# Patient Record
Sex: Male | Born: 1935
Health system: Southern US, Community
[De-identification: ages and names within clinical notes are randomized; demographics above are authoritative.]

## PROBLEM LIST (undated history)

## (undated) DIAGNOSIS — I4891 Unspecified atrial fibrillation: Secondary | ICD-10-CM

## (undated) DIAGNOSIS — D649 Anemia, unspecified: Secondary | ICD-10-CM

## (undated) DIAGNOSIS — N138 Other obstructive and reflux uropathy: Secondary | ICD-10-CM

## (undated) DIAGNOSIS — I2510 Atherosclerotic heart disease of native coronary artery without angina pectoris: Secondary | ICD-10-CM

## (undated) DIAGNOSIS — K4090 Unilateral inguinal hernia, without obstruction or gangrene, not specified as recurrent: Secondary | ICD-10-CM

## (undated) DIAGNOSIS — IMO0002 Reserved for concepts with insufficient information to code with codable children: Secondary | ICD-10-CM

## (undated) DIAGNOSIS — K589 Irritable bowel syndrome without diarrhea: Secondary | ICD-10-CM

## (undated) DIAGNOSIS — K5790 Diverticulosis of intestine, part unspecified, without perforation or abscess without bleeding: Secondary | ICD-10-CM

## (undated) DIAGNOSIS — Z9289 Personal history of other medical treatment: Secondary | ICD-10-CM

## (undated) DIAGNOSIS — I4949 Other premature depolarization: Secondary | ICD-10-CM

## (undated) DIAGNOSIS — E782 Mixed hyperlipidemia: Secondary | ICD-10-CM

## (undated) DIAGNOSIS — R519 Headache, unspecified: Secondary | ICD-10-CM

## (undated) DIAGNOSIS — Z5189 Encounter for other specified aftercare: Secondary | ICD-10-CM

## (undated) DIAGNOSIS — R110 Nausea: Secondary | ICD-10-CM

## (undated) DIAGNOSIS — G8929 Other chronic pain: Secondary | ICD-10-CM

## (undated) DIAGNOSIS — K219 Gastro-esophageal reflux disease without esophagitis: Secondary | ICD-10-CM

## (undated) DIAGNOSIS — R109 Unspecified abdominal pain: Secondary | ICD-10-CM

## (undated) DIAGNOSIS — F419 Anxiety disorder, unspecified: Secondary | ICD-10-CM

## (undated) DIAGNOSIS — R1013 Epigastric pain: Secondary | ICD-10-CM

## (undated) DIAGNOSIS — Z9889 Other specified postprocedural states: Secondary | ICD-10-CM

## (undated) DIAGNOSIS — R112 Nausea with vomiting, unspecified: Secondary | ICD-10-CM

## (undated) DIAGNOSIS — H269 Unspecified cataract: Secondary | ICD-10-CM

## (undated) DIAGNOSIS — N401 Enlarged prostate with lower urinary tract symptoms: Secondary | ICD-10-CM

## (undated) DIAGNOSIS — L439 Lichen planus, unspecified: Secondary | ICD-10-CM

## (undated) DIAGNOSIS — R51 Headache: Secondary | ICD-10-CM

## (undated) HISTORY — DX: Atherosclerotic heart disease of native coronary artery without angina pectoris: I25.10

## (undated) HISTORY — DX: Encounter for other specified aftercare: Z51.89

## (undated) HISTORY — DX: Other obstructive and reflux uropathy: N40.1

## (undated) HISTORY — DX: Headache: R51

## (undated) HISTORY — DX: Unspecified cataract: H26.9

## (undated) HISTORY — DX: Lichen planus, unspecified: L43.9

## (undated) HISTORY — DX: Personal history of other medical treatment: Z92.89

## (undated) HISTORY — DX: Other premature depolarization: I49.49

## (undated) HISTORY — DX: Other obstructive and reflux uropathy: N13.8

## (undated) HISTORY — DX: Unilateral inguinal hernia, without obstruction or gangrene, not specified as recurrent: K40.90

## (undated) HISTORY — DX: Age-related osteoporosis without current pathological fracture: M81.0

## (undated) HISTORY — DX: Anemia, unspecified: D64.9

## (undated) HISTORY — DX: Anxiety disorder, unspecified: F41.9

## (undated) HISTORY — DX: Gastro-esophageal reflux disease without esophagitis: K21.9

## (undated) HISTORY — DX: Mixed hyperlipidemia: E78.2

## (undated) HISTORY — DX: Reserved for concepts with insufficient information to code with codable children: IMO0002

## (undated) HISTORY — DX: Unspecified atrial fibrillation: I48.91

## (undated) HISTORY — DX: Diverticulosis of intestine, part unspecified, without perforation or abscess without bleeding: K57.90

## (undated) HISTORY — DX: Headache, unspecified: R51.9

## (undated) HISTORY — PX: CORONARY ARTERY BYPASS GRAFT: SHX141

## (undated) HISTORY — DX: Irritable bowel syndrome, unspecified: K58.9

---

## 1975-11-28 HISTORY — PX: KNEE SURGERY: SHX244

## 1998-11-16 ENCOUNTER — Encounter (INDEPENDENT_AMBULATORY_CARE_PROVIDER_SITE_OTHER): Payer: Self-pay | Admitting: *Deleted

## 2001-01-20 ENCOUNTER — Encounter: Payer: Self-pay | Admitting: Emergency Medicine

## 2001-01-20 ENCOUNTER — Inpatient Hospital Stay (HOSPITAL_COMMUNITY): Admission: EM | Admit: 2001-01-20 | Discharge: 2001-01-23 | Payer: Self-pay | Admitting: Emergency Medicine

## 2001-01-22 ENCOUNTER — Encounter: Payer: Self-pay | Admitting: Internal Medicine

## 2003-10-07 ENCOUNTER — Encounter: Payer: Self-pay | Admitting: Internal Medicine

## 2004-10-27 ENCOUNTER — Ambulatory Visit: Payer: Self-pay | Admitting: Internal Medicine

## 2004-10-31 ENCOUNTER — Encounter: Admission: RE | Admit: 2004-10-31 | Discharge: 2004-10-31 | Payer: Self-pay | Admitting: Internal Medicine

## 2004-11-27 DIAGNOSIS — L439 Lichen planus, unspecified: Secondary | ICD-10-CM

## 2004-11-27 HISTORY — DX: Lichen planus, unspecified: L43.9

## 2005-06-05 ENCOUNTER — Ambulatory Visit: Payer: Self-pay | Admitting: Internal Medicine

## 2005-06-23 ENCOUNTER — Ambulatory Visit: Payer: Self-pay | Admitting: Internal Medicine

## 2006-04-27 ENCOUNTER — Ambulatory Visit: Payer: Self-pay | Admitting: Internal Medicine

## 2006-05-21 ENCOUNTER — Ambulatory Visit: Payer: Self-pay | Admitting: Internal Medicine

## 2006-05-25 ENCOUNTER — Ambulatory Visit: Payer: Self-pay | Admitting: Internal Medicine

## 2006-06-18 ENCOUNTER — Encounter: Admission: RE | Admit: 2006-06-18 | Discharge: 2006-06-18 | Payer: Self-pay | Admitting: Internal Medicine

## 2006-06-18 ENCOUNTER — Ambulatory Visit: Payer: Self-pay | Admitting: Internal Medicine

## 2006-06-30 ENCOUNTER — Encounter: Admission: RE | Admit: 2006-06-30 | Discharge: 2006-06-30 | Payer: Self-pay | Admitting: Internal Medicine

## 2006-07-09 ENCOUNTER — Ambulatory Visit: Payer: Self-pay | Admitting: Internal Medicine

## 2006-07-18 ENCOUNTER — Ambulatory Visit: Payer: Self-pay | Admitting: Internal Medicine

## 2006-09-19 ENCOUNTER — Ambulatory Visit: Payer: Self-pay | Admitting: Internal Medicine

## 2006-09-19 LAB — CONVERTED CEMR LAB
Basophils Absolute: 0 10*3/uL (ref 0.0–0.1)
Eosinophil percent: 3.5 % (ref 0.0–5.0)
Hemoglobin: 15.2 g/dL (ref 13.0–17.0)
Lymphocytes Relative: 29.9 % (ref 12.0–46.0)
Potassium: 4.3 meq/L (ref 3.5–5.1)
RBC: 4.76 M/uL (ref 4.22–5.81)
RDW: 14.1 % (ref 11.5–14.6)
WBC: 5.8 10*3/uL (ref 4.5–10.5)

## 2006-10-11 ENCOUNTER — Ambulatory Visit: Payer: Self-pay | Admitting: Internal Medicine

## 2007-01-01 DIAGNOSIS — M810 Age-related osteoporosis without current pathological fracture: Secondary | ICD-10-CM

## 2007-03-28 HISTORY — PX: HIP FRACTURE SURGERY: SHX118

## 2007-09-20 ENCOUNTER — Ambulatory Visit: Payer: Self-pay | Admitting: Internal Medicine

## 2007-09-20 DIAGNOSIS — N401 Enlarged prostate with lower urinary tract symptoms: Secondary | ICD-10-CM

## 2007-09-20 DIAGNOSIS — N400 Enlarged prostate without lower urinary tract symptoms: Secondary | ICD-10-CM | POA: Insufficient documentation

## 2007-09-20 DIAGNOSIS — N138 Other obstructive and reflux uropathy: Secondary | ICD-10-CM

## 2007-09-20 DIAGNOSIS — S22009A Unspecified fracture of unspecified thoracic vertebra, initial encounter for closed fracture: Secondary | ICD-10-CM

## 2007-09-25 ENCOUNTER — Ambulatory Visit: Payer: Self-pay | Admitting: Internal Medicine

## 2007-09-25 ENCOUNTER — Encounter (INDEPENDENT_AMBULATORY_CARE_PROVIDER_SITE_OTHER): Payer: Self-pay | Admitting: *Deleted

## 2007-10-04 ENCOUNTER — Ambulatory Visit: Payer: Self-pay | Admitting: Family Medicine

## 2007-10-04 ENCOUNTER — Encounter: Payer: Self-pay | Admitting: Internal Medicine

## 2007-10-12 LAB — CONVERTED CEMR LAB
PSA: 1.63 ng/mL (ref 0.10–4.00)
Rhuematoid fact SerPl-aCnc: <20 intl units/mL — ABNORMAL LOW (ref 0.0–20.0)
Uric Acid, Serum: 5.4 mg/dL (ref 2.4–7.0)

## 2007-10-14 ENCOUNTER — Encounter (INDEPENDENT_AMBULATORY_CARE_PROVIDER_SITE_OTHER): Payer: Self-pay | Admitting: *Deleted

## 2007-11-07 ENCOUNTER — Telehealth (INDEPENDENT_AMBULATORY_CARE_PROVIDER_SITE_OTHER): Payer: Self-pay | Admitting: *Deleted

## 2007-11-25 ENCOUNTER — Ambulatory Visit: Payer: Self-pay | Admitting: Internal Medicine

## 2007-11-29 ENCOUNTER — Encounter (INDEPENDENT_AMBULATORY_CARE_PROVIDER_SITE_OTHER): Payer: Self-pay | Admitting: *Deleted

## 2008-03-30 ENCOUNTER — Inpatient Hospital Stay (HOSPITAL_COMMUNITY): Admission: EM | Admit: 2008-03-30 | Discharge: 2008-04-07 | Payer: Self-pay | Admitting: Emergency Medicine

## 2008-03-31 ENCOUNTER — Telehealth: Payer: Self-pay | Admitting: Internal Medicine

## 2008-05-11 ENCOUNTER — Ambulatory Visit: Payer: Self-pay | Admitting: Internal Medicine

## 2008-05-11 DIAGNOSIS — D649 Anemia, unspecified: Secondary | ICD-10-CM | POA: Insufficient documentation

## 2008-05-11 DIAGNOSIS — R002 Palpitations: Secondary | ICD-10-CM

## 2008-05-12 LAB — CONVERTED CEMR LAB
Basophils Absolute: 0.1 10*3/uL (ref 0.0–0.1)
Eosinophils Absolute: 0.9 10*3/uL — ABNORMAL HIGH (ref 0.0–0.7)
Eosinophils Relative: 10.4 % — ABNORMAL HIGH (ref 0.0–5.0)
HCT: 37.7 % — ABNORMAL LOW (ref 39.0–52.0)
Hemoglobin: 12.9 g/dL — ABNORMAL LOW (ref 13.0–17.0)
Lymphocytes Relative: 21.9 % (ref 12.0–46.0)
MCHC: 34.2 g/dL (ref 30.0–36.0)
MCV: 95.5 fL (ref 78.0–100.0)
Monocytes Absolute: 0.8 10*3/uL (ref 0.1–1.0)
Monocytes Relative: 9.6 % (ref 3.0–12.0)
RDW: 15.5 % — ABNORMAL HIGH (ref 11.5–14.6)

## 2008-05-25 ENCOUNTER — Encounter: Payer: Self-pay | Admitting: Internal Medicine

## 2008-06-27 ENCOUNTER — Encounter: Payer: Self-pay | Admitting: Internal Medicine

## 2008-06-29 ENCOUNTER — Encounter (INDEPENDENT_AMBULATORY_CARE_PROVIDER_SITE_OTHER): Payer: Self-pay | Admitting: *Deleted

## 2008-06-29 ENCOUNTER — Telehealth (INDEPENDENT_AMBULATORY_CARE_PROVIDER_SITE_OTHER): Payer: Self-pay | Admitting: *Deleted

## 2008-06-30 ENCOUNTER — Telehealth (INDEPENDENT_AMBULATORY_CARE_PROVIDER_SITE_OTHER): Payer: Self-pay | Admitting: *Deleted

## 2008-06-30 ENCOUNTER — Ambulatory Visit: Payer: Self-pay | Admitting: Internal Medicine

## 2008-07-06 ENCOUNTER — Encounter (INDEPENDENT_AMBULATORY_CARE_PROVIDER_SITE_OTHER): Payer: Self-pay | Admitting: *Deleted

## 2008-07-06 LAB — CONVERTED CEMR LAB
Eosinophils Relative: 10.9 % — ABNORMAL HIGH (ref 0.0–5.0)
HCT: 44.1 % (ref 39.0–52.0)
Hemoglobin: 14.7 g/dL (ref 13.0–17.0)
MCHC: 33.4 g/dL (ref 30.0–36.0)
MCV: 95.8 fL (ref 78.0–100.0)
Platelets: 201 10*3/uL (ref 150–400)
Vit D, 1,25-Dihydroxy: 36 (ref 30–89)

## 2008-07-07 ENCOUNTER — Encounter: Payer: Self-pay | Admitting: Internal Medicine

## 2008-07-20 ENCOUNTER — Encounter: Payer: Self-pay | Admitting: Internal Medicine

## 2008-07-21 ENCOUNTER — Ambulatory Visit: Payer: Self-pay | Admitting: Internal Medicine

## 2008-09-03 ENCOUNTER — Ambulatory Visit: Payer: Self-pay | Admitting: Internal Medicine

## 2008-09-06 ENCOUNTER — Emergency Department (HOSPITAL_COMMUNITY): Admission: EM | Admit: 2008-09-06 | Discharge: 2008-09-07 | Payer: Self-pay | Admitting: Emergency Medicine

## 2008-09-08 ENCOUNTER — Telehealth (INDEPENDENT_AMBULATORY_CARE_PROVIDER_SITE_OTHER): Payer: Self-pay | Admitting: *Deleted

## 2008-09-09 ENCOUNTER — Telehealth: Payer: Self-pay | Admitting: Internal Medicine

## 2008-09-09 ENCOUNTER — Inpatient Hospital Stay (HOSPITAL_COMMUNITY): Admission: EM | Admit: 2008-09-09 | Discharge: 2008-09-12 | Payer: Self-pay | Admitting: Emergency Medicine

## 2008-09-09 ENCOUNTER — Ambulatory Visit: Payer: Self-pay | Admitting: Internal Medicine

## 2008-09-22 ENCOUNTER — Ambulatory Visit: Payer: Self-pay | Admitting: Internal Medicine

## 2008-09-22 DIAGNOSIS — K219 Gastro-esophageal reflux disease without esophagitis: Secondary | ICD-10-CM

## 2009-06-09 ENCOUNTER — Ambulatory Visit: Payer: Self-pay | Admitting: Internal Medicine

## 2009-06-09 DIAGNOSIS — IMO0002 Reserved for concepts with insufficient information to code with codable children: Secondary | ICD-10-CM | POA: Insufficient documentation

## 2009-06-09 LAB — CONVERTED CEMR LAB
Glucose, Urine, Semiquant: NEGATIVE
Ketones, urine, test strip: NEGATIVE
Nitrite: NEGATIVE
Protein, U semiquant: NEGATIVE
Urobilinogen, UA: 0.2

## 2009-06-10 ENCOUNTER — Telehealth (INDEPENDENT_AMBULATORY_CARE_PROVIDER_SITE_OTHER): Payer: Self-pay | Admitting: *Deleted

## 2009-06-10 ENCOUNTER — Encounter (INDEPENDENT_AMBULATORY_CARE_PROVIDER_SITE_OTHER): Payer: Self-pay | Admitting: *Deleted

## 2009-06-14 ENCOUNTER — Encounter (INDEPENDENT_AMBULATORY_CARE_PROVIDER_SITE_OTHER): Payer: Self-pay | Admitting: *Deleted

## 2009-06-14 ENCOUNTER — Encounter: Admission: RE | Admit: 2009-06-14 | Discharge: 2009-06-14 | Payer: Self-pay | Admitting: Internal Medicine

## 2009-06-17 ENCOUNTER — Telehealth (INDEPENDENT_AMBULATORY_CARE_PROVIDER_SITE_OTHER): Payer: Self-pay | Admitting: *Deleted

## 2009-06-17 ENCOUNTER — Encounter (INDEPENDENT_AMBULATORY_CARE_PROVIDER_SITE_OTHER): Payer: Self-pay | Admitting: *Deleted

## 2009-06-23 ENCOUNTER — Encounter: Payer: Self-pay | Admitting: Internal Medicine

## 2009-06-28 ENCOUNTER — Ambulatory Visit: Payer: Self-pay | Admitting: Internal Medicine

## 2009-06-28 DIAGNOSIS — R0989 Other specified symptoms and signs involving the circulatory and respiratory systems: Secondary | ICD-10-CM

## 2009-06-28 DIAGNOSIS — R531 Weakness: Secondary | ICD-10-CM

## 2009-06-28 DIAGNOSIS — R0609 Other forms of dyspnea: Secondary | ICD-10-CM

## 2009-06-29 ENCOUNTER — Encounter (INDEPENDENT_AMBULATORY_CARE_PROVIDER_SITE_OTHER): Payer: Self-pay | Admitting: *Deleted

## 2009-06-30 LAB — CONVERTED CEMR LAB
Basophils Absolute: 0.1 10*3/uL (ref 0.0–0.1)
Basophils Relative: 2.9 % (ref 0.0–3.0)
Eosinophils Absolute: 0.6 10*3/uL (ref 0.0–0.7)
Eosinophils Relative: 18.1 % — ABNORMAL HIGH (ref 0.0–5.0)
Free T4: 0.9 ng/dL (ref 0.6–1.6)
HCT: 41.7 % (ref 39.0–52.0)
Hemoglobin: 14.3 g/dL (ref 13.0–17.0)
Lymphs Abs: 0.9 10*3/uL (ref 0.7–4.0)
MCV: 96.4 fL (ref 78.0–100.0)
Monocytes Absolute: 1.2 10*3/uL — ABNORMAL HIGH (ref 0.1–1.0)
Monocytes Relative: 35 % — ABNORMAL HIGH (ref 3.0–12.0)
Neutro Abs: 0.6 10*3/uL — ABNORMAL LOW (ref 1.4–7.7)
RBC: 4.33 M/uL (ref 4.22–5.81)
TSH: 1.51 microintl units/mL (ref 0.35–5.50)
WBC: 3.4 10*3/uL — ABNORMAL LOW (ref 4.5–10.5)

## 2009-07-01 ENCOUNTER — Telehealth (INDEPENDENT_AMBULATORY_CARE_PROVIDER_SITE_OTHER): Payer: Self-pay | Admitting: *Deleted

## 2009-07-05 ENCOUNTER — Ambulatory Visit: Payer: Self-pay | Admitting: Internal Medicine

## 2009-07-06 ENCOUNTER — Encounter (INDEPENDENT_AMBULATORY_CARE_PROVIDER_SITE_OTHER): Payer: Self-pay | Admitting: *Deleted

## 2009-07-16 ENCOUNTER — Ambulatory Visit: Payer: Self-pay | Admitting: Internal Medicine

## 2009-08-06 ENCOUNTER — Ambulatory Visit: Payer: Self-pay | Admitting: Internal Medicine

## 2009-08-06 LAB — CONVERTED CEMR LAB
Albumin: 4.1 g/dL (ref 3.5–5.2)
Bilirubin, Direct: 0 mg/dL (ref 0.0–0.3)

## 2009-08-10 ENCOUNTER — Encounter (INDEPENDENT_AMBULATORY_CARE_PROVIDER_SITE_OTHER): Payer: Self-pay | Admitting: *Deleted

## 2009-09-01 ENCOUNTER — Ambulatory Visit: Payer: Self-pay | Admitting: Internal Medicine

## 2009-09-01 DIAGNOSIS — K589 Irritable bowel syndrome without diarrhea: Secondary | ICD-10-CM

## 2009-09-02 ENCOUNTER — Encounter: Payer: Self-pay | Admitting: Internal Medicine

## 2009-09-08 ENCOUNTER — Ambulatory Visit: Payer: Self-pay | Admitting: Internal Medicine

## 2009-09-08 DIAGNOSIS — R198 Other specified symptoms and signs involving the digestive system and abdomen: Secondary | ICD-10-CM

## 2009-11-24 ENCOUNTER — Encounter: Payer: Self-pay | Admitting: Internal Medicine

## 2009-11-24 ENCOUNTER — Ambulatory Visit: Payer: Self-pay | Admitting: Internal Medicine

## 2009-11-27 DIAGNOSIS — K5790 Diverticulosis of intestine, part unspecified, without perforation or abscess without bleeding: Secondary | ICD-10-CM

## 2009-11-27 HISTORY — DX: Diverticulosis of intestine, part unspecified, without perforation or abscess without bleeding: K57.90

## 2009-12-13 ENCOUNTER — Ambulatory Visit: Payer: Self-pay | Admitting: Internal Medicine

## 2009-12-13 DIAGNOSIS — S72009A Fracture of unspecified part of neck of unspecified femur, initial encounter for closed fracture: Secondary | ICD-10-CM | POA: Insufficient documentation

## 2009-12-14 ENCOUNTER — Ambulatory Visit: Payer: Self-pay | Admitting: Internal Medicine

## 2009-12-15 ENCOUNTER — Encounter: Payer: Self-pay | Admitting: Internal Medicine

## 2009-12-16 LAB — CONVERTED CEMR LAB
Basophils Absolute: 0.1 10*3/uL (ref 0.0–0.1)
Basophils Relative: 1.5 % (ref 0.0–3.0)
Eosinophils Absolute: 0.4 10*3/uL (ref 0.0–0.7)
Hemoglobin: 14.1 g/dL (ref 13.0–17.0)
Lymphocytes Relative: 28.4 % (ref 12.0–46.0)
MCHC: 33 g/dL (ref 30.0–36.0)
Monocytes Relative: 9.7 % (ref 3.0–12.0)
Neutro Abs: 3.3 10*3/uL (ref 1.4–7.7)
Neutrophils Relative %: 54.6 % (ref 43.0–77.0)
RBC: 4.38 M/uL (ref 4.22–5.81)
RDW: 14.8 % — ABNORMAL HIGH (ref 11.5–14.6)
TSH: 2.32 microintl units/mL (ref 0.35–5.50)
Testosterone: 500.96 ng/dL (ref 350.00–890.00)
Vit D, 25-Hydroxy: 52 ng/mL (ref 30–89)

## 2009-12-17 ENCOUNTER — Encounter: Payer: Self-pay | Admitting: Internal Medicine

## 2009-12-22 LAB — CONVERTED CEMR LAB
Beta, Urine: DETECTED % — AB
Free Kappa/Lambda Ratio: 14.29 — ABNORMAL HIGH (ref 0.46–4.00)
Free Lambda Lt Chains,Ur: 0.07 mg/dL — ABNORMAL LOW (ref 0.08–1.01)
Gamma Globulin, Urine: DETECTED % — AB
Time: 24
Volume, Urine: 1400 mL

## 2010-02-08 ENCOUNTER — Ambulatory Visit: Payer: Self-pay | Admitting: Internal Medicine

## 2010-02-08 DIAGNOSIS — R079 Chest pain, unspecified: Secondary | ICD-10-CM

## 2010-02-08 DIAGNOSIS — I4949 Other premature depolarization: Secondary | ICD-10-CM

## 2010-02-09 ENCOUNTER — Encounter: Payer: Self-pay | Admitting: Internal Medicine

## 2010-02-14 ENCOUNTER — Telehealth (INDEPENDENT_AMBULATORY_CARE_PROVIDER_SITE_OTHER): Payer: Self-pay | Admitting: *Deleted

## 2010-02-15 ENCOUNTER — Encounter (HOSPITAL_COMMUNITY): Admission: RE | Admit: 2010-02-15 | Discharge: 2010-04-27 | Payer: Self-pay | Admitting: Internal Medicine

## 2010-02-15 ENCOUNTER — Ambulatory Visit: Payer: Self-pay | Admitting: Cardiovascular Disease

## 2010-02-15 ENCOUNTER — Encounter: Payer: Self-pay | Admitting: Cardiology

## 2010-02-15 ENCOUNTER — Encounter: Payer: Self-pay | Admitting: Cardiovascular Disease

## 2010-02-15 ENCOUNTER — Ambulatory Visit: Payer: Self-pay

## 2010-02-15 ENCOUNTER — Encounter (INDEPENDENT_AMBULATORY_CARE_PROVIDER_SITE_OTHER): Payer: Self-pay | Admitting: *Deleted

## 2010-02-15 LAB — CONVERTED CEMR LAB
Basophils Relative: 0.7 % (ref 0.0–3.0)
Calcium: 10.1 mg/dL (ref 8.4–10.5)
Creatinine, Ser: 0.7 mg/dL (ref 0.4–1.5)
Eosinophils Absolute: 0.2 10*3/uL (ref 0.0–0.7)
Eosinophils Relative: 3.4 % (ref 0.0–5.0)
GFR calc non Af Amer: 116.11 mL/min (ref >60)
Hemoglobin: 14.7 g/dL (ref 13.0–17.0)
INR: 1 (ref 0.8–1.0)
Lymphocytes Relative: 24.3 % (ref 12.0–46.0)
Monocytes Relative: 8.6 % (ref 3.0–12.0)
Neutrophils Relative %: 63 % (ref 43.0–77.0)
RBC: 4.57 M/uL (ref 4.22–5.81)
Sodium: 142 meq/L (ref 135–145)
WBC: 7.2 10*3/uL (ref 4.5–10.5)

## 2010-02-17 ENCOUNTER — Ambulatory Visit: Payer: Self-pay | Admitting: Internal Medicine

## 2010-02-17 ENCOUNTER — Inpatient Hospital Stay (HOSPITAL_BASED_OUTPATIENT_CLINIC_OR_DEPARTMENT_OTHER): Admission: RE | Admit: 2010-02-17 | Discharge: 2010-02-17 | Payer: Self-pay | Admitting: Cardiovascular Disease

## 2010-02-17 ENCOUNTER — Inpatient Hospital Stay (HOSPITAL_COMMUNITY): Admission: AD | Admit: 2010-02-17 | Discharge: 2010-03-08 | Payer: Self-pay | Admitting: Cardiovascular Disease

## 2010-02-17 ENCOUNTER — Encounter: Payer: Self-pay | Admitting: Cardiovascular Disease

## 2010-02-19 ENCOUNTER — Ambulatory Visit: Payer: Self-pay | Admitting: Cardiothoracic Surgery

## 2010-02-20 ENCOUNTER — Encounter: Payer: Self-pay | Admitting: Cardiovascular Disease

## 2010-02-25 DIAGNOSIS — I2510 Atherosclerotic heart disease of native coronary artery without angina pectoris: Secondary | ICD-10-CM | POA: Insufficient documentation

## 2010-03-02 ENCOUNTER — Encounter (INDEPENDENT_AMBULATORY_CARE_PROVIDER_SITE_OTHER): Payer: Self-pay | Admitting: *Deleted

## 2010-03-10 ENCOUNTER — Encounter: Payer: Self-pay | Admitting: Cardiovascular Disease

## 2010-03-15 ENCOUNTER — Ambulatory Visit: Payer: Self-pay | Admitting: Cardiovascular Disease

## 2010-03-15 DIAGNOSIS — I4891 Unspecified atrial fibrillation: Secondary | ICD-10-CM

## 2010-03-21 ENCOUNTER — Encounter: Payer: Self-pay | Admitting: Internal Medicine

## 2010-03-21 ENCOUNTER — Encounter: Admission: RE | Admit: 2010-03-21 | Discharge: 2010-03-21 | Payer: Self-pay | Admitting: Cardiothoracic Surgery

## 2010-03-21 ENCOUNTER — Ambulatory Visit: Payer: Self-pay | Admitting: Cardiothoracic Surgery

## 2010-03-22 ENCOUNTER — Telehealth (INDEPENDENT_AMBULATORY_CARE_PROVIDER_SITE_OTHER): Payer: Self-pay | Admitting: *Deleted

## 2010-03-23 ENCOUNTER — Encounter: Payer: Self-pay | Admitting: Internal Medicine

## 2010-03-23 ENCOUNTER — Telehealth: Payer: Self-pay | Admitting: Cardiovascular Disease

## 2010-03-28 ENCOUNTER — Ambulatory Visit: Payer: Self-pay | Admitting: Cardiothoracic Surgery

## 2010-03-28 ENCOUNTER — Encounter: Admission: RE | Admit: 2010-03-28 | Discharge: 2010-03-28 | Payer: Self-pay | Admitting: Cardiothoracic Surgery

## 2010-03-29 ENCOUNTER — Ambulatory Visit: Payer: Self-pay | Admitting: Cardiology

## 2010-03-29 LAB — CONVERTED CEMR LAB: POC INR: 1.8

## 2010-04-04 ENCOUNTER — Ambulatory Visit: Payer: Self-pay | Admitting: Internal Medicine

## 2010-04-05 ENCOUNTER — Ambulatory Visit: Payer: Self-pay | Admitting: Cardiovascular Disease

## 2010-04-05 LAB — CONVERTED CEMR LAB: POC INR: 1.6

## 2010-04-12 ENCOUNTER — Ambulatory Visit: Payer: Self-pay | Admitting: Internal Medicine

## 2010-04-12 LAB — CONVERTED CEMR LAB: POC INR: 1.6

## 2010-04-14 ENCOUNTER — Encounter: Payer: Self-pay | Admitting: Cardiovascular Disease

## 2010-04-14 ENCOUNTER — Encounter (HOSPITAL_COMMUNITY): Admission: RE | Admit: 2010-04-14 | Discharge: 2010-07-13 | Payer: Self-pay | Admitting: Cardiovascular Disease

## 2010-04-18 ENCOUNTER — Ambulatory Visit: Payer: Self-pay | Admitting: Cardiovascular Disease

## 2010-04-18 ENCOUNTER — Ambulatory Visit: Payer: Self-pay | Admitting: Cardiology

## 2010-04-18 DIAGNOSIS — E782 Mixed hyperlipidemia: Secondary | ICD-10-CM

## 2010-04-29 ENCOUNTER — Ambulatory Visit: Payer: Self-pay | Admitting: Cardiovascular Disease

## 2010-05-05 ENCOUNTER — Encounter: Payer: Self-pay | Admitting: Cardiovascular Disease

## 2010-05-06 ENCOUNTER — Ambulatory Visit: Payer: Self-pay | Admitting: Cardiovascular Disease

## 2010-05-13 ENCOUNTER — Encounter: Payer: Self-pay | Admitting: Cardiovascular Disease

## 2010-05-18 ENCOUNTER — Encounter: Payer: Self-pay | Admitting: Cardiovascular Disease

## 2010-05-18 ENCOUNTER — Ambulatory Visit: Payer: Self-pay | Admitting: Cardiology

## 2010-05-18 ENCOUNTER — Ambulatory Visit: Payer: Self-pay | Admitting: Cardiovascular Disease

## 2010-05-18 LAB — CONVERTED CEMR LAB: POC INR: 1.8

## 2010-05-20 ENCOUNTER — Ambulatory Visit: Payer: Self-pay | Admitting: Cardiovascular Disease

## 2010-05-31 ENCOUNTER — Encounter (INDEPENDENT_AMBULATORY_CARE_PROVIDER_SITE_OTHER): Payer: Self-pay | Admitting: *Deleted

## 2010-05-31 LAB — CONVERTED CEMR LAB
AST: 25 units/L (ref 0–37)
Alkaline Phosphatase: 50 units/L (ref 39–117)
Bilirubin, Direct: 0.2 mg/dL (ref 0.0–0.3)
Cholesterol: 131 mg/dL (ref 0–200)
LDL Cholesterol: 46 mg/dL (ref 0–99)
Total CHOL/HDL Ratio: 2

## 2010-06-15 ENCOUNTER — Ambulatory Visit: Payer: Self-pay | Admitting: Family Medicine

## 2010-07-14 ENCOUNTER — Encounter (HOSPITAL_COMMUNITY): Admission: RE | Admit: 2010-07-14 | Discharge: 2010-07-28 | Payer: Self-pay | Admitting: Cardiovascular Disease

## 2010-07-25 ENCOUNTER — Encounter: Payer: Self-pay | Admitting: Cardiovascular Disease

## 2010-10-18 ENCOUNTER — Ambulatory Visit: Payer: Self-pay | Admitting: Internal Medicine

## 2010-10-19 ENCOUNTER — Encounter: Payer: Self-pay | Admitting: Internal Medicine

## 2010-10-20 ENCOUNTER — Encounter: Payer: Self-pay | Admitting: Internal Medicine

## 2010-10-21 LAB — CONVERTED CEMR LAB
AST: 28 units/L (ref 0–37)
Albumin: 3.9 g/dL (ref 3.5–5.2)
Basophils Absolute: 0.1 10*3/uL (ref 0.0–0.1)
CO2: 30 meq/L (ref 19–32)
Calcium: 9.6 mg/dL (ref 8.4–10.5)
Chloride: 103 meq/L (ref 96–112)
Eosinophils Absolute: 0.3 10*3/uL (ref 0.0–0.7)
HCT: 39.6 % (ref 39.0–52.0)
Hemoglobin: 13.3 g/dL (ref 13.0–17.0)
Lymphs Abs: 1.9 10*3/uL (ref 0.7–4.0)
MCHC: 33.5 g/dL (ref 30.0–36.0)
Monocytes Absolute: 0.9 10*3/uL (ref 0.1–1.0)
Neutro Abs: 3.2 10*3/uL (ref 1.4–7.7)
Platelets: 165 10*3/uL (ref 150.0–400.0)
Potassium: 5.1 meq/L (ref 3.5–5.1)
RDW: 15.6 % — ABNORMAL HIGH (ref 11.5–14.6)
Sodium: 142 meq/L (ref 135–145)

## 2010-10-28 ENCOUNTER — Ambulatory Visit: Payer: Self-pay | Admitting: Gastroenterology

## 2010-10-28 ENCOUNTER — Ambulatory Visit: Payer: Self-pay | Admitting: Cardiovascular Disease

## 2010-10-28 ENCOUNTER — Telehealth: Payer: Self-pay | Admitting: Gastroenterology

## 2010-11-27 HISTORY — PX: INGUINAL HERNIA REPAIR: SUR1180

## 2010-12-21 ENCOUNTER — Ambulatory Visit
Admission: RE | Admit: 2010-12-21 | Discharge: 2010-12-21 | Payer: Self-pay | Source: Home / Self Care | Attending: Internal Medicine | Admitting: Internal Medicine

## 2010-12-21 LAB — CONVERTED CEMR LAB
Nitrite: NEGATIVE
Urobilinogen, UA: 0.2

## 2010-12-22 ENCOUNTER — Encounter: Payer: Self-pay | Admitting: Internal Medicine

## 2010-12-22 LAB — CONVERTED CEMR LAB
Bacteria, UA: NONE SEEN
Casts: NONE SEEN /lpf

## 2010-12-26 ENCOUNTER — Telehealth (INDEPENDENT_AMBULATORY_CARE_PROVIDER_SITE_OTHER): Payer: Self-pay | Admitting: *Deleted

## 2010-12-28 ENCOUNTER — Encounter: Payer: Self-pay | Admitting: Nurse Practitioner

## 2010-12-28 ENCOUNTER — Ambulatory Visit (INDEPENDENT_AMBULATORY_CARE_PROVIDER_SITE_OTHER): Payer: MEDICARE | Admitting: Nurse Practitioner

## 2010-12-28 ENCOUNTER — Encounter: Payer: Self-pay | Admitting: Gastroenterology

## 2010-12-28 DIAGNOSIS — R1084 Generalized abdominal pain: Secondary | ICD-10-CM

## 2010-12-28 DIAGNOSIS — Z1211 Encounter for screening for malignant neoplasm of colon: Secondary | ICD-10-CM

## 2010-12-29 NOTE — Medication Information (Signed)
Summary: rov/sp  Anticoagulant Therapy  Managed by: Bethena Midget, RN, BSN Referring MD: Dr. Charlton Haws PCP: Dr. Marga Melnick Supervising MD: Excell Seltzer MD, Casimiro Needle Indication 1: Atrial Fibrillation Indication 2: Atrial Fibrillation Lab Used: [facility] [place] Site: [address] INR POC 1.2 INR RANGE 2.0-3.0  Dietary changes: no    Health status changes: no    Bleeding/hemorrhagic complications: no    Recent/future hospitalizations: no    Any changes in medication regimen? no    Recent/future dental: no  Any missed doses?: no       Is patient compliant with meds? yes       Allergies: 1)  ! Amoxicillin  Anticoagulation Management History:      The patient is taking warfarin and comes in today for a routine follow up visit.  Positive risk factors for bleeding include an age of 75 years or older.  The bleeding index is 'intermediate risk'.  Positive CHADS2 values include Age > 28 years old.  His last INR was 1.0 ratio.  Anticoagulation responsible provider: Excell Seltzer MD, Casimiro Needle.  INR POC: 1.2.  Cuvette Lot#: [id number].  Exp: 06/2011.    Anticoagulation Management Assessment/Plan:      The patient's current anticoagulation dose is Coumadin 5 mg tabs: as directed.  The target INR is 2.0-3.0.  The next INR is due 05/06/2010.  Anticoagulation instructions were given to patient.  Results were reviewed/authorized by Bethena Midget, RN, BSN.  He was notified by Bethena Midget, RN, BSN.         Prior Anticoagulation Instructions: INR 1.9  Take 1 1/2 tablets today then resume same dose of 1 tablet every day except 1 1/2 tablets on Tuesday, Thursday and Saturday  Current Anticoagulation Instructions: INR 1.2 Today take 1 1/2 pills then change dose to 1 1/2 pills everyday except 1 pill on Wednesdays and Fridays. Recheck in one week.

## 2010-12-29 NOTE — Assessment & Plan Note (Signed)
Summary: per check out/sf   Primary Provider:  Dr. Marga Melnick  CC:  check up.  History of Present Illness: [name] is seen today post CABG.  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  Post op course was complicated by afib that was hard to control.  He was sent home on Dig, BB and Multaq  His appetite is still poor.  He denies palpitatoins, PND, orthopnea.  Post op CXR shows no effusion.  Sternum and LE's are well healed.  He is in NSR today with a QT of 386.  He denies SSCP, fever or syncope.  Since he is in sinus and relatively brady with good LV we stopped  his multaq and digoxen last vist.   He is maint NSR and we can stop his coumadin at this point.  He will get a lipid and liver at the [place] office next week.  Current Medications (verified): 1)  Vitamin D3 1000 Unit Tabs (Cholecalciferol) .... Take 1 Tablet By Mouth Once A Day 2)  Aspirin Ec 81 Mg Tbec (Aspirin) .... Take 1 Tablet By Mouth Every Morning 3)  Tums E-X 750 Mg Chew (Calcium Carbonate Antacid) .... As Needed 4)  Multivitamins   Tabs (Multiple Vitamin) .[name].. 1 Tab By Mouth Once Daily 5)  Fiber 625 Mg Tabs (Calcium Polycarbophil) .[name].. 1 Tab By Mouth Once Daily 6)  Ranitidine Hcl 150 Mg Tabs (Ranitidine Hcl) .[name].. 1 Two Times A Day Pre Meals As Needed 7)  Folic Acid 1 Mg Tabs (Folic Acid) .[name].. 1 By Mouth Daily 8)  Crestor 10 Mg Tabs (Rosuvastatin Calcium) .[name].. 1 By Mouth Daily 9)  Metoprolol Tartrate 25 Mg Tabs (Metoprolol Tartrate) .... Take One Tablet By Mouth Twice A Day  Allergies (verified): 1)  ! Amoxicillin  Past History:  Past Medical History: Last updated: 02/25/2010 Current Problems:  CHEST PAIN (ICD-786.50) PALPITATIONS (ICD-785.1) CAD (ICD-414.00) PREMATURE VENTRICULAR CONTRACTIONS (ICD-427.69) VERTEBRAL FRACTURE, THORACIC SPINE (ICD-805.2) HIP FRACTURE, RIGHT (ICD-820.8) CHANGE IN BOWELS (ICD-787.99) IBS (ICD-564.1) FATIGUE (ICD-780.79) DYSPNEA/SHORTNESS OF BREATH  (ICD-786.09) LUMBAR RADICULOPATHY, RIGHT (ICD-724.4) ESOPHAGEAL REFLUX (ICD-530.81) UNSPECIFIED ANEMIA (ICD-285.9) PAIN IN JOINT, UNSPECIFIED SITE (ICD-719.40) HYPERPLASIA, PROSTATE NOS W/URINARY OBST/LUTS (ICD-600.91) FX CLOSED DORSAL VERTEBRA (ICD-805.2) OSTEOPOROSIS (ICD-733.00)Vertebral fractures , nontraumatic  2008; R hip fracture 03/2008  Past Surgical History: Last updated: 02/25/2010 Hip/femur fracture post fall , Dr Erin Sons, S/P pinning    Right knee surgery many years ago.    Fall with right hip fracture repair in 2009.   Compression fracture of the lumbar spine 3-4 years ago.      Family History: Last updated: 02/15/2010 non-contributory  Social History: Last updated: 02/15/2010 Married Active Non-smoker Non-drinker  Review of Systems       Denies fever, malais, weight loss, blurry vision, decreased visual acuity, cough, sputum, SOB, hemoptysis, pleuritic pain, palpitaitons, heartburn, abdominal pain, melena, lower extremity edema, claudication, or rash.   Vital Signs:  Patient profile:   77 year old male Height:      78 inches Weight:      149 pounds BMI:     17.28 Pulse rate:   60 / minute Resp:     14 per minute BP sitting:   105 / 62  (left arm)  Vitals Entered By: Kem Parkinson (May 18, 2010 8:39 AM)  Physical Exam  General:  Affect appropriate Healthy:  appears stated age HEENT: normal Neck supple with no adenopathy JVP normal no bruits no thyromegaly Lungs clear with  no wheezing and good diaphragmatic motion Heart:  S1/S2 no murmur,rub, gallop or click PMI normal Abdomen: benighn, BS positve, no tenderness, no AAA no bruit.  No HSM or HJR Distal pulses intact with no bruits No edema Neuro non-focal Skin warm and dry    Impression & Recommendations:  Problem # 1:  MIXED HYPERLIPIDEMIA (ICD-272.2)  Labs next week.  Target LDL 70 His updated medication list for this problem includes:    Crestor 10 Mg Tabs (Rosuvastatin  calcium) .[name]... 1 by mouth daily  His updated medication list for this problem includes:    Crestor 10 Mg Tabs (Rosuvastatin calcium) .[name]... 1 by mouth daily  Problem # 2:  ATRIAL FIBRILLATION (ICD-427.31)  Maint NSR.  D/C coumadin.   The following medications were removed from the medication list:    Coumadin 5 Mg Tabs (Warfarin sodium) .[name]... As directed His updated medication list for this problem includes:    Aspirin Ec 81 Mg Tbec (Aspirin) .[name]... Take 1 tablet by mouth every morning    Metoprolol Tartrate 25 Mg Tabs (Metoprolol tartrate) .[name]... Take one tablet by mouth twice a day  The following medications were removed from the medication list:    Coumadin 5 Mg Tabs (Warfarin sodium) .[name]... As directed His updated medication list for this problem includes:    Aspirin Ec 81 Mg Tbec (Aspirin) .[name]... Take 1 tablet by mouth every morning    Metoprolol Tartrate 25 Mg Tabs (Metoprolol tartrate) .[name]... Take one tablet by mouth twice a day  Problem # 3:  CAD (ICD-414.00)  S/P CABG  Recovered well  Adult ASA and BB The following medications were removed from the medication list:    Coumadin 5 Mg Tabs (Warfarin sodium) .[name]... As directed His updated medication list for this problem includes:    Aspirin Ec 81 Mg Tbec (Aspirin) .[name]... Take 1 tablet by mouth every morning    Metoprolol Tartrate 25 Mg Tabs (Metoprolol tartrate) .[name]... Take one tablet by mouth twice a day  The following medications were removed from the medication list:    Coumadin 5 Mg Tabs (Warfarin sodium) .[name]... As directed His updated medication list for this problem includes:    Aspirin Ec 81 Mg Tbec (Aspirin) .[name]... Take 1 tablet by mouth every morning    Metoprolol Tartrate 25 Mg Tabs (Metoprolol tartrate) .[name]... Take one tablet by mouth twice a day  Patient Instructions: 1)  Your physician recommends that you schedule a follow-up appointment in: 6 MONTHS 2)  Your physician recommends that you return for a FASTING lipid  profile: NEXT WEEK 3)  Your physician has recommended you make the following change in your medication: STOP COUMADIN 4)  INCREASE ASPIRIN TO 325MG  ONCE DAILY Prescriptions: CRESTOR 10 MG TABS (ROSUVASTATIN CALCIUM) 1 by mouth daily  #30 x 12   Entered by:   Kem Parkinson   Authorized by:   Colon Branch, MD, [facility]   Signed by:   Kem Parkinson on 05/18/2010   Method used:   Electronically to        [facility]* (retail)       [address]       [place], [place]  [id number]       Ph: [phone]       Fax: [phone]   RxID:   [id number]

## 2010-12-29 NOTE — Letter (Signed)
Summary: [facility]   Imported By: Lanelle Bal 03/28/2010 12:08:30  _____________________________________________________________________  External Attachment:    Type:   Image     Comment:   External Document

## 2010-12-29 NOTE — Assessment & Plan Note (Signed)
Summary: s/p hosp/saf   History of Present Illness: [name] is seen today post hospital D/C for CABG.  Post op course complicated by afib now on coumadin with good rate control.  At [facility] until Saturday.  INR this week 2.1  No SSCP, dyspnea, palpitations or syncope.  Compliant with meds.  No fever wounds healing well.  Discussed with [name].  Still in afib.  Check INR and 2 weeks.  DCC in 4-5 weeks if he does not convert spontaneously  Current Problems (verified): 1)  Chest Pain  (ICD-786.50) 2)  Palpitations  (ICD-785.1) 3)  Cad  (ICD-414.00) 4)  Premature Ventricular Contractions  (ICD-427.69) 5)  Vertebral Fracture, Thoracic Spine  (ICD-805.2) 6)  Hip Fracture, Right  (ICD-820.8) 7)  Change in Bowels  (ICD-787.99) 8)  Ibs  (ICD-564.1) 9)  Fatigue  (ICD-780.79) 10)  Dyspnea/shortness of Breath  (ICD-786.09) 11)  Lumbar Radiculopathy, Right  (ICD-724.4) 12)  Esophageal Reflux  (ICD-530.81) 13)  Unspecified Anemia  (ICD-285.9) 14)  Pain in Joint, Unspecified Site  (ICD-719.40) 15)  Hyperplasia, Prostate Nos W/urinary Obst/luts  (ICD-600.91) 16)  Fx Closed Dorsal Vertebra  (ICD-805.2) 17)  Osteoporosis  (ICD-733.00)  Current Medications (verified): 1)  Vitamin D3 1000 Unit Tabs (Cholecalciferol) .... Take 1 Tablet By Mouth Once A Day 2)  Aspirin Ec 81 Mg Tbec (Aspirin) .... Take 1 Tablet By Mouth Every Morning 3)  Tums E-X 750 Mg Chew (Calcium Carbonate Antacid) .... As Needed 4)  Multivitamins   Tabs (Multiple Vitamin) .[name].. 1 Tab By Mouth Once Daily 5)  Fiber 625 Mg Tabs (Calcium Polycarbophil) .[name].. 1 Tab By Mouth Once Daily 6)  Ranitidine Hcl 150 Mg Tabs (Ranitidine Hcl) .[name].. 1 Two Times A Day Pre Meals 7)  Digoxin 0.25 Mg Tabs (Digoxin) .[name].. 1 By Mouth Daily 8)  Multaq 400 Mg Tabs (Dronedarone Hcl) .[name].. 1 By Mouth Two Times A Day 9)  Folic Acid 1 Mg Tabs (Folic Acid) .[name].. 1 By Mouth Daily 10)  Metoprolol Succinate 25 Mg Xr24h-Tab (Metoprolol Succinate) .[name].. 1 By Mouth Every 8  Hours 11)  Crestor 10 Mg Tabs (Rosuvastatin Calcium) .[name].. 1 By Mouth Daily 12)  Coumadin 5 Mg Tabs (Warfarin Sodium) .... As Directed 13)  Ultram 50 Mg Tabs (Tramadol Hcl) .... As Needed  Allergies (verified): 1)  ! Amoxicillin  Past History:  Past Medical History: Last updated: 02/25/2010 Current Problems:  CHEST PAIN (ICD-786.50) PALPITATIONS (ICD-785.1) CAD (ICD-414.00) PREMATURE VENTRICULAR CONTRACTIONS (ICD-427.69) VERTEBRAL FRACTURE, THORACIC SPINE (ICD-805.2) HIP FRACTURE, RIGHT (ICD-820.8) CHANGE IN BOWELS (ICD-787.99) IBS (ICD-564.1) FATIGUE (ICD-780.79) DYSPNEA/SHORTNESS OF BREATH (ICD-786.09) LUMBAR RADICULOPATHY, RIGHT (ICD-724.4) ESOPHAGEAL REFLUX (ICD-530.81) UNSPECIFIED ANEMIA (ICD-285.9) PAIN IN JOINT, UNSPECIFIED SITE (ICD-719.40) HYPERPLASIA, PROSTATE NOS W/URINARY OBST/LUTS (ICD-600.91) FX CLOSED DORSAL VERTEBRA (ICD-805.2) OSTEOPOROSIS (ICD-733.00)Vertebral fractures , nontraumatic  2008; R hip fracture 03/2008  Past Surgical History: Last updated: 02/25/2010 Hip/femur fracture post fall , Dr Erin Sons, S/P pinning    Right knee surgery many years ago.    Fall with right hip fracture repair in 2009.   Compression fracture of the lumbar spine 3-4 years ago.      Family History: Last updated: 02/15/2010 non-contributory  Social History: Last updated: 02/15/2010 Married Active Non-smoker Non-drinker  Review of Systems       Denies fever, malais, weight loss, blurry vision, decreased visual acuity, cough, sputum, SOB, hemoptysis, pleuritic pain, palpitaitons, heartburn, abdominal pain, melena, lower extremity edema, claudication, or rash.   Vital Signs:  Patient profile:   77 year old male Height:  76 inches Weight:      155 pounds BMI:     18.94 Pulse rate:   69 / minute Pulse rhythm:   irregularly irregular Resp:     18 per minute BP sitting:   110 / 70  (right arm)  Vitals Entered By: Marrion Coy, CNA (March 15, 2010 9:08  AM)  Physical Exam  General:  Affect appropriate Healthy:  appears stated age HEENT: normal Neck supple with no adenopathy JVP normal no bruits no thyromegaly Lungs clear with no wheezing and good diaphragmatic motion Heart:  S1/S2 no murmur,rub, gallop or click PMI normal Abdomen: benighn, BS positve, no tenderness, no AAA no bruit.  No HSM or HJR Distal pulses intact with no bruits No edema Neuro non-focal Skin warm and dry    Impression & Recommendations:  Problem # 1:  CHEST PAIN (ICD-786.50) S/P CABG with normal LV continue ASA and BB His updated medication list for this problem includes:    Aspirin Ec 81 Mg Tbec (Aspirin) .[name]... Take 1 tablet by mouth every morning    Metoprolol Succinate 25 Mg Xr24h-tab (Metoprolol succinate) .[name]... 1 by mouth every 8 hours    Coumadin 5 Mg Tabs (Warfarin sodium) .[name]... As directed  Problem # 2:  ATRIAL FIBRILLATION (ICD-427.31) Continue rate control and anticoagulaiton.  DCC in 4 weeks His updated medication list for this problem includes:    Aspirin Ec 81 Mg Tbec (Aspirin) .[name]... Take 1 tablet by mouth every morning    Digoxin 0.25 Mg Tabs (Digoxin) .[name]... 1 by mouth daily    Multaq 400 Mg Tabs (Dronedarone hcl) .[name]... 1 by mouth two times a day    Metoprolol Succinate 25 Mg Xr24h-tab (Metoprolol succinate) .[name]... 1 by mouth every 8 hours    Coumadin 5 Mg Tabs (Warfarin sodium) .[name]... As directed  Other Orders: EKG w/ Interpretation (93000)  Patient Instructions: 1)  Your physician recommends that you schedule a follow-up appointment in: CVRR in 2 weeks --NEW PT  427.31-- anticipate cardioversion if still in at fib in 4 weeks when he sees Dr Eden Emms in follow-up 2)  Appointment with Dr Eden Emms in 4 weeks.    EKG Report  Procedure date:  03/15/2010  Findings:      Afib 72 [id number] Nonspecific ST/T wave changes QT 380

## 2010-12-29 NOTE — Assessment & Plan Note (Signed)
Summary: sinus headache//kn   Vital Signs:  Patient profile:   77 year old male Height:      78 inches Weight:      152 pounds Temp:     98.0 degrees F oral BP sitting:   118 / 72  (left arm)  Vitals Entered By: Jeremy Johann CMA (June 15, 2010 11:57 AM) CC: sinus headache, drainage, runny nose, URI symptoms   History of Present Illness:       This is a 77 year old man who presents with URI symptoms.  The symptoms began 1 week ago.  Pt here with his wife c/o sinus headache and congestion.  The patient complains of nasal congestion, purulent nasal discharge, earache, and sick contacts, but denies clear nasal discharge, dry cough, and productive cough.  The patient denies fever, low-grade fever (<100.5 degrees), fever of 100.5-103 degrees, fever of 103.1-104 degrees, fever to >104 degrees, stiff neck, dyspnea, wheezing, rash, vomiting, diarrhea, use of an antipyretic, and response to antipyretic.  The patient also reports headache.  The patient denies itchy watery eyes, itchy throat, sneezing, seasonal symptoms, response to antihistamine, muscle aches, and severe fatigue.  The patient denies the following risk factors for Strep sinusitis: unilateral facial pain, unilateral nasal discharge, poor response to decongestant, double sickening, tooth pain, Strep exposure, tender adenopathy, and absence of cough.    Current Medications (verified): 1)  Vitamin D3 1000 Unit Tabs (Cholecalciferol) .... Take 1 Tablet By Mouth Once A Day 2)  Aspirin Ec 81 Mg Tbec (Aspirin) .... Take 1 Tablet By Mouth Every Morning 3)  Tums E-X 750 Mg Chew (Calcium Carbonate Antacid) .... As Needed 4)  Multivitamins   Tabs (Multiple Vitamin) .[name].. 1 Tab By Mouth Once Daily 5)  Fiber 625 Mg Tabs (Calcium Polycarbophil) .[name].. 1 Tab By Mouth Once Daily 6)  Ranitidine Hcl 150 Mg Tabs (Ranitidine Hcl) .[name].. 1 Two Times A Day Pre Meals As Needed 7)  Folic Acid 1 Mg Tabs (Folic Acid) .[name].. 1 By Mouth Daily 8)  Crestor 10 Mg Tabs  (Rosuvastatin Calcium) .[name].. 1 By Mouth Daily 9)  Metoprolol Tartrate 25 Mg Tabs (Metoprolol Tartrate) .... Take One Tablet By Mouth Twice A Day 10)  Warfarin Sodium 5 Mg Tabs (Warfarin Sodium) .... Use As Directed By Anticoagulation Clinic  Allergies (verified): 1)  ! Amoxicillin  Past History:  Past medical, surgical, family and social histories (including risk factors) reviewed for relevance to current acute and chronic problems.  Past Medical History: Reviewed history from 02/25/2010 and no changes required. Current Problems:  CHEST PAIN (ICD-786.50) PALPITATIONS (ICD-785.1) CAD (ICD-414.00) PREMATURE VENTRICULAR CONTRACTIONS (ICD-427.69) VERTEBRAL FRACTURE, THORACIC SPINE (ICD-805.2) HIP FRACTURE, RIGHT (ICD-820.8) CHANGE IN BOWELS (ICD-787.99) IBS (ICD-564.1) FATIGUE (ICD-780.79) DYSPNEA/SHORTNESS OF BREATH (ICD-786.09) LUMBAR RADICULOPATHY, RIGHT (ICD-724.4) ESOPHAGEAL REFLUX (ICD-530.81) UNSPECIFIED ANEMIA (ICD-285.9) PAIN IN JOINT, UNSPECIFIED SITE (ICD-719.40) HYPERPLASIA, PROSTATE NOS W/URINARY OBST/LUTS (ICD-600.91) FX CLOSED DORSAL VERTEBRA (ICD-805.2) OSTEOPOROSIS (ICD-733.00)Vertebral fractures , nontraumatic  2008; R hip fracture 03/2008  Past Surgical History: Reviewed history from 02/25/2010 and no changes required. Hip/femur fracture post fall , Dr Erin Sons, S/P pinning    Right knee surgery many years ago.    Fall with right hip fracture repair in 2009.   Compression fracture of the lumbar spine 3-4 years ago.      Family History: Reviewed history from 02/15/2010 and no changes required. non-contributory  Social History: Reviewed history from 02/15/2010 and no changes required. Married Active Non-smoker Non-drinker  Review of Systems  See HPI  Physical Exam  General:  Well-developed,well-nourished,in no acute distress; alert,appropriate and cooperative throughout examination Ears:  + dull--+ fluid b/l Nose:  no external erythema  and L frontal sinus tenderness.   Mouth:  pharyngeal erythema and postnasal drip.   Neck:  No deformities, masses, or tenderness noted. Lungs:  Normal respiratory effort, chest expands symmetrically. Lungs are clear to auscultation, no crackles or wheezes. Psych:  Oriented X3 and normally interactive.     Impression & Recommendations:  Problem # 1:  SINUSITIS - ACUTE-NOS (ICD-461.9)  His updated medication list for this problem includes:    Ceftin 500 Mg Tabs (Cefuroxime axetil) .[name]... 1 by mouth two times a day    Veramyst 27.5 Mcg/spray Susp (Fluticasone furoate) .[name]... 2 sprays each nostril once daily     clarinex 1 by mouth once daily as needed   Instructed on treatment. Call if symptoms persist or worsen.   Orders: Prescription Created Electronically [phone])  Complete Medication List: 1)  Vitamin D3 1000 Unit Tabs (Cholecalciferol) .... Take 1 tablet by mouth once a day 2)  Aspirin 325 Mg Tabs (Aspirin) .... Take 1 tab once daily 3)  Tums E-x 750 Mg Chew (Calcium carbonate antacid) .... As needed 4)  Multivitamins Tabs (Multiple vitamin) .[name].. 1 tab by mouth once daily 5)  Fiber 625 Mg Tabs (Calcium polycarbophil) .[name].. 1 tab by mouth once daily 6)  Ranitidine Hcl 150 Mg Tabs (Ranitidine hcl) .[name].. 1 two times a day pre meals as needed 7)  Folic Acid 1 Mg Tabs (Folic acid) .[name].. 1 by mouth daily 8)  Crestor 10 Mg Tabs (Rosuvastatin calcium) .[name].. 1 by mouth daily 9)  Metoprolol Tartrate 25 Mg Tabs (Metoprolol tartrate) .... Take one tablet by mouth twice a day 10)  Ceftin 500 Mg Tabs (Cefuroxime axetil) .[name].. 1 by mouth two times a day 11)  Veramyst 27.5 Mcg/spray Susp (Fluticasone furoate) .... 2 sprays each nostril once daily 12)  Clarinex 5 Mg Tabs (Desloratadine) .[name].. 1 by mouth once daily as needed  Patient Instructions: 1)  Acute sinusitis symptoms for less than 10 days are not helped by antibiotics. Use warm moist compresses, and over the counter decongestants( only as  directed). Call if no improvement in 5-7 days, sooner if increasing pain, fever, or new symptoms.  2)  Take clarinex 1 by mouth a day for sinus drainage as needed. 3)  Veramyst 2 sprays each nostril once daily. Prescriptions: VERAMYST 27.5 MCG/SPRAY SUSP (FLUTICASONE FUROATE) 2 sprays each nostril once daily  #1 x 0   Entered and Authorized by:   Loreen Freud DO   Signed by:   Loreen Freud DO on 06/15/2010   Method used:   Historical   RxID:   [id number] CEFTIN 500 MG TABS (CEFUROXIME AXETIL) 1 by mouth two times a day  #20 x 0   Entered and Authorized by:   Loreen Freud DO   Signed by:   Loreen Freud DO on 06/15/2010   Method used:   Electronically to        [facility]* (retail)       [address]       [place], [place]  [id number]       Ph: [phone]       Fax: [phone]   RxID:   [id number]

## 2010-12-29 NOTE — Miscellaneous (Signed)
Summary: [facility]  [facility]   Imported By: Lanelle Bal 04/19/2010 12:07:01  _____________________________________________________________________  External Attachment:    Type:   Image     Comment:   External Document

## 2010-12-29 NOTE — Assessment & Plan Note (Addendum)
Summary: Diarrhea/LRH   History of Present Illness Visit Type: Initial Consult Primary GI MD: Melvia Heaps MD [facility] Primary Provider: Dr. Marga Melnick Chief Complaint: watery stools which is better now History of Present Illness:   Patient was last seen by Dr. Victorino Dike in 1999. He was referred here for evaluation of diarrhea and LLQ pain. Patient has chronic, intermittent diarrhea but this recent episode was associated with LLQ pain and since that was new,  he went to see PCP 10/18/10. At the time of his visit with Dr. Alwyn Ren patient had already had 5-7 loose BMs that morning. Stool O&P, giardia, cryptosporidium, C-Diff toxin, c&s all negative. Awaiting fecal fat study. CBC, CMET unremarkable. Given Lomotil.   Had diarrhea like this last year, Align helped so patient started taking it again and over the last few days the diarrhea has improved. Stools are solidifying, yesterday had totol of 4 BMs.   Lost weight in March after CABG. He is regaining the weight .[name]    GI Review of Systems    Reports abdominal pain.     Location of  Abdominal pain: LLQ.    Denies acid reflux, belching, bloating, chest pain, dysphagia with liquids, dysphagia with solids, heartburn, loss of appetite, nausea, vomiting, vomiting blood, weight loss, and  weight gain.      Reports change in bowel habits and  diarrhea.     Denies anal fissure, black tarry stools, constipation, diverticulosis, fecal incontinence, heme positive stool, hemorrhoids, irritable bowel syndrome, jaundice, light color stool, liver problems, rectal bleeding, and  rectal pain. Preventive Screening-Counseling & Management      Drug Use:  no.      Current Medications (verified): 1)  Vitamin D3 1000 Unit Tabs (Cholecalciferol) .... Take 1 Tablet By Mouth Once A Day 2)  Aspirin 325 Mg Tabs (Aspirin) .... Take 1 Tab Once Daily 3)  Tums E-X 750 Mg Chew (Calcium Carbonate Antacid) .... As Needed 4)  Multivitamins   Tabs (Multiple Vitamin) .[name].. 1  Tab By Mouth Once Daily 5)  Fiber 625 Mg Tabs (Calcium Polycarbophil) .[name].. 1 Tab By Mouth Once Daily (On Hold) 6)  Ranitidine Hcl 150 Mg Tabs (Ranitidine Hcl) .[name].. 1 Two Times A Day Pre Meals As Needed 7)  Folic Acid 1 Mg Tabs (Folic Acid) .[name].. 1 By Mouth Daily 8)  Crestor 10 Mg Tabs (Rosuvastatin Calcium) .[name].. 1 By Mouth Daily 9)  Metoprolol Tartrate 25 Mg Tabs (Metoprolol Tartrate) .... Take One Tablet By Mouth Twice A Day 10)  Align  Caps (Probiotic Product) .[name].. 1 By Mouth Once Daily As Needed  Allergies (verified): 1)  ! Amoxicillin  Past History:  Past Medical History: Reviewed history from 10/18/2010 and no changes required. CHEST PAIN (ICD-786.50) CAD (ICD-414.00) PREMATURE VENTRICULAR CONTRACTIONS (ICD-427.69) VERTEBRAL FRACTURE, THORACIC SPINE (ICD-805.2) HIP FRACTURE, RIGHT (ICD-820.8) CHANGE IN BOWELS (ICD-787.99) IBS (ICD-564.1) FATIGUE (ICD-780.79) DYSPNEA/SHORTNESS OF BREATH (ICD-786.09) LUMBAR RADICULOPATHY, RIGHT (ICD-724.4) ESOPHAGEAL REFLUX (ICD-530.81) UNSPECIFIED ANEMIA (ICD-285.9) PAIN IN JOINT, UNSPECIFIED SITE (ICD-719.40) HYPERPLASIA, PROSTATE NOS W/URINARY OBST/LUTS (ICD-600.91) FX CLOSED DORSAL VERTEBRA (ICD-805.2) OSTEOPOROSIS (ICD-733.00)Vertebral fractures , nontraumatic  2008; R hip fracture 03/2008  Past Surgical History: Reviewed history from 10/18/2010 and no changes required. Hip/femur fracture post fall , Dr Erin Sons, S/P pinning   Right knee surgery  remotely  Fall with right hip fracture repair in 2009.  Compression fracture of the lumbar spine      Family History: Reviewed history from 02/15/2010 and no changes required. non-contributory No FH of Colon Cancer:  Family History of Breast Cancer:sister Family History of Heart Disease: father, mother and sister  Social History: Reviewed history from 02/15/2010 and no changes required. Married Active Non-smoker quit Non-drinker Illicit Drug Use - no Drug Use:  no  Review of  Systems       The patient complains of back pain, hearing problems, and urination - excessive.  The patient denies allergy/sinus, anemia, anxiety-new, arthritis/joint pain, blood in urine, breast changes/lumps, change in vision, confusion, cough, coughing up blood, depression-new, fainting, fatigue, fever, headaches-new, heart murmur, heart rhythm changes, itching, muscle pains/cramps, night sweats, nosebleeds, shortness of breath, skin rash, sleeping problems, sore throat, swelling of feet/legs, swollen lymph glands, thirst - excessive, urination changes/pain, urine leakage, vision changes, and voice change.    Vital Signs:  Patient profile:   77 year old male Height:      78 inches Weight:      159 pounds BMI:     18.44 Pulse rate:   68 / minute Pulse rhythm:   regular BP sitting:   110 / 62  (left arm)  Vitals Entered By: Milford Cage NCMA (October 28, 2010 2:54 PM)  Physical Exam  General:  Well developed, well nourished, no acute distress. Head:  Normocephalic and atraumatic. Eyes:  Conjunctiva pink, no icterus.  Neck:  no obvious masses  Lungs:  Clear throughout to auscultation. Heart:  Regular rate and rhythm; no murmurs, rubs,  or bruits. Abdomen:  Abdomen soft, nontender, nondistended. No obvious masses or hepatomegaly.Normal bowel sounds.  Rectal:  No external or internal lesion. Scant light brown stool, heme negative Msk:  Symmetrical with no gross deformities. Normal posture. Extremities:  No palmar erythema, no edema.  Neurologic:  Alert and  oriented x4;  grossly normal neurologically. Skin:  Intact without significant lesions or rashes. Cervical Nodes:  No significant cervical adenopathy. Psych:  Alert and cooperative. Normal mood and affect.   Impression & Recommendations:  Problem # 1:  DIARRHEA (ICD-787.91) Assessment Comment Only Chronic, intermittent diarrhea with normal bowel movements in between. His recent episode of diarrhea was associated with LLQ  discomfort, prompting visit to PCP.  Stool studies, labs negative.The pain resolved several days ago and diarrhea has improved with Lomotil and Align.  Patient may have underlying IBS. His abdominal examination is benign. Recommend completing the 28 days of Align. He may use either Lomotil or Immodium twice daily. If BMs don't return to baseline or he has recurrent LLQ pain, patient will call our office.   Problem # 2:  SCREENING COLORECTAL-CANCER (ICD-V76.51) Assessment: Comment Only Last colonoscopy 1999 -Normal except for diverticular changes and small hemorrhoids. We sent a recall letter out in 2005 (not sure why he was on a 6 year recall ??) but procedure wasn't done. Patient wished to wait until his wife is out of rehab (she fell on Thanksgiving). He will call us in Jan. to schedule the procedure.  Patient Instructions: 1)  1.) Since you are feeling better, continue packet of Align.  2)  2.) May use Lomotil or Immodium twice daily (not both). 3)  3.) If pain and / or diarrhea returns, call our office. Otherwise, 4)      call us in January to schedule a screening colonoscopy  5)    6)  Copy sent to : Dr Marga Melnick  Appended Document: Diarrhea/LRH Pam, patient was recalled for colonoscopy but wife was in rehab and he wanted to postpone. Can you call him and see if ready to schedule or  is it still bad time for him. Will obviously need a previsit since it has been several weeks since he was here. thanks  Appended Document: Diarrhea/LRH LM for pt's husband Beau to call us regarding scheduling his Colonoscopy.   Appended Document: Diarrhea/LRH Phone note open on pt.

## 2010-12-29 NOTE — Letter (Signed)
[facility]   Imported By: Marylou Mccoy 06/14/2010 13:08:19  _____________________________________________________________________  External Attachment:    Type:   Image     Comment:   External Document

## 2010-12-29 NOTE — Letter (Signed)
Summary: [facility]   Imported By: Lanelle Bal 03/29/2010 11:10:22  _____________________________________________________________________  External Attachment:    Type:   Image     Comment:   External Document

## 2010-12-29 NOTE — Cardiovascular Report (Signed)
Summary: Pre Cath Orders   Pre Cath Orders   Imported By: Roderic Ovens 02/25/2010 12:05:23  _____________________________________________________________________  External Attachment:    Type:   Image     Comment:   External Document

## 2010-12-29 NOTE — Medication Information (Signed)
Summary: Coumadin Clinic  Anticoagulant Therapy  Managed by: Inactive Referring MD: Dr. Charlton Haws PCP: Dr. Marga Melnick Supervising MD: Eden Emms MD, Theron Arista Indication 1: Atrial Fibrillation Indication 2: Atrial Fibrillation Lab Used: [facility] [place] Site: [address] INR RANGE 2.0-3.0          Comments: Saw Dr Eden Emms today, coumadin discontinued  Allergies: 1)  ! Amoxicillin  Anticoagulation Management History:      Positive risk factors for bleeding include an age of 75 years or older.  The bleeding index is 'intermediate risk'.  Positive CHADS2 values include Age > 75 years old.  His last INR was 1.0 ratio.  Anticoagulation responsible provider: Eden Emms MD, Theron Arista.  Exp: 07/2011.    Anticoagulation Management Assessment/Plan:      The patient's current anticoagulation dose is Warfarin sodium 5 mg tabs: Use as directed by Anticoagulation Clinic.  The target INR is 2.0-3.0.  The next INR is due 06/03/2010.  Anticoagulation instructions were given to patient.  Results were reviewed/authorized by Inactive.         Prior Anticoagulation Instructions: INR 1.8 Today take 2 pills then change dose to 1 1/2 pills everyday except 2 pills on Sundays. Recheck in 2 weeks.

## 2010-12-29 NOTE — Miscellaneous (Signed)
Summary: [facility]  [facility]   Imported By: Roderic Ovens 05/19/2010 10:21:14  _____________________________________________________________________  External Attachment:    Type:   Image     Comment:   External Document

## 2010-12-29 NOTE — Progress Notes (Signed)
Summary: Nuclear Pre-Procedure  Phone Note Outgoing Call   Call placed by: Milana Na, EMT-P,  February 14, 2010 3:22 PM Summary of Call: Left message with information on Myoview Information Sheet (see scanned document for details).      Nuclear Med Background Indications for Stress Test: Evaluation for Ischemia     Symptoms: Chest Pain, Chest Pain with Exertion, Chest Pressure, Chest Pressure with Exertion    Nuclear Pre-Procedure Cardiac Risk Factors: Family History - CAD  Nuclear Med Study Referring MD:  W.Hopper

## 2010-12-29 NOTE — Progress Notes (Signed)
Summary: Refill Request  Phone Note Refill Request Call back at [phone] Message from:  Pharmacy on March 23, 2010 8:57 AM  Refills Requested: Medication #1:  COUMADIN 5 MG TABS as directed   Dosage confirmed as above?Dosage Confirmed All other meds were refilled by Dr. Eden Emms, except this one.   Initial call taken by: Harold Barban,  March 23, 2010 8:57 AM  Follow-up for Phone Call        Called spoke with pt taking 5mg  tablets, 1 tablet daily since d/c from [facility] on Sat 04/18/10.  Has pending [facility] appt on 03/29/10.  Advised pt rx refill sent to pharmacy as requested. Follow-up by: Cloyde Reams RN,  March 23, 2010 10:02 AM    Prescriptions: COUMADIN 5 MG TABS (WARFARIN SODIUM) as directed  #30 x 1   Entered by:   Cloyde Reams RN   Authorized by:   Colon Branch, MD, [facility]   Signed by:   Cloyde Reams RN on 03/23/2010   Method used:   Electronically to        [facility]* (retail)       [address]       [place], [place]  [id number]       Ph: [phone]       Fax: [phone]   RxID:   [id number]

## 2010-12-29 NOTE — Assessment & Plan Note (Signed)
Summary: 6 mo f/u ./cy   Primary Provider:  Dr. Marga Melnick  CC:  check up.  History of Present Illness: [name] is seen today post CABG.  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  Post op course was complicated by afib that was hard to control.  He was sent home on Dig, BB and Multaq   He converted and has maintained NSR.   Needs a F/U colonoscopy  Has had watery stools with negative w/u.  Some weight loss and irregular BM's.    Current Problems (verified): 1)  Diarrhea  (ICD-787.91) 2)  Sinusitis - Acute-nos  (ICD-461.9) 3)  Mixed Hyperlipidemia  (ICD-272.2) 4)  Atrial Fibrillation  (ICD-427.31) 5)  Chest Pain  (ICD-786.50) 6)  Palpitations  (ICD-785.1) 7)  Cad  (ICD-414.00) 8)  Premature Ventricular Contractions  (ICD-427.69) 9)  Vertebral Fracture, Thoracic Spine  (ICD-805.2) 10)  Hip Fracture, Right  (ICD-820.8) 11)  Change in Bowels  (ICD-787.99) 12)  Ibs  (ICD-564.1) 13)  Fatigue  (ICD-780.79) 14)  Dyspnea/shortness of Breath  (ICD-786.09) 15)  Lumbar Radiculopathy, Right  (ICD-724.4) 16)  Esophageal Reflux  (ICD-530.81) 17)  Unspecified Anemia  (ICD-285.9) 18)  Pain in Joint, Unspecified Site  (ICD-719.40) 19)  Hyperplasia, Prostate Nos W/urinary Obst/luts  (ICD-600.91) 20)  Fx Closed Dorsal Vertebra  (ICD-805.2) 21)  Osteoporosis  (ICD-733.00)  Current Medications (verified): 1)  Vitamin D3 1000 Unit Tabs (Cholecalciferol) .... Take 1 Tablet By Mouth Once A Day 2)  Aspirin 325 Mg Tabs (Aspirin) .... Take 1 Tab Once Daily 3)  Tums E-X 750 Mg Chew (Calcium Carbonate Antacid) .... As Needed 4)  Multivitamins   Tabs (Multiple Vitamin) .[name].. 1 Tab By Mouth Once Daily 5)  Fiber 625 Mg Tabs (Calcium Polycarbophil) .[name].. 1 Tab By Mouth Once Daily 6)  Ranitidine Hcl 150 Mg Tabs (Ranitidine Hcl) .[name].. 1 Two Times A Day Pre Meals As Needed 7)  Folic Acid 1 Mg Tabs (Folic Acid) .[name].. 1 By Mouth Daily 8)  Crestor 10 Mg Tabs (Rosuvastatin Calcium) .[name].. 1 By  Mouth Daily 9)  Metoprolol Tartrate 25 Mg Tabs (Metoprolol Tartrate) .... Take One Tablet By Mouth Twice A Day 10)  Align  Caps (Probiotic Product) .[name].. 1 By Mouth Once Daily As Needed  Allergies (verified): 1)  ! Amoxicillin  Past History:  Past Medical History: Last updated: 10/18/2010 CHEST PAIN (ICD-786.50) CAD (ICD-414.00) PREMATURE VENTRICULAR CONTRACTIONS (ICD-427.69) VERTEBRAL FRACTURE, THORACIC SPINE (ICD-805.2) HIP FRACTURE, RIGHT (ICD-820.8) CHANGE IN BOWELS (ICD-787.99) IBS (ICD-564.1) FATIGUE (ICD-780.79) DYSPNEA/SHORTNESS OF BREATH (ICD-786.09) LUMBAR RADICULOPATHY, RIGHT (ICD-724.4) ESOPHAGEAL REFLUX (ICD-530.81) UNSPECIFIED ANEMIA (ICD-285.9) PAIN IN JOINT, UNSPECIFIED SITE (ICD-719.40) HYPERPLASIA, PROSTATE NOS W/URINARY OBST/LUTS (ICD-600.91) FX CLOSED DORSAL VERTEBRA (ICD-805.2) OSTEOPOROSIS (ICD-733.00)Vertebral fractures , nontraumatic  2008; R hip fracture 03/2008  Past Surgical History: Last updated: 10/18/2010 Hip/femur fracture post fall , Dr Erin Sons, S/P pinning   Right knee surgery  remotely  Fall with right hip fracture repair in 2009.  Compression fracture of the lumbar spine      Family History: Last updated: 02/15/2010 non-contributory  Social History: Last updated: 02/15/2010 Married Active Non-smoker Non-drinker  Review of Systems       Denies fever, malais, weight loss, blurry vision, decreased visual acuity, cough, sputum, SOB, hemoptysis, pleuritic pain, palpitaitons, heartburn, abdominal pain, melena, lower extremity edema, claudication, or rash.   Vital Signs:  Patient profile:   77 year old male Height:      78 inches Weight:  159 pounds BMI:     18.44 Pulse rate:   55 / minute Resp:     14 per minute BP sitting:   128 / 66  (left arm)  Vitals Entered By: Kem Parkinson (October 28, 2010 9:12 AM)  Physical Exam  General:  Affect appropriate Healthy:  appears stated age HEENT: normal Neck supple with  no adenopathy JVP normal no bruits no thyromegaly Lungs clear with no wheezing and good diaphragmatic motion Heart:  S1/S2 no murmur,rub, gallop or click PMI normal Abdomen: benighn, BS positve, no tenderness, no AAA no bruit.  No HSM or HJR Distal pulses intact with no bruits No edema Neuro non-focal Skin warm and dry    Impression & Recommendations:  Problem # 1:  DIARRHEA (ICD-787.91) Refer to GI for colonoscopy Orders: Gastroenterology Referral (GI)  Problem # 2:  MIXED HYPERLIPIDEMIA (ICD-272.2) At goal with no side effects His updated medication list for this problem includes:    Crestor 10 Mg Tabs (Rosuvastatin calcium) .[name]... 1 by mouth daily  CHOL: 131 (05/20/2010)   LDL: 46 (05/20/2010)   HDL: 61.70 (05/20/2010)   TG: 117.0 (05/20/2010)  Problem # 3:  CAD (ICD-414.00) Stable post CABG no angina His updated medication list for this problem includes:    Aspirin 325 Mg Tabs (Aspirin) .[name]... Take 1 tab once daily    Metoprolol Tartrate 25 Mg Tabs (Metoprolol tartrate) .[name]... Take one tablet by mouth twice a day  Patient Instructions: 1)  Your physician recommends that you schedule a follow-up appointment in: 6 MONTHS WITH DR Laurence Slate 2)  Your physician recommends that you continue on your current medications as directed. Please refer to the Current Medication list given to you today. 3)  You have been referred to GI DX DIARRHEA EVAULATE FOR POSSIBLE COLONOSCOPY

## 2010-12-29 NOTE — Miscellaneous (Signed)
Summary: [facility]  [facility]   Imported By: Roderic Ovens 05/26/2010 16:41:42  _____________________________________________________________________  External Attachment:    Type:   Image     Comment:   External Document

## 2010-12-29 NOTE — Assessment & Plan Note (Signed)
Summary: Cardiology Nuclear Study  Nuclear Med Background Indications for Stress Test: Evaluation for Ischemia    History Comments: NO DOCUMENTED CAD  Symptoms: Chest Pain, Chest Pain with Exertion, Chest Pressure, Chest Pressure with Exertion, Fatigue, Palpitations  Symptoms Comments: Last episode of CP:2 days ago.   Nuclear Pre-Procedure Cardiac Risk Factors: Family History - CAD, History of Smoking, RBBB Caffeine/Decaff Intake: None NPO After: 6:00 PM Lungs: Clear.  O2 Sat 98% on RA. IV 0.9% NS with Angio Cath: 20g     IV Site: (R) Forearm IV Started by: Irean Hong RN Chest Size (in) 40     Height (in): 76 Weight (lb): 155 BMI: 18.94  Nuclear Med Study 1 or 2 day study:  1 day     Stress Test Type:  Eugenie Birks Reading MD:  Charlton Haws, MD     Referring MD:  Marga Melnick, MD Resting Radionuclide:  Technetium 65m Tetrofosmin     Resting Radionuclide Dose:  11 mCi  Stress Radionuclide:  Technetium 35m Tetrofosmin     Stress Radionuclide Dose:  33 mCi   Stress Protocol   Lexiscan: 0.4 mg   Stress Test Technologist:  [employer]a [employer] CMA-N     Nuclear Technologist:  Domenic Polite CNMT  Rest Procedure  Myocardial perfusion imaging was performed at rest 45 minutes following the intravenous administration of Myoview Technetium 35m Tetrofosmin.  Stress Procedure  The patient received IV Lexiscan 0.4 mg over 15-seconds.  Myoview injected at 30-seconds.  There was a hypotensive response to lexiscan; no diagnostic changes, occasional PVC's, PAC's.  He did c/o chest pressure with lexiscan.  Quantitative spect images were obtained after a 45 minute delay.  QPS Raw Data Images:  Normal; no motion artifact; normal heart/lung ratio. Stress Images:  Inferior apical defect Rest Images:  Normal homogeneous uptake in all areas of the myocardium. Subtraction (SDS):  inferior ischemia Transient Ischemic Dilatation:  1  (Normal <1.22)  Lung/Heart Ratio:  .29  (Normal  <0.45)  Quantitative Gated Spect Images QGS EDV:  114 ml QGS ESV:  46 ml QGS EF:  60 % QGS cine images:  normal  Findings High risk nuclear study Evidence for inferior ischemia      Overall Impression  Exercise Capacity: Lexiscan BP Response: Normal blood pressure response. Clinical Symptoms: No chest pain ECG Impression: No significant ST segment change suggestive of ischemia. Overall Impression: Marked inferior, apical and septal ischemia Overall Impression Comments: Seen by Dr Eden Emms same day and cath arranged for Thursday morning

## 2010-12-29 NOTE — Letter (Signed)
Summary: [facility]  [facility]   Imported By: Marylou Mccoy 04/26/2010 14:21:43  _____________________________________________________________________  External Attachment:    Type:   Image     Comment:   External Document

## 2010-12-29 NOTE — Medication Information (Signed)
Summary: rov/tm  Anticoagulant Therapy  Managed by: Weston Brass, PharmD Referring MD: Dr. Charlton Haws PCP: Dr. Marga Melnick Supervising MD: Clifton James MD, Cristal Deer Indication 1: Atrial Fibrillation Indication 2: Atrial Fibrillation Lab Used: [facility]  Site: [address] INR POC 1.5 INR RANGE 2.0-3.0  Dietary changes: no    Health status changes: no    Bleeding/hemorrhagic complications: no    Recent/future hospitalizations: no    Any changes in medication regimen? no    Recent/future dental: no  Any missed doses?: no       Is patient compliant with meds? yes       Allergies: 1)  ! Amoxicillin  Anticoagulation Management History:      The patient is taking warfarin and comes in today for a routine follow up visit.  Positive risk factors for bleeding include an age of 75 years or older.  The bleeding index is 'intermediate risk'.  Positive CHADS2 values include Age > 41 years old.  His last INR was 1.0 ratio.  Anticoagulation responsible provider: Clifton James MD, Cristal Deer.  INR POC: 1.5.  Cuvette Lot#: [id number].  Exp: 06/2011.    Anticoagulation Management Assessment/Plan:      The patient's current anticoagulation dose is Coumadin 5 mg tabs: as directed.  The target INR is 2.0-3.0.  The next INR is due 05/18/2010.  Anticoagulation instructions were given to patient.  Results were reviewed/authorized by Weston Brass, PharmD.  He was notified by Weston Brass PharmD.         Prior Anticoagulation Instructions: INR 1.2 Today take 1 1/2 pills then change dose to 1 1/2 pills everyday except 1 pill on Wednesdays and Fridays. Recheck in one week.   Current Anticoagulation Instructions: INR 1.5  Take 2 tablets today and tomorrow then increase dose to 1 1/2 tablets every day.  Recheck in 1 week.

## 2010-12-29 NOTE — Assessment & Plan Note (Signed)
Summary: DISCUSS OSTEOPOROSIS ASAP/RH.....   Vital Signs:  Patient profile:   77 year old male Weight:      156.2 pounds Pulse rate:   54 / minute Resp:     17 per minute BP sitting:   110 / 62  (left arm) Cuff size:   regular  Vitals Entered By: Shonna Chock (December 13, 2009 11:50 AM) CC: Follow-up visit: Discuss Bone Density Comments REVIEWED MED LIST, PATIENT AGREED DOSE AND INSTRUCTION CORRECT    CC:  Follow-up visit: Discuss Bone Density.  History of Present Illness: BMD reviewed : - 4.6 T score R hip, but that hip was pinned. Other sites reveal Osteopenia.Vitamin D was 35 in 05/2009. TUMS 1 two times a day.S/P vertebral fractures. R hip fracture on Fosamax < 2 years ; " Dr Margreta Journey was against it".  Allergies: 1)  ! Amoxicillin  Past History:  Past Medical History: Osteoporosis; Vertebral fractures , nontraumatic  2008; R hip fracture 03/2008  Past Surgical History: Hip/femur fracture post fall , Dr Erin Sons, S/P pinning   Physical Exam  General:  Thin,in no acute distress; alert,appropriate and cooperative throughout examination Extremities:  No clubbing, cyanosis, edema, or deformity noted with normal full range of motion of all joints. OA of hands. Crepitus of knees    Impression & Recommendations:  Problem # 1:  OSTEOPOROSIS (ICD-733.00) S/P vertebral & R hip fractures; R hip data on 10/2009 BMD probably invalid due to surgery @ that site  Problem # 2:  HIP FRACTURE, RIGHT (ICD-820.8) patient believes bisphosphonate therapy may increase fracture risk  Problem # 3:  VERTEBRAL FRACTURE, THORACIC SPINE (ICD-805.2)  Complete Medication List: 1)  Vitamin D3 1000 Unit Tabs (Cholecalciferol) .... Take 1 tablet by mouth once a day 2)  Aspirin Ec 81 Mg Tbec (Aspirin) .... Take 1 tablet by mouth every morning 3)  Tums E-x 750 Mg Chew (Calcium carbonate antacid) .... Take 4)  Multivitamin  5)  Fiber  6)  Librax 2.5-5 Mg Caps (Clidinium-chlordiazepoxide) .[name].. 1  q 6 hrs as needed cramps  Patient Instructions: 1)  Fasting labs: Vitamin D level, Testosterone,urine for Bence Jones Protein,calcium, 2)  TSH ; 3)  CBC w/ Diff . See Diagnoses for Codes. I'll have BMD reviewed concerning  validity of R hip values

## 2010-12-29 NOTE — Assessment & Plan Note (Signed)
Summary: NP3/DM   History of Present Illness: [name] is seen today at the request of Dr Alwyn Ren.  He was at the office today for a myovue.  He has been having epigastric and SSCP for about a week.  He has pain with exertion expecially moving th lawn.  He has not had prolonged epidsode or resting pain.  He was noted to have PVC's in the past but no CAD.  He is not diabetic.  I reviewed his myovue from today and it was markedly abnormal.  He had inferior, apical and septal ischemia.  EF 60%.  I discussed the results with him and recommended heart cath.  His resting pulse is only 52 and he is on ASA.  We will call in nitro but not start him on Plavix since he may have multivessel disease.  His Allens test was normal in the office and we will initially attempt to do him from the right radial artery.  Current Problems (verified): 1)  Premature Ventricular Contractions  (ICD-427.69) 2)  Chest Pain  (ICD-786.50) 3)  Vertebral Fracture, Thoracic Spine  (ICD-805.2) 4)  Hip Fracture, Right  (ICD-820.8) 5)  Change in Bowels  (ICD-787.99) 6)  Ibs  (ICD-564.1) 7)  Fatigue  (ICD-780.79) 8)  Dyspnea/shortness of Breath  (ICD-786.09) 9)  Lumbar Radiculopathy, Right  (ICD-724.4) 10)  Esophageal Reflux  (ICD-530.81) 11)  Unspecified Anemia  (ICD-285.9) 12)  Palpitations  (ICD-785.1) 13)  Pain in Joint, Unspecified Site  (ICD-719.40) 14)  Hyperplasia, Prostate Nos W/urinary Obst/luts  (ICD-600.91) 15)  Fx Closed Dorsal Vertebra  (ICD-805.2) 16)  Osteoporosis  (ICD-733.00)  Current Medications (verified): 1)  Vitamin D3 1000 Unit Tabs (Cholecalciferol) .... Take 1 Tablet By Mouth Once A Day 2)  Aspirin Ec 81 Mg Tbec (Aspirin) .... Take 1 Tablet By Mouth Every Morning 3)  Tums E-X 750 Mg Chew (Calcium Carbonate Antacid) .... As Needed 4)  Multivitamins   Tabs (Multiple Vitamin) .[name].. 1 Tab By Mouth Once Daily 5)  Fiber 625 Mg Tabs (Calcium Polycarbophil) .[name].. 1 Tab By Mouth Once Daily 6)  Ranitidine Hcl 150 Mg  Tabs (Ranitidine Hcl) .[name].. 1 Two Times A Day Pre Meals  Allergies (verified): 1)  ! Amoxicillin  Past History:  Past Medical History: Last updated: 12/13/2009 Osteoporosis; Vertebral fractures , nontraumatic  2008; R hip fracture 03/2008  Past Surgical History: Last updated: 12/13/2009 Hip/femur fracture post fall , Dr Erin Sons, S/P pinning   Family History: Last updated: 02/15/2010 non-contributory  Social History: Last updated: 02/15/2010 Married Active Non-smoker Non-drinker  Family History: non-contributory  Social History: Married Active Non-smoker Non-drinker  Review of Systems       Denies fever, malais, weight loss, blurry vision, decreased visual acuity, cough, sputum, SOB, hemoptysis, pleuritic pain, palpitaitons, heartburn, abdominal pain, melena, lower extremity edema, claudication, or rash.   Vital Signs:  Patient profile:   77 year old male Weight:      161 pounds Pulse rate:   58 / minute Resp:     16 per minute BP sitting:   118 / 70  (left arm)  Vitals Entered By: Kem Parkinson (February 15, 2010 2:19 PM)  Physical Exam  General:  Affect appropriate Healthy:  appears stated age HEENT: normal Neck supple with no adenopathy JVP normal no bruits no thyromegaly Lungs clear with no wheezing and good diaphragmatic motion Heart:  S1/S2 no murmur,rub, gallop or click PMI normal Abdomen: benighn, BS positve, no tenderness, no AAA no bruit.  No HSM or HJR  Distal pulses intact with no bruits No edema Neuro non-focal Skin warm and dry Allens test normal in both hands   Impression & Recommendations:  Problem # 1:  CHEST PAIN (ICD-786.50) Wtih markedly abnormal myovue Nitro called in Cath Thursday.  To ER for any recurrence before then. His updated medication list for this problem includes:    Aspirin Ec 81 Mg Tbec (Aspirin) .[name]... Take 1 tablet by mouth every morning  Orders: TLB-BMP (Basic Metabolic Panel-BMET) (80048-METABOL) TLB-CBC  Platelet - w/Differential (85025-CBCD) TLB-PT (Protime) (85610-PTP) Cardiac Catheterization (Cardiac Cath) T-2 View CXR (71020TC)  Problem # 2:  PREMATURE VENTRICULAR CONTRACTIONS (ICD-427.69) Normal EF.  No malignant or symptomatic VT. May be related to ischemia.  Cath Thursday His updated medication list for this problem includes:    Aspirin Ec 81 Mg Tbec (Aspirin) .[name]... Take 1 tablet by mouth every morning  Patient Instructions: 1)  Your physician recommends that you schedule a follow-up appointment in: 2 WEEKS 2)  Your physician has requested that you have a cardiac catheterization.  Cardiac catheterization is used to diagnose and/or treat various heart conditions. Doctors may recommend this procedure for a number of different reasons. The most common reason is to evaluate chest pain. Chest pain can be a symptom of coronary artery disease (CAD), and cardiac catheterization can show whether plaque is narrowing or blocking your heart's arteries. This procedure is also used to evaluate the valves, as well as measure the blood flow and oxygen levels in different parts of your heart.  For further information please visit [email or web address]  Please follow instruction sheet, as given.

## 2010-12-29 NOTE — Assessment & Plan Note (Signed)
Summary: 1 month rov./sl   Visit Type:  1 mo f/u Primary Provider:  Dr. Marga Melnick  CC:  no energy..no other complaints today.  History of Present Illness: [name] is seen today post CABG.  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  Post op course was complicated by afib that was hard to control.  He was sent home on Dig, BB and Multaq  His appetite is still poor.  He denies palpitatoins, PND, orthopnea.  Post op CXR shows no effusion.  Sternum and LE's are well healed.  He is in NSR today with a QT of 386.  He denies SSCP, fever or syncope.  Since he is in sinus and relatively brady with good LV we will stop his multaq and digoxen.  I will see him back in 4 weeks to make a decision about his coumadin.  He will continue ASA for his CAD.  He will need F/U LFT's and cholesterol in 3 months on statin  Current Problems (verified): 1)  Atrial Fibrillation  (ICD-427.31) 2)  Chest Pain  (ICD-786.50) 3)  Palpitations  (ICD-785.1) 4)  Cad  (ICD-414.00) 5)  Premature Ventricular Contractions  (ICD-427.69) 6)  Vertebral Fracture, Thoracic Spine  (ICD-805.2) 7)  Hip Fracture, Right  (ICD-820.8) 8)  Change in Bowels  (ICD-787.99) 9)  Ibs  (ICD-564.1) 10)  Fatigue  (ICD-780.79) 11)  Dyspnea/shortness of Breath  (ICD-786.09) 12)  Lumbar Radiculopathy, Right  (ICD-724.4) 13)  Esophageal Reflux  (ICD-530.81) 14)  Unspecified Anemia  (ICD-285.9) 15)  Pain in Joint, Unspecified Site  (ICD-719.40) 16)  Hyperplasia, Prostate Nos W/urinary Obst/luts  (ICD-600.91) 17)  Fx Closed Dorsal Vertebra  (ICD-805.2) 18)  Osteoporosis  (ICD-733.00)  Current Medications (verified): 1)  Vitamin D3 1000 Unit Tabs (Cholecalciferol) .... Take 1 Tablet By Mouth Once A Day 2)  Aspirin Ec 81 Mg Tbec (Aspirin) .... Take 1 Tablet By Mouth Every Morning 3)  Tums E-X 750 Mg Chew (Calcium Carbonate Antacid) .... As Needed 4)  Multivitamins   Tabs (Multiple Vitamin) .[name].. 1 Tab By Mouth Once Daily 5)   Fiber 625 Mg Tabs (Calcium Polycarbophil) .[name].. 1 Tab By Mouth Once Daily 6)  Ranitidine Hcl 150 Mg Tabs (Ranitidine Hcl) .[name].. 1 Two Times A Day Pre Meals 7)  Folic Acid 1 Mg Tabs (Folic Acid) .[name].. 1 By Mouth Daily 8)  Crestor 10 Mg Tabs (Rosuvastatin Calcium) .[name].. 1 By Mouth Daily 9)  Coumadin 5 Mg Tabs (Warfarin Sodium) .... As Directed 10)  Ultram 50 Mg Tabs (Tramadol Hcl) .... As Needed 11)  Metoprolol Tartrate 25 Mg Tabs (Metoprolol Tartrate) .... Take One Tablet By Mouth Twice A Day  Allergies: 1)  ! Amoxicillin  Past History:  Past Medical History: Last updated: 02/25/2010 Current Problems:  CHEST PAIN (ICD-786.50) PALPITATIONS (ICD-785.1) CAD (ICD-414.00) PREMATURE VENTRICULAR CONTRACTIONS (ICD-427.69) VERTEBRAL FRACTURE, THORACIC SPINE (ICD-805.2) HIP FRACTURE, RIGHT (ICD-820.8) CHANGE IN BOWELS (ICD-787.99) IBS (ICD-564.1) FATIGUE (ICD-780.79) DYSPNEA/SHORTNESS OF BREATH (ICD-786.09) LUMBAR RADICULOPATHY, RIGHT (ICD-724.4) ESOPHAGEAL REFLUX (ICD-530.81) UNSPECIFIED ANEMIA (ICD-285.9) PAIN IN JOINT, UNSPECIFIED SITE (ICD-719.40) HYPERPLASIA, PROSTATE NOS W/URINARY OBST/LUTS (ICD-600.91) FX CLOSED DORSAL VERTEBRA (ICD-805.2) OSTEOPOROSIS (ICD-733.00)Vertebral fractures , nontraumatic  2008; R hip fracture 03/2008  Past Surgical History: Last updated: 02/25/2010 Hip/femur fracture post fall , Dr Erin Sons, S/P pinning    Right knee surgery many years ago.    Fall with right hip fracture repair in 2009.   Compression fracture of the lumbar spine 3-4 years ago.  Family History: Last updated: 02/15/2010 non-contributory  Social History: Last updated: 02/15/2010 Married Active Non-smoker Non-drinker  Review of Systems       Denies fever, malais, weight loss, blurry vision, decreased visual acuity, cough, sputum, SOB, hemoptysis, pleuritic pain, palpitaitons, heartburn, abdominal pain, melena, lower extremity edema, claudication, or rash.   Vital  Signs:  Patient profile:   77 year old male Height:      76 inches Weight:      146 pounds BMI:     17.84 Pulse rate:   49 / minute Pulse rhythm:   regular BP sitting:   100 / 56  (right arm) Cuff size:   regular  Vitals Entered By: Danielle Rankin, CMA (Apr 18, 2010 11:39 AM)  Physical Exam  General:  Affect appropriate Healthy:  appears stated age HEENT: normal Neck supple with no adenopathy JVP normal no bruits no thyromegaly Lungs clear with no wheezing and good diaphragmatic motion Heart:  S1/S2 no murmur,rub, gallop or click PMI normal Abdomen: benighn, BS positve, no tenderness, no AAA no bruit.  No HSM or HJR Distal pulses intact with no bruits No edema Neuro non-focal Skin warm and dry Sternum well healed Endoscopic harvest sight in both legs looks good   Impression & Recommendations:  Problem # 1:  ATRIAL FIBRILLATION (ICD-427.31) NSR.  Stop Multaq and Digoxen  continue coumadin for at least 4 weeks The following medications were removed from the medication list:    Digoxin 0.25 Mg Tabs (Digoxin) .[name]... 1 by mouth daily    Multaq 400 Mg Tabs (Dronedarone hcl) .[name]... 1 by mouth two times a day His updated medication list for this problem includes:    Aspirin Ec 81 Mg Tbec (Aspirin) .[name]... Take 1 tablet by mouth every morning    Coumadin 5 Mg Tabs (Warfarin sodium) .[name]... As directed    Metoprolol Tartrate 25 Mg Tabs (Metoprolol tartrate) .[name]... Take one tablet by mouth twice a day  Problem # 2:  CAD (ICD-414.00) Stabel post CABG Continue ASA and BB His updated medication list for this problem includes:    Aspirin Ec 81 Mg Tbec (Aspirin) .[name]... Take 1 tablet by mouth every morning    Coumadin 5 Mg Tabs (Warfarin sodium) .[name]... As directed    Metoprolol Tartrate 25 Mg Tabs (Metoprolol tartrate) .[name]... Take one tablet by mouth twice a day  Problem # 3:  MIXED HYPERLIPIDEMIA (ICD-272.2) Labs in 3 months  Continue statin post CABG His updated medication list for this  problem includes:    Crestor 10 Mg Tabs (Rosuvastatin calcium) .[name]... 1 by mouth daily  Patient Instructions: 1)  Your physician recommends that you schedule a follow-up appointment in: 4 weeks with Dr Eden Emms 2)  Your physician has recommended you make the following change in your medication: Stop Multax and Digoxin Prescriptions: METOPROLOL TARTRATE 25 MG TABS (METOPROLOL TARTRATE) Take one tablet by mouth twice a day  #30 x 11   Entered by:   Danielle Rankin, CMA   Authorized by:   Colon Branch, MD, [facility]   Signed by:   Danielle Rankin, CMA on 04/18/2010   Method used:   Electronically to        [facility]* (retail)       [address]       [place], [place]  [id number]       Ph: [phone]       Fax: [phone]   RxID:   [id number]    EKG Report  Procedure date:  04/18/2010  Findings:      NSR 55 PVC QT 386 RSR' Nonspecific ST/T wave changes

## 2010-12-29 NOTE — Medication Information (Signed)
Summary: rov coumadin - [facility]  Anticoagulant Therapy  Managed by: Weston Brass, PharmD Referring MD: Dr. Charlton Haws PCP: Dr. Marga Melnick Supervising MD: Graciela Husbands MD, Viviann Spare Indication 1: Atrial Fibrillation Indication 2: Atrial Fibrillation Lab Used: [facility] [place] Site: [address] INR POC 1.6 INR RANGE 2.0-3.0  Dietary changes: no    Health status changes: yes       Details: having some congestion; suggested Mucinex and lots of water   Bleeding/hemorrhagic complications: no    Recent/future hospitalizations: no    Any changes in medication regimen? no    Recent/future dental: no  Any missed doses?: no       Is patient compliant with meds? yes       Allergies: 1)  ! Amoxicillin  Anticoagulation Management History:      The patient is taking warfarin and comes in today for a routine follow up visit.  Positive risk factors for bleeding include an age of 75 years or older.  The bleeding index is 'intermediate risk'.  Positive CHADS2 values include Age > 48 years old.  His last INR was 1.0 ratio.  Anticoagulation responsible provider: Graciela Husbands MD, Viviann Spare.  INR POC: 1.6.  Cuvette Lot#: [id number].  Exp: 06/2011.    Anticoagulation Management Assessment/Plan:      The patient's current anticoagulation dose is Coumadin 5 mg tabs: as directed.  The target INR is 2.0-3.0.  The next INR is due 04/18/2010.  Anticoagulation instructions were given to patient.  Results were reviewed/authorized by Weston Brass, PharmD.  He was notified by Weston Brass PharmD.         Prior Anticoagulation Instructions: INR 1.6 Change dose to 1 pill everyday except on Tuesdays and Saturdays take 1.5 pills. Recheck in one week.  Current Anticoagulation Instructions: INR 1.6  Take 2 tablets today then increase dose to 1 tablet every day except 1 1/2 tablets on Tuesday, Thursday and Saturday

## 2010-12-29 NOTE — Assessment & Plan Note (Signed)
Summary: chest discomfort//lch   Vital Signs:  Patient profile:   77 year old male Weight:      161 pounds Temp:     98.0 degrees F oral Pulse rate:   62 / minute Resp:     17 per minute BP sitting:   110 / 68  (left arm)  Vitals Entered By: Jeremy Johann CMA (February 08, 2010 4:08 PM) CC: chest discomfort x2 days Comments REVIEWED MED LIST, PATIENT AGREED DOSE AND INSTRUCTION CORRECT    CC:  chest discomfort x2 days.  History of Present Illness:       This is a 77 year old male who presents with Chest pain.  The patient reports resting chest pain in church 03/13, exertional chest pain initially 02/05/2010 while mowing ,with  indigestion both episodes.He walked 03/13 w/o pain. No cp chopping wood 2 weeks ago.He  denies nausea, vomiting, diaphoresis, shortness of breath, palpitations, dizziness, light headedness, and syncope.  The pain is described as intermittent and pressure-like.  The pain is located in the epigastric area and the occasionally  by lying down.  The pain is relieved or improved with eructation and  sitting up. No food or med triggers.TUMS helped.His father had CAD; a sister had HF.No PMH of ERD.  Allergies: 1)  ! Amoxicillin  Review of Systems General:  Denies weight loss; Weight up 10 # since fall. ENT:  Denies difficulty swallowing and hoarseness. GI:  Denies bloody stools and dark tarry stools.  Physical Exam  General:  Thin,in no acute distress; alert,appropriate and cooperative throughout examination Eyes:  No corneal or conjunctival inflammation noted.Perrla. No icterus Mouth:  Oral mucosa and oropharynx without lesions or exudates. No pharyngeal erythema.   Lungs:  Normal respiratory effort, chest expands symmetrically. Lungs are clear to auscultation, no crackles or wheezes. Heart:  regular rhythm and bradycardia.   Split S1 Abdomen:  Bowel sounds positive,abdomen soft and non-tender without masses, organomegaly or hernias noted. Aorta palpable with  bruit; < 3 cm diameter Pulses:  R and L carotid,radial,dorsalis pedis and posterior tibial pulses are full and equal bilaterally Extremities:  No clubbing, cyanosis, edema. Neg Homan's Neurologic:  alert & oriented X3.   Skin:  Intact without suspicious lesions or rashes. No jaundice Psych:  memory intact for recent and remote, normally interactive, good eye contact, and not anxious appearing.     Impression & Recommendations:  Problem # 1:  CHEST PAIN (ICD-786.50)  onset with mowing; R/O HH with ERD  Orders: EKG w/ Interpretation (93000) Misc. Referral (Misc. Ref) Prescription Created Electronically [phone])  Problem # 2:  PREMATURE VENTRICULAR CONTRACTIONS (ICD-427.69)  His updated medication list for this problem includes:    Aspirin Ec 81 Mg Tbec (Aspirin) .[name]... Take 1 tablet by mouth every morning  Orders: EKG w/ Interpretation (93000) Misc. Referral (Misc. Ref)  Complete Medication List: 1)  Vitamin D3 1000 Unit Tabs (Cholecalciferol) .... Take 1 tablet by mouth once a day 2)  Aspirin Ec 81 Mg Tbec (Aspirin) .... Take 1 tablet by mouth every morning 3)  Tums E-x 750 Mg Chew (Calcium carbonate antacid) .... Take 4)  Multivitamin  5)  Fiber  6)  Librax 2.5-5 Mg Caps (Clidinium-chlordiazepoxide) .[name].. 1 q 6 hrs as needed cramps 7)  Ranitidine Hcl 150 Mg Tabs (Ranitidine hcl) .[name].. 1 two times a day pre meals  Patient Instructions: 1)  Avoid foods high in acid (tomatoes, citrus juices, spicy foods). Avoid eating within two hours of lying down or before  exercising. Do not over eat; try smaller more frequent meals. Elevate head of bed twelve inches when sleeping. No exercise until stress test done. To ER for persistant chest pain. Prescriptions: RANITIDINE HCL 150 MG TABS (RANITIDINE HCL) 1 two times a day pre meals  #60 x 1   Entered and Authorized by:   Marga Melnick MD   Signed by:   Marga Melnick MD on 02/08/2010   Method used:   Faxed to ...       [facility]*  (retail)       [address]       [place], [place]  [id number]       Ph: [phone]       Fax: [phone]   RxID:   [id number]

## 2010-12-29 NOTE — Assessment & Plan Note (Signed)
Summary: stomach problems/kn   Vital Signs:  Patient profile:   77 year old male Height:      78 inches (198.12 cm) Weight:      163 pounds (74.09 kg) BMI:     18.90 Temp:     97.5 degrees F (36.39 degrees C) oral BP sitting:   122 / 68  (left arm)  Vitals Entered By: Lucious Groves CMA (December 21, 2010 11:17 AM) CC: Pt notes that he is having stomach and urine issues./kb Is Patient Diabetic? No Pain Assessment Patient in pain? no      Comments Patient notes that he has been having burning with urination x3-4 days. He also is having abd cramping x1 month that seems to be related to taking Align.   History of Present Illness: last week noted dysuria. He did not notice any gross hematuria. He admits to some frequency and difficulty urinating which are new symptoms.  Also complains of lower abdominal cramps for a few weeks. Cramps are worse after eating and he gets some  relief after bowel movements. He took align x 30 days as prescribed for a episode of diarrhea which resolved already.  For the last few days, has noted a bulge   mass at the left suprapubic area. Area is reducible.  ROS No fevers No nausea King No blood in the stools, bowel movements are now back to baseline although he has about 3 bowel movements daily. he admits to a history of BPH, no history of prostate surgery, his urologist is Dr. Vonita Moss (per pt) No history of glaucoma.  Current Medications (verified): 1)  Vitamin D3 1000 Unit Tabs (Cholecalciferol) .... Take 1 Tablet By Mouth Once A Day 2)  Aspirin 325 Mg Tabs (Aspirin) .... Take 1 Tab Once Daily 3)  Tums E-X 750 Mg Chew (Calcium Carbonate Antacid) .... As Needed 4)  Multivitamins   Tabs (Multiple Vitamin) .[name].. 1 Tab By Mouth Once Daily 5)  Fiber 625 Mg Tabs (Calcium Polycarbophil) .[name].. 1 Tab By Mouth Once Daily (On Hold) 6)  Ranitidine Hcl 150 Mg Tabs (Ranitidine Hcl) .[name].. 1 Two Times A Day Pre Meals As Needed 7)  Folic Acid 1 Mg Tabs (Folic Acid)  .[name].. 1 By Mouth Daily 8)  Crestor 10 Mg Tabs (Rosuvastatin Calcium) .[name].. 1 By Mouth Daily 9)  Metoprolol Tartrate 25 Mg Tabs (Metoprolol Tartrate) .... Take One Tablet By Mouth Twice A Day  Allergies (verified): 1)  ! Amoxicillin  Past History:  Past Medical History: Reviewed history from 10/18/2010 and no changes required. CHEST PAIN (ICD-786.50) CAD (ICD-414.00) PREMATURE VENTRICULAR CONTRACTIONS (ICD-427.69) VERTEBRAL FRACTURE, THORACIC SPINE (ICD-805.2) HIP FRACTURE, RIGHT (ICD-820.8) CHANGE IN BOWELS (ICD-787.99) IBS (ICD-564.1) FATIGUE (ICD-780.79) DYSPNEA/SHORTNESS OF BREATH (ICD-786.09) LUMBAR RADICULOPATHY, RIGHT (ICD-724.4) ESOPHAGEAL REFLUX (ICD-530.81) UNSPECIFIED ANEMIA (ICD-285.9) PAIN IN JOINT, UNSPECIFIED SITE (ICD-719.40) HYPERPLASIA, PROSTATE NOS W/URINARY OBST/LUTS (ICD-600.91) FX CLOSED DORSAL VERTEBRA (ICD-805.2) OSTEOPOROSIS (ICD-733.00)Vertebral fractures , nontraumatic  2008; R hip fracture 03/2008  Past Surgical History: Reviewed history from 10/18/2010 and no changes required. Hip/femur fracture post fall , Dr Erin Sons, S/P pinning   Right knee surgery  remotely  Fall with right hip fracture repair in 2009.  Compression fracture of the lumbar spine      Social History: Reviewed history from 10/28/2010 and no changes required. Married Active Non-smoker quit Non-drinker Illicit Drug Use - no  Physical Exam  General:  alert and well-developed.   Eyes:  not pale Lungs:  Normal respiratory effort, chest expands symmetrically. Lungs are  clear to auscultation, no crackles or wheezes. Heart:  normal rate, regular rhythm, and no murmur.   Abdomen:  soft, no distention, no masses, no hepatomegaly, and no splenomegaly.  slightly tender without mass or rebound at the left lower quadrant. He does have a reducible hernia at the left suprapubic area, only noticeable with cough or the Valsalva maneuver. Rectal:  No external abnormalities noted. Normal  sphincter tone. No rectal masses or tenderness.brown stools, Hemoccult negative Genitalia:  the skin at the glans is hypochromic with some vascularity. The urethra opening seems to be  small and slightly red. The patient reports that that is the normal  aspect of his penis Prostate:  prostate is slightly asymmetric, since the larger on the left. Mild discomfort to DRE   Impression & Recommendations:  Problem # 1:  ? of PROSTATITIS, ACUTE (ICD-601.0) symptoms as described above  may represent a UTI or acute prostatitis. Digital rectal exam showed a slightly asymmetric prostate I am also somewhat concerned about the aspect of his penis (glans) although he reports the appearance of his penis has not changed  plan: urine culture Cipro followup with PCP in 4 weeks I would recommend a reexamination of his genital area and possibly a urology referral.  Problem # 2:  INGUINAL HERNIA (ICD-550.90) he has a abdominal wall hernia on exam. Patient aware If  hedevelops any symptoms of incarceration he is to call immediately Referral to surgery? Will ask patient to discuss with PCP  Problem # 3:  IBS (ICD-564.1) the patient has lower abdominal cramps, stools are brown and Hemoccult negative. recent stool studies and blood tets  in November  were unremarkable. IBS? Diverticulitis? ... he has no fever and normal BMs. Plan: Trial with Levsin, he  already  discontinued align Reassess in 4 weeks  Complete Medication List: 1)  Vitamin D3 1000 Unit Tabs (Cholecalciferol) .... Take 1 tablet by mouth once a day 2)  Aspirin 325 Mg Tabs (Aspirin) .... Take 1 tab once daily 3)  Tums E-x 750 Mg Chew (Calcium carbonate antacid) .... As needed 4)  Multivitamins Tabs (Multiple vitamin) .[name].. 1 tab by mouth once daily 5)  Fiber 625 Mg Tabs (Calcium polycarbophil) .[name].. 1 tab by mouth once daily (on hold) 6)  Ranitidine Hcl 150 Mg Tabs (Ranitidine hcl) .[name].. 1 two times a day pre meals as needed 7)  Folic Acid 1  Mg Tabs (Folic acid) .[name].. 1 by mouth daily 8)  Crestor 10 Mg Tabs (Rosuvastatin calcium) .[name].. 1 by mouth daily 9)  Metoprolol Tartrate 25 Mg Tabs (Metoprolol tartrate) .... Take one tablet by mouth twice a day 10)  Ciprofloxacin Hcl 500 Mg Tabs (Ciprofloxacin hcl) .... One by mouth twice a day for 2 weeks 11)  Levsin 0.125 Mg Tabs (Hyoscyamine sulfate) .... One by mouth every 6 hours as needed for cramps  Other Orders: UA Dipstick w/o Micro (manual) (66063) T-Urine Culture (Spectrum Order) [id number]) UA Dipstick W/ Micro (manual) (55732)  Patient Instructions: 1)  Ciprofloxin for 2 weeks 2)  Levsin as needed for cramps 3)  Call any time if  worse, high fever, increased symptoms 4)  Please see your primary doctor in 4 weeks Prescriptions: LEVSIN 0.125 MG TABS (HYOSCYAMINE SULFATE) one by mouth every 6 hours as needed for cramps  #30 x 0   Entered and Authorized by:   Nolon Rod. Ezzie Senat MD   Signed by:   Nolon Rod. Punam Broussard MD on 12/21/2010   Method used:   Print then  Give to Patient   RxID:   [id number] CIPROFLOXACIN HCL 500 MG TABS (CIPROFLOXACIN HCL) one by mouth twice a day for 2 weeks  #28 x 0   Entered and Authorized by:   Nolon Rod. Reggie Welge MD   Signed by:   Nolon Rod. Kejon Feild MD on 12/21/2010   Method used:   Print then Give to Patient   RxID:   [id number]    Orders Added: 1)  UA Dipstick w/o Micro (manual) [81002] 2)  T-Urine Culture (Spectrum Order) [[id number]] 3)  UA Dipstick W/ Micro (manual) [81000] 4)  Est. Patient Level IV [40347]    Laboratory Results   Urine Tests  Date/Time Received: Lucious Groves [facility]  December 21, 2010 11:23 AM  Date/Time Reported: Lucious Groves CMA  December 21, 2010 11:23 AM   Routine Urinalysis   Color: yellow Appearance: Hazy Glucose: negative   (Normal Range: Negative) Bilirubin: negative   (Normal Range: Negative) Ketone: negative   (Normal Range: Negative) Spec. Gravity: <1.005   (Normal Range: 1.003-1.035) Blood: moderate   (Normal Range:  Negative) pH: 7.0   (Normal Range: 5.0-8.0) Protein: negative   (Normal Range: Negative) Urobilinogen: 0.2   (Normal Range: 0-1) Nitrite: negative   (Normal Range: Negative) Leukocyte Esterace: trace   (Normal Range: Negative)    Comments: Floydene Flock  December 21, 2010 11:57 AM     Laboratory Results   Urine Tests    Routine Urinalysis   Color: yellow Appearance: Hazy Glucose: negative   (Normal Range: Negative) Bilirubin: negative   (Normal Range: Negative) Ketone: negative   (Normal Range: Negative) Spec. Gravity: <1.005   (Normal Range: 1.003-1.035) Blood: moderate   (Normal Range: Negative) pH: 7.0   (Normal Range: 5.0-8.0) Protein: negative   (Normal Range: Negative) Urobilinogen: 0.2   (Normal Range: 0-1) Nitrite: negative   (Normal Range: Negative) Leukocyte Esterace: trace   (Normal Range: Negative)    Comments: Floydene Flock  December 21, 2010 11:57 AM

## 2010-12-29 NOTE — Medication Information (Signed)
Summary: rov/sp  Anticoagulant Therapy  Managed by: Bethena Midget, RN, BSN Referring MD: Dr. Charlton Haws PCP: Dr. Marga Melnick Supervising MD: Eden Emms MD, Theron Arista Indication 1: Atrial Fibrillation Indication 2: Atrial Fibrillation Lab Used: [facility] [place] Site: [address] INR POC 1.8 INR RANGE 2.0-3.0  Dietary changes: no    Health status changes: no    Bleeding/hemorrhagic complications: no    Recent/future hospitalizations: no    Any changes in medication regimen? no    Recent/future dental: no  Any missed doses?: no       Is patient compliant with meds? yes      Comments: Seeing Dr Eden Emms today  Allergies: 1)  ! Amoxicillin  Anticoagulation Management History:      The patient is taking warfarin and comes in today for a routine follow up visit.  Positive risk factors for bleeding include an age of 75 years or older.  The bleeding index is 'intermediate risk'.  Positive CHADS2 values include Age > 57 years old.  His last INR was 1.0 ratio.  Anticoagulation responsible provider: Eden Emms MD, Theron Arista.  INR POC: 1.8.  Cuvette Lot#: [id number].  Exp: 07/2011.    Anticoagulation Management Assessment/Plan:      The patient's current anticoagulation dose is Coumadin 5 mg tabs: as directed.  The target INR is 2.0-3.0.  The next INR is due 06/03/2010.  Anticoagulation instructions were given to patient.  Results were reviewed/authorized by Bethena Midget, RN, BSN.  He was notified by Bethena Midget, RN, BSN.         Prior Anticoagulation Instructions: INR 1.5  Take 2 tablets today and tomorrow then increase dose to 1 1/2 tablets every day.  Recheck in 1 week.   Current Anticoagulation Instructions: INR 1.8 Today take 2 pills then change dose to 1 1/2 pills everyday except 2 pills on Sundays. Recheck in 2 weeks.

## 2010-12-29 NOTE — Medication Information (Signed)
Summary: rov coumadin - [facility]  Anticoagulant Therapy  Managed by: Weston Brass, PharmD Referring MD: Dr. Charlton Haws PCP: Dr. Marga Melnick Supervising MD: Jens Som MD, Arlys John Indication 1: Atrial Fibrillation Indication 2: Atrial Fibrillation Lab Used: [facility] [place] Site: [address] INR POC 1.9 INR RANGE 2.0-3.0  Dietary changes: no    Health status changes: no    Bleeding/hemorrhagic complications: no    Recent/future hospitalizations: no    Any changes in medication regimen? no    Recent/future dental: no  Any missed doses?: no       Is patient compliant with meds? yes       Allergies: 1)  ! Amoxicillin  Anticoagulation Management History:      The patient is taking warfarin and comes in today for a routine follow up visit.  Positive risk factors for bleeding include an age of 75 years or older.  The bleeding index is 'intermediate risk'.  Positive CHADS2 values include Age > 33 years old.  His last INR was 1.0 ratio.  Anticoagulation responsible provider: Jens Som MD, Arlys John.  INR POC: 1.9.  Cuvette Lot#: [id number].  Exp: 06/2011.    Anticoagulation Management Assessment/Plan:      The patient's current anticoagulation dose is Coumadin 5 mg tabs: as directed.  The target INR is 2.0-3.0.  The next INR is due 04/29/2010.  Anticoagulation instructions were given to patient.  Results were reviewed/authorized by Weston Brass, PharmD.  He was notified by Weston Brass PharmD.         Prior Anticoagulation Instructions: INR 1.6  Take 2 tablets today then increase dose to 1 tablet every day except 1 1/2 tablets on Tuesday, Thursday and Saturday   Current Anticoagulation Instructions: INR 1.9  Take 1 1/2 tablets today then resume same dose of 1 tablet every day except 1 1/2 tablets on Tuesday, Thursday and Saturday

## 2010-12-29 NOTE — Progress Notes (Signed)
Summary: triage  Phone Note From Other Clinic Call back at [phone]   Caller: Patient Call For: Dr. Jarold Motto (doctor of the day) Caller: Lynnell Catalan, pcc Call For: Dr. Jarold Motto (doctor of the day) Reason for Call: Schedule Patient Appt Summary of Call: Dr. Eden Emms referring pt for diarrhea x 3 weeks... Lynnell Catalan says next available (01/13) is not soon enough and pt should be triaged... no found GI hx Initial call taken by: Vallarie Mare,  October 28, 2010 9:36 AM  Follow-up for Phone Call        Patient scheduled with Willette Cluster, RNP for today at 3pm. Chart ordered.  Follow-up by: Selinda Michaels RN,  October 28, 2010 10:00 AM

## 2010-12-29 NOTE — Assessment & Plan Note (Signed)
Summary: rto/cbs   Vital Signs:  Patient profile:   77 year old male Weight:      151 pounds Pulse rate:   60 / minute Resp:     17 per minute BP sitting:   100 / 60  (left arm) Cuff size:   regular  Vitals Entered ByMarland Kitchen Shonna Chock (Apr 04, 2010 3:08 PM) CC: Follow-up from nursing home Comments REVIEWED MED LIST, PATIENT AGREED DOSE AND INSTRUCTION CORRECT    Primary Care Provider:  Dr. Marga Melnick  CC:  Follow-up from nursing home.  History of Present Illness: [name] is stable except for weakness following CBAG  complicated by AF. [name] now hired out. Nuclear Sress Test , Cath Note , D/C Summary  &  Dr Fabio Bering note reviewed.   Allergies: 1)  ! Amoxicillin  Review of Systems CV:  Denies chest pain or discomfort, difficulty breathing at night, difficulty breathing while lying down, leg cramps with exertion, lightheadness, near fainting, palpitations, shortness of breath with exertion, swelling of feet, and swelling of hands. Resp:  Denies cough, shortness of breath, sputum productive, and wheezing.  Physical Exam  General:  Thin,in no acute distress; alert,appropriate and cooperative throughout examination Chest Wall:  Op scar well healed Lungs:  Normal respiratory effort, chest expands symmetrically. Lungs are clear to auscultation, no crackles or wheezes. Heart:  Normal rate and regular rhythm. Split  S1 ; S2 normal without gallop, murmur, click, rub or other extra sounds. NO NVD or HJR Abdomen:  Bowel sounds positive,abdomen soft and non-tender without masses, organomegaly or hernias noted. Pulses:  R and L carotid,radial,dorsalis pedis and posterior tibial pulses are full and equal bilaterally Extremities:  trace left pedal edema and trace right pedal edema.     Impression & Recommendations:  Problem # 1:  ATRIAL FIBRILLATION (ICD-427.31) Clinically today rhythm is regular His updated medication list for this problem includes:    Aspirin Ec 81 Mg Tbec (Aspirin)  .[name]... Take 1 tablet by mouth every morning    Digoxin 0.25 Mg Tabs (Digoxin) .[name]... 1 by mouth daily    Multaq 400 Mg Tabs (Dronedarone hcl) .[name]... 1 by mouth two times a day    Coumadin 5 Mg Tabs (Warfarin sodium) .[name]... As directed    Metoprolol Tartrate 25 Mg Tabs (Metoprolol tartrate) .[name]... Take one tablet by mouth twice a day  Problem # 2:  CAD (ICD-414.00) S/P  3 vessel CBAG  His updated medication list for this problem includes:    Aspirin Ec 81 Mg Tbec (Aspirin) .[name]... Take 1 tablet by mouth every morning    Multaq 400 Mg Tabs (Dronedarone hcl) .[name]... 1 by mouth two times a day    Metoprolol Tartrate 25 Mg Tabs (Metoprolol tartrate) .[name]... Take one tablet by mouth twice a day  Complete Medication List: 1)  Vitamin D3 1000 Unit Tabs (Cholecalciferol) .... Take 1 tablet by mouth once a day 2)  Aspirin Ec 81 Mg Tbec (Aspirin) .... Take 1 tablet by mouth every morning 3)  Tums E-x 750 Mg Chew (Calcium carbonate antacid) .... As needed 4)  Multivitamins Tabs (Multiple vitamin) .[name].. 1 tab by mouth once daily 5)  Fiber 625 Mg Tabs (Calcium polycarbophil) .[name].. 1 tab by mouth once daily 6)  Ranitidine Hcl 150 Mg Tabs (Ranitidine hcl) .[name].. 1 two times a day pre meals 7)  Digoxin 0.25 Mg Tabs (Digoxin) .[name].. 1 by mouth daily 8)  Multaq 400 Mg Tabs (Dronedarone hcl) .[name].. 1 by mouth two times  a day 9)  Folic Acid 1 Mg Tabs (Folic acid) .[name].. 1 by mouth daily 10)  Crestor 10 Mg Tabs (Rosuvastatin calcium) .[name].. 1 by mouth daily 11)  Coumadin 5 Mg Tabs (Warfarin sodium) .... As directed 12)  Ultram 50 Mg Tabs (Tramadol hcl) .... As needed 13)  Metoprolol Tartrate 25 Mg Tabs (Metoprolol tartrate) .... Take one tablet by mouth twice a day  Patient Instructions: 1)  No yard work until Dr Eden Emms OKs this.

## 2010-12-29 NOTE — Miscellaneous (Signed)
Summary: [facility] Progress Note   [facility] Progress Note   Imported By: Roderic Ovens 08/02/2010 11:48:26  _____________________________________________________________________  External Attachment:    Type:   Image     Comment:   External Document

## 2010-12-29 NOTE — Letter (Signed)
Summary: Appointment - Missed  [facility], Main Office  [address]   [place], [place] [address]   Phone: [phone]  Fax: [phone]     March 02, 2010 MRN: [id number]   [name] [address] [place], [place]  [address]   Dear [name],  Our records indicate you missed your appointment on 03/01/2010 with Dr. Eden Emms. It is very important that we reach you to reschedule this appointment. We look forward to participating in your health care needs. Please contact us at the number listed above at your earliest convenience to reschedule this appointment.     Sincerely,   [facility] Scheduling Team

## 2010-12-29 NOTE — Consult Note (Signed)
Summary: Consultation Report  Consultation Report   Imported By: Roderic Ovens 03/30/2010 10:56:33  _____________________________________________________________________  External Attachment:    Type:   Image     Comment:   External Document

## 2010-12-29 NOTE — Miscellaneous (Signed)
Summary: [facility] Note   [facility] Note   Imported By: Roderic Ovens 05/04/2010 14:33:38  _____________________________________________________________________  External Attachment:    Type:   Image     Comment:   External Document

## 2010-12-29 NOTE — Medication Information (Signed)
Summary: new eval. started 5mg  as directed  Anticoagulant Therapy  Managed by: Leota Sauers, PharmD, BCPS, CPP Referring MD: Dr. Charlton Haws PCP: Dr. Marga Melnick Supervising MD: Daleen Squibb MD, Maisie Fus Indication 1: Atrial Fibrillation Indication 2: Atrial Fibrillation INR POC 1.8  Dietary changes: no    Health status changes: no    Bleeding/hemorrhagic complications: no    Recent/future hospitalizations: no    Any changes in medication regimen? no    Recent/future dental: no  Any missed doses?: no       Is patient compliant with meds? yes      Comments: Plan DCCV after 4-6 INR > 2   Current Medications (verified): 1)  Vitamin D3 1000 Unit Tabs (Cholecalciferol) .... Take 1 Tablet By Mouth Once A Day 2)  Aspirin Ec 81 Mg Tbec (Aspirin) .... Take 1 Tablet By Mouth Every Morning 3)  Tums E-X 750 Mg Chew (Calcium Carbonate Antacid) .... As Needed 4)  Multivitamins   Tabs (Multiple Vitamin) .[name].. 1 Tab By Mouth Once Daily 5)  Fiber 625 Mg Tabs (Calcium Polycarbophil) .[name].. 1 Tab By Mouth Once Daily 6)  Ranitidine Hcl 150 Mg Tabs (Ranitidine Hcl) .[name].. 1 Two Times A Day Pre Meals 7)  Digoxin 0.25 Mg Tabs (Digoxin) .[name].. 1 By Mouth Daily 8)  Multaq 400 Mg Tabs (Dronedarone Hcl) .[name].. 1 By Mouth Two Times A Day 9)  Folic Acid 1 Mg Tabs (Folic Acid) .[name].. 1 By Mouth Daily 10)  Crestor 10 Mg Tabs (Rosuvastatin Calcium) .[name].. 1 By Mouth Daily 11)  Coumadin 5 Mg Tabs (Warfarin Sodium) .... As Directed 12)  Ultram 50 Mg Tabs (Tramadol Hcl) .... As Needed 13)  Metoprolol Tartrate 25 Mg Tabs (Metoprolol Tartrate) .... Take One Tablet By Mouth Twice A Day  Allergies: 1)  ! Amoxicillin  Anticoagulation Management History:      The patient is taking warfarin and comes in today for a routine follow up visit.  Positive risk factors for bleeding include an age of 75 years or older.  The bleeding index is 'intermediate risk'.  Positive CHADS2 values include Age > 75 years old.  His last INR was 1.0  ratio.  Anticoagulation responsible provider: Daleen Squibb MD, Maisie Fus.  INR POC: 1.8.  Cuvette Lot#: [id number].    Anticoagulation Management Assessment/Plan:      The patient's current anticoagulation dose is Coumadin 5 mg tabs: as directed.  The next INR is due 04/05/2010.  Results were reviewed/authorized by Leota Sauers, PharmD, BCPS, CPP.         Current Anticoagulation Instructions: INR 1.8  Today Tue 5/3 take 1 and 1/2 tab = 7.5mg  then coumadin 1 tab = 5mg  each day

## 2010-12-29 NOTE — Letter (Signed)
Summary: Cardiac Catheterization Instructions- [facility]  [facility], Main Office  [address]   [place], [place] [address]   Phone: [phone]  Fax: [phone]     02/15/2010 MRN: [id number]  [name] [address] [place], [place]  [address]  Dear [name], [name] are scheduled for a Cardiac Catheterization on THURSDAY 02-17-10 with Dr. Eden Emms  Please arrive to the 1st floor of the Heart and V[facility] Center at [facility] at    6:30 am    on the day of your procedure. Please do not arrive before 6:30 a.m. Call the Heart and Vascular Center at [phone] if you are unable to make your appointmnet. The Code to get into the parking garage under the building is 9000. Take the elevators to the 1st floor. You must have someone to drive you home. Someone must be with you for the first 24 hours after you arrive home. Please wear clothes that are easy to get on and off and wear slip-on shoes. Do not eat or drink after midnight except water with your medications that morning. Bring all your medications and current insurance cards with you.  ___ DO NOT take these medications before your procedure: ________________________________________________________________  ___ Make sure you take your aspirin.  ___ You may take ALL of your medications with water that morning. ________________________________________________________________________________________________________________________________  ___ DO NOT take ANY medications before your procedure.  ___ Pre-med instructions:  ________________________________________________________________________________________________________________________________  The usual length of stay after your procedure is 2 to 3 hours. This can vary.  If you have any questions, please call the office at the number listed above.   Deliah Goody, RN

## 2010-12-29 NOTE — Medication Information (Signed)
Summary: rov coumadin - [facility]  Anticoagulant Therapy  Managed by: Bethena Midget, RN, BSN Referring MD: Dr. Charlton Haws PCP: Dr. Marga Melnick Supervising MD: Eden Emms MD, Theron Arista Indication 1: Atrial Fibrillation Indication 2: Atrial Fibrillation Lab Used: [facility] [place] Site: [address] INR POC 1.6 INR RANGE 2.0-3.0  Dietary changes: no    Health status changes: no    Bleeding/hemorrhagic complications: no    Recent/future hospitalizations: no    Any changes in medication regimen? no    Recent/future dental: no  Any missed doses?: no       Is patient compliant with meds? yes      Comments: Pending DCCV is not spontaneously converted  Allergies: 1)  ! Amoxicillin  Anticoagulation Management History:      The patient is taking warfarin and comes in today for a routine follow up visit.  Positive risk factors for bleeding include an age of 75 years or older.  The bleeding index is 'intermediate risk'.  Positive CHADS2 values include Age > 75 years old.  His last INR was 1.0 ratio.  Anticoagulation responsible provider: Eden Emms MD, Theron Arista.  INR POC: 1.6.  Cuvette Lot#: [id number].  Exp: 06/2011.    Anticoagulation Management Assessment/Plan:      The patient's current anticoagulation dose is Coumadin 5 mg tabs: as directed.  The next INR is due 04/12/2010.  Anticoagulation instructions were given to patient.  Results were reviewed/authorized by Bethena Midget, RN, BSN.  He was notified by Bethena Midget, RN, BSN.         Prior Anticoagulation Instructions: INR 1.8  Today Tue 5/3 take 1 and 1/2 tab = 7.5mg  then coumadin 1 tab = 5mg  each day  Current Anticoagulation Instructions: INR 1.6 Change dose to 1 pill everyday except on Tuesdays and Saturdays take 1.5 pills. Recheck in one week.

## 2010-12-29 NOTE — Assessment & Plan Note (Signed)
Summary: ACUTE FOR DIARRHEA//PH   Vital Signs:  Patient profile:   77 year old male Weight:      158 pounds BMI:     18.32 Temp:     98.2 degrees F oral Pulse rate:   60 / minute Resp:     17 per minute BP sitting:   120 / 68  (left arm) Cuff size:   regular  Vitals Entered By: Shonna Chock CMA (October 18, 2010 1:46 PM) CC: Diarrhea x 10 days, nauseated off/on, headaches off/on and joint pain (both shoulders)  Pain Assessment Patient in pain? yes      Comments Flu Vaccine updated at [facility] near the end of October    Primary Care Kathee Tumlin:  Dr. Marga Melnick  CC:  Diarrhea x 10 days, nauseated off/on, and headaches off/on and joint pain (both shoulders) .  History of Present Illness: Diarrhea      This is a 77 year old man who presents with Diarrhea X 10 days.  The patient reports >6 stools per day, watery/unformed stools, mucus in stool, malodorous stools, fecal urgency, nocturnal diarrhea, fasting diarrhea, slight  bloating, and gradual onset of symptoms. He  denies voluminous stools, blood in stool, greasy stools, alternating diarrhea/constipation, and gassiness.  Associated symptoms include abdominal pain in LLQ & across lower  abdomen, nausea, weight loss of 2#, and joint pains in shoulders.  The patient denies fever, abdominal cramps, vomiting, lightheadedness, increased thirst, mouth ulcers, and eye redness.  The symptoms  last year with similar symptoms  were better with probiotics, but mot improved now. Symptoms are worse in am, but he is taking anti diarrrheal pills , 1 daiy. Patient has a  history of irritable bowel syndrome.   No pet , travel, antibiotic or suspicious food triggers.  Current Medications (verified): 1)  Vitamin D3 1000 Unit Tabs (Cholecalciferol) .... Take 1 Tablet By Mouth Once A Day 2)  Aspirin 325 Mg Tabs (Aspirin) .... Take 1 Tab Once Daily 3)  Tums E-X 750 Mg Chew (Calcium Carbonate Antacid) .... As Needed 4)  Multivitamins   Tabs  (Multiple Vitamin) .[name].. 1 Tab By Mouth Once Daily 5)  Fiber 625 Mg Tabs (Calcium Polycarbophil) .[name].. 1 Tab By Mouth Once Daily 6)  Ranitidine Hcl 150 Mg Tabs (Ranitidine Hcl) .[name].. 1 Two Times A Day Pre Meals As Needed 7)  Folic Acid 1 Mg Tabs (Folic Acid) .[name].. 1 By Mouth Daily 8)  Crestor 10 Mg Tabs (Rosuvastatin Calcium) .[name].. 1 By Mouth Daily 9)  Metoprolol Tartrate 25 Mg Tabs (Metoprolol Tartrate) .... Take One Tablet By Mouth Twice A Day 10)  Veramyst 27.5 Mcg/spray Susp (Fluticasone Furoate) .... 2 Sprays Each Nostril Once Daily 11)  Clarinex 5 Mg Tabs (Desloratadine) .[name].. 1 By Mouth Once Daily As Needed 12)  Align  Caps (Probiotic Product) .[name].. 1 By Mouth Once Daily As Needed  Allergies: 1)  ! Amoxicillin  Past History:  Past Medical History: CHEST PAIN (ICD-786.50) CAD (ICD-414.00) PREMATURE VENTRICULAR CONTRACTIONS (ICD-427.69) VERTEBRAL FRACTURE, THORACIC SPINE (ICD-805.2) HIP FRACTURE, RIGHT (ICD-820.8) CHANGE IN BOWELS (ICD-787.99) IBS (ICD-564.1) FATIGUE (ICD-780.79) DYSPNEA/SHORTNESS OF BREATH (ICD-786.09) LUMBAR RADICULOPATHY, RIGHT (ICD-724.4) ESOPHAGEAL REFLUX (ICD-530.81) UNSPECIFIED ANEMIA (ICD-285.9) PAIN IN JOINT, UNSPECIFIED SITE (ICD-719.40) HYPERPLASIA, PROSTATE NOS W/URINARY OBST/LUTS (ICD-600.91) FX CLOSED DORSAL VERTEBRA (ICD-805.2) OSTEOPOROSIS (ICD-733.00)Vertebral fractures , nontraumatic  2008; R hip fracture 03/2008  Past Surgical History: Hip/femur fracture post fall , Dr Erin Sons, S/P pinning   Right knee surgery  remotely  Fall with right  hip fracture repair in 2009.  Compression fracture of the lumbar spine      Physical Exam  General:  Thin,in no acute distress; alert,appropriate and cooperative throughout examination Eyes:  No corneal or conjunctival inflammation noted. No icterus Mouth:  Oral mucosa and oropharynx without lesions or exudates.  Tongue moist Neck:  No deformities, masses, or tenderness noted. Lungs:  Normal respiratory  effort, chest expands symmetrically. Lungs are clear to auscultation, no crackles or wheezes. Heart:  Normal rate and regular rhythm. S1 and S2 normal without gallop, murmur, click, rub . S4 Abdomen:  Bowel sounds positive,abdomen soft and non-tender without masses, organomegaly or hernias noted. Skin:  Intact without suspicious lesions or rashes. No tenting or jaundice Cervical Nodes:  No lymphadenopathy noted Axillary Nodes:  No palpable lymphadenopathy Psych:  memory intact for recent and remote, normally interactive, and good eye contact.     Impression & Recommendations:  Problem # 1:  DIARRHEA (ICD-787.91)  Recurrent His updated medication list for this problem includes:    Align Caps (Probiotic product) .[name]... 1 by mouth once daily as needed    Lonox 2.5-0.025 Mg Tabs (Diphenoxylate-atropine) .[name]... 1 as needed as needed watery bm  Orders: Venipuncture ([address]) TLB-CBC Platelet - w/Differential (85025-CBCD) TLB-Hepatic/Liver Function Pnl (80076-HEPATIC) TLB-BMP (Basic Metabolic Panel-BMET) (80048-METABOL) T-Culture, Stool (87045/87046-70140) T-Culture, C-Diff Toxin A/B (60454-09811) T-Stool Fats [place] Stain [phone]) T-Stool Giardia / Crypto- EIA (13086) T-Stool for O&P (57846-96295)  Complete Medication List: 1)  Vitamin D3 1000 Unit Tabs (Cholecalciferol) .... Take 1 tablet by mouth once a day 2)  Aspirin 325 Mg Tabs (Aspirin) .... Take 1 tab once daily 3)  Tums E-x 750 Mg Chew (Calcium carbonate antacid) .... As needed 4)  Multivitamins Tabs (Multiple vitamin) .[name].. 1 tab by mouth once daily 5)  Fiber 625 Mg Tabs (Calcium polycarbophil) .[name].. 1 tab by mouth once daily 6)  Ranitidine Hcl 150 Mg Tabs (Ranitidine hcl) .[name].. 1 two times a day pre meals as needed 7)  Folic Acid 1 Mg Tabs (Folic acid) .[name].. 1 by mouth daily 8)  Crestor 10 Mg Tabs (Rosuvastatin calcium) .[name].. 1 by mouth daily 9)  Metoprolol Tartrate 25 Mg Tabs (Metoprolol tartrate) .... Take one tablet by mouth  twice a day 10)  Veramyst 27.5 Mcg/spray Susp (Fluticasone furoate) .... 2 sprays each nostril once daily 11)  Clarinex 5 Mg Tabs (Desloratadine) .[name].. 1 by mouth once daily as needed 12)  Align Caps (Probiotic product) .[name].. 1 by mouth once daily as needed 13)  Lonox 2.5-0.025 Mg Tabs (Diphenoxylate-atropine) .[name].. 1 as needed as needed watery bm  Patient Instructions: 1)  Collect only watery stool for tests. Prescriptions: LONOX 2.5-0.025 MG TABS (DIPHENOXYLATE-ATROPINE) 1 as needed as needed watery BM  #10 x 0   Entered and Authorized by:   Marga Melnick MD   Signed by:   Marga Melnick MD on 10/18/2010   Method used:   Printed then faxed to ...       [facility]* (retail)       [address]       [place], [place]  [id number]       Ph: [phone]       Fax: [phone]   RxID:   [id number]    Orders Added: 1)  Est. Patient Level IV [[address]] 2)  Venipuncture [30160] 3)  TLB-CBC Platelet - w/Differential [85025-CBCD] 4)  TLB-Hepatic/Liver Function Pnl [80076-HEPATIC] 5)  TLB-BMP (Basic Metabolic Panel-BMET) [80048-METABOL] 6)  T-Culture, Stool [87045/87046-70140] 7)  T-Culture, C-Diff Toxin A/B [id number] 8)  T-Stool Fats [place] Stain [35573-22025] 9)  T-Stool Giardia / Crypto- EIA [42706] 10)  T-Stool for O&P [87177-70555]  Appended Document: ACUTE FOR DIARRHEA//PH

## 2010-12-29 NOTE — Progress Notes (Signed)
Summary: Refill Requests pt need coumadin refilled also  Phone Note Refill Request Call back at [phone] Message from:  Pharmacy on March 22, 2010 8:37 AM  Refills Requested: Medication #1:  CRESTOR 10 MG TABS 1 by mouth daily   Dosage confirmed as above?Dosage Confirmed   Supply Requested: 1 month  Medication #2:  DIGOXIN 0.25 MG TABS 1 by mouth daily   Dosage confirmed as above?Dosage Confirmed   Supply Requested: 1 month  Medication #3:  COUMADIN 5 MG TABS as directed   Dosage confirmed as above?Dosage Confirmed   Supply Requested: 1 month  Medication #4:  FOLIC ACID 1 MG TABS 1 by mouth daily   Dosage confirmed as above?Dosage Confirmed   Supply Requested: 1 month [facility] Patient was discharged from [facility] and needs new rx's for these meds please...thanks!  Next Appointment Scheduled: 5.9.11 Initial call taken by: Harold Barban,  March 22, 2010 8:37 AM    Prescriptions: CRESTOR 10 MG TABS (ROSUVASTATIN CALCIUM) 1 by mouth daily  #30 x 12   Entered by:   Kem Parkinson   Authorized by:   Colon Branch, MD, [facility]   Signed by:   Kem Parkinson on 03/22/2010   Method used:   Electronically to        [facility]* (retail)       [address]       [place], [place]  [address]       Ph: [phone]       Fax: [phone]   RxID:   [id number] FOLIC ACID 1 MG TABS (FOLIC ACID) 1 by mouth daily  #30 x 12   Entered by:   Kem Parkinson   Authorized by:   Colon Branch, MD, [facility]   Signed by:   Kem Parkinson on 03/22/2010   Method used:   Electronically to        [facility]* (retail)       [address]       [place], [place]  [id number]       Ph: [phone]       Fax: [phone]   RxID:   [id number] DIGOXIN 0.25 MG TABS (DIGOXIN) 1 by mouth daily  #30 x 12   Entered by:   Kem Parkinson   Authorized by:   Colon Branch, MD, [facility]   Signed by:   Kem Parkinson on 03/22/2010  Method used:   Electronically to        [facility]* (retail)       [address]       [place], [place]  [address]       Ph: [phone]       Fax: [phone]   RxID:   [id number]

## 2010-12-29 NOTE — Letter (Signed)
Summary: Custom - Lipid  [facility], Main Office  [address]   [place], [place] [address]   Phone: [phone]  Fax: [phone]     May 31, 2010 MRN: [id number]   EDSON DERIDDER [address] [place], [place]  [address]   Dear Mr. BOHORQUEZ,  We have reviewed your cholesterol results.  They are as follows:     Total Cholesterol:    131 (Desirable: less than 200)       HDL  Cholesterol:     61.70  (Desirable: greater than 40 for men and 50 for women)       LDL Cholesterol:       46  (Desirable: less than 100 for low risk and less than 70 for moderate to high risk)       Triglycerides:       117.0  (Desirable: less than 150)  Our recommendations include:These numbers look good. Continue on the same medicine. Liver function is normal. Take care, Dr. Leanora Cover.    Call our office at the number listed above if you have any questions.  Lowering your LDL cholesterol is important, but it is only one of a large number of "risk factors" that may indicate that you are at risk for heart disease, stroke or other complications of hardening of the arteries.  Other risk factors include:   A.  Cigarette Smoking* B.  High Blood Pressure* C.  Obesity* D.   Low HDL Cholesterol (see yours above)* E.   Diabetes Mellitus (higher risk if your is uncontrolled) F.  Family history of premature heart disease G.  Previous history of stroke or cardiovascular disease    *These are risk factors YOU HAVE CONTROL OVER.  For more information, visit .  There is now evidence that lowering the TOTAL CHOLESTEROL AND LDL CHOLESTEROL can reduce the risk of heart disease.  The American Heart Association recommends the following guidelines for the treatment of elevated cholesterol:  1.  If there is now current heart disease and less than two risk factors, TOTAL CHOLESTEROL should be less than 200 and LDL CHOLESTEROL should be less than 100. 2.  If there is current heart disease or two or more  risk factors, TOTAL CHOLESTEROL should be less than 200 and LDL CHOLESTEROL should be less than 70.  A diet low in cholesterol, saturated fat, and calories is the cornerstone of treatment for elevated cholesterol.  Cessation of smoking and exercise are also important in the management of elevated cholesterol and preventing vascular disease.  Studies have shown that 30 to 60 minutes of physical activity most days can help lower blood pressure, lower cholesterol, and keep your weight at a healthy level.  Drug therapy is used when cholesterol levels do not respond to therapeutic lifestyle changes (smoking cessation, diet, and exercise) and remains unacceptably high.  If medication is started, it is important to have you levels checked periodically to evaluate the need for further treatment options.  Thank you,    [employer] Team

## 2011-01-03 ENCOUNTER — Other Ambulatory Visit (AMBULATORY_SURGERY_CENTER): Payer: MEDICARE | Admitting: Gastroenterology

## 2011-01-03 ENCOUNTER — Encounter: Payer: Self-pay | Admitting: Gastroenterology

## 2011-01-03 DIAGNOSIS — Z1211 Encounter for screening for malignant neoplasm of colon: Secondary | ICD-10-CM

## 2011-01-03 DIAGNOSIS — K5730 Diverticulosis of large intestine without perforation or abscess without bleeding: Secondary | ICD-10-CM

## 2011-01-04 NOTE — Letter (Signed)
Summary: [facility] Instructions  [facility]  [address] [place], [place] [address]   Phone: [phone]  Fax: [phone]       [name]    [birth year]    MRN: [id number]        Procedure Day /Date: 01-03-2011     Arrival Time: 2:00 PM      Procedure Time: 3:00 PM     Location of Procedure:                    X       [facility] ([facility]th [facility])   PREPARATION FOR COLONOSCOPY WITH MOVIPREP   Starting 5 days prior to your procedure 12-29-2010 do not eat nuts, seeds, popcorn, corn, beans, peas,  salads, or any raw vegetables.  Do not take any fiber supplements (e.g. Metamucil, Citrucel, and Benefiber).  THE DAY BEFORE YOUR PROCEDURE         DATE: 01-02-2011  DAY: Monday  1.  Drink clear liquids the entire day-NO SOLID FOOD  2.  Do not drink anything colored red or purple.  Avoid juices with pulp.  No orange juice.  3.  Drink at least 64 oz. (8 glasses) of fluid/clear liquids during the day to prevent dehydration and help the prep work efficiently.  CLEAR LIQUIDS INCLUDE: Water Jello Ice Popsicles Tea (sugar ok, no milk/cream) Powdered fruit flavored drinks Coffee (sugar ok, no milk/cream) Gatorade Juice: apple, white grape, white cranberry  Lemonade Clear bullion, consomm, broth Carbonated beverages (any kind) Strained chicken noodle soup Hard Candy                             4.  In the morning, mix first dose of MoviPrep solution:    Empty 1 Pouch A and 1 Pouch B into the disposable container    Add lukewarm drinking water to the top line of the container. Mix to dissolve    Refrigerate (mixed solution should be used within 24 hrs)  5.  Begin drinking the prep at 5:00 p.m. The MoviPrep container is divided by 4 marks.   Every 15 minutes drink the solution down to the next mark (approximately 8 oz) until the full liter is complete.   6.  Follow completed prep with 16 oz of clear liquid of your choice (Nothing red or purple).  Continue to  drink clear liquids until bedtime.  7.  Before going to bed, mix second dose of MoviPrep solution:    Empty 1 Pouch A and 1 Pouch B into the disposable container    Add lukewarm drinking water to the top line of the container. Mix to dissolve    Refrigerate  THE DAY OF YOUR PROCEDURE      DATE: 01-03-2011  DAY: Tuesday  Beginning at 10 AM  (5 hours before procedure):         1. Every 15 minutes, drink the solution down to the next mark (approx 8 oz) until the full liter is complete.  2. Follow completed prep with 16 oz. of clear liquid of your choice.    3. You may drink clear liquids until 1:00 PM  (2 HOURS BEFORE PROCEDURE).   MEDICATION INSTRUCTIONS  Unless otherwise instructed, you should take regular prescription medications with a small sip of water   as early as possible the morning of your procedure.        OTHER INSTRUCTIONS  You will need  a responsible adult at least 76 years of age to accompany you and drive you home.   This person must remain in the waiting room during your procedure.  Wear loose fitting clothing that is easily removed.  Leave jewelry and other valuables at home.  However, you may wish to bring a book to read or  an iPod/MP3 player to listen to music as you wait for your procedure to start.  Remove all body piercing jewelry and leave at home.  Total time from sign-in until discharge is approximately 2-3 hours.  You should go home directly after your procedure and rest.  You can resume normal activities the  day after your procedure.  The day of your procedure you should not:   Drive   Make legal decisions   Operate machinery   Drink alcohol   Return to work  You will receive specific instructions about eating, activities and medications before you leave.    The above instructions have been reviewed and explained to me by   _______________________    I fully understand and can verbalize these instructions  _____________________________ Date _________

## 2011-01-04 NOTE — Assessment & Plan Note (Addendum)
Summary: Lower Abd pain (DR KAPLAN PT)   Vital Signs:  Patient profile:   77 year old male Height:      78 inches Weight:      162.50 pounds BMI:     18.85 BSA:     2.07 Pulse rate:   64 / minute Pulse rhythm:   regular BP sitting:   120 / 64  (right arm)  Vitals Entered By: Lamona Curl CMA Duncan Dull) (December 28, 2010 1:57 PM)  Preventive Screening-Counseling & Management  Alcohol-Tobacco     Smoking Status: quit  Caffeine-Diet-Exercise     Does Patient Exercise: no  Current Medications (verified): 1)  Vitamin D3 1000 Unit Tabs (Cholecalciferol) .... Take 1 Tablet By Mouth Once A Day 2)  Aspirin 325 Mg Tabs (Aspirin) .... Take 1 Tab Once Daily 3)  Fiber 625 Mg Tabs (Calcium Polycarbophil) .[name].. 1 Tab By Mouth Once Daily (On Hold) 4)  Folic Acid 1 Mg Tabs (Folic Acid) .[name].. 1 By Mouth Daily 5)  Crestor 10 Mg Tabs (Rosuvastatin Calcium) .[name].. 1 By Mouth Daily 6)  Metoprolol Tartrate 25 Mg Tabs (Metoprolol Tartrate) .... Take One Tablet By Mouth Twice A Day 7)  Ciprofloxacin Hcl 500 Mg Tabs (Ciprofloxacin Hcl) .... One By Mouth Twice A Day For 2 Weeks 8)  Moviprep 100 Gm  Solr (Peg-Kcl-Nacl-Nasulf-Na Asc-C) .... As Per Prep Instructions.  Allergies (verified): 1)  ! Amoxicillin  Past History:  Past Medical History: CHEST PAIN (ICD-786.50) CAD (ICD-414.00) PREMATURE VENTRICULAR CONTRACTIONS (ICD-427.69) VERTEBRAL FRACTURE, THORACIC SPINE (ICD-805.2) HIP FRACTURE, RIGHT (ICD-820.8) IBS (ICD-564.1) FATIGUE (ICD-780.79) DYSPNEA/SHORTNESS OF BREATH (ICD-786.09) LUMBAR RADICULOPATHY, RIGHT (ICD-724.4) ESOPHAGEAL REFLUX (ICD-530.81) UNSPECIFIED ANEMIA (ICD-285.9) PAIN IN JOINT, UNSPECIFIED SITE (ICD-719.40) HYPERPLASIA, PROSTATE NOS W/URINARY OBST/LUTS (ICD-600.91) FX CLOSED DORSAL VERTEBRA (ICD-805.2) OSTEOPOROSIS (ICD-733.00)Vertebral fractures , nontraumatic  2008; R hip fracture 03/2008 Compression fracture of the lumbar spine   Past Surgical History: Hip/femur  fracture post fall , Dr Erin Sons, S/P pinning   Right knee surgery  remotely  Fall with right hip fracture repair in 2009.  Heart Bypass     History of Present Illness Primary GI MD: Melvia Heaps MD [facility] Primary Provider: Dr. Marga Melnick Requesting Provider: n/a History of Present Illness:   [name] was here in December with complaints of diarrhea and lower abdominal pain which have since resolved, BMs are normal. Now he complains of intermittent mid abdominal  cramping which started mid December but seems to be getting worse. He doesn't have the pain everyday. He has tried to correlate pain to certain things and has concluded that pain does improve, but not totally subside with defecation. The pain goes away with laying down / rest. Bowel anti-spasmodics don't help. On Cipro for urinary symptoms (culture negative). Currently, no urinary symptoms. He also complains of noisy stomach.  His weight is stable, actually up a few pounds.   [name] developed a small left suprapubic hernia a few weeks ago. Patient sometimes has pain in the area when engaging in physical activity. This pain is separate from his mid abdominal pain described above.       GI Review of Systems    Reports abdominal pain.     Location of  Abdominal pain: mid.    Denies acid reflux, belching, bloating, chest pain, dysphagia with liquids, dysphagia with solids, heartburn, loss of appetite, nausea, vomiting, vomiting blood, weight loss, and  weight gain.        Denies anal fissure, black tarry stools, change in  bowel habit, constipation, diarrhea, diverticulosis, fecal incontinence, heme positive stool, hemorrhoids, irritable bowel syndrome, jaundice, light color stool, liver problems, rectal bleeding, and  rectal pain.  Family History: No FH of Colon Cancer: Family History of Breast Cancer:sister Family History of Heart Disease: father, mother and sister  Social History: Married Active Illicit Drug Use -  no Patient is a former smoker. -stopped 1964 Alcohol Use - yes-seldom wine Patient does not get regular exercise.   Review of Systems       The patient complains of anxiety-new, arthritis/joint pain, fatigue, and urination changes/pain.  The patient denies allergy/sinus, anemia, back pain, blood in urine, breast changes/lumps, change in vision, confusion, cough, coughing up blood, depression-new, fainting, fever, headaches-new, hearing problems, heart murmur, heart rhythm changes, itching, menstrual pain, muscle pains/cramps, night sweats, nosebleeds, pregnancy symptoms, shortness of breath, skin rash, sleeping problems, sore throat, swelling of feet/legs, swollen lymph glands, thirst - excessive , urination - excessive , urine leakage, vision changes, and voice change.    Physical Exam  General:  Well developed, well nourished, no acute distress. Head:  Normocephalic and atraumatic. Eyes:  Conjunctiva pink, no icterus.  Neck:  no obvious masses  Lungs:  Clear throughout to auscultation. Heart:  Regular rate and rhythm; no murmurs, rubs,  or bruits. Abdomen:  Abdomen soft, nontender, nondistended. No obvious masses or hepatomegaly.Normal bowel sounds.  Msk:  Symmetrical with no gross deformities. Normal posture. Extremities:  No palmar erythema, no edema.  Neurologic:  Alert and  oriented x4;  grossly normal neurologically. Skin:  Intact without significant lesions or rashes. Cervical Nodes:  No significant cervical adenopathy. Psych:  Alert and cooperative. Normal mood and affect.   Impression & Recommendations:  Problem # 1:  ABDOMINAL PAIN -GENERALIZED (ICD-789.07) Assessment New Intermittent mid abdominal pain, sometimes better with defecation and consistently improved with rest. Etiology not clear but patient has no alarm features, BMs normal, weight is stable, physical examination is benign. Pain could be musculoskeletal in nature. Bowel anti-spasmodics do not help. Though a  colonoscopy is not the best way to evaluate abdominal pain, especially in absence of other symptoms, patient is due for his screening colonoscopy. If there are no findings to explain his abdominal pain then he may need imaging studies.  Orders: Colonoscopy (Colon)  Problem # 2:  SCREENING COLORECTAL-CANCER (ICD-V76.51) Assessment: Comment Only The patient will be scheduled for a colonoscopy with biopsies/polypectomy (if indicated).  The risks and benefits of the procedure, as well as alternatives were discussed with the patient and he agrees to proceed.  Orders: Colonoscopy (Colon)  Patient Instructions: 1)  We have schedueld the Colonoscopy with Dr. Melvia Heaps on 01-03-2011. 2)  Directions and brochure provided. 3)  [facility] Patient Information Guide given to patient. 4)  We sent the perscription for the Colonoscopy Prep you will be drinking to [facility]. 5)  Copy sent to : Dr. Marga Melnick 6)  The medication list was reviewed and reconciled.  All changed / newly prescribed medications were explained.  A complete medication list was provided to the patient / caregiver. Prescriptions: MOVIPREP 100 GM  SOLR (PEG-KCL-NACL-NASULF-NA ASC-C) As per prep instructions.  #1 x 0   Entered by:   Lowry Ram NCMA   Authorized by:   Willette Cluster NP   Signed by:   Lowry Ram NCMA on 12/28/2010   Method used:   Electronically to        [facility]* (retail)  [address]       [place], [place]  [id number]       Ph: [phone]       Fax: [phone]   RxID:   [id number]

## 2011-01-04 NOTE — Progress Notes (Signed)
  Phone Note Outgoing Call   Call placed by: Joselyn Glassman,  December 26, 2010 2:50 PM Call placed to: Patient Summary of Call: Called the pt to ask if he wanted to schedule the Colonoscopy with Dr. Arlyce Dice.  He said he saw Dr. Drue Novel last week on  12-21-2010 for Abd pain and urinary problem.  He said he has also developed a hernia which he did not have when he was here in Dec when he saw Willette Cluster ACNP.  He told me he has abd cramping below the navel on the lt and rt side both.  He wanted to know if he should come in again for an office visit before he schedules the colonoscopy. Dr Arlyce Dice doesn't have any office appt openings until 01-18-2011. I will ask Willette Cluster ACNP if she thinks he should come to the office for an appt before scheduling the colonoscopy and call the pt back .  Initial call taken by: Joselyn Glassman,  December 26, 2010 2:54 PM  Follow-up for Phone Call        SPoke to paula and she said the pt should come in since he is having recent problems.  I made him an appt to see Willette Cluster ACNP on 12-28-2010.  Pt informed to come at 2:00 PM.  He is to bring his ins information and his medication list.  Follow-up by: Joselyn Glassman,  December 27, 2010 10:03 AM

## 2011-01-10 ENCOUNTER — Ambulatory Visit (INDEPENDENT_AMBULATORY_CARE_PROVIDER_SITE_OTHER): Payer: MEDICARE | Admitting: Internal Medicine

## 2011-01-10 ENCOUNTER — Other Ambulatory Visit: Payer: Self-pay | Admitting: Internal Medicine

## 2011-01-10 ENCOUNTER — Encounter: Payer: Self-pay | Admitting: Internal Medicine

## 2011-01-10 DIAGNOSIS — N429 Disorder of prostate, unspecified: Secondary | ICD-10-CM

## 2011-01-10 DIAGNOSIS — N390 Urinary tract infection, site not specified: Secondary | ICD-10-CM

## 2011-01-10 DIAGNOSIS — R300 Dysuria: Secondary | ICD-10-CM

## 2011-01-10 LAB — CONVERTED CEMR LAB
Ketones, urine, test strip: NEGATIVE
Nitrite: NEGATIVE
Specific Gravity, Urine: <1.005
WBC Urine, dipstick: NEGATIVE

## 2011-01-11 LAB — PSA: PSA: 1.53 ng/mL (ref 0.10–4.00)

## 2011-01-12 NOTE — Procedures (Addendum)
Summary: Colonscopy   Colonoscopy  Procedure date:  01/03/2011  Findings:      Location:  [facility].   COLONOSCOPY PROCEDURE REPORT  PATIENT:  [name], [name]  MR#:  [id number] BIRTHDATE:   [birth year], 80 yrs. old   GENDER:   male  ENDOSCOPIST:   Barbette Hair. Arlyce Dice, MD Referred by: Willow Ora, M.D.  PROCEDURE DATE:  01/03/2011 PROCEDURE:  Diagnostic Colonoscopy ASA CLASS:   Class II INDICATIONS: 1) Routine Risk Screening   MEDICATIONS:    Fentanyl 25 mcg IV, Versed 3 mg IV  DESCRIPTION OF PROCEDURE:   After the risks benefits and alternatives of the procedure were thoroughly explained, informed consent was obtained.  Digital rectal exam was performed and revealed no abnormalities.   The LB 180AL [id number] endoscope was introduced through the anus and advanced to the cecum, which was identified by both the appendix and ileocecal valve, without limitations.  The quality of the prep was excellent, using MoviPrep.  The instrument was then slowly withdrawn as the colon was fully examined. <<PROCEDUREIMAGES>>              <<OLD IMAGES>>  FINDINGS:  Severe diverticulosis was found in the sigmoid colon (see image8).  This was otherwise a normal examination of the colon (see image1, image2, image4, image6, image7, image9, and image10).   Retroflexed views in the rectum revealed no abnormalities.    The time to cecum =  5.0  minutes. The scope was then withdrawn (time =  5.25  min) from the patient and the procedure completed.  COMPLICATIONS:   None  ENDOSCOPIC IMPRESSION:  1) Severe diverticulosis in the sigmoid colon  2) Otherwise normal examination RECOMMENDATIONS:  1) Return to the care of your primary provider. GI follow up as needed 2) hyomax as needed for abd pain  REPEAT EXAM:   No   _______________________________ Barbette Hair. Arlyce Dice, MD    CC:

## 2011-01-12 NOTE — Miscellaneous (Signed)
  Clinical Lists Changes  Medications: Added new medication of HYOMAX-DT 0.375 MG CR-TABS (HYOSCYAMINE SULFATE) take 1 tab two times a day as needed abd pain - Signed Rx of HYOMAX-DT 0.375 MG CR-TABS (HYOSCYAMINE SULFATE) take 1 tab two times a day as needed abd pain;  #20 x 1;  Signed;  Entered by: Louis Meckel MD;  Authorized by: Louis Meckel MD;  Method used: Electronically to [facility]*, [address], [place], [place]  [address], Ph: [phone], Fax: [phone]    Prescriptions: HYOMAX-DT 0.375 MG CR-TABS (HYOSCYAMINE SULFATE) take 1 tab two times a day as needed abd pain  #20 x 1   Entered and Authorized by:   Louis Meckel MD   Signed by:   Louis Meckel MD on 01/03/2011   Method used:   Electronically to        [facility]* (retail)       [address]       [place], [place]  [address]       Ph: [phone]       Fax: [phone]   RxID:   [id number]

## 2011-01-18 NOTE — Assessment & Plan Note (Signed)
Summary: pain when urinating/cbs   Vital Signs:  Patient profile:   77 year old male Weight:      161.4 pounds BMI:     18.72 Temp:     97.7 degrees F oral Pulse rate:   60 / minute Resp:     16 per minute BP sitting:   128 / 70  (left arm) Cuff size:   regular  Vitals Entered By: Shonna Chock CMA (January 10, 2011 3:00 PM) CC: Pain when urinating since this am, Dysuria   Primary Care Provider:  Dr. Marga Melnick  CC:  Pain when urinating since this am and Dysuria.  History of Present Illness:    Onset this am as dysuria on  2nd  voiding. He  reports burning with urination and urinary frequency, but denies urgency, hematuria, and penile discharge.  The patient denies the following associated symptoms: nausea, vomiting, fever, shaking chills, flank pain, abdominal pain, and back pain.[name] He has had nocturia 1-2 X/ night since 09/2010.  PMH of BPH. Dr Leta Jungling 01/25 notereviewed ; prostate was slightly tender.Symptoms improved then with Cipro.  Allergies: 1)  ! Amoxicillin  Physical Exam  General:   Appears younger than age,well-nourished,in no acute distress; alert,appropriate and cooperative throughout examination Abdomen:  Bowel sounds positive,abdomen soft  but slighytly tender in suprapubic area  without masses, organomegaly or hernias noted.Aorta palpable w/o AAA   Impression & Recommendations:  Problem # 1:  DYSURIA (ICD-788.1) probable Prostatitis His updated medication list for this problem includes:    Ciprofloxacin Hcl 500 Mg Tabs (Ciprofloxacin hcl) ..... One by mouth twice a day for 2 weeks    Ciprofloxacin Hcl 500 Mg Tabs (Ciprofloxacin hcl) .[name]... 1 two times a day  Orders: UA Dipstick w/o Micro (manual) (96295) Prescription Created Electronically [phone])  Problem # 2:  DISORDER, PROSTATE (ICD-602.9)  see DRE 01/25  Orders: Prescription Created Electronically [phone]) Venipuncture (02725) TLB-PSA (Prostate Specific Antigen) (84153-PSA)  Complete  Medication List: 1)  Vitamin D3 1000 Unit Tabs (Cholecalciferol) .... Take 1 tablet by mouth once a day 2)  Aspirin 325 Mg Tabs (Aspirin) .... Take 1 tab once daily 3)  Fiber 625 Mg Tabs (Calcium polycarbophil) .[name].. 1 tab by mouth once daily (on hold) 4)  Folic Acid 1 Mg Tabs (Folic acid) .[name].. 1 by mouth daily 5)  Crestor 10 Mg Tabs (Rosuvastatin calcium) .[name].. 1 by mouth daily 6)  Metoprolol Tartrate 25 Mg Tabs (Metoprolol tartrate) .... Take one tablet by mouth twice a day 7)  Ciprofloxacin Hcl 500 Mg Tabs (Ciprofloxacin hcl) .... One by mouth twice a day for 2 weeks 8)  Moviprep 100 Gm Solr (Peg-kcl-nacl-nasulf-na asc-c) .... As per prep instructions. 9)  Hyomax-dt 0.375 Mg Cr-tabs (Hyoscyamine sulfate) .... Take 1 tab two times a day as needed abd pain 10)  Phenazopyridine Hcl 100 Mg Tabs (Phenazopyridine hcl) .[name].. 1 three times a day as needed for bladder symptoms 11)  Ciprofloxacin Hcl 500 Mg Tabs (Ciprofloxacin hcl) .[name].. 1 two times a day  Patient Instructions: 1)  Drink as much fluid as you can tolerate for the next few days. Prescriptions: CIPROFLOXACIN HCL 500 MG TABS (CIPROFLOXACIN HCL) 1 two times a day  #14 x 0   Entered and Authorized by:   Marga Melnick MD   Signed by:   Marga Melnick MD on 01/10/2011   Method used:   Electronically to        [facility]* (retail)       [address]       [place], [place]  [address]       Ph: [phone]       Fax: [phone]   RxID:   [id number] PHENAZOPYRIDINE HCL 100 MG TABS (PHENAZOPYRIDINE HCL) 1 three times a day as needed for bladder symptoms  #15 x 0   Entered and Authorized by:   Marga Melnick MD   Signed by:   Marga Melnick MD on 01/10/2011   Method used:   Electronically to        [facility]* (retail)       [address]       [place], [place]  [address]       Ph: [phone]       Fax: [phone]   RxID:   [id number]    Orders Added: 1)  UA Dipstick w/o Micro  (manual) [81002] 2)  Est. Patient Level III [17616] 3)  Prescription Created Electronically [G8553] 4)  Venipuncture [07371] 5)  TLB-PSA (Prostate Specific Antigen) [06269-SWN]     Laboratory Results   Urine Tests    Routine Urinalysis   Color: lt. yellow Appearance: Clear Glucose: negative   (Normal Range: Negative) Bilirubin: negative   (Normal Range: Negative) Ketone: negative   (Normal Range: Negative) Spec. Gravity: <1.005   (Normal Range: 1.003-1.035) Blood: negative   (Normal Range: Negative) pH: 7.0   (Normal Range: 5.0-8.0) Protein: negative   (Normal Range: Negative) Urobilinogen: 0.2   (Normal Range: 0-1) Nitrite: negative   (Normal Range: Negative) Leukocyte Esterace: negative   (Normal Range: Negative)       Appended Document: pain when urinating/cbs

## 2011-01-18 NOTE — Procedures (Signed)
Summary: [employer]   Imported By: Sherian Rein 01/11/2011 14:28:47  _____________________________________________________________________  External Attachment:    Type:   Image     Comment:   External Document

## 2011-01-31 ENCOUNTER — Encounter: Payer: Self-pay | Admitting: Internal Medicine

## 2011-01-31 ENCOUNTER — Ambulatory Visit (INDEPENDENT_AMBULATORY_CARE_PROVIDER_SITE_OTHER): Payer: MEDICARE | Admitting: Internal Medicine

## 2011-01-31 ENCOUNTER — Ambulatory Visit: Payer: MEDICARE | Admitting: Internal Medicine

## 2011-01-31 DIAGNOSIS — N401 Enlarged prostate with lower urinary tract symptoms: Secondary | ICD-10-CM

## 2011-01-31 DIAGNOSIS — R109 Unspecified abdominal pain: Secondary | ICD-10-CM | POA: Insufficient documentation

## 2011-01-31 DIAGNOSIS — K4090 Unilateral inguinal hernia, without obstruction or gangrene, not specified as recurrent: Secondary | ICD-10-CM

## 2011-02-07 NOTE — Assessment & Plan Note (Signed)
Summary: TALK ABOUT HERNIA///SPH   Vital Signs:  Patient profile:   77 year old male Weight:      162.8 pounds BMI:     18.88 Temp:     98.0 degrees F oral Pulse rate:   56 / minute Resp:     14 per minute BP sitting:   122 / 80  (left arm) Cuff size:   large  Vitals Entered By: Shonna Chock CMA (January 31, 2011 10:14 AM) CC: 1.) Discuss Hernia: patient would like to know his options   2.) Urology referral for ongoing symptoms  3.) Discuss Crestor: side effects vs risk (cardiology rx'ed), Abdominal pain   Primary Care Provider:  Dr. Marga [name]  CC:  1.) Discuss Hernia: patient would like to know his options   2.) Urology referral for ongoing symptoms  3.) Discuss Crestor: side effects vs risk (cardiology rx'ed) and Abdominal pain.  History of Present Illness:    [name] still has abdominal pain;he actually  has 2 types. One is  located in the  left lower quadrant.  The pain is described as intermittent and burning in quality.  The pain is better with  supine position. Also he has  pain is right lower and left lower quadrants.  The pain is described as intermittent and cramping in quality.  The pain is worse with food.  The patient denies nausea, vomiting, diarrhea, constipation, and melena.  The patient denies the following symptoms: fever, weight loss, and dysuria. He still has urgency & incomplete voiding. He has seen Dr Vonita Moss. Colonoscopy revealed severe diverticulosis 02/07. Align resolved watery bowel movement.  Allergies: 1)  ! Amoxicillin  Physical Exam  General:  in no acute distress; alert,appropriate and cooperative throughout examination Eyes:  No corneal or conjunctival inflammation noted.No icterus Mouth:  Oral mucosa and oropharynx without lesions or exudates.  No pharyngeal erythema.   Abdomen:  Bowel sounds positive,abdomen soft and non-tender without masses, organomegaly or hernias noted. Genitalia:  Reducible L direct hernia Skin:  Intact without suspicious  lesions or rashes; no jaundice Cervical Nodes:  No lymphadenopathy noted Axillary Nodes:  No palpable lymphadenopathy Psych:  Oriented X3, memory intact for recent and remote, and normally interactive.     Impression & Recommendations:  Problem # 1:  ABDOMINAL PAIN (ICD-789.00)  IBS variant   Orders: Prescription Created Electronically [phone])  Problem # 2:  HYPERTROPHY PROSTATE W/UR OBST & OTH LUTS (ICD-600.01)  Orders: Urology Referral (Urology)  Problem # 3:  INGUINAL HERNIA, LEFT (ICD-550.90)  Complete Medication List: 1)  Vitamin D3 1000 Unit Tabs (Cholecalciferol) .... Take 1 tablet by mouth once a day 2)  Aspirin 325 Mg Tabs (Aspirin) .... Take 1 tab once daily 3)  Fiber 625 Mg Tabs (Calcium polycarbophil) .[name].. 1 tab by mouth once daily (on hold) 4)  Folic Acid 1 Mg Tabs (Folic acid) .[name].. 1 by mouth daily 5)  Crestor 10 Mg Tabs (Rosuvastatin calcium) .[name].. 1 by mouth daily 6)  Metoprolol Tartrate 25 Mg Tabs (Metoprolol tartrate) .... Take one tablet by mouth twice a day 7)  Multivitamins Tabs (Multiple vitamin) .[name].. 1 by mouth once daily 8)  Ranitidine Hcl 150 Mg Tabs (Ranitidine hcl) .[name].. 1 by mouth two times a day 9)  Oscimin 0.125 Mg Subl (Hyoscyamine sulfate) .[name].. 1 under tongue evrry 6 hrs as needed for pain  Patient Instructions: 1)  Call  if swelling will not disappear with rest. Call when you want to see General Surgeon  Prescriptions: OSCIMIN 0.125 MG SUBL (HYOSCYAMINE SULFATE) 1 under tongue evrry 6 hrs as needed for pain  #30 x 0   Entered and Authorized by:   Eric Melnick MD   Signed by:   Eric Melnick MD on 01/31/2011   Method used:   Electronically to        [facility]* (retail)       [address]       [place], [place]  [id number]       Ph: [phone]       Fax: [phone]   RxID:   [id number]    Orders Added: 1)  Est. Patient Level III [24401] 2)  Urology Referral [Urology] 3)  Prescription Created Electronically  [phone]

## 2011-02-11 ENCOUNTER — Encounter: Payer: Self-pay | Admitting: Internal Medicine

## 2011-02-15 LAB — GLUCOSE, CAPILLARY
Glucose-Capillary: 111 mg/dL — ABNORMAL HIGH (ref 70–99)
Glucose-Capillary: 119 mg/dL — ABNORMAL HIGH (ref 70–99)
Glucose-Capillary: 121 mg/dL — ABNORMAL HIGH (ref 70–99)
Glucose-Capillary: 171 mg/dL — ABNORMAL HIGH (ref 70–99)

## 2011-02-15 LAB — CBC
HCT: 25.8 % — ABNORMAL LOW (ref 39.0–52.0)
HCT: 26.5 % — ABNORMAL LOW (ref 39.0–52.0)
HCT: 27.1 % — ABNORMAL LOW (ref 39.0–52.0)
HCT: 27.3 % — ABNORMAL LOW (ref 39.0–52.0)
HCT: 28.5 % — ABNORMAL LOW (ref 39.0–52.0)
HCT: 29.4 % — ABNORMAL LOW (ref 39.0–52.0)
Hemoglobin: 8.8 g/dL — ABNORMAL LOW (ref 13.0–17.0)
Hemoglobin: 9.1 g/dL — ABNORMAL LOW (ref 13.0–17.0)
Hemoglobin: 9.3 g/dL — ABNORMAL LOW (ref 13.0–17.0)
Hemoglobin: 9.6 g/dL — ABNORMAL LOW (ref 13.0–17.0)
Hemoglobin: 9.8 g/dL — ABNORMAL LOW (ref 13.0–17.0)
MCHC: 33.2 g/dL (ref 30.0–36.0)
MCHC: 33.3 g/dL (ref 30.0–36.0)
MCHC: 33.5 g/dL (ref 30.0–36.0)
MCHC: 33.8 g/dL (ref 30.0–36.0)
MCHC: 34 g/dL (ref 30.0–36.0)
MCV: 97 fL (ref 78.0–100.0)
MCV: 97 fL (ref 78.0–100.0)
MCV: 97.4 fL (ref 78.0–100.0)
MCV: 97.8 fL (ref 78.0–100.0)
MCV: 98.3 fL (ref 78.0–100.0)
Platelets: 110 10*3/uL — ABNORMAL LOW (ref 150–400)
Platelets: 138 10*3/uL — ABNORMAL LOW (ref 150–400)
Platelets: 187 10*3/uL (ref 150–400)
Platelets: 199 10*3/uL (ref 150–400)
Platelets: 228 10*3/uL (ref 150–400)
Platelets: 89 10*3/uL — ABNORMAL LOW (ref 150–400)
RBC: 2.71 MIL/uL — ABNORMAL LOW (ref 4.22–5.81)
RBC: 2.76 MIL/uL — ABNORMAL LOW (ref 4.22–5.81)
RBC: 2.81 MIL/uL — ABNORMAL LOW (ref 4.22–5.81)
RBC: 2.94 MIL/uL — ABNORMAL LOW (ref 4.22–5.81)
RBC: 3.02 MIL/uL — ABNORMAL LOW (ref 4.22–5.81)
RDW: 15.2 % (ref 11.5–15.5)
RDW: 15.4 % (ref 11.5–15.5)
RDW: 15.4 % (ref 11.5–15.5)
RDW: 15.6 % — ABNORMAL HIGH (ref 11.5–15.5)
RDW: 15.6 % — ABNORMAL HIGH (ref 11.5–15.5)
RDW: 15.9 % — ABNORMAL HIGH (ref 11.5–15.5)
WBC: 10 10*3/uL (ref 4.0–10.5)
WBC: 11.6 10*3/uL — ABNORMAL HIGH (ref 4.0–10.5)
WBC: 12.8 10*3/uL — ABNORMAL HIGH (ref 4.0–10.5)
WBC: 15.5 10*3/uL — ABNORMAL HIGH (ref 4.0–10.5)
WBC: 16.4 10*3/uL — ABNORMAL HIGH (ref 4.0–10.5)

## 2011-02-15 LAB — URINALYSIS, ROUTINE W REFLEX MICROSCOPIC
Bilirubin Urine: NEGATIVE
Glucose, UA: NEGATIVE mg/dL
Hgb urine dipstick: NEGATIVE
Ketones, ur: NEGATIVE mg/dL
Nitrite: NEGATIVE
Protein, ur: NEGATIVE mg/dL
Specific Gravity, Urine: 1.025 (ref 1.005–1.030)
Urobilinogen, UA: 0.2 mg/dL (ref 0.0–1.0)
pH: 5 (ref 5.0–8.0)

## 2011-02-15 LAB — BASIC METABOLIC PANEL
BUN: 15 mg/dL (ref 6–23)
BUN: 18 mg/dL (ref 6–23)
BUN: 20 mg/dL (ref 6–23)
BUN: 20 mg/dL (ref 6–23)
BUN: 22 mg/dL (ref 6–23)
CO2: 24 mEq/L (ref 19–32)
CO2: 26 mEq/L (ref 19–32)
CO2: 27 mEq/L (ref 19–32)
CO2: 27 mEq/L (ref 19–32)
CO2: 28 mEq/L (ref 19–32)
Calcium: 7.8 mg/dL — ABNORMAL LOW (ref 8.4–10.5)
Calcium: 8.1 mg/dL — ABNORMAL LOW (ref 8.4–10.5)
Calcium: 8.2 mg/dL — ABNORMAL LOW (ref 8.4–10.5)
Calcium: 8.2 mg/dL — ABNORMAL LOW (ref 8.4–10.5)
Calcium: 8.3 mg/dL — ABNORMAL LOW (ref 8.4–10.5)
Chloride: 102 mEq/L (ref 96–112)
Chloride: 106 mEq/L (ref 96–112)
Chloride: 108 mEq/L (ref 96–112)
Chloride: 108 mEq/L (ref 96–112)
Chloride: 109 mEq/L (ref 96–112)
Creatinine, Ser: 0.62 mg/dL (ref 0.4–1.5)
Creatinine, Ser: 0.67 mg/dL (ref 0.4–1.5)
Creatinine, Ser: 0.68 mg/dL (ref 0.4–1.5)
Creatinine, Ser: 0.69 mg/dL (ref 0.4–1.5)
Creatinine, Ser: 0.79 mg/dL (ref 0.4–1.5)
GFR calc Af Amer: >60 mL/min (ref >60)
GFR calc Af Amer: >60 mL/min (ref >60)
GFR calc Af Amer: >60 mL/min (ref >60)
GFR calc Af Amer: >60 mL/min (ref >60)
GFR calc Af Amer: >60 mL/min (ref >60)
GFR calc non Af Amer: >60 mL/min (ref >60)
GFR calc non Af Amer: >60 mL/min (ref >60)
GFR calc non Af Amer: >60 mL/min (ref >60)
GFR calc non Af Amer: >60 mL/min (ref >60)
GFR calc non Af Amer: >60 mL/min (ref >60)
Glucose, Bld: 100 mg/dL — ABNORMAL HIGH (ref 70–99)
Glucose, Bld: 104 mg/dL — ABNORMAL HIGH (ref 70–99)
Glucose, Bld: 111 mg/dL — ABNORMAL HIGH (ref 70–99)
Glucose, Bld: 114 mg/dL — ABNORMAL HIGH (ref 70–99)
Glucose, Bld: 126 mg/dL — ABNORMAL HIGH (ref 70–99)
Potassium: 2.9 mEq/L — ABNORMAL LOW (ref 3.5–5.1)
Potassium: 3.1 mEq/L — ABNORMAL LOW (ref 3.5–5.1)
Potassium: 3.3 mEq/L — ABNORMAL LOW (ref 3.5–5.1)
Potassium: 3.6 mEq/L (ref 3.5–5.1)
Potassium: 4 mEq/L (ref 3.5–5.1)
Sodium: 132 mEq/L — ABNORMAL LOW (ref 135–145)
Sodium: 137 mEq/L (ref 135–145)
Sodium: 139 mEq/L (ref 135–145)
Sodium: 139 mEq/L (ref 135–145)
Sodium: 140 mEq/L (ref 135–145)

## 2011-02-15 LAB — PROTIME-INR
INR: 1.2 (ref 0.00–1.49)
INR: 1.47 (ref 0.00–1.49)
INR: 1.73 — ABNORMAL HIGH (ref 0.00–1.49)
INR: 2.26 — ABNORMAL HIGH (ref 0.00–1.49)
INR: 2.37 — ABNORMAL HIGH (ref 0.00–1.49)
INR: 2.43 — ABNORMAL HIGH (ref 0.00–1.49)
Prothrombin Time: 15.1 seconds (ref 11.6–15.2)
Prothrombin Time: 17.7 seconds — ABNORMAL HIGH (ref 11.6–15.2)
Prothrombin Time: 21.3 seconds — ABNORMAL HIGH (ref 11.6–15.2)
Prothrombin Time: 24.8 seconds — ABNORMAL HIGH (ref 11.6–15.2)
Prothrombin Time: 25.7 seconds — ABNORMAL HIGH (ref 11.6–15.2)
Prothrombin Time: 26.2 seconds — ABNORMAL HIGH (ref 11.6–15.2)

## 2011-02-15 LAB — COMPREHENSIVE METABOLIC PANEL
ALT: 14 U/L (ref 0–53)
AST: 18 U/L (ref 0–37)
Albumin: 3 g/dL — ABNORMAL LOW (ref 3.5–5.2)
Alkaline Phosphatase: 42 U/L (ref 39–117)
BUN: 21 mg/dL (ref 6–23)
CO2: 28 mEq/L (ref 19–32)
Calcium: 8.6 mg/dL (ref 8.4–10.5)
Chloride: 107 mEq/L (ref 96–112)
Creatinine, Ser: 0.77 mg/dL (ref 0.4–1.5)
GFR calc Af Amer: >60 mL/min (ref >60)
GFR calc non Af Amer: >60 mL/min (ref >60)
Glucose, Bld: 114 mg/dL — ABNORMAL HIGH (ref 70–99)
Potassium: 4.3 mEq/L (ref 3.5–5.1)
Sodium: 138 mEq/L (ref 135–145)
Total Bilirubin: 0.8 mg/dL (ref 0.3–1.2)
Total Protein: 5.8 g/dL — ABNORMAL LOW (ref 6.0–8.3)

## 2011-02-15 LAB — MAGNESIUM
Magnesium: 2.1 mg/dL (ref 1.5–2.5)
Magnesium: 2.3 mg/dL (ref 1.5–2.5)

## 2011-02-15 LAB — URINE CULTURE
Colony Count: NO GROWTH
Culture: NO GROWTH

## 2011-02-15 LAB — APTT: aPTT: 35 seconds (ref 24–37)

## 2011-02-15 LAB — TSH: TSH: 1.467 u[IU]/mL (ref 0.350–4.500)

## 2011-02-15 LAB — AMYLASE: Amylase: 43 U/L (ref 0–105)

## 2011-02-19 LAB — COMPREHENSIVE METABOLIC PANEL
ALT: 15 U/L (ref 0–53)
AST: 20 U/L (ref 0–37)
Alkaline Phosphatase: 50 U/L (ref 39–117)
CO2: 27 mEq/L (ref 19–32)
GFR calc Af Amer: >60 mL/min (ref >60)
GFR calc non Af Amer: >60 mL/min (ref >60)
Glucose, Bld: 119 mg/dL — ABNORMAL HIGH (ref 70–99)
Potassium: 4 mEq/L (ref 3.5–5.1)
Sodium: 137 mEq/L (ref 135–145)
Total Protein: 7.5 g/dL (ref 6.0–8.3)

## 2011-02-19 LAB — CBC
HCT: 28.7 % — ABNORMAL LOW (ref 39.0–52.0)
HCT: 29.4 % — ABNORMAL LOW (ref 39.0–52.0)
HCT: 29.6 % — ABNORMAL LOW (ref 39.0–52.0)
HCT: 29.8 % — ABNORMAL LOW (ref 39.0–52.0)
HCT: 42.4 % (ref 39.0–52.0)
Hemoglobin: 10 g/dL — ABNORMAL LOW (ref 13.0–17.0)
Hemoglobin: 10 g/dL — ABNORMAL LOW (ref 13.0–17.0)
Hemoglobin: 10.1 g/dL — ABNORMAL LOW (ref 13.0–17.0)
Hemoglobin: 13.4 g/dL (ref 13.0–17.0)
Hemoglobin: 14.4 g/dL (ref 13.0–17.0)
Hemoglobin: 9.7 g/dL — ABNORMAL LOW (ref 13.0–17.0)
MCHC: 33.8 g/dL (ref 30.0–36.0)
MCHC: 33.9 g/dL (ref 30.0–36.0)
MCHC: 33.9 g/dL (ref 30.0–36.0)
MCHC: 34 g/dL (ref 30.0–36.0)
MCHC: 34.1 g/dL (ref 30.0–36.0)
MCV: 95.3 fL (ref 78.0–100.0)
MCV: 96 fL (ref 78.0–100.0)
MCV: 96.3 fL (ref 78.0–100.0)
MCV: 96.7 fL (ref 78.0–100.0)
MCV: 97.1 fL (ref 78.0–100.0)
Platelets: 101 10*3/uL — ABNORMAL LOW (ref 150–400)
Platelets: 104 10*3/uL — ABNORMAL LOW (ref 150–400)
Platelets: 122 10*3/uL — ABNORMAL LOW (ref 150–400)
Platelets: 145 10*3/uL — ABNORMAL LOW (ref 150–400)
Platelets: 85 10*3/uL — ABNORMAL LOW (ref 150–400)
Platelets: 96 10*3/uL — ABNORMAL LOW (ref 150–400)
RBC: 2.97 MIL/uL — ABNORMAL LOW (ref 4.22–5.81)
RBC: 3.07 MIL/uL — ABNORMAL LOW (ref 4.22–5.81)
RBC: 3.08 MIL/uL — ABNORMAL LOW (ref 4.22–5.81)
RBC: 3.09 MIL/uL — ABNORMAL LOW (ref 4.22–5.81)
RBC: 4.41 MIL/uL (ref 4.22–5.81)
RDW: 14.8 % (ref 11.5–15.5)
RDW: 15 % (ref 11.5–15.5)
RDW: 15.3 % (ref 11.5–15.5)
RDW: 15.4 % (ref 11.5–15.5)
RDW: 15.4 % (ref 11.5–15.5)
RDW: 16 % — ABNORMAL HIGH (ref 11.5–15.5)
WBC: 11.5 10*3/uL — ABNORMAL HIGH (ref 4.0–10.5)
WBC: 13.6 10*3/uL — ABNORMAL HIGH (ref 4.0–10.5)
WBC: 14.7 10*3/uL — ABNORMAL HIGH (ref 4.0–10.5)
WBC: 16.4 10*3/uL — ABNORMAL HIGH (ref 4.0–10.5)
WBC: 8.1 10*3/uL (ref 4.0–10.5)

## 2011-02-19 LAB — POCT I-STAT 3, ART BLOOD GAS (G3+)
Acid-Base Excess: 1 mmol/L (ref 0.0–2.0)
Bicarbonate: 21.9 mEq/L (ref 20.0–24.0)
Bicarbonate: 22.9 mEq/L (ref 20.0–24.0)
Bicarbonate: 24.1 mEq/L — ABNORMAL HIGH (ref 20.0–24.0)
O2 Saturation: 99 %
Patient temperature: 37.4
TCO2: 23 mmol/L (ref 0–100)
pCO2 arterial: 35.7 mmHg (ref 35.0–45.0)
pCO2 arterial: 45.1 mmHg — ABNORMAL HIGH (ref 35.0–45.0)
pH, Arterial: 7.382 (ref 7.350–7.450)
pH, Arterial: 7.405 (ref 7.350–7.450)
pH, Arterial: 7.407 (ref 7.350–7.450)
pO2, Arterial: 58 mmHg — ABNORMAL LOW (ref 80.0–100.0)

## 2011-02-19 LAB — POCT I-STAT 4, (NA,K, GLUC, HGB,HCT)
Glucose, Bld: 108 mg/dL — ABNORMAL HIGH (ref 70–99)
Glucose, Bld: 85 mg/dL (ref 70–99)
Glucose, Bld: 97 mg/dL (ref 70–99)
HCT: 27 % — ABNORMAL LOW (ref 39.0–52.0)
HCT: 27 % — ABNORMAL LOW (ref 39.0–52.0)
HCT: 30 % — ABNORMAL LOW (ref 39.0–52.0)
HCT: 31 % — ABNORMAL LOW (ref 39.0–52.0)
HCT: 36 % — ABNORMAL LOW (ref 39.0–52.0)
Hemoglobin: 10.2 g/dL — ABNORMAL LOW (ref 13.0–17.0)
Hemoglobin: 9.2 g/dL — ABNORMAL LOW (ref 13.0–17.0)
Hemoglobin: 9.2 g/dL — ABNORMAL LOW (ref 13.0–17.0)
Hemoglobin: 9.2 g/dL — ABNORMAL LOW (ref 13.0–17.0)
Potassium: 3.8 mEq/L (ref 3.5–5.1)
Potassium: 4.5 mEq/L (ref 3.5–5.1)
Potassium: 4.6 mEq/L (ref 3.5–5.1)
Potassium: 5.1 mEq/L (ref 3.5–5.1)
Sodium: 132 mEq/L — ABNORMAL LOW (ref 135–145)
Sodium: 138 mEq/L (ref 135–145)

## 2011-02-19 LAB — URINALYSIS, ROUTINE W REFLEX MICROSCOPIC
Hgb urine dipstick: NEGATIVE
Nitrite: NEGATIVE
Protein, ur: NEGATIVE mg/dL
Specific Gravity, Urine: 1.019 (ref 1.005–1.030)
Urobilinogen, UA: 1 mg/dL (ref 0.0–1.0)

## 2011-02-19 LAB — CREATININE, SERUM
Creatinine, Ser: 0.77 mg/dL (ref 0.4–1.5)
Creatinine, Ser: 0.89 mg/dL (ref 0.4–1.5)
GFR calc Af Amer: >60 mL/min (ref >60)
GFR calc Af Amer: >60 mL/min (ref >60)
GFR calc non Af Amer: >60 mL/min (ref >60)
GFR calc non Af Amer: >60 mL/min (ref >60)

## 2011-02-19 LAB — BASIC METABOLIC PANEL
BUN: 13 mg/dL (ref 6–23)
BUN: 14 mg/dL (ref 6–23)
BUN: 19 mg/dL (ref 6–23)
CO2: 22 mEq/L (ref 19–32)
CO2: 28 mEq/L (ref 19–32)
Calcium: 8.1 mg/dL — ABNORMAL LOW (ref 8.4–10.5)
Calcium: 9.1 mg/dL (ref 8.4–10.5)
Calcium: 9.6 mg/dL (ref 8.4–10.5)
Chloride: 101 mEq/L (ref 96–112)
Chloride: 104 mEq/L (ref 96–112)
Creatinine, Ser: 0.78 mg/dL (ref 0.4–1.5)
Creatinine, Ser: 0.85 mg/dL (ref 0.4–1.5)
GFR calc Af Amer: >60 mL/min (ref >60)
GFR calc Af Amer: >60 mL/min (ref >60)
GFR calc non Af Amer: >60 mL/min (ref >60)
GFR calc non Af Amer: >60 mL/min (ref >60)
GFR calc non Af Amer: >60 mL/min (ref >60)
Glucose, Bld: 157 mg/dL — ABNORMAL HIGH (ref 70–99)
Glucose, Bld: 207 mg/dL — ABNORMAL HIGH (ref 70–99)
Glucose, Bld: 94 mg/dL (ref 70–99)
Potassium: 3.7 mEq/L (ref 3.5–5.1)
Potassium: 4.1 mEq/L (ref 3.5–5.1)
Sodium: 135 mEq/L (ref 135–145)
Sodium: 139 mEq/L (ref 135–145)
Sodium: 142 mEq/L (ref 135–145)

## 2011-02-19 LAB — CK TOTAL AND CKMB (NOT AT ARMC)
CK, MB: 7.6 ng/mL (ref 0.3–4.0)
Relative Index: 3.9 — ABNORMAL HIGH (ref 0.0–2.5)
Total CK: 195 U/L (ref 7–232)

## 2011-02-19 LAB — BLOOD GAS, ARTERIAL
Bicarbonate: 24.9 mEq/L — ABNORMAL HIGH (ref 20.0–24.0)
FIO2: 0.21 %
O2 Saturation: 99.2 %
pCO2 arterial: 37.1 mmHg (ref 35.0–45.0)
pO2, Arterial: 114 mmHg — ABNORMAL HIGH (ref 80.0–100.0)

## 2011-02-19 LAB — GLUCOSE, CAPILLARY
Glucose-Capillary: 100 mg/dL — ABNORMAL HIGH (ref 70–99)
Glucose-Capillary: 128 mg/dL — ABNORMAL HIGH (ref 70–99)
Glucose-Capillary: 134 mg/dL — ABNORMAL HIGH (ref 70–99)
Glucose-Capillary: 152 mg/dL — ABNORMAL HIGH (ref 70–99)
Glucose-Capillary: 178 mg/dL — ABNORMAL HIGH (ref 70–99)
Glucose-Capillary: 78 mg/dL (ref 70–99)
Glucose-Capillary: 96 mg/dL (ref 70–99)
Glucose-Capillary: 99 mg/dL (ref 70–99)

## 2011-02-19 LAB — APTT
aPTT: 28 seconds (ref 24–37)
aPTT: 38 seconds — ABNORMAL HIGH (ref 24–37)

## 2011-02-19 LAB — POCT I-STAT 3, VENOUS BLOOD GAS (G3P V)
Acid-base deficit: 1 mmol/L (ref 0.0–2.0)
pCO2, Ven: 44.1 mmHg — ABNORMAL LOW (ref 45.0–50.0)
pO2, Ven: 57 mmHg — ABNORMAL HIGH (ref 30.0–45.0)

## 2011-02-19 LAB — TYPE AND SCREEN
ABO/RH(D): A POS
Antibody Screen: NEGATIVE

## 2011-02-19 LAB — POCT I-STAT, CHEM 8
BUN: 13 mg/dL (ref 6–23)
Calcium, Ion: 1.26 mmol/L (ref 1.12–1.32)
Chloride: 108 mEq/L (ref 96–112)
Creatinine, Ser: 0.7 mg/dL (ref 0.4–1.5)
Glucose, Bld: 189 mg/dL — ABNORMAL HIGH (ref 70–99)
HCT: 30 % — ABNORMAL LOW (ref 39.0–52.0)
HCT: 30 % — ABNORMAL LOW (ref 39.0–52.0)
Hemoglobin: 10.2 g/dL — ABNORMAL LOW (ref 13.0–17.0)
Potassium: 3.9 mEq/L (ref 3.5–5.1)
Sodium: 136 mEq/L (ref 135–145)
TCO2: 24 mmol/L (ref 0–100)

## 2011-02-19 LAB — HEMOGLOBIN AND HEMATOCRIT, BLOOD: Hemoglobin: 8.9 g/dL — ABNORMAL LOW (ref 13.0–17.0)

## 2011-02-19 LAB — PROTIME-INR
INR: 1.04 (ref 0.00–1.49)
INR: 1.53 — ABNORMAL HIGH (ref 0.00–1.49)
Prothrombin Time: 13.5 seconds (ref 11.6–15.2)
Prothrombin Time: 18.3 seconds — ABNORMAL HIGH (ref 11.6–15.2)

## 2011-02-19 LAB — LIPID PANEL
Cholesterol: 169 mg/dL (ref 0–200)
LDL Cholesterol: 90 mg/dL (ref 0–99)

## 2011-02-19 LAB — PLATELET INHIBITION P2Y12
P2Y12 % Inhibition: 23 %
Platelet Function  P2Y12: 218 [PRU] (ref 194–418)
Platelet Function Baseline: 284 [PRU] (ref 194–418)

## 2011-02-19 LAB — MAGNESIUM
Magnesium: 2 mg/dL (ref 1.5–2.5)
Magnesium: 2 mg/dL (ref 1.5–2.5)
Magnesium: 2.2 mg/dL (ref 1.5–2.5)

## 2011-02-19 LAB — HEMOGLOBIN A1C: Hgb A1c MFr Bld: 5.6 % (ref 4.6–6.1)

## 2011-02-24 ENCOUNTER — Telehealth: Payer: Self-pay | Admitting: *Deleted

## 2011-02-24 DIAGNOSIS — K4090 Unilateral inguinal hernia, without obstruction or gangrene, not specified as recurrent: Secondary | ICD-10-CM

## 2011-02-24 NOTE — Telephone Encounter (Signed)
Left detailed msg on pt voicemail referral placed.

## 2011-03-22 ENCOUNTER — Other Ambulatory Visit (HOSPITAL_COMMUNITY): Payer: Medicare Other

## 2011-03-27 ENCOUNTER — Ambulatory Visit (HOSPITAL_COMMUNITY): Admission: RE | Admit: 2011-03-27 | Payer: Medicare Other | Source: Ambulatory Visit | Admitting: Surgery

## 2011-04-05 ENCOUNTER — Encounter (HOSPITAL_COMMUNITY)
Admission: RE | Admit: 2011-04-05 | Discharge: 2011-04-05 | Disposition: A | Discharge status: Home / Self Care | Payer: Medicare Other | Source: Ambulatory Visit | Attending: Surgery | Admitting: Surgery

## 2011-04-05 ENCOUNTER — Other Ambulatory Visit (HOSPITAL_COMMUNITY): Payer: Self-pay | Admitting: Surgery

## 2011-04-05 ENCOUNTER — Ambulatory Visit (HOSPITAL_COMMUNITY)
Admission: RE | Admit: 2011-04-05 | Discharge: 2011-04-05 | Disposition: A | Discharge status: Home / Self Care | Payer: Medicare Other | Source: Ambulatory Visit | Attending: Surgery | Admitting: Surgery

## 2011-04-05 DIAGNOSIS — K4090 Unilateral inguinal hernia, without obstruction or gangrene, not specified as recurrent: Secondary | ICD-10-CM | POA: Insufficient documentation

## 2011-04-05 DIAGNOSIS — Z01818 Encounter for other preprocedural examination: Secondary | ICD-10-CM | POA: Insufficient documentation

## 2011-04-05 DIAGNOSIS — J984 Other disorders of lung: Secondary | ICD-10-CM | POA: Insufficient documentation

## 2011-04-05 DIAGNOSIS — Z01812 Encounter for preprocedural laboratory examination: Secondary | ICD-10-CM | POA: Insufficient documentation

## 2011-04-05 LAB — COMPREHENSIVE METABOLIC PANEL
ALT: 14 U/L (ref 0–53)
AST: 20 U/L (ref 0–37)
Albumin: 3.8 g/dL (ref 3.5–5.2)
Alkaline Phosphatase: 52 U/L (ref 39–117)
CO2: 33 mEq/L — ABNORMAL HIGH (ref 19–32)
Chloride: 102 mEq/L (ref 96–112)
GFR calc Af Amer: >60 mL/min (ref >60)
GFR calc non Af Amer: >60 mL/min (ref >60)
Potassium: 4.7 mEq/L (ref 3.5–5.1)
Sodium: 140 mEq/L (ref 135–145)
Total Bilirubin: 0.4 mg/dL (ref 0.3–1.2)

## 2011-04-05 LAB — DIFFERENTIAL
Eosinophils Absolute: 0.6 10*3/uL (ref 0.0–0.7)
Eosinophils Relative: 7 % — ABNORMAL HIGH (ref 0–5)
Lymphocytes Relative: 23 % (ref 12–46)
Lymphs Abs: 1.9 10*3/uL (ref 0.7–4.0)
Monocytes Absolute: 0.7 10*3/uL (ref 0.1–1.0)

## 2011-04-05 LAB — CBC
HCT: 41.9 % (ref 39.0–52.0)
MCV: 94.6 fL (ref 78.0–100.0)
Platelets: 162 10*3/uL (ref 150–400)

## 2011-04-10 ENCOUNTER — Ambulatory Visit (HOSPITAL_COMMUNITY)
Admission: RE | Admit: 2011-04-10 | Discharge: 2011-04-10 | Disposition: A | Discharge status: Home / Self Care | Payer: Medicare Other | Source: Ambulatory Visit | Attending: Surgery | Admitting: Surgery

## 2011-04-10 DIAGNOSIS — Z01810 Encounter for preprocedural cardiovascular examination: Secondary | ICD-10-CM | POA: Insufficient documentation

## 2011-04-10 DIAGNOSIS — K4090 Unilateral inguinal hernia, without obstruction or gangrene, not specified as recurrent: Secondary | ICD-10-CM | POA: Insufficient documentation

## 2011-04-10 DIAGNOSIS — Z01818 Encounter for other preprocedural examination: Secondary | ICD-10-CM | POA: Insufficient documentation

## 2011-04-10 DIAGNOSIS — Z01812 Encounter for preprocedural laboratory examination: Secondary | ICD-10-CM | POA: Insufficient documentation

## 2011-04-10 DIAGNOSIS — I10 Essential (primary) hypertension: Secondary | ICD-10-CM | POA: Insufficient documentation

## 2011-04-10 DIAGNOSIS — Z951 Presence of aortocoronary bypass graft: Secondary | ICD-10-CM | POA: Insufficient documentation

## 2011-04-11 NOTE — Op Note (Signed)
NAMEAIMEE, TIMMONS                   ACCOUNT NO.:  [id number]  MEDICAL RECORD NO.:  [id number]           PATIENT TYPE:  O  LOCATION:  SDSC                         FACILITY:  [facility]  PHYSICIAN:  Jaylah Goodlow A. Chosen Garron, M.D.DATE OF BIRTH:  20-Apr-1930  DATE OF PROCEDURE:  04/10/2011 DATE OF DISCHARGE:  04/10/2011                              OPERATIVE REPORT   PREOPERATIVE DIAGNOSIS:  Left inguinal hernia, symptomatic.  POSTOPERATIVE DIAGNOSIS:  Left inguinal hernia, symptomatic.  PROCEDURE:  Repair of left inguinal hernia with Ultrapro hernia system mesh.  SURGEON:  Maisie Fus A. Krystel Fletchall, MD  ANESTHESIA:  LMA with 0.25% Sensorcaine local.  ESTIMATED BLOOD LOSS:  Minimal.  SPECIMENS:  None.  INDICATIONS FOR PROCEDURE:  The patient is a 77 year old male with a chronic painful left inguinal hernia that has been reducible.  We discussed options of open versus laparoscopic versus observation as treatment of this.  He is having no pain, he wished to have repair. Risk of bleeding, infection, organ injury, injury to the cord structures, testicle, bladder, bowel, major vascular structures of the iliac artery and vein, smaller vessels and bladder were discussed.  He understood the above potential complications and wished to proceed. Long-term expectations as well as potential chronic groin pain discussed.  DESCRIPTION OF THE PROCEDURE:  The patient is brought to the operating room.  Left side was marked in the holding area prior to doing this. After induction of general anesthesia, the left inguinal region was prepped and draped in a sterile fashion.  An oblique incision was made along the left inguinal crease just proximal to the external ring opening.  Dissection was carried down to the fatty tissues until the superficial epigastric vessels were identified and divided with 3-0 Vicryl ties.  We then identified the aponeurosis of the external oblique.  We infiltrated this with 0.25%  Sensorcaine local using 10 mL with epinephrine.  Incision was made in the direction of fibers through the external ring opening of the inguinal canal.  Cord structures were identified and circled with 1-1/2 inch Penrose drain.  There was an indirect hernia sac noted and this was quite adherent to the cord structures and took some time to dissect it away.  We opened the hernia sac.  There were no contents within it from the intra-abdominal cavity and I closed it with a 2-0 Vicryl stitch.  I then reduced the hernia sac back into the preperitoneal space.  I then used my finger to sweep around the space and it was quite open.  A large Ultrapro hernia system mesh was brought in the field.  The anterior leaflet was placed in the preperitoneal space and the onlay was pulled snug on to the floor of the inguinal canal.  I secured the connection of the inlay and the onlay to the internal oblique with a 2-0 Novafil stitch.  The onlay was then laid on the floor of the inguinal canal and was secured to the pubic tubercle.  The shelving edge of the inguinal ligament and to the internal oblique and conjoined tendon with 2-0 Novafil sutures interrupted.  Slit was cut,  the cord structures were then laid through this quite nicely and we closed the mesh around the cord structures with the same stitch.  There was no tension on the repair.  The ilioinguinal nerve was identified.  It was ligated as it exited the muscle to prevent postoperative pain.  There was ample room for the cord structures to exit the mesh with no constriction.  Hemostasis was excellent.  We then closed the aponeurosis of the external oblique with a 2-0 Vicryl. Scarpa was reapproximated with 3-0 Vicryl, and 4-0 Monocryl was used to close the skin in subcuticular fashion.  Dermabond was applied.  All final counts of sponge, needle, and instrument were found to be correct in this portion of the case.  The patient was awoke, extubated, taken  to recovery in satisfactory condition.     Dexter Sauser A. Arabelle Bollig, M.D.     TAC/MEDQ  D:  04/10/2011  T:  04/10/2011  Job:  [id number]  cc:   Titus Dubin. Alwyn Ren, MD,FACP,FCCP  Electronically Signed by Harriette Bouillon M.D. on 04/11/2011 01:46:34 PM

## 2011-04-11 NOTE — Op Note (Signed)
NAMESHERI, PROWS                   ACCOUNT NO.:  [id number]   MEDICAL RECORD NO.:  [id number]          PATIENT TYPE:  INP   LOCATION:  1825                         FACILITY:  [facility]   PHYSICIAN:  Lubertha Basque. Dalldorf, M.D.DATE OF BIRTH:  July 07, 1930   DATE OF PROCEDURE:  DATE OF DISCHARGE:                               OPERATIVE REPORT   PREOPERATIVE DIAGNOSIS:  Right femur, subtrochanteric fracture.   POSTOPERATIVE DIAGNOSIS:  Right femur, subtrochanteric fracture.   PROCEDURE:  Right femur reconstruction-nail.   ANESTHESIA:  General.   ATTENDING SURGEON:  Lubertha Basque. Jerl Santos, M.D.   ASSISTANT:  Lindwood Qua, P.A.   INDICATIONS FOR PROCEDURE:  The patient is a 77 year old man who injured  his leg working in the yard this afternoon.  He was unable to get up and  was taken to the emergency room by ambulance.  X-rays revealed a  displaced subtrochanteric fracture.  He is offered ORIF in hopes of  stabilizing his hip and allowing him to sit and stand and potentially  walk.  Informed operative consent was obtained after discussion of  possible complications of reaction to anesthesia, infection, DVT, PE,  nonunion, and death.   SUMMARY OF FINDINGS AND PROCEDURE:  Under general anesthesia, a right  proximal femur fracture was addressed.  We stabilized this under  fluoroscopic guidance with a [employer] 13 x 42 right femoral nail,  placed in reconstruction fashion with two proximal screws and one distal  locking screw.  I used fluoroscopy throughout the case to make  appropriate intraoperative decisions; I read all these views myself.  Lindwood Qua assisted throughout and was invaluable to the  completion of the case in that he helped position and retract while I  performed the procedure.  He also closed simultaneously to help minimize  OR time.  We did perform a brief open reduction of his fracture and  stabilized with a clamp to assure anatomic reduction during reaming and  placement of the rod.   DESCRIPTION OF PROCEDURE:  The patient was taken to the operating suite  where general anesthetic was applied without difficulty.  It is  positioned on the fracture table and prepped and draped in normal  sterile fashion.  With some traction and abduction, we were able to  reduce this fracture in what appeared to be a fairly anatomic fashion.  We then placed a guidewire to the greater trochanter through our  incision, just proximal to this fracture.  With the reduction device, I  was able to pass this down the femoral canal to the knee.  We over-  reamed with a starter reamer and then sequentially reamed up to 14.5  with plans to place a 13-mm rod.  We then started to place the rod, but  lost our reduction.  I elected to make a small incision in the area of  the fracture from lateral with dissection down to the bone.  I placed a  reduction clamp across the spiral fracture anatomically reducing the  fracture.  I then reamed once again and placed the rod appropriately  with the femur remaining in the anatomic position.  We then used the  outrigger device to place two proximal screws, both 115 mm in length.  These were seen to enter the femoral head centrally on two fluoroscopic  views, AP, and lateral.  I then placed the distal locking screws through  a separate stab wound and this was 45-mm in length.  The wounds were all  thoroughly irrigated followed by reapproximation of deep tissues with 0-  Vicryl and subcutaneous tissues with 2-0 undyed Vicryl.  Skin was closed  with staples at all three incisions.  Adaptic was applied followed by  dry gauze and tape.  Estimated blood loss and fluids, obtained from  anesthesia records.   DISPOSITION:  The patient was extubated in the operating room and taken  to the recovery room in stable condition.  He is to be admitted to the  Orthopedic Surgery service for appropriate postoperative care to include  perioperative  antibiotic, and Coumadin plus Lovenox for DVT prophylaxis.      Lubertha Basque Jerl Santos, M.D.  Electronically Signed     PGD/MEDQ  D:  03/30/2008  T:  03/31/2008  Job:  [id number]

## 2011-04-11 NOTE — Assessment & Plan Note (Signed)
OFFICE VISIT   [name], [name]  DOB:  26-Apr-1930                                        March 21, 2010  CHART #:  [id number]   HISTORY OF PRESENT ILLNESS:  The patient is status post coronary artery  bypass grafting x3 done by Dr. Tyrone Sage on February 23, 2010.  The patient  postoperatively did go into atrial fibrillation/flutter.  Unfortunately,  the patient remained in AFib/flutter requiring starting on Coumadin as  well as continuing him on a small Multaq, digoxin, and metoprolol.  The  patient went to skilled nursing facility at time of discharge and has  since returned home with his wife and currently has a home health  nurse/aide 24/7.  He presents today for his followup visit.  The patient  is without complaints.  He states he feels great.  He is up ambulating  several times a day without difficulty.  He denies any sternal  incisional pain, shortness of breath, fevers, nausea, vomiting, opening  or drainage from any of his incision sites.  He has been to see Dr.  Eden Emms who plans to follow up with the patient next 2 or 3 months.  Per  the patient, Dr. Eden Emms felt that he was progressing well.  The patient  is on Coumadin and has been obtaining his PT/INR blood work which is  being followed by Dr. Fabio Bering office and Coumadin adjusted.  Currently,  he does not remember his last INR.  The patient is tolerating diet well.  He is sleeping well at night.  He states that Physical Therapy is coming  out to his house tomorrow to assess and see if he is a candidate for  home PT.  The patient plans to proceed with cardiac rehab once set up.   PHYSICAL EXAMINATION:  Vital Signs:  Blood pressure 108/68, pulse 60,  respirations 16, O2 sats 98% on room air.  Respiratory:  Decreased  breath sounds left base.  Cardiac:  Irregularly regular.  No murmur  noted.  Positive slight click midsternum.  Abdomen:  Bowel sounds x4.  Soft, nontender.  Extremities:  Mild edema noted.   Warm and well  perfused.  Incisions all clean, dry, and intact and healing well.   STUDIES:  The patient had a PA and lateral chest x-ray done today which  shows improvement in his aeration.  Improved pneumothorax noted today.  There is a mild left pleural effusion greater than right noted.  The  nodular opacity in the left mid upper lung field is noted to be stable.   IMPRESSION AND PLAN:  The patient is progressing quite well status post  CABG.  He is to continue ambulating 3-4 times per day.  He is told still  no heavy lifting over 10 pounds.  At this point, until the patient  becomes stronger, we will hold off on releasing him to drive.  He is to  continue taking his current medications.  I did give him a prescription  for 7 days of Lasix 40 mg daily and potassium 20 mEq daily for 7 days.  I also given a Medrol Dosepak.  Plan to bring the patient back into the  office in 1 week with a repeat chest x-ray for follow up of his pleural  effusion.  The patient will also need to follow up  regarding this  nodular obesity in the left mid upper lung field by Dr. Tyrone Sage.  The  patient is instructed if he has any surgical issues in the interim, he  is to contact us.  The patient is in agreement.   Sheliah Plane, MD  Electronically Signed   KMD/MEDQ  D:  03/21/2010  T:  03/22/2010  Job:  [id number]   cc:   [name]. Eden Emms, MD, [facility]

## 2011-04-11 NOTE — Discharge Summary (Signed)
NAMECALAHAN, PAK NO.:  [id number]   MEDICAL RECORD NO.:  [id number]          PATIENT TYPE:  INP   LOCATION:  5019                         FACILITY:  [facility]   PHYSICIAN:  Lindwood Qua, P.A. DATE OF BIRTH:  December 11, 1929   DATE OF ADMISSION:  03/30/2008  DATE OF DISCHARGE:  04/06/2008                               DISCHARGE SUMMARY   ADMITTING DIAGNOSIS:  Subtrochanteric right hip fracture.   DISCHARGE DIAGNOSIS:  Subtrochanteric right hip fracture with blood loss  anemia.   BRIEF HISTORY:  Mr. Gaby is a 77 year old white male patient who  presented to the emergency room.  He was doing yard work the day of  admission when he fell on his right hip.  He was not able to get up, he  was transported by EMS to the ED.  At the time of presentation to the  ED, he had pain in his right hip area, inability to move, where some  shortening x-rays were taken and was noted to have a display  subtrochanteric fracture of the right hip.  We discussed treatment  options with him that being ORIF.   PERTINENT LABORATORY AND X-RAY FINDINGS:  Blood was transfused as  necessary.  Labs testing, his hemoglobin was 9.5, hematocrit 27.1, WBC  is 8.9, platelets 184.  Chem 7, sodium 139, potassium of 3.4, BUN 20,  creatinine 0.73, glucose 103, and INR 2.0.   HOSPITAL COURSE:  He was admitted from the emergency room.  After the  operation, he was placed on a lot of p.o. and IM analgesics for pain  including a hydromorphone PCA pump, IV Ancef 1 g q. 8 h. x3 doses,  pharmacy for low-dose Coumadin and Lovenox protocol for DVT prophylaxis  and he could be up with the help of physical therapy to be partial  weightbearing.  He also had appropriate antispasmodics, antiemetics, and  diet to be advanced as tolerated, ice to his hip, knee, TEDs, and  incentive spirometry except for the first day postop.   PHYSICAL EXAMINATION:  VITAL SIGNS:  Blood pressure 105/66, which  remained stable  throughout his hospital stay.  Temperature 99, which  also remained stable.  Hemoglobin 9.7 and INR 1.2.  LUNGS:  Clear.  ABDOMEN:  Soft.  EXTREMITIES:  Right hip wound was noted to be benign.  The second day,  his dressing was changed.  Wounds were benign.  No signs of infection.  No irritation.  NEUROVASCULAR:  Normal neurovascular status.  The patient initially did  not want a blood transfusion, but because of dizziness and low  hemoglobin-blood loss anemia, we have felt it was necessary and treated  him with 2 units of packed RBCs.  He had some difficulty with  constipation.  He was voiding well.  The other issue was going home  versus skilled nursing and rehab placement.  He had the desire to go  home, but his progress with physical therapy was extremely slow and it  was felt at the end of this time in hospital that he would be best  served by  a skilled nursing facility short-term placement as has his  wife had taken ill with an upper respiratory tract infection and was not  able to take care of him at home.   CONDITION ON DISCHARGE:  Improved.   FOLLOWUP:  He will remain on a low-sodium, heart healthy diet, and may  change his dressing daily.  May be partial weightbearing with the help  of physical therapy.  Vicodin for pain 1 or 2 q. 4-6h as needed and  Coumadin dose regulated by pharmacy for a total of 4 weeks from surgery.   DISCHARGE MEDICATIONS:  Colace 100 mg 1 p.o. b.i.d., ferrous sulfate 325  mg 1 a day, Protonix 40 mg 1 a day, laxative or enema p.r.n., Tylenol  325-650 mg q. 4h. for temperature elevation, Robaxin 500 mg 1 p.o. q.  8h. p.r.n. for spasm, and Phenergan 12.5 mg p.o. q. 4-6h. p.r.n. nausea  or vomiting.  He will remain with staples in for a total of 2 weeks from  his surgery.  He can follow up in our office in 3-4 weeks postop.  Any  sign of infection, which would be increasing redness, drainage,  increasing pain, to call and we would be happy to see me  immediately.      Lindwood Qua, P.A.     MC/MEDQ  D:  04/05/2008  T:  04/06/2008  Job:  [id number]

## 2011-04-11 NOTE — Assessment & Plan Note (Signed)
OFFICE VISIT   Eric, [name]  DOB:  1930-06-14                                        Mar 28, 2010  CHART #:  [id number]   HISTORY:  The patient is a 77 year old gentleman who underwent coronary  artery bypass grafting x3 on February 23, 2010 for unstable angina with  severe coronary artery disease.  He was seen last week in routine  followup and noted to have a moderate left-sided pleural effusion.  He  was treated with a course of both oral Lasix and a steroid taper and has  presented back to the office on today's date for reevaluation.  Currently, he denies shortness of breath.  He states that overall he is  feeling stronger and continues to improve.  He has had no fevers,  chills, diarrhea, nausea, vomiting, or other constitutional symptoms.  His ambulation is increasing and tolerating well.  Overall, he feels as  though his progress has been good and he has received some symptomatic  relief from the treatment.   DIAGNOSTIC TESTS:  A chest x-ray was obtained on today's date.  It  reveals a tiny left-sided pleural effusion that is improved from the  previous exam.  There are no other significant findings.   PHYSICAL EXAMINATION:  Vital Signs:  Blood pressure is 105/64, pulse 58,  respirations 16, and oxygen saturation is 98% on room air.  General:  This is an elderly white male, in no acute distress.  Incisions are  inspected, healing well without evidence of infection.  Pulmonary:  Clear breath sounds.  Cardiac:  Regular rate and rhythm.  No gallops, no  rubs.  Extremities:  No peripheral edema.   ASSESSMENT:  The patient has made continued improvement and his recovery  is going quite well.  He does not need to continue the diuretics.  He  will continue to follow up with Dr. Eden Emms  and his primary physicians.  We will see him at this point on a p.r.n.  basis for any surgical related-issues or as requested.   [name], P.A.-C.   [name]  D:   03/28/2010  T:  03/29/2010  Job:  [id number]   cc:   [name]. Eden Emms, MD, [facility]

## 2011-04-11 NOTE — H&P (Signed)
[name], [name]                   ACCOUNT NO.:  [id number]   MEDICAL RECORD NO.:  [id number]          PATIENT TYPE:  INP   LOCATION:  0112                         FACILITY[facility]   PHYSICIAN:  [name], MDDATE OF BIRTH:  [birth year]   DATE OF ADMISSION:  09/09/2008  DATE OF DISCHARGE:                              HISTORY & PHYSICAL   PRIMARY CARE PHYSICIAN:  Eric Najjar, MD, [place].   CHIEF COMPLAINT:  Nausea, vomiting, loose bowel movements and cough.  The patient has been having cough productive of greenish sputum for  about a week now; so he went to see his regular [name] and was treated  with amoxicillin for potential bronchitis.  Shortly thereafter he  developed nausea, vomiting and occasional loose bowel movements.  He  presented on September 06, 2008 to [facility] emergency department, where  he was given fluids.  Since he was having some occasional chest pain and  shortness of breath, we got a CAT scan which did not show any pneumonia  or PE and then he was discharged to home.   He comes back again because he still has persistent nausea, vomiting and  loose bowel movements.  He reports having 5 loose BMs today.  No fevers,  no chills and no abdominal pain.  He is worried as there is some blood  in his vomit, which is brown blood and maybe some motor oil-like  flakes.  There was no bright red blood and no blood in stool.  The  patient does not take any medications currently.  He has stopped taking  his amoxicillin.   REVIEW OF SYSTEMS:  Otherwise, the review of systems was unremarkable.  No chest pain.  The patient is slightly short of breath and still  coughing.  Review of systems otherwise unremarkable.   PAST MEDICAL HISTORY:  Significant for history of osteoporosis and the  recent diagnosis of bronchitis.   SOCIAL HISTORY:  The patient lives with his spouse.  He does not abuse  drugs.  He does not drink, does not smoke.  He used to smoke as a  teenager, but quit a long time ago.   FAMILY HISTORY:  Noncontributory.   ALLERGIES:  NO KNOWN DRUG ALLERGIES.   MEDICATIONS:  Currently not taking any.   PHYSICAL EXAMINATION:  VITALS:  Temperature 97.4, blood pressure 123/70,  pulse 91, respirations 24, saturations 99% on room air.  The patient  appears to be in no acute distress.  An elderly male lying down in the  bed.  HEENT:  Nontraumatic.  Having dryish mucous membranes.  There is a  vomitus on the blanket that has brown specks in it; no red specks.  LUNGS:  Clear to auscultation bilaterally.  No wheezes and no crackles  noted.  Somewhat decreased air movement though.  HEART:  Regular rate and rhythm.  No murmurs, rubs or gallops.  ABDOMEN:  Soft, nontender and nondistended.  No epigastric tenderness  noted.  EXTREMITIES:  Lower extremities without clubbing, cyanosis or edema.  NEUROLOGIC:  Cranial nerves II-XII intact.  Strength 5/5  in all 4  extremities.   LABS:  White blood cell count 11.1, hemoglobin 14.9.  Sodium 135,  potassium 4.1, creatinine 0.88.  LFTs are within normal limits.  Lipase  19.  CT scan done on September 06, 2008 showing no PE and no pneumonia,  but some areas of scarring.   ASSESSMENT AND PLAN:  This is a 77 year old gentleman with persistent  nausea, vomiting and diarrhea with some dehydration, after receiving a  course of amoxicillin.  1. Nausea, Vomiting, Diarrhea.  Differential with possible viral      gastroenteritis versus reaction to antibiotic; which is somewhat      less likely as this is persisting.  Will admit for observation.      Give IV fluids.  Check orthostatics.  This patient is concerning      that there could be a possibility of blood in his vomitus, although      I do not see any frank blood.  Will Hemoccult stools.  Obtain KUB      to rule out obstruction or ileus.  Since the patient is having      diarrhea and had a recent exposure to antibiotics, will check stool      C.  difficile, stool white blood cell count and stool culture.  2. Bronchitis.  Since the cough persists, we will make sure there is      no pneumonia now.  Will check chest x-ray.  Give guaifenesin p.r.n.      and albuterol as needed.   PROPHYLAXIS:  SCDs and Protonix.      Eric Cowboy, MD  Electronically Signed     AVD/MEDQ  D:  09/09/2008  T:  09/10/2008  Job:  [id number]   cc:   Eric Najjar, MD

## 2011-04-14 NOTE — Assessment & Plan Note (Signed)
 HEALTHCARE                          [facility] OFFICE NOTE   NAME:[name], [name]                          MRN:          [id number]  DATE:06/18/2006                            DOB:          June 18, 1930    [name] has had symptoms of low back syndrome for which he was initially  seen April 27, 2006.  He had lifted buckets of water, working in his yard.  He  denied any paresthesias or incontinence.  The pain was in the right flank  without radiation, better with Tylenol and worse in the morning.  It did get  better through the day with activity.  He exhibited the classic crawl up  from the exam table.  He was tender in the right flank.  He was noted to  have a minor rash with erythematous papules 18 cm above the area of pain.  This rash blanched with pressure and was nontender; there were no vesicles.  Deep tendon reflexes were one-half plus.   A short course of prednisone and isometric exercises were recommended along  with Flexeril and extra-strength Tylenol.  He was asked to monitor for  progression to frank herpes zoster.   He was seen on May 21, 2006, for rhinosinusitis symptoms and May 25, 2006,  with chills and diarrhea with the possibility of C. difficile.  He was  placed on Flagyl at that time.   He returned on June 18, 2006, for follow-up after x-rays were performed at  [facility].  He could not lay in the left lateral decubitus position  to complete the films due to muscular pain.  He was having pain in the left  flank but below the ribs and thorax, like a knife pick.  It did not occur  while laying in the right lateral decubitus position.  The incomplete films  did reveal partial compression fracture of T-8 and T-12 with osteopenia.  Again, he had no paresthesias.  He was able to complete heel and toe  walking.  He could not lay down due to the pain.   CBC and differential, serum calcium, and alkaline phosphatase were  all  normal.  The urine for Bence-Jones protein was negative.  He was scheduled  for MRI of the thoracic spine to assess the compression fractures.  Pending  are the MRI which will dictate referral to neurosurgeon or other such  specialist.                                   Titus Dubin. Alwyn Ren, MD, [facility]   [facility]  DD:  07/03/2006  DT:  07/03/2006  Job #:  [id number]

## 2011-04-14 NOTE — Discharge Summary (Signed)
NAMESAYF, KERNER                   ACCOUNT NO.:  [id number]   MEDICAL RECORD NO.:  [id number]          PATIENT TYPE:  INP   LOCATION:  1342                         FACILITY:  [facility]   PHYSICIAN:  Raenette Rover. Felicity Coyer, MDDATE OF BIRTH:  08/12/1930   DATE OF ADMISSION:  09/09/2008  DATE OF DISCHARGE:  09/12/2008                               DISCHARGE SUMMARY   DISCHARGE DIAGNOSES:  1. Gastroenteritis with nausea, vomiting and diarrhea.  2. Dehydration secondary to problem number one.  3. Anemia of chronic disease.   HISTORY OF PRESENT ILLNESS:  Mr. Stelle is a 77 year old white male with  past medical history of osteoporosis and recent diagnosis of bronchitis.  The patient presented to [facility] emergency room with reports of 1-  week history of nausea, vomiting and loose stool; also with recent  productive cough treated by primary care physician with amoxicillin for  suspected bronchitis.  Shortly after beginning amoxicillin,  the patient reports developing the nausea, vomiting and occasional loose  stool.  Upon evaluation in the ER, white cell count was found to be  11.1.  Electrolytes were within normal limits.  CT scan of the chest  done September 06, 2008 revealed no pulmonary embolism and no pneumonia.  Due to the patient's age, the patient was admitted to the hospital for  persistent nausea, vomiting and diarrhea.   PAST MEDICAL HISTORY:  1. Osteoporosis.  2. Recent bronchitis.   COURSE OF HOSPITALIZATION.:  Gastroenteritis with nausea, vomiting and  diarrhea:  A KUB checked at time of admission was negative for any acute  findings.  Chest x-ray checked on admission with no acute findings;  however did reveal multifocal pulmonary opacities also seen on CT angio  of the chest September 06, 2008 consistent with scarring.  However,  radiologist did recommend 102-month follow-up to be arranged by primary  care physician.  In regards to the patient's nausea, vomiting and  diarrhea,  symptoms thought secondary to viral gastroenteritis as liver  function tests and pancreatic enzymes within normal limits as well as no  acute findings on KUB.  The patient was hydrated gently with IV normal  saline.  Diet was advanced as tolerated.  At time of discharge, the  patient's symptoms had resolved.  The patient was felt medically stable  for discharge home as the patient was tolerating regular meals without  any recurrent nausea or vomiting.   MEDICATIONS:  At time of discharge:  Zofran 4 mg p.o. q.8 hours p.r.n. nausea.   DISPOSITION:  The patient felt medically stable for discharge home as  his admit symptoms have now resolved and likely secondary to viral  gastroenteritis.  The patient is instructed to call his primary care  physician, Dr. Marga Melnick, to schedule a follow-up appointment as  needed.      Cordelia Pen, NP      Raenette Rover. Felicity Coyer, MD  Electronically Signed    [facility]/MED[facility]  D:  09/25/2008  T:  09/25/2008  Job:  [id number]   cc:   Titus Dubin. Alwyn Ren, MD,FACP,FCCP  [phone]  [place]  [place] [address]

## 2011-04-26 ENCOUNTER — Telehealth: Payer: Self-pay | Admitting: Cardiovascular Disease

## 2011-04-26 MED ORDER — METOPROLOL TARTRATE 25 MG PO TABS: 25.0000 mg | ORAL_TABLET | Freq: Two times a day (BID) | ORAL | Status: DC

## 2011-04-26 MED ORDER — FOLIC ACID 1 MG PO TABS: 1.0000 mg | ORAL_TABLET | Freq: Every day | ORAL | Status: DC

## 2011-04-26 NOTE — Telephone Encounter (Signed)
Addended by: Kem Parkinson on: 04/26/2011 03:59 PM   Modules accepted: Orders

## 2011-04-26 NOTE — Telephone Encounter (Signed)
Folic Acid 1 mg #30 and Lopressor 25 mg # 60 [facility]

## 2011-05-03 ENCOUNTER — Encounter: Payer: Self-pay | Admitting: Cardiovascular Disease

## 2011-05-03 ENCOUNTER — Encounter: Payer: Self-pay | Admitting: *Deleted

## 2011-05-04 ENCOUNTER — Encounter: Payer: Self-pay | Admitting: Cardiovascular Disease

## 2011-05-04 ENCOUNTER — Ambulatory Visit (INDEPENDENT_AMBULATORY_CARE_PROVIDER_SITE_OTHER): Payer: Medicare Other | Admitting: Cardiovascular Disease

## 2011-05-04 DIAGNOSIS — I4891 Unspecified atrial fibrillation: Secondary | ICD-10-CM

## 2011-05-04 DIAGNOSIS — I2510 Atherosclerotic heart disease of native coronary artery without angina pectoris: Secondary | ICD-10-CM

## 2011-05-04 DIAGNOSIS — E782 Mixed hyperlipidemia: Secondary | ICD-10-CM

## 2011-05-04 NOTE — Assessment & Plan Note (Signed)
Maint NSR no palpitations  Continue ASA  

## 2011-05-04 NOTE — Progress Notes (Signed)
[name] is seen today post CABG. 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 Post op course was complicated by afib that was hard to control. He was sent home on Dig, BB and Multaq He converted and has maintained NSR. F/U colonoscopy was fine.  Had uncomplicated left hernia surgery with Dr Davina Poke this year.  No arrhythmia or SSCP.  Compliant with meds.  Will be moving with wife to assisted living soon.  ROS: Denies fever, malais, weight loss, blurry vision, decreased visual acuity, cough, sputum, SOB, hemoptysis, pleuritic pain, palpitaitons, heartburn, abdominal pain, melena, lower extremity edema, claudication, or rash.  All other systems reviewed and negative  General: Affect appropriate Healthy:  appears stated age HEENT: normal Neck supple with no adenopathy JVP normal no bruits no thyromegaly Lungs clear with no wheezing and good diaphragmatic motion Heart:  S1/S2 no murmur,rub, gallop or click PMI normal Abdomen: benighn, BS positve, no tenderness, no AAA no bruit.  No HSM or HJR Distal pulses intact with no bruits No edema Neuro non-focal Skin warm and dry No muscular weakness   Current Outpatient Prescriptions  Medication Sig Dispense Refill  . aspirin 325 MG tablet Take 325 mg by mouth daily.        . Calcium Carbonate Antacid (TUMS PO) as needed.        . Cholecalciferol (VITAMIN D3) 1000 UNITS CAPS Take 1,000 Units by mouth.        . folic acid (FOLVITE) 1 MG tablet Take 1 tablet (1 mg total) by mouth daily.  30 tablet  12  . metoprolol tartrate (LOPRESSOR) 25 MG tablet Take 1 tablet (25 mg total) by mouth 2 (two) times daily.  60 tablet  11  . Multiple Vitamin (MULTIVITAMIN) tablet Take 1 tablet by mouth daily.        . polycarbophil (FIBERCON) 625 MG tablet Take 625 mg by mouth daily.        . ranitidine (ZANTAC) 150 MG tablet Take 150 mg by mouth as needed.       . rosuvastatin (CRESTOR) 10 MG tablet Take 10 mg by mouth daily.        [name] DISCONTD:  hyoscyamine (LEVSIN SL) 0.125 MG SL tablet Place 0.125 mg under the tongue every 6 (six) hours as needed.        [name] DISCONTD: metoprolol succinate (TOPROL-XL) 25 MG 24 hr tablet Take 25 mg by mouth 2 (two) times daily.          Allergies  Amoxicillin  Electrocardiogram:  Assessment and Plan

## 2011-05-04 NOTE — Assessment & Plan Note (Signed)
Cholesterol is at goal.  Continue current dose of statin and diet Rx.  No myalgias or side effects.  F/U  LFT's in 6 months. Lab Results  Component Value Date   LDLCALC 46 05/20/2010             

## 2011-05-04 NOTE — Assessment & Plan Note (Signed)
Stable with no angina and good activity level.  Continue medical Rx  

## 2011-05-16 ENCOUNTER — Ambulatory Visit (INDEPENDENT_AMBULATORY_CARE_PROVIDER_SITE_OTHER): Payer: Medicare Other | Admitting: Internal Medicine

## 2011-05-16 ENCOUNTER — Encounter: Payer: Self-pay | Admitting: Internal Medicine

## 2011-05-16 DIAGNOSIS — I2510 Atherosclerotic heart disease of native coronary artery without angina pectoris: Secondary | ICD-10-CM

## 2011-05-16 DIAGNOSIS — Z0000 Encounter for general adult medical examination without abnormal findings: Secondary | ICD-10-CM

## 2011-05-16 DIAGNOSIS — E785 Hyperlipidemia, unspecified: Secondary | ICD-10-CM

## 2011-05-16 DIAGNOSIS — K219 Gastro-esophageal reflux disease without esophagitis: Secondary | ICD-10-CM

## 2011-05-16 DIAGNOSIS — Z8679 Personal history of other diseases of the circulatory system: Secondary | ICD-10-CM

## 2011-05-16 DIAGNOSIS — Z111 Encounter for screening for respiratory tuberculosis: Secondary | ICD-10-CM

## 2011-05-16 DIAGNOSIS — Z23 Encounter for immunization: Secondary | ICD-10-CM

## 2011-05-16 DIAGNOSIS — D649 Anemia, unspecified: Secondary | ICD-10-CM

## 2011-05-16 LAB — POCT URINALYSIS DIPSTICK
Ketones, UA: NEGATIVE
Protein, UA: NEGATIVE
Spec Grav, UA: 1.01
Urobilinogen, UA: 0.2
pH, UA: 7

## 2011-05-16 MED ORDER — TETANUS-DIPHTH-ACELL PERTUSSIS 5-2.5-18.5 LF-MCG/0.5 IM SUSP
0.5000 mL | Freq: Once | INTRAMUSCULAR | Status: AC
  Administered 2011-05-16: 0.5 mL via INTRAMUSCULAR

## 2011-05-16 NOTE — Patient Instructions (Addendum)
Preventive Health Care: Exercise at least 30-45 minutes a day,  3-4 days a week.  Eat a low-fat diet with lots of fruits and vegetables, up to 7-9 servings per day. Consume less than 40 grams of sugar per day from foods & drinks with High Fructose Corn Sugar as #2,3 or # 4 on label. Health Care Power of Attorney & Living Will. Complete if not in place ; these place you in charge of your health care decisions. The triggers for dyspepsia or "heart burn"  include stress; the "aspirin family" ; alcohol; peppermint; and caffeine (coffee, tea, cola, and chocolate). The aspirin family would include aspirin and the nonsteroidal agents such as ibuprofen &  Naproxen. Tylenol would not cause reflux. If having dyspepsia ; food & dink should be avoided for @ least 2 hours before going to bed. If food"'sticks" repeatedly  a Gastroenterology evaluation is indicated.

## 2011-05-16 NOTE — Progress Notes (Signed)
Subjective:    Patient ID: [name], male    DOB: 01-22-1930, 77 y.o.   MRN: [id number]  HPI Medicare Wellness Visit:  The following psychosocial & medical history were reviewed as required by Medicare.   Social history: caffeine: none , alcohol:   2 oz / day ,  tobacco use : quit 1964(1 ppd X 10 years)  & exercise : decreased due to care giving needs for Anza.   Home & personal  safety / fall risk: no issues, activities of daily living: no limitations , seatbelt use : yes , and smoke alarm employment : yes .  Power of Attorney/Living Will status : pending today  Vision ( as recorded per Nurse) & Hearing  evaluation :  Hearing normal to whisper @ 6 ft. Orientation :Oriented X 3 , memory & recall :good, spelling or math testing: math good,and mood & affect : normal . Depression / anxiety: denied Travel history : ? , immunization status :up to date , transfusion history: with hip replacement 2009, and preventive health surveillance ( colonoscopies, BMD , etc as per protocol/ SOC): up to date, Dental care:  5/12 . Chart reviewed &  Updated. Active issues reviewed & addressed. S/P hernia repair 5 weeks ago.        Review of Systems  Some dysphagia with bread  which responds to increased fluids. Leg cramps rarely; this resolves with standing up. Intermittent nasal congestion; no meds employed.     Objective:   Physical Exam Gen.: Thin but healthy and well-nourished in appearance. Alert, appropriate and cooperative throughout exam. Head: Normocephalic without obvious abnormalities Eyes: No corneal or conjunctival inflammation noted. Pupils equal round reactive to light and accommodation.  Ears: External  ear exam reveals no significant lesions or deformities. Canals clear .TMs normal. Hearing is grossly normal bilaterally. Nose: External nasal exam reveals no deformity or inflammation. Nasal mucosa are pink and moist. No lesions or exudates noted. Septum   Minimal dislocation  to L  Mouth:  Oral mucosa and oropharynx reveal no lesions or exudates. Teeth in good repair. Neck: No deformities, masses, or tenderness noted. Range of motion &. Thyroid normal. Lungs: Normal respiratory effort; chest expands symmetrically. Lungs are clear to auscultation without rales, wheezes, or increased work of breathing. Heart: Normal rate and rhythm. Normal S1 and S2. No gallop,  or rub. Loud click was S4 with slurring @ apex w/o murmur. Abdomen: Bowel sounds normal; abdomen soft and nontender. No masses, organomegaly or hernias notSed. DRE/Genitalia: deferred( he has seen Dr Vonita Moss, Urologist)                                                                                     Musculoskeletal/extremities: No deformity or scoliosis noted of  the thoracic or lumbar spine. No clubbing, cyanosis, edema, or deformity noted. Range of motion  Normal even @ hips .Tone & strength  Nail health  good. Vascular: Carotid, radial artery, dorsalis pedis and dorsalis posterior tibial pulses are full and equal. No bruits present. Neurologic: Alert and oriented x3. Deep tendon reflexes symmetrical and normal.         Skin: Intact without suspicious lesions or  rashes. Lymph: No cervical or  Axillary  lymphadenopathy present. Psych: Mood and affect are normal. Normally interactive                                                                                        Assessment & Plan:  #1 Medicare Wellness Exam; criteria met ; data entered #2 Problem List reviewed ; Assessment/ Recommendations made Plan: see Orders

## 2011-05-17 ENCOUNTER — Encounter: Payer: Self-pay | Admitting: Internal Medicine

## 2011-05-18 LAB — TB SKIN TEST: TB Skin Test: NEGATIVE mm

## 2011-05-18 MED ORDER — PNEUMOCOCCAL VAC POLYVALENT 25 MCG/0.5ML IJ INJ
0.5000 mL | INJECTION | Freq: Once | INTRAMUSCULAR | Status: AC
  Administered 2011-05-18: 0.5 mL via INTRAMUSCULAR

## 2011-05-18 NOTE — Progress Notes (Signed)
Addended by: Edgardo Roys on: 05/18/2011 12:05 PM   Modules accepted: Orders

## 2011-06-12 ENCOUNTER — Encounter: Payer: Self-pay | Admitting: Internal Medicine

## 2011-06-12 ENCOUNTER — Ambulatory Visit (INDEPENDENT_AMBULATORY_CARE_PROVIDER_SITE_OTHER): Payer: Medicare Other | Admitting: Internal Medicine

## 2011-06-12 VITALS — BP 104/62 | HR 54 | Wt 156.2 lb

## 2011-06-12 DIAGNOSIS — I2510 Atherosclerotic heart disease of native coronary artery without angina pectoris: Secondary | ICD-10-CM

## 2011-06-12 DIAGNOSIS — I4891 Unspecified atrial fibrillation: Secondary | ICD-10-CM

## 2011-06-12 DIAGNOSIS — I499 Cardiac arrhythmia, unspecified: Secondary | ICD-10-CM

## 2011-06-12 LAB — CALCIUM: Calcium: 9.6 mg/dL (ref 8.4–10.5)

## 2011-06-12 LAB — T4, FREE: Free T4: 0.79 ng/dL (ref 0.60–1.60)

## 2011-06-12 LAB — MAGNESIUM: Magnesium: 2.7 mg/dL — ABNORMAL HIGH (ref 1.5–2.5)

## 2011-06-12 LAB — POTASSIUM: Potassium: 5 mEq/L (ref 3.5–5.1)

## 2011-06-12 LAB — TSH: TSH: 1.45 u[IU]/mL (ref 0.35–5.50)

## 2011-06-12 LAB — CK TOTAL AND CKMB (NOT AT ARMC): Total CK: 100 U/L (ref 7–232)

## 2011-06-12 NOTE — Patient Instructions (Signed)
Avoid stimulants :decongestants, diet pills, nicotine, caffeine( coffee, tea,cola, chocolate) to excess to prevent  palpitations .  See the nurse immediately if the palpitations recur and persist.

## 2011-06-12 NOTE — Progress Notes (Signed)
  Subjective:    Patient ID: [name], male    DOB: [birth year], 77 y.o.   MRN: [id number]  HPI Palpitations:  Onset: 06/08/2011 Trigger: increased stress with moving into [facility] Duration: constant for > 3 days; resolved 7/15 upon awakening Treatment: none PMH :3 vessel  CABG 3/12 ; perioperative AF FH: Mother ? AF Labs reviewed: no TSH; chemistries normal  Constitutional: no Fever, chills, sweats, weight change,   sleep issues. Some fatigue with above Cardiovascular:no Chest pain, syncope, diaphoresis, claudication GI:no Change in bowels, anorexia Derm: no Skin, hair, or nail changes Neurologic:no Tremor, numbness and tingling Psych: some Anxiety; no depression or  panic attacks Endocrine:no Hoarseness, temperature intolerance Possible triggers:no New medications,excess  stimulants(decongestants, diet pills, nicotine, caffeine)     Review of Systems     Objective:   Physical Exam Gen.: Thin but healthy and well-nourished in appearance. Alert, appropriate and cooperative throughout exam. Eyes: No corneal or conjunctival inflammation noted. Pupils equal round reactive to light and accommodation.  Extraocular motion intact.  Neck: No deformities, masses, or tenderness noted.  Thyroid small; firm w/o nodules. Lungs: Normal respiratory effort; chest expands symmetrically. Lungs are clear to auscultation without rales, wheezes, or increased work of breathing. Heart: Normal rate and rhythm. Split S1 ; normal S2. No gallop, click, or rub. No murmur. Abdomen: Bowel sounds normal; abdomen soft and nontender. No masses, organomegaly or hernias noted.                                                                                   Musculoskeletal/extremities: No clubbing, cyanosis, edema noted. Tone & strength  Normal.Joints: DIP OA changes. Nail health  good. Vascular: Carotid, radial artery, dorsalis pedis and  posterior tibial pulses are full and equal. No bruits  present. Neurologic: Alert and oriented x3. Deep tendon reflexes symmetrical  But 0+ @ knees.         Skin: Intact without suspicious lesions or rashes. Lymph: No cervical, axillary  lymphadenopathy present. Psych: Mood and affect are normal. Normally interactive                                                                                         Assessment & Plan:  #1 palpitations sustained over 3 days. EKG reveals sinus bradycardia ( rate 51) with incomplete right bundle branch block.  #2 coronary disease, status post three-vessel bypass  #3 atrial fibrillation perioperatively, past medical history  Plan: check chemistries, thyroid function, and cardiac enzymes. Avoidance of stimulants.

## 2011-06-13 LAB — TROPONIN I: Troponin I: <0.01 ng/mL (ref <0.06)

## 2011-06-30 ENCOUNTER — Other Ambulatory Visit: Payer: Self-pay | Admitting: *Deleted

## 2011-06-30 MED ORDER — ROSUVASTATIN CALCIUM 10 MG PO TABS: 10.0000 mg | ORAL_TABLET | Freq: Every day | ORAL | Status: DC

## 2011-08-28 LAB — CLOSTRIDIUM DIFFICILE EIA

## 2011-08-28 LAB — CBC
HCT: 36.2 — ABNORMAL LOW
Hemoglobin: 13.7
Hemoglobin: 14.9
MCHC: 33.4
MCHC: 33.7
MCV: 94
Platelets: 216
Platelets: 239
RBC: 3.82 — ABNORMAL LOW
RBC: 4.69
RDW: 14.8
WBC: 11.1 — ABNORMAL HIGH
WBC: 9

## 2011-08-28 LAB — URINALYSIS, ROUTINE W REFLEX MICROSCOPIC
Bilirubin Urine: NEGATIVE
Ketones, ur: >80 — AB
Nitrite: NEGATIVE
Protein, ur: NEGATIVE
Urobilinogen, UA: 0.2

## 2011-08-28 LAB — COMPREHENSIVE METABOLIC PANEL
ALT: 22
ALT: 34
AST: 26
AST: 31
Albumin: 3.7
Alkaline Phosphatase: 58
BUN: 12
CO2: 21
CO2: 21
Calcium: 9.2
Chloride: 101
Chloride: 109
Creatinine, Ser: 0.75
GFR calc Af Amer: >60
GFR calc Af Amer: >60
GFR calc non Af Amer: >60
GFR calc non Af Amer: >60
Glucose, Bld: 102 — ABNORMAL HIGH
Potassium: 4.1
Sodium: 134 — ABNORMAL LOW
Sodium: 135
Total Bilirubin: 1.4 — ABNORMAL HIGH
Total Bilirubin: 1.7 — ABNORMAL HIGH
Total Protein: 6.9

## 2011-08-28 LAB — STOOL CULTURE

## 2011-08-28 LAB — URINE CULTURE: Colony Count: 7000

## 2011-08-28 LAB — DIFFERENTIAL
Basophils Absolute: 0.1
Basophils Relative: 1
Basophils Relative: 1
Eosinophils Absolute: 0.1
Eosinophils Absolute: 0.2
Eosinophils Relative: 1
Lymphs Abs: 1.3
Monocytes Absolute: 0.9
Monocytes Relative: 10
Neutrophils Relative %: 75
Neutrophils Relative %: 81 — ABNORMAL HIGH

## 2011-08-28 LAB — FECAL LACTOFERRIN, QUANT

## 2011-08-28 LAB — CK TOTAL AND CKMB (NOT AT ARMC)
CK, MB: 1.4
Total CK: 68

## 2011-08-28 LAB — PROTIME-INR: INR: 1.3

## 2011-08-28 LAB — APTT: aPTT: 29

## 2011-08-28 LAB — LIPASE, BLOOD: Lipase: 19

## 2011-12-04 ENCOUNTER — Emergency Department (INDEPENDENT_AMBULATORY_CARE_PROVIDER_SITE_OTHER): Payer: Medicare Other

## 2011-12-04 ENCOUNTER — Ambulatory Visit (INDEPENDENT_AMBULATORY_CARE_PROVIDER_SITE_OTHER): Payer: Medicare Other | Admitting: Family Medicine

## 2011-12-04 ENCOUNTER — Encounter: Payer: Self-pay | Admitting: Family Medicine

## 2011-12-04 ENCOUNTER — Other Ambulatory Visit: Payer: Self-pay

## 2011-12-04 ENCOUNTER — Encounter (HOSPITAL_BASED_OUTPATIENT_CLINIC_OR_DEPARTMENT_OTHER): Payer: Self-pay | Admitting: Family Medicine

## 2011-12-04 ENCOUNTER — Emergency Department (HOSPITAL_BASED_OUTPATIENT_CLINIC_OR_DEPARTMENT_OTHER)
Admission: EM | Admit: 2011-12-04 | Discharge: 2011-12-04 | Disposition: A | Discharge status: Home / Self Care | Payer: Medicare Other | Source: Home / Self Care | Attending: Emergency Medicine | Admitting: Emergency Medicine

## 2011-12-04 DIAGNOSIS — J111 Influenza due to unidentified influenza virus with other respiratory manifestations: Secondary | ICD-10-CM | POA: Insufficient documentation

## 2011-12-04 DIAGNOSIS — M810 Age-related osteoporosis without current pathological fracture: Secondary | ICD-10-CM | POA: Insufficient documentation

## 2011-12-04 DIAGNOSIS — I2510 Atherosclerotic heart disease of native coronary artery without angina pectoris: Secondary | ICD-10-CM | POA: Insufficient documentation

## 2011-12-04 DIAGNOSIS — K589 Irritable bowel syndrome without diarrhea: Secondary | ICD-10-CM | POA: Insufficient documentation

## 2011-12-04 DIAGNOSIS — R110 Nausea: Secondary | ICD-10-CM

## 2011-12-04 DIAGNOSIS — I4891 Unspecified atrial fibrillation: Secondary | ICD-10-CM | POA: Insufficient documentation

## 2011-12-04 DIAGNOSIS — E785 Hyperlipidemia, unspecified: Secondary | ICD-10-CM | POA: Insufficient documentation

## 2011-12-04 DIAGNOSIS — Z79899 Other long term (current) drug therapy: Secondary | ICD-10-CM | POA: Insufficient documentation

## 2011-12-04 DIAGNOSIS — J438 Other emphysema: Secondary | ICD-10-CM

## 2011-12-04 DIAGNOSIS — R112 Nausea with vomiting, unspecified: Secondary | ICD-10-CM | POA: Insufficient documentation

## 2011-12-04 DIAGNOSIS — R05 Cough: Secondary | ICD-10-CM

## 2011-12-04 DIAGNOSIS — R0602 Shortness of breath: Secondary | ICD-10-CM | POA: Insufficient documentation

## 2011-12-04 DIAGNOSIS — R1110 Vomiting, unspecified: Secondary | ICD-10-CM

## 2011-12-04 DIAGNOSIS — R5381 Other malaise: Secondary | ICD-10-CM | POA: Insufficient documentation

## 2011-12-04 DIAGNOSIS — R509 Fever, unspecified: Secondary | ICD-10-CM

## 2011-12-04 DIAGNOSIS — R5383 Other fatigue: Secondary | ICD-10-CM | POA: Insufficient documentation

## 2011-12-04 LAB — DIFFERENTIAL
Basophils Relative: 0 % (ref 0–1)
Eosinophils Absolute: 0 10*3/uL (ref 0.0–0.7)
Monocytes Absolute: 0.7 10*3/uL (ref 0.1–1.0)
Monocytes Relative: 6 % (ref 3–12)
Neutro Abs: 8.5 10*3/uL — ABNORMAL HIGH (ref 1.7–7.7)

## 2011-12-04 LAB — BASIC METABOLIC PANEL
CO2: 27 mEq/L (ref 19–32)
Calcium: 9.6 mg/dL (ref 8.4–10.5)
Chloride: 99 mEq/L (ref 96–112)
Glucose, Bld: 99 mg/dL (ref 70–99)
Sodium: 139 mEq/L (ref 135–145)

## 2011-12-04 LAB — CBC
HCT: 39.4 % (ref 39.0–52.0)
Hemoglobin: 13.5 g/dL (ref 13.0–17.0)
MCH: 31.3 pg (ref 26.0–34.0)
MCHC: 34.3 g/dL (ref 30.0–36.0)

## 2011-12-04 MED ORDER — OSELTAMIVIR PHOSPHATE 75 MG PO CAPS: 75.0000 mg | ORAL_CAPSULE | Freq: Two times a day (BID) | ORAL | Status: DC

## 2011-12-04 MED ORDER — ONDANSETRON HCL 4 MG/2ML IJ SOLN
INTRAMUSCULAR | Status: AC
  Filled 2011-12-04: qty 2

## 2011-12-04 MED ORDER — AZITHROMYCIN 250 MG PO TABS: 250.0000 mg | ORAL_TABLET | Freq: Every day | ORAL | Status: DC

## 2011-12-04 MED ORDER — ONDANSETRON 8 MG PO TBDP: 8.0000 mg | ORAL_TABLET | Freq: Three times a day (TID) | ORAL | Status: DC | PRN

## 2011-12-04 MED ORDER — ALBUTEROL SULFATE HFA 108 (90 BASE) MCG/ACT IN AERS
2.0000 | INHALATION_SPRAY | RESPIRATORY_TRACT | Status: DC | PRN
  Administered 2011-12-04: 2 via RESPIRATORY_TRACT
  Filled 2011-12-04: qty 6.7

## 2011-12-04 MED ORDER — ONDANSETRON HCL 4 MG/2ML IJ SOLN
4.0000 mg | Freq: Once | INTRAMUSCULAR | Status: AC
  Administered 2011-12-04: 13:00:00 via INTRAVENOUS

## 2011-12-04 MED ORDER — SODIUM CHLORIDE 0.9 % IV SOLN
INTRAVENOUS | Status: DC
  Administered 2011-12-04 (×2): via INTRAVENOUS

## 2011-12-04 NOTE — ED Provider Notes (Signed)
Care assumed from Dr. Brooke Dare.  Labs returned okay.  Will discharge to home.    Geoffery Lyons, MD 12/04/11 [phone]

## 2011-12-04 NOTE — ED Notes (Signed)
Pt. Called [facility] to come and get him

## 2011-12-04 NOTE — ED Notes (Signed)
Per EMS, pt coming from [facility] PCP for "flu-like symptoms". Pt c/o n/v and congestion since yesterday. EMS gave 4mg  Zofran IV, 20guage in Left forearm.

## 2011-12-04 NOTE — Assessment & Plan Note (Signed)
New.  Likely due to either PNA vs flu like illness.  Needs IVF.  EMS called.

## 2011-12-04 NOTE — Assessment & Plan Note (Signed)
Pt's sxs are severe- in some respiratory distress.  + exposure.  EMS called to transport pt to hospital for further evaluation, CXR, labs, and fluids.

## 2011-12-04 NOTE — ED Provider Notes (Signed)
History     CSN: [id number]  Arrival date & time 12/04/11  1225   First MD Initiated Contact with Patient 12/04/11 1403      Chief Complaint  Patient presents with  . Influenza    (Consider location/radiation/quality/duration/timing/severity/associated sxs/prior treatment) Patient is a 78 y.o. male presenting with URI. The history is provided by the patient. No language interpreter was used.  URI The primary symptoms include fatigue, cough, nausea and vomiting. Primary symptoms do not include fever, headaches, sore throat, wheezing, abdominal pain, myalgias or arthralgias. The current episode started 6 to 7 days ago. This is a new problem. The problem has been gradually worsening.  The fatigue began 3 to 5 days ago. The fatigue has been unchanged since its onset.  The cough began 6 to 7 days ago. The cough is new. The cough is productive. The sputum is yellow.  Nausea began yesterday. The nausea is associated with eating. The nausea is exacerbated by food.  The onset of the illness is associated with exposure to sick contacts (wife admitted for influenza). Symptoms associated with the illness include congestion and rhinorrhea. The illness is not associated with chills. Risk factors for severe complications from URI include being elderly.    Past Medical History  Diagnosis Date  . History of heart bypass surgery   . Mixed hyperlipidemia   . UNSPECIFIED ANEMIA   . CAD   . Atrial fibrillation   . PREMATURE VENTRICULAR CONTRACTIONS   . Esophageal reflux   . IBS   . HYPERPLASIA, PROSTATE NOS W/URINARY OBST/LUTS   . LUMBAR RADICULOPATHY, RIGHT   . OSTEOPOROSIS   . Palpitations   . INGUINAL HERNIA   . Lichen planus 2006    Francesca Oman MD  . Diverticulosis     Diverticulitis 2004    Past Surgical History  Procedure Date  . Hip fracture surgery     Dr.Dahldorff  . Knee surgery     Right  . Hip fracture surgery 2009  . Inguinal hernia repair 2012    Dr Luisa Hart    Family  History  Problem Relation Age of Onset  . Breast cancer Sister   . Stroke Mother   . Heart disease Father   . Heart disease Sister 24    History  Substance Use Topics  . Smoking status: Former Smoker    Quit date: 11/27/1962  . Smokeless tobacco: Not on file  . Alcohol Use: Yes     2 oz wine/ night      Review of Systems  Constitutional: Positive for appetite change and fatigue. Negative for fever, chills and activity change.  HENT: Positive for congestion and rhinorrhea. Negative for sore throat, neck pain and neck stiffness.   Respiratory: Positive for cough and shortness of breath. Negative for wheezing.   Cardiovascular: Negative for chest pain and palpitations.  Gastrointestinal: Positive for nausea and vomiting. Negative for abdominal pain, diarrhea, constipation and blood in stool.  Genitourinary: Negative for dysuria, urgency, frequency and flank pain.  Musculoskeletal: Negative for myalgias, back pain and arthralgias.  Neurological: Negative for dizziness, weakness, light-headedness, numbness and headaches.  All other systems reviewed and are negative.    Allergies  Amoxicillin  Home Medications   Current Outpatient Rx  Name Route Sig Dispense Refill  . ASPIRIN 325 MG PO TABS Oral Take 325 mg by mouth daily.      . AZITHROMYCIN 250 MG PO TABS Oral Take 1 tablet (250 mg total) by mouth daily. Take first  2 tablets together, then 1 every day until finished. 6 tablet 0  . TUMS PO  as needed.      [name] VITAMIN D3 1000 UNITS PO CAPS Oral Take 1,000 Units by mouth.      . FOLIC ACID 1 MG PO TABS Oral Take 1 tablet (1 mg total) by mouth daily. 30 tablet 12  . METOPROLOL TARTRATE 25 MG PO TABS Oral Take 1 tablet (25 mg total) by mouth 2 (two) times daily. 60 tablet 11  . ONE-DAILY MULTI VITAMINS PO TABS Oral Take 1 tablet by mouth daily.      [name] ONDANSETRON 8 MG PO TBDP Oral Take 1 tablet (8 mg total) by mouth every 8 (eight) hours as needed for nausea. 20 tablet 0  .  OSELTAMIVIR PHOSPHATE 75 MG PO CAPS Oral Take 1 capsule (75 mg total) by mouth every 12 (twelve) hours. 10 capsule 0  . CALCIUM POLYCARBOPHIL 625 MG PO TABS Oral Take 625 mg by mouth daily.      [name] RANITIDINE HCL 150 MG PO TABS Oral Take 150 mg by mouth as needed.     [name] ROSUVASTATIN CALCIUM 10 MG PO TABS Oral Take 1 tablet (10 mg total) by mouth daily. 30 tablet 6    BP 134/63  Pulse 66  Temp(Src) 97.3 F (36.3 C) (Oral)  Resp 20  SpO2 100%  Physical Exam  Nursing note and vitals reviewed. Constitutional: He is oriented to person, place, and time. He appears well-developed and well-nourished. No distress.  HENT:  Head: Normocephalic and atraumatic.  Mouth/Throat: Oropharynx is clear and moist.  Eyes: Conjunctivae and EOM are normal. Pupils are equal, round, and reactive to light.  Neck: Normal range of motion. Neck supple.  Cardiovascular: Normal rate, regular rhythm, normal heart sounds and intact distal pulses.  Exam reveals no gallop and no friction rub.   No murmur heard. Pulmonary/Chest: Effort normal and breath sounds normal. No respiratory distress. He has no wheezes.  Abdominal: Soft. Bowel sounds are normal. There is no tenderness.  Musculoskeletal: Normal range of motion. He exhibits no tenderness.  Neurological: He is alert and oriented to person, place, and time. No cranial nerve deficit.  Skin: Skin is warm and dry. No rash noted.    ED Course  Procedures (including critical care time)   Date: 12/04/2011  Rate: 66  Rhythm: normal sinus rhythm and premature atrial contractions (PAC)  QRS Axis: normal  Intervals: normal  ST/T Wave abnormalities: normal  Conduction Disutrbances:right bundle branch block and is incomplete  Narrative Interpretation:   Old EKG Reviewed: unchanged  Labs Reviewed  CBC - Abnormal; Notable for the following:    Platelets 145 (*)    All other components within normal limits  DIFFERENTIAL - Abnormal; Notable for the following:     Neutrophils Relative 82 (*)    Neutro Abs 8.5 (*)    Lymphocytes Relative 11 (*)    All other components within normal limits  PRO B NATRIURETIC PEPTIDE  BASIC METABOLIC PANEL  I-STAT TROPONIN I  TROPONIN I  BASIC METABOLIC PANEL   Dg Chest 2 View  12/04/2011  *RADIOLOGY REPORT*  Clinical Data: Nausea.  Fever.  Prior CABG.  CHEST - 2 VIEW  Comparison: 04/05/2011  Findings: Scarring in both upper lobes has a nodular components in the left upper lobe, but is not significantly changed from 2010 and is likely benign.  Underlying emphysema noted.  Prior CABG noted along with a stable mid-thoracic compression fracture.  No edema is evident.  IMPRESSION:  1.  Chronic bilateral scattered scarring, favoring the upper lobes, similar to prior exams.  No acute findings. 2.  Stable mid-thoracic compression fracture. 3.  Emphysema.  Original Report Authenticated By: Dellia Cloud, M.D.     1. Influenza-like illness       MDM  Patient with influenza-like illness with exposure recently. Chest x-ray skeletal unremarkable as was his lab work. There are couple laboratory tests that are pending therefore I will sign the patient out to my colleague Dr Judd Lien who provided appropriate disposition for the patient. The patient states he is feeling much better. He received a total of 8 mg of Zofran has been able to tolerate by mouth. We will await the remainder of his testing however anticipate discharge home. A single troponin was performed giving persistent shortness of breath since the onset of this illness. He has no chest pain. I have low concern for ACS nasal single troponin is adequate. A BNP was negative. He'll be treated for influenza with Tamiflu, Zithromax, Zofran for nausea. Provide clear instructions for followup        Dayton Bailiff, MD 12/04/11 1559

## 2011-12-04 NOTE — Progress Notes (Signed)
  Subjective:    Patient ID: [name], male    DOB: [birth year], 78 y.o.   MRN: [id number]  HPI Flu like illness- sxs started 1 week ago.  Wife had flu- was admitted to hospital.  Initially started as runny nose, then developed cough.  Started vomiting yesterday.  Was unable to eat dinner last night.  Reports vomit 'looks like phlegm'.  Difficulty tolerating fluids.  Nurse at pt's facility took temp yesterday and it was 100.  99 this AM.  Took tylenol.  + cough.   Review of Systems For ROS see HPI     Objective:   Physical Exam  Vitals reviewed. Constitutional: He appears distressed.       Pale, obviously uncomfortable  Neck: Neck supple.  Cardiovascular: Normal rate.        Difficult to hear due to rhonchi  Pulmonary/Chest: He has wheezes. He has rales.       Diffuse rhonchi Some respiratory distress despite normal RR  Lymphadenopathy:    He has no cervical adenopathy.  Neurological:       Difficulty answering questions  Skin: There is pallor.          Assessment & Plan:

## 2011-12-04 NOTE — ED Notes (Signed)
Lab called and stated bmp was hemolyzed, new order needed and new blood draw.

## 2011-12-05 ENCOUNTER — Other Ambulatory Visit: Payer: Self-pay

## 2011-12-05 ENCOUNTER — Inpatient Hospital Stay (HOSPITAL_BASED_OUTPATIENT_CLINIC_OR_DEPARTMENT_OTHER)
Admission: EM | Admit: 2011-12-05 | Discharge: 2011-12-07 | DRG: 203 | Disposition: A | Discharge status: Home / Self Care | Payer: Medicare Other | Attending: Internal Medicine | Admitting: Internal Medicine

## 2011-12-05 ENCOUNTER — Encounter (HOSPITAL_BASED_OUTPATIENT_CLINIC_OR_DEPARTMENT_OTHER): Payer: Self-pay | Admitting: Emergency Medicine

## 2011-12-05 ENCOUNTER — Emergency Department (INDEPENDENT_AMBULATORY_CARE_PROVIDER_SITE_OTHER): Payer: Medicare Other

## 2011-12-05 DIAGNOSIS — R5381 Other malaise: Secondary | ICD-10-CM

## 2011-12-05 DIAGNOSIS — N138 Other obstructive and reflux uropathy: Secondary | ICD-10-CM

## 2011-12-05 DIAGNOSIS — E860 Dehydration: Secondary | ICD-10-CM | POA: Diagnosis present

## 2011-12-05 DIAGNOSIS — Z87891 Personal history of nicotine dependence: Secondary | ICD-10-CM

## 2011-12-05 DIAGNOSIS — K4090 Unilateral inguinal hernia, without obstruction or gangrene, not specified as recurrent: Secondary | ICD-10-CM

## 2011-12-05 DIAGNOSIS — M810 Age-related osteoporosis without current pathological fracture: Secondary | ICD-10-CM

## 2011-12-05 DIAGNOSIS — R002 Palpitations: Secondary | ICD-10-CM

## 2011-12-05 DIAGNOSIS — E785 Hyperlipidemia, unspecified: Secondary | ICD-10-CM | POA: Diagnosis present

## 2011-12-05 DIAGNOSIS — R0989 Other specified symptoms and signs involving the circulatory and respiratory systems: Secondary | ICD-10-CM

## 2011-12-05 DIAGNOSIS — J329 Chronic sinusitis, unspecified: Secondary | ICD-10-CM | POA: Diagnosis present

## 2011-12-05 DIAGNOSIS — I2510 Atherosclerotic heart disease of native coronary artery without angina pectoris: Secondary | ICD-10-CM | POA: Diagnosis present

## 2011-12-05 DIAGNOSIS — R0609 Other forms of dyspnea: Secondary | ICD-10-CM

## 2011-12-05 DIAGNOSIS — E782 Mixed hyperlipidemia: Secondary | ICD-10-CM

## 2011-12-05 DIAGNOSIS — N401 Enlarged prostate with lower urinary tract symptoms: Secondary | ICD-10-CM

## 2011-12-05 DIAGNOSIS — K219 Gastro-esophageal reflux disease without esophagitis: Secondary | ICD-10-CM | POA: Diagnosis present

## 2011-12-05 DIAGNOSIS — Z79899 Other long term (current) drug therapy: Secondary | ICD-10-CM

## 2011-12-05 DIAGNOSIS — J111 Influenza due to unidentified influenza virus with other respiratory manifestations: Secondary | ICD-10-CM | POA: Diagnosis present

## 2011-12-05 DIAGNOSIS — Z880 Allergy status to penicillin: Secondary | ICD-10-CM

## 2011-12-05 DIAGNOSIS — N400 Enlarged prostate without lower urinary tract symptoms: Secondary | ICD-10-CM | POA: Diagnosis present

## 2011-12-05 DIAGNOSIS — R112 Nausea with vomiting, unspecified: Secondary | ICD-10-CM | POA: Diagnosis present

## 2011-12-05 DIAGNOSIS — Z951 Presence of aortocoronary bypass graft: Secondary | ICD-10-CM

## 2011-12-05 DIAGNOSIS — I4949 Other premature depolarization: Secondary | ICD-10-CM

## 2011-12-05 DIAGNOSIS — K2970 Gastritis, unspecified, without bleeding: Secondary | ICD-10-CM

## 2011-12-05 DIAGNOSIS — S72009A Fracture of unspecified part of neck of unspecified femur, initial encounter for closed fracture: Secondary | ICD-10-CM

## 2011-12-05 DIAGNOSIS — M2550 Pain in unspecified joint: Secondary | ICD-10-CM

## 2011-12-05 DIAGNOSIS — J209 Acute bronchitis, unspecified: Principal | ICD-10-CM | POA: Diagnosis present

## 2011-12-05 DIAGNOSIS — Z7982 Long term (current) use of aspirin: Secondary | ICD-10-CM

## 2011-12-05 DIAGNOSIS — I4891 Unspecified atrial fibrillation: Secondary | ICD-10-CM | POA: Diagnosis present

## 2011-12-05 DIAGNOSIS — S22009A Unspecified fracture of unspecified thoracic vertebra, initial encounter for closed fracture: Secondary | ICD-10-CM

## 2011-12-05 DIAGNOSIS — E876 Hypokalemia: Secondary | ICD-10-CM

## 2011-12-05 DIAGNOSIS — IMO0002 Reserved for concepts with insufficient information to code with codable children: Secondary | ICD-10-CM

## 2011-12-05 DIAGNOSIS — N429 Disorder of prostate, unspecified: Secondary | ICD-10-CM

## 2011-12-05 DIAGNOSIS — D649 Anemia, unspecified: Secondary | ICD-10-CM

## 2011-12-05 DIAGNOSIS — K589 Irritable bowel syndrome without diarrhea: Secondary | ICD-10-CM

## 2011-12-05 LAB — COMPREHENSIVE METABOLIC PANEL
AST: 22 U/L (ref 0–37)
BUN: 20 mg/dL (ref 6–23)
CO2: 22 mEq/L (ref 19–32)
Calcium: 9.5 mg/dL (ref 8.4–10.5)
Creatinine, Ser: 0.8 mg/dL (ref 0.50–1.35)
GFR calc Af Amer: >90 mL/min (ref >90)
GFR calc non Af Amer: 83 mL/min — ABNORMAL LOW (ref >90)
Glucose, Bld: 132 mg/dL — ABNORMAL HIGH (ref 70–99)
Total Bilirubin: 0.7 mg/dL (ref 0.3–1.2)

## 2011-12-05 LAB — CBC
HCT: 41.6 % (ref 39.0–52.0)
MCH: 31.1 pg (ref 26.0–34.0)
MCV: 91.2 fL (ref 78.0–100.0)
Platelets: 172 10*3/uL (ref 150–400)
RBC: 4.56 MIL/uL (ref 4.22–5.81)

## 2011-12-05 LAB — LIPASE, BLOOD: Lipase: 21 U/L (ref 11–59)

## 2011-12-05 LAB — CARDIAC PANEL(CRET KIN+CKTOT+MB+TROPI)
CK, MB: 3.6 ng/mL (ref 0.3–4.0)
Total CK: 171 U/L (ref 7–232)
Troponin I: <0.3 ng/mL (ref <0.30)

## 2011-12-05 LAB — TROPONIN I: Troponin I: <0.3 ng/mL (ref <0.30)

## 2011-12-05 LAB — INFLUENZA PANEL BY PCR (TYPE A & B): Influenza A By PCR: NEGATIVE

## 2011-12-05 MED ORDER — ASPIRIN EC 325 MG PO TBEC
325.0000 mg | DELAYED_RELEASE_TABLET | Freq: Every day | ORAL | Status: DC
  Administered 2011-12-05 – 2011-12-07 (×3): 325 mg via ORAL
  Filled 2011-12-05 (×3): qty 1

## 2011-12-05 MED ORDER — OSELTAMIVIR PHOSPHATE 75 MG PO CAPS: 75.0000 mg | ORAL_CAPSULE | Freq: Two times a day (BID) | ORAL | Status: DC

## 2011-12-05 MED ORDER — FOLIC ACID 1 MG PO TABS
1.0000 mg | ORAL_TABLET | Freq: Every day | ORAL | Status: DC
Start: 2011-12-05 — End: 2011-12-07
  Administered 2011-12-05 – 2011-12-07 (×3): 1 mg via ORAL
  Filled 2011-12-05 (×3): qty 1

## 2011-12-05 MED ORDER — VITAMIN D3 25 MCG (1000 UT) PO CAPS: 1000.0000 [IU] | ORAL_CAPSULE | ORAL | Status: DC

## 2011-12-05 MED ORDER — METOPROLOL TARTRATE 25 MG PO TABS
25.0000 mg | ORAL_TABLET | Freq: Two times a day (BID) | ORAL | Status: DC
  Administered 2011-12-05 – 2011-12-07 (×5): 25 mg via ORAL
  Filled 2011-12-05 (×6): qty 1

## 2011-12-05 MED ORDER — POTASSIUM CHLORIDE 10 MEQ/100ML IV SOLN
10.0000 meq | Freq: Once | INTRAVENOUS | Status: AC
  Administered 2011-12-05: 10 meq via INTRAVENOUS
  Filled 2011-12-05: qty 100

## 2011-12-05 MED ORDER — PANTOPRAZOLE SODIUM 40 MG IV SOLR
40.0000 mg | Freq: Once | INTRAVENOUS | Status: AC
  Administered 2011-12-05: 40 mg via INTRAVENOUS
  Filled 2011-12-05: qty 40

## 2011-12-05 MED ORDER — PANTOPRAZOLE SODIUM 40 MG IV SOLR
40.0000 mg | INTRAVENOUS | Status: DC
  Administered 2011-12-05 – 2011-12-06 (×2): 40 mg via INTRAVENOUS
  Filled 2011-12-05 (×3): qty 40

## 2011-12-05 MED ORDER — SODIUM CHLORIDE 0.9 % IV SOLN
Freq: Once | INTRAVENOUS | Status: AC
  Administered 2011-12-05: 02:00:00 via INTRAVENOUS

## 2011-12-05 MED ORDER — ROSUVASTATIN CALCIUM 10 MG PO TABS
10.0000 mg | ORAL_TABLET | Freq: Every day | ORAL | Status: DC
  Administered 2011-12-05 – 2011-12-07 (×3): 10 mg via ORAL
  Filled 2011-12-05 (×3): qty 1

## 2011-12-05 MED ORDER — OSELTAMIVIR PHOSPHATE 75 MG PO CAPS
75.0000 mg | ORAL_CAPSULE | Freq: Two times a day (BID) | ORAL | Status: DC
  Administered 2011-12-05 – 2011-12-07 (×5): 75 mg via ORAL
  Filled 2011-12-05 (×6): qty 1

## 2011-12-05 MED ORDER — POTASSIUM CHLORIDE IN NACL 20-0.9 MEQ/L-% IV SOLN
INTRAVENOUS | Status: DC
  Administered 2011-12-05 – 2011-12-06 (×3): via INTRAVENOUS
  Filled 2011-12-05 (×3): qty 1000

## 2011-12-05 MED ORDER — BENZONATATE 100 MG PO CAPS
200.0000 mg | ORAL_CAPSULE | Freq: Two times a day (BID) | ORAL | Status: DC
  Administered 2011-12-05 – 2011-12-07 (×5): 200 mg via ORAL
  Filled 2011-12-05 (×6): qty 2

## 2011-12-05 MED ORDER — GUAIFENESIN ER 600 MG PO TB12
600.0000 mg | ORAL_TABLET | Freq: Two times a day (BID) | ORAL | Status: DC
  Administered 2011-12-05 – 2011-12-06 (×3): 600 mg via ORAL
  Filled 2011-12-05 (×4): qty 1

## 2011-12-05 MED ORDER — VITAMIN D3 25 MCG (1000 UNIT) PO TABS
1000.0000 [IU] | ORAL_TABLET | Freq: Every day | ORAL | Status: DC
  Administered 2011-12-05 – 2011-12-07 (×3): 1000 [IU] via ORAL
  Filled 2011-12-05 (×3): qty 1

## 2011-12-05 MED ORDER — SODIUM CHLORIDE 0.9 % IV BOLUS (SEPSIS)
1000.0000 mL | Freq: Once | INTRAVENOUS | Status: AC
  Administered 2011-12-05: 1000 mL via INTRAVENOUS

## 2011-12-05 MED ORDER — ONDANSETRON HCL 4 MG/2ML IJ SOLN
4.0000 mg | Freq: Once | INTRAMUSCULAR | Status: AC
  Administered 2011-12-05: 4 mg via INTRAVENOUS
  Filled 2011-12-05: qty 2

## 2011-12-05 MED ORDER — SODIUM CHLORIDE 0.9 % IV SOLN: INTRAVENOUS | Status: DC

## 2011-12-05 NOTE — ED Provider Notes (Signed)
History     CSN: [id number]  Arrival date & time 12/05/11  0006   First MD Initiated Contact with Patient 12/05/11 0015      Chief Complaint  Patient presents with  . Emesis     Patient is a 78 y.o. male presenting with vomiting. The history is provided by the patient.  Emesis    patient reports approximately 24 hours of nausea and vomiting without diarrhea.  He reports sore throat cough and congestion.  His had myalgias and chills without documented fever.  He seen the emergency department earlier today and was evaluated with labs and a chest x-ray which were without significant abnormality.  He was given fluids and antibiotics and it is reported that he was able to tolerate oral fluids.  He was discharged home.  He reports been unable to keep anything down since returning to the care facility.  He has a history of coronary artery disease status post CABG.  He also has a history of esophageal reflux and IBS.  Nothing worsens the symptoms.  Nothing improves his symptoms.  He continues to deny diarrhea melena and hematochezia.  He reports nonbloody and nonbilious vomiting.  He denies abdominal pain.  He denies dysuria and urinary frequency.  He denies chest pain palpitations.  He denies shortness of breath or other anginal equivalents.  Past Medical History  Diagnosis Date  . History of heart bypass surgery   . Mixed hyperlipidemia   . UNSPECIFIED ANEMIA   . CAD   . Atrial fibrillation   . PREMATURE VENTRICULAR CONTRACTIONS   . Esophageal reflux   . IBS   . HYPERPLASIA, PROSTATE NOS W/URINARY OBST/LUTS   . LUMBAR RADICULOPATHY, RIGHT   . OSTEOPOROSIS   . Palpitations   . INGUINAL HERNIA   . Lichen planus 2006    Francesca Oman MD  . Diverticulosis     Diverticulitis 2004    Past Surgical History  Procedure Date  . Hip fracture surgery     Dr.Dahldorff  . Knee surgery     Right  . Hip fracture surgery 2009  . Inguinal hernia repair 2012    Dr Luisa Hart    Family History    Problem Relation Age of Onset  . Breast cancer Sister   . Stroke Mother   . Heart disease Father   . Heart disease Sister 41    History  Substance Use Topics  . Smoking status: Former Smoker    Quit date: 11/27/1962  . Smokeless tobacco: Not on file  . Alcohol Use: Yes     2 oz wine/ night      Review of Systems  Gastrointestinal: Positive for vomiting.  All other systems reviewed and are negative.    Allergies  Amoxicillin  Home Medications   Current Outpatient Rx  Name Route Sig Dispense Refill  . ASPIRIN 325 MG PO TABS Oral Take 325 mg by mouth daily.      . AZITHROMYCIN 250 MG PO TABS Oral Take 1 tablet (250 mg total) by mouth daily. Take first 2 tablets together, then 1 every day until finished. 6 tablet 0  . TUMS PO  as needed.      [name] VITAMIN D3 1000 UNITS PO CAPS Oral Take 1,000 Units by mouth.      . FOLIC ACID 1 MG PO TABS Oral Take 1 tablet (1 mg total) by mouth daily. 30 tablet 12  . METOPROLOL TARTRATE 25 MG PO TABS Oral Take 1 tablet (25  mg total) by mouth 2 (two) times daily. 60 tablet 11  . ONE-DAILY MULTI VITAMINS PO TABS Oral Take 1 tablet by mouth daily.      [name] ONDANSETRON 8 MG PO TBDP Oral Take 1 tablet (8 mg total) by mouth every 8 (eight) hours as needed for nausea. 20 tablet 0  . OSELTAMIVIR PHOSPHATE 75 MG PO CAPS Oral Take 1 capsule (75 mg total) by mouth every 12 (twelve) hours. 10 capsule 0  . CALCIUM POLYCARBOPHIL 625 MG PO TABS Oral Take 625 mg by mouth daily.      [name] RANITIDINE HCL 150 MG PO TABS Oral Take 150 mg by mouth as needed.     [name] ROSUVASTATIN CALCIUM 10 MG PO TABS Oral Take 1 tablet (10 mg total) by mouth daily. 30 tablet 6    BP 135/68  Pulse 72  Temp(Src) 97.9 F (36.6 C) (Oral)  Resp 20  SpO2 98%  Physical Exam  Nursing note and vitals reviewed. Constitutional: He is oriented to person, place, and time. He appears well-developed and well-nourished.  HENT:  Head: Normocephalic and atraumatic.  Eyes: EOM are normal.   Neck: Normal range of motion.  Cardiovascular: Normal rate, regular rhythm, normal heart sounds and intact distal pulses.   Pulmonary/Chest: Effort normal and breath sounds normal. No respiratory distress.  Abdominal: Soft. He exhibits no distension. There is no tenderness.  Genitourinary: Rectum normal. Guaiac negative stool.  Musculoskeletal: Normal range of motion.  Neurological: He is alert and oriented to person, place, and time.  Skin: Skin is warm and dry.  Psychiatric: He has a normal mood and affect. Judgment normal.    ED Course  Procedures (including critical care time)   Date: 12/05/2011  Rate: 76  Rhythm: normal sinus rhythm  QRS Axis: normal  Intervals: incomplete RBBB  ST/T Wave abnormalities: normal  Conduction Disutrbances:none  Narrative Interpretation:   Old EKG Reviewed: No significant changes noted     Labs Reviewed  CBC - Abnormal; Notable for the following:    WBC 10.7 (*)    All other components within normal limits  COMPREHENSIVE METABOLIC PANEL - Abnormal; Notable for the following:    Potassium 3.3 (*)    Glucose, Bld 132 (*)    GFR calc non Af Amer 83 (*)    All other components within normal limits  LIPASE, BLOOD  TROPONIN I  POCT OCCULT BLOOD STOOL, DEVICE  OCCULT BLOOD X 1 CARD TO LAB, STOOL   Dg Chest 2 View  12/04/2011  *RADIOLOGY REPORT*  Clinical Data: Nausea.  Fever.  Prior CABG.  CHEST - 2 VIEW  Comparison: 04/05/2011  Findings: Scarring in both upper lobes has a nodular components in the left upper lobe, but is not significantly changed from 2010 and is likely benign.  Underlying emphysema noted.  Prior CABG noted along with a stable mid-thoracic compression fracture.  No edema is evident.  IMPRESSION:  1.  Chronic bilateral scattered scarring, favoring the upper lobes, similar to prior exams.  No acute findings. 2.  Stable mid-thoracic compression fracture. 3.  Emphysema.  Original Report Authenticated By: Dellia Cloud, M.D.      1. Nausea and vomiting   2. Gastritis   3. Hypokalemia       MDM  Patient is well-appearing.  He was started on Tamiflu earlier this evening.  At this time I'll repeat labs and give the patient fluids and antiemetics.  I probably gave the patient in the emergency department for  several hours at which point then we can try an oral trial.  At this point his abdomen is completely benign.  I will obtain plain films.  At this point I do not think he needs a CT scan of his abdomen suspicion for bowel obstruction is low  4:02 AM  the patient repo vomiting in the emergency department.  Additional antibiotics given.  Given that this is the second visit and he continues to vomit home the patient for additional observation the hospital.  Mr. lumbar the patient is a CT scan of his abdomen as repeat abdominal exams of and benign.  IV proton ex given.  He may represent a gastritis.  He has no free air or evidence of small bowel obstruction on x-ray.  His Hemoccult exam is negative.  4:33 AM Spoke with Dr Kaylyn Layer, [facility] who accepts the patient in transfer  Lyanne Co, MD 12/05/11 [phone]

## 2011-12-05 NOTE — ED Notes (Signed)
Here for same earlier today.  Continues to vomit

## 2011-12-05 NOTE — H&P (Signed)
PCP:   Marga Melnick, MD, MD   Chief Complaint:  Cough, vomiting  HPI: 78 year old male who came to [place] chief complaint of nausea vomiting without diarrhea going on for 2 days. Patient says that she has been sick over the past few days with symptoms mainly with cough nasal congestion, sore throat, headache. He also had been vomiting after the bouts of cough. He also admits coughing up clear and yellow-colored phlegm. He denies any chest pain but does have intermittent shortness of breath. Patient is not sure about noticing blood in the vomitus. He also admits to having abdominal pain which is mainly in the epigastric region, started after vomiting. Patient was admitted to [facility] later was discharged on Tamiflu and Zithromax patient could not stay home and she continued to have vomiting so he went back to medicine to [facility] from there he was transferred to [facility] for admission and further workup  Allergies:   Allergies  Allergen Reactions  . Amoxicillin     REACTION: n/v/d      Past Medical History  Diagnosis Date  . History of heart bypass surgery   . Mixed hyperlipidemia   . UNSPECIFIED ANEMIA   . CAD   . Atrial fibrillation   . PREMATURE VENTRICULAR CONTRACTIONS   . Esophageal reflux   . IBS   . HYPERPLASIA, PROSTATE NOS W/URINARY OBST/LUTS   . LUMBAR RADICULOPATHY, RIGHT   . OSTEOPOROSIS   . Palpitations   . INGUINAL HERNIA   . Lichen planus 2006    Francesca Oman MD  . Diverticulosis     Diverticulitis 2004    Past Surgical History  Procedure Date  . Hip fracture surgery     Dr.Dahldorff  . Knee surgery     Right  . Hip fracture surgery 2009  . Inguinal hernia repair 2012    Dr Luisa Hart    Prior to Admission medications   Medication Sig Start Date End Date Taking? Authorizing Provider  aspirin 325 MG tablet Take 325 mg by mouth daily.      Historical Provider, MD  azithromycin (ZITHROMAX) 250 MG tablet Take 1 tablet (250 mg total) by  mouth daily. Take first 2 tablets together, then 1 every day until finished. 12/04/11 12/09/11  Dayton Bailiff, MD  Calcium Carbonate Antacid (TUMS PO) as needed.      Historical Provider, MD  Cholecalciferol (VITAMIN D3) 1000 UNITS CAPS Take 1,000 Units by mouth.      Historical Provider, MD  folic acid (FOLVITE) 1 MG tablet Take 1 tablet (1 mg total) by mouth daily. 04/26/11   Wendall Stade, MD  metoprolol tartrate (LOPRESSOR) 25 MG tablet Take 1 tablet (25 mg total) by mouth 2 (two) times daily. 04/26/11 04/25/12  Wendall Stade, MD  Multiple Vitamin (MULTIVITAMIN) tablet Take 1 tablet by mouth daily.      Historical Provider, MD  ondansetron (ZOFRAN ODT) 8 MG disintegrating tablet Take 1 tablet (8 mg total) by mouth every 8 (eight) hours as needed for nausea. 12/04/11 12/11/11  Dayton Bailiff, MD  oseltamivir (TAMIFLU) 75 MG capsule Take 1 capsule (75 mg total) by mouth every 12 (twelve) hours. 12/04/11 12/14/11  Dayton Bailiff, MD  polycarbophil (FIBERCON) 625 MG tablet Take 625 mg by mouth daily.      Historical Provider, MD  ranitidine (ZANTAC) 150 MG tablet Take 150 mg by mouth as needed.     Historical Provider, MD  rosuvastatin (CRESTOR) 10 MG tablet Take  1 tablet (10 mg total) by mouth daily. 06/30/11   Wendall Stade, MD    Social History:  reports that he quit smoking about 49 years ago. He does not have any smokeless tobacco history on file. He reports that he drinks alcohol. He reports that he does not use illicit drugs.  Family History  Problem Relation Age of Onset  . Breast cancer Sister   . Stroke Mother   . Heart disease Father   . Heart disease Sister 56    Review of Systems:  Constitutional:Admits to low grade fever,  No chills, diaphoresis HEENT: Denies photophobia, eye pain, redness, hearing loss, ear pain, Positive congestion, sore throat, rhinorrhea. Respiratory:Positive  SOB,  No DOE, cough, chest tightness,  and wheezing.   Cardiovascular: Denies chest pain, palpitations and leg  swelling.  Gastrointestinal: See HPI  Genitourinary: Denies dysuria, urgency, frequency, hematuria, flank pain and difficulty urinating.  Neurological: Denies dizziness, seizures, syncope, weakness, light-headedness, numbness and headaches.     Physical Exam: Blood pressure 137/67, pulse 89, temperature 98.2 F (36.8 C), temperature source Oral, resp. rate 16, SpO2 100.00%. Constitutional: Vital signs reviewed.  Patient is a well-developed and well-nourished male in no acute distress and cooperative with exam. Alert and oriented x3.  Head: Normocephalic and atraumatic Eyes: PERRL, EOMI, conjunctivae normal, No scleral icterus.  Neck: Supple, Trachea midline normal ROM, No JVD, mass, thyromegaly, or carotid bruit present.  Cardiovascular: RRR, S1 normal, S2 normal, no MRG, pulses symmetric and intact bilaterally Pulmonary/Chest: CTAB, no wheezes, rales, or rhonchi Abdominal: Soft. Non-tender, non-distended, bowel sounds are normal, no masses, organomegaly, or guarding present. .  Neurological: A&O x3, Strenght is normal and symmetric bilaterally, cranial nerve II-XII are grossly intact, no focal motor deficit. Psychiatric: Normal mood and affect. speech and behavior is normal. Judgment and thought content normal. Cognition and memory are normal.    Labs on Admission:  Results for orders placed during the hospital encounter of 12/05/11 (from the past 48 hour(s))  CBC     Status: Abnormal   Collection Time   12/05/11 12:38 AM      Component Value Range Comment   WBC 10.7 (*) 4.0 - 10.5 (K/uL)    RBC 4.56  4.22 - 5.81 (MIL/uL)    Hemoglobin 14.2  13.0 - 17.0 (g/dL)    HCT 45.4  09.8 - 11.9 (%)    MCV 91.2  78.0 - 100.0 (fL)    MCH 31.1  26.0 - 34.0 (pg)    MCHC 34.1  30.0 - 36.0 (g/dL)    RDW 14.7  82.9 - 56.2 (%)    Platelets 172  150 - 400 (K/uL)   COMPREHENSIVE METABOLIC PANEL     Status: Abnormal   Collection Time   12/05/11 12:38 AM      Component Value Range Comment   Sodium 139   135 - 145 (mEq/L)    Potassium 3.3 (*) 3.5 - 5.1 (mEq/L)    Chloride 98  96 - 112 (mEq/L)    CO2 22  19 - 32 (mEq/L)    Glucose, Bld 132 (*) 70 - 99 (mg/dL)    BUN 20  6 - 23 (mg/dL)    Creatinine, Ser 1.30  0.50 - 1.35 (mg/dL)    Calcium 9.5  8.4 - 10.5 (mg/dL)    Total Protein 7.4  6.0 - 8.3 (g/dL)    Albumin 3.9  3.5 - 5.2 (g/dL)    AST 22  0 - 37 (U/L)  ALT 12  0 - 53 (U/L)    Alkaline Phosphatase 49  39 - 117 (U/L)    Total Bilirubin 0.7  0.3 - 1.2 (mg/dL)    GFR calc non Af Amer 83 (*) >90 (mL/min)    GFR calc Af Amer >90  >90 (mL/min)   LIPASE, BLOOD     Status: Normal   Collection Time   12/05/11 12:38 AM      Component Value Range Comment   Lipase 21  11 - 59 (U/L)   TROPONIN I     Status: Normal   Collection Time   12/05/11 12:38 AM      Component Value Range Comment   Troponin I <0.30  <0.30 (ng/mL)   OCCULT BLOOD X 1 CARD TO LAB, STOOL     Status: Normal   Collection Time   12/05/11  4:19 AM      Component Value Range Comment   Fecal Occult Bld NEGATIVE       Radiological Exams on Admission: Dg Chest 2 View  12/04/2011  *RADIOLOGY REPORT*  Clinical Data: Nausea.  Fever.  Prior CABG.  CHEST - 2 VIEW  Comparison: 04/05/2011  Findings: Scarring in both upper lobes has a nodular components in the left upper lobe, but is not significantly changed from 2010 and is likely benign.  Underlying emphysema noted.  Prior CABG noted along with a stable mid-thoracic compression fracture.  No edema is evident.  IMPRESSION:  1.  Chronic bilateral scattered scarring, favoring the upper lobes, similar to prior exams.  No acute findings. 2.  Stable mid-thoracic compression fracture. 3.  Emphysema.  Original Report Authenticated By: Dellia Cloud, M.D.   Dg Abd 2 Views  12/05/2011  *RADIOLOGY REPORT*  Clinical Data: Nausea, vomiting and congestion.  Flu like symptoms.  ABDOMEN - 2 VIEW  Comparison: 02/26/2010.  Findings: Partial visualization of the chest shows CABG. Emphysema.  No  free air underneath the hemidiaphragms.  Bowel gas pattern appears within normal limits.  No dilated large or small bowel.  Partial visualization of the right proximal femur ORIF. Lumbar spondylosis and scoliosis.  Multilevel lumbar compression fractures are chronic. Per CMS PQRS reporting requirements (PQRS Measure 24): Given the patient's age of greater than 50 and the fracture site (hip, distal radius, or spine), the patient should be tested for osteoporosis using DXA, and the appropriate treatment considered based on the DXA results.  IMPRESSION: Nonobstructive bowel gas pattern.  No acute abnormality.  Original Report Authenticated By: Andreas Newport, M.D.    Assessment/Plan  Acute Bronchitis: ? Viral Has been afebrile, WBC mildly elevated.  Will obtain Influenza PCR Continue Tamiflu Will also start patient on Tessalon Perles, guaifenesin. Do not think that patient has bacterial infection at this time so will refrain from starting antibiotics.  Atrial Fibrillation Patient has been on aspirin and metoprolol for  stroke prevention and rate control respectively. I'm going to change the aspirin to enteric-coated aspirin 325 mg by mouth daily. And will continue him on metoprolol.  CAD Patient will continued on metoprolol.,  aspirin, statin. Patient's first set of cardiac enzymes is negative We'll obtain 3 sets of cardiac enzymes to rule out ischemia. We'll also obtain EKG.  GERD We'll start the patient on IV Protonix 40 mg daily  Hypokalemia Replace potassium Check BMP in the morning  DVT prophylaxis SCDs  Time Spent on Admission: 50 min  LAMA,GAGAN S [facility] Pager: [phone] 12/05/2011, 9:07 AM

## 2011-12-06 DIAGNOSIS — R112 Nausea with vomiting, unspecified: Secondary | ICD-10-CM | POA: Diagnosis present

## 2011-12-06 LAB — COMPREHENSIVE METABOLIC PANEL
ALT: 10 U/L (ref 0–53)
Alkaline Phosphatase: 43 U/L (ref 39–117)
BUN: 15 mg/dL (ref 6–23)
CO2: 21 mEq/L (ref 19–32)
Chloride: 105 mEq/L (ref 96–112)
GFR calc Af Amer: >90 mL/min (ref >90)
GFR calc non Af Amer: 89 mL/min — ABNORMAL LOW (ref >90)
Glucose, Bld: 79 mg/dL (ref 70–99)
Potassium: 4.6 mEq/L (ref 3.5–5.1)
Sodium: 139 mEq/L (ref 135–145)
Total Bilirubin: 0.5 mg/dL (ref 0.3–1.2)
Total Protein: 6.4 g/dL (ref 6.0–8.3)

## 2011-12-06 LAB — CBC
HCT: 39.8 % (ref 39.0–52.0)
MCHC: 33.2 g/dL (ref 30.0–36.0)
Platelets: 183 10*3/uL (ref 150–400)
RDW: 15.3 % (ref 11.5–15.5)
WBC: 11.8 10*3/uL — ABNORMAL HIGH (ref 4.0–10.5)

## 2011-12-06 LAB — CARDIAC PANEL(CRET KIN+CKTOT+MB+TROPI): CK, MB: 3.5 ng/mL (ref 0.3–4.0)

## 2011-12-06 MED ORDER — CALCIUM CARBONATE ANTACID 500 MG PO CHEW
1.0000 | CHEWABLE_TABLET | Freq: Two times a day (BID) | ORAL | Status: DC
  Administered 2011-12-06 – 2011-12-07 (×2): 200 mg via ORAL
  Filled 2011-12-06 (×4): qty 1

## 2011-12-06 MED ORDER — METHYLPREDNISOLONE SODIUM SUCC 40 MG IJ SOLR
40.0000 mg | INTRAMUSCULAR | Status: DC
  Administered 2011-12-06: 40 mg via INTRAVENOUS
  Filled 2011-12-06 (×2): qty 1

## 2011-12-06 MED ORDER — FLUTICASONE PROPIONATE 50 MCG/ACT NA SUSP
1.0000 | Freq: Every day | NASAL | Status: DC
  Administered 2011-12-06: 1 via NASAL
  Filled 2011-12-06: qty 16

## 2011-12-06 MED ORDER — ONDANSETRON HCL 4 MG/2ML IJ SOLN
4.0000 mg | Freq: Four times a day (QID) | INTRAMUSCULAR | Status: DC | PRN
  Administered 2011-12-06 – 2011-12-07 (×2): 4 mg via INTRAVENOUS
  Filled 2011-12-06 (×3): qty 2

## 2011-12-06 MED ORDER — MENTHOL 3 MG MT LOZG
1.0000 | LOZENGE | OROMUCOSAL | Status: DC | PRN
  Filled 2011-12-06: qty 9

## 2011-12-06 MED ORDER — ONDANSETRON 8 MG PO TBDP
8.0000 mg | ORAL_TABLET | Freq: Three times a day (TID) | ORAL | Status: DC | PRN
  Filled 2011-12-06: qty 1

## 2011-12-06 NOTE — Progress Notes (Signed)
Subjective: Patient continues to have upper respiratory secretions the cough, swallows the sputum and then he has nausea vomiting. Denies any abdominal pain  Physical Exam: Blood pressure 155/73, pulse 47, temperature 98.7 F (37.1 C), temperature source Oral, resp. rate 20, weight 71.1 kg (156 lb 12 oz), SpO2 97.00%. Alert and oriented x3  Chest with anterior rhonchi no crackles no wheezes  Heart regular rate and rhythm without murmurs rubs or gallops  Abdomen is soft nontender nondistended bowel sounds are present   Investigations:  Recent Results (from the past 240 hour(s))  MRSA PCR SCREENING     Status: Normal   Collection Time   12/05/11  8:21 AM      Component Value Range Status Comment   MRSA by PCR NEGATIVE  NEGATIVE  Final      Basic Metabolic Panel:  Basename 12/06/11 0910 12/05/11 0038  NA 139 139  K 4.6 3.3*  CL 105 98  CO2 21 22  GLUCOSE 79 132*  BUN 15 20  CREATININE 0.67 0.80  CALCIUM 8.8 9.5  MG -- --  PHOS -- --   Liver Function Tests:  [facility] 12/06/11 0910 12/05/11 0038  AST 20 22  ALT 10 12  ALKPHOS 43 49  BILITOT 0.5 0.7  PROT 6.4 7.4  ALBUMIN 3.1* 3.9     CBC:  Basename 12/06/11 0910 12/05/11 0038 12/04/11 1429  WBC 11.8* 10.7* --  NEUTROABS -- -- 8.5*  HGB 13.2 14.2 --  HCT 39.8 41.6 --  MCV 94.3 91.2 --  PLT 183 172 --    Dg Abd 2 Views  12/05/2011  *RADIOLOGY REPORT*  Clinical Data: Nausea, vomiting and congestion.  Flu like symptoms.  ABDOMEN - 2 VIEW  Comparison: 02/26/2010.  Findings: Partial visualization of the chest shows CABG. Emphysema.  No free air underneath the hemidiaphragms.  Bowel gas pattern appears within normal limits.  No dilated large or small bowel.  Partial visualization of the right proximal femur ORIF. Lumbar spondylosis and scoliosis.  Multilevel lumbar compression fractures are chronic. Per CMS PQRS reporting requirements (PQRS Measure 24): Given the patient's age of greater than 50 and the fracture site  (hip, distal radius, or spine), the patient should be tested for osteoporosis using DXA, and the appropriate treatment considered based on the DXA results.  IMPRESSION: Nonobstructive bowel gas pattern.  No acute abnormality.  Original Report Authenticated By: Andreas Newport, M.D.      Medications:  Scheduled:    . aspirin EC  325 mg Oral Daily  . benzonatate  200 mg Oral BID  . calcium carbonate  1 tablet Oral BID WC  . cholecalciferol  1,000 Units Oral Daily  . fluticasone  1 spray Each Nare Daily  . folic acid  1 mg Oral Daily  . methylPREDNISolone (SOLU-MEDROL) injection  40 mg Intravenous Q24H  . metoprolol tartrate  25 mg Oral BID  . oseltamivir  75 mg Oral BID  . pantoprazole (PROTONIX) IV  40 mg Intravenous Q24H  . rosuvastatin  10 mg Oral Daily  . DISCONTD: sodium chloride   Intravenous STAT  . DISCONTD: guaiFENesin  600 mg Oral BID    Impression:  Active Problems:  Nausea and vomiting  Flu syndrome  CAD  Atrial fibrillation  Esophageal reflux     Plan: I have increased to symptomatic treatment with steroids cortisone nasal spray and Cepacol lozenges. I have added an antiemetic Will see the results tomorrow      LOS: 1 day  Lonia Blood, MD Pager: [phone] 12/06/2011, 5:45 PM

## 2011-12-06 NOTE — Progress Notes (Signed)
Utilization Review Completed.  [name]  12/06/2011 

## 2011-12-07 ENCOUNTER — Ambulatory Visit: Payer: Medicare Other | Admitting: Cardiovascular Disease

## 2011-12-07 MED ORDER — FLUTICASONE PROPIONATE 50 MCG/ACT NA SUSP: 1.0000 | Freq: Every day | NASAL | Status: DC

## 2011-12-07 MED ORDER — PREDNISONE (PAK) 10 MG PO TABS: 10.0000 mg | ORAL_TABLET | ORAL | Status: AC

## 2011-12-07 MED ORDER — OMEPRAZOLE 20 MG PO CPDR: 20.0000 mg | DELAYED_RELEASE_CAPSULE | Freq: Every day | ORAL | Status: DC

## 2011-12-07 MED ORDER — ONDANSETRON 8 MG PO TBDP: 8.0000 mg | ORAL_TABLET | Freq: Three times a day (TID) | ORAL | Status: AC | PRN

## 2011-12-07 MED ORDER — MENTHOL 3 MG MT LOZG: 1.0000 | LOZENGE | OROMUCOSAL | Status: DC | PRN

## 2011-12-07 MED ORDER — BENZONATATE 200 MG PO CAPS: 200.0000 mg | ORAL_CAPSULE | Freq: Two times a day (BID) | ORAL | Status: AC

## 2011-12-07 NOTE — Progress Notes (Signed)
Subjective: Patient continues to have upper respiratory secretions the cough, swallows the sputum and then he has nausea vomiting. Denies any abdominal pain  Physical Exam: Blood pressure 151/74, pulse 60, temperature 97.3 F (36.3 C), temperature source Oral, resp. rate 20, weight 71 kg (156 lb 8.4 oz), SpO2 95.00%.  Patient Vitals for the past 24 hrs:  BP Temp Temp src Pulse Resp SpO2 Weight  12/07/11 0614 151/74 mmHg 97.3 F (36.3 C) Oral 60  20  95 % 71 kg (156 lb 8.4 oz)  12/07/11 0325 143/71 mmHg 97.3 F (36.3 C) Oral 51  18  95 % -  12/06/11 2100 137/78 mmHg 98.2 F (36.8 C) Oral 54  20  96 % -  12/06/11 1848 164/84 mmHg 97.7 F (36.5 C) Oral 60  20  95 % -  12/06/11 1429 155/73 mmHg 98.7 F (37.1 C) Oral 47  20  97 % -  12/06/11 1100 126/62 mmHg 97.6 F (36.4 C) - 58  20  96 % -    Investigations:  Recent Results (from the past 240 hour(s))  MRSA PCR SCREENING     Status: Normal   Collection Time   12/05/11  8:21 AM      Component Value Range Status Comment   MRSA by PCR NEGATIVE  NEGATIVE  Final      Basic Metabolic Panel:  Basename 12/06/11 0910 12/05/11 0038  NA 139 139  K 4.6 3.3*  CL 105 98  CO2 21 22  GLUCOSE 79 132*  BUN 15 20  CREATININE 0.67 0.80  CALCIUM 8.8 9.5  MG -- --  PHOS -- --   Liver Function Tests:  [facility] 12/06/11 0910 12/05/11 0038  AST 20 22  ALT 10 12  ALKPHOS 43 49  BILITOT 0.5 0.7  PROT 6.4 7.4  ALBUMIN 3.1* 3.9     CBC:  Basename 12/06/11 0910 12/05/11 0038 12/04/11 1429  WBC 11.8* 10.7* --  NEUTROABS -- -- 8.5*  HGB 13.2 14.2 --  HCT 39.8 41.6 --  MCV 94.3 91.2 --  PLT 183 172 --    No results found.    Medications:  Scheduled:    . aspirin EC  325 mg Oral Daily  . benzonatate  200 mg Oral BID  . calcium carbonate  1 tablet Oral BID WC  . cholecalciferol  1,000 Units Oral Daily  . fluticasone  1 spray Each Nare Daily  . folic acid  1 mg Oral Daily  . methylPREDNISolone (SOLU-MEDROL) injection  40 mg  Intravenous Q24H  . metoprolol tartrate  25 mg Oral BID  . oseltamivir  75 mg Oral BID  . pantoprazole (PROTONIX) IV  40 mg Intravenous Q24H  . rosuvastatin  10 mg Oral Daily  . DISCONTD: guaiFENesin  600 mg Oral BID    Impression:  Active Problems:  Nausea and vomiting  Flu syndrome  CAD  Atrial fibrillation  Esophageal reflux     Plan:  Dc home    LOS: 2 days   Dolton Shaker, MD Pager: [phone] 12/07/2011, 7:57 AM

## 2011-12-07 NOTE — Progress Notes (Signed)
Pt discharged to home via wheelchair with driver from independent living facility. Discharge instructions given and pt. Verbalized understanding. Discussed medication, follow up appointments and self care instructions. No further questions at this time.

## 2011-12-08 NOTE — Discharge Summary (Signed)
Patient ID: [name] MRN: [id number] DOB/AGE: 1930/03/05 78 y.o. Primary Care Physician:William Alwyn Ren, MD, MD Admit date: 12/05/2011 Discharge date: 12/08/2011    Discharge Diagnoses:   Active Problems:  Nausea and vomiting  Flu syndrome  CAD  Atrial fibrillation  Esophageal reflux   Medication List  As of 12/08/2011  4:12 PM   START taking these medications         benzonatate 200 MG capsule   Commonly known as: TESSALON   Take 1 capsule (200 mg total) by mouth 2 (two) times daily.      fluticasone 50 MCG/ACT nasal spray   Commonly known as: FLONASE   Place 1 spray into the nose daily.      menthol-cetylpyridinium 3 MG lozenge   Commonly known as: CEPACOL   Take 1 lozenge (3 mg total) by mouth as needed.      omeprazole 20 MG capsule   Commonly known as: PRILOSEC   Take 1 capsule (20 mg total) by mouth daily.      predniSONE 10 MG tablet   Commonly known as: STERAPRED UNI-PAK   Take 1 tablet (10 mg total) by mouth See admin instructions. 3/day for 1 day then 2/ day for 1 day then 1/day for 1 day then stop         CONTINUE taking these medications         aspirin 325 MG tablet      cholecalciferol 1000 UNITS tablet   Commonly known as: VITAMIN D      FIBER PO      folic acid 1 MG tablet   Commonly known as: FOLVITE   Take 1 tablet (1 mg total) by mouth daily.      metoprolol tartrate 25 MG tablet   Commonly known as: LOPRESSOR   Take 1 tablet (25 mg total) by mouth 2 (two) times daily.      mulitivitamin with minerals Tabs      ondansetron 8 MG disintegrating tablet   Commonly known as: ZOFRAN-ODT   Take 1 tablet (8 mg total) by mouth every 8 (eight) hours as needed for nausea.      rosuvastatin 10 MG tablet   Commonly known as: CRESTOR   Take 1 tablet (10 mg total) by mouth daily.      TUMS PO          Where to get your medications    These are the prescriptions that you need to pick up.   You may get these medications from any pharmacy.           benzonatate 200 MG capsule   fluticasone 50 MCG/ACT nasal spray   menthol-cetylpyridinium 3 MG lozenge   omeprazole 20 MG capsule   ondansetron 8 MG disintegrating tablet   predniSONE 10 MG tablet            Discharged Condition:good    Consults:none  Significant Diagnostic Studies: Dg Chest 2 View  12/04/2011  *RADIOLOGY REPORT*  Clinical Data: Nausea.  Fever.  Prior CABG.  CHEST - 2 VIEW  Comparison: 04/05/2011  Findings: Scarring in both upper lobes has a nodular components in the left upper lobe, but is not significantly changed from 2010 and is likely benign.  Underlying emphysema noted.  Prior CABG noted along with a stable mid-thoracic compression fracture.  No edema is evident.  IMPRESSION:  1.  Chronic bilateral scattered scarring, favoring the upper lobes, similar to prior exams.  No acute findings. 2.  Stable mid-thoracic compression fracture. 3.  Emphysema.  Original Report Authenticated By: Dellia Cloud, M.D.   Dg Abd 2 Views  12/05/2011  *RADIOLOGY REPORT*  Clinical Data: Nausea, vomiting and congestion.  Flu like symptoms.  ABDOMEN - 2 VIEW  Comparison: 02/26/2010.  Findings: Partial visualization of the chest shows CABG. Emphysema.  No free air underneath the hemidiaphragms.  Bowel gas pattern appears within normal limits.  No dilated large or small bowel.  Partial visualization of the right proximal femur ORIF. Lumbar spondylosis and scoliosis.  Multilevel lumbar compression fractures are chronic. Per CMS PQRS reporting requirements (PQRS Measure 24): Given the patient's age of greater than 50 and the fracture site (hip, distal radius, or spine), the patient should be tested for osteoporosis using DXA, and the appropriate treatment considered based on the DXA results.  IMPRESSION: Nonobstructive bowel gas pattern.  No acute abnormality.  Original Report Authenticated By: Andreas Newport, M.D.    Lab Results: No results found for this or any previous visit  (from the past 48 hour(s)). Recent Results (from the past 240 hour(s))  MRSA PCR SCREENING     Status: Normal   Collection Time   12/05/11  8:21 AM      Component Value Range Status Comment   MRSA by PCR NEGATIVE  NEGATIVE  Final      Hospital Course:  78 year old man presented to the emergency room with the sinusitis, pharyngitis and bronchitis with resultant abundant mucoid secretions that he was swallowing making himself nauseated and vomiting them back. This process lead to dehydration and weakness and required admission to the hospital. Patient received intravenous fluids and symptomatic treatment with Flonase, low dose prednisone, Protonix. He started improving and was discharged home to followup with his family care physician.  Discharge Exam: Blood pressure 144/76, pulse 62, temperature 98.6 F (37 C), temperature source Oral, resp. rate 18, weight 71 kg (156 lb 8.4 oz), SpO2 97.00%. Alert and oriented x3  Cvs: RRR RS: CTAB   Disposition: home Time > 30 min  Discharge Orders    Future Appointments: Provider: Department: Dept Phone: Center:   12/18/2011 11:00 AM Pecola Lawless, MD [facility] [phone] [facility]     Future Orders Please Complete By Expires   Diet general      Increase activity slowly         Follow-up Information    Follow up with Marga Melnick, MD in 1 week.         Signed: Theodora Lalanne 12/08/2011, 4:12 PM

## 2011-12-18 ENCOUNTER — Ambulatory Visit (INDEPENDENT_AMBULATORY_CARE_PROVIDER_SITE_OTHER): Payer: Medicare Other | Admitting: Internal Medicine

## 2011-12-18 ENCOUNTER — Encounter: Payer: Self-pay | Admitting: Internal Medicine

## 2011-12-18 DIAGNOSIS — M8580 Other specified disorders of bone density and structure, unspecified site: Secondary | ICD-10-CM

## 2011-12-18 DIAGNOSIS — E876 Hypokalemia: Secondary | ICD-10-CM

## 2011-12-18 DIAGNOSIS — R5383 Other fatigue: Secondary | ICD-10-CM

## 2011-12-18 DIAGNOSIS — M899 Disorder of bone, unspecified: Secondary | ICD-10-CM

## 2011-12-18 LAB — TSH: TSH: 1.96 u[IU]/mL (ref 0.35–5.50)

## 2011-12-18 LAB — CBC WITH DIFFERENTIAL/PLATELET
Basophils Absolute: 0 10*3/uL (ref 0.0–0.1)
HCT: 41.5 % (ref 39.0–52.0)
Lymphs Abs: 2.5 10*3/uL (ref 0.7–4.0)
MCV: 95.7 fl (ref 78.0–100.0)
Monocytes Absolute: 0.9 10*3/uL (ref 0.1–1.0)
Neutrophils Relative %: 64.6 % (ref 43.0–77.0)
Platelets: 178 10*3/uL (ref 150.0–400.0)
RDW: 15.5 % — ABNORMAL HIGH (ref 11.5–14.6)

## 2011-12-18 LAB — BASIC METABOLIC PANEL
BUN: 28 mg/dL — ABNORMAL HIGH (ref 6–23)
Chloride: 104 mEq/L (ref 96–112)
Creatinine, Ser: 0.9 mg/dL (ref 0.4–1.5)
GFR: 82.24 mL/min (ref >60.00)
Glucose, Bld: 89 mg/dL (ref 70–99)
Potassium: 5.4 mEq/L — ABNORMAL HIGH (ref 3.5–5.1)

## 2011-12-18 NOTE — Progress Notes (Signed)
  Subjective:    Patient ID: [name], male    DOB: [birth year], 78 y.o.   MRN: [id number]  HPI He was hospitalized 1/7-12/06/2011 with acute bronchitis. The presentation was cough with nausea and vomiting. Hospital course was complicated by hypokalemia; potassium was as low as 3.3. He had mild hyperglycemia with IV fluids.  He was discharged on prednisone, but he stopped this after 2 days because he felt it caused nausea.  Osteopenia was noted on abdominal films. He is not had a bone density for several years.  Review of Systems Since discharge he denies nausea, vomiting, fever, chills,  Sweats, or purulent secretions. He does have residual fatigue.     Objective:   Physical Exam Gen.: Thin but healthy and well-nourished in appearance. Alert, appropriate and cooperative throughout exam. Eyes: No corneal or conjunctival inflammation noted.  Neck: No deformities, masses, or tenderness noted.  Thyroid normal. Lungs: Normal respiratory effort; chest expands symmetrically. Lungs are clear to auscultation without rales, wheezes, or increased work of breathing. Heart: Slow rate and lightly irregular  rhythm. Split S1 ;normal S2. No gallop, click, or rub. No murmur. Abdomen: Bowel sounds normal; abdomen soft and nontender. No masses, organomegaly or hernias noted.                                                                                  Musculoskeletal/extremities:  No clubbing, cyanosis,or  edema noted.  Nail health  good. Vascular: Carotid, radial artery, dorsalis pedis and  posterior tibial pulses are  equal. No bruits present. Neurologic: Alert and oriented x3. Deep tendon reflexes symmetrical but 1/2 + @ knees.          Skin: Intact without suspicious lesions or rashes. No jaundice or tenting Lymph: No cervical, axillary, or inguinal lymphadenopathy present. Psych: Mood and affect are normal. Normally interactive                                                                                          Assessment & Plan:   #1 bronchitis, acute, resolved  #2 nausea and vomiting resolved  #3 residual fatigue  #4 hypokalemia while hospitalized  Plan: See orders and recommendations.

## 2011-12-18 NOTE — Patient Instructions (Signed)
The Bone density  referral will be scheduled and you'll be notified of the time.

## 2011-12-25 ENCOUNTER — Ambulatory Visit (INDEPENDENT_AMBULATORY_CARE_PROVIDER_SITE_OTHER): Payer: Medicare Other | Admitting: Cardiovascular Disease

## 2011-12-25 ENCOUNTER — Encounter: Payer: Self-pay | Admitting: Cardiovascular Disease

## 2011-12-25 DIAGNOSIS — I2510 Atherosclerotic heart disease of native coronary artery without angina pectoris: Secondary | ICD-10-CM

## 2011-12-25 DIAGNOSIS — E782 Mixed hyperlipidemia: Secondary | ICD-10-CM

## 2011-12-25 DIAGNOSIS — I4891 Unspecified atrial fibrillation: Secondary | ICD-10-CM

## 2011-12-25 DIAGNOSIS — J111 Influenza due to unidentified influenza virus with other respiratory manifestations: Secondary | ICD-10-CM

## 2011-12-25 NOTE — Patient Instructions (Signed)
Your physician wants you to follow-up in:  6 MONTHS WITH DR NISHAN  You will receive a reminder letter in the mail two months in advance. If you don't receive a letter, please call our office to schedule the follow-up appointment. Your physician recommends that you continue on your current medications as directed. Please refer to the Current Medication list given to you today. 

## 2011-12-25 NOTE — Progress Notes (Signed)
Patient ID: [name], male   DOB: 1930/11/14, 78 y.o.   MRN: [id number] [name] is seen today post CABG. 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 Post op course was complicated by afib that was hard to control. He was sent home on Dig, BB and Multaq He converted and has maintained NSR. F/U colonoscopy was fine. Had uncomplicated left hernia surgery with Dr Davina Poke this year. No arrhythmia or SSCP. Compliant with meds. Will be moving with wife to assisted living soon. Recent 3 day hospitalization for dehydration and flu  Improved  ROS: Denies fever, malais, weight loss, blurry vision, decreased visual acuity, cough, sputum, SOB, hemoptysis, pleuritic pain, palpitaitons, heartburn, abdominal pain, melena, lower extremity edema, claudication, or rash.  All other systems reviewed and negative  General: Affect appropriate Healthy:  appears stated age HEENT: normal Neck supple with no adenopathy JVP normal no bruits no thyromegaly Lungs clear with no wheezing and good diaphragmatic motion Heart:  S1/S2 no murmur, no rub, gallop or click PMI normal Abdomen: benighn, BS positve, no tenderness, no AAA no bruit.  No HSM or HJR Distal pulses intact with no bruits No edema Neuro non-focal Skin warm and dry No muscular weakness   Current Outpatient Prescriptions  Medication Sig Dispense Refill  . aspirin 325 MG tablet Take 325 mg by mouth daily.        . Calcium Carbonate Antacid (TUMS PO) Take 1 tablet by mouth 2 (two) times daily as needed. For calcium       . cholecalciferol (VITAMIN D) 1000 UNITS tablet Take 1,000 Units by mouth every morning.        [name] FIBER PO Take 2 capsules by mouth every morning.        . fluticasone (FLONASE) 50 MCG/ACT nasal spray Place 1 spray into the nose daily.  16 g  0  . folic acid (FOLVITE) 1 MG tablet Take 1 tablet (1 mg total) by mouth daily.  30 tablet  12  . metoprolol tartrate (LOPRESSOR) 25 MG tablet Take 1 tablet (25 mg total) by  mouth 2 (two) times daily.  60 tablet  11  . Multiple Vitamin (MULITIVITAMIN WITH MINERALS) TABS Take 1 tablet by mouth daily.        . rosuvastatin (CRESTOR) 10 MG tablet Take 1 tablet (10 mg total) by mouth daily.  30 tablet  6    Allergies  Amoxicillin  Electrocardiogram:  Assessment and Plan

## 2011-12-25 NOTE — Assessment & Plan Note (Signed)
Maint NSR  Continue beta blocker

## 2011-12-25 NOTE — Assessment & Plan Note (Signed)
Resolved Encouraged him to seek medical care earlier so Tamiflu can be initiated

## 2011-12-25 NOTE — Assessment & Plan Note (Signed)
Stable with no angina and good activity level.  Continue medical Rx  

## 2011-12-25 NOTE — Assessment & Plan Note (Signed)
Cholesterol is at goal.  Continue current dose of statin and diet Rx.  No myalgias or side effects.  F/U  LFT's in 6 months. Lab Results  Component Value Date   LDLCALC 46 05/20/2010             

## 2012-02-07 ENCOUNTER — Other Ambulatory Visit: Payer: Self-pay

## 2012-02-07 MED ORDER — ROSUVASTATIN CALCIUM 10 MG PO TABS: 10.0000 mg | ORAL_TABLET | Freq: Every day | ORAL | Status: DC

## 2012-04-27 ENCOUNTER — Other Ambulatory Visit: Payer: Self-pay | Admitting: *Deleted

## 2012-04-27 MED ORDER — METOPROLOL TARTRATE 25 MG PO TABS: 25.0000 mg | ORAL_TABLET | Freq: Two times a day (BID) | ORAL | Status: DC

## 2012-05-27 ENCOUNTER — Other Ambulatory Visit: Payer: Self-pay | Admitting: *Deleted

## 2012-05-27 MED ORDER — FOLIC ACID 1 MG PO TABS: 1.0000 mg | ORAL_TABLET | Freq: Every day | ORAL | Status: DC

## 2012-06-20 ENCOUNTER — Encounter: Payer: Self-pay | Admitting: Internal Medicine

## 2012-06-20 ENCOUNTER — Ambulatory Visit (INDEPENDENT_AMBULATORY_CARE_PROVIDER_SITE_OTHER): Payer: Medicare Other | Admitting: Internal Medicine

## 2012-06-20 VITALS — BP 104/60 | HR 44 | Wt 163.0 lb

## 2012-06-20 DIAGNOSIS — L309 Dermatitis, unspecified: Secondary | ICD-10-CM

## 2012-06-20 DIAGNOSIS — M542 Cervicalgia: Secondary | ICD-10-CM

## 2012-06-20 DIAGNOSIS — L259 Unspecified contact dermatitis, unspecified cause: Secondary | ICD-10-CM

## 2012-06-20 NOTE — Patient Instructions (Addendum)
Use an anti-inflammatory cream such as Aspercreme or Zostrix cream twice a day to the neck as needed. In lieu of this warm moist compresses or  hot water bottle can be used. Do not apply ice . Consider glucosamine sulfate 1500 mg daily for joint symptoms. Take this daily  for 3 months and then leave it off for 2 months. This will rehydrate the cartilages.

## 2012-06-20 NOTE — Progress Notes (Signed)
  Subjective:    Patient ID: [name], male    DOB: 04-Feb-1930, 78 y.o.   MRN: [id number]  HPI For the last 2-3 months he has noted a "crick" in his neck with variable severity of pain. It will last 30-60 minutes; it is nonradiating. It was not related to trauma or repetitive motion. It has not limited his range of motion. He has not used any medications for this. It can awaken him at night    Review of Systems Numbness/tingling: no Weakness: no Fever:  no Headache:  no Bowel/bladder dysfunction:  no  He has a roughened area of skin over the left maxillary area which has not been responsive to Aveeno       Objective:   Physical Exam He is thin but well-nourished in appearance. He is in no distress  There is no lymphadenopathy about the neck or axilla  Range of motion of the neck is normal without pain.  Deep tendon reflexes 0- 1/2 + .Tone and strength are normal in the upper extremities with no evidence of cervical nerve root impingement.  Mixed arthritic changes are present in  hands  He has benign keratoses of the posterior thorax. There is a slightly erythematous, rough eczematoid patch over the left maxilla           Assessment & Plan:   #1 cervical osteoarthritis with intermittent pain; there is no evidence of cervical radiculopathy  #2 dermatitis left maxilla; dermatology assessment appropriate

## 2012-06-25 ENCOUNTER — Ambulatory Visit (INDEPENDENT_AMBULATORY_CARE_PROVIDER_SITE_OTHER): Payer: Medicare Other | Admitting: Cardiovascular Disease

## 2012-06-25 ENCOUNTER — Encounter: Payer: Self-pay | Admitting: Cardiovascular Disease

## 2012-06-25 VITALS — BP 114/58 | HR 55 | Ht 75.0 in | Wt 162.0 lb

## 2012-06-25 DIAGNOSIS — E782 Mixed hyperlipidemia: Secondary | ICD-10-CM

## 2012-06-25 DIAGNOSIS — I4891 Unspecified atrial fibrillation: Secondary | ICD-10-CM

## 2012-06-25 DIAGNOSIS — I2510 Atherosclerotic heart disease of native coronary artery without angina pectoris: Secondary | ICD-10-CM

## 2012-06-25 NOTE — Progress Notes (Signed)
Patient ID: [name], male   DOB: 01-02-1930, 78 y.o.   MRN: [id number] Burlon is seen today post CABG. 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 Post op course was complicated by afib that was hard to control. He was sent home on Dig, BB and Multaq He converted and has maintained NSR. F/U colonoscopy was fine. Had uncomplicated left hernia surgery with Dr Davina Poke this year. No arrhythmia or SSCP. Compliant with meds. Enjoying living at assisted living out by [employer] and [employer] health still poor   ROS: Denies fever, malais, weight loss, blurry vision, decreased visual acuity, cough, sputum, SOB, hemoptysis, pleuritic pain, palpitaitons, heartburn, abdominal pain, melena, lower extremity edema, claudication, or rash.  All other systems reviewed and negative  General: Affect appropriate Healthy:  appears stated age HEENT: normal Neck supple with no adenopathy JVP normal no bruits no thyromegaly Lungs clear with no wheezing and good diaphragmatic motion Heart:  S1/S2 no murmur, no rub, gallop or click PMI normal Abdomen: benighn, BS positve, no tenderness, no AAA no bruit.  No HSM or HJR Distal pulses intact with no bruits No edema Neuro non-focal Skin warm and dry No muscular weakness   Current Outpatient Prescriptions  Medication Sig Dispense Refill  . aspirin 325 MG tablet Take 325 mg by mouth daily.        . Calcium Carbonate Antacid (TUMS PO) Take 1 tablet by mouth 2 (two) times daily as needed. For calcium       . cholecalciferol (VITAMIN D) 1000 UNITS tablet Take 1,000 Units by mouth every morning.        [name] FIBER PO Take 2 capsules by mouth every morning.        . folic acid (FOLVITE) 1 MG tablet Take 1 tablet (1 mg total) by mouth daily.  30 tablet  12  . metoprolol tartrate (LOPRESSOR) 25 MG tablet Take 1 tablet (25 mg total) by mouth 2 (two) times daily.  180 tablet  3  . Multiple Vitamin (MULITIVITAMIN WITH MINERALS) TABS Take 1  tablet by mouth daily.        . rosuvastatin (CRESTOR) 10 MG tablet Take 1 tablet (10 mg total) by mouth daily.  30 tablet  6  . DISCONTD: metoprolol tartrate (LOPRESSOR) 25 MG tablet Take 1 tablet (25 mg total) by mouth 2 (two) times daily.  60 tablet  11    Allergies  Amoxicillin  Electrocardiogram:  Assessment and Plan

## 2012-06-25 NOTE — Assessment & Plan Note (Signed)
Cholesterol is at goal.  Continue current dose of statin and diet Rx.  No myalgias or side effects.  F/U  LFT's in 6 months. Lab Results  Component Value Date   LDLCALC 46 05/20/2010             

## 2012-06-25 NOTE — Assessment & Plan Note (Signed)
Stable with no angina and good activity level.  Continue medical Rx  

## 2012-06-25 NOTE — Assessment & Plan Note (Signed)
Maint NSR no palpitations.  Multaq D/C

## 2012-06-25 NOTE — Patient Instructions (Signed)
Your physician wants you to follow-up in: YEAR WITH DR NISHAN  You will receive a reminder letter in the mail two months in advance. If you don't receive a letter, please call our office to schedule the follow-up appointment.  Your physician recommends that you continue on your current medications as directed. Please refer to the Current Medication list given to you today. 

## 2012-07-30 ENCOUNTER — Telehealth: Payer: Self-pay | Admitting: Internal Medicine

## 2012-07-30 NOTE — Telephone Encounter (Signed)
I spoke with patient and he stated Dr.Hopper rx'ed this for him 2-3 years ago. Patient stated he thought he had indigestion at the time but was having heart-related concerns.  Patient stated he is with the same symptoms now (Heart Flutters), I informed patient to be seen in the emergency room . Patient stated he will contact his Cardiologist first and if they are unable to help him he will go to the ER.   Message to be sent to Dr.Hopper as a Lorain Childes

## 2012-07-30 NOTE — Telephone Encounter (Signed)
Refill: Rantidine 150mg  tablet. Take 1 tablet twice a day before meals. Qty 60. Last fill 11-22-10

## 2012-07-31 ENCOUNTER — Encounter: Payer: Self-pay | Admitting: Internal Medicine

## 2012-07-31 ENCOUNTER — Other Ambulatory Visit: Payer: Self-pay | Admitting: Internal Medicine

## 2012-07-31 ENCOUNTER — Ambulatory Visit (INDEPENDENT_AMBULATORY_CARE_PROVIDER_SITE_OTHER): Payer: Medicare Other | Admitting: Internal Medicine

## 2012-07-31 ENCOUNTER — Telehealth: Payer: Self-pay | Admitting: Cardiovascular Disease

## 2012-07-31 VITALS — BP 118/70 | HR 55 | Temp 98.0°F | Wt 159.2 lb

## 2012-07-31 DIAGNOSIS — R079 Chest pain, unspecified: Secondary | ICD-10-CM

## 2012-07-31 DIAGNOSIS — K219 Gastro-esophageal reflux disease without esophagitis: Secondary | ICD-10-CM

## 2012-07-31 DIAGNOSIS — I2510 Atherosclerotic heart disease of native coronary artery without angina pectoris: Secondary | ICD-10-CM

## 2012-07-31 DIAGNOSIS — I499 Cardiac arrhythmia, unspecified: Secondary | ICD-10-CM

## 2012-07-31 MED ORDER — RANITIDINE HCL 150 MG PO TABS: 150.0000 mg | ORAL_TABLET | Freq: Two times a day (BID) | ORAL | Status: DC

## 2012-07-31 MED ORDER — NITROGLYCERIN 0.4 MG SL SUBL: 0.4000 mg | SUBLINGUAL_TABLET | SUBLINGUAL | Status: DC | PRN

## 2012-07-31 NOTE — Telephone Encounter (Signed)
Spoke with staff at [facility] who state [name] has an irregular heart beat--no chest pain,slight dizziness,and difficulty moving around--advised Eric Duhe has been in a-fib for awhile and since he has no CP advised they call PCP to treat pt--staff agrees

## 2012-07-31 NOTE — Progress Notes (Signed)
  Subjective:    Patient ID: [name], male    DOB: 1930/06/06, 78 y.o.   MRN: [id number]  HPI He has had intermittent pounding in his chest associated with intermittent pressure in the left sternal border since 07/27/12. Both are short lived and nonexertional; he feels he has more symptoms when supine. The last episode of the pounding was 2-3 days ago. The symptoms began after he saw his cardiologist 06/25/12. That note was reviewed  He's been under a great deal of stress lately. His wife has dementia, diabetes and coronary disease. Her care needs have been demanding.Additionally they are selling their house as they've moved into retirement facility.  The symptoms are similar to those he had prior to his diagnosis of coronary disease several years ago.  Additionally has had exertional chest pain when he walks approximately 100 yards to see his wife who is residing different building since she fell 2 weeks ago.    Review of Systems He had called to get ranitidine refill;he wanted it if he were having "gas". He is not having dysphagia, hoarseness, abdominal pain,significant weight loss,abdominal pain, melena or  rectal bleeding.     Objective:   Physical Exam Gen.: Thin but adequately nourished in appearance. Alert, appropriate and cooperative throughout exam. Eyes: No corneal or conjunctival inflammation noted. No icterus Mouth: Oral mucosa and oropharynx reveal no lesions or exudates. Teeth in good repair. Neck: No deformities, masses, or tenderness noted. Thyroid normal. Lungs: Normal respiratory effort; chest expands symmetrically. Lungs are clear to auscultation without rales, wheezes, or increased work of breathing. Heart: Normal rate and rhythm. Loud split S1 . No gallop, click, or rub. No murmur. Abdomen: Bowel sounds normal; abdomen soft and nontender. No masses, organomegaly or hernias noted.                                                                                  Musculoskeletal/extremities: No deformity or scoliosis noted of  the thoracic or lumbar spine. No clubbing, cyanosis, edema, or deformity noted. Joints normal. Nail health good.Homan's negative Vascular: Carotid, radial artery, dorsalis pedis and  posterior tibial pulses are full and equal. No bruits present. Neurologic: Alert and oriented x3. Skin: Intact without suspicious lesions or rashes. Lymph: No cervical, axillary, or inguinal lymphadenopathy present. Psych: Mood and affect are normal. Normally interactive                                                                                         Assessment & Plan:  #1 "pounding" when supine; ? Hiatal hernia # 2 exertional chest pain. EKG reveals sinus bradycardia @ 47 with a rare supraventricular ectopic beat. No ischemic changes present  Plan: Nitroglycerin as needed for pain will be prescribed. Ranitidine and anti-reflux regimen as trial for #1

## 2012-07-31 NOTE — Patient Instructions (Addendum)
The triggers for dyspepsia or "heart burn"  include stress; the "aspirin family" ; alcohol; peppermint; and caffeine (coffee, tea, cola, and chocolate). The aspirin family would include aspirin and the nonsteroidal agents such as ibuprofen &  Naproxen. Tylenol would not cause reflux. If having dyspepsia ; food & drink should be avoided for @ least 2 hours before going to bed.  

## 2012-07-31 NOTE — Telephone Encounter (Signed)
Pt has irregular heartbeat, denies chest pain, SOB on excertion, dizziness in the early am none now, nursing home requesting appt today, pls call

## 2012-07-31 NOTE — Telephone Encounter (Signed)
This will be discussed at his office visit today 9/4. I do not want to renew this and then possibly have to substitute another agent for this.

## 2012-08-15 ENCOUNTER — Encounter: Payer: Self-pay | Admitting: Internal Medicine

## 2012-08-15 ENCOUNTER — Ambulatory Visit (INDEPENDENT_AMBULATORY_CARE_PROVIDER_SITE_OTHER): Payer: Medicare Other | Admitting: Internal Medicine

## 2012-08-15 VITALS — BP 108/62 | HR 43 | Temp 97.8°F | Wt 160.0 lb

## 2012-08-15 DIAGNOSIS — J209 Acute bronchitis, unspecified: Secondary | ICD-10-CM

## 2012-08-15 MED ORDER — AZITHROMYCIN 250 MG PO TABS: ORAL_TABLET | ORAL | Status: DC

## 2012-08-15 MED ORDER — HYDROCODONE-HOMATROPINE 5-1.5 MG/5ML PO SYRP: 5.0000 mL | ORAL_SOLUTION | Freq: Four times a day (QID) | ORAL | Status: DC | PRN

## 2012-08-15 NOTE — Progress Notes (Signed)
  Subjective:    Patient ID: [name], male    DOB: 1930/02/04, 78 y.o.   MRN: [id number]  HPI  Symptoms began 08/14/12 as rhinitis with clear drainage. Subsequently his developed a cough with scant beige material without associated shortness of breath or wheezing.  Left over Nasonex  & OTC med for rhinitis were of no benefit    Review of Systems The illness was not preceded by extrinsic symptoms of itchy, watery eyes or sneezing. He's had no associated frontal headache, facial pain, sore throat or nasal purulence. He also denies fever, chills, or sweats     Objective:   Physical Exam General appearance:good health ;well nourished; no acute distress or increased work of breathing is present.  No  lymphadenopathy about the head, neck, or axilla noted.   Eyes: No conjunctival inflammation or lid edema is present.   Ears:  External ear exam shows no significant lesions or deformities.  Otoscopic examination reveals clear canals, tympanic membranes are intact bilaterally without bulging, retraction, inflammation or discharge.  Nose:  External nasal examination shows no deformity or inflammation. Nasal mucosa are pink and moist without lesions or exudates. No septal dislocation or deviation.No obstruction to airflow.   Oral exam: Dental hygiene is good; lips and gums are healthy appearing.There is no oropharyngeal erythema or exudate noted. Slightly hoarse    Heart:  Slow (P 54 on recheck) rate and regular rhythm. S1 and S2 normal without gallop, murmur, click, Rare premature  beat  Lungs:Chest clear to auscultation; no wheezes, rhonchi,rales ,or rubs present.No increased work of breathing.    Extremities:  No cyanosis, edema, or clubbing  noted    Skin: Warm & dry          Assessment & Plan:  #1 acute bronchitis w/o bronchospasm Plan: See orders and recommendations

## 2012-08-15 NOTE — Patient Instructions (Addendum)
Plain Mucinex for thick secretions ;force NON dairy fluids . Use a Neti pot daily as needed for sinus congestion; going from open side to congested side . Nasal cleansing in the shower as discussed. Make sure that all residual soap is removed to prevent irritation. Nasonex 1 spray in each nostril twice a day as needed. Use the "crossover" technique as discussed. Plain Allegra 160 daily as needed for itchy eyes & sneezing.    

## 2012-09-10 ENCOUNTER — Telehealth: Payer: Self-pay | Admitting: Cardiovascular Disease

## 2012-09-10 MED ORDER — ROSUVASTATIN CALCIUM 10 MG PO TABS: 10.0000 mg | ORAL_TABLET | Freq: Every day | ORAL | Status: DC

## 2012-09-10 NOTE — Telephone Encounter (Signed)
Per pt [facility] requested refill with no response, needs crestor asap

## 2012-09-12 ENCOUNTER — Telehealth: Payer: Self-pay | Admitting: Cardiovascular Disease

## 2012-09-12 MED ORDER — ROSUVASTATIN CALCIUM 10 MG PO TABS: 10.0000 mg | ORAL_TABLET | Freq: Every day | ORAL | Status: DC

## 2012-09-12 NOTE — Telephone Encounter (Signed)
rx requested to be called into [facility] for crestor but it was called to [facility] instead, they were given the info and will fill rx but needs pharmacy changed in the computer

## 2012-10-03 ENCOUNTER — Ambulatory Visit (INDEPENDENT_AMBULATORY_CARE_PROVIDER_SITE_OTHER): Payer: Medicare Other | Admitting: Internal Medicine

## 2012-10-03 ENCOUNTER — Encounter: Payer: Self-pay | Admitting: Internal Medicine

## 2012-10-03 VITALS — BP 104/66 | HR 47 | Temp 97.6°F | Wt 162.8 lb

## 2012-10-03 DIAGNOSIS — H8110 Benign paroxysmal vertigo, unspecified ear: Secondary | ICD-10-CM

## 2012-10-03 MED ORDER — MECLIZINE HCL 25 MG PO TABS: ORAL_TABLET | ORAL | Status: DC

## 2012-10-03 NOTE — Progress Notes (Signed)
  Subjective:    Patient ID: [name], male    DOB: Aug 25, 1930, 78 y.o.   MRN: [id number]  HPI Dizziness Onset:last Wednesday 10/30.Pt experienced dysquilibrium on 10/30 when getting out of bed. He also experienced dysequlibrium in September. At that time, he had upper respiratory infection symtoms (cough, runny nose, and water eyes). His cough and water eyes resolved but still have rhinorhea- clear drainage in the morning.  Context:sitting up from sleeping position Position change:yes Benign positional vertigo symptoms: yes Duration: Worse in morning but gets better as the day progress Treatment/response:none     Review of Systems Cardiac prodrome: no Neurologic prodrome:no  headache, numbness , weakness, change in coordination (gait/falling)- tingling in right neck X 1 only Syncope:no Seizure activity:no Visual change (blurred/double/loss):none Hearing loss/tinnitus:none Nausea/sweating: nausea on 1st day Chest pain:no Dyspnea:no         Objective:   Physical Exam  Gen. appearance: Well-nourished, in no distress Eyes: Extraocular motion intact, field of vision normal, vision grossly intact. He was placed supine while his head was rotated first to the right and then to the left with the second maneuver. He kept his eyes focused straightforward during these maneuvers. When he rose from exam table with the head rotated to the left; he exhibited nonsustained nystagmus as his head was turned back to midline. ENT: Canals clear, tympanic membranes normal, tuning fork exam normal, hearing grossly decreased Neck: Normal range of motion, no masses, normal thyroid Cardiovascular: Rate and rhythm normal; no murmurs, gallops or extra heart sounds Muscle skeletal: Range of motion, tone, &  strength normal Neuro:no cranial nerve deficit, deep tendon  reflexes normal with isometric hand grip, gait normal, Romberg test borderline positive,  Lymph: No cervical or axillary LA Skin: Warm and dry  without suspicious lesions or rashes Psych: no anxiety or mood change. Normally interactive and cooperative.        Assessment & Plan:  1). Benign Paroxysmal Positional Vertigo- Nystagmus with dix-hallpike maneuver.  Plan: See orders and recommendations

## 2012-10-03 NOTE — Patient Instructions (Addendum)
Nasonex 1 spray in each nostril twice a day as needed. Use the "crossover" technique as discussed

## 2012-10-18 ENCOUNTER — Ambulatory Visit (HOSPITAL_BASED_OUTPATIENT_CLINIC_OR_DEPARTMENT_OTHER)
Admission: RE | Admit: 2012-10-18 | Discharge: 2012-10-18 | Disposition: A | Discharge status: Home / Self Care | Payer: Medicare Other | Source: Ambulatory Visit | Attending: Internal Medicine | Admitting: Internal Medicine

## 2012-10-18 ENCOUNTER — Encounter: Payer: Self-pay | Admitting: Internal Medicine

## 2012-10-18 ENCOUNTER — Ambulatory Visit (INDEPENDENT_AMBULATORY_CARE_PROVIDER_SITE_OTHER): Payer: Medicare Other | Admitting: Internal Medicine

## 2012-10-18 VITALS — BP 128/66 | HR 56 | Temp 97.8°F | Resp 12 | Wt 166.8 lb

## 2012-10-18 DIAGNOSIS — M5412 Radiculopathy, cervical region: Secondary | ICD-10-CM

## 2012-10-18 DIAGNOSIS — M47812 Spondylosis without myelopathy or radiculopathy, cervical region: Secondary | ICD-10-CM | POA: Insufficient documentation

## 2012-10-18 NOTE — Progress Notes (Signed)
  Subjective:    Patient ID: [name], male    DOB: Mar 10, 1930, 78 y.o.   MRN: [id number]  HPI  He has had chronic neck pain for years; he's had an exacerbation of pain at the right posterior base of the neck for the last 2 months. This is described as intermittent and dull, lasting 1-2 minutes. It seems to be worse when he is leaning over or pushing his wife's wheelchair.  It has been associated with some numbness which radiates from the base of the right neck to the right shoulder area.  He denies weakness in his arms or loss of control of bladder or bowels.    Review of Systems He's had no fever, chills, sweats, or unexplained weight loss  He's been noted to have some bradycardia with pulse rates as low as 43. This is asymptomatic. He is on a beta blocker. He specifically denies any dizziness or near syncope     Objective:   Physical Exam He is thin but appears well-nourished; he is in no acute distress. Appears younger than his stated age  There is full range of motion of the neck.  Has no lymphadenopathy or masses in the neck or axilla.  Knee deep tendon reflexes are decreased and must be elicited by the isometric handgrip maneuver  There is no evidence of cervical nerve deficit to testing  Fingers reveal significant degenerative joint changes distally        Assessment & Plan:  #1 cervical radiculopathy C.-5, most likely related to degenerative disc disease  Plan: See orders and recommendations

## 2012-10-18 NOTE — Patient Instructions (Addendum)
Order for x-rays entered into  the computer; these will be performed at [facility]. No appointment is necessary.   Please see if there is a  taller wheelchair available to prevent excessive flexion of your neck.The osteophytes or spurs may impinge on the cervical nerve roots as cause your symptoms. Use a cervical memory foam pillow to prevent hyperextension or hyperflexion of the cervical spine.

## 2013-04-28 ENCOUNTER — Other Ambulatory Visit: Payer: Self-pay | Admitting: *Deleted

## 2013-04-28 MED ORDER — METOPROLOL TARTRATE 25 MG PO TABS: 25.0000 mg | ORAL_TABLET | Freq: Two times a day (BID) | ORAL | Status: DC

## 2013-04-28 NOTE — Telephone Encounter (Signed)
Fax Received. Refill Completed. [name] (R.M.A)   

## 2013-05-14 ENCOUNTER — Other Ambulatory Visit: Payer: Self-pay | Admitting: *Deleted

## 2013-05-14 ENCOUNTER — Telehealth: Payer: Self-pay | Admitting: *Deleted

## 2013-05-14 MED ORDER — ROSUVASTATIN CALCIUM 10 MG PO TABS: 10.0000 mg | ORAL_TABLET | Freq: Every day | ORAL | Status: DC

## 2013-05-14 NOTE — Telephone Encounter (Signed)
LMTCB Returning patient's call from refill line message about prescriptions. Metoprolol and Crestor have recently been refilled and sent to [facility].     Ashni Lonzo, cma

## 2013-06-19 ENCOUNTER — Other Ambulatory Visit: Payer: Self-pay | Admitting: *Deleted

## 2013-06-19 MED ORDER — FOLIC ACID 1 MG PO TABS: 1.0000 mg | ORAL_TABLET | Freq: Every day | ORAL | Status: DC

## 2013-07-23 ENCOUNTER — Other Ambulatory Visit: Payer: Self-pay | Admitting: *Deleted

## 2013-07-23 MED ORDER — METOPROLOL TARTRATE 25 MG PO TABS: 25.0000 mg | ORAL_TABLET | Freq: Two times a day (BID) | ORAL | Status: DC

## 2013-08-08 ENCOUNTER — Encounter: Payer: Self-pay | Admitting: Internal Medicine

## 2013-08-08 ENCOUNTER — Ambulatory Visit (INDEPENDENT_AMBULATORY_CARE_PROVIDER_SITE_OTHER): Payer: Medicare Other | Admitting: Internal Medicine

## 2013-08-08 VITALS — BP 118/70 | HR 44 | Temp 98.0°F | Wt 165.4 lb

## 2013-08-08 DIAGNOSIS — N429 Disorder of prostate, unspecified: Secondary | ICD-10-CM

## 2013-08-08 DIAGNOSIS — R3911 Hesitancy of micturition: Secondary | ICD-10-CM

## 2013-08-08 DIAGNOSIS — M2550 Pain in unspecified joint: Secondary | ICD-10-CM

## 2013-08-08 DIAGNOSIS — R350 Frequency of micturition: Secondary | ICD-10-CM

## 2013-08-08 DIAGNOSIS — R3129 Other microscopic hematuria: Secondary | ICD-10-CM

## 2013-08-08 LAB — POCT URINALYSIS DIPSTICK
Bilirubin, UA: NEGATIVE
Ketones, UA: NEGATIVE
Leukocytes, UA: NEGATIVE
Protein, UA: NEGATIVE
Spec Grav, UA: 1.01
pH, UA: 6

## 2013-08-08 LAB — PSA: PSA: 3.15 ng/mL (ref 0.10–4.00)

## 2013-08-08 LAB — SEDIMENTATION RATE: Sed Rate: 13 mm/hr (ref 0–22)

## 2013-08-08 LAB — URIC ACID: Uric Acid, Serum: 6 mg/dL (ref 4.0–7.8)

## 2013-08-08 MED ORDER — TRAMADOL HCL 50 MG PO TABS: 50.0000 mg | ORAL_TABLET | Freq: Three times a day (TID) | ORAL | Status: DC | PRN

## 2013-08-08 NOTE — Progress Notes (Signed)
Subjective:    Patient ID: [name], male    DOB: [birth year], 79 y.o.   MRN: [id number]  HPI   He's had intermittent numbness/tightness discomfort in the posterior calves up to the popliteal space, left greater than right for approximately 2 months. He denies frank claudication  He's also had pain in the first metatarsal phalangeal joints of the feet and laterally. This is described as throbbing and intermittent, up to a level VI.  Topical capsacin did not help. He has no history of gout and is not on HCTZ.  He also has some discomfort across the base of the foot below all 5 toes intermittently; this is worse with walking.  He's also had some frequency for the last one-2 months as well as some hesitancy. He describes some malodorous urine for 2 weeks. He has nocturia one-2 times per night.  He has no history of any genitourinary disease or prostate disease.    Review of Systems  He denies fever, chills, sweats  He also denies dysuria, pyuria, or hematuria     Objective:   Physical Exam Gen.: Thin but healthy and well-nourished in appearance. Alert, appropriate and cooperative throughout exam.Appears younger than stated age    Eyes: No corneal or conjunctival inflammation noted. No icterus Nose: External nasal exam reveals no deformity or inflammation. Nasal mucosa are pink and moist. No lesions or exudates noted.   Mouth: Oral mucosa and oropharynx reveal no lesions or exudates. Teeth in good repair. Neck: No deformities, masses, or tenderness noted. Range of motion good Lungs: Normal respiratory effort; chest expands symmetrically. Lungs are clear to auscultation without rales, wheezes, or increased work of breathing. Heart: Normal rate and rhythm. Normal S1 and S2. No gallop, click, or rub. S 4 w/o murmur. Abdomen: Bowel sounds normal; abdomen soft and nontender. No masses, organomegaly or hernias noted. Genitalia: Genitalia normal . Prostate is dramatically asymmetrically  enlarged without  nodularity or induration. The right lobe is 2.5-3+ enlarged the left is 1.5.                                  Musculoskeletal/extremities: No deformity or scoliosis noted of  the thoracic or lumbar spine.   No clubbing, cyanosis, edema, or significant extremity  deformity noted. Range of motion normal .Tone & strength  Normal. Joints  reveal mild  PIP changes. Chronic nail changes especially of the right foot. Prominent metatarsophalangeal joints without tenderness or erythema. No tenderness to percussion over the soles of feet. Homans sign negative bilaterally. Able to lie down & sit up w/o help. Negative SLR bilaterally Vascular: Carotid, radial artery, dorsalis pedis and  posterior tibial pulses are full and equal. No bruits present. Neurologic: Alert and oriented x3. Deep tendon reflexes symmetrical and normal.         Skin: Intact without suspicious lesions or rashes. Lymph: No cervical, axillary, or inguinal lymphadenopathy present. Psych: Mood and affect are normal. Normally interactive  Assessment & Plan:  #1 arthralgia, multiple joints. Rule out gout  #2 frequency and hesitancy in the setting of significant asymmetric prostatic hypertrophy  #3 malodorous urine  Plan: see orders and recommendations

## 2013-08-08 NOTE — Addendum Note (Signed)
Addended by: Silvio Pate D on: 08/08/2013 02:54 PM   Modules accepted: Orders

## 2013-08-08 NOTE — Patient Instructions (Addendum)
Use an anti-inflammatory cream such as Aspercreme or Zostrix cream twice a day to the affected area as needed. In lieu of this warm moist compresses or  hot water bottle can be used. Do not apply ice . 

## 2013-08-10 LAB — URINE CULTURE: Colony Count: NO GROWTH

## 2013-08-11 ENCOUNTER — Encounter: Payer: Self-pay | Admitting: Cardiovascular Disease

## 2013-08-11 ENCOUNTER — Ambulatory Visit (INDEPENDENT_AMBULATORY_CARE_PROVIDER_SITE_OTHER): Payer: Medicare Other | Admitting: Cardiovascular Disease

## 2013-08-11 VITALS — BP 100/62 | HR 53 | Ht 76.0 in | Wt 166.8 lb

## 2013-08-11 DIAGNOSIS — E782 Mixed hyperlipidemia: Secondary | ICD-10-CM

## 2013-08-11 DIAGNOSIS — I2510 Atherosclerotic heart disease of native coronary artery without angina pectoris: Secondary | ICD-10-CM

## 2013-08-11 DIAGNOSIS — I4891 Unspecified atrial fibrillation: Secondary | ICD-10-CM

## 2013-08-11 NOTE — Assessment & Plan Note (Signed)
Stable with no angina and good activity level.  Continue medical Rx  

## 2013-08-11 NOTE — Assessment & Plan Note (Signed)
Maint NSR with no palpitaiions  ASA for CAD and no anticoagulaiton

## 2013-08-11 NOTE — Progress Notes (Signed)
Patient ID: [name], male   DOB: [birth year], 79 y.o.   MRN: [id number] [name] is seen today post CABG. 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 Post op course was complicated by afib that was hard to control. He was sent home on Dig, BB and Multaq He converted and has maintained NSR. F/U colonoscopy was fine. Had uncomplicated left hernia surgery with Dr Davina Poke this year. No arrhythmia or SSCP. Compliant with meds. Enjoying living at assisted living out by [employer] and [employer] health still poor  ROS: Denies fever, malais, weight loss, blurry vision, decreased visual acuity, cough, sputum, SOB, hemoptysis, pleuritic pain, palpitaitons, heartburn, abdominal pain, melena, lower extremity edema, claudication, or rash.  All other systems reviewed and negative  General: Affect appropriate Healthy:  appears stated age HEENT: normal Neck supple with no adenopathy JVP normal no bruits no thyromegaly Lungs clear with no wheezing and good diaphragmatic motion Heart:  S1/S2 no murmur, no rub, gallop or click PMI normal Abdomen: benighn, BS positve, no tenderness, no AAA no bruit.  No HSM or HJR Distal pulses intact with no bruits No edema Neuro non-focal Skin warm and dry No muscular weakness   Current Outpatient Prescriptions  Medication Sig Dispense Refill  . aspirin 325 MG tablet Take 325 mg by mouth daily.        . Calcium Carbonate Antacid (TUMS PO) Take 1 tablet by mouth 2 (two) times daily as needed. For calcium       . cholecalciferol (VITAMIN D) 1000 UNITS tablet Take 1,000 Units by mouth every morning.        [name] FIBER PO Take 2 capsules by mouth every morning.        . folic acid (FOLVITE) 1 MG tablet Take 1 tablet (1 mg total) by mouth daily.  30 tablet  6  . meclizine (ANTIVERT) 25 MG tablet 1/2- 1 every 8 hrs prn  30 tablet  0  . metoprolol tartrate (LOPRESSOR) 25 MG tablet Take 1 tablet (25 mg total) by mouth 2 (two) times daily.  180 tablet   0  . Multiple Vitamin (MULITIVITAMIN WITH MINERALS) TABS Take 1 tablet by mouth daily.        . rosuvastatin (CRESTOR) 10 MG tablet Take 1 tablet (10 mg total) by mouth daily.  30 tablet  6  . traMADol (ULTRAM) 50 MG tablet Take 1 tablet (50 mg total) by mouth every 8 (eight) hours as needed for pain.  30 tablet  1  . nitroGLYCERIN (NITROSTAT) 0.4 MG SL tablet Place 1 tablet (0.4 mg total) under the tongue every 5 (five) minutes as needed for chest pain.  30 tablet  3  . ranitidine (ZANTAC) 150 MG tablet Take 1 tablet (150 mg total) by mouth 2 (two) times daily.  60 tablet  2   No current facility-administered medications for this visit.    Allergies  Amoxicillin  Electrocardiogram:  07/31/12  SR rate 47 ICRBBB  Assessment and Plan

## 2013-08-11 NOTE — Assessment & Plan Note (Signed)
Cholesterol is at goal.  Continue current dose of statin and diet Rx.  No myalgias or side effects.  F/U  LFT's in 6 months. Lab Results  Component Value Date   LDLCALC 46 05/20/2010             

## 2013-09-11 ENCOUNTER — Encounter: Payer: Self-pay | Admitting: Internal Medicine

## 2013-09-11 ENCOUNTER — Ambulatory Visit (INDEPENDENT_AMBULATORY_CARE_PROVIDER_SITE_OTHER): Payer: Medicare Other | Admitting: Internal Medicine

## 2013-09-11 VITALS — BP 100/57 | HR 50 | Temp 98.0°F | Wt 163.4 lb

## 2013-09-11 DIAGNOSIS — R197 Diarrhea, unspecified: Secondary | ICD-10-CM

## 2013-09-11 DIAGNOSIS — R109 Unspecified abdominal pain: Secondary | ICD-10-CM

## 2013-09-11 DIAGNOSIS — K589 Irritable bowel syndrome without diarrhea: Secondary | ICD-10-CM

## 2013-09-11 NOTE — Progress Notes (Signed)
  Subjective:    Patient ID: [name], male    DOB: 29-Jul-1930, 79 y.o.   MRN: [id number]  HPI  Symptoms began this morning at 4 AM as lower abdominal cramping pain bilaterally which lasted approximately 10 seconds and was up to a level VI. This was followed by frank diarrhea with resolution of the cramping pain.  There have been no exposure to similarly ill individuals, suspicious foods, contaminated water, travel, or sick pets. No antibiotics in recent past  He has had no recurrence of diarrhea pain since but he has had a sensation of seasickness.  He did treat himself with sublingual medicine than prescribed by the emergency room. We do not have the name on record  He did have vomiting last week without associated bowel changes  He has a history of GERD as well as irritable bowel. He has never had appendicitis or colitis  He takes a 325 mg aspirin a day and drinks up to 4 cups of coffee a week. He does not drink alcohol or eat peppermint    Review of Systems He is concerned about pending surgeries; cataract 11/10 & prostate biopsy 10/24. Also his wife has dementia & is in skilled care  As stated he is not having nausea or vomiting, at this time. He also has experienced no constipation recently. He has no melena or rectal bleeding  There is no associated dyspepsia, dysphagia, anorexia, or hematemesis  Weight is stable. He has no fever, chills, or sweats  There's been no dysuria, pyuria, or hematuria.  This is not associated with flank or back pain.     Objective:   Physical Exam  General appearance is one of good health and nourishment w/o distress. Appears younger than stated age  Eyes: No conjunctival inflammation or scleral icterus is present.  Oral exam: Dental hygiene is good; lips and gums are healthy appearing.There is no oropharyngeal erythema or exudate noted.   Heart:  Normal rate and regular rhythm. S1 and S2 normal without gallop, murmur, click, rub or other  extra sounds     Lungs:Chest clear to auscultation; no wheezes, rhonchi,rales ,or rubs present.No increased work of breathing.   Abdomen: bowel sounds decreased, soft and non-tender without masses, organomegaly or hernias noted.  No guarding or rebound even to percussion . No tenderness over the flanks to percussion  Musculoskeletal: Able to lie flat and sit up without help. Negative straight leg raising bilaterally. Gait normal  Skin:Warm & dry.  Intact without suspicious lesions or rashes ; no jaundice ; slight tenting  Lymphatic: No lymphadenopathy is noted about the head, neck, axilla areas.                Assessment & Plan:  #1 self limited abd pain & diarrhea; probable IBS aggravated by stress of pending surgeries & wife's health issues See orders

## 2013-09-11 NOTE — Patient Instructions (Signed)
Stay on clear liquids for 48-72 hours or until bowels are normal.This would include  jello, sherbert (NOT ice cream), Lipton's chicken noodle soup(NOT cream based soups),Gatorade Lite, flat Ginger ale (without High Fructose Corn Syrup),dry toast or crackers, baked potato.No milk , dairy or grease until bowels are formed. Align , a Pro Biotic , daily if stools are loose. Immodium AD for frankly watery stool. Report increasing pain, fever or rectal bleeding 

## 2013-09-12 LAB — CBC WITH DIFFERENTIAL/PLATELET
Basophils Absolute: 0 10*3/uL (ref 0.0–0.1)
Eosinophils Absolute: 0.4 10*3/uL (ref 0.0–0.7)
Hemoglobin: 14.1 g/dL (ref 13.0–17.0)
Lymphocytes Relative: 21.1 % (ref 12.0–46.0)
MCHC: 33.5 g/dL (ref 30.0–36.0)
Monocytes Relative: 8.4 % (ref 3.0–12.0)
Neutro Abs: 5 10*3/uL (ref 1.4–7.7)
Neutrophils Relative %: 64.4 % (ref 43.0–77.0)
RDW: 15.5 % — ABNORMAL HIGH (ref 11.5–14.6)

## 2013-09-19 HISTORY — PX: PROSTATE BIOPSY: SHX241

## 2013-09-30 ENCOUNTER — Other Ambulatory Visit: Payer: Self-pay | Admitting: Internal Medicine

## 2013-09-30 NOTE — Telephone Encounter (Signed)
Ranitidine refill sent to [facility]

## 2013-10-02 ENCOUNTER — Other Ambulatory Visit: Payer: Self-pay

## 2013-10-06 HISTORY — PX: CATARACT EXTRACTION: SUR2

## 2013-10-13 ENCOUNTER — Encounter: Payer: Self-pay | Admitting: Internal Medicine

## 2013-10-13 ENCOUNTER — Ambulatory Visit (INDEPENDENT_AMBULATORY_CARE_PROVIDER_SITE_OTHER): Payer: Medicare Other | Admitting: Internal Medicine

## 2013-10-13 VITALS — BP 118/57 | HR 47 | Temp 97.3°F | Wt 163.8 lb

## 2013-10-13 DIAGNOSIS — K589 Irritable bowel syndrome without diarrhea: Secondary | ICD-10-CM

## 2013-10-13 DIAGNOSIS — K5730 Diverticulosis of large intestine without perforation or abscess without bleeding: Secondary | ICD-10-CM

## 2013-10-13 NOTE — Progress Notes (Signed)
  Subjective:    Patient ID: ESGAR BARNICK, male    DOB: 1930-03-20, 79 y.o.   MRN: [id number]  HPI  He is here to followup recent GI symptoms. He was last seen 09/11/13 with abdominal discomfort and bowel changes. The probiotic was prescribed at that time which has been effective. In fact over the last week he's developed some constipation. He noticed some blood on the tissue  11/15 and 11/16 in this context.  Stool was normal in appearance.  He had nausea and vomiting 11/10 following his cataract surgery. He attributed this to the medications perioperatively. This has not recurred.  On 11/13 he did see "old blood" in his urine. He believes this is related to prostatic biopsies 10/24.   Past medical history includes diverticulosis a colonoscopy 2011 by Dr. Arlyce Dice. Remotely he had diverticulitis in 2004.   Review of Systems He denies epistaxis, hemoptysis, or melena, or rectal bleeding.He has no unexplained weight loss, dysphagia, or abdominal pain. He has no abnormal bruising or bleeding. He has no difficulty stopping bleeding with injury.  He denies fever, chills, or sweats. There was no associated dysuria or pyuria with hematuria.     Objective:   Physical Exam General appearance :thin but adequately nourished; w/o distress.  Eyes: No conjunctival inflammation or scleral icterus is present.  Oral exam: Dental hygiene is good; lips and gums are healthy appearing.There is no oropharyngeal erythema or exudate noted.   Heart:  Slow rate and regular rhythm. S1 and S2 normal without gallop, murmur, click, rub or other extra sounds     Lungs:Chest clear to auscultation; no wheezes, rhonchi,rales ,or rubs present.No increased work of breathing.   Abdomen: bowel sounds normal, soft and non-tender without masses, organomegaly or hernias noted.  No guarding or rebound . No tenderness over the flanks to percussion.Aorta palpable ; no AAA  Musculoskeletal: Able to lie flat and sit up without  help.  Skin:Warm & dry.  Intact without suspicious lesions or rashes ; no jaundice or tenting  Lymphatic: No lymphadenopathy is noted about the head, neck, axilla.                Assessment & Plan:  #1 IBS resolved #2 rectal bleed from constipation See recommendations

## 2013-10-13 NOTE — Progress Notes (Signed)
Pre visit review using our clinic review tool, if applicable. No additional management support is needed unless otherwise documented below in the visit note. 

## 2013-10-13 NOTE — Patient Instructions (Signed)
Natural interventions to treat or prevent constipation would include drinking to thirst, up to 32 ounces of fluids daily; eating 7-9 servings of fresh fruits or vegetables a day; and increasing roughage in the diet such as whole grains. The OTC  fiber products (Example: Metamucil, etc) can be employed if these natural maneuvers do not correct the issue. Finally MiraLax every third day as needed is an option. Please take the probiotic , Align, every day ONLY if the bowels are LOOSE. This will replace the normal bacteria which  are necessary for formation of normal stool and processing of food.

## 2013-10-17 ENCOUNTER — Other Ambulatory Visit: Payer: Self-pay | Admitting: Cardiovascular Disease

## 2013-12-16 ENCOUNTER — Other Ambulatory Visit: Payer: Self-pay | Admitting: Cardiovascular Disease

## 2013-12-26 ENCOUNTER — Inpatient Hospital Stay (HOSPITAL_COMMUNITY)
Admission: EM | Admit: 2013-12-26 | Discharge: 2013-12-31 | DRG: 392 | Disposition: A | Discharge status: Home Health | Payer: Medicare Other | Attending: Internal Medicine | Admitting: Internal Medicine

## 2013-12-26 ENCOUNTER — Encounter (HOSPITAL_COMMUNITY): Payer: Self-pay | Admitting: Emergency Medicine

## 2013-12-26 DIAGNOSIS — D696 Thrombocytopenia, unspecified: Secondary | ICD-10-CM | POA: Diagnosis present

## 2013-12-26 DIAGNOSIS — R0989 Other specified symptoms and signs involving the circulatory and respiratory systems: Secondary | ICD-10-CM

## 2013-12-26 DIAGNOSIS — I4819 Other persistent atrial fibrillation: Secondary | ICD-10-CM | POA: Diagnosis present

## 2013-12-26 DIAGNOSIS — R079 Chest pain, unspecified: Secondary | ICD-10-CM

## 2013-12-26 DIAGNOSIS — Z9849 Cataract extraction status, unspecified eye: Secondary | ICD-10-CM

## 2013-12-26 DIAGNOSIS — M2550 Pain in unspecified joint: Secondary | ICD-10-CM

## 2013-12-26 DIAGNOSIS — K4090 Unilateral inguinal hernia, without obstruction or gangrene, not specified as recurrent: Secondary | ICD-10-CM

## 2013-12-26 DIAGNOSIS — E860 Dehydration: Secondary | ICD-10-CM | POA: Diagnosis present

## 2013-12-26 DIAGNOSIS — Z79899 Other long term (current) drug therapy: Secondary | ICD-10-CM

## 2013-12-26 DIAGNOSIS — N401 Enlarged prostate with lower urinary tract symptoms: Secondary | ICD-10-CM

## 2013-12-26 DIAGNOSIS — R112 Nausea with vomiting, unspecified: Secondary | ICD-10-CM | POA: Diagnosis present

## 2013-12-26 DIAGNOSIS — K5289 Other specified noninfective gastroenteritis and colitis: Secondary | ICD-10-CM

## 2013-12-26 DIAGNOSIS — Z87891 Personal history of nicotine dependence: Secondary | ICD-10-CM

## 2013-12-26 DIAGNOSIS — I4891 Unspecified atrial fibrillation: Secondary | ICD-10-CM | POA: Diagnosis present

## 2013-12-26 DIAGNOSIS — IMO0002 Reserved for concepts with insufficient information to code with codable children: Secondary | ICD-10-CM

## 2013-12-26 DIAGNOSIS — I498 Other specified cardiac arrhythmias: Secondary | ICD-10-CM | POA: Diagnosis present

## 2013-12-26 DIAGNOSIS — A088 Other specified intestinal infections: Principal | ICD-10-CM | POA: Diagnosis present

## 2013-12-26 DIAGNOSIS — Z7982 Long term (current) use of aspirin: Secondary | ICD-10-CM

## 2013-12-26 DIAGNOSIS — M810 Age-related osteoporosis without current pathological fracture: Secondary | ICD-10-CM

## 2013-12-26 DIAGNOSIS — K529 Noninfective gastroenteritis and colitis, unspecified: Secondary | ICD-10-CM | POA: Diagnosis present

## 2013-12-26 DIAGNOSIS — I4949 Other premature depolarization: Secondary | ICD-10-CM

## 2013-12-26 DIAGNOSIS — S72009A Fracture of unspecified part of neck of unspecified femur, initial encounter for closed fracture: Secondary | ICD-10-CM

## 2013-12-26 DIAGNOSIS — R5381 Other malaise: Secondary | ICD-10-CM

## 2013-12-26 DIAGNOSIS — K5730 Diverticulosis of large intestine without perforation or abscess without bleeding: Secondary | ICD-10-CM

## 2013-12-26 DIAGNOSIS — K219 Gastro-esophageal reflux disease without esophagitis: Secondary | ICD-10-CM | POA: Diagnosis present

## 2013-12-26 DIAGNOSIS — Z823 Family history of stroke: Secondary | ICD-10-CM

## 2013-12-26 DIAGNOSIS — R7309 Other abnormal glucose: Secondary | ICD-10-CM | POA: Diagnosis present

## 2013-12-26 DIAGNOSIS — R0609 Other forms of dyspnea: Secondary | ICD-10-CM

## 2013-12-26 DIAGNOSIS — R002 Palpitations: Secondary | ICD-10-CM

## 2013-12-26 DIAGNOSIS — Z8249 Family history of ischemic heart disease and other diseases of the circulatory system: Secondary | ICD-10-CM

## 2013-12-26 DIAGNOSIS — Z951 Presence of aortocoronary bypass graft: Secondary | ICD-10-CM

## 2013-12-26 DIAGNOSIS — K589 Irritable bowel syndrome without diarrhea: Secondary | ICD-10-CM

## 2013-12-26 DIAGNOSIS — Z803 Family history of malignant neoplasm of breast: Secondary | ICD-10-CM

## 2013-12-26 DIAGNOSIS — I480 Paroxysmal atrial fibrillation: Secondary | ICD-10-CM | POA: Diagnosis present

## 2013-12-26 DIAGNOSIS — D649 Anemia, unspecified: Secondary | ICD-10-CM

## 2013-12-26 DIAGNOSIS — S22009A Unspecified fracture of unspecified thoracic vertebra, initial encounter for closed fracture: Secondary | ICD-10-CM

## 2013-12-26 DIAGNOSIS — E782 Mixed hyperlipidemia: Secondary | ICD-10-CM | POA: Diagnosis present

## 2013-12-26 DIAGNOSIS — R5383 Other fatigue: Secondary | ICD-10-CM

## 2013-12-26 DIAGNOSIS — N138 Other obstructive and reflux uropathy: Secondary | ICD-10-CM

## 2013-12-26 DIAGNOSIS — I2510 Atherosclerotic heart disease of native coronary artery without angina pectoris: Secondary | ICD-10-CM | POA: Diagnosis present

## 2013-12-26 HISTORY — DX: Nausea with vomiting, unspecified: R11.2

## 2013-12-26 HISTORY — DX: Other specified postprocedural states: Z98.890

## 2013-12-26 LAB — COMPREHENSIVE METABOLIC PANEL
ALT: 14 U/L (ref 0–53)
AST: 20 U/L (ref 0–37)
Albumin: 3.6 g/dL (ref 3.5–5.2)
Alkaline Phosphatase: 51 U/L (ref 39–117)
BUN: 21 mg/dL (ref 6–23)
CALCIUM: 8.8 mg/dL (ref 8.4–10.5)
CO2: 25 mEq/L (ref 19–32)
Chloride: 100 mEq/L (ref 96–112)
Creatinine, Ser: 0.91 mg/dL (ref 0.50–1.35)
GFR calc non Af Amer: 77 mL/min — ABNORMAL LOW (ref >90)
GFR, EST AFRICAN AMERICAN: 90 mL/min — AB (ref >90)
Glucose, Bld: 156 mg/dL — ABNORMAL HIGH (ref 70–99)
Potassium: 4.3 mEq/L (ref 3.7–5.3)
SODIUM: 139 meq/L (ref 137–147)
TOTAL PROTEIN: 6.8 g/dL (ref 6.0–8.3)
Total Bilirubin: 0.6 mg/dL (ref 0.3–1.2)

## 2013-12-26 LAB — CBC WITH DIFFERENTIAL/PLATELET
BASOS ABS: 0 10*3/uL (ref 0.0–0.1)
Basophils Relative: 0 % (ref 0–1)
EOS ABS: 0.1 10*3/uL (ref 0.0–0.7)
EOS PCT: 0 % (ref 0–5)
HCT: 40.5 % (ref 39.0–52.0)
Hemoglobin: 13.9 g/dL (ref 13.0–17.0)
Lymphocytes Relative: 7 % — ABNORMAL LOW (ref 12–46)
Lymphs Abs: 0.9 10*3/uL (ref 0.7–4.0)
MCH: 31.9 pg (ref 26.0–34.0)
MCHC: 34.3 g/dL (ref 30.0–36.0)
MCV: 92.9 fL (ref 78.0–100.0)
Monocytes Absolute: 0.9 10*3/uL (ref 0.1–1.0)
Monocytes Relative: 8 % (ref 3–12)
Neutro Abs: 9.8 10*3/uL — ABNORMAL HIGH (ref 1.7–7.7)
Neutrophils Relative %: 84 % — ABNORMAL HIGH (ref 43–77)
PLATELETS: 132 10*3/uL — AB (ref 150–400)
RBC: 4.36 MIL/uL (ref 4.22–5.81)
RDW: 15.5 % (ref 11.5–15.5)
WBC: 11.7 10*3/uL — ABNORMAL HIGH (ref 4.0–10.5)

## 2013-12-26 LAB — POCT I-STAT TROPONIN I: TROPONIN I, POC: 0.01 ng/mL (ref 0.00–0.08)

## 2013-12-26 LAB — MRSA PCR SCREENING: MRSA by PCR: NEGATIVE

## 2013-12-26 LAB — CG4 I-STAT (LACTIC ACID): LACTIC ACID, VENOUS: 1.43 mmol/L (ref 0.5–2.2)

## 2013-12-26 LAB — CLOSTRIDIUM DIFFICILE BY PCR: Toxigenic C. Difficile by PCR: NEGATIVE

## 2013-12-26 LAB — MAGNESIUM: Magnesium: 2 mg/dL (ref 1.5–2.5)

## 2013-12-26 LAB — OCCULT BLOOD, POC DEVICE: Fecal Occult Bld: NEGATIVE

## 2013-12-26 LAB — LIPASE, BLOOD: Lipase: 36 U/L (ref 11–59)

## 2013-12-26 MED ORDER — MORPHINE SULFATE 2 MG/ML IJ SOLN: 2.0000 mg | INTRAMUSCULAR | Status: DC | PRN

## 2013-12-26 MED ORDER — ONDANSETRON HCL 4 MG/2ML IJ SOLN
4.0000 mg | Freq: Once | INTRAMUSCULAR | Status: AC
  Administered 2013-12-26: 4 mg via INTRAVENOUS
  Filled 2013-12-26: qty 2

## 2013-12-26 MED ORDER — ENOXAPARIN SODIUM 40 MG/0.4ML ~~LOC~~ SOLN
40.0000 mg | SUBCUTANEOUS | Status: DC
Start: 2013-12-26 — End: 2013-12-28
  Administered 2013-12-26: 40 mg via SUBCUTANEOUS
  Filled 2013-12-26 (×3): qty 0.4

## 2013-12-26 MED ORDER — ASPIRIN 325 MG PO TABS
325.0000 mg | ORAL_TABLET | Freq: Every day | ORAL | Status: DC
  Administered 2013-12-26 – 2013-12-28 (×3): 325 mg via ORAL
  Filled 2013-12-26 (×3): qty 1

## 2013-12-26 MED ORDER — SODIUM CHLORIDE 0.9 % IV SOLN
INTRAVENOUS | Status: DC
  Administered 2013-12-26 – 2013-12-27 (×2): via INTRAVENOUS

## 2013-12-26 MED ORDER — ACETAMINOPHEN 650 MG RE SUPP: 650.0000 mg | Freq: Four times a day (QID) | RECTAL | Status: DC | PRN

## 2013-12-26 MED ORDER — ONDANSETRON HCL 4 MG PO TABS
4.0000 mg | ORAL_TABLET | Freq: Four times a day (QID) | ORAL | Status: DC | PRN
  Administered 2013-12-26 – 2013-12-29 (×2): 4 mg via ORAL
  Filled 2013-12-26 (×2): qty 1

## 2013-12-26 MED ORDER — ACETAMINOPHEN 325 MG PO TABS: 650.0000 mg | ORAL_TABLET | Freq: Four times a day (QID) | ORAL | Status: DC | PRN

## 2013-12-26 MED ORDER — METOPROLOL TARTRATE 25 MG PO TABS
25.0000 mg | ORAL_TABLET | Freq: Two times a day (BID) | ORAL | Status: DC
  Administered 2013-12-26 – 2013-12-27 (×4): 25 mg via ORAL
  Filled 2013-12-26 (×6): qty 1

## 2013-12-26 MED ORDER — SODIUM CHLORIDE 0.9 % IJ SOLN
3.0000 mL | Freq: Two times a day (BID) | INTRAMUSCULAR | Status: DC
  Administered 2013-12-28 – 2013-12-31 (×6): 3 mL via INTRAVENOUS

## 2013-12-26 MED ORDER — SODIUM CHLORIDE 0.9 % IV BOLUS (SEPSIS)
500.0000 mL | Freq: Once | INTRAVENOUS | Status: AC
  Administered 2013-12-26: 500 mL via INTRAVENOUS

## 2013-12-26 MED ORDER — NITROGLYCERIN 0.4 MG SL SUBL: 0.4000 mg | SUBLINGUAL_TABLET | SUBLINGUAL | Status: DC | PRN

## 2013-12-26 MED ORDER — PANTOPRAZOLE SODIUM 40 MG PO TBEC
40.0000 mg | DELAYED_RELEASE_TABLET | Freq: Every day | ORAL | Status: DC
  Administered 2013-12-26 – 2013-12-27 (×2): 40 mg via ORAL
  Filled 2013-12-26 (×2): qty 1

## 2013-12-26 MED ORDER — ONDANSETRON HCL 4 MG/2ML IJ SOLN
4.0000 mg | Freq: Four times a day (QID) | INTRAMUSCULAR | Status: DC | PRN
  Administered 2013-12-27 – 2013-12-28 (×2): 4 mg via INTRAVENOUS
  Filled 2013-12-26 (×2): qty 2

## 2013-12-26 MED ORDER — PANTOPRAZOLE SODIUM 40 MG IV SOLR
40.0000 mg | Freq: Once | INTRAVENOUS | Status: AC
  Administered 2013-12-26: 40 mg via INTRAVENOUS
  Filled 2013-12-26: qty 40

## 2013-12-26 MED ORDER — HYDROCODONE-ACETAMINOPHEN 5-325 MG PO TABS: 1.0000 | ORAL_TABLET | ORAL | Status: DC | PRN

## 2013-12-26 MED ORDER — ATORVASTATIN CALCIUM 20 MG PO TABS
20.0000 mg | ORAL_TABLET | Freq: Every day | ORAL | Status: DC
  Administered 2013-12-26 – 2013-12-31 (×5): 20 mg via ORAL
  Filled 2013-12-26 (×6): qty 1

## 2013-12-26 NOTE — Progress Notes (Signed)
Received pt assignment from charge at 0800. Report called from ED at [facility], finished report at [facility]. Awaiting pt arrival to floor.

## 2013-12-26 NOTE — ED Notes (Addendum)
EDP Wickline at the bedside updating pt. EDP aware that pt is unable to void at this time for urine sample.

## 2013-12-26 NOTE — ED Notes (Signed)
Pt given water for po challenge per Dr. Raechel Chute request.

## 2013-12-26 NOTE — Progress Notes (Signed)
Patient states that he has been having diarrhea, along with his nausea and vomiting. Provider [name] was contact to see if a c-diff sample should be obtain. Provider give verbal order for this and patient is already on enteric precautions. Will continue to monitor.   Valda Lamb RN

## 2013-12-26 NOTE — Progress Notes (Signed)
Pt arrived to unit from ED at 0914.

## 2013-12-26 NOTE — H&P (Addendum)
[facility] History and Physical  JAVAR ESHBACH ZSW:[id number] DOB: [birth year] DOA: 12/26/2013  Referring physician:  PCP: Unice Cobble, MD  Specialists:   Chief Complaint: Nausea and vomiting  HPI: [name] is a 80 y.o. male  With a history of coronary artery disease status post bypass, nature fibrillation, hyperlipidemia that presents emergency department with complaints of nausea and vomiting that began around 4:00 yesterday afternoon. Patient does state he is living in assisted living community however does not claim to have been around sick contacts recently. Patient stated that his symptoms of nausea and vomiting started around 4:00, he was able to keep breakfast and lunch with no problems. His vomiting episodes occur approximately every 30 minutes to one hour. Patient states he did not notice any blood in his vomit. He did state that he has had some abdominal pain. He denies any cough or fever at this time. Nothing seems to make the vomiting better or worse. Patient was admitted for similar episode in 2014.  Patient recently saw Dr. Linna Darner for similar complaints and was thought to have IBS which was aggravated by stress and wife's health issues.  Review of Systems:  Constitutional: Denies fever, chills, diaphoresis.  Patient does complain of fatigue and poor appetite at this time. HEENT: Denies photophobia, eye pain, redness, hearing loss, ear pain, congestion, sore throat, rhinorrhea, sneezing, mouth sores, trouble swallowing, neck pain, neck stiffness and tinnitus.   Respiratory: Denies SOB, DOE, cough, chest tightness,  and wheezing.   Cardiovascular: Denies chest pain, palpitations and leg swelling.  Gastrointestinal: Complains of abdominal pain, nausea, vomiting, one episode of diarrhea.  Genitourinary: Denies dysuria, urgency, frequency, hematuria, flank pain and difficulty urinating.  Musculoskeletal: Denies myalgias, back pain, joint swelling, arthralgias and gait  problem.  Skin: Denies pallor, rash and wound.  Neurological: Denies dizziness, seizures, syncope, weakness, light-headedness, numbness and headaches.  Hematological: Denies adenopathy. Easy bruising, personal or family bleeding history  Psychiatric/Behavioral: Denies suicidal ideation, mood changes, confusion, nervousness, sleep disturbance and agitation  Past Medical History  Diagnosis Date  . History of heart bypass surgery   . Mixed hyperlipidemia   . UNSPECIFIED ANEMIA   . CAD   . Atrial fibrillation   . PREMATURE VENTRICULAR CONTRACTIONS   . Esophageal reflux   . IBS   . HYPERPLASIA, PROSTATE NOS W/URINARY OBST/LUTS   . LUMBAR RADICULOPATHY, RIGHT   . OSTEOPOROSIS   . Palpitations   . INGUINAL HERNIA   . Lichen planus 3220    [name]  . Diverticulosis 2011    Diverticulitis 2004  . Bronchitis, acute 1/7-12/06/2011    cough, nausea & vomiting   Past Surgical History  Procedure Laterality Date  . Hip fracture surgery      Dr.Dahldorff  . Knee surgery      Right  . Hip fracture surgery  2009  . Inguinal hernia repair  2012    Dr Brantley Stage  . Coronary artery bypass graft      2 VD;anomalous RCA;post op compliacted by AF  . Colonoscopy  2011    Diverticulosis; Dr Deatra Ina  . Prostate biopsy  09/19/2013    [facility]  . Cataract extraction  10/06/2013   Social History:  reports that he quit smoking about 51 years ago. He has never used smokeless tobacco. He reports that he does not drink alcohol or use illicit drugs. Lives in assisted living community.  Allergies  Allergen Reactions  . Amoxicillin Other (See Comments)    REACTION:  n/v/d    Family History  Problem Relation Age of Onset  . Breast cancer Sister   . Stroke Mother   . Heart disease Father   . Heart disease Sister 50    Prior to Admission medications   Medication Sig Start Date End Date Taking? Authorizing Provider  aspirin 325 MG tablet Take 325 mg by mouth daily.     Yes Historical  Provider, MD  Calcium Carbonate Antacid (TUMS PO) Take 1 tablet by mouth 2 (two) times daily as needed. For calcium    Yes Historical Provider, MD  cholecalciferol (VITAMIN D) 1000 UNITS tablet Take 1,000 Units by mouth every morning.     Yes Historical Provider, MD  FIBER PO Take 2 capsules by mouth every morning.     Yes Historical Provider, MD  folic acid (FOLVITE) 1 MG tablet Take 1 tablet (1 mg total) by mouth daily. 06/19/13  Yes Josue Hector, MD  metoprolol tartrate (LOPRESSOR) 25 MG tablet Take 25 mg by mouth 2 (two) times daily.   Yes Historical Provider, MD  Multiple Vitamin (MULITIVITAMIN WITH MINERALS) TABS Take 1 tablet by mouth daily.     Yes Historical Provider, MD  nitroGLYCERIN (NITROSTAT) 0.4 MG SL tablet Place 1 tablet (0.4 mg total) under the tongue every 5 (five) minutes as needed for chest pain. 07/31/12 12/26/13 Yes Hendricks Limes, MD  rosuvastatin (CRESTOR) 10 MG tablet Take 10 mg by mouth daily.   Yes Historical Provider, MD   Physical Exam: Filed Vitals:   12/26/13 1010  BP: 107/65  Pulse: 93  Temp:   Resp:      General: Well developed, well nourished, NAD, appears stated age  HEENT: NCAT, PERRLA, EOMI, Anicteic Sclera, mucous membranes dry. No pharyngeal erythema or exudates  Neck: Supple, no JVD, no masses  Cardiovascular: S1 S2 auscultated, no rubs, murmurs or gallops.   Respiratory: Clear to auscultation bilaterally with equal chest rise  Abdomen: Soft, nontender, nondistended, + bowel sounds  Extremities: warm dry without cyanosis clubbing or edema  Neuro: AAOx3, cranial nerves grossly intact. Strength 5/5 in patient's upper and lower extremities bilaterally  Skin: Without rashes exudates or nodules  Psych: Normal affect and demeanor with intact judgement and insight  Labs on Admission:  Basic Metabolic Panel:  Recent Labs Lab 12/26/13 0500  NA 139  K 4.3  CL 100  CO2 25  GLUCOSE 156*  BUN 21  CREATININE 0.91  CALCIUM 8.8   Liver  Function Tests:  Recent Labs Lab 12/26/13 0500  AST 20  ALT 14  ALKPHOS 51  BILITOT 0.6  PROT 6.8  ALBUMIN 3.6    Recent Labs Lab 12/26/13 0500  LIPASE 36   No results found for this basename: AMMONIA,  in the last 168 hours CBC:  Recent Labs Lab 12/26/13 0500  WBC 11.7*  NEUTROABS 9.8*  HGB 13.9  HCT 40.5  MCV 92.9  PLT 132*   Cardiac Enzymes: No results found for this basename: CKTOTAL, CKMB, CKMBINDEX, TROPONINI,  in the last 168 hours  BNP (last 3 results) No results found for this basename: PROBNP,  in the last 8760 hours CBG: No results found for this basename: GLUCAP,  in the last 168 hours  Radiological Exams on Admission: No results found.  EKG: Independently reviewed. Sinus rhythm, rate 75, PAC  Assessment/Plan Principal Problem:   Gastroenteritis Active Problems:   Mixed hyperlipidemia   CAD   Atrial fibrillation   Esophageal reflux   Dehydration  Nausea with vomiting   Nausea and vomiting likely gastroenteritis Patient will be admitted to medical floor. He'll be placed on IV fluids as well as a clear liquid diet and advance diet as tolerated. At this time patient is afebrile no leukocytosis, this is likely viral etiology. Not start any antibiotics at this time. Will give patient antibiotics as well as pain control as needed. Patient did have one episode of diarrhea yesterday however this has subsided, will obtain C diff PCR.  Dehydration Likely secondary to patient's nausea and vomiting. Again will place him on IV fluids.  History of coronary artery disease status post bypass Patient is currently chest pain-free. Will continue to monitor. Will continue full dose aspirin as well as metoprolol at this time.  History of atrial fibrillation Currently rate and rhythm control. Patient is only on aspirin 325 mg daily. Will continue this. We'll also continue Toprol for rate control.  Hyperlipidemia Will continue statin.  GERD Will place  patient on Protonix.  DVT prophylaxis:  Lovenox  Code Status: Full  Condition: Guarded  Family Communication: None at bedside. Admission, patients condition and plan of care including tests being ordered have been discussed with the patient, who indicates understanding and agrees with the plan and Code Status.  Disposition Plan: Observation   Time spent: 45 minutes  Lucresia Simic D.O. [facility] Pager [phone]  If 7PM-7AM, please contact night-coverage [email or web address] Password [email or web address] 12/26/2013, 10:14 AM

## 2013-12-26 NOTE — Progress Notes (Signed)
Pt admitted to the unit at 0914. Pt mental status is alert & oriented x 4. Pt oriented to room, staff, and call bell. Skin is intact. Full assessment charted in [facility]. Call bell within reach. Visitor guidelines reviewed w/ pt and/or family.

## 2013-12-26 NOTE — ED Notes (Signed)
Per EMS, pt coming from independent living  has been having generalized weakness x2 days, N/V x 12 hours. SOB x 2 hours. Pt received 4mg  of Zofran in route with no relief. Pt also received 500 ml normal saline. 18 gauge left wrist,

## 2013-12-26 NOTE — ED Provider Notes (Signed)
CSN: [id number]     Arrival date & time 12/26/13  0444 History   First MD Initiated Contact with Patient 12/26/13 [phone]     Chief Complaint  Patient presents with  . Nausea  . Emesis    Patient is a 80 y.o. male presenting with vomiting. The history is provided by the patient.  Emesis Severity:  Moderate Duration:  12 hours Timing:  Intermittent Progression:  Worsening Chronicity:  Recurrent Relieved by:  Nothing Worsened by:  Nothing tried Associated symptoms: abdominal pain and diarrhea   Associated symptoms: no cough and no fever   pt reports he developed nausea/vomiting and diarrhea about 12 hours ago No blood reported in stool/vomit though it may have been dark No CP No SOB reported at this time No dysuria He reports this is similar to prior episodes  Past Medical History  Diagnosis Date  . History of heart bypass surgery   . Mixed hyperlipidemia   . UNSPECIFIED ANEMIA   . CAD   . Atrial fibrillation   . PREMATURE VENTRICULAR CONTRACTIONS   . Esophageal reflux   . IBS   . HYPERPLASIA, PROSTATE NOS W/URINARY OBST/LUTS   . LUMBAR RADICULOPATHY, RIGHT   . OSTEOPOROSIS   . Palpitations   . INGUINAL HERNIA   . Lichen planus [id number]    [name]  . Diverticulosis 2011    Diverticulitis 2004  . Bronchitis, acute 1/7-12/06/2011    cough, nausea & vomiting   Past Surgical History  Procedure Laterality Date  . Hip fracture surgery      Dr.Dahldorff  . Knee surgery      Right  . Hip fracture surgery  2009  . Inguinal hernia repair  2012    Dr Brantley Stage  . Coronary artery bypass graft      2 VD;anomalous RCA;post op compliacted by AF  . Colonoscopy  2011    Diverticulosis; Dr Deatra Ina  . Prostate biopsy  09/19/2013    [facility]  . Cataract extraction  10/06/2013   Family History  Problem Relation Age of Onset  . Breast cancer Sister   . Stroke Mother   . Heart disease Father   . Heart disease Sister 39   History  Substance Use Topics  . Smoking  status: Former Smoker    Quit date: 11/27/1962  . Smokeless tobacco: Never Used  . Alcohol Use: No     Comment: 2 oz wine/ night    Review of Systems  Constitutional: Positive for fatigue. Negative for fever.  Respiratory: Negative for cough.   Cardiovascular: Negative for chest pain.  Gastrointestinal: Positive for vomiting, abdominal pain and diarrhea.  Genitourinary: Negative for dysuria.  Neurological: Negative for syncope.  All other systems reviewed and are negative.    Allergies  Amoxicillin  Home Medications   Current Outpatient Rx  Name  Route  Sig  Dispense  Refill  . aspirin 325 MG tablet   Oral   Take 325 mg by mouth daily.           . Bromfenac Sodium (PROLENSA) 0.07 % SOLN   Ophthalmic   Apply 1 drop to eye daily.         . Calcium Carbonate Antacid (TUMS PO)   Oral   Take 1 tablet by mouth 2 (two) times daily as needed. For calcium          . cholecalciferol (VITAMIN D) 1000 UNITS tablet   Oral   Take 1,000 Units by mouth every  morning.           [name] CRESTOR 10 MG tablet      TAKE 1 TABLET ONCE DAILY.   30 tablet   3   . Difluprednate (DUREZOL) 0.05 % EMUL   Ophthalmic   Apply 1 drop to eye 2 (two) times daily.         [name] FIBER PO   Oral   Take 2 capsules by mouth every morning.           . folic acid (FOLVITE) 1 MG tablet   Oral   Take 1 tablet (1 mg total) by mouth daily.   30 tablet   6   . meclizine (ANTIVERT) 25 MG tablet      1/2- 1 every 8 hrs prn   30 tablet   0   . metoprolol tartrate (LOPRESSOR) 25 MG tablet      TAKE 1 TABLET TWICE DAILY.   180 tablet   1   . Multiple Vitamin (MULITIVITAMIN WITH MINERALS) TABS   Oral   Take 1 tablet by mouth daily.           [name] EXPIRED: nitroGLYCERIN (NITROSTAT) 0.4 MG SL tablet   Sublingual   Place 1 tablet (0.4 mg total) under the tongue every 5 (five) minutes as needed for chest pain.   30 tablet   3   . ranitidine (ZANTAC) 150 MG tablet      TAKE 1 TABLET  TWICE DAILY.   60 tablet   5   . traMADol (ULTRAM) 50 MG tablet   Oral   Take 1 tablet (50 mg total) by mouth every 8 (eight) hours as needed for pain.   30 tablet   1    BP 123/66  Pulse 76  Temp(Src) 98 F (36.7 C) (Oral)  Resp 20  Ht 6\' 4"  (1.93 m)  Wt 168 lb (76.204 kg)  BMI 20.46 kg/m2  SpO2 99% Physical Exam CONSTITUTIONAL: Well developed/well nourished HEAD: Normocephalic/atraumatic EYES: EOMI/PERRL, no icterus ENMT: Mucous membranes moist NECK: supple no meningeal signs SPINE:entire spine nontender CV: S1/S2 noted, no murmurs/rubs/gallops noted LUNGS: Lungs are clear to auscultation bilaterally, no apparent distress ABDOMEN: soft, nontender, no rebound or guarding GU:no cva tenderness Rectal - stool color brown, no melena or blood, chaperone present NEURO: Pt is awake/alert, moves all extremitiesx4 EXTREMITIES: pulses normal, full ROM SKIN: warm, color normal PSYCH: no abnormalities of mood noted  ED Course  Procedures (including critical care time) 5:28 AM Pt well appearing, no distress, labs pending at this time He admits he has had this before.  He has no signs of acute abdominal process.  I doubt ACS as cause of vomiting No signs of GI bleed 7:00 AM Pt is taking PO His labs are reassuring 7:42 AM Pt now feeling worse, he was tachycardic upon standing and felt weak as he may have syncope Will admit for rehydration/monitoring D/w dr [name] will admit to tele OBS Labs Review Labs Reviewed  CBC WITH DIFFERENTIAL  COMPREHENSIVE METABOLIC PANEL  LIPASE, BLOOD  URINALYSIS, ROUTINE W REFLEX MICROSCOPIC  CG4 I-STAT (LACTIC ACID)  POCT I-STAT TROPONIN I  OCCULT BLOOD, POC DEVICE   Imaging Review No results found.  EKG Interpretation   None       MDM  No diagnosis found. Nursing notes including past medical history and social history reviewed and considered in documentation Labs/vital reviewed and considered      Date: 12/26/2013  Rate:  75  Rhythm:  normal sinus rhythm  QRS Axis: normal  Intervals: normal  ST/T Wave abnormalities: nonspecific ST changes  Conduction Disutrbances:nonspecific intraventricular conduction delay  Narrative Interpretation:   Old EKG Reviewed: unchanged    Sharyon Cable, MD 12/26/13 [phone]

## 2013-12-26 NOTE — ED Notes (Signed)
Dr. Wickline at bedside.  

## 2013-12-26 NOTE — ED Notes (Signed)
Attempted to collect urine, pt unable to void.

## 2013-12-27 LAB — CBC
HEMATOCRIT: 40.2 % (ref 39.0–52.0)
HEMOGLOBIN: 13.5 g/dL (ref 13.0–17.0)
MCH: 32.1 pg (ref 26.0–34.0)
MCHC: 33.6 g/dL (ref 30.0–36.0)
MCV: 95.5 fL (ref 78.0–100.0)
Platelets: 122 10*3/uL — ABNORMAL LOW (ref 150–400)
RBC: 4.21 MIL/uL — AB (ref 4.22–5.81)
RDW: 16.1 % — ABNORMAL HIGH (ref 11.5–15.5)
WBC: 12.5 10*3/uL — AB (ref 4.0–10.5)

## 2013-12-27 LAB — BASIC METABOLIC PANEL
BUN: 22 mg/dL (ref 6–23)
CO2: 24 meq/L (ref 19–32)
Calcium: 8.8 mg/dL (ref 8.4–10.5)
Chloride: 105 mEq/L (ref 96–112)
Creatinine, Ser: 0.98 mg/dL (ref 0.50–1.35)
GFR calc Af Amer: 87 mL/min — ABNORMAL LOW (ref >90)
GFR calc non Af Amer: 75 mL/min — ABNORMAL LOW (ref >90)
Glucose, Bld: 120 mg/dL — ABNORMAL HIGH (ref 70–99)
POTASSIUM: 4.8 meq/L (ref 3.7–5.3)
SODIUM: 143 meq/L (ref 137–147)

## 2013-12-27 LAB — TSH: TSH: 0.576 u[IU]/mL (ref 0.350–4.500)

## 2013-12-27 MED ORDER — PROMETHAZINE HCL 25 MG/ML IJ SOLN
6.2500 mg | Freq: Once | INTRAMUSCULAR | Status: AC
  Administered 2013-12-27: 6.25 mg via INTRAVENOUS
  Filled 2013-12-27: qty 1

## 2013-12-27 MED ORDER — PANTOPRAZOLE SODIUM 40 MG PO TBEC: 40.0000 mg | DELAYED_RELEASE_TABLET | Freq: Every day | ORAL | Status: DC

## 2013-12-27 MED ORDER — ONDANSETRON HCL 4 MG PO TABS: 4.0000 mg | ORAL_TABLET | Freq: Four times a day (QID) | ORAL | Status: DC | PRN

## 2013-12-27 MED ORDER — NITROGLYCERIN 0.4 MG SL SUBL: 0.4000 mg | SUBLINGUAL_TABLET | SUBLINGUAL | Status: DC | PRN

## 2013-12-27 MED ORDER — PROMETHAZINE HCL 25 MG/ML IJ SOLN
12.5000 mg | Freq: Four times a day (QID) | INTRAMUSCULAR | Status: DC | PRN
  Administered 2013-12-27 – 2013-12-29 (×3): 12.5 mg via INTRAVENOUS
  Filled 2013-12-27 (×3): qty 1

## 2013-12-27 MED ORDER — PROMETHAZINE HCL 25 MG/ML IJ SOLN: 6.2500 mg | Freq: Four times a day (QID) | INTRAMUSCULAR | Status: DC | PRN

## 2013-12-27 MED ORDER — SODIUM CHLORIDE 0.9 % IV SOLN
INTRAVENOUS | Status: DC
  Administered 2013-12-27: 16:00:00 via INTRAVENOUS

## 2013-12-27 MED ORDER — PANTOPRAZOLE SODIUM 40 MG IV SOLR
40.0000 mg | INTRAVENOUS | Status: DC
  Administered 2013-12-27 – 2013-12-29 (×3): 40 mg via INTRAVENOUS
  Filled 2013-12-27 (×4): qty 40

## 2013-12-27 MED ORDER — ALBUTEROL SULFATE (2.5 MG/3ML) 0.083% IN NEBU
2.5000 mg | INHALATION_SOLUTION | Freq: Three times a day (TID) | RESPIRATORY_TRACT | Status: DC
  Administered 2013-12-27 – 2013-12-28 (×3): 2.5 mg via RESPIRATORY_TRACT
  Filled 2013-12-27 (×3): qty 3

## 2013-12-27 NOTE — Progress Notes (Signed)
Utilization review completed.  P.J. Teigan Sahli,RN,BSN Case Manager [phone]  

## 2013-12-27 NOTE — Progress Notes (Signed)
Pt experience nausea and small episode of vomiting. PO prn nausea med previously admin. Physician paged for additional med. Physician ordered one time dose of phenergan. Will continue to monitor pt. Pt in no s/s of distress.

## 2013-12-27 NOTE — Discharge Summary (Addendum)
PATIENT DETAILS Name: [name] Age: 80 y.o. Sex: male Date of Birth: 11-15-1930 MRN: [id number]. Admit Date: 12/26/2013 Admitting Physician: Cristal Ford, DO [id number] Linna Darner, MD  Recommendations for Outpatient Follow-up:  1. Gen. health maintenance. 2. Suggest repeat CBC and chemistries at next visit  PRIMARY DISCHARGE DIAGNOSIS:  Principal Problem:   Gastroenteritis- viral Active Problems:   Mixed hyperlipidemia   CAD   Atrial fibrillation   Esophageal reflux   Dehydration   Nausea with vomiting      PAST MEDICAL HISTORY: Past Medical History  Diagnosis Date  . History of heart bypass surgery   . Mixed hyperlipidemia   . UNSPECIFIED ANEMIA   . CAD   . Atrial fibrillation   . PREMATURE VENTRICULAR CONTRACTIONS   . Esophageal reflux   . IBS   . HYPERPLASIA, PROSTATE NOS W/URINARY OBST/LUTS   . LUMBAR RADICULOPATHY, RIGHT   . OSTEOPOROSIS   . Palpitations   . INGUINAL HERNIA   . Lichen planus [id number]    [name]  . Diverticulosis 2011    Diverticulitis 2004  . Bronchitis, acute 1/7-12/06/2011    cough, nausea & vomiting  . Complication of anesthesia     extreme nausea  . PONV (postoperative nausea and vomiting)     DISCHARGE MEDICATIONS:   Medication List         aspirin 325 MG tablet  Take 325 mg by mouth daily.     cholecalciferol 1000 UNITS tablet  Commonly known as:  VITAMIN D  Take 1,000 Units by mouth every morning.     FIBER PO  Take 2 capsules by mouth every morning.     folic acid 1 MG tablet  Commonly known as:  FOLVITE  Take 1 tablet (1 mg total) by mouth daily.     metoprolol tartrate 25 MG tablet  Commonly known as:  LOPRESSOR  Take 25 mg by mouth 2 (two) times daily.     multivitamin with minerals Tabs tablet  Take 1 tablet by mouth daily.     nitroGLYCERIN 0.4 MG SL tablet  Commonly known as:  NITROSTAT  Place 1 tablet (0.4 mg total) under the tongue every 5 (five) minutes as needed for chest pain.     ondansetron 4 MG tablet  Commonly known as:  ZOFRAN  Take 1 tablet (4 mg total) by mouth every 6 (six) hours as needed for nausea or vomiting.     pantoprazole 40 MG tablet  Commonly known as:  PROTONIX  Take 1 tablet (40 mg total) by mouth daily.     rosuvastatin 10 MG tablet  Commonly known as:  CRESTOR  Take 10 mg by mouth daily.     TUMS PO  Take 1 tablet by mouth 2 (two) times daily as needed. For calcium        ALLERGIES:   Allergies  Allergen Reactions  . Amoxicillin Other (See Comments)    REACTION: n/v/d    BRIEF HPI:  See H&P, Labs, Consult and Test reports for all details in brief, patient is 80 year old man who lives in independent living facility, history of coronary artery disease status post CABG, hypertension who presented to the ED with complaints of nausea and vomiting. He was admitted for further evaluation and treatment  CONSULTATIONS:   None  PERTINENT RADIOLOGIC STUDIES: No results found.   PERTINENT LAB RESULTS: CBC:  Recent Labs  12/26/13 0500 12/27/13 0500  WBC 11.7* 12.5*  HGB 13.9 13.5  HCT 40.5 40.2  PLT 132* 122*   CMET CMP     Component Value Date/Time   NA 143 12/27/2013 0500   K 4.8 12/27/2013 0500   CL 105 12/27/2013 0500   CO2 24 12/27/2013 0500   GLUCOSE 120* 12/27/2013 0500   BUN 22 12/27/2013 0500   CREATININE 0.98 12/27/2013 0500   CALCIUM 8.8 12/27/2013 0500   PROT 6.8 12/26/2013 0500   ALBUMIN 3.6 12/26/2013 0500   AST 20 12/26/2013 0500   ALT 14 12/26/2013 0500   ALKPHOS 51 12/26/2013 0500   BILITOT 0.6 12/26/2013 0500   GFRNONAA 75* 12/27/2013 0500   GFRAA 87* 12/27/2013 0500    GFR Estimated Creatinine Clearance: 60.9 ml/min (by C-G formula based on Cr of 0.98).  Recent Labs  12/26/13 0500  LIPASE 36   No results found for this basename: CKTOTAL, CKMB, CKMBINDEX, TROPONINI,  in the last 72 hours No components found with this basename: POCBNP,  No results found for this basename: DDIMER,  in the last 72  hours No results found for this basename: HGBA1C,  in the last 72 hours No results found for this basename: CHOL, HDL, LDLCALC, TRIG, CHOLHDL, LDLDIRECT,  in the last 72 hours No results found for this basename: TSH, T4TOTAL, FREET3, T3FREE, THYROIDAB,  in the last 72 hours No results found for this basename: VITAMINB12, FOLATE, FERRITIN, TIBC, IRON, RETICCTPCT,  in the last 72 hours Coags: No results found for this basename: PT, INR,  in the last 72 hours Microbiology: Recent Results (from the past 240 hour(s))  MRSA PCR SCREENING     Status: None   Collection Time    12/26/13  9:57 AM      Result Value Range Status   MRSA by PCR NEGATIVE  NEGATIVE Final   Comment:            The GeneXpert MRSA Assay (FDA     approved for NASAL specimens     only), is one component of a     comprehensive MRSA colonization     surveillance program. It is not     intended to diagnose MRSA     infection nor to guide or     monitor treatment for     MRSA infections.  CLOSTRIDIUM DIFFICILE BY PCR     Status: None   Collection Time    12/26/13  3:25 PM      Result Value Range Status   C difficile by pcr NEGATIVE  NEGATIVE Final     BRIEF HOSPITAL COURSE:   Principal Problem:   Suspected viral Gastroenteritis - Patient was admitted with nausea and vomiting, he did not have abdominal pain. He denied any diarrhea. He did not have any fever or leukocytosis. At this time suspected etiology of nausea and vomiting is a viral syndrome. He was admitted, given supportive care with IV fluids and antiemetics. He was initially placed on a clear liquid diet, and this was slowly advanced to a full liquid diet. He does not have much of an appetite, however he did finish a significant portion of his [place] sandwich this morning. He has had no further nausea vomiting throughout the day today. He was also ambulated in the hallway with close supervision by a nursing technician. He currently is deemed stable to be  discharged back to his independent living facility. He will be provided with as needed antiemetics. He has been advised to followup with his primary care practitioner within one week. - Please note C.  difficile PCR was negative.  Active Problems:   Mixed hyperlipidemia - Stable, continue with statins on discharge.    CAD - Stable, continue with aspirin, metoprolol and statins.    Esophageal reflux - Prescribed PPI on discharge.    Dehydration - Resolved with IV fluids.  TODAY-DAY OF DISCHARGE:  Subjective:   Jerrie Gullo today has no headache,no chest abdominal pain,no new weakness tingling or numbness, feels much better. He no longer has any vomiting. He has tolerated a full liquid diet for breakfast, subsequently later in the morning he has tolerated a [place] sandwich without major issues. He is also ambulated in the hallway without major issues.  Objective:   Blood pressure 114/52, pulse 73, temperature 99.1 F (37.3 C), temperature source Oral, resp. rate 20, height 6\' 4"  (1.93 m), weight 72.802 kg (160 lb 8 oz), SpO2 97.00%.  Intake/Output Summary (Last 24 hours) at 12/27/13 1145 Last data filed at 12/27/13 0615  Gross per 24 hour  Intake   1430 ml  Output    450 ml  Net    980 ml   Filed Weights   12/26/13 0454 12/26/13 0922  Weight: 76.204 kg (168 lb) 72.802 kg (160 lb 8 oz)    Exam Awake Alert, Oriented *3, No new F.N deficits, Normal affect Carmi.AT,PERRAL Supple Neck,No JVD, No cervical lymphadenopathy appriciated.  Symmetrical Chest wall movement, Good air movement bilaterally, CTAB RRR,No Gallops,Rubs or new Murmurs, No Parasternal Heave +ve B.Sounds, Abd Soft, Non tender, No organomegaly appriciated, No rebound -guarding or rigidity. No Cyanosis, Clubbing or edema, No new Rash or bruise  DISCHARGE CONDITION: Stable  DISPOSITION: Independent living facility  DISCHARGE INSTRUCTIONS:    Activity:  As tolerated with Full fall precautions use walker/cane &  assistance as needed  Diet recommendation: Heart Healthy diet  Discharge Orders   Future Orders Complete By Expires   Call MD for:  persistant nausea and vomiting  As directed    Diet - low sodium heart healthy  As directed    Increase activity slowly  As directed       Follow-up Information   Follow up with Unice Cobble, MD. Schedule an appointment as soon as possible for a visit in 1 week.   Specialty:  Internal Medicine   Contact information:   [phone] [address] [facility] [address] [place] [address] [phone]       Total Time spent on discharge equals 45 minutes.  SignedOren Binet 12/27/2013 11:45 AM

## 2013-12-27 NOTE — Progress Notes (Signed)
Pt complaining of nausea and "lunch not settling well". MD notified. PRN zofran given.

## 2013-12-27 NOTE — Progress Notes (Signed)
Patient became nauseous after eating lunch, will hold discharge, continue to provide supportive care.

## 2013-12-28 ENCOUNTER — Encounter (HOSPITAL_COMMUNITY): Payer: Self-pay | Admitting: Physician Assistant

## 2013-12-28 ENCOUNTER — Inpatient Hospital Stay (HOSPITAL_COMMUNITY): Payer: Medicare Other

## 2013-12-28 DIAGNOSIS — I2510 Atherosclerotic heart disease of native coronary artery without angina pectoris: Secondary | ICD-10-CM

## 2013-12-28 DIAGNOSIS — I480 Paroxysmal atrial fibrillation: Secondary | ICD-10-CM | POA: Diagnosis present

## 2013-12-28 DIAGNOSIS — I4819 Other persistent atrial fibrillation: Secondary | ICD-10-CM | POA: Diagnosis present

## 2013-12-28 LAB — BASIC METABOLIC PANEL
BUN: 24 mg/dL — ABNORMAL HIGH (ref 6–23)
BUN: 23 mg/dL (ref 6–23)
CHLORIDE: 105 meq/L (ref 96–112)
CO2: 24 mEq/L (ref 19–32)
CO2: 26 meq/L (ref 19–32)
Calcium: 8.3 mg/dL — ABNORMAL LOW (ref 8.4–10.5)
Calcium: 8.8 mg/dL (ref 8.4–10.5)
Chloride: 106 mEq/L (ref 96–112)
Creatinine, Ser: 0.81 mg/dL (ref 0.50–1.35)
Creatinine, Ser: 0.86 mg/dL (ref 0.50–1.35)
GFR calc Af Amer: >90 mL/min (ref >90)
GFR calc non Af Amer: 79 mL/min — ABNORMAL LOW (ref >90)
GFR, EST NON AFRICAN AMERICAN: 81 mL/min — AB (ref >90)
GLUCOSE: 157 mg/dL — AB (ref 70–99)
Glucose, Bld: 142 mg/dL — ABNORMAL HIGH (ref 70–99)
POTASSIUM: 3.8 meq/L (ref 3.7–5.3)
POTASSIUM: 4.1 meq/L (ref 3.7–5.3)
SODIUM: 144 meq/L (ref 137–147)
Sodium: 144 mEq/L (ref 137–147)

## 2013-12-28 LAB — CBC
HCT: 37.7 % — ABNORMAL LOW (ref 39.0–52.0)
HCT: 39.9 % (ref 39.0–52.0)
HEMOGLOBIN: 13.4 g/dL (ref 13.0–17.0)
Hemoglobin: 12.6 g/dL — ABNORMAL LOW (ref 13.0–17.0)
MCH: 31.7 pg (ref 26.0–34.0)
MCH: 32.3 pg (ref 26.0–34.0)
MCHC: 33.4 g/dL (ref 30.0–36.0)
MCHC: 33.6 g/dL (ref 30.0–36.0)
MCV: 95 fL (ref 78.0–100.0)
MCV: 96.1 fL (ref 78.0–100.0)
Platelets: 120 10*3/uL — ABNORMAL LOW (ref 150–400)
Platelets: 127 10*3/uL — ABNORMAL LOW (ref 150–400)
RBC: 3.97 MIL/uL — ABNORMAL LOW (ref 4.22–5.81)
RBC: 4.15 MIL/uL — AB (ref 4.22–5.81)
RDW: 16.2 % — AB (ref 11.5–15.5)
RDW: 16.5 % — ABNORMAL HIGH (ref 11.5–15.5)
WBC: 11.8 10*3/uL — AB (ref 4.0–10.5)
WBC: 12.7 10*3/uL — ABNORMAL HIGH (ref 4.0–10.5)

## 2013-12-28 LAB — HEMOGLOBIN A1C
Hgb A1c MFr Bld: 6 % — ABNORMAL HIGH (ref <5.7)
Mean Plasma Glucose: 126 mg/dL — ABNORMAL HIGH (ref <117)

## 2013-12-28 LAB — TROPONIN I
TROPONIN I: 0.32 ng/mL — AB (ref <0.30)
TROPONIN I: 0.31 ng/mL — AB (ref <0.30)
Troponin I: 0.33 ng/mL (ref <0.30)

## 2013-12-28 LAB — PROTIME-INR
INR: 1.15 (ref 0.00–1.49)
Prothrombin Time: 14.5 seconds (ref 11.6–15.2)

## 2013-12-28 LAB — HEPARIN LEVEL (UNFRACTIONATED): HEPARIN UNFRACTIONATED: 0.26 [IU]/mL — AB (ref 0.30–0.70)

## 2013-12-28 LAB — PRO B NATRIURETIC PEPTIDE: Pro B Natriuretic peptide (BNP): 15279 pg/mL — ABNORMAL HIGH (ref 0–450)

## 2013-12-28 MED ORDER — HEPARIN BOLUS VIA INFUSION
2000.0000 [IU] | Freq: Once | INTRAVENOUS | Status: AC
Start: 2013-12-28 — End: 2013-12-28
  Administered 2013-12-28: 2000 [IU] via INTRAVENOUS
  Filled 2013-12-28: qty 2000

## 2013-12-28 MED ORDER — HEPARIN (PORCINE) IN NACL 100-0.45 UNIT/ML-% IJ SOLN
1250.0000 [IU]/h | INTRAMUSCULAR | Status: DC
  Administered 2013-12-28: 1100 [IU]/h via INTRAVENOUS
  Administered 2013-12-29: 1250 [IU]/h via INTRAVENOUS
  Filled 2013-12-28 (×2): qty 250

## 2013-12-28 MED ORDER — METOPROLOL TARTRATE 50 MG PO TABS
50.0000 mg | ORAL_TABLET | Freq: Two times a day (BID) | ORAL | Status: DC
  Administered 2013-12-28 – 2013-12-30 (×5): 50 mg via ORAL
  Filled 2013-12-28 (×6): qty 1

## 2013-12-28 MED ORDER — ALBUTEROL SULFATE (2.5 MG/3ML) 0.083% IN NEBU: 2.5000 mg | INHALATION_SOLUTION | RESPIRATORY_TRACT | Status: DC | PRN

## 2013-12-28 MED ORDER — ASPIRIN 81 MG PO CHEW
81.0000 mg | CHEWABLE_TABLET | Freq: Every day | ORAL | Status: DC
  Administered 2013-12-29 – 2013-12-31 (×3): 81 mg via ORAL
  Filled 2013-12-28 (×3): qty 1

## 2013-12-28 NOTE — Progress Notes (Signed)
ANTICOAGULATION CONSULT NOTE - Follow-up Consult  Pharmacy Consult for heparin  Indication: atrial fibrillation  Allergies  Allergen Reactions  . Amoxicillin Other (See Comments)    REACTION: n/v/d    Patient Measurements: Height: 6\' 4"  (193 cm) Weight: 160 lb 8 oz (72.802 kg) IBW/kg (Calculated) : 86.8 Heparin Dosing Weight: 72kg  Vital Signs: Temp: 98.1 F (36.7 C) (02/01 1322) Temp src: Oral (02/01 1322) BP: 92/60 mmHg (02/01 1322) Pulse Rate: 103 (02/01 1322)  Labs:  Recent Labs  12/27/13 0500 12/28/13 0625 12/28/13 1039 12/28/13 1540 12/28/13 1922  HGB 13.5 12.6* 13.4  --   --   HCT 40.2 37.7* 39.9  --   --   PLT 122* 120* 127*  --   --   LABPROT  --   --   --  14.5  --   INR  --   --   --  1.15  --   HEPARINUNFRC  --   --   --   --  0.26*  CREATININE 0.98 0.81 0.86  --   --   TROPONINI  --   --  0.33* 0.32*  --     Estimated Creatinine Clearance: 69.4 ml/min (by C-G formula based on Cr of 0.86).   Assessment: 80 yo male on heparin for afib. Heparin level 0.26 (subtherapeutic) on 1100 units/hr. CBC stable. No bleeding noted.   Goal of Therapy:  Heparin level 0.3-0.7 units/ml Monitor platelets by anticoagulation protocol: Yes   Plan:  1) Increase heparin drip to 1250 units/hr 2) Daily level and CBC  Sherlon Handing, PharmD, BCPS Clinical pharmacist, pager [phone] 12/28/2013 8:03 PM

## 2013-12-28 NOTE — Evaluation (Signed)
Physical Therapy Evaluation Patient Details Name: [name] MRN: [id number] DOB: 05-28-1930 Today's Date: 12/28/2013 Time: 1145-1200 PT Time Calculation (min): 15 min  PT Assessment / Plan / Recommendation History of Present Illness  Pt admitted with nausea/vomitting, a fib  Clinical Impression  Pt presents at mod I level with transfers, mobility and gait.  Discussed with pt importance of walking for continued cardiovascular health, pt verbalizes understanding.  Pt with no further PT needs identified at this time.    PT Assessment  Patent does not need any further PT services    Follow Up Recommendations  No PT follow up    Does the patient have the potential to tolerate intense rehabilitation      Barriers to Discharge        Equipment Recommendations  None recommended by PT    Recommendations for Other Services     Frequency      Precautions / Restrictions Restrictions Weight Bearing Restrictions: No   Pertinent Vitals/Pain No c/o pain.  HR 90-107bpm during gait training      Mobility  Bed Mobility Overal bed mobility: Modified Independent General bed mobility comments: increased time Transfers Overall transfer level: Modified independent General transfer comment: increased time Ambulation/Gait Ambulation/Gait assistance: Modified independent (Device/Increase time) Ambulation Distance (Feet): 200 Feet Assistive device: None Gait velocity interpretation: <1.8 ft/sec, indicative of risk for recurrent falls General Gait Details: slow cadence, no LOB with head turns, obstacles or direction changes    Exercises     PT Diagnosis:    PT Problem List:   PT Treatment Interventions:       PT Goals(Current goals can be found in the care plan section) Acute Rehab PT Goals PT Goal Formulation: No goals set, d/c therapy  Visit Information  Last PT Received On: 12/28/13 Assistance Needed: +1 History of Present Illness: Pt admitted with nausea/vomitting, a fib        Prior [place] expects to be discharged to:: Assisted living Prior Function Level of Independence: Independent Communication Communication: No difficulties    Cognition  Cognition Arousal/Alertness: Awake/alert Behavior During Therapy: WFL for tasks assessed/performed Overall Cognitive Status: Within Functional Limits for tasks assessed    Extremity/Trunk Assessment Lower Extremity Assessment Lower Extremity Assessment: Generalized weakness Cervical / Trunk Assessment Cervical / Trunk Assessment: Normal   Balance    End of Session PT - End of Session Activity Tolerance: Patient tolerated treatment well Patient left: in bed;with call bell/phone within reach Nurse Communication: Mobility status  GP     [name] 12/28/2013, 12:06 PM

## 2013-12-28 NOTE — Progress Notes (Signed)
ANTICOAGULATION CONSULT NOTE - Initial Consult  Pharmacy Consult for heparin  Indication: atrial fibrillation  Allergies  Allergen Reactions  . Amoxicillin Other (See Comments)    REACTION: n/v/d    Patient Measurements: Height: 6\' 4"  (193 cm) Weight: 160 lb 8 oz (72.802 kg) IBW/kg (Calculated) : 86.8 Heparin Dosing Weight: 72kg  Vital Signs: Temp: 98.1 F (36.7 C) (02/01 0446) Temp src: Oral (02/01 0446) BP: 102/67 mmHg (02/01 0928) Pulse Rate: 112 (02/01 0928)  Labs:  Recent Labs  12/26/13 0500 12/27/13 0500 12/28/13 0625  HGB 13.9 13.5 12.6*  HCT 40.5 40.2 37.7*  PLT 132* 122* 120*  CREATININE 0.91 0.98 0.81    Estimated Creatinine Clearance: 73.6 ml/min (by C-G formula based on Cr of 0.81).   Medical History: Past Medical History  Diagnosis Date  . Mixed hyperlipidemia   . UNSPECIFIED ANEMIA   . CAD     a. 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  [name] Atrial fibrillation   . PREMATURE VENTRICULAR CONTRACTIONS     a. After CABG - was on Coumadin/Multaq for a period of time. Coumadin discontinued 04/2010 after maintaining NSR.  [name] Esophageal reflux   . IBS   . HYPERPLASIA, PROSTATE NOS W/URINARY OBST/LUTS   . LUMBAR RADICULOPATHY, RIGHT   . OSTEOPOROSIS   . INGUINAL HERNIA   . Lichen planus [id number]    [name]  . Diverticulosis 2011    Diverticulitis 2004  . PONV (postoperative nausea and vomiting)     Medications:  Prescriptions prior to admission  Medication Sig Dispense Refill  . aspirin 325 MG tablet Take 325 mg by mouth daily.        . Calcium Carbonate Antacid (TUMS PO) Take 1 tablet by mouth 2 (two) times daily as needed. For calcium       . cholecalciferol (VITAMIN D) 1000 UNITS tablet Take 1,000 Units by mouth every morning.        [name] FIBER PO Take 2 capsules by mouth every morning.        . folic acid (FOLVITE) 1 MG tablet Take 1 tablet (1 mg total) by mouth daily.   30 tablet  6  . metoprolol tartrate (LOPRESSOR) 25 MG tablet Take 25 mg by mouth 2 (two) times daily.      . Multiple Vitamin (MULITIVITAMIN WITH MINERALS) TABS Take 1 tablet by mouth daily.        . rosuvastatin (CRESTOR) 10 MG tablet Take 10 mg by mouth daily.      . [DISCONTINUED] nitroGLYCERIN (NITROSTAT) 0.4 MG SL tablet Place 1 tablet (0.4 mg total) under the tongue every 5 (five) minutes as needed for chest pain.  30 tablet  3   Scheduled:  . albuterol  2.5 mg Nebulization TID  . aspirin  325 mg Oral Daily  . atorvastatin  20 mg Oral q1800  . enoxaparin (LOVENOX) injection  40 mg Subcutaneous Q24H  . metoprolol tartrate  50 mg Oral BID  . pantoprazole (PROTONIX) IV  40 mg Intravenous Q24H  . sodium chloride  3 mL Intravenous Q12H   Infusions:    Assessment: 80 yo who was admitted for suspected gastroenteritis. He was found to be in afib this AM. IV heparin has been ordered for anticoagulation. Baseline CBC ok. He has been on lovenox and dose has not been given today. We'll dc it.   Goal of Therapy:  Heparin level  0.3-0.7 units/ml Monitor platelets by anticoagulation protocol: Yes   Plan:   Heparin bolus 2000 units x1 Heparin drip at 1100 units/hr Check 8hr heparin level Daily level and CBC

## 2013-12-28 NOTE — Progress Notes (Signed)
Utilization review completed.  

## 2013-12-28 NOTE — Progress Notes (Addendum)
PATIENT DETAILS Name: [name] Age: 80 y.o. Sex: male Date of Birth: 1929/12/07 Admit Date: 12/26/2013 Admitting Physician Cristal Ford, DO PZW:CHENIDP Linna Darner, MD  Subjective: Nausea has resolved, however developed A. fib with RVR last night  Assessment/Plan: Principal Problem:   Gastroenteritis - patient admitted with nausea and vomiting, suspected viral gastroenteritis. He was provided with supportive care. This has resolved. Would advance to a regular diet.  Atrial fibrillation with rapid ventricular response - Patient has a known history of having paroxysmal A. fib post CABG in 2011. He apparently was on Multaq, digoxin and metoprolol. During that time he was also on Coumadin for a few months. He was not on any anticoagulation prior to this admission,claims coumadin was discontinued in 2011. - On admission, patient was in sinus rhythm, however around 2:00 this morning he was noted to be in A. fib with RVR. - We have increased his Lopressor to 50 mg twice a day, however his blood pressure is soft and suspect would not tolerate further increase in metoprolol. Reviewed outpatient cardiology notes, apparently he had difficult to control atrial fibrillation in 2011 and required multivitamin digoxin. - Spoke with cardiology-Dr. Domenic Polite, will start on IV heparin, and await cardiology recommendations. Cautiously continue with Lopressor for now  - Check echo,check troponin  Mixed hyperlipidemia  - Stable, continue with statins   CAD  - Stable, continue with aspirin, metoprolol and statins.   Esophageal reflux  - Prescribed PPI on discharge.   Dehydration  - Resolved with IV fluids.   Disposition: Remain inpatient  DVT Prophylaxis:  not needed-on heparin   Code Status: Full code   Family Communication None at bedside   Procedures:   none  CONSULTS:  cardiology  Time spent 40 minutes-which includes 50% of the time with face-to-face with patient/ family  and coordinating care related to the above assessment and plan.    MEDICATIONS: Scheduled Meds: . albuterol  2.5 mg Nebulization TID  . aspirin  325 mg Oral Daily  . atorvastatin  20 mg Oral q1800  . heparin  2,000 Units Intravenous Once  . metoprolol tartrate  50 mg Oral BID  . pantoprazole (PROTONIX) IV  40 mg Intravenous Q24H  . sodium chloride  3 mL Intravenous Q12H   Continuous Infusions: . heparin     PRN Meds:.acetaminophen, acetaminophen, HYDROcodone-acetaminophen, morphine injection, nitroGLYCERIN, ondansetron (ZOFRAN) IV, ondansetron, promethazine  Antibiotics: Anti-infectives   None       PHYSICAL EXAM: Vital signs in last 24 hours: Filed Vitals:   12/27/13 2205 12/28/13 0446 12/28/13 0756 12/28/13 0928  BP: 117/66 117/78  102/67  Pulse: 85 110  112  Temp: 99.3 F (37.4 C) 98.1 F (36.7 C)    TempSrc: Oral Oral    Resp: 18 18    Height:      Weight:      SpO2: 92% 94% 94%     Weight change:  Filed Weights   12/26/13 0454 12/26/13 0922  Weight: 76.204 kg (168 lb) 72.802 kg (160 lb 8 oz)   Body mass index is 19.54 kg/(m^2).   Gen Exam: Awake and alert with clear speech.   Neck: Supple, No JVD.   Chest: B/L Clear.   CVS: S1 S2 irregularly irregular. Tachycardic.  Abdomen: soft, BS +, non tender, non distended.  Extremities: no edema, lower extremities warm to touch. Neurologic: Non Focal.   Skin: No Rash.   Wounds: N/A.    Intake/Output from  previous day:  Intake/Output Summary (Last 24 hours) at 12/28/13 1104 Last data filed at 12/28/13 1000  Gross per 24 hour  Intake    690 ml  Output    475 ml  Net    215 ml     LAB RESULTS: CBC  Recent Labs Lab 12/26/13 0500 12/27/13 0500 12/28/13 0625  WBC 11.7* 12.5* 11.8*  HGB 13.9 13.5 12.6*  HCT 40.5 40.2 37.7*  PLT 132* 122* 120*  MCV 92.9 95.5 95.0  MCH 31.9 32.1 31.7  MCHC 34.3 33.6 33.4  RDW 15.5 16.1* 16.2*  LYMPHSABS 0.9  --   --   MONOABS 0.9  --   --   EOSABS 0.1  --   --    BASOSABS 0.0  --   --     Chemistries   Recent Labs Lab 12/26/13 0500 12/26/13 2018 12/27/13 0500 12/28/13 0625  NA 139  --  143 144  K 4.3  --  4.8 3.8  CL 100  --  105 106  CO2 25  --  24 24  GLUCOSE 156*  --  120* 142*  BUN 21  --  22 24*  CREATININE 0.91  --  0.98 0.81  CALCIUM 8.8  --  8.8 8.3*  MG  --  2.0  --   --     CBG: No results found for this basename: GLUCAP,  in the last 168 hours  GFR Estimated Creatinine Clearance: 73.6 ml/min (by C-G formula based on Cr of 0.81).  Coagulation profile No results found for this basename: INR, PROTIME,  in the last 168 hours  Cardiac Enzymes No results found for this basename: CK, CKMB, TROPONINI, MYOGLOBIN,  in the last 168 hours  No components found with this basename: POCBNP,  No results found for this basename: DDIMER,  in the last 72 hours No results found for this basename: HGBA1C,  in the last 72 hours No results found for this basename: CHOL, HDL, LDLCALC, TRIG, CHOLHDL, LDLDIRECT,  in the last 72 hours  Recent Labs  12/27/13 0500  TSH 0.576   No results found for this basename: VITAMINB12, FOLATE, FERRITIN, TIBC, IRON, RETICCTPCT,  in the last 72 hours  Recent Labs  12/26/13 0500  LIPASE 36    Urine Studies No results found for this basename: UACOL, UAPR, USPG, UPH, UTP, UGL, UKET, UBIL, UHGB, UNIT, UROB, ULEU, UEPI, UWBC, URBC, UBAC, CAST, CRYS, UCOM, BILUA,  in the last 72 hours  MICROBIOLOGY: Recent Results (from the past 240 hour(s))  MRSA PCR SCREENING     Status: None   Collection Time    12/26/13  9:57 AM      Result Value Range Status   MRSA by PCR NEGATIVE  NEGATIVE Final   Comment:            The GeneXpert MRSA Assay (FDA     approved for NASAL specimens     only), is one component of a     comprehensive MRSA colonization     surveillance program. It is not     intended to diagnose MRSA     infection nor to guide or     monitor treatment for     MRSA infections.  CLOSTRIDIUM  DIFFICILE BY PCR     Status: None   Collection Time    12/26/13  3:25 PM      Result Value Range Status   C difficile by pcr NEGATIVE  NEGATIVE Final  RADIOLOGY STUDIES/RESULTS: No results found.  Oren Binet, MD  [facility] Pager:[phone]  If 7PM-7AM, please contact night-coverage [email or web address] Password [email or web address] 12/28/2013, 11:04 AM   LOS: 2 days

## 2013-12-28 NOTE — Consult Note (Signed)
Cardiology Consultation Note  Patient ID: [name], MRN: [id number], DOB/AGE: 1930-07-10 80 y.o. Admit date: 12/26/2013   Date of Consult: 12/28/2013 Primary Physician: Unice Cobble, MD Primary Cardiologist: Johnsie Cancel  Chief Complaint: nausea, vomiting Reason for Consult: recurrence of AF  HPI: [name] is an 80 y/o M with history of CAD s/p CABGx3 in [id number] complicated by post-op atrial fibrillation, hyperlipidemia, GERD who presented to [facility] on 12/26/13 with abupt onset of 12 hours of nausea and vomiting (non-bloody "phlegm") along with some abdominal pain. He did not have any fever or diarrhea. He did have a mild leukocytosis. This was felt to represent a viral gastroenteritis. He lives in an ALF and a resident next door had a URI but no recent contacts with GI illnesses. He was admitted and given supportive care with IV fluids and antiemetics. He was due to be discharged yesterday but had persistent nausea so was kept for observation. His GI symptoms have resolved. Around 2am, he was noted to go into atrial fibrillation. He does endorse an awareness of palpitations and denies any recent feeling of these outside the hospital. Denies chest pain or SOB - he just finished a walk around the unit when I came to interview him and denied anginal symptoms. While ambulating, HR was 110-120. His metoprolol was increased from 25 to 50mg  just over an hour ago. He does have occasional orthonpea. No LEE, weight change. He has history of AF after CABG treated with Multaq but had converted to NSR and Coumadin was discontinued in 04/2010. He denies any history of stroke/ministroke, falls, or prior/current bleeding issues.  Labwork reveals Hgb 12.6, thrombocytopenia 120 but has been getting fluids. Initial troponin neg x 1, lipase/LFTs/lactic acid wnl. TSH wnl. C diff negative and FOBT neg.  Past Medical History  Diagnosis Date  . Mixed hyperlipidemia   . UNSPECIFIED ANEMIA   . CAD     a. 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  [name] Atrial fibrillation   . PREMATURE VENTRICULAR CONTRACTIONS     a. After CABG - was on Coumadin/Multaq for a period of time. Coumadin discontinued 04/2010 after maintaining NSR.  [name] Esophageal reflux   . IBS   . HYPERPLASIA, PROSTATE NOS W/URINARY OBST/LUTS   . LUMBAR RADICULOPATHY, RIGHT   . OSTEOPOROSIS   . INGUINAL HERNIA   . Lichen planus [id number]    [name]  . Diverticulosis 2011    Diverticulitis 2004  . PONV (postoperative nausea and vomiting)       Most Recent Cardiac Studies: 2D Echo 01/2010 Study Conclusions - Left ventricle: The cavity size was normal. Systolic function was normal. The estimated ejection fraction was in the range of 60% to 65%. Wall motion was normal; there were no regional wall motion abnormalities. Features are consistent with a pseudonormal left ventricular filling pattern, with concomitant abnormal relaxation and increased filling pressure (grade 2 diastolic dysfunction). - Aortic valve: Trivial regurgitation. - Left atrium: The atrium was mildly dilated. - Right atrium: The atrium was moderately to severely dilated. - Pulmonary arteries: PA peak pressure: 31mm Hg (S).  Cardiac Cath 01/2010 prior to CABG INDICATIONS:  Mr. Dinardi is a delightful 80 year old gentleman who underwent diagnostic catheterization by Dr. Johnsie Cancel.  It was quite a difficult catheterization and they were unable to cannulate the right coronary artery.  He has a fairly heavily calcified left anterior descending artery and  Dr. Johnsie Cancel thought that the patient would be improved by percutaneous intervention of this vessel.  Prior to the procedure, we discussed the options with the patient.  We clearly need to see the right coronary artery.  Recommendation was for re-cath with possibility of percutaneous intervention depending upon the findings in the RCA.  The patient was given  clopidogrel on his admission to the hospital.  He was brought to the lab today for further evaluation after informed consent.  PROCEDURE: 1. Selective coronary arteriography. 2. Left ventriculography.  DESCRIPTION OF PROCEDURE:  The patient was brought to the Catheterization Laboratory after informed consent, prepped and draped in usual fashion.  We used a left femoral artery and a 7-French sheath was placed in anticipation of some difficulty cannulating the RCA.  Multiple catheters were used including left coronary artery bypass graft, an AL-1 guide catheter and a left coronary artery bypass guide catheter.  An AL- 2 was also utilized.  We were ultimately able to get good visualization using an XB, but it was obvious that the ostium of the right coronary artery came off in the direction opposite that of the catheter direction, specifically not far from the left coronary ostium.  I then reviewed the films carefully with my colleagues.  I talked with the patient.  We elected to recommend that consideration be given for the possibility of revascularization surgery given the findings.  It was felt that the right coronary artery had high-grade disease, and then it was not going to be easy to do a percutaneous intervention combined with high-grade calcification in the LAD.  It was felt that a surgical approach would be optimal for relief of his symptoms.  He was agreeable to discussing this with the surgeons, the femoral sheath was sewn into place appropriately and he was taken to the holding area in satisfactory clinical condition.  ACT was appropriate for percutaneous intervention after the administration of bivalirudin.  There were no major complications.  HEMODYNAMIC DATA: 1. Central aortic pressure 140/62, mean 92. 2. LV 125/90. 3. No gradient on pullback across the aortic valve.  ANGIOGRAPHIC DATA: 1. Ventriculography in the RAO projection reveals what appears to be      well-preserved LV function.  There is some calcification in the     coronaries and perhaps some calcification in the aortic leaflet,     but the aortic leaflets appear to open well. 2. The right coronary artery is a vessel that comes off way     anteriorly.  It likely emerges from the left coronary ostium, in a     direction of opposite to that reached by the guiding catheter.  The     RCA does demonstrate some proximal irregularity, but without high-     grade narrowing.  The vessel then opens up and then there is a high-     grade bifurcational stenosis at the takeoff of the PDA and     posterolateral vessel.  These are modest vessels in size, and yet     not important. CONCLUSION: 1. Preserved left ventricular function. 2. Marked anterior takeoff of the right coronary artery from what     appears to be the left coronary cusp, and high-grade bifurcational     disease at the PDA, PLA continuation takeoff. 3. Known high-grade LAD disease and extensively calcified segment of     vessel. DISPOSITION:  At the present time, we have called a surgical consult to review the films and discuss the options  before we undertake any type of additional procedure.  The patient had been pretreated with clopidogrel, and will need to await a reasonable period of time for surgery.   Surgical History:  Past Surgical History  Procedure Laterality Date  . Hip fracture surgery      Dr.Dahldorff  . Knee surgery      Right  . Hip fracture surgery  2009  . Inguinal hernia repair  2012    Dr Brantley Stage  . Coronary artery bypass graft      2 VD;anomalous RCA;post op compliacted by AF  . Colonoscopy  2011    Diverticulosis; Dr Deatra Ina  . Prostate biopsy  09/19/2013    [facility]  . Cataract extraction  10/06/2013  . Fracture surgery      hip repair in 2009, knee repair in [place]: Prior to Admission medications   Medication Sig Start Date End Date Taking? Authorizing Provider  aspirin  325 MG tablet Take 325 mg by mouth daily.     Yes Historical Provider, MD  Calcium Carbonate Antacid (TUMS PO) Take 1 tablet by mouth 2 (two) times daily as needed. For calcium    Yes Historical Provider, MD  cholecalciferol (VITAMIN D) 1000 UNITS tablet Take 1,000 Units by mouth every morning.     Yes Historical Provider, MD  FIBER PO Take 2 capsules by mouth every morning.     Yes Historical Provider, MD  folic acid (FOLVITE) 1 MG tablet Take 1 tablet (1 mg total) by mouth daily. 06/19/13  Yes Josue Hector, MD  metoprolol tartrate (LOPRESSOR) 25 MG tablet Take 25 mg by mouth 2 (two) times daily.   Yes Historical Provider, MD  Multiple Vitamin (MULITIVITAMIN WITH MINERALS) TABS Take 1 tablet by mouth daily.     Yes Historical Provider, MD  rosuvastatin (CRESTOR) 10 MG tablet Take 10 mg by mouth daily.   Yes Historical Provider, MD  nitroGLYCERIN (NITROSTAT) 0.4 MG SL tablet Place 1 tablet (0.4 mg total) under the tongue every 5 (five) minutes as needed for chest pain. 12/27/13 05/24/15  Shanker Kristeen Mans, MD  ondansetron (ZOFRAN) 4 MG tablet Take 1 tablet (4 mg total) by mouth every 6 (six) hours as needed for nausea or vomiting. 12/27/13   Jonetta Osgood, MD  pantoprazole (PROTONIX) 40 MG tablet Take 1 tablet (40 mg total) by mouth daily. 12/27/13   Shanker Kristeen Mans, MD    Inpatient Medications:  . albuterol  2.5 mg Nebulization TID  . aspirin  325 mg Oral Daily  . atorvastatin  20 mg Oral q1800  . heparin  2,000 Units Intravenous Once  . metoprolol tartrate  50 mg Oral BID  . pantoprazole (PROTONIX) IV  40 mg Intravenous Q24H  . sodium chloride  3 mL Intravenous Q12H   . heparin      Allergies:  Allergies  Allergen Reactions  . Amoxicillin Other (See Comments)    REACTION: n/v/d    History   Social History  . Marital Status: Married    Spouse Name: N/A    Number of Children: N/A  . Years of Education: N/A   Occupational History  . Not on file.   Social History Main  Topics  . Smoking status: Former Smoker    Quit date: 11/27/1962  . Smokeless tobacco: Never Used  . Alcohol Use: No     Comment: 2 oz wine/ night  . Drug Use: No  . Sexual Activity: Not  Currently   Other Topics Concern  . Not on file   Social History Narrative  . No narrative on file     Family History  Problem Relation Age of Onset  . Breast cancer Sister   . Stroke Mother   . Heart disease Father   . Heart disease Sister 42     Review of Systems: General: negative for chills, fever Cardiovascular: see above Dermatological: negative for rash Respiratory: negative for cough or wheezing Urologic: negative for hematuria Abdominal: negative for bright red blood per rectum, melena, or hematemesis. 1 loose stool yesterday Neurologic: negative for visual changes, syncope, or dizziness All other systems reviewed and are otherwise negative except as noted above.  Labs: Troponin neg x 1  Lab Results  Component Value Date   WBC 12.7* 12/28/2013   HGB 13.4 12/28/2013   HCT 39.9 12/28/2013   MCV 96.1 12/28/2013   PLT 127* 12/28/2013    Recent Labs Lab 12/26/13 0500  12/28/13 0625  NA 139  < > 144  K 4.3  < > 3.8  CL 100  < > 106  CO2 25  < > 24  BUN 21  < > 24*  CREATININE 0.91  < > 0.81  CALCIUM 8.8  < > 8.3*  PROT 6.8  --   --   BILITOT 0.6  --   --   ALKPHOS 51  --   --   ALT 14  --   --   AST 20  --   --   GLUCOSE 156*  < > 142*  < > = values in this interval not displayed. Lab Results  Component Value Date   CHOL 131 05/20/2010   HDL 61.70 05/20/2010   LDLCALC 46 05/20/2010   TRIG 117.0 05/20/2010   Radiology/Studies:  No results found.  EKG: Admit ECG: NSR 75bpm with PACs, RSR V1, possible mild inferior ST depression Today: atrial fib 119 inferolateral ST depression/TW change RSR is old.  Physical Exam: Blood pressure 102/67, pulse 112, temperature 98.1 F (36.7 C), temperature source Oral, resp. rate 18, height 6\' 4"  (1.93 m), weight 160 lb 8 oz (72.802  kg), SpO2 94.00%. General: Well developed tall thin elderly WM in no acute distress. Head: Normocephalic, atraumatic, sclera non-icteric, no xanthomas, nares are without discharge.  Neck: Negative for carotid bruits. JVD not elevated. Lungs: Slightly diminished BS R lung base, otherwise clear bilaterally to auscultation without wheezes, rales, or rhonchi. Breathing is unlabored. Heart: Irr irr, slightly elevated rate with S1 S2. No murmurs, rubs, or gallops appreciated. Abdomen: Soft, non-tender, non-distended with normoactive bowel sounds. No hepatomegaly. No rebound/guarding. No obvious abdominal masses. Msk:  Strength and tone appear normal for age. Extremities: No clubbing or cyanosis. No edema.  Distal pedal pulses are 2+ and equal bilaterally. Neuro: Alert and oriented X 3. No facial asymmetry. No focal deficit. Moves all extremities spontaneously. Psych:  Responds to questions appropriately with a normal affect.   Assessment and Plan:   1. Viral gastroenteritis 2. First recurrence of atrial fibrillation since CABG  3. Sinus bradycardia at baseline 4. CAD s/p CABG 01/2010 5. GERD 6. Mild thrombocytopenia without reported bleeding 7. Hyperglycemia  Agree with increase in metoprolol. Would continue telemetry because he has baseline sinus bradycardia thus will need to be careful about titrating rate control. CHADSVASC2 = at least 3 for age & vascular disease (possibly 4 if hyperglycemia represents new DM) so he would benefit from systemic anticoagulation given risk of stroke. Would  start with IV heparin and consult care management for cost feasibility of NOACs vs Coumadin as I do not see a contraindication to the newer agents. Check A1C and update 2D echocardiogram to complete CHADSVASC assessment. If EF is down, may need further ischemic evaluation given EKG changes before committing to oral anticoagulation although note he has had no recent anginal symptoms. Check baseline CXR to exclude  any cardiopulm process contributing. Decrease ASA to 81mg  daily and continue statin. Will make NPO after midnight for consideration of cardioversion tomorrow with monitoring for subsequent bradycardia.  Signed, Melina Copa PA-C 12/28/2013, 11:19 AM   Attending note:  Patient seen and examined. Reviewed records and discussed case with Ms. Purcell Mouton. Patient currently admitted to the hospital with viral gastroenteritis, discharge was delayed yesterday due to recurrent nausea and emesis. He was noted to go into atrial fibrillation on telemetry overnight. Cardiac history includes CAD status post CABG in 2011 with postoperative atrial fibrillation at that time managed with Multaq and Coumadin. He was ultimately taken off both medications and has done well since that time. Last saw Dr. Johnsie Cancel in September 2014.  Beta blocker dose has been increased and heart rate in atrial fibrillation is now in the 90s at rest. He reports a vague sense of palpitations, no chest pain. ECG confirms atrial fibrillation with some ST segment depression noted in RVR. At baseline he is bradycardic in sinus rhythm.  CHADSVASC score is 3-4. Plan at this time is to initiate intravenous heparin, continue beta blocker. If he does not convert spontaneously with rate control, can be considered for DCCV electively tomorrow. After that, would suggest oral anti-coagulation, perhaps Eliquis 5 mg twice daily. Also repeat echocardiogram to reassess cardiac structure and function. At this point, not clear that further ischemic workup is needed. He will need to have followup with Dr. Johnsie Cancel after discharge.  Satira Sark, M.D., F.A.C.C.

## 2013-12-28 NOTE — Progress Notes (Signed)
CRITICAL VALUE ALERT  Critical value received:  Troponin 0.33  Date of notification:  12/28/2013  Time of notification:  7408  Critical value read back:yes  Nurse who received alert:  Merrily Pew  MD notified (1st page):  Dr. Sloan Leiter  Time of first page:  1341  MD notified (2nd page):  Time of second page:  Responding MD:  Dr. Sloan Leiter   Time MD responded:  [phone]

## 2013-12-29 ENCOUNTER — Inpatient Hospital Stay (HOSPITAL_COMMUNITY): Payer: Medicare Other

## 2013-12-29 DIAGNOSIS — I059 Rheumatic mitral valve disease, unspecified: Secondary | ICD-10-CM

## 2013-12-29 LAB — URINE MICROSCOPIC-ADD ON

## 2013-12-29 LAB — CBC
HEMATOCRIT: 38.6 % — AB (ref 39.0–52.0)
Hemoglobin: 13 g/dL (ref 13.0–17.0)
MCH: 32 pg (ref 26.0–34.0)
MCHC: 33.7 g/dL (ref 30.0–36.0)
MCV: 95.1 fL (ref 78.0–100.0)
PLATELETS: 148 10*3/uL — AB (ref 150–400)
RBC: 4.06 MIL/uL — ABNORMAL LOW (ref 4.22–5.81)
RDW: 16.5 % — AB (ref 11.5–15.5)
WBC: 12.1 10*3/uL — ABNORMAL HIGH (ref 4.0–10.5)

## 2013-12-29 LAB — BASIC METABOLIC PANEL
BUN: 26 mg/dL — AB (ref 6–23)
CALCIUM: 8.7 mg/dL (ref 8.4–10.5)
CO2: 25 mEq/L (ref 19–32)
Chloride: 107 mEq/L (ref 96–112)
Creatinine, Ser: 0.76 mg/dL (ref 0.50–1.35)
GFR calc non Af Amer: 83 mL/min — ABNORMAL LOW (ref >90)
Glucose, Bld: 127 mg/dL — ABNORMAL HIGH (ref 70–99)
Potassium: 3.9 mEq/L (ref 3.7–5.3)
Sodium: 146 mEq/L (ref 137–147)

## 2013-12-29 LAB — URINALYSIS, ROUTINE W REFLEX MICROSCOPIC
Glucose, UA: NEGATIVE mg/dL
Ketones, ur: 40 mg/dL — AB
LEUKOCYTES UA: NEGATIVE
NITRITE: NEGATIVE
PH: 5.5 (ref 5.0–8.0)
PROTEIN: 100 mg/dL — AB
Specific Gravity, Urine: 1.03 (ref 1.005–1.030)
Urobilinogen, UA: 1 mg/dL (ref 0.0–1.0)

## 2013-12-29 LAB — HEPARIN LEVEL (UNFRACTIONATED): Heparin Unfractionated: 0.34 IU/mL (ref 0.30–0.70)

## 2013-12-29 MED ORDER — POTASSIUM CHLORIDE CRYS ER 20 MEQ PO TBCR
40.0000 meq | EXTENDED_RELEASE_TABLET | Freq: Once | ORAL | Status: AC
  Administered 2013-12-29: 40 meq via ORAL
  Filled 2013-12-29 (×2): qty 2

## 2013-12-29 MED ORDER — AZITHROMYCIN 500 MG PO TABS
500.0000 mg | ORAL_TABLET | Freq: Every day | ORAL | Status: AC
  Administered 2013-12-29 – 2013-12-31 (×3): 500 mg via ORAL
  Filled 2013-12-29 (×3): qty 1

## 2013-12-29 MED ORDER — FUROSEMIDE 10 MG/ML IJ SOLN
40.0000 mg | Freq: Once | INTRAMUSCULAR | Status: AC
  Administered 2013-12-29: 40 mg via INTRAVENOUS
  Filled 2013-12-29: qty 4

## 2013-12-29 MED ORDER — APIXABAN 5 MG PO TABS
5.0000 mg | ORAL_TABLET | Freq: Two times a day (BID) | ORAL | Status: DC
  Administered 2013-12-29 – 2013-12-31 (×5): 5 mg via ORAL
  Filled 2013-12-29 (×6): qty 1

## 2013-12-29 MED ORDER — PROMETHAZINE HCL 25 MG/ML IJ SOLN
12.5000 mg | Freq: Four times a day (QID) | INTRAMUSCULAR | Status: DC | PRN
  Administered 2013-12-29: 12.5 mg via INTRAVENOUS
  Filled 2013-12-29: qty 1

## 2013-12-29 NOTE — Progress Notes (Signed)
Patient ID: [name], male   DOB: March 18, 1930, 80 y.o.   MRN: [id number]    Subjective:  Denies SSCP, palpitations or Dyspnea Stomach feels a bit better  Low grade temp   Objective:  Filed Vitals:   12/28/13 0928 12/28/13 1322 12/28/13 2233 12/29/13 0517  BP: 102/67 92/60 140/86 120/79  Pulse: 112 103 108 98  Temp:  98.1 F (36.7 C) 98.9 F (37.2 C) 99.3 F (37.4 C)  TempSrc:  Oral Oral Oral  Resp:  18 18 18   Height:      Weight:      SpO2:  92% 94% 95%    Intake/Output from previous day:  Intake/Output Summary (Last 24 hours) at 12/29/13 2671 Last data filed at 12/29/13 0556  Gross per 24 hour  Intake    240 ml  Output    475 ml  Net   -235 ml    Physical Exam: Affect appropriate Healthy:  appears stated age HEENT: normal Neck supple with no adenopathy JVP normal no bruits no thyromegaly Lungs clear with no wheezing and good diaphragmatic motion Heart:  S1/S2 no murmur, no rub, gallop or click PMI normal Abdomen: benighn, BS positve, no tenderness, no AAA no bruit.  No HSM or HJR Distal pulses intact with no bruits No edema Neuro non-focal Skin warm and dry No muscular weakness   Lab Results: Basic Metabolic Panel:  Recent Labs  12/26/13 2018  12/28/13 0625 12/28/13 1039  NA  --   < > 144 144  K  --   < > 3.8 4.1  CL  --   < > 106 105  CO2  --   < > 24 26  GLUCOSE  --   < > 142* 157*  BUN  --   < > 24* 23  CREATININE  --   < > 0.81 0.86  CALCIUM  --   < > 8.3* 8.8  MG 2.0  --   --   --   < > = values in this interval not displayed. CBC:  Recent Labs  12/28/13 0625 12/28/13 1039  WBC 11.8* 12.7*  HGB 12.6* 13.4  HCT 37.7* 39.9  MCV 95.0 96.1  PLT 120* 127*   Cardiac Enzymes:  Recent Labs  12/28/13 1039 12/28/13 1540 12/28/13 2255  TROPONINI 0.33* 0.32* 0.31*   Hemoglobin A1C:  Recent Labs  12/28/13 1540  HGBA1C 6.0*   Thyroid Function Tests:  Recent Labs  12/27/13 0500  TSH 0.576    Imaging: Dg Chest 2  View  12/28/2013   CLINICAL DATA:  AFib, cough, congestion  EXAM: CHEST  2 VIEW  COMPARISON:  DG CHEST 2 VIEW dated 12/04/2011  FINDINGS: There is bilateral chronic interstitial thickening. There is no focal parenchymal opacity, pleural effusion, or pneumothorax. The heart and mediastinal contours are unremarkable. Prior CABG.  There is degenerative disc disease throughout the thoracic spine.  IMPRESSION: Bilateral chronic interstitial thickening. Mild superimposed interstitial edema or atypical infection cannot be excluded.   Electronically Signed   By: Kathreen Devoid   On: 12/28/2013 18:36    Cardiac Studies:  ECG:  Afib nonspecific ST/T wave changes    Telemetry: afib rate 90's   Echo:  Pending   Medications:   . aspirin  81 mg Oral Daily  . atorvastatin  20 mg Oral q1800  . metoprolol tartrate  50 mg Oral BID  . pantoprazole (PROTONIX) IV  40 mg Intravenous Q24H  . sodium chloride  3  mL Intravenous Q12H     . heparin 1,250 Units/hr (12/29/13 0352)    Assessment/Plan:  Afib:  Essentially asymptomatic  Has had history of hard to control PAF  Previously on Multaq.  Previously on coumadin but Hard to control  No bleeding issues.  Continue current dose of lopressor for rate control  Start eliquis 5 bid.  Outpatient f/u With me 4 weeks and consider starting Multaq and [facility] if still in afib.  Advance diet as tolerated and ? D/c in 24-48 hours   Jenkins Rouge 12/29/2013, 8:28 AM

## 2013-12-29 NOTE — Care Management Note (Signed)
    Page 1 of 2   12/31/2013     5:02:01 PM   CARE MANAGEMENT NOTE 12/31/2013  Patient:  [name], [name]   Account Number:  [id number]  Date Initiated:  12/29/2013  Documentation initiated by:  Tomi Bamberger  Subjective/Objective Assessment:   dx afib, fever,  admit- from indep living.     Action/Plan:   pt eval- no pt f/u needed.  2/4- pt/ot re-eval   Anticipated DC Date:  12/31/2013   Anticipated DC Plan:  [place]  CM consult      [facility] Choice  HOME HEALTH   Choice offered to / List presented to:  C-1 Patient        [place] arranged  [place] [facility]      [place].   Status of service:  Completed, signed off Medicare Important Message given?   (If response is "NO", the following Medicare IM given date fields will be blank) Date Medicare IM given:   Date Additional Medicare IM given:    Discharge Disposition:  [place]  Per UR Regulation:  Reviewed for med. necessity/level of care/duration of stay  If discussed at [place] of Stay Meetings, dates discussed:    Comments:  12/31/13 14:28 Tomi Bamberger RN, BSN [phone] pt to re-eval patient, patient nausious today and not eating, ivf started , patient's heart rate goes up in 100's during ambulation.  Plan for dc today, MD states patient wants hhpt, NCM spoke with patient , he would like to work with [facility], referral made to [facility], Butch Penny notified.  Soc will begin 24-48 hrs post discharge.  12/30/13 16:12 Tomi Bamberger RN, BSN [phone] NCM received prior auth for Eliquis and it is good thru 12/30/14, and co pay will be $45 for patient.  NCM informed patient. MD will need to write 30 day script to go with 30 day free trial of Eliquis.  12/29/13 16:01 Tomi Bamberger RN, BSN [phone] patient from indep living facility, per physical therapy patient has no pt f/u needs.  NCM gave patient Eliquis 30 day free tial card, also checking patient's co pmt on eliquis.   NCM will let patient know once co pmt information received.

## 2013-12-29 NOTE — Discharge Instructions (Signed)
Information on my medicine - ELIQUIS® (apixaban) ° °This medication education was reviewed with me or my healthcare representative as part of my discharge preparation.  The pharmacist that spoke with me during my hospital stay was:  Nivek Powley P, RPH ° °Why was Eliquis® prescribed for you? °Eliquis® was prescribed for you to reduce the risk of a blood clot forming that can cause a stroke if you have a medical condition called atrial fibrillation (a type of irregular heartbeat). ° °What do You need to know about Eliquis® ? °Take your Eliquis® TWICE DAILY - one tablet in the morning and one tablet in the evening with or without food. If you have difficulty swallowing the tablet whole please discuss with your pharmacist how to take the medication safely. ° °Take Eliquis® exactly as prescribed by your doctor and DO NOT stop taking Eliquis® without talking to the doctor who prescribed the medication.  Stopping may increase your risk of developing a stroke.  Refill your prescription before you run out. ° °After discharge, you should have regular check-up appointments with your healthcare provider that is prescribing your Eliquis®.  In the future your dose may need to be changed if your kidney function or weight changes by a significant amount or as you get older. ° °What do you do if you miss a dose? °If you miss a dose, take it as soon as you remember on the same day and resume taking twice daily.  Do not take more than one dose of ELIQUIS at the same time to make up a missed dose. ° °Important Safety Information °A possible side effect of Eliquis® is bleeding. You should call your healthcare provider right away if you experience any of the following: °  Bleeding from an injury or your nose that does not stop. °  Unusual colored urine (red or dark brown) or unusual colored stools (red or black). °  Unusual bruising for unknown reasons. °  A serious fall or if you hit your head (even if there is no bleeding). ° °Some  medicines may interact with Eliquis® and might increase your risk of bleeding or clotting while on Eliquis®. To help avoid this, consult your healthcare provider or pharmacist prior to using any new prescription or non-prescription medications, including herbals, vitamins, non-steroidal anti-inflammatory drugs (NSAIDs) and supplements. ° °This website has more information on Eliquis® (apixaban): www.Eliquis.com. °

## 2013-12-29 NOTE — Progress Notes (Signed)
PATIENT DETAILS Name: [name] Age: 80 y.o. Sex: male Date of Birth: 10-Aug-1930 Admit Date: 12/26/2013 Admitting Physician Cristal Ford, DO ZOX:[id number] Linna Darner, MD  Subjective: Coughing, nausea better.  Assessment/Plan: Principal Problem:   Gastroenteritis - patient admitted with nausea and vomiting, suspected viral gastroenteritis. He was provided with supportive care. This has resolved. Will advance to a regular diet.  Atrial fibrillation with rapid ventricular response - Patient has a known history of having paroxysmal A. fib post CABG in 2011. He apparently was on Multaq, digoxin and metoprolol. During that time he was also on Coumadin for a few months. He was not on any anticoagulation prior to this admission,claims coumadin was discontinued in 2011. - On admission, patient was in sinus rhythm, however around 2:00 am in 2/1 morning he was noted to be in A. fib with RVR. - Rate is significantly better, cardiology has followed him up today, heparin infusion has been discontinued, patient is now on adequate.  Cough-?Atypical PNA - Bringing up a lot of green colored phlegm. - Given increased BNP, will treat with one dose of Lasix and 3 day course of Zithromax. - Incentive spirometry  Mixed hyperlipidemia  - Stable, continue with statins   CAD  - Stable, continue with aspirin, metoprolol and statins.   Esophageal reflux  - Prescribed PPI on discharge.   Dehydration  - Resolved with IV fluids.   Disposition: Remain inpatient  DVT Prophylaxis: On Eliquis  Code Status: Full code   Family Communication None at bedside   Procedures:   none  CONSULTS:  cardiology   MEDICATIONS: Scheduled Meds: . apixaban  5 mg Oral BID  . aspirin  81 mg Oral Daily  . atorvastatin  20 mg Oral q1800  . furosemide  40 mg Intravenous Once  . metoprolol tartrate  50 mg Oral BID  . pantoprazole (PROTONIX) IV  40 mg Intravenous Q24H  . potassium chloride  40 mEq Oral  Once  . sodium chloride  3 mL Intravenous Q12H   Continuous Infusions:   PRN Meds:.acetaminophen, acetaminophen, albuterol, HYDROcodone-acetaminophen, morphine injection, nitroGLYCERIN, ondansetron (ZOFRAN) IV, ondansetron, promethazine  Antibiotics: Anti-infectives   None       PHYSICAL EXAM: Vital signs in last 24 hours: Filed Vitals:   12/28/13 1322 12/28/13 2233 12/29/13 0517 12/29/13 1143  BP: 92/60 140/86 120/79 111/77  Pulse: 103 108 98 98  Temp: 98.1 F (36.7 C) 98.9 F (37.2 C) 99.3 F (37.4 C)   TempSrc: Oral Oral Oral   Resp: 18 18 18    Height:      Weight:      SpO2: 92% 94% 95%     Weight change:  Filed Weights   12/26/13 0454 12/26/13 0922  Weight: 76.204 kg (168 lb) 72.802 kg (160 lb 8 oz)   Body mass index is 19.54 kg/(m^2).   Gen Exam: Awake and alert with clear speech.   Neck: Supple, No JVD.   Chest: B/L Clear.  no rales or rhonchi.   CVS: S1 S2 irregularly irregular. Tachycardic.  Abdomen: soft, BS +, non tender, non distended.  Extremities: no edema, lower extremities warm to touch. Neurologic: Non Focal.   Skin: No Rash.   Wounds: N/A.    Intake/Output from previous day:  Intake/Output Summary (Last 24 hours) at 12/29/13 1209 Last data filed at 12/29/13 0900  Gross per 24 hour  Intake     90 ml  Output    475 ml  Net   -385 ml     LAB RESULTS: CBC  Recent Labs Lab 12/26/13 0500 12/27/13 0500 12/28/13 0625 12/28/13 1039 12/29/13 0820  WBC 11.7* 12.5* 11.8* 12.7* 12.1*  HGB 13.9 13.5 12.6* 13.4 13.0  HCT 40.5 40.2 37.7* 39.9 38.6*  PLT 132* 122* 120* 127* 148*  MCV 92.9 95.5 95.0 96.1 95.1  MCH 31.9 32.1 31.7 32.3 32.0  MCHC 34.3 33.6 33.4 33.6 33.7  RDW 15.5 16.1* 16.2* 16.5* 16.5*  LYMPHSABS 0.9  --   --   --   --   MONOABS 0.9  --   --   --   --   EOSABS 0.1  --   --   --   --   BASOSABS 0.0  --   --   --   --     Chemistries   Recent Labs Lab 12/26/13 0500 12/26/13 2018 12/27/13 0500 12/28/13 0625  12/28/13 1039 12/29/13 0820  NA 139  --  143 144 144 146  K 4.3  --  4.8 3.8 4.1 3.9  CL 100  --  105 106 105 107  CO2 25  --  24 24 26 25   GLUCOSE 156*  --  120* 142* 157* 127*  BUN 21  --  22 24* 23 26*  CREATININE 0.91  --  0.98 0.81 0.86 0.76  CALCIUM 8.8  --  8.8 8.3* 8.8 8.7  MG  --  2.0  --   --   --   --     CBG: No results found for this basename: GLUCAP,  in the last 168 hours  GFR Estimated Creatinine Clearance: 74.6 ml/min (by C-G formula based on Cr of 0.76).  Coagulation profile  Recent Labs Lab 12/28/13 1540  INR 1.15    Cardiac Enzymes  Recent Labs Lab 12/28/13 1039 12/28/13 1540 12/28/13 2255  TROPONINI 0.33* 0.32* 0.31*    No components found with this basename: POCBNP,  No results found for this basename: DDIMER,  in the last 72 hours  Recent Labs  12/28/13 1540  HGBA1C 6.0*   No results found for this basename: CHOL, HDL, LDLCALC, TRIG, CHOLHDL, LDLDIRECT,  in the last 72 hours  Recent Labs  12/27/13 0500  TSH 0.576   No results found for this basename: VITAMINB12, FOLATE, FERRITIN, TIBC, IRON, RETICCTPCT,  in the last 72 hours No results found for this basename: LIPASE, AMYLASE,  in the last 72 hours  Urine Studies No results found for this basename: UACOL, UAPR, USPG, UPH, UTP, UGL, UKET, UBIL, UHGB, UNIT, UROB, ULEU, UEPI, UWBC, URBC, UBAC, CAST, CRYS, UCOM, BILUA,  in the last 72 hours  MICROBIOLOGY: Recent Results (from the past 240 hour(s))  MRSA PCR SCREENING     Status: None   Collection Time    12/26/13  9:57 AM      Result Value Range Status   MRSA by PCR NEGATIVE  NEGATIVE Final   Comment:            The GeneXpert MRSA Assay (FDA     approved for NASAL specimens     only), is one component of a     comprehensive MRSA colonization     surveillance program. It is not     intended to diagnose MRSA     infection nor to guide or     monitor treatment for     MRSA infections.  CLOSTRIDIUM DIFFICILE BY PCR     Status:  None  Collection Time    12/26/13  3:25 PM      Result Value Range Status   C difficile by pcr NEGATIVE  NEGATIVE Final    RADIOLOGY STUDIES/RESULTS: No results found.  Oren Binet, MD  [facility] Pager:[phone]  If 7PM-7AM, please contact night-coverage [email or web address] Password [email or web address] 12/29/2013, 12:09 PM   LOS: 3 days

## 2013-12-29 NOTE — Progress Notes (Signed)
Echocardiogram 2D Echocardiogram has been performed.  [name] 12/29/2013, 3:20 PM

## 2013-12-29 NOTE — Progress Notes (Signed)
ANTICOAGULATION CONSULT NOTE - Initial Consult  Pharmacy Consult for Apixaban Indication: atrial fibrillation  Allergies  Allergen Reactions  . Amoxicillin Other (See Comments)    REACTION: n/v/d    Patient Measurements: Height: 6\' 4"  (193 cm) Weight: 160 lb 8 oz (72.802 kg) IBW/kg (Calculated) : 86.8 Heparin Dosing Weight:   Vital Signs: Temp: 99.3 F (37.4 C) (02/02 0517) Temp src: Oral (02/02 0517) BP: 120/79 mmHg (02/02 0517) Pulse Rate: 98 (02/02 0517)  Labs:  Recent Labs  12/28/13 0625 12/28/13 1039 12/28/13 1540 12/28/13 1922 12/28/13 2255 12/29/13 0820  HGB 12.6* 13.4  --   --   --  13.0  HCT 37.7* 39.9  --   --   --  38.6*  PLT 120* 127*  --   --   --  148*  LABPROT  --   --  14.5  --   --   --   INR  --   --  1.15  --   --   --   HEPARINUNFRC  --   --   --  0.26*  --  0.34  CREATININE 0.81 0.86  --   --   --  0.76  TROPONINI  --  0.33* 0.32*  --  0.31*  --     Estimated Creatinine Clearance: 74.6 ml/min (by C-G formula based on Cr of 0.76).   Medical History: Past Medical History  Diagnosis Date  . Mixed hyperlipidemia   . UNSPECIFIED ANEMIA   . CAD     a. 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  [name] Atrial fibrillation   . PREMATURE VENTRICULAR CONTRACTIONS     a. After CABG - was on Coumadin/Multaq for a period of time. Coumadin discontinued 04/2010 after maintaining NSR.  [name] Esophageal reflux   . IBS   . HYPERPLASIA, PROSTATE NOS W/URINARY OBST/LUTS   . LUMBAR RADICULOPATHY, RIGHT   . OSTEOPOROSIS   . INGUINAL HERNIA   . Lichen planus [id number]    [name]  . Diverticulosis 2011    Diverticulitis 2004  . PONV (postoperative nausea and vomiting)     Medications:  Scheduled:  . aspirin  81 mg Oral Daily  . atorvastatin  20 mg Oral q1800  . metoprolol tartrate  50 mg Oral BID  . pantoprazole (PROTONIX) IV  40 mg Intravenous Q24H  . sodium chloride  3 mL  Intravenous Q12H    Assessment: 80yo male with AFib, currently on Heparin and to start Apixaban this AM.  Heparin level is therapeutic with Hg wnl and pltc = 148 (trending up).  No bleeding problems noted.  Pt has taken Coumadin in the past, but was not on it at the time of admission.    Goal of Therapy:  Prevention of stroke Monitor platelets by anticoagulation protocol: Yes   Plan:  1-  D/C Heparin and Heparin related labs 2-  Apixaban 5mg  bid 3-  Monitor for s/s of bleeding  Gracy Bruins, PharmD [facility]

## 2013-12-30 LAB — CBC
HCT: 40.8 % (ref 39.0–52.0)
Hemoglobin: 13.6 g/dL (ref 13.0–17.0)
MCH: 31.8 pg (ref 26.0–34.0)
MCHC: 33.3 g/dL (ref 30.0–36.0)
MCV: 95.3 fL (ref 78.0–100.0)
PLATELETS: 170 10*3/uL (ref 150–400)
RBC: 4.28 MIL/uL (ref 4.22–5.81)
RDW: 16.4 % — AB (ref 11.5–15.5)
WBC: 11.9 10*3/uL — ABNORMAL HIGH (ref 4.0–10.5)

## 2013-12-30 LAB — BASIC METABOLIC PANEL
BUN: 31 mg/dL — ABNORMAL HIGH (ref 6–23)
CHLORIDE: 105 meq/L (ref 96–112)
CO2: 26 mEq/L (ref 19–32)
Calcium: 8.9 mg/dL (ref 8.4–10.5)
Creatinine, Ser: 0.94 mg/dL (ref 0.50–1.35)
GFR calc non Af Amer: 76 mL/min — ABNORMAL LOW (ref >90)
GFR, EST AFRICAN AMERICAN: 88 mL/min — AB (ref >90)
Glucose, Bld: 125 mg/dL — ABNORMAL HIGH (ref 70–99)
POTASSIUM: 3.7 meq/L (ref 3.7–5.3)
Sodium: 147 mEq/L (ref 137–147)

## 2013-12-30 MED ORDER — PANTOPRAZOLE SODIUM 40 MG PO TBEC
40.0000 mg | DELAYED_RELEASE_TABLET | Freq: Every day | ORAL | Status: DC
  Administered 2013-12-30: 40 mg via ORAL
  Filled 2013-12-30: qty 1

## 2013-12-30 MED ORDER — METOPROLOL TARTRATE 50 MG PO TABS
50.0000 mg | ORAL_TABLET | Freq: Every day | ORAL | Status: DC
  Administered 2013-12-30: 50 mg via ORAL
  Filled 2013-12-30 (×2): qty 1

## 2013-12-30 MED ORDER — METOPROLOL TARTRATE 50 MG PO TABS
75.0000 mg | ORAL_TABLET | Freq: Every morning | ORAL | Status: DC
  Administered 2013-12-30: 25 mg via ORAL
  Administered 2013-12-31: 75 mg via ORAL
  Filled 2013-12-30 (×3): qty 1

## 2013-12-30 NOTE — Progress Notes (Signed)
Patient ID: [name], male   DOB: 04-16-1930, 80 y.o.   MRN: [id number]    Subjective:  Denies SSCP, palpitations or Dyspnea Eating fruit stomach improved  HR over 150 with ambulation   Objective:  Filed Vitals:   12/29/13 1427 12/29/13 1734 12/29/13 2111 12/30/13 0430  BP: 112/70 124/73 113/81 127/85  Pulse: 94 100 119 111  Temp: 98.7 F (37.1 C) 99.5 F (37.5 C) 98.6 F (37 C) 98.5 F (36.9 C)  TempSrc: Oral Oral Oral Oral  Resp: 18 18 20 18   Height:      Weight:      SpO2: 93% 93% 95% 93%    Intake/Output from previous day:  Intake/Output Summary (Last 24 hours) at 12/30/13 1023 Last data filed at 12/30/13 0900  Gross per 24 hour  Intake      0 ml  Output   1200 ml  Net  -1200 ml    Physical Exam: Affect appropriate Healthy:  appears stated age HEENT: normal Neck supple with no adenopathy JVP normal no bruits no thyromegaly Lungs clear with no wheezing and good diaphragmatic motion Heart:  S1/S2 no murmur, no rub, gallop or click PMI normal Abdomen: benighn, BS positve, no tenderness, no AAA no bruit.  No HSM or HJR Distal pulses intact with no bruits No edema Neuro non-focal Skin warm and dry No muscular weakness   Lab Results: Basic Metabolic Panel:  Recent Labs  12/29/13 0820 12/30/13 0545  NA 146 147  K 3.9 3.7  CL 107 105  CO2 25 26  GLUCOSE 127* 125*  BUN 26* 31*  CREATININE 0.76 0.94  CALCIUM 8.7 8.9   CBC:  Recent Labs  12/29/13 0820 12/30/13 0545  WBC 12.1* 11.9*  HGB 13.0 13.6  HCT 38.6* 40.8  MCV 95.1 95.3  PLT 148* 170   Cardiac Enzymes:  Recent Labs  12/28/13 1039 12/28/13 1540 12/28/13 2255  TROPONINI 0.33* 0.32* 0.31*   Hemoglobin A1C:  Recent Labs  12/28/13 1540  HGBA1C 6.0*   Thyroid Function Tests: No results found for this basename: TSH, T4TOTAL, FREET3, T3FREE, THYROIDAB,  in the last 72 hours  Imaging: Dg Chest 2 View  12/28/2013   CLINICAL DATA:  AFib, cough, congestion  EXAM: CHEST  2 VIEW   COMPARISON:  DG CHEST 2 VIEW dated 12/04/2011  FINDINGS: There is bilateral chronic interstitial thickening. There is no focal parenchymal opacity, pleural effusion, or pneumothorax. The heart and mediastinal contours are unremarkable. Prior CABG.  There is degenerative disc disease throughout the thoracic spine.  IMPRESSION: Bilateral chronic interstitial thickening. Mild superimposed interstitial edema or atypical infection cannot be excluded.   Electronically Signed   By: Kathreen Devoid   On: 12/28/2013 18:36   Dg Abd 2 Views  12/29/2013   CLINICAL DATA:  Nausea.  EXAM: ABDOMEN - 2 VIEW  COMPARISON:  Two views of the abdomen 12/05/2011.  FINDINGS: No free intraperitoneal air is identified. The bowel gas pattern is nonobstructive. No abnormal abdominal calcification is seen. Convex left lumbar scoliosis is noted. Fixation hardware right hip again seen.  IMPRESSION: No acute finding.   Electronically Signed   By: Inge Rise M.D.   On: 12/29/2013 23:24    Cardiac Studies:  ECG:  Afib nonspecific ST/T wave changes    Telemetry: afib rate 90's   Echo:  Pending   Medications:   . apixaban  5 mg Oral BID  . aspirin  81 mg Oral Daily  . atorvastatin  20 mg Oral q1800  . azithromycin  500 mg Oral Daily  . metoprolol tartrate  50 mg Oral BID  . pantoprazole (PROTONIX) IV  40 mg Intravenous Q24H  . sodium chloride  3 mL Intravenous Q12H        Assessment/Plan:  Afib:  Essentially asymptomatic  Has had history of hard to control PAF  Previously on Multaq.  Previously on coumadin but Hard to control  No bleeding issues.  Increase lopressor 75 am and 50 pm    Continuet eliquis 5 bid.  Outpatient f/u With me 4 weeks and consider starting Multaq and [facility] if still in afib.  Advance diet as tolerated and ? D/c in am   Jenkins Rouge 12/30/2013, 10:23 AM

## 2013-12-30 NOTE — Progress Notes (Signed)
PATIENT DETAILS Name: [name] Age: 80 y.o. Sex: male Date of Birth: 1930/03/23 Admit Date: 12/26/2013 Admitting Physician Cristal Ford, DO ZOX:[id number] Linna Darner, MD  Subjective: Still with nausea this am 2/3.  Heart rate in the 160s when he is up out of bed.  In the 115 - 120s at rest.  Assessment/Plan: Principal Problem:   Gastroenteritis - patient admitted with nausea and vomiting, suspected viral gastroenteritis. He was provided with supportive care.  - No longer vomiting.  Still with nausea.  Heart Healthy diet.  Atrial fibrillation with rapid ventricular response - Patient has a known history of having paroxysmal A. fib post CABG in 2011. He apparently was on Multaq, digoxin and metoprolol. During that time he was also on Coumadin for a few months. He was not on any anticoagulation prior to this admission,claims coumadin was discontinued in 2011. - On admission, patient was in sinus rhythm, however around 2:00 am in 2/1 morning he was noted to be in A. fib with RVR. - He was placed on a heparin infusion which has been discontinued and he is now on Eliquis. - on 2/3 patient's HR is rising 120s at rest.  Defer management to cardiology.  Cough-?Atypical PNA - improving. - increased BNP,  treated with one dose of Lasix and 3 day course of Zithromax. - Incentive spirometry  Mixed hyperlipidemia  - Stable, continue with statins   CAD  - Stable, continue with aspirin, metoprolol and statins.   Esophageal reflux  - Prescribed PPI on discharge.   Dehydration  - Resolved with IV fluids.   Disposition: Remain inpatient  DVT Prophylaxis: On Eliquis  Code Status: Full code   Family Communication None at bedside   Procedures:   none  CONSULTS:  cardiology   MEDICATIONS: Scheduled Meds: . apixaban  5 mg Oral BID  . aspirin  81 mg Oral Daily  . atorvastatin  20 mg Oral q1800  . azithromycin  500 mg Oral Daily  . metoprolol tartrate  50 mg Oral Q2200   . metoprolol tartrate  75 mg Oral q morning - 10a  . pantoprazole (PROTONIX) IV  40 mg Intravenous Q24H  . sodium chloride  3 mL Intravenous Q12H   Continuous Infusions:   PRN Meds:.acetaminophen, acetaminophen, albuterol, HYDROcodone-acetaminophen, nitroGLYCERIN, promethazine  Antibiotics: Anti-infectives   Start     Dose/Rate Route Frequency Ordered Stop   12/29/13 1330  azithromycin (ZITHROMAX) tablet 500 mg     500 mg Oral Daily 12/29/13 1217 01/01/14 0959       PHYSICAL EXAM: Vital signs in last 24 hours: Filed Vitals:   12/29/13 1734 12/29/13 2111 12/30/13 0430 12/30/13 1027  BP: 124/73 113/81 127/85 127/78  Pulse: 100 119 111 116  Temp: 99.5 F (37.5 C) 98.6 F (37 C) 98.5 F (36.9 C)   TempSrc: Oral Oral Oral   Resp: 18 20 18    Height:      Weight:      SpO2: 93% 95% 93%     Weight change:  Filed Weights   12/26/13 0454 12/26/13 0922  Weight: 76.204 kg (168 lb) 72.802 kg (160 lb 8 oz)   Body mass index is 19.54 kg/(m^2).   Gen Exam: Awake and alert with clear speech.  pleasant  Neck: Supple, + Jugular venous pulsation. Chest: B/L Clear.  no rales or rhonchi.   CVS: S1 S2 irregularly irregular. Tachycardic.  Abdomen: thin, soft, BS +, non tender, non distended.  Extremities:  no edema, lower extremities warm to touch. Neurologic: Non Focal.   Skin: No Rash.      Intake/Output from previous day:  Intake/Output Summary (Last 24 hours) at 12/30/13 1033 Last data filed at 12/30/13 0900  Gross per 24 hour  Intake      0 ml  Output   1200 ml  Net  -1200 ml     LAB RESULTS: CBC  Recent Labs Lab 12/26/13 0500 12/27/13 0500 12/28/13 0625 12/28/13 1039 12/29/13 0820 12/30/13 0545  WBC 11.7* 12.5* 11.8* 12.7* 12.1* 11.9*  HGB 13.9 13.5 12.6* 13.4 13.0 13.6  HCT 40.5 40.2 37.7* 39.9 38.6* 40.8  PLT 132* 122* 120* 127* 148* 170  MCV 92.9 95.5 95.0 96.1 95.1 95.3  MCH 31.9 32.1 31.7 32.3 32.0 31.8  MCHC 34.3 33.6 33.4 33.6 33.7 33.3  RDW 15.5  16.1* 16.2* 16.5* 16.5* 16.4*  LYMPHSABS 0.9  --   --   --   --   --   MONOABS 0.9  --   --   --   --   --   EOSABS 0.1  --   --   --   --   --   BASOSABS 0.0  --   --   --   --   --     Chemistries   Recent Labs Lab 12/26/13 2018 12/27/13 0500 12/28/13 0625 12/28/13 1039 12/29/13 0820 12/30/13 0545  NA  --  143 144 144 146 147  K  --  4.8 3.8 4.1 3.9 3.7  CL  --  105 106 105 107 105  CO2  --  24 24 26 25 26   GLUCOSE  --  120* 142* 157* 127* 125*  BUN  --  22 24* 23 26* 31*  CREATININE  --  0.98 0.81 0.86 0.76 0.94  CALCIUM  --  8.8 8.3* 8.8 8.7 8.9  MG 2.0  --   --   --   --   --      Coagulation profile  Recent Labs Lab 12/28/13 1540  INR 1.15    Cardiac Enzymes  Recent Labs Lab 12/28/13 1039 12/28/13 1540 12/28/13 2255  TROPONINI 0.33* 0.32* 0.31*     Recent Labs  12/28/13 1540  HGBA1C 6.0*    MICROBIOLOGY: Recent Results (from the past 240 hour(s))  MRSA PCR SCREENING     Status: None   Collection Time    12/26/13  9:57 AM      Result Value Range Status   MRSA by PCR NEGATIVE  NEGATIVE Final   Comment:            The GeneXpert MRSA Assay (FDA     approved for NASAL specimens     only), is one component of a     comprehensive MRSA colonization     surveillance program. It is not     intended to diagnose MRSA     infection nor to guide or     monitor treatment for     MRSA infections.  CLOSTRIDIUM DIFFICILE BY PCR     Status: None   Collection Time    12/26/13  3:25 PM      Result Value Range Status   C difficile by pcr NEGATIVE  NEGATIVE Final    RADIOLOGY STUDIES/RESULTS: No results found.  Karen Kitchens [facility] Pager:[phone]  If 7PM-7AM, please contact night-coverage [email or web address] Password [email or web address] 12/30/2013, 10:33 AM   LOS: 4 days  Attending Patient seen and examined, agree with the assessment and plan. Heart still not optimal for discharge, Metoprolol dose has been adjusted by cardiology. Continue  with current measures.  Nena Alexander MD

## 2013-12-31 DIAGNOSIS — R5383 Other fatigue: Secondary | ICD-10-CM

## 2013-12-31 DIAGNOSIS — R5381 Other malaise: Secondary | ICD-10-CM

## 2013-12-31 MED ORDER — POLYETHYLENE GLYCOL 3350 17 G PO PACK: 17.0000 g | PACK | Freq: Every day | ORAL | Status: DC

## 2013-12-31 MED ORDER — DILTIAZEM HCL ER COATED BEADS 180 MG PO CP24: 180.0000 mg | ORAL_CAPSULE | Freq: Every day | ORAL | Status: DC

## 2013-12-31 MED ORDER — DILTIAZEM HCL ER COATED BEADS 180 MG PO CP24
180.0000 mg | ORAL_CAPSULE | Freq: Every day | ORAL | Status: DC
  Administered 2013-12-31: 180 mg via ORAL
  Filled 2013-12-31: qty 1

## 2013-12-31 MED ORDER — METOPROLOL TARTRATE 50 MG PO TABS: ORAL_TABLET | ORAL | Status: DC

## 2013-12-31 MED ORDER — SODIUM CHLORIDE 0.9 % IV SOLN: INTRAVENOUS | Status: DC

## 2013-12-31 MED ORDER — FLEET ENEMA 7-19 GM/118ML RE ENEM
1.0000 | ENEMA | Freq: Once | RECTAL | Status: AC
  Administered 2013-12-31: 1 via RECTAL
  Filled 2013-12-31: qty 1

## 2013-12-31 MED ORDER — POLYETHYLENE GLYCOL 3350 17 G PO PACK
17.0000 g | PACK | Freq: Every day | ORAL | Status: DC
  Administered 2013-12-31: 17 g via ORAL
  Filled 2013-12-31: qty 1

## 2013-12-31 NOTE — Discharge Summary (Signed)
Physician Discharge Summary  [name] KXF:[id number] DOB: [birth year] DOA: 12/26/2013  PCP: Unice Cobble, MD  Admit date: 12/26/2013 Discharge date: 12/31/2013  Time spent: 40 minutes  Recommendations for Outpatient Follow-up:  1. Followup with primary care physician within one to 2 weeks. 2. Followup with cardiology within 4 weeks.  Discharge Diagnoses:  Principal Problem:   Gastroenteritis Active Problems:   Mixed hyperlipidemia   CAD   Atrial fibrillation   Esophageal reflux   Dehydration   Nausea with vomiting   Paroxysmal a-fib   Discharge Condition: Stable  Diet recommendation: Heart Healthy diet  Filed Weights   12/26/13 0454 12/26/13 0922  Weight: 76.204 kg (168 lb) 72.802 kg (160 lb 8 oz)    History of present illness:  [name] is a 80 y.o. male  With a history of coronary artery disease status post bypass, nature fibrillation, hyperlipidemia that presents emergency department with complaints of nausea and vomiting that began around 4:00 yesterday afternoon. Patient does state he is living in assisted living community however does not claim to have been around sick contacts recently. Patient stated that his symptoms of nausea and vomiting started around 4:00, he was able to keep breakfast and lunch with no problems. His vomiting episodes occur approximately every 30 minutes to one hour. Patient states he did not notice any blood in his vomit. He did state that he has had some abdominal pain. He denies any cough or fever at this time. Nothing seems to make the vomiting better or worse. Patient was admitted for similar episode in 2014. Patient recently saw Dr. Linna Darner for similar complaints and was thought to have IBS which was aggravated by stress and wife's health issues.   Hospital Course:   1. Acute gastroenteritis: Patient admitted to the hospital with nausea vomiting, suspected viral gastroenteritis, patient improved in terms of nausea and vomiting but he  continues to have nausea every now and then. His diet advanced carefully and needed very well, he is for discharge today.  2. Atrial fibrillation with RVR: Patient has history of paroxysmal atrial fibrillation post CABG in 2011, apparently he was on Multaq, digoxin and metoprolol. He was also on Coumadin for few months. On admission he was in sinus rhythm and he developed itchy fibrillation with RVR during this admission, started on heparin infusion this was switched to Eliquis. Patient seen by cardiology recommended 75 mg of metoprolol in the morning and 15 the afternoon. Patient heart rate is in the high 90s with the metoprolol, so Cardizem 180 mg as patient getting discharge and his heart rate goes up to 150 with ambulation. Cardizem can be increased if he still has rapid heart rate.  3. Cough: Patient has increased BNP without lower extremity edema and pulmonary edema. This is treated with one dose of Lasix and 3 day course of azithromycin.  4. Mixed hyperlipidemia: Continue statin, stable.  5. Esophageal reflux: Patient is complaining about reflux symptoms, Protonix prescribed on discharge.  Procedures:   none   Consultations:   [place] [facility]  Discharge Exam: Filed Vitals:   12/31/13 1536  BP: 123/68  Pulse: 97  Temp: 98 F (36.7 C)  Resp: 18   General: Alert and awake, oriented x3, not in any acute distress. HEENT: anicteric sclera, pupils reactive to light and accommodation, EOMI CVS: S1-S2 clear, no murmur rubs or gallops Chest: clear to auscultation bilaterally, no wheezing, rales or rhonchi Abdomen: soft nontender, nondistended, normal bowel sounds, no organomegaly Extremities: no cyanosis, clubbing  or edema noted bilaterally Neuro: Cranial nerves II-XII intact, no focal neurological deficits  Discharge Instructions  Discharge Orders   Future Orders Complete By Expires   Call MD for:  persistant nausea and vomiting  As directed    Diet - low sodium heart healthy   As directed    Increase activity slowly  As directed        Medication List         aspirin 325 MG tablet  Take 325 mg by mouth daily.     cholecalciferol 1000 UNITS tablet  Commonly known as:  VITAMIN D  Take 1,000 Units by mouth every morning.     diltiazem 180 MG 24 hr capsule  Commonly known as:  CARDIZEM CD  Take 1 capsule (180 mg total) by mouth daily.     FIBER PO  Take 2 capsules by mouth every morning.     folic acid 1 MG tablet  Commonly known as:  FOLVITE  Take 1 tablet (1 mg total) by mouth daily.     metoprolol 50 MG tablet  Commonly known as:  LOPRESSOR  Take 1.5 (75 mg) tablet in the morning and 1 (50 mg) tablet in the evening     multivitamin with minerals Tabs tablet  Take 1 tablet by mouth daily.     nitroGLYCERIN 0.4 MG SL tablet  Commonly known as:  NITROSTAT  Place 1 tablet (0.4 mg total) under the tongue every 5 (five) minutes as needed for chest pain.     ondansetron 4 MG tablet  Commonly known as:  ZOFRAN  Take 1 tablet (4 mg total) by mouth every 6 (six) hours as needed for nausea or vomiting.     pantoprazole 40 MG tablet  Commonly known as:  PROTONIX  Take 1 tablet (40 mg total) by mouth daily.     polyethylene glycol packet  Commonly known as:  MIRALAX / GLYCOLAX  Take 17 g by mouth daily.     rosuvastatin 10 MG tablet  Commonly known as:  CRESTOR  Take 10 mg by mouth daily.     TUMS PO  Take 1 tablet by mouth 2 (two) times daily as needed. For calcium       Allergies  Allergen Reactions  . Amoxicillin Other (See Comments)    REACTION: n/v/d       Follow-up Information   Follow up with Unice Cobble, MD. Schedule an appointment as soon as possible for a visit in 1 week.   Specialty:  Internal Medicine   Contact information:   [phone] [facility] [address] [place] [address] [phone]       Follow up with Jenkins Rouge, MD In 4 weeks.   Specialty:  Cardiology   Contact information:   [address] [place] [address] [phone]        The results of significant diagnostics from this hospitalization (including imaging, microbiology, ancillary and laboratory) are listed below for reference.    Significant Diagnostic Studies: Dg Chest 2 View  12/28/2013   CLINICAL DATA:  AFib, cough, congestion  EXAM: CHEST  2 VIEW  COMPARISON:  DG CHEST 2 VIEW dated 12/04/2011  FINDINGS: There is bilateral chronic interstitial thickening. There is no focal parenchymal opacity, pleural effusion, or pneumothorax. The heart and mediastinal contours are unremarkable. Prior CABG.  There is degenerative disc disease throughout the thoracic spine.  IMPRESSION: Bilateral chronic interstitial thickening. Mild superimposed interstitial edema or atypical  infection cannot be excluded.   Electronically Signed   By: Kathreen Devoid   On: 12/28/2013 18:36   Dg Abd 2 Views  12/29/2013   CLINICAL DATA:  Nausea.  EXAM: ABDOMEN - 2 VIEW  COMPARISON:  Two views of the abdomen 12/05/2011.  FINDINGS: No free intraperitoneal air is identified. The bowel gas pattern is nonobstructive. No abnormal abdominal calcification is seen. Convex left lumbar scoliosis is noted. Fixation hardware right hip again seen.  IMPRESSION: No acute finding.   Electronically Signed   By: Inge Rise M.D.   On: 12/29/2013 23:24    Microbiology: Recent Results (from the past 240 hour(s))  MRSA PCR SCREENING     Status: None   Collection Time    12/26/13  9:57 AM      Result Value Range Status   MRSA by PCR NEGATIVE  NEGATIVE Final   Comment:            The GeneXpert MRSA Assay (FDA     approved for NASAL specimens     only), is one component of a     comprehensive MRSA colonization     surveillance program. It is not     intended to diagnose MRSA     infection nor to guide or     monitor treatment for     MRSA infections.  CLOSTRIDIUM DIFFICILE BY PCR     Status: None   Collection Time    12/26/13  3:25 PM       Result Value Range Status   C difficile by pcr NEGATIVE  NEGATIVE Final     Labs: Basic Metabolic Panel:  Recent Labs Lab 12/26/13 2018 12/27/13 0500 12/28/13 0625 12/28/13 1039 12/29/13 0820 12/30/13 0545  NA  --  143 144 144 146 147  K  --  4.8 3.8 4.1 3.9 3.7  CL  --  105 106 105 107 105  CO2  --  24 24 26 25 26   GLUCOSE  --  120* 142* 157* 127* 125*  BUN  --  22 24* 23 26* 31*  CREATININE  --  0.98 0.81 0.86 0.76 0.94  CALCIUM  --  8.8 8.3* 8.8 8.7 8.9  MG 2.0  --   --   --   --   --    Liver Function Tests:  Recent Labs Lab 12/26/13 0500  AST 20  ALT 14  ALKPHOS 51  BILITOT 0.6  PROT 6.8  ALBUMIN 3.6    Recent Labs Lab 12/26/13 0500  LIPASE 36   No results found for this basename: AMMONIA,  in the last 168 hours CBC:  Recent Labs Lab 12/26/13 0500 12/27/13 0500 12/28/13 0625 12/28/13 1039 12/29/13 0820 12/30/13 0545  WBC 11.7* 12.5* 11.8* 12.7* 12.1* 11.9*  NEUTROABS 9.8*  --   --   --   --   --   HGB 13.9 13.5 12.6* 13.4 13.0 13.6  HCT 40.5 40.2 37.7* 39.9 38.6* 40.8  MCV 92.9 95.5 95.0 96.1 95.1 95.3  PLT 132* 122* 120* 127* 148* 170   Cardiac Enzymes:  Recent Labs Lab 12/28/13 1039 12/28/13 1540 12/28/13 2255  TROPONINI 0.33* 0.32* 0.31*   BNP: BNP (last 3 results)  Recent Labs  12/28/13 1039  PROBNP 15279.0*   CBG: No results found for this basename: GLUCAP,  in the last 168 hours     Signed:  Thea Holshouser A  [facility] 12/31/2013, 4:03 PM

## 2013-12-31 NOTE — Progress Notes (Signed)
PT Cancellation and Discharge Note  Patient Details Name: ANTWOINE ZORN MRN: [id number] DOB: 06-11-1930   Cancelled Treatment:    Reason Eval/Treat Not Completed: PT screened, no needs identified, will sign off.  Spoke with RN and MD and no further needs for PT eval at this time.  Will sign off.     Yianna Tersigni, Thornton Papas 12/31/2013, 2:28 PM

## 2013-12-31 NOTE — Progress Notes (Signed)
Patient ID: [name], male   DOB: 1930/04/26, 80 y.o.   MRN: [id number]    Subjective:  Denies SSCP, palpitations or Dyspnea Still very nauseated   Objective:  Filed Vitals:   12/30/13 1347 12/30/13 2126 12/30/13 2211 12/31/13 0523  BP: 115/77 137/85 123/79 149/85  Pulse: 81 102 94 104  Temp: 97.8 F (36.6 C) 98.4 F (36.9 C) 98.4 F (36.9 C) 97.7 F (36.5 C)  TempSrc: Oral Oral Oral Oral  Resp: 18 20 18 18   Height:      Weight:      SpO2: 90% 93% 92% 95%    Intake/Output from previous day:  Intake/Output Summary (Last 24 hours) at 12/31/13 1740 Last data filed at 12/30/13 1832  Gross per 24 hour  Intake      0 ml  Output    200 ml  Net   -200 ml    Physical Exam: Affect appropriate Healthy:  appears stated age HEENT: normal Neck supple with no adenopathy JVP normal no bruits no thyromegaly Lungs clear with no wheezing and good diaphragmatic motion Heart:  S1/S2 no murmur, no rub, gallop or click PMI normal Abdomen: benighn, BS positve, no tenderness, no AAA no bruit.  No HSM or HJR Distal pulses intact with no bruits No edema Neuro non-focal Skin warm and dry No muscular weakness   Lab Results: Basic Metabolic Panel:  Recent Labs  12/29/13 0820 12/30/13 0545  NA 146 147  K 3.9 3.7  CL 107 105  CO2 25 26  GLUCOSE 127* 125*  BUN 26* 31*  CREATININE 0.76 0.94  CALCIUM 8.7 8.9   CBC:  Recent Labs  12/29/13 0820 12/30/13 0545  WBC 12.1* 11.9*  HGB 13.0 13.6  HCT 38.6* 40.8  MCV 95.1 95.3  PLT 148* 170   Cardiac Enzymes:  Recent Labs  12/28/13 1039 12/28/13 1540 12/28/13 2255  TROPONINI 0.33* 0.32* 0.31*   Hemoglobin A1C:  Recent Labs  12/28/13 1540  HGBA1C 6.0*    Imaging: Dg Abd 2 Views  12/29/2013   CLINICAL DATA:  Nausea.  EXAM: ABDOMEN - 2 VIEW  COMPARISON:  Two views of the abdomen 12/05/2011.  FINDINGS: No free intraperitoneal air is identified. The bowel gas pattern is nonobstructive. No abnormal abdominal  calcification is seen. Convex left lumbar scoliosis is noted. Fixation hardware right hip again seen.  IMPRESSION: No acute finding.   Electronically Signed   By: Inge Rise M.D.   On: 12/29/2013 23:24    Cardiac Studies:  ECG:  Afib nonspecific ST/T wave changes    Telemetry: afib rate 90's   Echo:  EF 55-60%   Medications:   . apixaban  5 mg Oral BID  . aspirin  81 mg Oral Daily  . atorvastatin  20 mg Oral q1800  . azithromycin  500 mg Oral Daily  . metoprolol tartrate  50 mg Oral Q2200  . metoprolol tartrate  75 mg Oral q morning - 10a  . pantoprazole  40 mg Oral QHS  . sodium chloride  3 mL Intravenous Q12H        Assessment/Plan:  Afib:  Essentially asymptomatic  Has had history of hard to control PAF  Previously on Multaq.  Previously on coumadin but INR;s were hard to keep Rx   No bleeding issues.  Increase lopressor 75 am and 50 pm    Continuet eliquis 5 bid.  Outpatient f/u With me 4 weeks and consider starting Multaq and [facility] if still in  afib.  Advance diet as tolerated  KUB benign Exam benign ? Change azithromycin as this may be contributing to stomach upset  Jenkins Rouge 12/31/2013, 8:06 AM

## 2013-12-31 NOTE — Progress Notes (Signed)
Patient discharge teaching given, including activity, diet, follow-up appoints, and medications. Patient verbalized understanding of all discharge instructions. IV access was d/c'd. Vitals are stable. Skin is intact except as charted in most recent assessments. Pt to be escorted out by NT, to be driven home (indep living at [facility]) by [employer] driver.

## 2014-01-08 ENCOUNTER — Ambulatory Visit (INDEPENDENT_AMBULATORY_CARE_PROVIDER_SITE_OTHER): Payer: Medicare Other | Admitting: Internal Medicine

## 2014-01-08 ENCOUNTER — Other Ambulatory Visit (INDEPENDENT_AMBULATORY_CARE_PROVIDER_SITE_OTHER): Payer: Medicare Other

## 2014-01-08 ENCOUNTER — Encounter: Payer: Self-pay | Admitting: Internal Medicine

## 2014-01-08 VITALS — BP 100/70 | HR 80 | Temp 97.1°F | Resp 14 | Ht 76.0 in | Wt 152.0 lb

## 2014-01-08 DIAGNOSIS — I4891 Unspecified atrial fibrillation: Secondary | ICD-10-CM

## 2014-01-08 DIAGNOSIS — I480 Paroxysmal atrial fibrillation: Secondary | ICD-10-CM

## 2014-01-08 DIAGNOSIS — R112 Nausea with vomiting, unspecified: Secondary | ICD-10-CM

## 2014-01-08 DIAGNOSIS — R7309 Other abnormal glucose: Secondary | ICD-10-CM

## 2014-01-08 DIAGNOSIS — R739 Hyperglycemia, unspecified: Secondary | ICD-10-CM | POA: Insufficient documentation

## 2014-01-08 LAB — BASIC METABOLIC PANEL
BUN: 20 mg/dL (ref 6–23)
CO2: 34 mEq/L — ABNORMAL HIGH (ref 19–32)
Calcium: 9.4 mg/dL (ref 8.4–10.5)
Chloride: 102 mEq/L (ref 96–112)
Creatinine, Ser: 0.9 mg/dL (ref 0.4–1.5)
GFR: 81.81 mL/min (ref >60.00)
Glucose, Bld: 120 mg/dL — ABNORMAL HIGH (ref 70–99)
Potassium: 3.8 mEq/L (ref 3.5–5.1)
Sodium: 145 mEq/L (ref 135–145)

## 2014-01-08 LAB — T3, FREE: T3 FREE: 2.3 pg/mL (ref 2.3–4.2)

## 2014-01-08 LAB — T4, FREE: Free T4: 1.21 ng/dL (ref 0.60–1.60)

## 2014-01-08 LAB — TSH: TSH: 1.37 u[IU]/mL (ref 0.35–5.50)

## 2014-01-08 NOTE — Assessment & Plan Note (Signed)
Take EC ASA AFTER  Breakfast. Protonix bid pre meals;D/C Ranitidine May need GI referral BMET

## 2014-01-08 NOTE — Assessment & Plan Note (Signed)
Full TFTs ?

## 2014-01-08 NOTE — Progress Notes (Signed)
Pre visit review using our clinic review tool, if applicable. No additional management support is needed unless otherwise documented below in the visit note/SLS  

## 2014-01-08 NOTE — Patient Instructions (Signed)
Reflux of gastric acid may be asymptomatic as this may occur mainly during sleep.The triggers for reflux  include stress; the "aspirin family" ; alcohol; peppermint; and caffeine (coffee, tea, cola, and chocolate). The aspirin family would include aspirin and the nonsteroidal agents such as ibuprofen &  Naproxen. Tylenol would not cause reflux. If having symptoms ; food & drink should be avoided for @ least 2 hours before going to bed.   Stop the ranitidine. Take the Protonix before breakfast and before the evening meal. If nausea and vomiting persist; GI referral recommended.  Technique noted fullness aspirin after breakfast with food on your stomach.

## 2014-01-08 NOTE — Progress Notes (Signed)
   Subjective:    Patient ID: [name], male    DOB: 01/01/1930, 80 y.o.   MRN: [id number]  [facility] records 1/30-12/31/13 were reviewed and summarized in the problem list. He was diagnosed with probable gastroenteritis  His hospitalization was complicated by paroxysmal atrial fibrillation. His BNP was  15, 279. This was treated with pulse doses of furosemide. Chest x-ray did show increased interstitial markings suggesting edema versus atypical infection. He received several days of azithromycin. TSH was 0.576  He was also found to have hyperglycemia with glucoses up to 125. His A1c was nondiabetic at 6.0%  He was discharged on Zofran & PPI. He also was prescribed diltiazem and and beta blocker was increased. A novel oral  anticoagulant was initially entertained; but he was discharged on 325 mg of aspirin daily instead.  He continues to have some nausea and vomiting with colas or even water. He was able to drink all milk today.    Review of Systems      Objective:   Physical Exam  General appearance:thin but in good health and nourishment w/o distress.  Eyes: No conjunctival inflammation or scleral icterus is present.  Oral exam: Dental hygiene is good; lips and gums are healthy appearing.There is no oropharyngeal erythema or exudate noted.   Heart:  Irregular rhythm & rate. S1 and S2 normal without gallop, murmur, click, rub or other extra sounds     Lungs:Chest clear to auscultation; no wheezes, rhonchi,rales ,or rubs present.No increased work of breathing.   Abdomen: bowel sounds normal, soft and non-tender without masses, organomegaly or hernias noted.  No guarding or rebound . No tenderness over the flanks to percussion. Aorta palpable  Musculoskeletal: Able to lie flat and sit up without help. Negative straight leg raising bilaterally.  Skin:Warm & dry.  Intact without suspicious lesions or rashes ; no jaundice or tenting  Lymphatic: No lymphadenopathy is noted about  the head, neck, axilla             Assessment & Plan:  See Current Assessment & Plan in Problem List under specific Diagnosis

## 2014-01-14 ENCOUNTER — Other Ambulatory Visit: Payer: Self-pay | Admitting: Internal Medicine

## 2014-01-14 ENCOUNTER — Telehealth: Payer: Self-pay | Admitting: *Deleted

## 2014-01-14 MED ORDER — METOPROLOL TARTRATE 50 MG PO TABS: ORAL_TABLET | ORAL | Status: DC

## 2014-01-14 NOTE — Telephone Encounter (Signed)
Phoned patient and left voicemail message with PCP's recommendation.

## 2014-01-14 NOTE — Telephone Encounter (Signed)
Almyra Free, PT with [facility], phoned triage line & left voicemail Monday at 1623, stating that on two separate visits, Friday & Monday, patient's bp was low (99/78 and 100/58) and was c/o of dizziness.  Is inquiring if MD would like to change any medications, recommendations, etc?  Please advise.  Almyra Free, [place], CB# [phone]

## 2014-01-14 NOTE — Telephone Encounter (Signed)
Because of hypotension; decrease metoprolol from 1.5 twice a day to one pill twice a day.  Monitor blood pressure and report significant hypotension or associated symptoms.

## 2014-01-15 ENCOUNTER — Telehealth: Payer: Self-pay | Admitting: *Deleted

## 2014-01-15 ENCOUNTER — Ambulatory Visit (INDEPENDENT_AMBULATORY_CARE_PROVIDER_SITE_OTHER): Payer: Medicare Other | Admitting: Internal Medicine

## 2014-01-15 ENCOUNTER — Encounter: Payer: Self-pay | Admitting: Internal Medicine

## 2014-01-15 VITALS — BP 100/70 | HR 72 | Temp 97.9°F | Resp 14 | Wt 164.8 lb

## 2014-01-15 DIAGNOSIS — I480 Paroxysmal atrial fibrillation: Secondary | ICD-10-CM

## 2014-01-15 DIAGNOSIS — R42 Dizziness and giddiness: Secondary | ICD-10-CM

## 2014-01-15 DIAGNOSIS — I4891 Unspecified atrial fibrillation: Secondary | ICD-10-CM

## 2014-01-15 MED ORDER — METOPROLOL TARTRATE 50 MG PO TABS: ORAL_TABLET | ORAL | Status: DC

## 2014-01-15 NOTE — Progress Notes (Signed)
Subjective:    Patient ID: [name], male    DOB: 07-02-1930, 80 y.o.   MRN: [id number]  HPI   He presents with dizziness and fatigue.  He was found to have significant hypotension prompting decreasing his beta blocker dose from 75 mg twice in am & 50 mg in evening  to 50 mg twice a day yesterday. The beta blocker had been increased during his hospitalization 1/30-2/4 for presumed gastroenteritis associated with a 10# weight loss. His hospital course was complicated by paroxysmal atrial fibrillation prompting the increase in the beta blocker and addition of the calcium channel blocker. Prior to auscultation he had been on metoprolol 50 mg one half twice a day.  At that time his TSH was low normal at 0.576; but repeat full thyroid function tests were normal. Chemistries were normal 2/12. CBC was normal 2/3.  He's had the dizziness since discharge from the hospital. Blood pressures range 100-112 over the 80s. In the past his systolic blood pressures been in the 120s. Position change does not not affect his dizziness.  He does describe tinnitus which has been a chronic problem for years.  Significantly he has been under significant stress as his wife was hospitalized with probable urosepsis    Review of Systems   He denies any melena or rectal bleeding. His appetite has improved in the last 3-6 days. He denies any dyspnea or palpitations. He denies fever, chills, sweats, or progressive weight loss. He has no sinus pain or pressure or associated discolored nasal secretions. Additionally he denies blurred vision, double vision, or loss of vision.  There has been no specific trigger or exacerbating factor for the dizziness. He denies frank vertigo. He also has no numbness, tingling, weakness in his extremities.     Objective:   Physical Exam Gen.: Thin but adequately nourished in appearance. Alert, appropriate and cooperative throughout exam. Appears younger than stated age  Head:  Normocephalic without obvious abnormalities Eyes: No corneal or conjunctival inflammation noted. Pupils equal round reactive to light and accommodation. Extraocular motion intact. No icterus ,protosis,or lid lag Ears: External  ear exam reveals no significant lesions or deformities. Canals clear .TMs normal. Hearing is grossly decreased bilaterally. Nose: External nasal exam reveals no deformity or inflammation. Nasal mucosa are pink and moist. No lesions or exudates noted.   Mouth: Oral mucosa and oropharynx reveal no lesions or exudates. Teeth in good repair. Neck: No deformities, masses, or tenderness noted. Range of motion & Thyroid normal. Lungs: Normal respiratory effort; chest expands symmetrically. Lungs are clear to auscultation without rales, wheezes, or increased work of breathing. Heart: Normal rate and rhythm. Normal S1 and S2. No gallop, click, or rub. S4 w/o murmur. Abdomen: Bowel sounds normal; abdomen soft and nontender. No masses, organomegaly or hernias noted.           Musculoskeletal/extremities: No clubbing, cyanosis or significant extremity  deformity noted. 1/2 + edema.Range of motion normal .Tone & strength normal. Hand joints reveal mild DJD changes  Fingernail  health good. Able to lie down & sit up w/o help. Negative SLR bilaterally Vascular: Carotid, radial artery, dorsalis pedis and  posterior tibial pulses are full and equal. No bruits present. Neurologic: Alert and oriented x3. Deep tendon reflexes symmetrical and 0-1/2+.  Gait normal   . Rhomberg & finger to nose       Skin: Intact without suspicious lesions or rashes. Lymph: No cervical, axillary lymphadenopathy present. Psych: Slightly flat affect. Normally interactive  Supine BP:116/65 Sitting BP:116/62 Standing BP:112/60 No change in pulse                                                                                               Assessment & Plan:  #1 dizziness #2  fatigue,multifactorial #3 PAF, in NSR now  Plan: He will continue the calcium channel blocker; but he will decrease the beta blocker to 50 mg one half twice a day. Isometrics recommended prior to standing. If symptoms persist or progress; support hose would be recommended.

## 2014-01-15 NOTE — Telephone Encounter (Signed)
[facility] Triage Call Report Triage Record Num: [id number] Operator: Agustina Caroli Patient Name: [name] Call Date & Time: 01/14/2014 5:33:00PM Patient Phone: [phone] PCP: Patient Gender: Male PCP Fax : Patient DOB: Apr 18, 1930 Practice Name: [facility] Reason for Call: [name] states he had onset of weakness when he was in hospital. Was admitted to due nausea, vomiting ,diarrhea. Had epsiode of Atrial Fibrillation. Intermittent dizziness. States his Metoprolol 25mg  BID was changed to Metoprolol 50 mg - one and one half tab in AM and one tab in PM. States he was also started on Diltiazem 180 mg - one 24 hrs tab daily. States he has already taken Diltiazem on 2/18. Is concerned about continuing Diltiazem. Per weakness or paralysis protocol has see provider within 24 hrs due to symptoms began after starting prescription medication in hospital. Appointment scheduled in [facility] for 10:00 on 01/15/14 with Dr. Linna Darner. Per [facility] message was left on Sophie's voicemail that Dr. Linna Darner recommended that due to hypotension Dr. Linna Darner recommended [name] decrease the Metoprolol 50 mg to one tab twice a day. To monitor blood pressure and report significant low blood pressure or symptoms associated with low blood pressure. [name] states he has not received message- has not checked voicemail. Message given and explained to [name] and he repeats understanding. Care advice given. Protocol(s) Used: Weakness or Paralysis Recommended Outcome per Protocol: See Provider within 24 hours Reason for Outcome: Symptoms began after starting or changing dose of prescription, nonprescription, alternative medication, or illicit drug Care Advice: ~ SYMPTOM / CONDITION MANAGEMENT Call EMS 911 if having chest pain, sudden severe shortness of breath, loss of consciousness, or signs of shock (such as unable to stand due to faintness, dizziness, or lightheadedness; new onset of confusion; slow to respond or difficult to awaken;  skin is pale, gray, cool, or moist to touch; severe weakness). ~ 02/

## 2014-01-15 NOTE — Progress Notes (Signed)
Pre visit review using our clinic review tool, if applicable. No additional management support is needed unless otherwise documented below in the visit note. 

## 2014-01-15 NOTE — Patient Instructions (Addendum)
Perform isometric exercise of calves  ( while seated go up on toes to count of 5 & then onto heels for 5 count). Repeat  4- 5 times prior to standing if you've been seated or supine for any significant period of time as BP drops with such positions.   12/13/33 [employer]  For 2/12-4/12/15  reviewed & completed. Discrepancies noted between EMR Med List & meds listed on form . Handwritten notes made on form  & request made for clarification as follows:ASA to be EC & taken after b'fast. PPI only 1 X / day 30 min pre b'fast. B blocker 50 1/2 bid. D/C 81 mg ASA & oral iron.

## 2014-01-23 DIAGNOSIS — I2510 Atherosclerotic heart disease of native coronary artery without angina pectoris: Secondary | ICD-10-CM

## 2014-01-23 DIAGNOSIS — I4891 Unspecified atrial fibrillation: Secondary | ICD-10-CM

## 2014-01-23 DIAGNOSIS — IMO0001 Reserved for inherently not codable concepts without codable children: Secondary | ICD-10-CM

## 2014-01-23 DIAGNOSIS — R269 Unspecified abnormalities of gait and mobility: Secondary | ICD-10-CM

## 2014-01-28 ENCOUNTER — Other Ambulatory Visit: Payer: Self-pay | Admitting: *Deleted

## 2014-01-28 DIAGNOSIS — I480 Paroxysmal atrial fibrillation: Secondary | ICD-10-CM

## 2014-01-28 MED ORDER — PANTOPRAZOLE SODIUM 40 MG PO TBEC: 40.0000 mg | DELAYED_RELEASE_TABLET | Freq: Two times a day (BID) | ORAL | Status: DC

## 2014-01-28 MED ORDER — DILTIAZEM HCL ER COATED BEADS 180 MG PO CP24: 180.0000 mg | ORAL_CAPSULE | Freq: Every day | ORAL | Status: DC

## 2014-01-28 MED ORDER — METOPROLOL TARTRATE 50 MG PO TABS: ORAL_TABLET | ORAL | Status: DC

## 2014-01-28 NOTE — Addendum Note (Signed)
Addended by: Harl Bowie on: 01/28/2014 09:55 AM   Modules accepted: Orders

## 2014-01-28 NOTE — Telephone Encounter (Addendum)
Rx's faxed to the pharmacy.//AB/CMA 

## 2014-01-29 ENCOUNTER — Other Ambulatory Visit: Payer: Self-pay | Admitting: Cardiovascular Disease

## 2014-01-29 ENCOUNTER — Ambulatory Visit (INDEPENDENT_AMBULATORY_CARE_PROVIDER_SITE_OTHER): Payer: Medicare Other | Admitting: Cardiovascular Disease

## 2014-01-29 ENCOUNTER — Encounter: Payer: Self-pay | Admitting: Cardiovascular Disease

## 2014-01-29 VITALS — BP 110/72 | HR 60 | Ht 76.0 in | Wt 155.0 lb

## 2014-01-29 DIAGNOSIS — I480 Paroxysmal atrial fibrillation: Secondary | ICD-10-CM

## 2014-01-29 DIAGNOSIS — I4891 Unspecified atrial fibrillation: Secondary | ICD-10-CM

## 2014-01-29 MED ORDER — APIXABAN 5 MG PO TABS: 5.0000 mg | ORAL_TABLET | Freq: Two times a day (BID) | ORAL | Status: DC

## 2014-01-29 MED ORDER — ASPIRIN EC 81 MG PO TBEC: 81.0000 mg | DELAYED_RELEASE_TABLET | Freq: Every day | ORAL | Status: DC

## 2014-01-29 MED ORDER — DRONEDARONE HCL 400 MG PO TABS: 200.0000 mg | ORAL_TABLET | Freq: Two times a day (BID) | ORAL | Status: DC

## 2014-01-29 MED ORDER — DRONEDARONE HCL 400 MG PO TABS: 400.0000 mg | ORAL_TABLET | Freq: Two times a day (BID) | ORAL | Status: DC

## 2014-01-29 NOTE — Assessment & Plan Note (Signed)
Cholesterol is at goal.  Continue current dose of statin and diet Rx.  No myalgias or side effects.  F/U  LFT's in 6 months. Lab Results  Component Value Date   LDLCALC 46 05/20/2010

## 2014-01-29 NOTE — Progress Notes (Signed)
Patient ID: [name], male   DOB: 06-02-1930, 80 y.o.   MRN: [id number] Mr. Thomley is an 80 y/o M with history of CAD s/p CABGx3 in 4193 complicated by post-op atrial fibrillation, hyperlipidemia, GERD who presented to [facility] on 12/26/13 with abupt onset of 12 hours of nausea and vomiting (non-bloody "phlegm") along with some abdominal pain. He did not have any fever or diarrhea. He did have a mild leukocytosis. This was felt to represent a viral gastroenteritis. He lives in an ALF and a resident next door had a URI but no recent contacts with GI illnesses. He was admitted and given supportive care with IV fluids and antiemetics. He was due to be discharged yesterday but had persistent nausea so was kept for observation. His GI symptoms have resolved. Around 2am, he was noted to go into atrial fibrillation. He does endorse an awareness of palpitations and denies any recent feeling of these outside the hospital. Denies chest pain or SOB - he just finished a walk around the unit when I came to interview him and denied anginal symptoms. While ambulating, HR was 110-120. His metoprolol was increased from 25 to 50mg  just over an hour ago. He does have occasional orthonpea. No LEE, weight change. He has history of AF after CABG treated with Multaq but had converted to NSR and Coumadin was discontinued in 04/2010. He denies any history of stroke/ministroke, falls, or prior/current bleeding issues.  Labwork reveals Hgb 12.6, thrombocytopenia 120 but has been getting fluids. Initial troponin neg x 1, lipase/LFTs/lactic acid wnl. TSH wnl. C diff negative and FOBT neg.  D/c from hospital 1/15  Unfortunately he was not sent home on Eliquis ?? Still feels washed out and does not feel like abdomen is normal yet     ROS: Denies fever, malais, weight loss, blurry vision, decreased visual acuity, cough, sputum, SOB, hemoptysis, pleuritic pain, palpitaitons, heartburn, abdominal pain, melena, lower extremity  edema, claudication, or rash.  All other systems reviewed and negative  General: Affect appropriate Thin white male  HEENT: normal Neck supple with no adenopathy JVP normal no bruits no thyromegaly Lungs clear with no wheezing and good diaphragmatic motion Heart:  S1/S2 no murmur, no rub, gallop or click PMI normal Abdomen: benighn, BS positve, no tenderness, no AAA no bruit.  No HSM or HJR Distal pulses intact with no bruits No edema Neuro non-focal Skin warm and dry No muscular weakness   Current Outpatient Prescriptions  Medication Sig Dispense Refill  . aspirin 325 MG tablet Take 325 mg by mouth daily.        . Calcium Carbonate Antacid (TUMS PO) Take 1 tablet by mouth 2 (two) times daily as needed. For calcium       . cholecalciferol (VITAMIN D) 1000 UNITS tablet Take 1,000 Units by mouth every morning.        [name] FIBER PO Take 2 capsules by mouth every morning.        . folic acid (FOLVITE) 1 MG tablet Take 1 tablet (1 mg total) by mouth daily.  30 tablet  6  . metoprolol succinate (TOPROL-XL) 25 MG 24 hr tablet Take 25 mg by mouth 2 (two) times daily.      . Multiple Vitamin (MULITIVITAMIN WITH MINERALS) TABS Take 1 tablet by mouth daily.        . nitroGLYCERIN (NITROSTAT) 0.4 MG SL tablet Place 1 tablet (0.4 mg total) under the tongue every 5 (five) minutes as needed for chest pain.  30 tablet  0  . rosuvastatin (CRESTOR) 10 MG tablet Take 10 mg by mouth daily.       No current facility-administered medications for this visit.    Allergies  Amoxicillin  Electrocardiogram:  afib nonspecific ST T wave changes   Assessment and Plan

## 2014-01-29 NOTE — Assessment & Plan Note (Signed)
Stable with no angina and good activity level.  Continue medical Rx  

## 2014-01-29 NOTE — Assessment & Plan Note (Signed)
Still in afib  Good response to multaq in past  Start Eliquis 5 bid after 2 weeks start multaq 400 bid  F/u with me 6 weeks if no conversion will set up [facility]

## 2014-01-29 NOTE — Telephone Encounter (Signed)
Pt has questions about    Pt does not remember Dr Johnsie Cancel prescribing this medication DILTIAZEM 180 mg to him and METOPOROL?  Pt has questions about his medications please give him a call back.

## 2014-01-29 NOTE — Patient Instructions (Addendum)
Your physician has recommended you make the following change in your medication: 1. Start Eliquis 5 MG 1 tablet BID 2. Start Multaq 400 MG 1 tablet Twice a day 3. Stop Aspirin 325 and Decrease to 81 MG 1 Tablet daily Both have been sent to your pharmacy for you.  Your physician recommends that you schedule a follow-up appointment in: 6 weeks with Dr Johnsie Cancel

## 2014-03-09 ENCOUNTER — Encounter: Payer: Self-pay | Admitting: Internal Medicine

## 2014-03-09 ENCOUNTER — Ambulatory Visit (INDEPENDENT_AMBULATORY_CARE_PROVIDER_SITE_OTHER): Payer: Medicare Other | Admitting: Internal Medicine

## 2014-03-09 VITALS — BP 100/56 | HR 49 | Temp 96.2°F | Wt 156.0 lb

## 2014-03-09 DIAGNOSIS — M1990 Unspecified osteoarthritis, unspecified site: Secondary | ICD-10-CM

## 2014-03-09 DIAGNOSIS — J310 Chronic rhinitis: Secondary | ICD-10-CM

## 2014-03-09 DIAGNOSIS — R001 Bradycardia, unspecified: Secondary | ICD-10-CM

## 2014-03-09 DIAGNOSIS — I498 Other specified cardiac arrhythmias: Secondary | ICD-10-CM

## 2014-03-09 NOTE — Progress Notes (Signed)
   Subjective:    Patient ID: [name], male    DOB: 1930-01-24, 80 y.o.   MRN: [id number]  HPI Congestion and runny nose began one week ago. Nasal drainage is clear. No sneezing or watery eyes. No cough but is clearing throat often especially in am. No fever but has had some chills. Overall upper respiratory symptoms have improved. Was exposed to another resident where he lives who was sick.Has not taken in OTC meds.   Review of Systems Constitutional: Positive for activity change and fatigue. Negative for diaphoresis.  HENT: Positive for rhinorrhea. Negative for ear discharge, ear pain, postnasal drip, sinus pressure, sneezing and sore throat.  Respiratory: Negative for shortness of breath.  Cardiovascular: Negative for chest pain, palpitations and leg swelling.  Diagnosed with a fib in January. Has appointment with cardiologist next week.  Feels he has not fully recovered from gastritis the end of January. Has had decreased appetite, has lost 12 pounds since January.  GU; nocturia X 4 since 01/15     Objective:   Physical Exam Constitutional: No distress. Thin.  HENT: Septal dislocation to L Head: Normocephalic and atraumatic.  Eyes: Pupils are equal, round, and reactive to light. Right eye exhibits no discharge. Left eye exhibits no discharge. No scleral icterus.  Neck: Neck supple.  Cardiovascular: Slow rate, regular rhythm, normal heart sounds and intact distal pulses.  Pulmonary/Chest: Effort normal and breath sounds normal. No respiratory distress. He has no wheezes. He has no rales. He exhibits no tenderness.  Abdominal: Soft. Bowel sounds are normal. He exhibits no distension and no mass. There is no tenderness.  Musculoskeletal: He exhibits no edema. PIP & DIP thumb changes of DJD w/o evidence of tenosynovitis Lymphadenopathy:  He has no cervical adenopathy (no axillary adenopathy).  Neurological: He is alert.  Skin: Skin is warm and dry. He is not diaphoretic.           Assessment & Plan:  #1 rhinitis #2 drug induced bradycardia, Cardiology appt 4/20 #3 DJD thumb #4 nocturia , as per Urology See AVS

## 2014-03-09 NOTE — Patient Instructions (Addendum)
Use an anti-inflammatory cream such as Aspercreme or Zostrix cream twice a day to the affected area as needed. In lieu of this warm moist compresses or  hot water bottle can be used. Do not apply ice .Plain Mucinex (NOT D) for thick secretions ;force NON dairy fluids .   Nasal cleansing in the shower as discussed with lather of mild shampoo.After 10 seconds wash off lather while  exhaling through nostrils. Make sure that all residual soap is removed to prevent irritation.  Flonase OR Nasacort AQ 1 spray in each nostril twice a day as needed. Use the "crossover" technique into opposite nostril spraying toward opposite ear @ 45 degree angle, not straight up into nostril.  Use a Neti pot daily only  as needed for significant sinus congestion; going from open side to congested side . Plain Allegra (NOT D )  160 daily , Loratidine 10 mg , OR Zyrtec 10 mg @ bedtime  as needed for itchy eyes & sneezing. See your Urologist concerning night time urination.

## 2014-03-09 NOTE — Progress Notes (Signed)
Pre visit review using our clinic review tool, if applicable. No additional management support is needed unless otherwise documented below in the visit note. 

## 2014-03-09 NOTE — Progress Notes (Signed)
   Subjective:    Patient ID: [name], male    DOB: 04-23-1930, 80 y.o.   MRN: [id number]  HPI Congestion and runny nose began one week ago. Nasal drainage is clear. No sneezing or watery eyes. No cough but is clearing throat often especially in am. No fever but has had some chills. Was exposed to another resident where he lives who was sick.Has not taken in OTC meds.  Has pain in left thumb metacarpal which has hurt off and on for weeks and more pronounced last night.    Review of Systems  Constitutional: Positive for activity change and fatigue. Negative for diaphoresis.  HENT: Positive for rhinorrhea. Negative for ear discharge, ear pain, postnasal drip, sinus pressure, sneezing and sore throat.   Respiratory: Negative for shortness of breath.   Cardiovascular: Negative for chest pain, palpitations and leg swelling.  Diagnosed with a fib in January. Has appointment with cardiologist next week.  Feels he has not fully recovered from gastritis the end of January. Has had decreased appetite, has lost 12 pounds since January.    Objective:   Physical Exam  Constitutional: No distress.  Thin.  HENT:  Head: Normocephalic and atraumatic.  Eyes: Pupils are equal, round, and reactive to light. Right eye exhibits no discharge. Left eye exhibits no discharge. No scleral icterus.  Neck: Neck supple.  Cardiovascular: Normal rate, regular rhythm, normal heart sounds and intact distal pulses.   Pulmonary/Chest: Effort normal and breath sounds normal. No respiratory distress. He has no wheezes. He has no rales. He exhibits no tenderness.  Abdominal: Soft. Bowel sounds are normal. He exhibits no distension and no mass. There is no tenderness.  Musculoskeletal: He exhibits no edema.  Lymphadenopathy:    He has cervical adenopathy (no axillary adenopathy).  Neurological: He is alert.  Skin: Skin is warm and dry. He is not diaphoretic.          Assessment & Plan:

## 2014-03-16 ENCOUNTER — Ambulatory Visit (INDEPENDENT_AMBULATORY_CARE_PROVIDER_SITE_OTHER): Payer: Medicare Other | Admitting: Cardiovascular Disease

## 2014-03-16 ENCOUNTER — Encounter: Payer: Self-pay | Admitting: Cardiovascular Disease

## 2014-03-16 VITALS — BP 118/60 | HR 45 | Ht 75.0 in | Wt 157.0 lb

## 2014-03-16 DIAGNOSIS — I4891 Unspecified atrial fibrillation: Secondary | ICD-10-CM

## 2014-03-16 DIAGNOSIS — K219 Gastro-esophageal reflux disease without esophagitis: Secondary | ICD-10-CM

## 2014-03-16 DIAGNOSIS — I2510 Atherosclerotic heart disease of native coronary artery without angina pectoris: Secondary | ICD-10-CM

## 2014-03-16 DIAGNOSIS — I480 Paroxysmal atrial fibrillation: Secondary | ICD-10-CM

## 2014-03-16 DIAGNOSIS — E782 Mixed hyperlipidemia: Secondary | ICD-10-CM

## 2014-03-16 NOTE — Assessment & Plan Note (Signed)
Converted but has SSS with bradycardia  Stop Toprol  Continue Eliquis F/U next available to see if HR has improved

## 2014-03-16 NOTE — Patient Instructions (Addendum)
Your physician has recommended you make the following change in your medication:  1) Stop Metoprolol  Your physician recommends that you schedule a follow-up appointment at next available with Dr. Johnsie Cancel.

## 2014-03-16 NOTE — Assessment & Plan Note (Signed)
Improved continue protonix  F/U GI

## 2014-03-16 NOTE — Progress Notes (Signed)
Patient ID: [name], male   DOB: May 16, 1930, 80 y.o.   MRN: [id number] [name] is an 80 y/o M with history of CAD s/p CABGx3 in 6301 complicated by post-op atrial fibrillation, hyperlipidemia, GERD   He has history of AF after CABG treated with Multaq but had converted to NSR and Coumadin was discontinued in 04/2010.  Hospitalized in January 2015 with nausea and GI illness Afib at time d/c with Eliquis , cardizem beta blocker  and Multaq  Today ECG shows SB rate 45   Some fatigue On Protonix and GI symptoms improved  Weight still down    ROS: Denies fever, malais, weight loss, blurry vision, decreased visual acuity, cough, sputum, SOB, hemoptysis, pleuritic pain, palpitaitons, heartburn, abdominal pain, melena, lower extremity edema, claudication, or rash.  All other systems reviewed and negative  General: Affect appropriate Healthy:  appears stated age 80: normal Neck supple with no adenopathy JVP normal no bruits no thyromegaly Lungs clear with no wheezing and good diaphragmatic motion Heart:  S1/S2 no murmur, no rub, gallop or click PMI normal Abdomen: benighn, BS positve, no tenderness, no AAA no bruit.  No HSM or HJR Distal pulses intact with no bruits No edema Neuro non-focal Skin warm and dry No muscular weakness   Current Outpatient Prescriptions  Medication Sig Dispense Refill  . apixaban (ELIQUIS) 5 MG TABS tablet Take 1 tablet (5 mg total) by mouth 2 (two) times daily.  60 tablet  11  . aspirin EC 81 MG tablet Take 1 tablet (81 mg total) by mouth daily.      . Calcium Carbonate Antacid (TUMS PO) Take 1 tablet by mouth 2 (two) times daily as needed. For calcium       . cholecalciferol (VITAMIN D) 1000 UNITS tablet Take 1,000 Units by mouth every morning.        . diltiazem (CARDIZEM CD) 180 MG 24 hr capsule       . dronedarone (MULTAQ) 400 MG tablet Take 1 tablet (400 mg total) by mouth 2 (two) times daily with a meal.  60 tablet  11  . FIBER PO Take 2 capsules by mouth  every morning.        . folic acid (FOLVITE) 1 MG tablet TAKE 1 TABLET ONCE DAILY.  30 tablet  3  . metoprolol (LOPRESSOR) 50 MG tablet       . metoprolol succinate (TOPROL-XL) 25 MG 24 hr tablet Take 25 mg by mouth 2 (two) times daily.      . Multiple Vitamin (MULITIVITAMIN WITH MINERALS) TABS Take 1 tablet by mouth daily.        . nitroGLYCERIN (NITROSTAT) 0.4 MG SL tablet Place 1 tablet (0.4 mg total) under the tongue every 5 (five) minutes as needed for chest pain.  30 tablet  0  . pantoprazole (PROTONIX) 40 MG tablet       . rosuvastatin (CRESTOR) 10 MG tablet Take 10 mg by mouth daily.       No current facility-administered medications for this visit.    Allergies  Amoxicillin  Electrocardiogram:  01/29/14  Afib rate 94    Today SB rate 45 [id number]   Assessment and Plan

## 2014-03-16 NOTE — Assessment & Plan Note (Signed)
Cholesterol is at goal.  Continue current dose of statin and diet Rx.  No myalgias or side effects.  F/U  LFT's in 6 months. Lab Results  Component Value Date   LDLCALC 46 05/20/2010  F/U labs with primary

## 2014-03-16 NOTE — Assessment & Plan Note (Signed)
Stable with no angina and good activity level.  Continue medical Rx  81 mg ASA with coumadin

## 2014-04-07 ENCOUNTER — Encounter: Payer: Self-pay | Admitting: Cardiovascular Disease

## 2014-04-07 ENCOUNTER — Ambulatory Visit (INDEPENDENT_AMBULATORY_CARE_PROVIDER_SITE_OTHER): Payer: Medicare Other | Admitting: Cardiovascular Disease

## 2014-04-07 VITALS — BP 108/64 | HR 70 | Ht 75.0 in | Wt 156.0 lb

## 2014-04-07 DIAGNOSIS — I480 Paroxysmal atrial fibrillation: Secondary | ICD-10-CM

## 2014-04-07 DIAGNOSIS — I2510 Atherosclerotic heart disease of native coronary artery without angina pectoris: Secondary | ICD-10-CM

## 2014-04-07 DIAGNOSIS — K589 Irritable bowel syndrome without diarrhea: Secondary | ICD-10-CM

## 2014-04-07 DIAGNOSIS — I4891 Unspecified atrial fibrillation: Secondary | ICD-10-CM

## 2014-04-07 NOTE — Assessment & Plan Note (Signed)
Stable with no angina and good activity level.  Continue medical Rx  

## 2014-04-07 NOTE — Patient Instructions (Signed)
Your physician wants you to follow-up in:  6 MONTHS WITH DR NISHAN  You will receive a reminder letter in the mail two months in advance. If you don't receive a letter, please call our office to schedule the follow-up appointment. Your physician recommends that you continue on your current medications as directed. Please refer to the Current Medication list given to you today. 

## 2014-04-07 NOTE — Progress Notes (Signed)
Patient ID: [name], male   DOB: 06/29/1930, 80 y.o.   MRN: [id number] [name] is an 80 y/o M with history of CAD s/p CABGx3 in 9528 complicated by post-op atrial fibrillation, hyperlipidemia, GERD He has history of AF after CABG treated with Multaq but had converted to NSR and Coumadin was discontinued in 04/2010. Hospitalized in January 2015 with nausea and GI illness Afib at time d/c with Eliquis , cardizem beta blocker and Multaq 4/15  ECG shows SB rate 45 Some fatigue  Toprol stopped  On Protonix and GI symptoms persist on probiotic now    ROS: Denies fever, malais, weight loss, blurry vision, decreased visual acuity, cough, sputum, SOB, hemoptysis, pleuritic pain, palpitaitons, heartburn, abdominal pain, melena, lower extremity edema, claudication, or rash.  All other systems reviewed and negative  General: Affect appropriate Healthy:  appears stated age 80: normal Neck supple with no adenopathy JVP normal no bruits no thyromegaly Lungs clear with no wheezing and good diaphragmatic motion Heart:  S1/S2 no murmur, no rub, gallop or click PMI normal Abdomen: benighn, BS positve, no tenderness, no AAA no bruit.  No HSM or HJR Distal pulses intact with no bruits No edema Neuro non-focal Skin warm and dry No muscular weakness   Current Outpatient Prescriptions  Medication Sig Dispense Refill  . apixaban (ELIQUIS) 5 MG TABS tablet Take 1 tablet (5 mg total) by mouth 2 (two) times daily.  60 tablet  11  . aspirin EC 81 MG tablet Take 1 tablet (81 mg total) by mouth daily.      . Calcium Carbonate Antacid (TUMS PO) Take 1 tablet by mouth 2 (two) times daily as needed. For calcium       . cholecalciferol (VITAMIN D) 1000 UNITS tablet Take 1,000 Units by mouth every morning.        . dronedarone (MULTAQ) 400 MG tablet Take 1 tablet (400 mg total) by mouth 2 (two) times daily with a meal.  60 tablet  11  . folic acid (FOLVITE) 1 MG tablet TAKE 1 TABLET ONCE DAILY.  30 tablet  3  .  Multiple Vitamin (MULITIVITAMIN WITH MINERALS) TABS Take 1 tablet by mouth daily.        . nitroGLYCERIN (NITROSTAT) 0.4 MG SL tablet Place 1 tablet (0.4 mg total) under the tongue every 5 (five) minutes as needed for chest pain.  30 tablet  0  . pantoprazole (PROTONIX) 40 MG tablet       . Probiotic Product (ALIGN) 4 MG CAPS Take by mouth.      . rosuvastatin (CRESTOR) 10 MG tablet Take 10 mg by mouth daily.       No current facility-administered medications for this visit.    Allergies  Amoxicillin  Electrocardiogram:  4/20 SR rate 45  [id number]   Assessment and Plan

## 2014-04-07 NOTE — Assessment & Plan Note (Signed)
Maint NSR  HR much improved off Toprol continue Eliquis and multaq

## 2014-04-07 NOTE — Assessment & Plan Note (Signed)
Seems like his biggest issue is loose stools and IBS  Continue probiotics

## 2014-04-10 ENCOUNTER — Encounter: Payer: Self-pay | Admitting: Gastroenterology

## 2014-04-14 ENCOUNTER — Other Ambulatory Visit: Payer: Self-pay

## 2014-04-14 MED ORDER — PANTOPRAZOLE SODIUM 40 MG PO TBEC: 40.0000 mg | DELAYED_RELEASE_TABLET | Freq: Two times a day (BID) | ORAL | Status: DC

## 2014-05-05 ENCOUNTER — Other Ambulatory Visit: Payer: Self-pay | Admitting: Cardiovascular Disease

## 2014-06-09 ENCOUNTER — Other Ambulatory Visit: Payer: Self-pay | Admitting: *Deleted

## 2014-06-09 MED ORDER — FOLIC ACID 1 MG PO TABS: ORAL_TABLET | ORAL | Status: DC

## 2014-06-15 ENCOUNTER — Ambulatory Visit (INDEPENDENT_AMBULATORY_CARE_PROVIDER_SITE_OTHER): Payer: Medicare Other | Admitting: Gastroenterology

## 2014-06-15 ENCOUNTER — Encounter: Payer: Self-pay | Admitting: Gastroenterology

## 2014-06-15 VITALS — BP 110/60 | HR 64 | Ht 73.0 in | Wt 158.1 lb

## 2014-06-15 DIAGNOSIS — M1990 Unspecified osteoarthritis, unspecified site: Secondary | ICD-10-CM | POA: Insufficient documentation

## 2014-06-15 DIAGNOSIS — R001 Bradycardia, unspecified: Secondary | ICD-10-CM | POA: Insufficient documentation

## 2014-06-15 DIAGNOSIS — K5289 Other specified noninfective gastroenteritis and colitis: Secondary | ICD-10-CM

## 2014-06-15 DIAGNOSIS — K529 Noninfective gastroenteritis and colitis, unspecified: Secondary | ICD-10-CM

## 2014-06-15 MED ORDER — ONDANSETRON HCL 4 MG PO TABS: 4.0000 mg | ORAL_TABLET | Freq: Three times a day (TID) | ORAL | Status: DC | PRN

## 2014-06-15 NOTE — Progress Notes (Signed)
_                                                                                                                History of Present Illness:  Pleasant 80 year old white male with history of coronary artery disease, atrial fibrillation, on Eliquis, here for evaluation of nausea, vomiting diarrhea.  In January, 2015 he was hospitalized with dehydration and protracted nausea and vomiting.  Exact etiology wasn't determined.  This was followed by several weeks of diarrhea but finally subsided with the help of a probiotic.  He had a similar episode in January, 2014.  In between he has felt well.  He has requested a medicine for nausea if this should recur.  At the present time he has no GI complaints.  Colonoscopy in 2012 was normal.    Past Medical History  Diagnosis Date  . Mixed hyperlipidemia   . UNSPECIFIED ANEMIA   . CAD     a. 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  [name] Atrial fibrillation   . PREMATURE VENTRICULAR CONTRACTIONS     a. After CABG - was on Coumadin/Multaq for a period of time. Coumadin discontinued 04/2010 after maintaining NSR.  [name] Esophageal reflux   . IBS   . HYPERPLASIA, PROSTATE NOS W/URINARY OBST/LUTS   . LUMBAR RADICULOPATHY, RIGHT   . OSTEOPOROSIS   . INGUINAL HERNIA   . Lichen planus [id number]    [name]  . Diverticulosis 2011    Diverticulitis 2004  . PONV (postoperative nausea and vomiting)    Past Surgical History  Procedure Laterality Date  . Knee surgery Right 1977    repair  . Hip fracture surgery Right 03/2007    Dr. Salvadore Farber  . Inguinal hernia repair Left 2012    Dr Brantley Stage  . Coronary artery bypass graft      2 VD;anomalous RCA;post op compliacted by AF  . Colonoscopy  2011    Diverticulosis; Dr Deatra Ina  . Prostate biopsy  09/19/2013    [facility]  . Cataract extraction Right 10/06/2013   family history includes Breast cancer in his sister;  Heart disease in his father; Heart disease (age of onset: 43) in his sister; Stroke in his mother. Current Outpatient Prescriptions  Medication Sig Dispense Refill  . apixaban (ELIQUIS) 5 MG TABS tablet Take 1 tablet (5 mg total) by mouth 2 (two) times daily.  60 tablet  11  . aspirin EC 81 MG tablet Take 1 tablet (81 mg total) by mouth daily.      . Calcium Carbonate Antacid (TUMS PO) Take 1 tablet by mouth 2 (two) times daily as needed. For calcium       . cholecalciferol (VITAMIN D) 1000 UNITS tablet Take 1,000 Units by mouth every morning.        [name] CRESTOR 10 MG tablet TAKE 1 TABLET ONCE DAILY.  30 tablet  3  . dronedarone (MULTAQ) 400 MG  tablet Take 1 tablet (400 mg total) by mouth 2 (two) times daily with a meal.  60 tablet  11  . folic acid (FOLVITE) 1 MG tablet TAKE 1 TABLET ONCE DAILY.  30 tablet  3  . Multiple Vitamin (MULITIVITAMIN WITH MINERALS) TABS Take 1 tablet by mouth daily.        . nitroGLYCERIN (NITROSTAT) 0.4 MG SL tablet Place 1 tablet (0.4 mg total) under the tongue every 5 (five) minutes as needed for chest pain.  30 tablet  0  . pantoprazole (PROTONIX) 40 MG tablet Take 1 tablet (40 mg total) by mouth 2 (two) times daily.  60 tablet  5  . Probiotic Product (PROBIOTIC PO) Take 1 tablet by mouth every other day. [place] 3 billion       No current facility-administered medications for this visit.   Allergies as of 06/15/2014 - Review Complete 06/15/2014  Allergen Reaction Noted  . Amoxicillin Other (See Comments) 03/29/2010    reports that he quit smoking about 51 years ago. His smoking use included Cigarettes. He smoked 0.00 packs per day. He has never used smokeless tobacco. He reports that he drinks alcohol. He reports that he does not use illicit drugs.     Review of Systems: Pertinent positive and negative review of systems were noted in the above HPI section. All other review of systems were otherwise negative.  Vital signs were reviewed in today's  medical record Physical Exam: General: Well developed , well nourished, no acute distress Skin: anicteric Head: Normocephalic and atraumatic Eyes:  sclerae anicteric, EOMI Ears: Normal auditory acuity Mouth: No deformity or lesions Neck: Supple, no masses or thyromegaly Lungs: Clear throughout to auscultation Heart: Regular rate and rhythm; no murmurs, rubs or bruits Abdomen: Soft, non tender and non distended. No masses, hepatosplenomegaly or hernias noted. Normal Bowel sounds Rectal:deferred Musculoskeletal: Symmetrical with no gross deformities  Skin: No lesions on visible extremities Pulses:  Normal pulses noted Extremities: No clubbing, cyanosis, edema or deformities noted Neurological: Alert oriented x 4, grossly nonfocal Cervical Nodes:  No significant cervical adenopathy Inguinal Nodes: No significant inguinal adenopathy Psychological:  Alert and cooperative. Normal mood and affect  See Assessment and Plan under Problem List

## 2014-06-15 NOTE — Assessment & Plan Note (Signed)
Patient symptoms certainly suggest gastroenteritis.  There is no specific pattern and he is entirely recovered.  I do not think he requires any GI workup at this time.  I will prescribe Zofran when necessary

## 2014-06-15 NOTE — Patient Instructions (Signed)
Follow up as needed

## 2014-08-06 ENCOUNTER — Ambulatory Visit (INDEPENDENT_AMBULATORY_CARE_PROVIDER_SITE_OTHER): Payer: Medicare Other | Admitting: Internal Medicine

## 2014-08-06 ENCOUNTER — Encounter: Payer: Self-pay | Admitting: Internal Medicine

## 2014-08-06 ENCOUNTER — Other Ambulatory Visit (INDEPENDENT_AMBULATORY_CARE_PROVIDER_SITE_OTHER): Payer: Medicare Other

## 2014-08-06 VITALS — BP 110/60 | HR 56 | Temp 97.9°F | Wt 156.0 lb

## 2014-08-06 DIAGNOSIS — R002 Palpitations: Secondary | ICD-10-CM

## 2014-08-06 DIAGNOSIS — F4322 Adjustment disorder with anxiety: Secondary | ICD-10-CM

## 2014-08-06 LAB — BASIC METABOLIC PANEL
BUN: 14 mg/dL (ref 6–23)
CALCIUM: 10.1 mg/dL (ref 8.4–10.5)
CO2: 29 mEq/L (ref 19–32)
Chloride: 101 mEq/L (ref 96–112)
Creatinine, Ser: 1 mg/dL (ref 0.4–1.5)
GFR: 76.95 mL/min (ref >60.00)
Glucose, Bld: 83 mg/dL (ref 70–99)
POTASSIUM: 4.3 meq/L (ref 3.5–5.1)
SODIUM: 138 meq/L (ref 135–145)

## 2014-08-06 LAB — MAGNESIUM: MAGNESIUM: 2.1 mg/dL (ref 1.5–2.5)

## 2014-08-06 LAB — TSH: TSH: 2 u[IU]/mL (ref 0.35–4.50)

## 2014-08-06 MED ORDER — SERTRALINE HCL 25 MG PO TABS: 25.0000 mg | ORAL_TABLET | Freq: Every day | ORAL | Status: DC

## 2014-08-06 NOTE — Progress Notes (Signed)
   Subjective:    Patient ID: [name], male    DOB: 08/20/1930, 80 y.o.   MRN: [id number]  HPI  He awoke from a dream of being chased with a pounding heart which lasted 10 minutes. No other associated neuro/cardiac symptoms with the event.  Over the next 2-3 hours he had some transient lightheadedness and dizziness.  This induced some anxiety about his health and potential need for a higher level care management. His wife is in the memory assisted unit.    Review of Systems  Pertinent negative or absent signs and symptoms are as follows: Constitutional: No significant change in weight; significant fatigue; sleep disorder; change in appetite. Eye: no blurred, double vision ,loss of vision ENT/GI: no constipation; diarrhea;hoarseness;dysphagia Derm: no change in nails,hair,skin Neuro: no numbness or tingling; tremor Endo: no temperature intolerance to heat ,cold       Objective:   Physical Exam   Positive pertinent findings include: He has pattern alopecia. There is anisocoria with left pupil greater than the right. He has minimal  Lateral scleritis on the left. S1-S2 are increased in intensity. He has mild OA DIP changes of the hands.   Gen.:  well-nourished; in no acute distress Eyes: Extraocular motion intact; no lid lag or proptosis ,nystagmus Neck: full ROM; no masses ; thyroid normal  Heart: Normal rhythm and rate without significant murmur, gallop, or extra heart sounds Lungs: Chest clear to auscultation without rales,rales, wheezes Neuro:Deep tendon reflexes are equal and within normal limits; no tremor  Skin: Warm and dry without significant lesions or rashes; no onycholysis Lymphatic: no cervical or axillary LA Psych: Normally communicative and interactive; no abnormal mood or affect clinically.           Assessment & Plan:  #1 palpitations #2 anxiety See orders

## 2014-08-06 NOTE — Progress Notes (Signed)
Pre visit review using our clinic review tool, if applicable. No additional management support is needed unless otherwise documented below in the visit note. 

## 2014-08-06 NOTE — Patient Instructions (Signed)
Your next office appointment will be determined based upon review of your pending labs . Those instructions will be transmitted to you through My Chart . Followup as needed for your acute issue. Please report any significant change in your symptoms.   To prevent palpitations or premature beats, avoid stimulants such as decongestants, diet pills, nicotine, or caffeine (coffee, tea, cola, or chocolate) to excess.

## 2014-08-08 ENCOUNTER — Telehealth: Payer: Self-pay

## 2014-08-08 NOTE — Telephone Encounter (Signed)
LVM for pt to call back and schedule AWV.

## 2014-09-10 ENCOUNTER — Other Ambulatory Visit: Payer: Self-pay | Admitting: Cardiovascular Disease

## 2014-10-07 ENCOUNTER — Other Ambulatory Visit: Payer: Self-pay | Admitting: Cardiovascular Disease

## 2014-10-25 NOTE — Progress Notes (Signed)
Patient ID: [name], male   DOB: 06/25/1930, 80 y.o.   MRN: [id number] [name] is an 80 y/o M with history of CAD s/p CABGx3 in 1771 complicated by post-op atrial fibrillation, hyperlipidemia, GERD He has history of AF after CABG treated with Multaq but had converted to NSR and Coumadin was discontinued in 04/2010. Hospitalized in January 2015 with nausea and GI illness Afib at time d/c with Eliquis , cardizem beta blocker and Multaq 4/15 ECG shows SB rate 45 Some fatigue Toprol stopped 4/15  HR improved  On Protonix and GI symptoms persist on probiotic now   ROS: Denies fever, malais, weight loss, blurry vision, decreased visual acuity, cough, sputum, SOB, hemoptysis, pleuritic pain, palpitaitons, heartburn, abdominal pain, melena, lower extremity edema, claudication, or rash.  All other systems reviewed and negative  General: Affect appropriate Healthy:  appears stated age 17: normal Neck supple with no adenopathy JVP normal no bruits no thyromegaly Lungs clear with no wheezing and good diaphragmatic motion Heart:  S1/S2 no murmur, no rub, gallop or click PMI normal Abdomen: benighn, BS positve, no tenderness, no AAA no bruit.  No HSM or HJR Distal pulses intact with no bruits No edema Neuro non-focal Skin warm and dry No muscular weakness   Current Outpatient Prescriptions  Medication Sig Dispense Refill  . apixaban (ELIQUIS) 5 MG TABS tablet Take 1 tablet (5 mg total) by mouth 2 (two) times daily. 60 tablet 11  . aspirin EC 81 MG tablet Take 1 tablet (81 mg total) by mouth daily.    . Calcium Carbonate Antacid (TUMS PO) Take 1 tablet by mouth 2 (two) times daily as needed. For calcium     . cholecalciferol (VITAMIN D) 1000 UNITS tablet Take 1,000 Units by mouth every morning.      [name] CRESTOR 10 MG tablet TAKE 1 TABLET ONCE DAILY. 30 tablet 5  . dronedarone (MULTAQ) 400 MG tablet Take 1 tablet (400 mg total) by mouth 2 (two) times daily with a meal. 60 tablet 11  . folic  acid (FOLVITE) 1 MG tablet TAKE 1 TABLET ONCE DAILY. 30 tablet 0  . Multiple Vitamin (MULITIVITAMIN WITH MINERALS) TABS Take 1 tablet by mouth daily.      . nitroGLYCERIN (NITROSTAT) 0.4 MG SL tablet Place 1 tablet (0.4 mg total) under the tongue every 5 (five) minutes as needed for chest pain. 30 tablet 0  . ondansetron (ZOFRAN) 4 MG tablet Take 1 tablet (4 mg total) by mouth every 8 (eight) hours as needed for nausea or vomiting. 30 tablet 1  . pantoprazole (PROTONIX) 40 MG tablet Take 1 tablet (40 mg total) by mouth 2 (two) times daily. (Patient taking differently: Take 40 mg by mouth as needed. ) 60 tablet 5  . Probiotic Product (PROBIOTIC PO) Take 1 tablet by mouth every other day. [place] 3 billion     No current facility-administered medications for this visit.    Allergies  Amoxicillin  Electrocardiogram:  4/15  SB rate 45  ICRBBB  Toprol stopped  Assessment and Plan

## 2014-10-26 ENCOUNTER — Encounter: Payer: Self-pay | Admitting: Cardiovascular Disease

## 2014-10-26 ENCOUNTER — Ambulatory Visit (INDEPENDENT_AMBULATORY_CARE_PROVIDER_SITE_OTHER): Payer: Medicare Other | Admitting: Cardiovascular Disease

## 2014-10-26 DIAGNOSIS — I2510 Atherosclerotic heart disease of native coronary artery without angina pectoris: Secondary | ICD-10-CM

## 2014-10-26 MED ORDER — ROSUVASTATIN CALCIUM 10 MG PO TABS: 10.0000 mg | ORAL_TABLET | Freq: Every day | ORAL | Status: DC

## 2014-10-26 MED ORDER — FOLIC ACID 1 MG PO TABS: 1.0000 mg | ORAL_TABLET | Freq: Every day | ORAL | Status: DC

## 2014-10-26 NOTE — Patient Instructions (Signed)
Your physician wants you to follow-up in:   [place] will receive a reminder letter in the mail two months in advance. If you don't receive a letter, please call our office to schedule the follow-up appointment. Your physician recommends that you continue on your current medications as directed. Please refer to the Current Medication list given to you today. Your physician recommends that you return for lab work in:  [place]

## 2014-10-26 NOTE — Assessment & Plan Note (Signed)
Cholesterol is at goal.  Continue current dose of statin and diet Rx.  No myalgias or side effects.  F/U  LFT's in 6 months. Lab Results  Component Value Date   [facility] 46 05/20/2010   Labs with Dr Larose Kells  In [facility] lab

## 2014-10-26 NOTE — Assessment & Plan Note (Signed)
No palpitations continue multaq and eliquis

## 2014-10-26 NOTE — Assessment & Plan Note (Signed)
Stable with no angina and good activity level.  Continue medical Rx  

## 2014-10-27 ENCOUNTER — Other Ambulatory Visit (INDEPENDENT_AMBULATORY_CARE_PROVIDER_SITE_OTHER): Payer: Medicare Other

## 2014-10-27 DIAGNOSIS — E785 Hyperlipidemia, unspecified: Secondary | ICD-10-CM

## 2014-10-27 LAB — HEPATIC FUNCTION PANEL
ALK PHOS: 48 U/L (ref 39–117)
ALT: 19 U/L (ref 0–53)
AST: 22 U/L (ref 0–37)
Albumin: 4.4 g/dL (ref 3.5–5.2)
BILIRUBIN DIRECT: 0 mg/dL (ref 0.0–0.3)
TOTAL PROTEIN: 7.2 g/dL (ref 6.0–8.3)
Total Bilirubin: 0.6 mg/dL (ref 0.2–1.2)

## 2014-10-27 LAB — LIPID PANEL
Cholesterol: 116 mg/dL (ref 0–200)
HDL: 50.7 mg/dL
LDL Cholesterol: 46 mg/dL (ref 0–99)
NonHDL: 65.3
Total CHOL/HDL Ratio: 2
Triglycerides: 95 mg/dL (ref 0.0–149.0)
VLDL: 19 mg/dL (ref 0.0–40.0)

## 2014-12-17 ENCOUNTER — Telehealth: Payer: Self-pay | Admitting: *Deleted

## 2014-12-17 NOTE — Telephone Encounter (Signed)
Can change to generic lipitor 40 mg daily

## 2014-12-17 NOTE — Telephone Encounter (Addendum)
RECEIVED  FAX  FROM  [place]  PT  WOULD  LIKE  DIFFER MED DUE TO  CRESTOR  RAISING TO  $45.00  A  MONTH  AND  WAS RUNNING  $10.00 WILL FORWARD MESSAGE TO  DR Johnsie Cancel FOR  RECOMMENDATIONS./CY

## 2014-12-17 NOTE — Telephone Encounter (Signed)
ERROR

## 2014-12-22 MED ORDER — ATORVASTATIN CALCIUM 40 MG PO TABS: 40.0000 mg | ORAL_TABLET | Freq: Every day | ORAL | Status: DC

## 2014-12-22 NOTE — Telephone Encounter (Signed)
PT  AWARE  AND  WILL  NEED TO RECHECK   LIPID LIVER  IN  3 MONTHS .Adonis Housekeeper

## 2015-01-24 ENCOUNTER — Other Ambulatory Visit: Payer: Self-pay | Admitting: Cardiovascular Disease

## 2015-01-25 ENCOUNTER — Ambulatory Visit (INDEPENDENT_AMBULATORY_CARE_PROVIDER_SITE_OTHER): Payer: Medicare Other | Admitting: Internal Medicine

## 2015-01-25 ENCOUNTER — Encounter: Payer: Self-pay | Admitting: Internal Medicine

## 2015-01-25 VITALS — BP 108/64 | HR 59 | Temp 97.9°F | Ht 74.0 in | Wt 162.0 lb

## 2015-01-25 DIAGNOSIS — M25512 Pain in left shoulder: Secondary | ICD-10-CM

## 2015-01-25 MED ORDER — PREDNISONE 10 MG PO TABS: ORAL_TABLET | ORAL | Status: DC

## 2015-01-25 MED ORDER — ROSUVASTATIN CALCIUM 10 MG PO TABS: 10.0000 mg | ORAL_TABLET | Freq: Every day | ORAL | Status: DC

## 2015-01-25 NOTE — Progress Notes (Signed)
Pre visit review using our clinic review tool, if applicable. No additional management support is needed unless otherwise documented below in the visit note. 

## 2015-01-25 NOTE — Progress Notes (Signed)
Subjective:    Patient ID: [name], male    DOB: 08-23-1930, 81 y.o.   MRN: [id number]  DOS:  01/25/2015 Type of visit - description : acute Interval history: Complaining of pain at the left arm. Symptoms started 4 weeks ago with pain at the left thumb, then it  moved to the left elbow and finally the shoulder. Currently pain is concentrated around the shoulder and is worse when he moves his arms above the shoulder level. Due to cost, was changed from Crestor to Lipitor about the same time the symptoms started.  Denies any fall, injury. No swelling in the upper extremity. No substernal chest pain. No left arm exertional symptoms. No actual neck pain. Gait at baseline    Review of Systems See  history of present illness  Past Medical History  Diagnosis Date  . Mixed hyperlipidemia   . UNSPECIFIED ANEMIA   . CAD     a. 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  [name] Atrial fibrillation   . PREMATURE VENTRICULAR CONTRACTIONS     a. After CABG - was on Coumadin/Multaq for a period of time. Coumadin discontinued 04/2010 after maintaining NSR.  [name] Esophageal reflux   . IBS   . HYPERPLASIA, PROSTATE NOS W/URINARY OBST/LUTS   . LUMBAR RADICULOPATHY, RIGHT   . OSTEOPOROSIS   . INGUINAL HERNIA   . Lichen planus [id number]    [name]  . Diverticulosis 2011    Diverticulitis 2004  . PONV (postoperative nausea and vomiting)     Past Surgical History  Procedure Laterality Date  . Knee surgery Right 1977    repair  . Hip fracture surgery Right 03/2007    Dr. Salvadore Farber  . Inguinal hernia repair Left 2012    Dr Brantley Stage  . Coronary artery bypass graft      2 VD;anomalous RCA;post op compliacted by AF  . Colonoscopy  2011    Diverticulosis; Dr Deatra Ina  . Prostate biopsy  09/19/2013    [facility]  . Cataract extraction Right 10/06/2013    History   Social History  . Marital Status: Married   Spouse Name: N/A  . Number of Children: 0  . Years of Education: N/A   Occupational History  . retired    Social History Main Topics  . Smoking status: Former Smoker    Types: Cigarettes    Quit date: 11/27/1962  . Smokeless tobacco: Never Used  . Alcohol Use: Yes     Comment: 2 oz wine/ night  . Drug Use: No  . Sexual Activity: Not Currently   Other Topics Concern  . Not on file   Social History Narrative        Medication List       This list is accurate as of: 01/25/15 11:59 PM.  Always use your most recent med list.               aspirin EC 81 MG tablet  Take 1 tablet (81 mg total) by mouth daily.     cholecalciferol 1000 UNITS tablet  Commonly known as:  VITAMIN D  Take 1,000 Units by mouth every morning.     ELIQUIS 5 MG Tabs tablet  Generic drug:  apixaban  TAKE ONE (1) TABLET BY MOUTH TWO (2) TIMES DAILY     folic acid 1 MG tablet  Commonly known as:  FOLVITE  Take  1 tablet (1 mg total) by mouth daily.     MULTAQ 400 MG tablet  Generic drug:  dronedarone  TAKE ONE (1) TABLET BY MOUTH TWO (2) TIMES DAILY WITH A MEAL     multivitamin with minerals Tabs tablet  Take 1 tablet by mouth daily.     nitroGLYCERIN 0.4 MG SL tablet  Commonly known as:  NITROSTAT  Place 1 tablet (0.4 mg total) under the tongue every 5 (five) minutes as needed for chest pain.     pantoprazole 40 MG tablet  Commonly known as:  PROTONIX  Take 1 tablet (40 mg total) by mouth 2 (two) times daily.     predniSONE 10 MG tablet  Commonly known as:  DELTASONE  4 tablets x 2 days, 3 tabs x 2 days, 2 tabs x 2 days, 1 tab x 2 days     PROBIOTIC PO  Take 1 tablet by mouth every other day. [place] 3 billion     rosuvastatin 10 MG tablet  Commonly known as:  CRESTOR  Take 1 tablet (10 mg total) by mouth daily.     TUMS PO  Take 1 tablet by mouth 2 (two) times daily as needed. For calcium           Objective:   Physical Exam BP 108/64 mmHg  Pulse 59  Temp(Src)  97.9 F (36.6 C) (Oral)  Ht 6\' 2"  (1.88 m)  Wt 162 lb (73.483 kg)  BMI 20.79 kg/m2  SpO2 98%  General:   Well developed, well nourished . NAD.  Neck : No TTP, range of motion is normal. Muscle skeletal: Right shoulder normal Left shoulder: Slightly tender to palpation anteriorly, no deformities. Range of motion is normal, some pain elicited when the arm is elevated above the shoulder level. Skin: Not pale. Not jaundice Neurologic:  alert & oriented X3.  Speech normal, gait appropriate for age and unassisted DTRs symmetric Psych--  Cognition and judgment appear intact.  Cooperative with normal attention span and concentration.  Behavior appropriate. No anxious or depressed appearing.       Assessment & Plan:    Left shoulder pain Although the pain is started at the hand and elbow at this time most of the pain is around the left shoulder, I suspect he has primarily a shoulder pain. Rotator cuff injury? Neurological exam normal, no exertional symptoms. Symptoms coincide with the switch from Crestor to Lipitor and he would like to go back on Crestor and he thinks the new medication may be causing the pain (I'm not sure). Plan: Anti-inflammatory treatment with prednisone, watch CBGs Switch back to Crestor Return to the office one month Avoid Motrin, try Tylenol instead

## 2015-01-25 NOTE — Patient Instructions (Signed)
For pain: Take prednisone as prescribed for few days Take Tylenol as needed Check your blood sugar daily, if the blood sugar goes more than 200 while taking prednisone : call the office  Stop Lipitor, go back on Crestor  Next visit in one month for a checkup

## 2015-02-23 ENCOUNTER — Encounter: Payer: Self-pay | Admitting: Medical

## 2015-02-23 ENCOUNTER — Telehealth: Payer: Self-pay | Admitting: Medical

## 2015-02-23 ENCOUNTER — Ambulatory Visit: Payer: Medicare Other | Admitting: Internal Medicine

## 2015-02-23 ENCOUNTER — Ambulatory Visit (INDEPENDENT_AMBULATORY_CARE_PROVIDER_SITE_OTHER): Payer: Medicare Other | Admitting: Medical

## 2015-02-23 VITALS — BP 124/67 | HR 62 | Temp 98.0°F | Ht 74.0 in | Wt 158.8 lb

## 2015-02-23 DIAGNOSIS — R519 Headache, unspecified: Secondary | ICD-10-CM | POA: Insufficient documentation

## 2015-02-23 DIAGNOSIS — J0110 Acute frontal sinusitis, unspecified: Secondary | ICD-10-CM

## 2015-02-23 DIAGNOSIS — G44209 Tension-type headache, unspecified, not intractable: Secondary | ICD-10-CM | POA: Diagnosis not present

## 2015-02-23 DIAGNOSIS — R51 Headache: Secondary | ICD-10-CM

## 2015-02-23 LAB — SEDIMENTATION RATE: SED RATE: 23 mm/h — AB (ref 0–22)

## 2015-02-23 MED ORDER — DOXYCYCLINE HYCLATE 100 MG PO TABS: 100.0000 mg | ORAL_TABLET | Freq: Two times a day (BID) | ORAL | Status: DC

## 2015-02-23 MED ORDER — BENZONATATE 100 MG PO CAPS: 100.0000 mg | ORAL_CAPSULE | Freq: Three times a day (TID) | ORAL | Status: DC | PRN

## 2015-02-23 MED ORDER — PREDNISONE 10 MG PO TABS: 10.0000 mg | ORAL_TABLET | Freq: Every day | ORAL | Status: DC

## 2015-02-23 NOTE — Progress Notes (Signed)
Subjective:    Patient ID: [name], male    DOB: 1930-06-04, 81 y.o.   MRN: [id number]  HPI   Pt in today that he some frontal area pain. More on the rt side. He states in the am the pressure sensation. After blowing his nose would feel better. Yesterday he blew a lot of mucous out and he reports had a very good day. Pt tried some tylenol sinus yesterday. Pt states that this started a couple of weeks ago.   On summary every day pressure in am. Relieved by early afternoon. And blowing nose getting mucous out often clears symptoms. Some sneezing recently not bad. Hx of some allergies when younger cutting grass in the fall in spring. He admits to some nasal congestion and occsional cough.   Also some mild rt trapezius pain. Noticed this am. Some exercise curling head in bed for months. Maybe rt temporal pain in addition to rt frontal region pain.   Review of Systems  Constitutional: Negative for fever, chills and fatigue.  HENT: Positive for congestion, postnasal drip, sinus pressure and sneezing. Negative for ear discharge.   Respiratory: Positive for cough. Negative for chest tightness, shortness of breath and wheezing.   Cardiovascular: Negative for chest pain and palpitations.  Gastrointestinal: Negative for abdominal pain.  Musculoskeletal: Negative for back pain.       Rt trapezius pain mild.  Neurological: Positive for dizziness and headaches. Negative for seizures, syncope, speech difficulty, weakness, light-headedness and numbness.       Maybe mild off balance compared to baseline he speculates. Then did very good heel to toe gait later on exam.  Maybe just edge of left temporal area. More complains sinus like pain/pressure.  Hematological: Negative for adenopathy. Does not bruise/bleed easily.  Psychiatric/Behavioral: Negative for confusion and decreased concentration.   Past Medical History  Diagnosis Date  . Mixed hyperlipidemia   . UNSPECIFIED ANEMIA   . CAD     a.  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  [name] Atrial fibrillation   . PREMATURE VENTRICULAR CONTRACTIONS     a. After CABG - was on Coumadin/Multaq for a period of time. Coumadin discontinued 04/2010 after maintaining NSR.  [name] Esophageal reflux   . IBS   . HYPERPLASIA, PROSTATE NOS W/URINARY OBST/LUTS   . LUMBAR RADICULOPATHY, RIGHT   . OSTEOPOROSIS   . INGUINAL HERNIA   . Lichen planus [id number]    [name]  . Diverticulosis 2011    Diverticulitis 2004  . PONV (postoperative nausea and vomiting)     History   Social History  . Marital Status: Married    Spouse Name: N/A  . Number of Children: 0  . Years of Education: N/A   Occupational History  . retired    Social History Main Topics  . Smoking status: Former Smoker    Types: Cigarettes    Quit date: 11/27/1962  . Smokeless tobacco: Never Used  . Alcohol Use: Yes     Comment: 2 oz wine/ night  . Drug Use: No  . Sexual Activity: Not Currently   Other Topics Concern  . Not on file   Social History Narrative    Past Surgical History  Procedure Laterality Date  . Knee surgery Right 1977    repair  . Hip fracture surgery Right 03/2007    Dr. Salvadore Farber  . Inguinal hernia repair Left 2012  Dr Brantley Stage  . Coronary artery bypass graft      2 VD;anomalous RCA;post op compliacted by AF  . Colonoscopy  2011    Diverticulosis; Dr Deatra Ina  . Prostate biopsy  09/19/2013    [facility]  . Cataract extraction Right 10/06/2013    Family History  Problem Relation Age of Onset  . Breast cancer Sister   . Stroke Mother   . Heart disease Father   . Heart disease Sister 86    Allergies  Allergen Reactions  . Amoxicillin Other (See Comments)    REACTION: n/v/d    Current Outpatient Prescriptions on File Prior to Visit  Medication Sig Dispense Refill  . aspirin EC 81 MG tablet Take 1 tablet (81 mg total) by mouth daily.    . Calcium  Carbonate Antacid (TUMS PO) Take 1 tablet by mouth 2 (two) times daily as needed. For calcium     . cholecalciferol (VITAMIN D) 1000 UNITS tablet Take 1,000 Units by mouth every morning.      [name] ELIQUIS 5 MG TABS tablet TAKE ONE (1) TABLET BY MOUTH TWO (2) TIMES DAILY 60 tablet 10  . folic acid (FOLVITE) 1 MG tablet Take 1 tablet (1 mg total) by mouth daily. 30 tablet 11  . MULTAQ 400 MG tablet TAKE ONE (1) TABLET BY MOUTH TWO (2) TIMES DAILY WITH A MEAL 60 tablet 10  . Multiple Vitamin (MULITIVITAMIN WITH MINERALS) TABS Take 1 tablet by mouth daily.      . nitroGLYCERIN (NITROSTAT) 0.4 MG SL tablet Place 1 tablet (0.4 mg total) under the tongue every 5 (five) minutes as needed for chest pain. 30 tablet 0  . pantoprazole (PROTONIX) 40 MG tablet Take 1 tablet (40 mg total) by mouth 2 (two) times daily. (Patient taking differently: Take 40 mg by mouth as needed. ) 60 tablet 5  . Probiotic Product (PROBIOTIC PO) Take 1 tablet by mouth every other day. [place] 3 billion    . rosuvastatin (CRESTOR) 10 MG tablet Take 1 tablet (10 mg total) by mouth daily. 30 tablet 11   No current facility-administered medications on file prior to visit.    BP 124/67 mmHg  Pulse 62  Temp(Src) 98 F (36.7 C) (Oral)  Ht 6\' 2"  (1.88 m)  Wt 158 lb 12.8 oz (72.031 kg)  BMI 20.38 kg/m2  SpO2 100%      Objective:   Physical Exam  General Mental Status- Alert. General Appearance- Not in acute distress.   Skin General: Color- Normal Color. Moisture- Normal Moisture.   HEENT Head- Normal. Ear Auditory Canal - Left- Normal. Right - Normal.Tympanic Membrane- Left- Normal. Right- Normal. Eye Sclera/Conjunctiva- Left- Normal. Right- Normal. Nose & Sinuses Nasal Mucosa- Left-  Boggy and Congested. Right-  Boggy and  Congested.no maxillary but  rt frontal sinus pressure.(edfe of temporal area mild tender. (no vein seen in this area. No rash or vesicles) Mouth & Throat Lips: Upper Lip- Normal: no dryness,  cracking, pallor, cyanosis, or vesicular eruption. Lower Lip-Normal: no dryness, cracking, pallor, cyanosis or vesicular eruption. Buccal Mucosa- Bilateral- No Aphthous ulcers. Oropharynx- No Discharge or Erythema. +pnd Tonsils: Characteristics- Bilateral- No Erythema or Congestion. Size/Enlargement- Bilateral- No enlargement. Discharge- bilateral-None.  Neck Neck- Supple. No Masses. Rt trapezius upper portion where connects to cranium is tender. No carotid bruits.   Chest and Lung Exam Auscultation: Breath Sounds:-Clear even and unlabored.  Cardiovascular Auscultation:Rythm- Regular, rate and rhythm. Murmurs & Other Heart Sounds:Ausculatation of the heart reveal- No Murmurs.  Lymphatic Head & Neck General Head & Neck Lymphatics: Bilateral: Description- No Localized lymphadenopathy.      Neurologic Cranial Nerve exam:- CN III-XII intact(No nystagmus), symmetric smile. Drift Test:- No drift. Romberg Exam:- Negative.  Heal to Toe Gait exam:-Normal. Finger to Nose:- Normal/Intact Strength:- 5/5 equal and symmetric strength both upper and lower extremities.      Assessment & Plan:

## 2015-02-23 NOTE — Assessment & Plan Note (Signed)
Possible low level tension ha with sinus infection. Normal neuro exam. Edge of temporal area mild tender. Will get sed rate.   Use low dose tylenol or ibuprofen pending sed rate results.  If at any point ha worsens or changes as discussed then ED eval.

## 2015-02-23 NOTE — Assessment & Plan Note (Addendum)
Your appear to have a sinus infection. I am prescribing doxycycline antibiotic for the infection. To help with the nasal congestion I prescribed nasonex  nasal steroid. For your associated cough, I prescribed cough medicine benzonatate.  Rest, hydrate, tylenol for fever.  Follow up in 7 days or as needed.

## 2015-02-23 NOTE — Patient Instructions (Signed)
Sinusitis, acute frontal Your appear to have a sinus infection. I am prescribing doxycycline antibiotic for the infection. To help with the nasal congestion I prescribed nasonex  nasal steroid. For your associated cough, I prescribed cough medicine benzonatate.  Rest, hydrate, tylenol for fever.  Follow up in 7 days or as needed.   Headache Possible low level tension ha with sinus infection. Normal neuro exam. Edge of temporal area mild tender. Will get sed rate.   Use low dose tylenol or ibuprofen pending sed rate results.  If at any point ha worsens or changes as discussed then ED eval.

## 2015-02-23 NOTE — Progress Notes (Signed)
Pre visit review using our clinic review tool, if applicable. No additional management support is needed unless otherwise documented below in the visit note. 

## 2015-02-23 NOTE — Telephone Encounter (Signed)
Pt sed rate mild elevated. He had some rt frontal sinus type pain. Maybe faint temporal area pain. I did do sed rate and it was one point elevated. Possible recent allergy signs and symptoms as well. I am going to call him in prednisone low dose 10 mg #5 1 tab po q day. I advised him on finding. If he gets any temporal region pain. Will need to repeat sed rate. He will hold ibuprofen over next 5 days. Folllow up with me in 7 days.

## 2015-02-24 ENCOUNTER — Other Ambulatory Visit: Payer: Self-pay

## 2015-02-24 MED ORDER — ROSUVASTATIN CALCIUM 10 MG PO TABS: 10.0000 mg | ORAL_TABLET | Freq: Every day | ORAL | Status: DC

## 2015-03-03 ENCOUNTER — Encounter: Payer: Self-pay | Admitting: Medical

## 2015-03-03 ENCOUNTER — Ambulatory Visit (INDEPENDENT_AMBULATORY_CARE_PROVIDER_SITE_OTHER): Payer: Medicare Other | Admitting: Medical

## 2015-03-03 ENCOUNTER — Other Ambulatory Visit: Payer: Self-pay

## 2015-03-03 VITALS — BP 119/77 | HR 62 | Temp 97.6°F | Ht 74.0 in | Wt 162.6 lb

## 2015-03-03 DIAGNOSIS — J0110 Acute frontal sinusitis, unspecified: Secondary | ICD-10-CM | POA: Diagnosis not present

## 2015-03-03 DIAGNOSIS — G44209 Tension-type headache, unspecified, not intractable: Secondary | ICD-10-CM

## 2015-03-03 LAB — SEDIMENTATION RATE: Sed Rate: 15 mm/hr (ref 0–22)

## 2015-03-03 NOTE — Assessment & Plan Note (Addendum)
Pt has used doxycyline. Considered getting CT of sinus but first see if can get expidited neuro appointment.  So as not to dupilcate imaging.

## 2015-03-03 NOTE — Patient Instructions (Addendum)
Headache Ha intermittent past 3 wks. Some mild sed rate elevation. And some brief resolution of ha possibly when on low dose steroid for brief period. Ha now reoccuring. Sometimes wakes him from sleep. Some bitemporal at times.  Repeat sed rate today.   Sinusitis, acute frontal Pt has used doxycyline. Considered getting CT of sinus but first see if can get expidited neuro appointment.  So as not to dupilcate imaging.    Follow up on Monday this upcoming week if not able to get in to neurologist. In that event would try to go ahead and get ct of the sinus.   If at any point severe ha with neurologic signs or symptms then ED evaluation.

## 2015-03-03 NOTE — Progress Notes (Signed)
Pre visit review using our clinic review tool, if applicable. No additional management support is needed unless otherwise documented below in the visit note. 

## 2015-03-03 NOTE — Assessment & Plan Note (Addendum)
Ha intermittent past 3 wks. Some mild sed rate elevation. And some brief resolution of ha possibly when on low dose steroid for brief period. Ha now reoccuring. Sometimes wakes him from sleep. Some bitemporal at times.  Repeat sed rate today.  Try to get him appointment with neurologist by early next week at latest. Maybe this week.

## 2015-03-03 NOTE — Progress Notes (Signed)
Subjective:    Patient ID: [name], male    DOB: March 01, 1930, 81 y.o.   MRN: [id number]  HPI   Pt in for follow up. Pt had some  days of very minimal ha and 2 days no symptoms. Pt is still able to blow out some very faint mucous but that is better/much less than before. Yesterday and today the frontal pressure has returned. Some mild rt side temporal pain on the last exam. Now some pain temporal area on both sides at times. Sometimes will wake with ha. Low level ha 2/10. Now total time for ha 3 weeks.  HA will subside by end of the day but start im am  He notes sometimes with ha will feel like pulsation from neck area.(Note on exam no carotid bruits)  Pt sed rate on last exam was mild elevated. I rx'd brief low dose prednisone.  Also for potential sinus infection based on last visit. I did give rx Doxycycline.  During interview he states low level ha went away. This pain is waxing and waning. His pain did not return later during the interview.     Review of Systems  Constitutional: Negative for fever, chills, diaphoresis, activity change and fatigue.       Faint frontal sinus pressure.  HENT: Positive for sinus pressure. Negative for congestion, ear pain, mouth sores, nosebleeds, postnasal drip, sneezing and sore throat.   Respiratory: Negative for cough, chest tightness and shortness of breath.   Cardiovascular: Negative for chest pain, palpitations and leg swelling.  Gastrointestinal: Negative for nausea, vomiting and abdominal pain.  Musculoskeletal: Negative for back pain, neck pain and neck stiffness.  Neurological: Positive for headaches. Negative for dizziness, tremors, seizures, syncope, facial asymmetry, speech difficulty, weakness, light-headedness and numbness.       Low level ha now 2/10.  Hematological: Negative for adenopathy. Does not bruise/bleed easily.  Psychiatric/Behavioral: Negative for behavioral problems, confusion and agitation. The patient is not  nervous/anxious.      Past Medical History  Diagnosis Date  . Mixed hyperlipidemia   . UNSPECIFIED ANEMIA   . CAD     a. 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  [name] Atrial fibrillation   . PREMATURE VENTRICULAR CONTRACTIONS     a. After CABG - was on Coumadin/Multaq for a period of time. Coumadin discontinued 04/2010 after maintaining NSR.  [name] Esophageal reflux   . IBS   . HYPERPLASIA, PROSTATE NOS W/URINARY OBST/LUTS   . LUMBAR RADICULOPATHY, RIGHT   . OSTEOPOROSIS   . INGUINAL HERNIA   . Lichen planus [id number]    [name]  . Diverticulosis 2011    Diverticulitis 2004  . PONV (postoperative nausea and vomiting)     History   Social History  . Marital Status: Married    Spouse Name: N/A  . Number of Children: 0  . Years of Education: N/A   Occupational History  . retired    Social History Main Topics  . Smoking status: Former Smoker    Types: Cigarettes    Quit date: 11/27/1962  . Smokeless tobacco: Never Used  . Alcohol Use: Yes     Comment: 2 oz wine/ night  . Drug Use: No  . Sexual Activity: Not Currently   Other Topics Concern  . Not on file   Social History Narrative    Past Surgical History  Procedure Laterality Date  .  Knee surgery Right 1977    repair  . Hip fracture surgery Right 03/2007    Dr. Salvadore Farber  . Inguinal hernia repair Left 2012    Dr Brantley Stage  . Coronary artery bypass graft      2 VD;anomalous RCA;post op compliacted by AF  . Colonoscopy  2011    Diverticulosis; Dr Deatra Ina  . Prostate biopsy  09/19/2013    [facility]  . Cataract extraction Right 10/06/2013    Family History  Problem Relation Age of Onset  . Breast cancer Sister   . Stroke Mother   . Heart disease Father   . Heart disease Sister 73    Allergies  Allergen Reactions  . Amoxicillin Other (See Comments)    REACTION: n/v/d    Current Outpatient Prescriptions on File Prior to  Visit  Medication Sig Dispense Refill  . aspirin EC 81 MG tablet Take 1 tablet (81 mg total) by mouth daily.    . Calcium Carbonate Antacid (TUMS PO) Take 1 tablet by mouth 2 (two) times daily as needed. For calcium     . cholecalciferol (VITAMIN D) 1000 UNITS tablet Take 1,000 Units by mouth every morning.      [name] ELIQUIS 5 MG TABS tablet TAKE ONE (1) TABLET BY MOUTH TWO (2) TIMES DAILY 60 tablet 10  . folic acid (FOLVITE) 1 MG tablet Take 1 tablet (1 mg total) by mouth daily. 30 tablet 11  . MULTAQ 400 MG tablet TAKE ONE (1) TABLET BY MOUTH TWO (2) TIMES DAILY WITH A MEAL 60 tablet 10  . Multiple Vitamin (MULITIVITAMIN WITH MINERALS) TABS Take 1 tablet by mouth daily.      . nitroGLYCERIN (NITROSTAT) 0.4 MG SL tablet Place 1 tablet (0.4 mg total) under the tongue every 5 (five) minutes as needed for chest pain. 30 tablet 0  . pantoprazole (PROTONIX) 40 MG tablet Take 1 tablet (40 mg total) by mouth 2 (two) times daily. (Patient taking differently: Take 40 mg by mouth as needed. ) 60 tablet 5  . Probiotic Product (PROBIOTIC PO) Take 1 tablet by mouth every other day. [place] 3 billion    . rosuvastatin (CRESTOR) 10 MG tablet Take 1 tablet (10 mg total) by mouth daily. 30 tablet 5   No current facility-administered medications on file prior to visit.    BP 119/77 mmHg  Pulse 62  Temp(Src) 97.6 F (36.4 C) (Oral)  Ht 6\' 2"  (1.88 m)  Wt 162 lb 9.6 oz (73.755 kg)  BMI 20.87 kg/m2  SpO2 98%      Objective:   Physical Exam       General  Mental Status - Alert. General Appearance - Well groomed. Not in acute distress.  Skin General: Color- Normal Color. Moisture- Normal Moisture.  HEENT Head- Normal. Ear Auditory Canal - Left- Normal. Right - Normal.Tympanic Membrane- Left- Normal. Right- Normal. Eye Sclera/Conjunctiva- Left- Normal. Right- Normal. Nose & Sinuses Nasal Mucosa- Left-  Boggy and Congested. Right-  Boggy and  Congested. No  maxillary but faint frontal  sinus pressure. Mouth & Throat Lips: Upper Lip- Normal: no dryness, cracking, pallor, cyanosis, or vesicular eruption. Lower Lip-Normal: no dryness, cracking, pallor, cyanosis or vesicular eruption. Buccal Mucosa- Bilateral- No Aphthous ulcers. Oropharynx- No Discharge or Erythema. Tonsils: Characteristics- Bilateral- No Erythema or Congestion. Size/Enlargement- Bilateral- No enlargement. Discharge- bilateral-None.     Neck Carotid Arteries- Normal color. Moisture- Normal Moisture. No carotid bruits. No JVD.  Chest and Lung Exam Auscultation: Breath Sounds:-Normal.  Cardiovascular Auscultation:Rythm- Regular. Murmurs & Other Heart Sounds:Auscultation of the heart reveals- No Murmurs.  Abdomen Inspection:-Inspeection Normal. Palpation/Percussion:Note:No mass. Palpation and Percussion of the abdomen reveal- Non Tender, Non Distended + BS, no rebound or guarding.    Neurologic Cranial Nerve exam:- CN III-XII intact(No nystagmus), symmetric smile. Drift Test:- No drift. Finger to Nose:- Normal/Intact Strength:- 5/5 equal and symmetric strength both upper and lower extremities.      Assessment & Plan:

## 2015-03-05 ENCOUNTER — Encounter: Payer: Self-pay | Admitting: Neurology

## 2015-03-05 ENCOUNTER — Ambulatory Visit (INDEPENDENT_AMBULATORY_CARE_PROVIDER_SITE_OTHER): Payer: Medicare Other | Admitting: Neurology

## 2015-03-05 VITALS — BP 110/60 | HR 71 | Ht 74.0 in | Wt 162.1 lb

## 2015-03-05 DIAGNOSIS — R51 Headache: Secondary | ICD-10-CM

## 2015-03-05 DIAGNOSIS — R519 Headache, unspecified: Secondary | ICD-10-CM

## 2015-03-05 DIAGNOSIS — G4452 New daily persistent headache (NDPH): Secondary | ICD-10-CM | POA: Diagnosis not present

## 2015-03-05 DIAGNOSIS — H02402 Unspecified ptosis of left eyelid: Secondary | ICD-10-CM | POA: Diagnosis not present

## 2015-03-05 NOTE — Progress Notes (Signed)
[facility] Clinic Note - Initial Visit   Date: 03/05/2015   [name] MRN: [id number] DOB: 09-08-1930   Dear Dr. Larose Kells:  Thank you for your kind referral of [name] for consultation of headaches. Although his history is well known to you, please allow [place] to reiterate it for the purpose of our medical record. The patient was accompanied to the clinic by self.   History of Present Illness: [name] is a 81 y.o. right-handed Caucasian male with hyperlipidemia, CAD s/p CABG, GERD, and atrial fibrillation on anticoagulation presenting for evaluation of headaches.    Starting in early March 2016, he developed low-grade throbbing bifrontal and bitemporal headaches.  He wakes up in the morning with pain and ranks it as 5-6.  Headaches are always worse in the morning (5am - 7am) and after blowing his nose and getting the mucus out, he feel much better and headaches are improved. He feels that his eyes are tired, but denies any blurry vision or double vision.   He has taken tylenol for the pain, but it doesn't seem to help. Headache is resolved by lunch time, but he still has a runny nose.  He was given an antibiotic course and notices that's the only time when his headaches were better.  Since having these headaches, he is no longer exercising.  Currently headache is 1-2/10.  Denies any numbness tingling. No personality changes or bowel/bladder incontinence.   He was seen by his PCP who ordered ESR which was 23 and recheck was 15.  He has no had any previous imaging.  Out-side paper records, electronic medical record, and images have been reviewed where available and summarized as:  Component     Latest Ref Rng 02/23/2015 03/03/2015  Sed Rate     0 - 22 mm/hr 23 (H) 15   Lab Results  Component Value Date   TSH 2.00 08/06/2014    Past Medical History  Diagnosis Date  . Mixed hyperlipidemia   . UNSPECIFIED ANEMIA   . CAD     a. 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  [name] Atrial fibrillation   . PREMATURE VENTRICULAR CONTRACTIONS     a. After CABG - was on Coumadin/Multaq for a period of time. Coumadin discontinued 04/2010 after maintaining NSR.  [name] Esophageal reflux   . IBS   . HYPERPLASIA, PROSTATE NOS W/URINARY OBST/LUTS   . LUMBAR RADICULOPATHY, RIGHT   . OSTEOPOROSIS   . INGUINAL HERNIA   . Lichen planus [id number]    [name]  . Diverticulosis 2011    Diverticulitis 2004  . PONV (postoperative nausea and vomiting)     Past Surgical History  Procedure Laterality Date  . Knee surgery Right 1977    repair  . Hip fracture surgery Right 03/2007    Dr. Salvadore Farber  . Inguinal hernia repair Left 2012    Dr Brantley Stage  . Coronary artery bypass graft      2 VD;anomalous RCA;post op compliacted by AF  . Colonoscopy  2011    Diverticulosis; Dr Deatra Ina  . Prostate biopsy  09/19/2013    [facility]  . Cataract extraction Right 10/06/2013     Medications:  Current Outpatient Prescriptions on File Prior to Visit  Medication Sig Dispense Refill  . aspirin EC 81 MG tablet Take 1 tablet (81 mg total) by mouth daily.    [name]  Calcium Carbonate Antacid (TUMS PO) Take 1 tablet by mouth 2 (two) times daily as needed. For calcium     . cholecalciferol (VITAMIN D) 1000 UNITS tablet Take 1,000 Units by mouth every morning.      [name] ELIQUIS 5 MG TABS tablet TAKE ONE (1) TABLET BY MOUTH TWO (2) TIMES DAILY 60 tablet 10  . folic acid (FOLVITE) 1 MG tablet Take 1 tablet (1 mg total) by mouth daily. 30 tablet 11  . MULTAQ 400 MG tablet TAKE ONE (1) TABLET BY MOUTH TWO (2) TIMES DAILY WITH A MEAL 60 tablet 10  . Multiple Vitamin (MULITIVITAMIN WITH MINERALS) TABS Take 1 tablet by mouth daily.      . nitroGLYCERIN (NITROSTAT) 0.4 MG SL tablet Place 1 tablet (0.4 mg total) under the tongue every 5 (five) minutes as needed for chest pain. 30 tablet 0  . pantoprazole (PROTONIX)  40 MG tablet Take 1 tablet (40 mg total) by mouth 2 (two) times daily. (Patient taking differently: Take 40 mg by mouth as needed. ) 60 tablet 5  . Probiotic Product (PROBIOTIC PO) Take 1 tablet by mouth every other day. [place] 3 billion    . rosuvastatin (CRESTOR) 10 MG tablet Take 1 tablet (10 mg total) by mouth daily. 30 tablet 5   No current facility-administered medications on file prior to visit.    Allergies:  Allergies  Allergen Reactions  . Amoxicillin Other (See Comments)    REACTION: n/v/d    Family History: Family History  Problem Relation Age of Onset  . Breast cancer Sister     Deceased  . Stroke Mother   . Pancreatic cancer Father     Deceased, 63  . Heart disease Sister 64    Social History: History   Social History  . Marital Status: Married    Spouse Name: N/A  . Number of Children: 0  . Years of Education: N/A   Occupational History  . retired    Social History Main Topics  . Smoking status: Former Smoker    Types: Cigarettes    Quit date: 11/27/1962  . Smokeless tobacco: Never Used  . Alcohol Use: 0.0 oz/week    0 Standard drinks or equivalent per week     Comment: 2 oz wine/ night  . Drug Use: No  . Sexual Activity: Not Currently   Other Topics Concern  . Not on file   Social History Narrative   Lives at [facility] in independent living.  His wife lives in the skilled nursing area.  Has no children.  Retired from [employer].  Still drives.    Review of Systems:  CONSTITUTIONAL: No fevers, chills, night sweats, or weight loss.   EYES: No visual changes or eye pain ENT: No hearing changes.  No history of nose bleeds.   RESPIRATORY: No cough, wheezing and shortness of breath.   CARDIOVASCULAR: Negative for chest pain, and palpitations.   GI: Negative for abdominal discomfort, blood in stools or black stools.  No recent change in bowel habits.   GU:  No history of incontinence.   MUSCLOSKELETAL: No history of joint pain or  swelling.  No myalgias.   SKIN: Negative for lesions, rash, and itching.   HEMATOLOGY/ONCOLOGY: Negative for prolonged bleeding, bruising easily, and swollen nodes.  No history of cancer.   ENDOCRINE: Negative for cold or heat intolerance, polydipsia or goiter.   PSYCH:  No depression or anxiety symptoms.   NEURO: As Above.   Vital Signs:  BP 110/60 mmHg  Pulse 71  Ht 6' 2"  (1.88 m)  Wt 162 lb 1 oz (73.511 kg)  BMI 20.80 kg/m2  SpO2 94%   General Medical Exam:   General:  Well appearing, comfortable.   Eyes/ENT: see cranial nerve examination.  Temporal arteries are normal in texture. No bruits.  Neck: No masses appreciated.  Full range of motion without tenderness.  No carotid bruits. Respiratory:  Clear to auscultation, good air entry bilaterally.   Cardiac:  Regular rate and rhythm, no murmur.   Extremities:  No deformities, edema, or skin discoloration.  Skin:  No rashes or lesions.  Neurological Exam: MENTAL STATUS including orientation to time, place, person, recent and remote memory, attention span and concentration, language, and fund of knowledge is normal.  Speech is not dysarthric.  CRANIAL NERVES: II:  No visual field defects.  Limited fundoscopic exam.    III-IV-VI: Pupils equal round and reactive to light.  Normal conjugate, extra-ocular eye movements in all directions of gaze.  No nystagmus. Subtle left ptosis.  No Horner's. V:  Normal facial sensation.   VII:  Normal facial symmetry and movements.  No pathologic facial reflexes.  VIII:  Normal hearing and vestibular function.   IX-X:  Normal palatal movement.   XI:  Normal shoulder shrug and head rotation.   XII:  Normal tongue strength and range of motion, no deviation or fasciculation.  MOTOR:  No atrophy, fasciculations or abnormal movements.  No pronator drift.  Tone is normal.    Right Upper Extremity:    Left Upper Extremity:    Deltoid  5/5   Deltoid  5/5   Biceps  5/5   Biceps  5/5   Triceps  5/5    Triceps  5/5   Wrist extensors  5/5   Wrist extensors  5/5   Wrist flexors  5/5   Wrist flexors  5/5   Finger extensors  5/5   Finger extensors  5/5   Finger flexors  5/5   Finger flexors  5/5   Dorsal interossei  5/5   Dorsal interossei  5/5   Abductor pollicis  5/5   Abductor pollicis  5/5   Tone (Ashworth scale)  0  Tone (Ashworth scale)  0   Right Lower Extremity:    Left Lower Extremity:    Hip flexors  5/5   Hip flexors  5/5   Hip extensors  5/5   Hip extensors  5/5   Knee flexors  5/5   Knee flexors  5/5   Knee extensors  5/5   Knee extensors  5/5   Dorsiflexors  5/5   Dorsiflexors  5/5   Plantarflexors  5/5   Plantarflexors  5/5   Toe extensors  5/5   Toe extensors  5/5   Toe flexors  5/5   Toe flexors  5/5   Tone (Ashworth scale)  0  Tone (Ashworth scale)  0   MSRs:  Right                                                                 Left brachioradialis 2+  brachioradialis 2+  biceps 2+  biceps 2+  triceps 2+  triceps 2+  patellar 2+  patellar 2+  ankle jerk  0  ankle jerk 0  Hoffman no  Hoffman no  plantar response down  plantar response down   SENSORY:  Absent vibration at the ankles bilaterally, otherwise normal and symmetric perception of light touch, pinprick, vibration, and proprioception.  Romberg's sign absent.   COORDINATION/GAIT: Normal finger-to- nose-finger and heel-to-shin.  Intact rapid alternating movements bilaterally.  Able to rise from a chair without using arms.  Gait narrow based and stable. Tandem and stressed gait intact.    IMPRESSION: Mr. Rastetter is a 81 year-old gentleman presenting for evaluation of new onset of bifrontal and bitemporal headaches.  Exam notable for subtle left ptosis (old) and otherwise is age-appropriate.  Because headaches are atypical to develop in his age and especially with morning headaches, imaging of the head and vessels will be obtained.  With normal ESR and lack of temporal tenderness as well as vision complaints, low  suspicion for giant cell arteritis.    PLAN/RECOMMENDATIONS:  1. MRI brain and MRA neck and carotids 2. Muscle relaxant declined by patient due to potential side effect of drowsiness 3. Return to clinic in 1 month.   The duration of this appointment visit was 45 minutes of face-to-face time with the patient.  Greater than 50% of this time was spent in counseling, explanation of diagnosis, planning of further management, and coordination of care.   Thank you for allowing me to participate in patient's care.  If I can answer any additional questions, I would be pleased to do so.    Sincerely,    Donika K. Posey Pronto, DO

## 2015-03-05 NOTE — Patient Instructions (Addendum)
1.  MRI brain, MRA carotids, MRA head 2.  Return to clinic after imaging

## 2015-03-07 ENCOUNTER — Encounter (HOSPITAL_BASED_OUTPATIENT_CLINIC_OR_DEPARTMENT_OTHER): Payer: Self-pay | Admitting: *Deleted

## 2015-03-07 ENCOUNTER — Emergency Department (HOSPITAL_BASED_OUTPATIENT_CLINIC_OR_DEPARTMENT_OTHER)
Admission: EM | Admit: 2015-03-07 | Discharge: 2015-03-08 | Disposition: A | Discharge status: Home / Self Care | Payer: Medicare Other | Attending: Emergency Medicine | Admitting: Emergency Medicine

## 2015-03-07 DIAGNOSIS — R112 Nausea with vomiting, unspecified: Secondary | ICD-10-CM | POA: Insufficient documentation

## 2015-03-07 DIAGNOSIS — E785 Hyperlipidemia, unspecified: Secondary | ICD-10-CM | POA: Diagnosis not present

## 2015-03-07 DIAGNOSIS — D649 Anemia, unspecified: Secondary | ICD-10-CM | POA: Insufficient documentation

## 2015-03-07 DIAGNOSIS — J8410 Pulmonary fibrosis, unspecified: Secondary | ICD-10-CM | POA: Diagnosis not present

## 2015-03-07 DIAGNOSIS — Z87448 Personal history of other diseases of urinary system: Secondary | ICD-10-CM | POA: Diagnosis not present

## 2015-03-07 DIAGNOSIS — R1110 Vomiting, unspecified: Secondary | ICD-10-CM

## 2015-03-07 DIAGNOSIS — Z8719 Personal history of other diseases of the digestive system: Secondary | ICD-10-CM | POA: Insufficient documentation

## 2015-03-07 DIAGNOSIS — Z951 Presence of aortocoronary bypass graft: Secondary | ICD-10-CM | POA: Insufficient documentation

## 2015-03-07 DIAGNOSIS — R51 Headache: Secondary | ICD-10-CM | POA: Insufficient documentation

## 2015-03-07 DIAGNOSIS — I2510 Atherosclerotic heart disease of native coronary artery without angina pectoris: Secondary | ICD-10-CM | POA: Diagnosis not present

## 2015-03-07 DIAGNOSIS — M810 Age-related osteoporosis without current pathological fracture: Secondary | ICD-10-CM | POA: Diagnosis not present

## 2015-03-07 DIAGNOSIS — Z79899 Other long term (current) drug therapy: Secondary | ICD-10-CM | POA: Insufficient documentation

## 2015-03-07 DIAGNOSIS — Z880 Allergy status to penicillin: Secondary | ICD-10-CM | POA: Diagnosis not present

## 2015-03-07 DIAGNOSIS — Z87891 Personal history of nicotine dependence: Secondary | ICD-10-CM | POA: Diagnosis not present

## 2015-03-07 DIAGNOSIS — R519 Headache, unspecified: Secondary | ICD-10-CM

## 2015-03-07 DIAGNOSIS — Z7982 Long term (current) use of aspirin: Secondary | ICD-10-CM | POA: Insufficient documentation

## 2015-03-07 MED ORDER — SODIUM CHLORIDE 0.9 % IV BOLUS (SEPSIS)
1000.0000 mL | Freq: Once | INTRAVENOUS | Status: AC
  Administered 2015-03-07: 1000 mL via INTRAVENOUS

## 2015-03-07 NOTE — ED Provider Notes (Signed)
CSN: [id number]     Arrival date & time 03/07/15  2149 History  This chart was scribed for Tabatha Razzano, MD by Edison Simon, ED Scribe. This patient was seen in room MH12/MH12 and the patient's care was started at 11:55 PM.    Chief Complaint  Patient presents with  . Emesis   Patient is a 81 y.o. male presenting with vomiting. The history is provided by the patient. No language interpreter was used.  Emesis Severity:  Mild Duration:  6 hours Timing:  Intermittent Number of daily episodes:  4 Quality:  Stomach contents Able to tolerate:  Liquids and solids Progression:  Resolved Chronicity:  New Recent urination:  Normal Context: not post-tussive and not self-induced   Relieved by:  Nothing Worsened by:  Nothing tried Ineffective treatments:  None tried Associated symptoms: headaches   Associated symptoms: no abdominal pain, no chills, no cough, no diarrhea and no fever   Headaches:    Severity:  Mild   Onset quality:  Gradual   Duration:  1 month   Timing:  Constant   Progression:  Waxing and waning   Chronicity:  New Risk factors: no travel to endemic areas     HPI Comments: FERDINANDO LODGE is a 81 y.o. male who presents to the Emergency Department complaining of nausea and vomiting with onset at 1500; he reports 4 counts of vomiting, last of which was at 2100. He also reports headache upon waking at 0500 this morning. He states he has had headaches for the past month upon waking but not every morning. He states it sometimes resolves over the course of the day but has not resolved today. He was evaluated for this problem and treated for sinus infection without improvement, then saw a neurologist and is being set up for MRI. He states the headache makes his eyes feel out of focus.   Past Medical History  Diagnosis Date  . Mixed hyperlipidemia   . UNSPECIFIED ANEMIA   . CAD     a. 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  [name] Atrial fibrillation   . PREMATURE VENTRICULAR CONTRACTIONS     a. After CABG - was on Coumadin/Multaq for a period of time. Coumadin discontinued 04/2010 after maintaining NSR.  [name] Esophageal reflux   . IBS   . HYPERPLASIA, PROSTATE NOS W/URINARY OBST/LUTS   . LUMBAR RADICULOPATHY, RIGHT   . OSTEOPOROSIS   . INGUINAL HERNIA   . Lichen planus [id number]    [name]  . Diverticulosis 2011    Diverticulitis 2004  . PONV (postoperative nausea and vomiting)    Past Surgical History  Procedure Laterality Date  . Knee surgery Right 1977    repair  . Hip fracture surgery Right 03/2007    Dr. Salvadore Farber  . Inguinal hernia repair Left 2012    Dr Brantley Stage  . Coronary artery bypass graft      2 VD;anomalous RCA;post op compliacted by AF  . Colonoscopy  2011    Diverticulosis; Dr Deatra Ina  . Prostate biopsy  09/19/2013    [facility]  . Cataract extraction Right 10/06/2013   Family History  Problem Relation Age of Onset  . Breast cancer Sister     Deceased  . Stroke Mother   . Pancreatic cancer Father     Deceased, 32  . Heart disease Sister 47   History  Substance Use Topics  .  Smoking status: Former Smoker    Types: Cigarettes    Quit date: 11/27/1962  . Smokeless tobacco: Never Used  . Alcohol Use: 0.0 oz/week    0 Standard drinks or equivalent per week     Comment: 2 oz wine/ night    Review of Systems  Constitutional: Negative for fever and chills.  Respiratory: Negative for shortness of breath.   Cardiovascular: Negative for chest pain.  Gastrointestinal: Positive for nausea and vomiting. Negative for abdominal pain, diarrhea and constipation.  Musculoskeletal: Negative for neck pain and neck stiffness.  Neurological: Positive for headaches. Negative for dizziness, syncope, facial asymmetry, speech difficulty, weakness and numbness.  All other systems reviewed and are negative.     Allergies  Amoxicillin  Home Medications   Prior to  Admission medications   Medication Sig Start Date End Date Taking? Authorizing Provider  aspirin EC 81 MG tablet Take 1 tablet (81 mg total) by mouth daily. 01/29/14   Josue Hector, MD  Calcium Carbonate Antacid (TUMS PO) Take 1 tablet by mouth 2 (two) times daily as needed. For calcium     Historical Provider, MD  cholecalciferol (VITAMIN D) 1000 UNITS tablet Take 1,000 Units by mouth every morning.      Historical Provider, MD  ELIQUIS 5 MG TABS tablet TAKE ONE (1) TABLET BY MOUTH TWO (2) TIMES DAILY 01/25/15   Josue Hector, MD  folic acid (FOLVITE) 1 MG tablet Take 1 tablet (1 mg total) by mouth daily. 10/26/14   Josue Hector, MD  MULTAQ 400 MG tablet TAKE ONE (1) TABLET BY MOUTH TWO (2) TIMES DAILY WITH A MEAL 01/25/15   Josue Hector, MD  Multiple Vitamin (MULITIVITAMIN WITH MINERALS) TABS Take 1 tablet by mouth daily.      Historical Provider, MD  nitroGLYCERIN (NITROSTAT) 0.4 MG SL tablet Place 1 tablet (0.4 mg total) under the tongue every 5 (five) minutes as needed for chest pain. 12/27/13 05/24/15  Shanker Kristeen Mans, MD  pantoprazole (PROTONIX) 40 MG tablet Take 1 tablet (40 mg total) by mouth 2 (two) times daily. Patient taking differently: Take 40 mg by mouth as needed.  04/14/14   Hendricks Limes, MD  Probiotic Product (PROBIOTIC PO) Take 1 tablet by mouth every other day. [place] 3 billion    Historical Provider, MD  rosuvastatin (CRESTOR) 10 MG tablet Take 1 tablet (10 mg total) by mouth daily. 02/24/15   Meriam Sprague Saguier, PA-C   BP 139/78 mmHg  Pulse 75  Temp(Src) 97.9 F (36.6 C) (Oral)  Resp 20  Ht 6\' 2"  (1.88 m)  Wt 162 lb (73.483 kg)  BMI 20.79 kg/m2  SpO2 97% Physical Exam  Constitutional: He is oriented to person, place, and time. He appears well-developed and well-nourished.  HENT:  Head: Normocephalic and atraumatic.  Mouth/Throat: Oropharynx is clear and moist. No oropharyngeal exudate.  Eyes: Conjunctivae and EOM are normal. Pupils are equal, round, and  reactive to light.  Intact cognition  Neck: Normal range of motion. Neck supple. No tracheal deviation present.  Cardiovascular: Normal rate, regular rhythm and intact distal pulses.   Pulmonary/Chest: Effort normal and breath sounds normal. No respiratory distress. He has no wheezes. He has no rales.  Abdominal: Soft. Bowel sounds are normal. He exhibits no mass. There is no tenderness. There is no rebound and no guarding.  Musculoskeletal: Normal range of motion. He exhibits no edema or tenderness.  Neurological: He is alert and oriented to person, place, and  time. He has normal reflexes. He displays normal reflexes. No cranial nerve deficit. He exhibits normal muscle tone. Coordination normal.  Skin: Skin is warm and dry.  Psychiatric: He has a normal mood and affect.  Nursing note and vitals reviewed.   ED Course  Procedures (including critical care time)  DIAGNOSTIC STUDIES: Oxygen Saturation is 97% on room air, normal by my interpretation.    COORDINATION OF CARE: 11:59 PM Discussed treatment plan with patient at beside, the patient agrees with the plan and has no further questions at this time.   Labs Review Labs Reviewed - No data to display  Imaging Review No results found.   EKG Interpretation None      MDM   Final diagnoses:  None  After initial evaluation dexamethasone completely abated the headache then patient decided the nausea was a second issue.  Given that it lasted > 8 hours with negative EKG and troponin ACS was excluded.  No abdominal pain.  Patient and EDP had a lengthy discussion as patient now reports he has many of these episodes of nausea in the past and no one can ever find anything.  I suspect this is due to the fact that the patient is not taking his protonix.  It will not work if it is only being taken PRN.  Will restart this medication and prescribe prn zofran until active.  Patient is amenable to this plan and agrees to follow up  ED ECG  REPORT   Date: 03/08/2015  Rate: 74  Rhythm: normal sinus rhythm  QRS Axis: normal  Intervals: normal  ST/T Wave abnormalities: normal  Conduction Disutrbances:none  Narrative Interpretation: pvc  Old EKG Reviewed: none available   I personally performed the services described in this documentation, which was scribed in my presence. The recorded information has been reviewed and is accurate.    Veatrice Kells, MD 03/08/15 1234

## 2015-03-07 NOTE — ED Notes (Signed)
Reports headache this morning- states has been having headache for a month but they usually go away- but today the headache hasn't gotten any better- vomited x 4

## 2015-03-08 ENCOUNTER — Encounter (HOSPITAL_BASED_OUTPATIENT_CLINIC_OR_DEPARTMENT_OTHER): Payer: Self-pay | Admitting: Emergency Medicine

## 2015-03-08 ENCOUNTER — Emergency Department (HOSPITAL_BASED_OUTPATIENT_CLINIC_OR_DEPARTMENT_OTHER): Payer: Medicare Other

## 2015-03-08 DIAGNOSIS — J8410 Pulmonary fibrosis, unspecified: Secondary | ICD-10-CM | POA: Diagnosis not present

## 2015-03-08 DIAGNOSIS — R51 Headache: Secondary | ICD-10-CM | POA: Diagnosis not present

## 2015-03-08 LAB — CBC WITH DIFFERENTIAL/PLATELET
Basophils Absolute: 0 10*3/uL (ref 0.0–0.1)
Basophils Relative: 0 % (ref 0–1)
Eosinophils Absolute: 0 10*3/uL (ref 0.0–0.7)
Eosinophils Relative: 0 % (ref 0–5)
HEMATOCRIT: 41.2 % (ref 39.0–52.0)
Hemoglobin: 13.7 g/dL (ref 13.0–17.0)
LYMPHS PCT: 11 % — AB (ref 12–46)
Lymphs Abs: 1 10*3/uL (ref 0.7–4.0)
MCH: 31.3 pg (ref 26.0–34.0)
MCHC: 33.3 g/dL (ref 30.0–36.0)
MCV: 94.1 fL (ref 78.0–100.0)
Monocytes Absolute: 0.2 10*3/uL (ref 0.1–1.0)
Monocytes Relative: 2 % — ABNORMAL LOW (ref 3–12)
Neutro Abs: 7.8 10*3/uL — ABNORMAL HIGH (ref 1.7–7.7)
Neutrophils Relative %: 87 % — ABNORMAL HIGH (ref 43–77)
Platelets: 143 10*3/uL — ABNORMAL LOW (ref 150–400)
RBC: 4.38 MIL/uL (ref 4.22–5.81)
RDW: 16.1 % — AB (ref 11.5–15.5)
WBC: 9 10*3/uL (ref 4.0–10.5)

## 2015-03-08 LAB — TROPONIN I: Troponin I: <0.03 ng/mL (ref <0.031)

## 2015-03-08 LAB — BASIC METABOLIC PANEL
Anion gap: 9 (ref 5–15)
BUN: 17 mg/dL (ref 6–23)
CO2: 25 mmol/L (ref 19–32)
Calcium: 8.7 mg/dL (ref 8.4–10.5)
Chloride: 103 mmol/L (ref 96–112)
Creatinine, Ser: 0.76 mg/dL (ref 0.50–1.35)
GFR calc Af Amer: >90 mL/min (ref >90)
GFR calc non Af Amer: 83 mL/min — ABNORMAL LOW (ref >90)
GLUCOSE: 142 mg/dL — AB (ref 70–99)
POTASSIUM: 4.2 mmol/L (ref 3.5–5.1)
SODIUM: 137 mmol/L (ref 135–145)

## 2015-03-08 MED ORDER — DEXAMETHASONE SODIUM PHOSPHATE 4 MG/ML IJ SOLN
4.0000 mg | Freq: Once | INTRAMUSCULAR | Status: AC
  Administered 2015-03-08: 4 mg via INTRAVENOUS
  Filled 2015-03-08: qty 1

## 2015-03-08 MED ORDER — ONDANSETRON HCL 4 MG/2ML IJ SOLN
4.0000 mg | Freq: Once | INTRAMUSCULAR | Status: AC
  Administered 2015-03-08: 4 mg via INTRAVENOUS

## 2015-03-08 MED ORDER — ONDANSETRON 8 MG PO TBDP
ORAL_TABLET | ORAL | Status: DC
Start: 2015-03-08 — End: 2015-05-17

## 2015-03-08 MED ORDER — FAMOTIDINE IN NACL 20-0.9 MG/50ML-% IV SOLN
20.0000 mg | Freq: Once | INTRAVENOUS | Status: AC
  Administered 2015-03-08: 20 mg via INTRAVENOUS
  Filled 2015-03-08: qty 50

## 2015-03-08 MED ORDER — PANTOPRAZOLE SODIUM 20 MG PO TBEC: 20.0000 mg | DELAYED_RELEASE_TABLET | Freq: Every day | ORAL | Status: DC

## 2015-03-08 MED ORDER — ONDANSETRON HCL 4 MG/2ML IJ SOLN
INTRAMUSCULAR | Status: AC
  Filled 2015-03-08: qty 2

## 2015-03-08 NOTE — ED Notes (Signed)
Transported to xray 

## 2015-03-08 NOTE — ED Notes (Signed)
MD with pt  

## 2015-03-09 ENCOUNTER — Ambulatory Visit (INDEPENDENT_AMBULATORY_CARE_PROVIDER_SITE_OTHER): Payer: Medicare Other | Admitting: Medical

## 2015-03-09 ENCOUNTER — Encounter: Payer: Self-pay | Admitting: Medical

## 2015-03-09 VITALS — BP 113/65 | HR 62 | Temp 98.1°F | Ht 74.0 in | Wt 163.2 lb

## 2015-03-09 DIAGNOSIS — G4489 Other headache syndrome: Secondary | ICD-10-CM | POA: Diagnosis not present

## 2015-03-09 DIAGNOSIS — R112 Nausea with vomiting, unspecified: Secondary | ICD-10-CM

## 2015-03-09 DIAGNOSIS — J301 Allergic rhinitis due to pollen: Secondary | ICD-10-CM

## 2015-03-09 DIAGNOSIS — J309 Allergic rhinitis, unspecified: Secondary | ICD-10-CM | POA: Insufficient documentation

## 2015-03-09 MED ORDER — MOMETASONE FUROATE 50 MCG/ACT NA SUSP: NASAL | Status: DC

## 2015-03-09 NOTE — Assessment & Plan Note (Signed)
Some component recently and contributing to frontal sinus region ha. Rx nasonex.

## 2015-03-09 NOTE — Progress Notes (Signed)
Pre visit review using our clinic review tool, if applicable. No additional management support is needed unless otherwise documented below in the visit note. 

## 2015-03-09 NOTE — Assessment & Plan Note (Signed)
Pt ha is now resolved for almost 48 hours. I gave antibiotic early on and low dose prednisone when his sed rate was mild elevated. His ha did respond to that treatment. Then came back. When he went to ED his ha repsponded completley to dexamethasone. His Ct scan showed some chronic sinusitis.  Since he appears to have responded to steroid both times. Will provided nasonex nasal spray. If this is not covered well by insurance then flonase otc.  If recurrent frontal sinus pressure with purulent nasal drainage then will rx levofloxin. Presently not clinically indicated.   Encouraged pt to get mri 22nd April. The follow up 26th April with neurologist.  Follow up with [place] mid May.  Presently frontal ha may be from sinus related to recent infection and some allergy component.

## 2015-03-09 NOTE — Assessment & Plan Note (Addendum)
This is now resolved after treatment in ED. If this were occur again then would repeat labs and add lipase.

## 2015-03-09 NOTE — Patient Instructions (Addendum)
Nausea with vomiting This is now resolved after treatment in ED. If this were occur again then would repeat labs and add lipase.   Headache Pt ha is now resolved for almost 48 hours. I gave antibiotic early on and low dose prednisone when his sed rate was mild elevated. His ha did respond to that treatment. Then came back. When he went to ED his ha repsponded completley to dexamethasone. His Ct scan showed some chronic sinusitis.  Since he appears to have responded to steroid both times. Will provided nasonex nasal spray. If this is not covered well by insurance then flonase otc.  If recurrent frontal sinus pressure with purulent nasal drainage then will rx levofloxin. Presently not clinically indicated.   Encouraged pt to get mri 22nd April. The follow up 26th April with neurologist.  Follow up with [place] mid May.  Presently frontal ha may be from sinus related to recent infection and some allergy component.   Allergic rhinitis Some component recently and contributing to frontal sinus region ha. Rx nasonex.

## 2015-03-09 NOTE — Progress Notes (Signed)
Subjective:    Patient ID: [name], male    DOB: 12/27/1929, 81 y.o.   MRN: [id number]  HPI   Pt in states he feels much better than he was feeling. Sunday morning he woke up with ha.Ha did ease some when he was at church. Then later 2 pm after lunch got nausuea and vomited. He took zofran. He was doing ok until 6 pm when he vomited again. He had low level ha all day long.   Later at night 9 pm on Sunday  he vomited again and went to the ED. Pt had CT of head, ekg, cxr. Pt was given some dexamethasone and his headache resolved. HA has not come back Sunday.   Also states no runny nose. Not blowing out any mucous as he was doing on first visit I saw him before I gave him antibiotic.   CT sinus show some chronic sinusitis. (Pt does admit to occasional sneezing. Some rare pnd)  Pt has no longer vomited since ED evaluation.  Pt has seen neurologist. Pt has mri scheduled for the 22nd. Then appointment follow up on 26th.    Review of Systems  Constitutional: Negative for fever, chills, diaphoresis, activity change and fatigue.  HENT: Positive for sneezing. Negative for congestion, ear discharge, ear pain, postnasal drip, rhinorrhea, sore throat, tinnitus and trouble swallowing.   Eyes: Positive for itching.  Respiratory: Negative for cough, chest tightness and shortness of breath.   Cardiovascular: Negative for chest pain, palpitations and leg swelling.  Gastrointestinal: Negative for nausea, vomiting, abdominal pain, constipation, blood in stool and abdominal distention.  Musculoskeletal: Negative for neck pain and neck stiffness.  Neurological: Negative for dizziness, tremors, seizures, syncope, facial asymmetry, speech difficulty, weakness, light-headedness, numbness and headaches.  Psychiatric/Behavioral: Negative for behavioral problems, confusion and agitation. The patient is not nervous/anxious.    Past Medical History  Diagnosis Date  . Mixed hyperlipidemia   . UNSPECIFIED  ANEMIA   . CAD     a. 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  [name] Atrial fibrillation   . PREMATURE VENTRICULAR CONTRACTIONS     a. After CABG - was on Coumadin/Multaq for a period of time. Coumadin discontinued 04/2010 after maintaining NSR.  [name] Esophageal reflux   . IBS   . HYPERPLASIA, PROSTATE NOS W/URINARY OBST/LUTS   . LUMBAR RADICULOPATHY, RIGHT   . OSTEOPOROSIS   . INGUINAL HERNIA   . Lichen planus [id number]    [name]  . Diverticulosis 2011    Diverticulitis 2004  . PONV (postoperative nausea and vomiting)     History   Social History  . Marital Status: Married    Spouse Name: N/A  . Number of Children: 0  . Years of Education: N/A   Occupational History  . retired    Social History Main Topics  . Smoking status: Former Smoker    Types: Cigarettes    Quit date: 11/27/1962  . Smokeless tobacco: Never Used  . Alcohol Use: 0.0 oz/week    0 Standard drinks or equivalent per week     Comment: 2 oz wine/ night  . Drug Use: No  . Sexual Activity: Not Currently   Other Topics Concern  . Not on file   Social History Narrative   Lives at [facility] in independent living.  His wife lives in the skilled nursing area.  Has no children.  Retired from  [employer].  Still drives.    Past Surgical History  Procedure Laterality Date  . Knee surgery Right 1977    repair  . Hip fracture surgery Right 03/2007    Dr. Salvadore Farber  . Inguinal hernia repair Left 2012    Dr Brantley Stage  . Coronary artery bypass graft      2 VD;anomalous RCA;post op compliacted by AF  . Colonoscopy  2011    Diverticulosis; Dr Deatra Ina  . Prostate biopsy  09/19/2013    [facility]  . Cataract extraction Right 10/06/2013    Family History  Problem Relation Age of Onset  . Breast cancer Sister     Deceased  . Stroke Mother   . Pancreatic cancer Father     Deceased, 18  . Heart disease Sister 58    Allergies    Allergen Reactions  . Amoxicillin Other (See Comments)    REACTION: n/v/d    Current Outpatient Prescriptions on File Prior to Visit  Medication Sig Dispense Refill  . Calcium Carbonate Antacid (TUMS PO) Take 1 tablet by mouth 2 (two) times daily as needed. For calcium     . cholecalciferol (VITAMIN D) 1000 UNITS tablet Take 1,000 Units by mouth every morning.      [name] ELIQUIS 5 MG TABS tablet TAKE ONE (1) TABLET BY MOUTH TWO (2) TIMES DAILY 60 tablet 10  . folic acid (FOLVITE) 1 MG tablet Take 1 tablet (1 mg total) by mouth daily. 30 tablet 11  . MULTAQ 400 MG tablet TAKE ONE (1) TABLET BY MOUTH TWO (2) TIMES DAILY WITH A MEAL 60 tablet 10  . Multiple Vitamin (MULITIVITAMIN WITH MINERALS) TABS Take 1 tablet by mouth daily.      . nitroGLYCERIN (NITROSTAT) 0.4 MG SL tablet Place 1 tablet (0.4 mg total) under the tongue every 5 (five) minutes as needed for chest pain. 30 tablet 0  . ondansetron (ZOFRAN ODT) 8 MG disintegrating tablet 8mg  ODT q8 hours prn nausea 6 tablet 0  . pantoprazole (PROTONIX) 20 MG tablet Take 1 tablet (20 mg total) by mouth daily. 30 tablet 0  . Probiotic Product (PROBIOTIC PO) Take 1 tablet by mouth every other day. [place] 3 billion    . rosuvastatin (CRESTOR) 10 MG tablet Take 1 tablet (10 mg total) by mouth daily. 30 tablet 5   No current facility-administered medications on file prior to visit.    BP 113/65 mmHg  Pulse 62  Temp(Src) 98.1 F (36.7 C) (Oral)  Ht 6\' 2"  (1.88 m)  Wt 163 lb 3.2 oz (74.027 kg)  BMI 20.94 kg/m2  SpO2 96%      Objective:   Physical Exam  General  Mental Status - Alert. General Appearance - Well groomed. Not in acute distress.  Skin Rashes- No Rashes.  HEENT Head- Normal. Ear Auditory Canal - Left- Normal. Right - Normal.Tympanic Membrane- Left- Normal. Right- Normal. Eye Sclera/Conjunctiva- Left- Normal. Right- Normal. Nose & Sinuses Nasal Mucosa- Left-  Boggy and Congested. Right-  Boggy and  Congested. No   maxillary and no frontal sinus pressure. Mouth & Throat Lips: Upper Lip- Normal: no dryness, cracking, pallor, cyanosis, or vesicular eruption. Lower Lip-Normal: no dryness, cracking, pallor, cyanosis or vesicular eruption. Buccal Mucosa- Bilateral- No Aphthous ulcers. Oropharynx- No Discharge or Erythema. Mild +PND Tonsils: Characteristics- Bilateral- No Erythema or Congestion. Size/Enlargement- Bilateral- No enlargement. Discharge- bilateral-None.  Neck Neck- Supple. No Masses.   Chest and Lung Exam Auscultation: Breath Sounds:-Clear even and unlabored.  Cardiovascular  Auscultation:Rythm- Regular, rate and rhythm. Murmurs & Other Heart Sounds:Ausculatation of the heart reveal- No Murmurs.  Lymphatic Head & Neck General Head & Neck Lymphatics: Bilateral: Description- No Localized lymphadenopathy.   Neurologic Cranial Nerve exam:- CN III-XII intact(No nystagmus), symmetric smile. Drift Test:- No drift. Finger to Nose:- Normal/Intact Strength:- 5/5 equal and symmetric strength both upper and lower extremities.     Assessment & Plan:

## 2015-03-19 ENCOUNTER — Ambulatory Visit
Admission: RE | Admit: 2015-03-19 | Discharge: 2015-03-19 | Disposition: A | Discharge status: Home / Self Care | Payer: Medicare Other | Source: Ambulatory Visit | Attending: Neurology | Admitting: Neurology

## 2015-03-19 DIAGNOSIS — R51 Headache: Principal | ICD-10-CM

## 2015-03-19 DIAGNOSIS — H02402 Unspecified ptosis of left eyelid: Secondary | ICD-10-CM | POA: Diagnosis not present

## 2015-03-19 DIAGNOSIS — I6521 Occlusion and stenosis of right carotid artery: Secondary | ICD-10-CM | POA: Diagnosis not present

## 2015-03-19 DIAGNOSIS — R519 Headache, unspecified: Secondary | ICD-10-CM

## 2015-03-19 DIAGNOSIS — I679 Cerebrovascular disease, unspecified: Secondary | ICD-10-CM | POA: Diagnosis not present

## 2015-03-19 DIAGNOSIS — G311 Senile degeneration of brain, not elsewhere classified: Secondary | ICD-10-CM | POA: Diagnosis not present

## 2015-03-19 MED ORDER — GADOBENATE DIMEGLUMINE 529 MG/ML IV SOLN
15.0000 mL | Freq: Once | INTRAVENOUS | Status: AC | PRN
Start: 2015-03-19 — End: 2015-03-19
  Administered 2015-03-19: 15 mL via INTRAVENOUS

## 2015-03-23 ENCOUNTER — Ambulatory Visit (INDEPENDENT_AMBULATORY_CARE_PROVIDER_SITE_OTHER): Payer: Medicare Other | Admitting: Neurology

## 2015-03-23 ENCOUNTER — Encounter: Payer: Self-pay | Admitting: Neurology

## 2015-03-23 VITALS — BP 104/60 | HR 64 | Wt 156.0 lb

## 2015-03-23 DIAGNOSIS — R269 Unspecified abnormalities of gait and mobility: Secondary | ICD-10-CM | POA: Diagnosis not present

## 2015-03-23 DIAGNOSIS — G933 Postviral fatigue syndrome: Secondary | ICD-10-CM

## 2015-03-23 DIAGNOSIS — R5383 Other fatigue: Secondary | ICD-10-CM | POA: Diagnosis not present

## 2015-03-23 DIAGNOSIS — G9331 Postviral fatigue syndrome: Secondary | ICD-10-CM

## 2015-03-23 DIAGNOSIS — R519 Headache, unspecified: Secondary | ICD-10-CM

## 2015-03-23 DIAGNOSIS — R51 Headache: Secondary | ICD-10-CM

## 2015-03-23 LAB — VITAMIN B12: VITAMIN B 12: 642 pg/mL (ref 211–911)

## 2015-03-23 MED ORDER — GABAPENTIN 100 MG PO CAPS: ORAL_CAPSULE | ORAL | Status: DC

## 2015-03-23 NOTE — Patient Instructions (Signed)
1.  Start gabapentin 100mg  at bedtime for one week, then increase to 1 tablet morning and bedtime. 2.  Check blood work 3.  Return to clinic in 2 months

## 2015-03-23 NOTE — Progress Notes (Signed)
Follow-up Visit   Date: 03/23/2015    [name] MRN: [id number] DOB: 11-05-1930   Interim History: [name] is a 81 y.o. right-handed Caucasian male with , CAD s/p CABG, GERD, and atrial fibrillation on anticoagulation returning to the clinic for follow-up of headaches.  The patient was accompanied to the clinic by self.  History of present illness: Starting in early March 2016, he developed low-grade throbbing bifrontal and bitemporal headaches. He wakes up in the morning with pain and ranks it as 5-6. Headaches are always worse in the morning (5am - 7am) and after blowing his nose and getting the mucus out, he feel much better and headaches are improved. He feels that his eyes are tired, but denies any blurry vision or double vision. He has taken tylenol for the pain, but it doesn't seem to help. Headache is resolved by lunch time, but he still has a runny nose. He was given an antibiotic course and notices that's the only time when his headaches were better. Since having these headaches, he is no longer exercising. Currently headache is 1-2/10. Denies any numbness tingling. No personality changes or bowel/bladder incontinence.   He was seen by his PCP who ordered ESR which was 23 and recheck was 15. He has no had any previous imaging.  UPDATE 03/23/2015:  He reports having nausea, vomiting, and diarrhea two weeks ago, but has persistent changes in taste, lack of appetite.  Headaches are better than when he was last here.  He continues to have intermittent morning headaches.  He endorses light sensitivity and retroorbital pain.   Medications:  Current Outpatient Prescriptions on File Prior to Visit  Medication Sig Dispense Refill  . Calcium Carbonate Antacid (TUMS PO) Take 1 tablet by mouth 2 (two) times daily as needed. For calcium     . cholecalciferol (VITAMIN D) 1000 UNITS tablet Take 1,000 Units by mouth every morning.      [name] ELIQUIS 5 MG TABS tablet TAKE ONE (1)  TABLET BY MOUTH TWO (2) TIMES DAILY 60 tablet 10  . folic acid (FOLVITE) 1 MG tablet Take 1 tablet (1 mg total) by mouth daily. 30 tablet 11  . mometasone (NASONEX) 50 MCG/ACT nasal spray 2 sprays each nares q day 17 g 1  . MULTAQ 400 MG tablet TAKE ONE (1) TABLET BY MOUTH TWO (2) TIMES DAILY WITH A MEAL 60 tablet 10  . Multiple Vitamin (MULITIVITAMIN WITH MINERALS) TABS Take 1 tablet by mouth daily.      . nitroGLYCERIN (NITROSTAT) 0.4 MG SL tablet Place 1 tablet (0.4 mg total) under the tongue every 5 (five) minutes as needed for chest pain. 30 tablet 0  . ondansetron (ZOFRAN ODT) 8 MG disintegrating tablet 54m ODT q8 hours prn nausea 6 tablet 0  . pantoprazole (PROTONIX) 20 MG tablet Take 1 tablet (20 mg total) by mouth daily. 30 tablet 0  . Probiotic Product (PROBIOTIC PO) Take 1 tablet by mouth every other day. [place]3 billion    . rosuvastatin (CRESTOR) 10 MG tablet Take 1 tablet (10 mg total) by mouth daily. 30 tablet 5   No current facility-administered medications on file prior to visit.    Allergies:  Allergies  Allergen Reactions  . Amoxicillin Other (See Comments)    REACTION: n/v/d    Review of Systems:  CONSTITUTIONAL: No fevers, chills, night sweats, or weight loss.  EYES: No visual changes or eye pain ENT: No hearing changes.  No history of  nose bleeds.   RESPIRATORY: No cough, wheezing and shortness of breath.   CARDIOVASCULAR: Negative for chest pain, and palpitations.   GI: Negative for abdominal discomfort, blood in stools or black stools.  No recent change in bowel habits.   GU:  No history of incontinence.   MUSCLOSKELETAL: No history of joint pain or swelling.  No myalgias.   SKIN: Negative for lesions, rash, and itching.   ENDOCRINE: Negative for cold or heat intolerance, polydipsia or goiter.   PSYCH:  No depression or anxiety symptoms.   NEURO: As Above.   Vital Signs:  BP 104/60 mmHg  Pulse 64  Wt 156 lb (70.761 kg)  SpO2 97%     Neurological Exam: MENTAL STATUS including orientation to time, place, person, recent and remote memory, attention span and concentration, language, and fund of knowledge is normal.  Speech is not dysarthric.  CRANIAL NERVES:  Pupils equal round and reactive to light.  Normal conjugate, extra-ocular eye movements in all directions of gaze.  No ptosis. Face is symmetric. Palate elevates symmetrically.  Tongue is midline.  MOTOR:  Motor strength is 5/5 in all extremities.  No pronator drift.  Tone is normal.    MSRs:  Reflexes are 2+/4 throughout, except absent Achilles.  COORDINATION/GAIT:  Gait narrow based and stable.   Data: MRI/A brain 03/19/2015: IMPRESSION: 1. Moderate atrophy and white matter disease likely reflects the sequela of chronic microvascular ischemia. 2. No acute cranial abnormality. 3. Focal plaque at the origin of the right internal carotid artery without a significant stenosis relative to the more distal vessels. 4. Moderate to high-grade stenosis of the distal right A1 segment. The anterior communicating artery is patent in both A2 segments fill from the left. 5. Moderate narrowing of proximal and 2 branches bilaterally, more prominently on the right. 6. Distal vessel attenuation compatible with small vessel disease.  This is worse on the right in the posterior circulation.  Lab Results  Component Value Date   TSH 2.00 08/06/2014    IMPRESSION/PLAN: Chronic daily headaches  - MRI/A brain shows age-related changes, moderate atrophy, as well as moderate stenosis of right A1 artery, but there is collateral flow from the left  - Start gabapentin 151m at bedtime and increase to 1 tab BID over one week   Gait instability  - Check vitamin B12  Post-viral syndrome  - Encouraged him to stay well hydrated  - If no improvement, f/u with PCP  Return to clinic in 2 months  The duration of this appointment visit was 25 minutes of face-to-face time with the  patient.  Greater than 50% of this time was spent in counseling, explanation of diagnosis, planning of further management, and coordination of care.   Thank you for allowing me to participate in patient's care.  If I can answer any additional questions, I would be pleased to do so.    Sincerely,    Namari Breton K. PPosey Pronto DO

## 2015-05-14 ENCOUNTER — Telehealth: Payer: Self-pay | Admitting: *Deleted

## 2015-05-14 ENCOUNTER — Encounter: Payer: Self-pay | Admitting: Gastroenterology

## 2015-05-14 ENCOUNTER — Encounter: Payer: Self-pay | Admitting: *Deleted

## 2015-05-14 NOTE — Telephone Encounter (Signed)
Pre-Visit Call completed with patient and chart updated.   Pre-Visit Info documented in Specialty Comments under [employer].    

## 2015-05-17 ENCOUNTER — Encounter: Payer: Self-pay | Admitting: Internal Medicine

## 2015-05-17 ENCOUNTER — Ambulatory Visit (INDEPENDENT_AMBULATORY_CARE_PROVIDER_SITE_OTHER): Payer: Medicare Other | Admitting: Internal Medicine

## 2015-05-17 VITALS — BP 112/64 | HR 65 | Temp 97.7°F | Ht 74.0 in | Wt 156.2 lb

## 2015-05-17 DIAGNOSIS — I2510 Atherosclerotic heart disease of native coronary artery without angina pectoris: Secondary | ICD-10-CM | POA: Diagnosis not present

## 2015-05-17 DIAGNOSIS — R7309 Other abnormal glucose: Secondary | ICD-10-CM | POA: Diagnosis not present

## 2015-05-17 DIAGNOSIS — R739 Hyperglycemia, unspecified: Secondary | ICD-10-CM

## 2015-05-17 DIAGNOSIS — R7303 Prediabetes: Secondary | ICD-10-CM

## 2015-05-17 DIAGNOSIS — Z23 Encounter for immunization: Secondary | ICD-10-CM

## 2015-05-17 DIAGNOSIS — G4489 Other headache syndrome: Secondary | ICD-10-CM | POA: Diagnosis not present

## 2015-05-17 NOTE — Patient Instructions (Signed)
Go to the lab.

## 2015-05-17 NOTE — Progress Notes (Signed)
Pre visit review using our clinic review tool, if applicable. No additional management support is needed unless otherwise documented below in the visit note. 

## 2015-05-17 NOTE — Progress Notes (Signed)
Subjective:    Patient ID: [name], male    DOB: 19-Oct-1930, 81 y.o.   MRN: [id number]  DOS:  05/17/2015 Type of visit - description : To get established, previously see Dr. Linna Darner Interval history: Headaches, recently seen with persisting headaches, status post workup, symptoms eventually stopped. He thinks HAs decreased  because he has been using Flonase consistently, neurology tried gabapentin but he took it only temporarily and self discontinue the medication. CAD, last visit with cardiology 09-2014, felt to be stable. Mild anxiety, mostly related to the health of his wife Colon Branch if his weight is appropriate, BMI is 20, weight has been stable, patient reassured. Atrial fibrillation, good compliance of medication, asymptomatic.    Review of Systems Denied chest pain or difficulty breathing No nausea, vomiting, diarrhea No GERD symptoms, dysphagia or odynophagia.  Past Medical History  Diagnosis Date  . Mixed hyperlipidemia   . UNSPECIFIED ANEMIA   . CAD     a. 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  [name] Atrial fibrillation   . PREMATURE VENTRICULAR CONTRACTIONS     a. After CABG - was on Coumadin/Multaq for a period of time. Coumadin discontinued 04/2010 after maintaining NSR.  [name] Esophageal reflux   . IBS   . HYPERPLASIA, PROSTATE NOS W/URINARY OBST/LUTS   . LUMBAR RADICULOPATHY, RIGHT   . OSTEOPOROSIS   . INGUINAL HERNIA   . Lichen planus [id number]    [name]  . Diverticulosis 2011    Diverticulitis 2004  . PONV (postoperative nausea and vomiting)   . Headache     saw neuro 4-16, MRI done    Past Surgical History  Procedure Laterality Date  . Knee surgery Right 1977    repair  . Hip fracture surgery Right 03/2007    Dr. Salvadore Farber  . Inguinal hernia repair Left 2012    Dr Brantley Stage  . Coronary artery bypass graft      2 VD;anomalous RCA;post op compliacted by AF  . Colonoscopy  2011      Diverticulosis; Dr Deatra Ina  . Prostate biopsy  09/19/2013    [facility]  . Cataract extraction Right 10/06/2013    History   Social History  . Marital Status: Married    Spouse Name: N/A  . Number of Children: 0  . Years of Education: N/A   Occupational History  . retired    Social History Main Topics  . Smoking status: Former Smoker    Types: Cigarettes    Quit date: 11/27/1962  . Smokeless tobacco: Never Used  . Alcohol Use: 0.0 oz/week    0 Standard drinks or equivalent per week     Comment:  no wine recently  . Drug Use: No  . Sexual Activity: Not Currently   Other Topics Concern  . Not on file   Social History Narrative   Lives at [facility] in independent living.     His wife lives in the skilled nursing area.     Has no children.  Family: nephews-nices in [place]   Retired from [employer].     Still drives.         Medication List       This list is accurate as of: 05/17/15 11:59 PM.  Always use your most recent med list.               cholecalciferol 1000 UNITS tablet  Commonly known as:  VITAMIN D  Take 1,000 Units by mouth every morning.     ELIQUIS 5 MG Tabs tablet  Generic drug:  apixaban  TAKE ONE (1) TABLET BY MOUTH TWO (2) TIMES DAILY     folic acid 1 MG tablet  Commonly known as:  FOLVITE  Take 1 tablet (1 mg total) by mouth daily.     mometasone 50 MCG/ACT nasal spray  Commonly known as:  NASONEX  2 sprays each nares q day     MULTAQ 400 MG tablet  Generic drug:  dronedarone  TAKE ONE (1) TABLET BY MOUTH TWO (2) TIMES DAILY WITH A MEAL     multivitamin with minerals Tabs tablet  Take 1 tablet by mouth daily.     nitroGLYCERIN 0.4 MG SL tablet  Commonly known as:  NITROSTAT  Place 1 tablet (0.4 mg total) under the tongue every 5 (five) minutes as needed for chest pain.     pantoprazole 20 MG tablet  Commonly known as:  PROTONIX  Take 1 tablet (20 mg total) by mouth daily.     PROBIOTIC PO  Take 1 tablet by  mouth every other day. [place] 3 billion     rosuvastatin 10 MG tablet  Commonly known as:  CRESTOR  Take 1 tablet (10 mg total) by mouth daily.     TUMS PO  Take 1 tablet by mouth 2 (two) times daily as needed. For calcium           Objective:   Physical Exam BP 112/64 mmHg  Pulse 65  Temp(Src) 97.7 F (36.5 C) (Oral)  Ht 6\' 2"  (1.88 m)  Wt 156 lb 4 oz (70.875 kg)  BMI 20.05 kg/m2  SpO2 98%  General:   Well developed, well nourished . NAD.  HEENT:  Normocephalic . Face symmetric, atraumatic Neck: Normal carotid arteries Lungs:  CTA B Normal respiratory effort, no intercostal retractions, no accessory muscle use. Heart: RRR,  no murmur.  no pretibial edema bilaterally  Abdomen:  Not distended, soft, non-tender. Small, palpable aorta. Not tender, no bruit.  Skin: Not pale. Not jaundice Neurologic:  alert & oriented X3.  Speech normal, gait appropriate for age and unassisted Psych--  Cognition and judgment appear intact.  Cooperative with normal attention span and concentration.  Behavior appropriate. No anxious or depressed appearing.      Assessment & Plan:    Cardiovascular: History of CAD, atrial fibrillation. The patient is anticoagulated, heart rate 65, he seems to be doing very well  Cholesterol, on Crestor, last FLP very good.  Hyperglycemia, Last A1c very good, recheck one today  Headaches, Few months ago developed headaches, MRI/MRA showing no acute changes, saw neurology, prescribed gabapentin, eventually headaches went away 2 weeks ago, patient thinks because consistently used flonase and  decrease on the pollen in the environment. He self discontinue gabapentin, felt it wasn't helping.  Mild anxiety, His wife has dementia, he is afraid he "will go first", he however has all the documentation and paperwork in order, patient is reassured.  Palpable aorta, no bruit, previous ultrasound show no AAA  Primary care: Recommend Prevnar  today

## 2015-05-18 LAB — HEMOGLOBIN A1C: Hgb A1c MFr Bld: 5.9 % (ref 4.6–6.5)

## 2015-05-25 ENCOUNTER — Ambulatory Visit: Payer: Medicare Other | Admitting: Neurology

## 2015-07-07 ENCOUNTER — Telehealth: Payer: Self-pay | Admitting: Internal Medicine

## 2015-07-07 NOTE — Telephone Encounter (Signed)
Called to follow up with patient.  Left a message for call back.  Routed to provider for Tift.

## 2015-07-07 NOTE — Telephone Encounter (Signed)
Pt called back, best # [phone]

## 2015-07-07 NOTE — Telephone Encounter (Signed)
Patient Name: [name]  DOB: Jul 25, 1930    Initial Comment caller states he has stiffness in neck in the mornings and shortness of breath 2 -3 times a day   Nurse Assessment  Nurse: Julien Girt, RN, Almyra Free Date/Time Eilene Ghazi Time): 07/07/2015 11:17:12 AM  Confirm and document reason for call. If symptomatic, describe symptoms. ---Caller states he has had neck pain in the mornings and has to "sigh" several times a day. States the "sighing and shortness of breath" has been going on for months and the neck stiffness has been going on for "years." In the last 2 weeks he only has neck pain in the morning, that actually wakes him up. The neck pain resolves within 2 hrs but he still has mild stiffness. The shortness of breath seems worse every day. Adds a week ago he spit out "some color", beige-yellow after visiting his wife.  Has the patient traveled out of the country within the last 30 days? ---Not Applicable  Does the patient require triage? ---Yes  Related visit to physician within the last 2 weeks? ---No  Does the PT have any chronic conditions? (i.e. diabetes, asthma, etc.) ---Yes  List chronic conditions. ---Hx neck pain and stiffness, htn, A fib     Guidelines    Guideline Title Affirmed Question Affirmed Notes  Breathing Difficulty [1] MILD difficulty breathing (e.g., minimal/no SOB at rest, SOB with walking, pulse <100) AND [2] NEW-onset or WORSE than normal now states sob started 2 weeks ago   Final Disposition User   See Physician within 4 Hours (or PCP triage) Julien Girt, RN, Almyra Free    Comments  Called denies sob at this time and does not want a late appt. He is asking to come in tomorrow so that his appt does not interfere with visiting his wife. Advised I would send this note to Dr. Larose Kells and he can expect a call back. Verbalized understanding.   Referrals  REFERRED TO PCP OFFICE   Disagree/Comply: Disagree  Disagree/Comply Reason: Disagree with instructions

## 2015-07-07 NOTE — Telephone Encounter (Signed)
Patient verbalized that he was returning the call from earlier today. Per the patient he's been having some coughing and for the past 2-3 days it was a little yellow, however today it was clear. At this time the patient states "feeling ok and has not had any shortness of breath since 10:30 AM". An appointment  has been scheduled for tomorrow, 07/08/15 at 4:00 PM. Patient advised to seek an urgent care or emergency department if his symptoms worsen before his appointment. He understood and did not have any further questions or concerns at the end of call.

## 2015-07-07 NOTE — Telephone Encounter (Signed)
noted 

## 2015-07-08 ENCOUNTER — Ambulatory Visit (INDEPENDENT_AMBULATORY_CARE_PROVIDER_SITE_OTHER): Payer: Medicare Other | Admitting: Family Medicine

## 2015-07-08 ENCOUNTER — Ambulatory Visit (HOSPITAL_BASED_OUTPATIENT_CLINIC_OR_DEPARTMENT_OTHER)
Admission: RE | Admit: 2015-07-08 | Discharge: 2015-07-08 | Disposition: A | Discharge status: Home / Self Care | Payer: Medicare Other | Source: Ambulatory Visit | Attending: Family Medicine | Admitting: Family Medicine

## 2015-07-08 ENCOUNTER — Encounter: Payer: Self-pay | Admitting: Family Medicine

## 2015-07-08 VITALS — BP 116/70 | HR 69 | Temp 98.0°F | Wt 158.0 lb

## 2015-07-08 DIAGNOSIS — R05 Cough: Secondary | ICD-10-CM

## 2015-07-08 DIAGNOSIS — M542 Cervicalgia: Secondary | ICD-10-CM | POA: Diagnosis not present

## 2015-07-08 DIAGNOSIS — M47812 Spondylosis without myelopathy or radiculopathy, cervical region: Secondary | ICD-10-CM | POA: Diagnosis not present

## 2015-07-08 DIAGNOSIS — M8580 Other specified disorders of bone density and structure, unspecified site: Secondary | ICD-10-CM | POA: Diagnosis not present

## 2015-07-08 DIAGNOSIS — J069 Acute upper respiratory infection, unspecified: Secondary | ICD-10-CM | POA: Diagnosis not present

## 2015-07-08 DIAGNOSIS — R0602 Shortness of breath: Secondary | ICD-10-CM | POA: Diagnosis not present

## 2015-07-08 DIAGNOSIS — J449 Chronic obstructive pulmonary disease, unspecified: Secondary | ICD-10-CM | POA: Insufficient documentation

## 2015-07-08 DIAGNOSIS — R059 Cough, unspecified: Secondary | ICD-10-CM

## 2015-07-08 MED ORDER — LORATADINE 10 MG PO TABS: 10.0000 mg | ORAL_TABLET | Freq: Every day | ORAL | Status: DC

## 2015-07-08 NOTE — Assessment & Plan Note (Signed)
Hx of some stenosis on xray -- see xray done few years ago of c spine Repeat xray today

## 2015-07-08 NOTE — Progress Notes (Signed)
Patient ID: [name], male   DOB: June 02, 1930, 81 y.o.   MRN: [id number]   Subjective:    Patient ID: [name], male    DOB: [birth year], 81 y.o.   MRN: [id number]  Chief Complaint  Patient presents with  . URI    x's a few days c/o spitting and blowing discolored phlegm, sinus drainage and sneezing  . Neck Pain    c/o pain to the back of the neck when waking up in the morning x's a few days    HPI Patient is in today for neck and hip pain -- hx of stenosis in past.  Pain is now in his arms.  See xrays from few years ago of neck. He is also c/o congesttion, sob and clear mucous, no fevers x few days.  No otc meds.  Pt is very anxious.   Past Medical History  Diagnosis Date  . Mixed hyperlipidemia   . UNSPECIFIED ANEMIA   . CAD     a. 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  [name] Atrial fibrillation   . PREMATURE VENTRICULAR CONTRACTIONS     a. After CABG - was on Coumadin/Multaq for a period of time. Coumadin discontinued 04/2010 after maintaining NSR.  [name] Esophageal reflux   . IBS   . HYPERPLASIA, PROSTATE NOS W/URINARY OBST/LUTS   . LUMBAR RADICULOPATHY, RIGHT   . OSTEOPOROSIS   . INGUINAL HERNIA   . Lichen planus [id number]    [name]  . Diverticulosis 2011    Diverticulitis 2004  . PONV (postoperative nausea and vomiting)   . Headache     saw neuro 4-16, MRI done    Past Surgical History  Procedure Laterality Date  . Knee surgery Right 1977    repair  . Hip fracture surgery Right 03/2007    Dr. Salvadore Farber  . Inguinal hernia repair Left 2012    Dr Brantley Stage  . Coronary artery bypass graft      2 VD;anomalous RCA;post op compliacted by AF  . Colonoscopy  2011    Diverticulosis; Dr Deatra Ina  . Prostate biopsy  09/19/2013    [facility]  . Cataract extraction Right 10/06/2013    Family History  Problem Relation Age of Onset  . Breast cancer Sister     Deceased  . Stroke Mother   .  Pancreatic cancer Father     Deceased, 35  . Heart disease Sister 21    Social History   Social History  . Marital Status: Married    Spouse Name: N/A  . Number of Children: 0  . Years of Education: N/A   Occupational History  . retired    Social History Main Topics  . Smoking status: Former Smoker    Types: Cigarettes    Quit date: 11/27/1962  . Smokeless tobacco: Never Used  . Alcohol Use: 0.0 oz/week    0 Standard drinks or equivalent per week     Comment:  no wine recently  . Drug Use: No  . Sexual Activity: Not Currently   Other Topics Concern  . Not on file   Social History Narrative   Lives at [place] in independent living.     His wife lives in the skilled nursing area.     Has no children.  Family: nephews-nices in [place]   Retired from [employer].     Still drives.  Outpatient Prescriptions Prior to Visit  Medication Sig Dispense Refill  . Calcium Carbonate Antacid (TUMS PO) Take 1 tablet by mouth 2 (two) times daily as needed. For calcium     . cholecalciferol (VITAMIN D) 1000 UNITS tablet Take 1,000 Units by mouth every morning.      [name] ELIQUIS 5 MG TABS tablet TAKE ONE (1) TABLET BY MOUTH TWO (2) TIMES DAILY 60 tablet 10  . folic acid (FOLVITE) 1 MG tablet Take 1 tablet (1 mg total) by mouth daily. 30 tablet 11  . mometasone (NASONEX) 50 MCG/ACT nasal spray 2 sprays each nares q day 17 g 1  . MULTAQ 400 MG tablet TAKE ONE (1) TABLET BY MOUTH TWO (2) TIMES DAILY WITH A MEAL 60 tablet 10  . Multiple Vitamin (MULITIVITAMIN WITH MINERALS) TABS Take 1 tablet by mouth daily.      . pantoprazole (PROTONIX) 20 MG tablet Take 1 tablet (20 mg total) by mouth daily. 30 tablet 0  . Probiotic Product (PROBIOTIC PO) Take 1 tablet by mouth every other day. [place] 3 billion    . rosuvastatin (CRESTOR) 10 MG tablet Take 1 tablet (10 mg total) by mouth daily. 30 tablet 5  . nitroGLYCERIN (NITROSTAT) 0.4 MG SL tablet Place 1 tablet (0.4 mg total) under  the tongue every 5 (five) minutes as needed for chest pain. (Patient not taking: Reported on 05/17/2015) 30 tablet 0   No facility-administered medications prior to visit.    Allergies  Allergen Reactions  . Amoxicillin Other (See Comments)    REACTION: n/v/d    Review of Systems  Constitutional: Negative for fever and malaise/fatigue.  HENT: Negative for congestion.   Eyes: Negative for discharge.  Respiratory: Positive for cough, sputum production and shortness of breath.   Cardiovascular: Negative for chest pain, palpitations and leg swelling.  Gastrointestinal: Negative for nausea and abdominal pain.  Genitourinary: Negative for dysuria.  Musculoskeletal: Positive for neck pain. Negative for falls.  Skin: Negative for rash.  Neurological: Negative for loss of consciousness and headaches.  Endo/Heme/Allergies: Negative for environmental allergies.  Psychiatric/Behavioral: Negative for depression. The patient is nervous/anxious.        Objective:    Physical Exam  Constitutional: He is oriented to person, place, and time. Vital signs are normal. He appears well-developed and well-nourished. He is sleeping.  HENT:  Head: Normocephalic and atraumatic.  Mouth/Throat: Oropharynx is clear and moist.  Eyes: EOM are normal. Pupils are equal, round, and reactive to light.  Neck: Normal range of motion. Neck supple. No thyromegaly present.  Cardiovascular: Normal rate and regular rhythm.   No murmur heard. Pulmonary/Chest: Effort normal and breath sounds normal. No respiratory distress. He has no wheezes. He has no rales. He exhibits no tenderness.  Musculoskeletal: He exhibits no edema or tenderness.  Neurological: He is alert and oriented to person, place, and time.  Skin: Skin is warm and dry.  Psychiatric: His mood appears anxious. He does not exhibit a depressed mood.    BP 116/70 mmHg  Pulse 69  Temp(Src) 98 F (36.7 C) (Oral)  Wt 158 lb (71.668 kg)  SpO2 98% Wt  Readings from Last 3 Encounters:  07/08/15 158 lb (71.668 kg)  05/17/15 156 lb 4 oz (70.875 kg)  03/23/15 156 lb (70.761 kg)     Lab Results  Component Value Date   WBC 9.0 03/08/2015   HGB 13.7 03/08/2015   HCT 41.2 03/08/2015   PLT 143* 03/08/2015   GLUCOSE 142* 03/08/2015  CHOL 116 10/27/2014   TRIG 95.0 10/27/2014   HDL 50.70 10/27/2014   LDLCALC 46 10/27/2014   ALT 19 10/27/2014   AST 22 10/27/2014   NA 137 03/08/2015   K 4.2 03/08/2015   CL 103 03/08/2015   CREATININE 0.76 03/08/2015   BUN 17 03/08/2015   CO2 25 03/08/2015   TSH 2.00 08/06/2014   PSA 3.15 08/08/2013   INR 1.15 12/28/2013   HGBA1C 5.9 05/17/2015    Lab Results  Component Value Date   TSH 2.00 08/06/2014   Lab Results  Component Value Date   WBC 9.0 03/08/2015   HGB 13.7 03/08/2015   HCT 41.2 03/08/2015   MCV 94.1 03/08/2015   PLT 143* 03/08/2015   Lab Results  Component Value Date   NA 137 03/08/2015   K 4.2 03/08/2015   CO2 25 03/08/2015   GLUCOSE 142* 03/08/2015   BUN 17 03/08/2015   CREATININE 0.76 03/08/2015   BILITOT 0.6 10/27/2014   ALKPHOS 48 10/27/2014   AST 22 10/27/2014   ALT 19 10/27/2014   PROT 7.2 10/27/2014   ALBUMIN 4.4 10/27/2014   CALCIUM 8.7 03/08/2015   ANIONGAP 9 03/08/2015   GFR 76.95 08/06/2014   Lab Results  Component Value Date   CHOL 116 10/27/2014   Lab Results  Component Value Date   HDL 50.70 10/27/2014   Lab Results  Component Value Date   LDLCALC 46 10/27/2014   Lab Results  Component Value Date   TRIG 95.0 10/27/2014   Lab Results  Component Value Date   CHOLHDL 2 10/27/2014   Lab Results  Component Value Date   HGBA1C 5.9 05/17/2015       Assessment & Plan:   Problem List Items Addressed This Visit    None    Visit Diagnoses    Cough    -  Primary    Relevant Orders    DG Chest 2 View    Neck pain        Relevant Orders    DG Cervical Spine Complete       I am having Mr. Cajamarca start on loratadine. I am also  having him maintain his multivitamin with minerals, cholecalciferol, Calcium Carbonate Antacid (TUMS PO), nitroGLYCERIN, Probiotic Product (PROBIOTIC PO), folic acid, ELIQUIS, MULTAQ, rosuvastatin, pantoprazole, and mometasone.  Meds ordered this encounter  Medications  . loratadine (CLARITIN) 10 MG tablet    Sig: Take 1 tablet (10 mg total) by mouth daily.    Dispense:  30 tablet    Refill:  [place], DO

## 2015-07-08 NOTE — Patient Instructions (Signed)
Upper Respiratory Infection, Adult An upper respiratory infection (URI) is also sometimes known as the common cold. The upper respiratory tract includes the nose, sinuses, throat, trachea, and bronchi. Bronchi are the airways leading to the lungs. Most people improve within 1 week, but symptoms can last up to 2 weeks. A residual cough may last even longer.  CAUSES Many different viruses can infect the tissues lining the upper respiratory tract. The tissues become irritated and inflamed and often become very moist. Mucus production is also common. A cold is contagious. You can easily spread the virus to others by oral contact. This includes kissing, sharing a glass, coughing, or sneezing. Touching your mouth or nose and then touching a surface, which is then touched by another person, can also spread the virus. SYMPTOMS  Symptoms typically develop 1 to 3 days after you come in contact with a cold virus. Symptoms vary from person to person. They may include:  Runny nose.  Sneezing.  Nasal congestion.  Sinus irritation.  Sore throat.  Loss of voice (laryngitis).  Cough.  Fatigue.  Muscle aches.  Loss of appetite.  Headache.  Low-grade fever. DIAGNOSIS  You might diagnose your own cold based on familiar symptoms, since most people get a cold 2 to 3 times a year. Your caregiver can confirm this based on your exam. Most importantly, your caregiver can check that your symptoms are not due to another disease such as strep throat, sinusitis, pneumonia, asthma, or epiglottitis. Blood tests, throat tests, and X-rays are not necessary to diagnose a common cold, but they may sometimes be helpful in excluding other more serious diseases. Your caregiver will decide if any further tests are required. RISKS AND COMPLICATIONS  You may be at risk for a more severe case of the common cold if you smoke cigarettes, have chronic heart disease (such as heart failure) or lung disease (such as asthma), or if  you have a weakened immune system. The very young and very old are also at risk for more serious infections. Bacterial sinusitis, middle ear infections, and bacterial pneumonia can complicate the common cold. The common cold can worsen asthma and chronic obstructive pulmonary disease (COPD). Sometimes, these complications can require emergency medical care and may be life-threatening. PREVENTION  The best way to protect against getting a cold is to practice good hygiene. Avoid oral or hand contact with people with cold symptoms. Wash your hands often if contact occurs. There is no clear evidence that vitamin C, vitamin E, echinacea, or exercise reduces the chance of developing a cold. However, it is always recommended to get plenty of rest and practice good nutrition. TREATMENT  Treatment is directed at relieving symptoms. There is no cure. Antibiotics are not effective, because the infection is caused by a virus, not by bacteria. Treatment may include:  Increased fluid intake. Sports drinks offer valuable electrolytes, sugars, and fluids.  Breathing heated mist or steam (vaporizer or shower).  Eating chicken soup or other clear broths, and maintaining good nutrition.  Getting plenty of rest.  Using gargles or lozenges for comfort.  Controlling fevers with ibuprofen or acetaminophen as directed by your caregiver.  Increasing usage of your inhaler if you have asthma. Zinc gel and zinc lozenges, taken in the first 24 hours of the common cold, can shorten the duration and lessen the severity of symptoms. Pain medicines may help with fever, muscle aches, and throat pain. A variety of non-prescription medicines are available to treat congestion and runny nose. Your caregiver   can make recommendations and may suggest nasal or lung inhalers for other symptoms.  HOME CARE INSTRUCTIONS   Only take over-the-counter or prescription medicines for pain, discomfort, or fever as directed by your  caregiver.  Use a warm mist humidifier or inhale steam from a shower to increase air moisture. This may keep secretions moist and make it easier to breathe.  Drink enough water and fluids to keep your urine clear or pale yellow.  Rest as needed.  Return to work when your temperature has returned to normal or as your caregiver advises. You may need to stay home longer to avoid infecting others. You can also use a face mask and careful hand washing to prevent spread of the virus. SEEK MEDICAL CARE IF:   After the first few days, you feel you are getting worse rather than better.  You need your caregiver's advice about medicines to control symptoms.  You develop chills, worsening shortness of breath, or brown or red sputum. These may be signs of pneumonia.  You develop yellow or brown nasal discharge or pain in the face, especially when you bend forward. These may be signs of sinusitis.  You develop a fever, swollen neck glands, pain with swallowing, or white areas in the back of your throat. These may be signs of strep throat. SEEK IMMEDIATE MEDICAL CARE IF:   You have a fever.  You develop severe or persistent headache, ear pain, sinus pain, or chest pain.  You develop wheezing, a prolonged cough, cough up blood, or have a change in your usual mucus (if you have chronic lung disease).  You develop sore muscles or a stiff neck. Document Released: 05/09/2001 Document Revised: 02/05/2012 Document Reviewed: 02/18/2014 Exit[employer] Patient Information 2015 [employer]. This information is not intended to replace advice given to you by your health care provider. Make sure you discuss any questions you have with your health care provider.  

## 2015-07-08 NOTE — Progress Notes (Signed)
Pre visit review using our clinic review tool, if applicable. No additional management support is needed unless otherwise documented below in the visit note. 

## 2015-07-30 DIAGNOSIS — H2512 Age-related nuclear cataract, left eye: Secondary | ICD-10-CM | POA: Diagnosis not present

## 2015-07-30 DIAGNOSIS — Z961 Presence of intraocular lens: Secondary | ICD-10-CM | POA: Diagnosis not present

## 2015-08-10 ENCOUNTER — Encounter: Payer: Self-pay | Admitting: Cardiovascular Disease

## 2015-08-27 ENCOUNTER — Ambulatory Visit (INDEPENDENT_AMBULATORY_CARE_PROVIDER_SITE_OTHER): Payer: Medicare Other | Admitting: Internal Medicine

## 2015-08-27 ENCOUNTER — Encounter: Payer: Self-pay | Admitting: Internal Medicine

## 2015-08-27 VITALS — BP 108/76 | HR 66 | Temp 98.1°F | Ht 74.0 in | Wt 158.2 lb

## 2015-08-27 DIAGNOSIS — M810 Age-related osteoporosis without current pathological fracture: Secondary | ICD-10-CM | POA: Diagnosis not present

## 2015-08-27 DIAGNOSIS — J411 Mucopurulent chronic bronchitis: Secondary | ICD-10-CM | POA: Diagnosis not present

## 2015-08-27 DIAGNOSIS — F418 Other specified anxiety disorders: Secondary | ICD-10-CM

## 2015-08-27 DIAGNOSIS — F419 Anxiety disorder, unspecified: Secondary | ICD-10-CM

## 2015-08-27 DIAGNOSIS — F329 Major depressive disorder, single episode, unspecified: Secondary | ICD-10-CM

## 2015-08-27 DIAGNOSIS — Z09 Encounter for follow-up examination after completed treatment for conditions other than malignant neoplasm: Secondary | ICD-10-CM

## 2015-08-27 DIAGNOSIS — J449 Chronic obstructive pulmonary disease, unspecified: Secondary | ICD-10-CM

## 2015-08-27 DIAGNOSIS — N5089 Other specified disorders of the male genital organs: Secondary | ICD-10-CM

## 2015-08-27 MED ORDER — ESCITALOPRAM OXALATE 5 MG PO TABS: 5.0000 mg | ORAL_TABLET | Freq: Every day | ORAL | Status: DC

## 2015-08-27 NOTE — Assessment & Plan Note (Signed)
COPD: Chest x-ray few days ago showed changes of COPD, he smoked x 30 years but quit in 1964. Has daily cough. Likely has COPD. Will get PFTs Osteoporosis: Carries a diagnosis, recent x-ray of the neck--> osteopenia, no recent bone density test. Order dexa Depression, anxiety: See HPI, c/o of fatigue, anxiety and depression symptoms for a month. PHQ 9: 12  (mod depression). Explained the patient treatment options: counseling and medication. Many years ago he tried Zoloft but he didn't like it.  At this time I would recommend a low dose of Lexapro 5 mg, information about our  Counselors provided, follow-up in one month. Risk of suicidality discussed.

## 2015-08-27 NOTE — Progress Notes (Signed)
Pre visit review using our clinic review tool, if applicable. No additional management support is needed unless otherwise documented below in the visit note. 

## 2015-08-27 NOTE — Progress Notes (Signed)
Subjective:    Patient ID: [name], male    DOB: Aug 09, 1930, 81 y.o.   MRN: [id number]  DOS:  08/27/2015 Type of visit - description : Acute visit Interval history: States that he simply does not feel good. On further questioning, he reports fatigue for one month and feeling depressed and anxious. Recently, a chest x-ray showed COPD and a neck x-ray osteopenia, he is concerned about his medical issues because "I don't like to die before my wife, she has dementia". As far as COPD, he reports daily cough mostly in the mornings, occasionally sputum production. No hemoptysis.   Review of Systems Denies fever, chills, weight loss or headaches. No chest pain or difficulty breathing. Occasional palpitations No nausea, vomiting, diarrhea. Denies suicidal ideas, sleep well most nights, sometimes he has difficulties.   Past Medical History  Diagnosis Date  . Mixed hyperlipidemia   . UNSPECIFIED ANEMIA   . CAD     a. 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  [name] Atrial fibrillation   . PREMATURE VENTRICULAR CONTRACTIONS     a. After CABG - was on Coumadin/Multaq for a period of time. Coumadin discontinued 04/2010 after maintaining NSR.  [name] Esophageal reflux   . IBS   . HYPERPLASIA, PROSTATE NOS W/URINARY OBST/LUTS   . LUMBAR RADICULOPATHY, RIGHT   . OSTEOPOROSIS   . INGUINAL HERNIA   . Lichen planus [id number]    [name]  . Diverticulosis 2011    Diverticulitis 2004  . PONV (postoperative nausea and vomiting)   . Headache     saw neuro 4-16, MRI done    Past Surgical History  Procedure Laterality Date  . Knee surgery Right 1977    repair  . Hip fracture surgery Right 03/2007    Dr. Salvadore Farber  . Inguinal hernia repair Left 2012    Dr Brantley Stage  . Coronary artery bypass graft      2 VD;anomalous RCA;post op compliacted by AF  . Colonoscopy  2011    Diverticulosis; Dr Deatra Ina  . Prostate biopsy  09/19/2013     [facility]  . Cataract extraction Right 10/06/2013    Social History   Social History  . Marital Status: Married    Spouse Name: N/A  . Number of Children: 0  . Years of Education: N/A   Occupational History  . retired    Social History Main Topics  . Smoking status: Former Smoker    Types: Cigarettes    Quit date: 11/27/1962  . Smokeless tobacco: Never Used  . Alcohol Use: 0.0 oz/week    0 Standard drinks or equivalent per week     Comment:  no wine recently  . Drug Use: No  . Sexual Activity: Not Currently   Other Topics Concern  . Not on file   Social History Narrative   Lives at [place] in independent living.     His wife lives in the skilled nursing area.     Has no children.  Family: nephews-nices in [place]   Retired from [employer].     Still drives.         Medication List       This list is accurate as of: 08/27/15  5:58 PM.  Always use your most recent med list.               aspirin 81 MG tablet  Take 81 mg by mouth daily.     cholecalciferol 1000 UNITS tablet  Commonly known as:  VITAMIN D  Take 1,000 Units by mouth every morning.     ELIQUIS 5 MG Tabs tablet  Generic drug:  apixaban  TAKE ONE (1) TABLET BY MOUTH TWO (2) TIMES DAILY     escitalopram 5 MG tablet  Commonly known as:  LEXAPRO  Take 1 tablet (5 mg total) by mouth daily.     folic acid 1 MG tablet  Commonly known as:  FOLVITE  Take 1 tablet (1 mg total) by mouth daily.     loratadine 10 MG tablet  Commonly known as:  CLARITIN  Take 1 tablet (10 mg total) by mouth daily.     mometasone 50 MCG/ACT nasal spray  Commonly known as:  NASONEX  2 sprays each nares q day     MULTAQ 400 MG tablet  Generic drug:  dronedarone  TAKE ONE (1) TABLET BY MOUTH TWO (2) TIMES DAILY WITH A MEAL     multivitamin with minerals Tabs tablet  Take 1 tablet by mouth daily.     nitroGLYCERIN 0.4 MG SL tablet  Commonly known as:  NITROSTAT  Place 1 tablet (0.4 mg total)  under the tongue every 5 (five) minutes as needed for chest pain.     pantoprazole 20 MG tablet  Commonly known as:  PROTONIX  Take 1 tablet (20 mg total) by mouth daily.     PROBIOTIC PO  Take 1 tablet by mouth every other day. [place] 3 billion     rosuvastatin 10 MG tablet  Commonly known as:  CRESTOR  Take 1 tablet (10 mg total) by mouth daily.     TUMS PO  Take 1 tablet by mouth 2 (two) times daily as needed. For calcium           Objective:   Physical Exam BP 108/76 mmHg  Pulse 66  Temp(Src) 98.1 F (36.7 C) (Oral)  Ht 6\' 2"  (1.88 m)  Wt 158 lb 4 oz (71.782 kg)  BMI 20.31 kg/m2  SpO2 98% General:   Well developed, well nourished . NAD.  HEENT:  Normocephalic . Face symmetric, atraumatic Lungs:  Slightly decreased breath sounds but otherwise clear Normal respiratory effort, no intercostal retractions, no accessory muscle use. Heart: RRR,  no murmur.  No pretibial edema bilaterally  Skin: Not pale. Not jaundice Neurologic:  alert & oriented X3.  Speech normal, gait appropriate for age and unassisted Psych--  Cognition and judgment appear intact.  Cooperative with normal attention span and concentration.  Behavior appropriate. No anxious or depressed appearing.      Assessment & Plan:   Assessment>  Hyperlipidemia CAD COPD (per CXR 06-2015) DJD  P- Atrial fibrillation, PVCs. --Multaq, Elliquis  GERD, IBS. BPH Osteoporosis Lichen planus Headache -- saw neurology 02-2015, had a MRI/MRA (-)  Plan: COPD: Chest x-ray few days ago showed changes of COPD, he smoked x 30 years but quit in 1964. Has daily cough. Likely has COPD. Will get PFTs Osteoporosis: Carries a diagnosis, recent x-ray of the neck--> osteopenia, no recent bone density test. Order dexa Depression, anxiety: See HPI, c/o of fatigue, anxiety and depression symptoms for a month. PHQ 9: 12  (mod depression). Explained the patient treatment options: counseling and medication. Many years  ago he tried Zoloft but he didn't like it.  At this time I would recommend a low dose of Lexapro 5 mg, information about our  Counselors provided, follow-up in one month. Risk of suicidality discussed.

## 2015-08-27 NOTE — Patient Instructions (Signed)
Start taking Lexapro 5 mg every night  Watch for side effects such as nausea. Very rarely people start  thinking about suicide, if that is the case: stop the medication and call us Please come back in 4 weeks for re-evaluation.

## 2015-08-29 ENCOUNTER — Observation Stay (HOSPITAL_BASED_OUTPATIENT_CLINIC_OR_DEPARTMENT_OTHER)
Admission: EM | Admit: 2015-08-29 | Discharge: 2015-08-31 | Disposition: A | Discharge status: Home / Self Care | Payer: Medicare Other | Source: Home / Self Care | Attending: Emergency Medicine | Admitting: Emergency Medicine

## 2015-08-29 ENCOUNTER — Emergency Department (HOSPITAL_COMMUNITY)
Admission: EM | Admit: 2015-08-29 | Discharge: 2015-08-29 | Disposition: A | Discharge status: Home / Self Care | Payer: Medicare Other | Attending: Emergency Medicine | Admitting: Emergency Medicine

## 2015-08-29 ENCOUNTER — Encounter (HOSPITAL_BASED_OUTPATIENT_CLINIC_OR_DEPARTMENT_OTHER): Payer: Self-pay | Admitting: *Deleted

## 2015-08-29 ENCOUNTER — Encounter (HOSPITAL_COMMUNITY): Payer: Self-pay | Admitting: *Deleted

## 2015-08-29 ENCOUNTER — Emergency Department (HOSPITAL_BASED_OUTPATIENT_CLINIC_OR_DEPARTMENT_OTHER): Payer: Medicare Other

## 2015-08-29 DIAGNOSIS — F418 Other specified anxiety disorders: Secondary | ICD-10-CM | POA: Diagnosis not present

## 2015-08-29 DIAGNOSIS — R0789 Other chest pain: Secondary | ICD-10-CM | POA: Insufficient documentation

## 2015-08-29 DIAGNOSIS — F29 Unspecified psychosis not due to a substance or known physiological condition: Secondary | ICD-10-CM | POA: Diagnosis not present

## 2015-08-29 DIAGNOSIS — R002 Palpitations: Secondary | ICD-10-CM | POA: Diagnosis not present

## 2015-08-29 DIAGNOSIS — Z79899 Other long term (current) drug therapy: Secondary | ICD-10-CM | POA: Diagnosis not present

## 2015-08-29 DIAGNOSIS — Z7982 Long term (current) use of aspirin: Secondary | ICD-10-CM | POA: Diagnosis not present

## 2015-08-29 DIAGNOSIS — E782 Mixed hyperlipidemia: Secondary | ICD-10-CM | POA: Insufficient documentation

## 2015-08-29 DIAGNOSIS — R05 Cough: Secondary | ICD-10-CM | POA: Diagnosis not present

## 2015-08-29 DIAGNOSIS — I2510 Atherosclerotic heart disease of native coronary artery without angina pectoris: Secondary | ICD-10-CM | POA: Diagnosis present

## 2015-08-29 DIAGNOSIS — R112 Nausea with vomiting, unspecified: Secondary | ICD-10-CM | POA: Diagnosis not present

## 2015-08-29 DIAGNOSIS — R110 Nausea: Secondary | ICD-10-CM | POA: Diagnosis not present

## 2015-08-29 DIAGNOSIS — I480 Paroxysmal atrial fibrillation: Secondary | ICD-10-CM | POA: Insufficient documentation

## 2015-08-29 DIAGNOSIS — Z87891 Personal history of nicotine dependence: Secondary | ICD-10-CM | POA: Diagnosis not present

## 2015-08-29 DIAGNOSIS — Z951 Presence of aortocoronary bypass graft: Secondary | ICD-10-CM | POA: Insufficient documentation

## 2015-08-29 DIAGNOSIS — J449 Chronic obstructive pulmonary disease, unspecified: Secondary | ICD-10-CM | POA: Diagnosis not present

## 2015-08-29 DIAGNOSIS — R6889 Other general symptoms and signs: Secondary | ICD-10-CM | POA: Diagnosis not present

## 2015-08-29 DIAGNOSIS — R0602 Shortness of breath: Secondary | ICD-10-CM | POA: Diagnosis not present

## 2015-08-29 DIAGNOSIS — I25119 Atherosclerotic heart disease of native coronary artery with unspecified angina pectoris: Secondary | ICD-10-CM | POA: Diagnosis not present

## 2015-08-29 DIAGNOSIS — K219 Gastro-esophageal reflux disease without esophagitis: Secondary | ICD-10-CM | POA: Insufficient documentation

## 2015-08-29 DIAGNOSIS — R079 Chest pain, unspecified: Secondary | ICD-10-CM | POA: Diagnosis not present

## 2015-08-29 DIAGNOSIS — Z7901 Long term (current) use of anticoagulants: Secondary | ICD-10-CM | POA: Diagnosis not present

## 2015-08-29 LAB — CBC WITH DIFFERENTIAL/PLATELET
BASOS ABS: 0 10*3/uL (ref 0.0–0.1)
Basophils Relative: 0 %
EOS ABS: 0.2 10*3/uL (ref 0.0–0.7)
EOS PCT: 2 %
HCT: 39.6 % (ref 39.0–52.0)
HEMOGLOBIN: 13.5 g/dL (ref 13.0–17.0)
LYMPHS ABS: 1.1 10*3/uL (ref 0.7–4.0)
LYMPHS PCT: 15 %
MCH: 30.4 pg (ref 26.0–34.0)
MCHC: 34.1 g/dL (ref 30.0–36.0)
MCV: 89.2 fL (ref 78.0–100.0)
Monocytes Absolute: 0.5 10*3/uL (ref 0.1–1.0)
Monocytes Relative: 7 %
NEUTROS PCT: 76 %
Neutro Abs: 5.5 10*3/uL (ref 1.7–7.7)
PLATELETS: 150 10*3/uL (ref 150–400)
RBC: 4.44 MIL/uL (ref 4.22–5.81)
RDW: 16 % — ABNORMAL HIGH (ref 11.5–15.5)
WBC: 7.3 10*3/uL (ref 4.0–10.5)

## 2015-08-29 LAB — BASIC METABOLIC PANEL
ANION GAP: 9 (ref 5–15)
BUN: 16 mg/dL (ref 6–20)
CHLORIDE: 99 mmol/L — AB (ref 101–111)
CO2: 25 mmol/L (ref 22–32)
Calcium: 8.9 mg/dL (ref 8.9–10.3)
Creatinine, Ser: 0.78 mg/dL (ref 0.61–1.24)
Glucose, Bld: 115 mg/dL — ABNORMAL HIGH (ref 65–99)
POTASSIUM: 3.8 mmol/L (ref 3.5–5.1)
SODIUM: 133 mmol/L — AB (ref 135–145)

## 2015-08-29 LAB — TROPONIN I: Troponin I: <0.03 ng/mL (ref <0.031)

## 2015-08-29 LAB — I-STAT TROPONIN, ED: TROPONIN I, POC: 0 ng/mL (ref 0.00–0.08)

## 2015-08-29 MED ORDER — LORATADINE 10 MG PO TABS
10.0000 mg | ORAL_TABLET | Freq: Every day | ORAL | Status: DC
  Administered 2015-08-29 – 2015-08-31 (×3): 10 mg via ORAL
  Filled 2015-08-29 (×3): qty 1

## 2015-08-29 MED ORDER — SODIUM CHLORIDE 0.9 % IV SOLN
INTRAVENOUS | Status: DC
  Administered 2015-08-29 – 2015-08-31 (×3): via INTRAVENOUS

## 2015-08-29 MED ORDER — VITAMIN D 1000 UNITS PO TABS
1000.0000 [IU] | ORAL_TABLET | Freq: Every day | ORAL | Status: DC
  Administered 2015-08-29 – 2015-08-31 (×3): 1000 [IU] via ORAL
  Filled 2015-08-29 (×3): qty 1

## 2015-08-29 MED ORDER — FOLIC ACID 1 MG PO TABS
1.0000 mg | ORAL_TABLET | Freq: Every day | ORAL | Status: DC
  Administered 2015-08-29 – 2015-08-31 (×3): 1 mg via ORAL
  Filled 2015-08-29 (×3): qty 1

## 2015-08-29 MED ORDER — OXYCODONE HCL 5 MG PO TABS: 5.0000 mg | ORAL_TABLET | ORAL | Status: DC | PRN

## 2015-08-29 MED ORDER — APIXABAN 2.5 MG PO TABS
5.0000 mg | ORAL_TABLET | Freq: Two times a day (BID) | ORAL | Status: DC
  Administered 2015-08-29 – 2015-08-31 (×4): 5 mg via ORAL
  Filled 2015-08-29 (×4): qty 2

## 2015-08-29 MED ORDER — ONDANSETRON HCL 4 MG/2ML IJ SOLN: 4.0000 mg | Freq: Three times a day (TID) | INTRAMUSCULAR | Status: AC | PRN

## 2015-08-29 MED ORDER — PANTOPRAZOLE SODIUM 20 MG PO TBEC
20.0000 mg | DELAYED_RELEASE_TABLET | Freq: Every day | ORAL | Status: DC
  Administered 2015-08-29 – 2015-08-31 (×3): 20 mg via ORAL
  Filled 2015-08-29 (×3): qty 1

## 2015-08-29 MED ORDER — NITROGLYCERIN 0.4 MG SL SUBL: 0.4000 mg | SUBLINGUAL_TABLET | SUBLINGUAL | Status: DC | PRN

## 2015-08-29 MED ORDER — ASPIRIN EC 325 MG PO TBEC
325.0000 mg | DELAYED_RELEASE_TABLET | Freq: Every day | ORAL | Status: DC
  Administered 2015-08-29 – 2015-08-31 (×3): 325 mg via ORAL
  Filled 2015-08-29 (×3): qty 1

## 2015-08-29 MED ORDER — ACETAMINOPHEN 325 MG PO TABS: 650.0000 mg | ORAL_TABLET | Freq: Four times a day (QID) | ORAL | Status: DC | PRN

## 2015-08-29 MED ORDER — DRONEDARONE HCL 400 MG PO TABS
400.0000 mg | ORAL_TABLET | Freq: Two times a day (BID) | ORAL | Status: DC
  Administered 2015-08-30 – 2015-08-31 (×3): 400 mg via ORAL
  Filled 2015-08-29 (×6): qty 1

## 2015-08-29 MED ORDER — ROSUVASTATIN CALCIUM 10 MG PO TABS
10.0000 mg | ORAL_TABLET | Freq: Every day | ORAL | Status: DC
  Administered 2015-08-29 – 2015-08-30 (×2): 10 mg via ORAL
  Filled 2015-08-29 (×2): qty 1

## 2015-08-29 MED ORDER — ONDANSETRON HCL 4 MG/2ML IJ SOLN
4.0000 mg | Freq: Once | INTRAMUSCULAR | Status: AC
Start: 2015-08-29 — End: 2015-08-29
  Administered 2015-08-29: 4 mg via INTRAVENOUS
  Filled 2015-08-29: qty 2

## 2015-08-29 MED ORDER — HYDROMORPHONE HCL 1 MG/ML IJ SOLN: 0.5000 mg | INTRAMUSCULAR | Status: DC | PRN

## 2015-08-29 MED ORDER — PROMETHAZINE HCL 25 MG/ML IJ SOLN
12.5000 mg | Freq: Four times a day (QID) | INTRAMUSCULAR | Status: DC | PRN
  Administered 2015-08-29: 12.5 mg via INTRAVENOUS
  Filled 2015-08-29: qty 1

## 2015-08-29 MED ORDER — ALUM & MAG HYDROXIDE-SIMETH 200-200-20 MG/5ML PO SUSP: 30.0000 mL | Freq: Four times a day (QID) | ORAL | Status: DC | PRN

## 2015-08-29 MED ORDER — NITROGLYCERIN 2 % TD OINT
0.5000 [in_us] | TOPICAL_OINTMENT | Freq: Four times a day (QID) | TRANSDERMAL | Status: DC
  Administered 2015-08-29 – 2015-08-31 (×4): 0.5 [in_us] via TOPICAL
  Filled 2015-08-29: qty 30

## 2015-08-29 MED ORDER — ACETAMINOPHEN 650 MG RE SUPP: 650.0000 mg | Freq: Four times a day (QID) | RECTAL | Status: DC | PRN

## 2015-08-29 MED ORDER — FLUTICASONE PROPIONATE 50 MCG/ACT NA SUSP
1.0000 | Freq: Every day | NASAL | Status: DC
  Administered 2015-08-29 – 2015-08-31 (×3): 1 via NASAL
  Filled 2015-08-29: qty 16

## 2015-08-29 NOTE — ED Notes (Signed)
Tolerated PO fluids well.

## 2015-08-29 NOTE — ED Notes (Signed)
Pt up to bathroom, ambulated well, gait steady, required min assistance

## 2015-08-29 NOTE — ED Notes (Signed)
Pt's church minister will be taking pt home post discharge, please reach him at [phone] ([place])

## 2015-08-29 NOTE — ED Notes (Signed)
Pt states "they drew blood from me this morning, 2 vials."

## 2015-08-29 NOTE — ED Provider Notes (Signed)
CSN: [id number]     Arrival date & time 08/29/15  2353 History   First MD Initiated Contact with Patient 08/29/15 [phone]     Chief Complaint  Patient presents with  . Nausea     (Consider location/radiation/quality/duration/timing/severity/associated sxs/prior Treatment) HPI  She started on Lexapro recently states that after he takes it he gets palpitations, anxiety and increase in energy because of difficult sleeping. This last time he had nausea with it as well as he came here for evaluation. No fevers, chills, chest pain. He does endorse he has some mild right perispinal thoracic pain. No other complaints.  Past Medical History  Diagnosis Date  . Mixed hyperlipidemia   . UNSPECIFIED ANEMIA   . CAD     a. 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  [name] Atrial fibrillation ([place])   . PREMATURE VENTRICULAR CONTRACTIONS     a. After CABG - was on Coumadin/Multaq for a period of time. Coumadin discontinued 04/2010 after maintaining NSR.  [name] Esophageal reflux   . IBS   . HYPERPLASIA, PROSTATE NOS W/URINARY OBST/LUTS   . LUMBAR RADICULOPATHY, RIGHT   . OSTEOPOROSIS   . INGUINAL HERNIA   . Lichen planus [id number]    [name]  . Diverticulosis 2011    Diverticulitis 2004  . PONV (postoperative nausea and vomiting)   . Headache     saw neuro 4-16, MRI done   Past Surgical History  Procedure Laterality Date  . Knee surgery Right 1977    repair  . Hip fracture surgery Right 03/2007    Dr. Salvadore Farber  . Inguinal hernia repair Left 2012    Dr Brantley Stage  . Coronary artery bypass graft      2 VD;anomalous RCA;post op compliacted by AF  . Colonoscopy  2011    Diverticulosis; Dr Deatra Ina  . Prostate biopsy  09/19/2013    [facility]  . Cataract extraction Right 10/06/2013   Family History  Problem Relation Age of Onset  . Breast cancer Sister     Deceased  . Stroke Mother   . Pancreatic cancer Father    Deceased, 45  . Heart disease Sister 3   Social History  Substance Use Topics  . Smoking status: Former Smoker    Types: Cigarettes    Quit date: 11/27/1962  . Smokeless tobacco: Never Used  . Alcohol Use: 0.0 oz/week    0 Standard drinks or equivalent per week     Comment:  no wine recently    Review of Systems  Constitutional: Negative for fever and chills.  HENT: Negative for congestion and drooling.   Eyes: Negative for pain.  Cardiovascular: Positive for palpitations. Negative for chest pain and leg swelling.  Gastrointestinal: Positive for nausea. Negative for vomiting and abdominal pain.  Endocrine: Negative for polydipsia and polyuria.  Musculoskeletal: Positive for back pain. Negative for gait problem.  Skin: Negative for color change and rash.  Psychiatric/Behavioral:       Anxiey  All other systems reviewed and are negative.     Allergies  Amoxicillin  Home Medications   Prior to Admission medications   Medication Sig Start Date End Date Taking? Authorizing Provider  aspirin 81 MG tablet Take 81 mg by mouth daily.    Historical Provider, MD  Calcium Carbonate Antacid (TUMS PO) Take 1 tablet by mouth 2 (two) times daily as needed. For calcium  Historical Provider, MD  cholecalciferol (VITAMIN D) 1000 UNITS tablet Take 1,000 Units by mouth every morning.      Historical Provider, MD  ELIQUIS 5 MG TABS tablet TAKE ONE (1) TABLET BY MOUTH TWO (2) TIMES DAILY 01/25/15   Josue Hector, MD  escitalopram (LEXAPRO) 5 MG tablet Take 1 tablet (5 mg total) by mouth daily. 08/27/15   Colon Branch, MD  folic acid (FOLVITE) 1 MG tablet Take 1 tablet (1 mg total) by mouth daily. 10/26/14   Josue Hector, MD  loratadine (CLARITIN) 10 MG tablet Take 1 tablet (10 mg total) by mouth daily. 07/08/15   Rosalita Chessman, DO  mometasone (NASONEX) 50 MCG/ACT nasal spray 2 sprays each nares q day 03/09/15   Edward Saguier, PA-C  MULTAQ 400 MG tablet TAKE ONE (1) TABLET BY MOUTH TWO (2)  TIMES DAILY WITH A MEAL 01/25/15   Josue Hector, MD  Multiple Vitamin (MULITIVITAMIN WITH MINERALS) TABS Take 1 tablet by mouth daily.      Historical Provider, MD  nitroGLYCERIN (NITROSTAT) 0.4 MG SL tablet Place 1 tablet (0.4 mg total) under the tongue every 5 (five) minutes as needed for chest pain. Patient not taking: Reported on 05/17/2015 12/27/13 05/24/15  Jonetta Osgood, MD  pantoprazole (PROTONIX) 20 MG tablet Take 1 tablet (20 mg total) by mouth daily. 03/08/15   April Palumbo, MD  Probiotic Product (PROBIOTIC PO) Take 1 tablet by mouth every other day. [place] 3 billion    Historical Provider, MD  rosuvastatin (CRESTOR) 10 MG tablet Take 1 tablet (10 mg total) by mouth daily. 02/24/15   Edward Saguier, PA-C   BP 155/80 mmHg  Pulse 76  Temp(Src) 97.7 F (36.5 C) (Oral)  Resp 18  Ht 6\' 2"  (1.88 m)  Wt 158 lb (71.668 kg)  BMI 20.28 kg/m2  SpO2 100% Physical Exam  Constitutional: He is oriented to person, place, and time. He appears well-developed and well-nourished.  HENT:  Head: Normocephalic and atraumatic.  Eyes: Conjunctivae and EOM are normal.  Neck: Normal range of motion. Neck supple.  Cardiovascular: Normal rate and regular rhythm.   Pulmonary/Chest: Effort normal. No respiratory distress.  Abdominal: Soft. There is no tenderness.  Musculoskeletal: Normal range of motion. He exhibits no edema or tenderness.  Neurological: He is alert and oriented to person, place, and time.  Skin: Skin is warm and dry.  Nursing note and vitals reviewed.   ED Course  Procedures (including critical care time) Labs Review Labs Reviewed  CBC WITH DIFFERENTIAL/PLATELET - Abnormal; Notable for the following:    RDW 16.0 (*)    All other components within normal limits  BASIC METABOLIC PANEL - Abnormal; Notable for the following:    Sodium 133 (*)    Chloride 99 (*)    Glucose, Bld 115 (*)    All other components within normal limits  TROPONIN I  TROPONIN I    Imaging  Review Dg Chest 2 View  08/29/2015   CLINICAL DATA:  Chest pain with cough and shortness of breath  EXAM: CHEST  2 VIEW  COMPARISON:  July 08, 2015 and December 28, 2013  FINDINGS: There is generalized interstitial thickening with scattered areas of scarring, stable. There is a probable small granuloma in the left upper lobe, stable. There is no frank edema or consolidation. The heart size and pulmonary vascularity are normal. No adenopathy. Patient is status post coronary artery bypass grafting. No adenopathy. There are several thoracic vertebral  body compression fractures, stable.  IMPRESSION: Underlying parenchymal scarring and interstitial thickening, likely sequelae of COPD as noted previously. No new opacity. No edema or consolidation. No change in cardiac silhouette.   Electronically Signed   By: Lowella Grip III M.D.   On: 08/29/2015 07:46   I have personally reviewed and evaluated these images and lab results as part of my medical decision-making.   EKG Interpretation   Date/Time:  Sunday August 29 2015 07:18:07 EDT Ventricular Rate:  64 PR Interval:  198 QRS Duration: 106 QT Interval:  444 QTC Calculation: 458 R Axis:   35 Text Interpretation:  Sinus rhythm with marked sinus arrhythmia with  occasional Premature ventricular complexes Incomplete right bundle branch  block Borderline ECG No significant change since last tracing Confirmed by  [facility] MD, Corene Cornea [phone]) on 08/29/2015 7:36:41 AM      MDM   Final diagnoses:  Palpitations   Intermittent palpitations and nausea after starting lexapro. H/o extensive CAD, will ensure low risk that it is cardiac related with delta troponins, ecg and cxr. If these ok, will advise d/c lexapro and fu w/ pcp.  Symptoms resolved in the emergency Department no nausea, able tolerate by mouth, delta troponin is negative. Rest of labs and imaging negative as well. Patient will continue to follow up with his doctor for other antidepressants  that might be helpful.  I have personally and contemperaneously reviewed labs and imaging and used in my decision making as above.   A medical screening exam was performed and I feel the patient has had an appropriate workup for their chief complaint at this time and likelihood of emergent condition existing is low. They have been counseled on decision, discharge, follow up and which symptoms necessitate immediate return to the emergency department. They or their family verbally stated understanding and agreement with plan and discharged in stable condition.      Merrily Pew, MD 08/29/15 1153

## 2015-08-29 NOTE — ED Notes (Addendum)
Per [facility], pt from [facility], was seen @ [facility] this morning.  Started taking Lexapro a few days ago and has been feeling sick since.  [facility] recommended he f/u with PCP.  Has a 20 g left hand, was given zofran 4 mg.

## 2015-08-29 NOTE — ED Notes (Signed)
Pt appears to be resting quietly, eyes closed, sr x 2 up, callbell within reach

## 2015-08-29 NOTE — ED Notes (Signed)
Pt resting quietly, PO fluids provided

## 2015-08-29 NOTE — ED Notes (Signed)
Pt arrived by [facility] from [facility]. Pt c/o nausea that started yesterday evening. States that he didn't feel sick until after he took his dose of lexapro on an empty stomach. States this is a new medication for him. States this was the 2nd dose he has taken. States he felt "anxious" all night. States he had a recent xray and the MD told him that he thought he had COPD which is bothering him and making him anxious. States he needs to be healthy to take care of his wife who also lives  at [place]. Denies any cp/sob at present.

## 2015-08-29 NOTE — ED Notes (Signed)
MD at bedside. 

## 2015-08-29 NOTE — ED Notes (Signed)
Pt's another contact: Rev. Vassie Loll--- tel# [phone].

## 2015-08-29 NOTE — ED Notes (Signed)
Patient transported to X-ray via stretcher, sr x 2 up

## 2015-08-29 NOTE — ED Notes (Signed)
Pt c/o being nauseated this am, no abdominal pain

## 2015-08-29 NOTE — ED Provider Notes (Signed)
CSN: [id number]     Arrival date & time 08/29/15  1646 History   First MD Initiated Contact with Patient 08/29/15 1752     Chief Complaint  Patient presents with  . Nausea     (Consider location/radiation/quality/duration/timing/severity/associated sxs/prior Treatment) HPI  PCP: Kathlene November, MD PMH: unspecified anemia, CAD, atrial fibrillation, PVC, reflux, IBS, lumbar radiculopathy, osteoporosis, inguinal hernia, lichen planus, diverticulosis, headache,   [name] is a 81 y.o.  male  CHIEF COMPLAINT: nausea, vomiting, CP, and weakness.  Time of onset: Patient had chest pressure a few days ago and went to see his doctor, his doctor started him on Lexapro 2 days ago -- he developed nausea after the first dose of Lexapro. He was seen in the ER at [facility] earlier today and had delta Trop and a cardiac work-up done which was normal. He was feeling not a hundred percent but much improved when he was discharged. After being at home he developed worsening nausea and CP, he had multiple episodes of coughing of phlegm and dry heaving. He says this relieved the pressure from his stomach but the nausea is still there. He is able to walk but feels weak. He reports feeling so poorly he was afraid to go through the night feeling how he does now. Th Lexapro was dc at the earlier visit and he has not had a dose today   ROS: The patient denies diaphoresis, fever, headache, weakness (general or focal), confusion, change of vision,  dysphagia, aphagia, shortness of breath,  abdominal pains,  diarrhea, lower extremity swelling, rash, neck pain.   Past Medical History  Diagnosis Date  . Mixed hyperlipidemia   . UNSPECIFIED ANEMIA   . CAD     a. 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  [name] Atrial fibrillation ([place])   . PREMATURE VENTRICULAR CONTRACTIONS     a. After CABG - was on Coumadin/Multaq for a period of time. Coumadin  discontinued 04/2010 after maintaining NSR.  [name] Esophageal reflux   . IBS   . HYPERPLASIA, PROSTATE NOS W/URINARY OBST/LUTS   . LUMBAR RADICULOPATHY, RIGHT   . OSTEOPOROSIS   . INGUINAL HERNIA   . Lichen planus [id number]    [name]  . Diverticulosis 2011    Diverticulitis 2004  . PONV (postoperative nausea and vomiting)   . Headache     saw neuro 4-16, MRI done   Past Surgical History  Procedure Laterality Date  . Knee surgery Right 1977    repair  . Hip fracture surgery Right 03/2007    Dr. Salvadore Farber  . Inguinal hernia repair Left 2012    Dr Brantley Stage  . Coronary artery bypass graft      2 VD;anomalous RCA;post op compliacted by AF  . Colonoscopy  2011    Diverticulosis; Dr Deatra Ina  . Prostate biopsy  09/19/2013    [facility]  . Cataract extraction Right 10/06/2013   Family History  Problem Relation Age of Onset  . Breast cancer Sister     Deceased  . Stroke Mother   . Pancreatic cancer Father     Deceased, 63  . Heart disease Sister 45   Social History  Substance Use Topics  . Smoking status: Former Smoker    Types: Cigarettes    Quit date: 11/27/1962  . Smokeless tobacco: Never Used  . Alcohol Use: 0.0 oz/week    0 Standard drinks  or equivalent per week     Comment:  no wine recently    Review of Systems  10 Systems reviewed and are negative for acute change except as noted in the HPI.   Allergies  Amoxicillin  Home Medications   Prior to Admission medications   Medication Sig Start Date End Date Taking? Authorizing Provider  aspirin 81 MG tablet Take 81 mg by mouth daily.   Yes Historical Provider, MD  Calcium Carbonate Antacid (TUMS PO) Take 1 tablet by mouth 2 (two) times daily as needed (heartburn). For calcium   Yes Historical Provider, MD  cholecalciferol (VITAMIN D) 1000 UNITS tablet Take 1,000 Units by mouth every morning.     Yes Historical Provider, MD  ELIQUIS 5 MG TABS tablet TAKE ONE (1) TABLET BY MOUTH TWO (2) TIMES DAILY 01/25/15   Yes Josue Hector, MD  folic acid (FOLVITE) 1 MG tablet Take 1 tablet (1 mg total) by mouth daily. 10/26/14  Yes Josue Hector, MD  loratadine (CLARITIN) 10 MG tablet Take 1 tablet (10 mg total) by mouth daily. 07/08/15  Yes Yvonne R Lowne, DO  mometasone (NASONEX) 50 MCG/ACT nasal spray 2 sprays each nares q day Patient taking differently: Place 2 sprays into the nose daily as needed (congestion).  03/09/15  Yes Edward Saguier, PA-C  MULTAQ 400 MG tablet TAKE ONE (1) TABLET BY MOUTH TWO (2) TIMES DAILY WITH A MEAL 01/25/15  Yes Josue Hector, MD  Multiple Vitamin (MULITIVITAMIN WITH MINERALS) TABS Take 1 tablet by mouth daily.     Yes Historical Provider, MD  nitroGLYCERIN (NITROSTAT) 0.4 MG SL tablet Place 1 tablet (0.4 mg total) under the tongue every 5 (five) minutes as needed for chest pain. 12/27/13 08/29/15 Yes Shanker Kristeen Mans, MD  pantoprazole (PROTONIX) 20 MG tablet Take 1 tablet (20 mg total) by mouth daily. 03/08/15  Yes April Palumbo, MD  rosuvastatin (CRESTOR) 10 MG tablet Take 1 tablet (10 mg total) by mouth daily. 02/24/15  Yes Edward Saguier, PA-C  escitalopram (LEXAPRO) 5 MG tablet Take 1 tablet (5 mg total) by mouth daily. Patient not taking: Reported on 08/29/2015 08/27/15   Colon Branch, MD   BP 144/69 mmHg  Pulse 63  Temp(Src) 97.6 F (36.4 C) (Oral)  Resp 16  Ht 6\' 2"  (1.88 m)  Wt 160 lb (72.576 kg)  BMI 20.53 kg/m2  SpO2 99% Physical Exam  Constitutional: He appears well-developed and well-nourished. No distress.  HENT:  Head: Normocephalic and atraumatic.  Eyes: Pupils are equal, round, and reactive to light.  Neck: Normal range of motion. Neck supple.  Cardiovascular: Normal rate, regular rhythm and normal pulses.   Pulmonary/Chest: Effort normal and breath sounds normal. He has no decreased breath sounds. He has no wheezes.  Pressure to chest is no reproducible to palpation  Abdominal: Soft.  Musculoskeletal:  No LE swelling  Neurological: He is alert.  Skin:  Skin is warm and dry.  Nursing note and vitals reviewed.   ED Course  Procedures (including critical care time) [place], ED    Imaging Review Dg Chest 2 View  08/29/2015   CLINICAL DATA:  Chest pain with cough and shortness of breath  EXAM: CHEST  2 VIEW  COMPARISON:  July 08, 2015 and December 28, 2013  FINDINGS: There is generalized interstitial thickening with scattered areas of scarring, stable. There is a probable small granuloma in the left upper lobe, stable. There is no frank  edema or consolidation. The heart size and pulmonary vascularity are normal. No adenopathy. Patient is status post coronary artery bypass grafting. No adenopathy. There are several thoracic vertebral body compression fractures, stable.  IMPRESSION: Underlying parenchymal scarring and interstitial thickening, likely sequelae of COPD as noted previously. No new opacity. No edema or consolidation. No change in cardiac silhouette.   Electronically Signed   By: Lowella Grip III M.D.   On: 08/29/2015 07:46   I have personally reviewed and evaluated these images and lab results as part of my medical decision-making.   EKG Interpretation None     DUSTINE, STICKLER CB:[id number] 29-Aug-2015 19:10:21 [facility]-ED ROUTINE RECORD Sinus rhythm Probable left atrial enlargement RSR' in V1 or V2, right VCD or RVH 37mm/s 33mm/mV 150Hz  8.0 SP2 CID: [id number] Referred by: Unconfirmed Vent. rate 66 BPM PR interval 197 ms QRS duration 111 ms QT/QTc 435/456 ms P-R-T axes 58 28 58  MDM   Final diagnoses:  Nausea  Chest pressure  Non-intractable vomiting with nausea, vomiting of unspecified type    CBC WITH DIFFERENTIAL/PLATELET - Abnormal; Notable for the following:    RDW 16.0 (*)    All other components within normal limits  BASIC METABOLIC PANEL - Abnormal; Notable for the following:    Sodium 133 (*)    Chloride 99 (*)    Glucose, Bld 115 (*)     All other components within normal limits  TROPONIN I  TROPONIN I           Discussed case with Dr. Wilson Singer who feels that a cardiac r/o is reasonable and obs overnight. The patient is fortunately not still having chest pressure but says he still feels nauseous, weak, sick and is concerned about going home and the pressure returning.   I spoke with Dr. Arnoldo Morale who has agreed to obs for cycling Troponins overnight. Will put temp orders in for tele obs, [facility], [facility]  Filed Vitals:   08/29/15 1817  BP: 144/69  Pulse: 63  Temp:   Resp: [address] PA-C 08/29/15 2021  Virgel Manifold, MD 09/01/15 [phone]

## 2015-08-29 NOTE — H&P (Signed)
[facility] Admission History and Physical       [name] TGP:[id number] DOB: 10-04-1930 DOA: 08/29/2015  Referring physician: EDP PCP: Kathlene November, MD  Specialists:   Chief Complaint: Chest discomfort and Nausea  HPI: [name] is a 81 y.o. male with a history of CAD, Hyperlipidemia Atrial fibrillation on Eliquis Rx who presents to the Ed with complaints of Chest discomfort described as pressure  Rates at an 8/10 at the worst associated with nausea and an indigestion type feeling that did not get better until he vomited.    He presented to the [facility] ED in the Am and was seen and had 2 negative troponin levels and was discharged home,  He had another episode in the afternoon so he came to  [facility]. ED for evaluation.      Review of Systems:  Constitutional: No Weight Loss, No Weight Gain, Night Sweats, Fevers, Chills, Dizziness, Light Headedness, Fatigue, or Generalized Weakness HEENT: No Headaches, Difficulty Swallowing,Tooth/Dental Problems,Sore Throat,  No Sneezing, Rhinitis, Ear Ache, Nasal Congestion, or Post Nasal Drip,  Cardio-vascular:  No +Chest pain, Orthopnea, PND, Edema in Lower Extremities, Anasarca, Dizziness, Palpitations  Resp: No Dyspnea, No DOE, No Productive Cough, No Non-Productive Cough, No Hemoptysis, No Wheezing.    GI: No Heartburn, Indigestion, Abdominal Pain,+ Nausea, +Vomiting, Diarrhea, Constipation, Hematemesis, Hematochezia, Melena, Change in Bowel Habits,  Loss of Appetite  GU: No Dysuria, No Change in Color of Urine, No Urgency or Urinary Frequency, No Flank pain.  Musculoskeletal: No Joint Pain or Swelling, No Decreased Range of Motion, No Back Pain.  Neurologic: No Syncope, No Seizures, Muscle Weakness, Paresthesia, Vision Disturbance or Loss, No Diplopia, No Vertigo, No Difficulty Walking,  Skin: No Rash or Lesions. Psych: No Change in Mood or Affect, No Depression or Anxiety, No Memory loss, No Confusion, or  Hallucinations   Past Medical History  Diagnosis Date  . Mixed hyperlipidemia   . UNSPECIFIED ANEMIA   . CAD     a. 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  [name] Atrial fibrillation ([place])   . PREMATURE VENTRICULAR CONTRACTIONS     a. After CABG - was on Coumadin/Multaq for a period of time. Coumadin discontinued 04/2010 after maintaining NSR.  [name] Esophageal reflux   . IBS   . HYPERPLASIA, PROSTATE NOS W/URINARY OBST/LUTS   . LUMBAR RADICULOPATHY, RIGHT   . OSTEOPOROSIS   . INGUINAL HERNIA   . Lichen planus 3094    [name]  . Diverticulosis 2011    Diverticulitis 2004  . PONV (postoperative nausea and vomiting)   . Headache     saw neuro 4-16, MRI done     Past Surgical History  Procedure Laterality Date  . Knee surgery Right 1977    repair  . Hip fracture surgery Right 03/2007    Dr. Salvadore Farber  . Inguinal hernia repair Left 2012    Dr Brantley Stage  . Coronary artery bypass graft      2 VD;anomalous RCA;post op compliacted by AF  . Colonoscopy  2011    Diverticulosis; Dr Deatra Ina  . Prostate biopsy  09/19/2013    [facility]  . Cataract extraction Right 10/06/2013      Prior to Admission medications   Medication Sig Start Date End Date Taking? Authorizing Provider  aspirin 81 MG tablet Take 81 mg by mouth daily.   Yes Historical Provider, MD  Calcium Carbonate Antacid (TUMS PO) Take 1 tablet by mouth 2 (two) times daily as needed (heartburn). For calcium   Yes Historical Provider, MD  cholecalciferol (VITAMIN D) 1000 UNITS tablet Take 1,000 Units by mouth every morning.     Yes Historical Provider, MD  ELIQUIS 5 MG TABS tablet TAKE ONE (1) TABLET BY MOUTH TWO (2) TIMES DAILY 01/25/15  Yes Josue Hector, MD  folic acid (FOLVITE) 1 MG tablet Take 1 tablet (1 mg total) by mouth daily. 10/26/14  Yes Josue Hector, MD  loratadine (CLARITIN) 10 MG tablet Take 1 tablet (10 mg total) by mouth daily.  07/08/15  Yes Yvonne R Lowne, DO  mometasone (NASONEX) 50 MCG/ACT nasal spray 2 sprays each nares q day Patient taking differently: Place 2 sprays into the nose daily as needed (congestion).  03/09/15  Yes Edward Saguier, PA-C  MULTAQ 400 MG tablet TAKE ONE (1) TABLET BY MOUTH TWO (2) TIMES DAILY WITH A MEAL 01/25/15  Yes Josue Hector, MD  Multiple Vitamin (MULITIVITAMIN WITH MINERALS) TABS Take 1 tablet by mouth daily.     Yes Historical Provider, MD  nitroGLYCERIN (NITROSTAT) 0.4 MG SL tablet Place 1 tablet (0.4 mg total) under the tongue every 5 (five) minutes as needed for chest pain. 12/27/13 08/29/15 Yes Shanker Kristeen Mans, MD  pantoprazole (PROTONIX) 20 MG tablet Take 1 tablet (20 mg total) by mouth daily. 03/08/15  Yes April Palumbo, MD  rosuvastatin (CRESTOR) 10 MG tablet Take 1 tablet (10 mg total) by mouth daily. 02/24/15  Yes Edward Saguier, PA-C  escitalopram (LEXAPRO) 5 MG tablet Take 1 tablet (5 mg total) by mouth daily. Patient not taking: Reported on 08/29/2015 08/27/15   Colon Branch, MD     Allergies  Allergen Reactions  . Amoxicillin Other (See Comments)    REACTION: n/v/d    Social History:  reports that he quit smoking about 52 years ago. His smoking use included Cigarettes. He has never used smokeless tobacco. He reports that he drinks alcohol. He reports that he does not use illicit drugs.    Family History  Problem Relation Age of Onset  . Breast cancer Sister     Deceased  . Stroke Mother   . Pancreatic cancer Father     Deceased, 68  . Heart disease Sister 59       Physical Exam:  GEN:  Pleasant Elderly Well Developed 81 y.o. Caucasian male examined and in no acute distress; cooperative with exam Filed Vitals:   08/29/15 1655 08/29/15 1700 08/29/15 1817 08/29/15 2029  BP:  132/68 144/69 145/73  Pulse:  67 63 69  Temp:  97.6 F (36.4 C)  98.1 F (36.7 C)  TempSrc:  Oral  Oral  Resp:  20 16 18   Height:  6\' 2"  (1.88 m)    Weight:  72.576 kg (160 lb)     SpO2: 98% 98% 99% 97%   Blood pressure 145/73, pulse 69, temperature 98.1 F (36.7 C), temperature source Oral, resp. rate 18, height 6\' 2"  (1.88 m), weight 72.576 kg (160 lb), SpO2 97 %. PSYCH: He is alert and oriented x4; does not appear anxious does not appear depressed; affect is normal HEENT: Normocephalic and Atraumatic, Mucous membranes pink; PERRLA; EOM intact; Fundi:  Benign;  No scleral icterus, Nares: Patent, Oropharynx: Clear, Sparse Dentition,    Neck:  FROM, No Cervical Lymphadenopathy nor Thyromegaly or Carotid Bruit; No JVD; Breasts:: Not examined CHEST WALL: No tenderness CHEST: Normal respiration, clear  to auscultation bilaterally HEART: Regular rate and rhythm; no murmurs rubs or gallops BACK: No kyphosis or scoliosis; No CVA tenderness ABDOMEN: Positive Bowel Sounds, Obese, Soft Non-Tender, No Rebound or Guarding; No Masses, No Organomegaly, EXTREMITIES: No Cyanosis, Clubbing, or Edema; No Ulcerations. Genitalia: not examined PULSES: 2+ and symmetric SKIN: Normal hydration no rash or ulceration CNS:  Alert and Oriented x 4, No Focal Deficits Labs on Admission:  Basic Metabolic Panel:  Recent Labs Lab 08/29/15 0720  NA 133*  K 3.8  CL 99*  CO2 25  GLUCOSE 115*  BUN 16  CREATININE 0.78  CALCIUM 8.9   Liver Function Tests: No results for input(s): AST, ALT, ALKPHOS, BILITOT, PROT, ALBUMIN in the last 168 hours. No results for input(s): LIPASE, AMYLASE in the last 168 hours. No results for input(s): AMMONIA in the last 168 hours. CBC:  Recent Labs Lab 08/29/15 0720  WBC 7.3  NEUTROABS 5.5  HGB 13.5  HCT 39.6  MCV 89.2  PLT 150   Cardiac Enzymes:  Recent Labs Lab 08/29/15 0720 08/29/15 1036  TROPONINI <0.03 <0.03    BNP (last 3 results) No results for input(s): BNP in the last 8760 hours.  ProBNP (last 3 results) No results for input(s): PROBNP in the last 8760 hours.  CBG: No results for input(s): GLUCAP in the last 168  hours.  Radiological Exams on Admission: Dg Chest 2 View  08/29/2015   CLINICAL DATA:  Chest pain with cough and shortness of breath  EXAM: CHEST  2 VIEW  COMPARISON:  July 08, 2015 and December 28, 2013  FINDINGS: There is generalized interstitial thickening with scattered areas of scarring, stable. There is a probable small granuloma in the left upper lobe, stable. There is no frank edema or consolidation. The heart size and pulmonary vascularity are normal. No adenopathy. Patient is status post coronary artery bypass grafting. No adenopathy. There are several thoracic vertebral body compression fractures, stable.  IMPRESSION: Underlying parenchymal scarring and interstitial thickening, likely sequelae of COPD as noted previously. No new opacity. No edema or consolidation. No change in cardiac silhouette.   Electronically Signed   By: Lowella Grip III M.D.   On: 08/29/2015 07:46     EKG: Independently reviewed. Normal Sinus Rhythm Rate =66 No Acute S-T changes    Assessment/Plan:      80 y.o. male with  Principal Problem:   1.    Atypical chest pain/ Chest pain   Cardiac Monitoring   Cycle Troponins   Nitropaste, O2 ASA,     Continue Rosuvastatin Rx   Check Fasting Lipids and 2D ECHO    Active Problems:     2.   Coronary atherosclerosis   On Rosuvasatn Rx      3.   Nausea and vomiting   PRN IV Phenergan      4.    Paroxsymal Atrial Fibrillation    On Eliquis Rx   Cardiac monitoring     5.   Mixed hyperlipidemia   On Rosuvastatin Rx     6.   DVT Prophylaxis   On Eliquis Rx       Code Status:     FULL CODE        Family Communication:  No Family Present    Disposition Plan:    Inpatient  Observation Status        Time spent:  [facility] Pager [phone]   If [place]  Please Contact the Day Rounding Team MD for [facility]  If 7PM-7AM, Please Contact Night-Floor Coverage  [email or web address] Password  [email or web address] 08/29/2015, 9:15 PM     ADDENDUM:   Patient was seen and examined on 08/29/2015

## 2015-08-30 ENCOUNTER — Other Ambulatory Visit: Payer: Self-pay | Admitting: Internal Medicine

## 2015-08-30 ENCOUNTER — Observation Stay (HOSPITAL_BASED_OUTPATIENT_CLINIC_OR_DEPARTMENT_OTHER): Payer: Medicare Other

## 2015-08-30 DIAGNOSIS — I480 Paroxysmal atrial fibrillation: Secondary | ICD-10-CM

## 2015-08-30 DIAGNOSIS — R079 Chest pain, unspecified: Secondary | ICD-10-CM

## 2015-08-30 DIAGNOSIS — R0789 Other chest pain: Secondary | ICD-10-CM | POA: Insufficient documentation

## 2015-08-30 DIAGNOSIS — K219 Gastro-esophageal reflux disease without esophagitis: Secondary | ICD-10-CM | POA: Diagnosis not present

## 2015-08-30 DIAGNOSIS — R112 Nausea with vomiting, unspecified: Secondary | ICD-10-CM | POA: Diagnosis not present

## 2015-08-30 DIAGNOSIS — E782 Mixed hyperlipidemia: Secondary | ICD-10-CM | POA: Diagnosis not present

## 2015-08-30 DIAGNOSIS — I25119 Atherosclerotic heart disease of native coronary artery with unspecified angina pectoris: Secondary | ICD-10-CM | POA: Diagnosis not present

## 2015-08-30 LAB — BASIC METABOLIC PANEL
ANION GAP: 6 (ref 5–15)
BUN: 11 mg/dL (ref 6–20)
CALCIUM: 8.8 mg/dL — AB (ref 8.9–10.3)
CO2: 25 mmol/L (ref 22–32)
Chloride: 99 mmol/L — ABNORMAL LOW (ref 101–111)
Creatinine, Ser: 0.57 mg/dL — ABNORMAL LOW (ref 0.61–1.24)
GFR calc Af Amer: >60 mL/min (ref >60)
GLUCOSE: 104 mg/dL — AB (ref 65–99)
Potassium: 3.5 mmol/L (ref 3.5–5.1)
Sodium: 130 mmol/L — ABNORMAL LOW (ref 135–145)

## 2015-08-30 LAB — CBC
HCT: 37.4 % — ABNORMAL LOW (ref 39.0–52.0)
HEMOGLOBIN: 12.6 g/dL — AB (ref 13.0–17.0)
MCH: 30.1 pg (ref 26.0–34.0)
MCHC: 33.7 g/dL (ref 30.0–36.0)
MCV: 89.3 fL (ref 78.0–100.0)
Platelets: 165 10*3/uL (ref 150–400)
RBC: 4.19 MIL/uL — ABNORMAL LOW (ref 4.22–5.81)
RDW: 15.6 % — AB (ref 11.5–15.5)
WBC: 8.2 10*3/uL (ref 4.0–10.5)

## 2015-08-30 LAB — TROPONIN I: Troponin I: <0.03 ng/mL (ref <0.031)

## 2015-08-30 MED ORDER — BOOST / RESOURCE BREEZE PO LIQD
1.0000 | Freq: Two times a day (BID) | ORAL | Status: DC
  Administered 2015-08-30 – 2015-08-31 (×2): 1 via ORAL

## 2015-08-30 NOTE — Progress Notes (Signed)
[facility] PROGRESS NOTE  [name] HAF:[id number] DOB: Aug 27, 1930 DOA: 08/29/2015 PCP: Kathlene November, MD  Assessment/Plan: 81 y/o male with PMH of HPL, PAF on Eliquies, CAD h/o CABG presented with recurrent chest pains   1. Chest pain->resolved. Atypical chest pain. ACS ruled out. Trop-negative. But significant history. Pend echo. We ill ask cardiology for stress test evaluation.   -h/o CAD/CABG. Cont as above. ASA, statins. Not on BB  2. PAF on Eliques.  3. HPL. Cont statins.  4. GERD. Start PPI   Code Status: full Family Communication: d/w patient, tRN (indicate person spoken with, relationship, and if by phone, the number) Disposition Plan: home 24-48 hrs    Consultants:  cardiology   Procedures:  Pend echo   Antibiotics:  none (indicate start date, and stop date if known)  HPI/Subjective: alert  Objective: Filed Vitals:   08/30/15 0507  BP: 137/74  Pulse: 70  Temp: 97.7 F (36.5 C)  Resp: 18    Intake/Output Summary (Last 24 hours) at 08/30/15 1256 Last data filed at 08/30/15 1155  Gross per 24 hour  Intake  592.5 ml  Output   1225 ml  Net -632.5 ml   Filed Weights   08/29/15 1700 08/29/15 2100  Weight: 72.576 kg (160 lb) 68.72 kg (151 lb 8 oz)    Exam:   General:  No distress   Cardiovascular: s1,s2 rrr  Respiratory: CTA BL  Abdomen: soft, nt,nd   Musculoskeletal: no leg edema   Data Reviewed: Basic Metabolic Panel:  Recent Labs Lab 08/29/15 0720 08/30/15 0251  NA 133* 130*  K 3.8 3.5  CL 99* 99*  CO2 25 25  GLUCOSE 115* 104*  BUN 16 11  CREATININE 0.78 0.57*  CALCIUM 8.9 8.8*   Liver Function Tests: No results for input(s): AST, ALT, ALKPHOS, BILITOT, PROT, ALBUMIN in the last 168 hours. No results for input(s): LIPASE, AMYLASE in the last 168 hours. No results for input(s): AMMONIA in the last 168 hours. CBC:  Recent Labs Lab 08/29/15 0720 08/30/15 0251  WBC 7.3 8.2  NEUTROABS 5.5  --   HGB 13.5 12.6*  HCT  39.6 37.4*  MCV 89.2 89.3  PLT 150 165   Cardiac Enzymes:  Recent Labs Lab 08/29/15 0720 08/29/15 1036 08/29/15 2126 08/30/15 0251 08/30/15 0917  TROPONINI <0.03 <0.03 <0.03 <0.03 <0.03   BNP (last 3 results) No results for input(s): BNP in the last 8760 hours.  ProBNP (last 3 results) No results for input(s): PROBNP in the last 8760 hours.  CBG: No results for input(s): GLUCAP in the last 168 hours.  No results found for this or any previous visit (from the past 240 hour(s)).   Studies: Dg Chest 2 View  08/29/2015   CLINICAL DATA:  Chest pain with cough and shortness of breath  EXAM: CHEST  2 VIEW  COMPARISON:  July 08, 2015 and December 28, 2013  FINDINGS: There is generalized interstitial thickening with scattered areas of scarring, stable. There is a probable small granuloma in the left upper lobe, stable. There is no frank edema or consolidation. The heart size and pulmonary vascularity are normal. No adenopathy. Patient is status post coronary artery bypass grafting. No adenopathy. There are several thoracic vertebral body compression fractures, stable.  IMPRESSION: Underlying parenchymal scarring and interstitial thickening, likely sequelae of COPD as noted previously. No new opacity. No edema or consolidation. No change in cardiac silhouette.   Electronically Signed   By: Lowella Grip III M.D.  On: 08/29/2015 07:46    Scheduled Meds: . apixaban  5 mg Oral BID  . aspirin EC  325 mg Oral Daily  . cholecalciferol  1,000 Units Oral Daily  . dronedarone  400 mg Oral BID WC  . fluticasone  1 spray Each Nare Daily  . folic acid  1 mg Oral Daily  . loratadine  10 mg Oral Daily  . nitroGLYCERIN  0.5 inch Topical 4 times per day  . pantoprazole  20 mg Oral Daily  . rosuvastatin  10 mg Oral q1800   Continuous Infusions: . sodium chloride 75 mL/hr at 08/30/15 1022    Principal Problem:   Atypical chest pain Active Problems:   Mixed hyperlipidemia   Coronary  atherosclerosis   Nausea and vomiting   Chest pain   Chest pressure    Time spent: >35 minutes     Kinnie Feil  [facility] Pager [phone]. If 7PM-7AM, please contact night-coverage at [email or web address], password [email or web address] 08/30/2015, 12:56 PM

## 2015-08-30 NOTE — Progress Notes (Signed)
Initial Nutrition Assessment  DOCUMENTATION CODES:   Non-severe (moderate) malnutrition in context of chronic illness  INTERVENTION:  - Will order Boost Breeze BID, each supplement provides 250 kcal and 9 grams of protein - RD will continue to monitor for needs  NUTRITION DIAGNOSIS:   Inadequate oral intake related to acute illness, nausea as evidenced by per patient/family report.  GOAL:   Patient will meet greater than or equal to 90% of their needs  MONITOR:   PO intake, Supplement acceptance, Weight trends, Labs, I & O's  REASON FOR ASSESSMENT:   Malnutrition Screening Tool  ASSESSMENT:   81 y.o. male with a history of CAD, Hyperlipidemia Atrial fibrillation on Eliquis Rx who presents to the Ed with complaints of Chest discomfort described as pressure Rates at an 8/10 at the worst associated with nausea and an indigestion type feeling that did not get better until he vomited. He presented to the [facility] ED in the Am and was seen and had 2 negative troponin levels and was discharged home, He had another episode in the afternoon so he came to [facility]. ED for evaluation.  Pt seen for MST. BMI indicates normal weight status. Pt on CLD and states he had 1/2 broth, 1/2 jello, and a few sips of iced tea for lunch today which did not exacerbate nausea or cause abdominal pain. He states after a few bites/sips at breakfast he began to feel more nauseated. He denies emesis today and states the last time he vomited was yesterday evening.  Pt reports that at [facility] he had a good appetite and was previously eating his meals plus part of wife's meals. He noticed that he was gaining weight in abdominal area so he stopped eating part of her meals. He states that his weight has been stable recently which is confirmed by weight hx review. Physical assessment shows moderate muscle and fat wasting to upper body, lower body not assessed at this time.   Will order Boost Breeze  to supplement CLD and monitor for diet advancement. Pt feels that nausea and vomiting was associated with taking Lexapro in addition to feeling anxious about visitor from [place]. Not meeting needs currently. Medications reviewed. Labs reviewed; Na: 130 mmol/L, Cl: 99 mmol/L, creatinine low, Ca: 8.8 mg/dL.   Diet Order:  Diet clear liquid Room service appropriate?: Yes; Fluid consistency:: Thin  Skin:  Reviewed, no issues  Last BM:  10/2  Height:   Ht Readings from Last 1 Encounters:  08/29/15 6\' 2"  (1.88 m)    Weight:   Wt Readings from Last 1 Encounters:  08/29/15 151 lb 8 oz (68.72 kg)    Ideal Body Weight:  86.36 kg (kg)  BMI:  Body mass index is 19.44 kg/(m^2).  Estimated Nutritional Needs:   Kcal:  1400-1600  Protein:  60-70 grams  Fluid:  2-2.2 L/day  EDUCATION NEEDS:   No education needs identified at this time     Jarome Matin, RD, LDN Inpatient Clinical Dietitian Pager # [phone] After hours/weekend pager # [phone]

## 2015-08-30 NOTE — Progress Notes (Signed)
Received a call from [place] from [facility] ED asking that the Charge Nurse call Georgiana Spinner, Director of Residential Services at [place] at [phone] to confirm that their resident was in fact a patient in the hospital. It was told by [name] to the secretary on our department that the patient had been seen at [place] ED for nausea. The patient was discharged from there and checked in to the [place] ED at 1650 on 08/2015. Unsure how patient got from [facility] to [place]. The facility was not aware and had called [facility] looking for their resident. Writer called Georgiana Spinner and left two voice-mails with detail about this person being a patient on our department and the department's contact number was left if they need to call for further details.   [name] [facility] 08/30/2015 5:51 PM

## 2015-08-30 NOTE — Progress Notes (Signed)
Echocardiogram 2D Echocardiogram has been performed.  Tresa Res 08/30/2015, 1:06 PM

## 2015-08-31 ENCOUNTER — Encounter: Payer: Self-pay | Admitting: Internal Medicine

## 2015-08-31 DIAGNOSIS — K219 Gastro-esophageal reflux disease without esophagitis: Secondary | ICD-10-CM

## 2015-08-31 LAB — LIPID PANEL
CHOL/HDL RATIO: 1.5 ratio
Cholesterol: 86 mg/dL (ref 0–200)
HDL: 59 mg/dL (ref >40)
LDL CALC: 18 mg/dL (ref 0–99)
Triglycerides: 45 mg/dL (ref <150)
VLDL: 9 mg/dL (ref 0–40)

## 2015-08-31 MED ORDER — PANTOPRAZOLE SODIUM 20 MG PO TBEC: 20.0000 mg | DELAYED_RELEASE_TABLET | Freq: Every day | ORAL | Status: DC

## 2015-08-31 MED ORDER — NITROGLYCERIN 0.4 MG SL SUBL: 0.4000 mg | SUBLINGUAL_TABLET | SUBLINGUAL | Status: DC | PRN

## 2015-08-31 NOTE — Discharge Summary (Signed)
Physician Discharge Summary  [name]:[id number] DOB: 1930/11/15 DOA: 08/29/2015  PCP: Kathlene November, MD  Admit date: 08/29/2015 Discharge date: 08/31/2015  Time spent: >35 minutes  Recommendations for Outpatient Follow-up:  F/u with PCP in 3-7 days as needed F/u with cardiology in 1-2 weeks for stress est evaluation  Discharge Diagnoses:  Principal Problem:   Atypical chest pain Active Problems:   Mixed hyperlipidemia   Coronary atherosclerosis   Nausea and vomiting   Chest pain   Chest pressure   Discharge Condition: stable   Diet recommendation: heart healthy   Filed Weights   08/29/15 1700 08/29/15 2100  Weight: 72.576 kg (160 lb) 68.72 kg (151 lb 8 oz)    History of present illness:  81 y/o male with PMH of HPL, PAF on Eliquies, CAD h/o CABG presented with chest pains, associated with nausea and an indigestion type feeling that did not get better until he vomited. He related his symptoms to the recent new medication-escitalopram    Hospital Course:  1. Chest pain->resolved. Atypical chest pain. ? GERD-related. ACS ruled out. Trop-negative. Echo: LVEF 55%. Non wall motion abnormalities. D/w patient, he prefers to f/u with cardiology as outpatient for stress test evaluation.  -h/o CAD/CABG. ASA, statins. Not on BB  2. PAF on Eliques.  3. HPL. Cont statins.  4. GERD. Started PPI      Procedures:  echoStudy Conclusions  - Left ventricle: The cavity size was normal. Wall thickness was normal. Systolic function was normal. The estimated ejection fraction was in the range of 55% to 60%. Wall motion was normal; there were no regional wall motion abnormalities. Doppler parameters are consistent with abnormal left ventricular relaxation (grade 1 diastolic dysfunction). - Aortic valve: There was trivial regurgitation. - Right atrium: The atrium was mildly dilated.   (i.e. Studies not automatically included, echos, thoracentesis, etc; not  x-rays)  Consultations:  none  Discharge Exam: Filed Vitals:   08/31/15 1013  BP: 114/55  Pulse: 68  Temp: 98.3 F (36.8 C)  Resp: 20    General: alert. No distress  Cardiovascular: s1,s2 rrr Respiratory: CTA BL  Discharge Instructions  Discharge Instructions    Diet - low sodium heart healthy    Complete by:  As directed      Discharge instructions    Complete by:  As directed   Please follow up with cardiology in 1-2 weeks     Increase activity slowly    Complete by:  As directed             Medication List    STOP taking these medications        escitalopram 5 MG tablet  Commonly known as:  LEXAPRO      TAKE these medications        aspirin 81 MG tablet  Take 81 mg by mouth daily.     cholecalciferol 1000 UNITS tablet  Commonly known as:  VITAMIN D  Take 1,000 Units by mouth every morning.     ELIQUIS 5 MG Tabs tablet  Generic drug:  apixaban  TAKE ONE (1) TABLET BY MOUTH TWO (2) TIMES DAILY     folic acid 1 MG tablet  Commonly known as:  FOLVITE  Take 1 tablet (1 mg total) by mouth daily.     loratadine 10 MG tablet  Commonly known as:  CLARITIN  Take 1 tablet (10 mg total) by mouth daily.     mometasone 50 MCG/ACT nasal spray  Commonly known  as:  NASONEX  2 sprays each nares q day     MULTAQ 400 MG tablet  Generic drug:  dronedarone  TAKE ONE (1) TABLET BY MOUTH TWO (2) TIMES DAILY WITH A MEAL     multivitamin with minerals Tabs tablet  Take 1 tablet by mouth daily.     nitroGLYCERIN 0.4 MG SL tablet  Commonly known as:  NITROSTAT  Place 1 tablet (0.4 mg total) under the tongue every 5 (five) minutes as needed for chest pain.     pantoprazole 20 MG tablet  Commonly known as:  PROTONIX  Take 1 tablet (20 mg total) by mouth daily.     rosuvastatin 10 MG tablet  Commonly known as:  CRESTOR  Take 1 tablet (10 mg total) by mouth daily.     TUMS PO  Take 1 tablet by mouth 2 (two) times daily as needed (heartburn). For calcium        Allergies  Allergen Reactions  . Amoxicillin Other (See Comments)    REACTION: n/v/d       Follow-up Information    Follow up with Kathlene November, MD. Schedule an appointment as soon as possible for a visit in 1 week.   Specialty:  Internal Medicine   Contact information:   [address] [place] [address] [phone]        The results of significant diagnostics from this hospitalization (including imaging, microbiology, ancillary and laboratory) are listed below for reference.    Significant Diagnostic Studies: Dg Chest 2 View  08/29/2015   CLINICAL DATA:  Chest pain with cough and shortness of breath  EXAM: CHEST  2 VIEW  COMPARISON:  July 08, 2015 and December 28, 2013  FINDINGS: There is generalized interstitial thickening with scattered areas of scarring, stable. There is a probable small granuloma in the left upper lobe, stable. There is no frank edema or consolidation. The heart size and pulmonary vascularity are normal. No adenopathy. Patient is status post coronary artery bypass grafting. No adenopathy. There are several thoracic vertebral body compression fractures, stable.  IMPRESSION: Underlying parenchymal scarring and interstitial thickening, likely sequelae of COPD as noted previously. No new opacity. No edema or consolidation. No change in cardiac silhouette.   Electronically Signed   By: Lowella Grip III M.D.   On: 08/29/2015 07:46    Microbiology: No results found for this or any previous visit (from the past 240 hour(s)).   Labs: Basic Metabolic Panel:  Recent Labs Lab 08/29/15 0720 08/30/15 0251  NA 133* 130*  K 3.8 3.5  CL 99* 99*  CO2 25 25  GLUCOSE 115* 104*  BUN 16 11  CREATININE 0.78 0.57*  CALCIUM 8.9 8.8*   Liver Function Tests: No results for input(s): AST, ALT, ALKPHOS, BILITOT, PROT, ALBUMIN in the last 168 hours. No results for input(s): LIPASE, AMYLASE in the last 168 hours. No results for input(s): AMMONIA in the  last 168 hours. CBC:  Recent Labs Lab 08/29/15 0720 08/30/15 0251  WBC 7.3 8.2  NEUTROABS 5.5  --   HGB 13.5 12.6*  HCT 39.6 37.4*  MCV 89.2 89.3  PLT 150 165   Cardiac Enzymes:  Recent Labs Lab 08/29/15 0720 08/29/15 1036 08/29/15 2126 08/30/15 0251 08/30/15 0917  TROPONINI <0.03 <0.03 <0.03 <0.03 <0.03   BNP: BNP (last 3 results) No results for input(s): BNP in the last 8760 hours.  ProBNP (last 3 results) No results for input(s): PROBNP in the last 8760  hours.  CBG: No results for input(s): GLUCAP in the last 168 hours.     SignedKinnie Feil  [facility] 08/31/2015, 11:18 AM

## 2015-09-02 ENCOUNTER — Telehealth: Payer: Self-pay

## 2015-09-02 ENCOUNTER — Telehealth: Payer: Self-pay | Admitting: Cardiovascular Disease

## 2015-09-02 DIAGNOSIS — R079 Chest pain, unspecified: Secondary | ICD-10-CM

## 2015-09-02 NOTE — Telephone Encounter (Signed)
LM TO CALL BACK ./CY 

## 2015-09-02 NOTE — Telephone Encounter (Signed)
New message     Pt wants RN to call him to see when he can have a Stress test done

## 2015-09-02 NOTE — Telephone Encounter (Signed)
PCP: Kathlene November, MD  Admit date: 08/29/2015 Discharge date: 08/31/2015  Reason for admission:  Atypical chest pain  Discharge disposition:  [place]  Recommendations for Outpatient Follow-up:  F/u with PCP in 3-7 days as needed F/u with cardiology in 1-2 weeks for stress est evaluation   Transition Care Management Follow-up Telephone Call  How have you been since you were released from the hospital?  Pt states he's feeling much better.  Denies chest pain, shortness of breath, increased heart rate, nausea, vomiting, and indigestion.     Do you understand why you were in the hospital? yes   Do you understand the discharge instructions? yes  Items Reviewed:  Medications reviewed: yes, pt has stopped Lexapro  Allergies reviewed: yes  Dietary changes reviewed: yes, low sodium, heart healthy diet  Referrals reviewed: yes, pt encouraged to call cardiology to schedule appointment.      Functional Questionnaire:   Activities of Daily Living (ADLs):   He states they are independent in the following: ambulation, bathing and hygiene, feeding, continence, grooming, toileting and dressing States they require assistance with the following: n/a   Any transportation issues/concerns?: no   Any patient concerns? no   Confirmed importance and date/time of follow-up visits scheduled: yes   Confirmed with patient if condition begins to worsen call PCP or go to the ER: yes

## 2015-09-02 NOTE — Telephone Encounter (Signed)
Yes if he can walk exercise myovue if not lexiscan

## 2015-09-02 NOTE — Telephone Encounter (Signed)
PT  HAD  EPISODE OF CHEST  PAIN Sunday THINKS  MAY HAVE  BEEN ANXIETY  ALSO  HAD  NAUSEA AND  VOMMITTING   WENT  TO ER   HAD NORMAL  TROPONIN AND  CXR  SIMILAR  TO OLD .  WANT  TO KNOW  IF NEEDS  STRESS TEST  PER  ER  MD RECOMMENDATIONS   HAD  CABG  APPROX  5 YEARS  AGO . WILL SEND TO DR Johnsie Cancel FOR  REVIEW .Adonis Housekeeper

## 2015-09-02 NOTE — Telephone Encounter (Signed)
Thx, JP

## 2015-09-03 NOTE — Telephone Encounter (Signed)
PT  AWARE./CY 

## 2015-09-03 NOTE — Telephone Encounter (Signed)
New Message  PT returning Christine's call. Pt stated it would be best to call at home before 11am. Please call back and discuss,.

## 2015-09-06 ENCOUNTER — Encounter: Payer: Self-pay | Admitting: Internal Medicine

## 2015-09-06 ENCOUNTER — Telehealth (HOSPITAL_COMMUNITY): Payer: Self-pay | Admitting: *Deleted

## 2015-09-06 ENCOUNTER — Ambulatory Visit (INDEPENDENT_AMBULATORY_CARE_PROVIDER_SITE_OTHER): Payer: Medicare Other | Admitting: Internal Medicine

## 2015-09-06 VITALS — BP 122/64 | HR 56 | Temp 97.7°F | Ht 74.0 in | Wt 156.0 lb

## 2015-09-06 DIAGNOSIS — F418 Other specified anxiety disorders: Secondary | ICD-10-CM | POA: Diagnosis not present

## 2015-09-06 DIAGNOSIS — F419 Anxiety disorder, unspecified: Secondary | ICD-10-CM

## 2015-09-06 DIAGNOSIS — I25709 Atherosclerosis of coronary artery bypass graft(s), unspecified, with unspecified angina pectoris: Secondary | ICD-10-CM

## 2015-09-06 DIAGNOSIS — Z09 Encounter for follow-up examination after completed treatment for conditions other than malignant neoplasm: Secondary | ICD-10-CM

## 2015-09-06 DIAGNOSIS — F329 Major depressive disorder, single episode, unspecified: Secondary | ICD-10-CM

## 2015-09-06 LAB — CBC WITH DIFFERENTIAL/PLATELET
BASOS PCT: 0.6 % (ref 0.0–3.0)
Basophils Absolute: 0.1 10*3/uL (ref 0.0–0.1)
EOS PCT: 5.8 % — AB (ref 0.0–5.0)
Eosinophils Absolute: 0.5 10*3/uL (ref 0.0–0.7)
HCT: 41.5 % (ref 39.0–52.0)
Hemoglobin: 13.5 g/dL (ref 13.0–17.0)
LYMPHS ABS: 1.8 10*3/uL (ref 0.7–4.0)
Lymphocytes Relative: 18.9 % (ref 12.0–46.0)
MCHC: 32.5 g/dL (ref 30.0–36.0)
MCV: 92.4 fl (ref 78.0–100.0)
MONO ABS: 0.8 10*3/uL (ref 0.1–1.0)
MONOS PCT: 8.3 % (ref 3.0–12.0)
NEUTROS PCT: 66.4 % (ref 43.0–77.0)
Neutro Abs: 6.2 10*3/uL (ref 1.4–7.7)
Platelets: 192 10*3/uL (ref 150.0–400.0)
RBC: 4.49 Mil/uL (ref 4.22–5.81)
RDW: 16.6 % — AB (ref 11.5–15.5)
WBC: 9.3 10*3/uL (ref 4.0–10.5)

## 2015-09-06 NOTE — Progress Notes (Signed)
Pre visit review using our clinic review tool, if applicable. No additional management support is needed unless otherwise documented below in the visit note. 

## 2015-09-06 NOTE — Telephone Encounter (Signed)
Disregard previous telephone note entered by me.  Left message on voicemail per DPR in reference to upcoming appointment scheduled on 11/08/15 at 1000 with detailed instructions given per Myocardial Perfusion Study Information Sheet for the test. LM to arrive 15 minutes early, and that it is imperative to arrive on time for appointment to keep from having the test rescheduled. If you need to cancel or reschedule your appointment, please call the office within 24 hours of your appointment. Failure to do so may result in a cancellation of your appointment, and a $50 no show fee. Phone number given for call back for any questions. Brihany Butch, Ranae [place]

## 2015-09-06 NOTE — Progress Notes (Signed)
Subjective:    Patient ID: [name], male    DOB: 1930-08-15, 81 y.o.   MRN: [id number]  DOS:  09/06/2015 Type of visit - description : Hospital follow-up Interval history:  Admitted 08-29-15 for 2 days: Had chest pain, atypical, GERD-related? ACS ruled out. Echocardiogram was good. Was recommended to follow-up with cardiology and PCP as an outpatient Labs: Last potassium 3.5, creatinine normal, cholesterol LDL 18. Hemoglobin 12.6, slightly below baseline. Chest x-ray consistent with COPD  Review of Systems  Was admitted to the hospital with anterior lower chest pain with some radiation to the back associated with nausea. No diaphoresis, no recent Motrin or similar medications intake, symptoms resolved. Stress test pending. At this point he is feeling well and back to baseline. Not taking Lexapro.  Past Medical History  Diagnosis Date  . Mixed hyperlipidemia   . UNSPECIFIED ANEMIA   . CAD     a. 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  [name] Atrial fibrillation ([place])   . PREMATURE VENTRICULAR CONTRACTIONS     a. After CABG - was on Coumadin/Multaq for a period of time. Coumadin discontinued 04/2010 after maintaining NSR.  [name] Esophageal reflux   . IBS   . HYPERPLASIA, PROSTATE NOS W/URINARY OBST/LUTS   . LUMBAR RADICULOPATHY, RIGHT   . OSTEOPOROSIS   . INGUINAL HERNIA   . Lichen planus [id number]    [name]  . Diverticulosis 2011    Diverticulitis 2004  . PONV (postoperative nausea and vomiting)   . Headache     saw neuro 4-16, MRI done    Past Surgical History  Procedure Laterality Date  . Knee surgery Right 1977    repair  . Hip fracture surgery Right 03/2007    Dr. Salvadore Farber  . Inguinal hernia repair Left 2012    Dr Brantley Stage  . Coronary artery bypass graft      2 VD;anomalous RCA;post op compliacted by AF  . Colonoscopy  2011    Diverticulosis; Dr Deatra Ina  . Prostate biopsy  09/19/2013   [facility]  . Cataract extraction Right 10/06/2013    Social History   Social History  . Marital Status: Married    Spouse Name: N/A  . Number of Children: 0  . Years of Education: N/A   Occupational History  . retired    Social History Main Topics  . Smoking status: Former Smoker    Types: Cigarettes    Quit date: 11/27/1962  . Smokeless tobacco: Never Used  . Alcohol Use: 0.0 oz/week    0 Standard drinks or equivalent per week     Comment:  no wine recently  . Drug Use: No  . Sexual Activity: Not Currently   Other Topics Concern  . Not on file   Social History Narrative   Lives at [facility] in independent living.     His wife lives in the skilled nursing area.     Has no children.  Family: nephews-nices in [place]   Retired from [employer].     Still drives.         Medication List       This list is accurate as of: 09/06/15  2:29 PM.  Always use your most recent med list.               aspirin 81 MG tablet  Take 81 mg by mouth daily.  cholecalciferol 1000 UNITS tablet  Commonly known as:  VITAMIN D  Take 1,000 Units by mouth every morning.     ELIQUIS 5 MG Tabs tablet  Generic drug:  apixaban  TAKE ONE (1) TABLET BY MOUTH TWO (2) TIMES DAILY     folic acid 1 MG tablet  Commonly known as:  FOLVITE  Take 1 tablet (1 mg total) by mouth daily.     loratadine 10 MG tablet  Commonly known as:  CLARITIN  Take 1 tablet (10 mg total) by mouth daily.     mometasone 50 MCG/ACT nasal spray  Commonly known as:  NASONEX  2 sprays each nares q day     MULTAQ 400 MG tablet  Generic drug:  dronedarone  TAKE ONE (1) TABLET BY MOUTH TWO (2) TIMES DAILY WITH A MEAL     multivitamin with minerals Tabs tablet  Take 1 tablet by mouth daily.     nitroGLYCERIN 0.4 MG SL tablet  Commonly known as:  NITROSTAT  Place 1 tablet (0.4 mg total) under the tongue every 5 (five) minutes as needed for chest pain.     pantoprazole 20 MG tablet  Commonly  known as:  PROTONIX  Take 1 tablet (20 mg total) by mouth daily.     rosuvastatin 10 MG tablet  Commonly known as:  CRESTOR  Take 1 tablet (10 mg total) by mouth daily.     TUMS PO  Take 1 tablet by mouth 2 (two) times daily as needed (heartburn). For calcium           Objective:   Physical Exam BP 122/64 mmHg  Pulse 56  Temp(Src) 97.7 F (36.5 C) (Oral)  Ht 6\' 2"  (1.88 m)  Wt 156 lb (70.761 kg)  BMI 20.02 kg/m2  SpO2 97% General:   Well developed, well nourished . NAD.  HEENT: rx Normocephalic . Face symmetric, atraumatic Lungs:  Slightly decreased breath sounds otherwise normal Normal respiratory effort, no intercostal retractions, no accessory muscle use. Heart: RRR,  no murmur.  no pretibial edema bilaterally  Abdomen:  Not distended, soft, non-tender. No rebound or rigidity. Palpable aorta, no TTP, no bruit Skin: Not pale. Not jaundice Neurologic:  alert & oriented X3.  Speech normal, gait appropriate for age and unassisted Psych--  Cognition and judgment appear intact.  Cooperative with normal attention span and concentration.  Behavior appropriate. No anxious or depressed appearing.    Assessment & Plan:   Assessment>> Hyperlipidemia Anxiety, depression: intol to zoloft, Rx lexapro 5 mg 07-2015: intolerant d/t nausea  CAD--CABG 2011 P- Atrial fibrillation, PVCs. --Multaq, Elliquis  Palpable Ao x years, [place] (-) for AAA 2005 COPD (per CXR 06-2015) DJD  GI: GERD, IBS, diverticulosis BPH Osteoporosis Lichen planus Headache -- saw neurology 02-2015, had a MRI/MRA (-)  Plan: CAD: recently admitted w/  atypical, admitted to the hospital, ruled out for ACS. He already has schedule a stress test as an outpatient. Check a CBC, noted a very mild drop in hemoglobin while @ the hospital Anxiety: Was prescribed Lexapro, poor tol due to nausea, at this point states that he is doing okay. We agreed to monitor the issue. Osteoporosis: Bone density test pending COPD:  PFTs pending RTC 10-2015 as schedule

## 2015-09-06 NOTE — Telephone Encounter (Signed)
Patient given detailed instructions per Myocardial Perfusion Study Information Sheet for test on 09/08/15 at 1000. Patient notified to arrive 15 minutes early and that it is imperative to arrive on time for appointment to keep from having the test rescheduled.  If you need to cancel or reschedule your appointment, please call the office within 24 hours of your appointment. Failure to do so may result in a cancellation of your appointment, and a $50 no show fee. Patient verbalized understanding. Zooey Schreurs W   

## 2015-09-06 NOTE — Assessment & Plan Note (Addendum)
CAD: recently admitted w/  atypical, admitted to the hospital, ruled out for ACS. He already has schedule a stress test as an outpatient. Check a CBC, noted a very mild drop in hemoglobin while @ the hospital Anxiety: Was prescribed Lexapro, poor tol due to nausea, at this point states that he is doing okay. We agreed to monitor the issue. Osteoporosis: Bone density test pending COPD: PFTs pending RTC 10-2015 as schedule

## 2015-09-06 NOTE — Patient Instructions (Signed)
Get your blood work before you leave    

## 2015-09-08 ENCOUNTER — Encounter: Payer: Self-pay | Admitting: Cardiovascular Disease

## 2015-09-08 ENCOUNTER — Ambulatory Visit (HOSPITAL_COMMUNITY): Payer: Medicare Other | Attending: Cardiovascular Disease

## 2015-09-08 DIAGNOSIS — R079 Chest pain, unspecified: Secondary | ICD-10-CM | POA: Diagnosis not present

## 2015-09-08 DIAGNOSIS — R9439 Abnormal result of other cardiovascular function study: Secondary | ICD-10-CM | POA: Insufficient documentation

## 2015-09-08 LAB — MYOCARDIAL PERFUSION IMAGING
CHL CUP NUCLEAR SDS: 0
CHL CUP RESTING HR STRESS: 65 {beats}/min
CSEPEDS: 0 s
CSEPEW: 7 METS
Exercise duration (min): 6 min
LV sys vol: 35 mL
LVDIAVOL: 93 mL
MPHR: 138 {beats}/min
Peak HR: 126 {beats}/min
Percent HR: 91 %
RATE: 0.28
SRS: 4
SSS: 4
TID: 0.92

## 2015-09-08 MED ORDER — TECHNETIUM TC 99M SESTAMIBI GENERIC - CARDIOLITE
10.1000 | Freq: Once | INTRAVENOUS | Status: AC | PRN
  Administered 2015-09-08: 10.1 via INTRAVENOUS

## 2015-09-08 MED ORDER — TECHNETIUM TC 99M SESTAMIBI GENERIC - CARDIOLITE
32.8000 | Freq: Once | INTRAVENOUS | Status: AC | PRN
  Administered 2015-09-08: 32.8 via INTRAVENOUS

## 2015-09-23 DIAGNOSIS — R3912 Poor urinary stream: Secondary | ICD-10-CM | POA: Diagnosis not present

## 2015-09-23 DIAGNOSIS — N138 Other obstructive and reflux uropathy: Secondary | ICD-10-CM | POA: Diagnosis not present

## 2015-09-23 DIAGNOSIS — N401 Enlarged prostate with lower urinary tract symptoms: Secondary | ICD-10-CM | POA: Diagnosis not present

## 2015-09-27 ENCOUNTER — Ambulatory Visit: Payer: Medicare Other | Admitting: Internal Medicine

## 2015-10-04 ENCOUNTER — Ambulatory Visit (INDEPENDENT_AMBULATORY_CARE_PROVIDER_SITE_OTHER)
Admit: 2015-10-04 | Discharge: 2015-10-04 | Disposition: A | Discharge status: Home / Self Care | Payer: Medicare Other | Attending: Internal Medicine | Admitting: Internal Medicine

## 2015-10-04 ENCOUNTER — Ambulatory Visit (INDEPENDENT_AMBULATORY_CARE_PROVIDER_SITE_OTHER): Payer: Medicare Other | Admitting: Internal Medicine

## 2015-10-04 DIAGNOSIS — J449 Chronic obstructive pulmonary disease, unspecified: Secondary | ICD-10-CM | POA: Diagnosis not present

## 2015-10-04 DIAGNOSIS — M810 Age-related osteoporosis without current pathological fracture: Secondary | ICD-10-CM

## 2015-10-04 LAB — PULMONARY FUNCTION TEST
DL/VA % pred: 89 %
DL/VA: 4.27 ml/min/mmHg/L
DLCO unc % pred: 60 %
DLCO unc: 22.95 ml/min/mmHg
FEF 25-75 PRE: 1.27 L/s
FEF 25-75 Post: 1.57 L/sec
FEF2575-%CHANGE-POST: 23 %
FEF2575-%Pred-Post: 71 %
FEF2575-%Pred-Pre: 57 %
FEV1-%Change-Post: 4 %
FEV1-%PRED-POST: 79 %
FEV1-%Pred-Pre: 75 %
FEV1-POST: 2.6 L
FEV1-Pre: 2.48 L
FEV1FVC-%CHANGE-POST: 3 %
FEV1FVC-%Pred-Pre: 94 %
FEV6-%CHANGE-POST: 2 %
FEV6-%PRED-PRE: 82 %
FEV6-%Pred-Post: 84 %
FEV6-POST: 3.62 L
FEV6-Pre: 3.53 L
FEV6FVC-%Change-Post: 1 %
FEV6FVC-%PRED-PRE: 101 %
FEV6FVC-%Pred-Post: 102 %
FVC-%CHANGE-POST: 1 %
FVC-%Pred-Post: 81 %
FVC-%Pred-Pre: 80 %
FVC-POST: 3.75 L
FVC-Pre: 3.71 L
POST FEV6/FVC RATIO: 97 %
PRE FEV1/FVC RATIO: 67 %
Post FEV1/FVC ratio: 69 %
Pre FEV6/FVC Ratio: 95 %
RV % PRED: 103 %
RV: 3.03 L
TLC % pred: 97 %
TLC: 7.68 L

## 2015-10-04 NOTE — Progress Notes (Signed)
PFT done today. 

## 2015-10-05 ENCOUNTER — Telehealth: Payer: Self-pay

## 2015-10-05 NOTE — Telephone Encounter (Signed)
Left message to schedule annual wellness visit

## 2015-10-14 ENCOUNTER — Telehealth: Payer: Self-pay | Admitting: Internal Medicine

## 2015-10-14 NOTE — Telephone Encounter (Signed)
Patient scheduled for 10/18/15 at 2:30pm.

## 2015-10-14 NOTE — Telephone Encounter (Signed)
Pt was instructed to call office and schedule an appointment with Dr. Larose Kells to discuss treatment options. Please schedule at Pt's convenience. Thank you.

## 2015-10-14 NOTE — Telephone Encounter (Signed)
Relation to [id number] Call back [phone]   Reason for call:  Patient states he reviewed his bone density results thru My Chart and was advised to receive treatment from PCP, patient would like to discuss results and next steps. Advised patient in the future he can send a message to PCP thru my chart he voice understanding,.

## 2015-10-18 ENCOUNTER — Ambulatory Visit (INDEPENDENT_AMBULATORY_CARE_PROVIDER_SITE_OTHER): Payer: Medicare Other | Admitting: Internal Medicine

## 2015-10-18 ENCOUNTER — Encounter: Payer: Self-pay | Admitting: Internal Medicine

## 2015-10-18 VITALS — BP 110/58 | HR 54 | Temp 98.0°F | Ht 74.0 in | Wt 158.0 lb

## 2015-10-18 DIAGNOSIS — Z09 Encounter for follow-up examination after completed treatment for conditions other than malignant neoplasm: Secondary | ICD-10-CM

## 2015-10-18 DIAGNOSIS — M810 Age-related osteoporosis without current pathological fracture: Secondary | ICD-10-CM

## 2015-10-18 MED ORDER — ALENDRONATE SODIUM 70 MG PO TABS: 70.0000 mg | ORAL_TABLET | ORAL | Status: DC

## 2015-10-18 NOTE — Progress Notes (Signed)
Subjective:    Patient ID: [name], male    DOB: [birth year], 81 y.o.   MRN: [id number]  DOS:  10/18/2015 Type of visit - description : To discuss osteoporosis Interval history: Recent bone density test showed osteoporosis, here to discuss the results. Currently taking calcium, vitamin D. Feeling well.    Review of Systems   Past Medical History  Diagnosis Date  . Mixed hyperlipidemia   . UNSPECIFIED ANEMIA   . CAD     a. 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  [name] Atrial fibrillation ([place])   . PREMATURE VENTRICULAR CONTRACTIONS     a. After CABG - was on Coumadin/Multaq for a period of time. Coumadin discontinued 04/2010 after maintaining NSR.  [name] Esophageal reflux   . IBS   . HYPERPLASIA, PROSTATE NOS W/URINARY OBST/LUTS   . LUMBAR RADICULOPATHY, RIGHT   . OSTEOPOROSIS   . INGUINAL HERNIA   . Lichen planus [id number]    [name]  . Diverticulosis 2011    Diverticulitis 2004  . PONV (postoperative nausea and vomiting)   . Headache     saw neuro 4-16, MRI done    Past Surgical History  Procedure Laterality Date  . Knee surgery Right 1977    repair  . Hip fracture surgery Right 03/2007    Dr. Salvadore Farber  . Inguinal hernia repair Left 2012    Dr Brantley Stage  . Coronary artery bypass graft      2 VD;anomalous RCA;post op compliacted by AF  . Colonoscopy  2011    Diverticulosis; Dr Deatra Ina  . Prostate biopsy  09/19/2013    [facility]  . Cataract extraction Right 10/06/2013    Social History   Social History  . Marital Status: Married    Spouse Name: N/A  . Number of Children: 0  . Years of Education: N/A   Occupational History  . retired    Social History Main Topics  . Smoking status: Former Smoker    Types: Cigarettes    Quit date: 11/27/1962  . Smokeless tobacco: Never Used  . Alcohol Use: 0.0 oz/week    0 Standard drinks or equivalent per week     Comment:  no wine  recently  . Drug Use: No  . Sexual Activity: Not Currently   Other Topics Concern  . Not on file   Social History Narrative   Lives at [facility] in independent living.     His wife lives in the skilled nursing area.     Has no children.  Family: nephews-nices in [place]   Retired from [employer].     Still drives.         Medication List       This list is accurate as of: 10/18/15  2:30 PM.  Always use your most recent med list.               aspirin 81 MG tablet  Take 81 mg by mouth daily.     cholecalciferol 1000 UNITS tablet  Commonly known as:  VITAMIN D  Take 1,000 Units by mouth every morning.     ELIQUIS 5 MG Tabs tablet  Generic drug:  apixaban  TAKE ONE (1) TABLET BY MOUTH TWO (2) TIMES DAILY     folic acid 1 MG tablet  Commonly known as:  FOLVITE  Take 1 tablet (1 mg total) by mouth  daily.     loratadine 10 MG tablet  Commonly known as:  CLARITIN  Take 1 tablet (10 mg total) by mouth daily.     mometasone 50 MCG/ACT nasal spray  Commonly known as:  NASONEX  2 sprays each nares q day     MULTAQ 400 MG tablet  Generic drug:  dronedarone  TAKE ONE (1) TABLET BY MOUTH TWO (2) TIMES DAILY WITH A MEAL     multivitamin with minerals Tabs tablet  Take 1 tablet by mouth daily.     nitroGLYCERIN 0.4 MG SL tablet  Commonly known as:  NITROSTAT  Place 1 tablet (0.4 mg total) under the tongue every 5 (five) minutes as needed for chest pain.     pantoprazole 20 MG tablet  Commonly known as:  PROTONIX  Take 1 tablet (20 mg total) by mouth daily.     rosuvastatin 10 MG tablet  Commonly known as:  CRESTOR  Take 1 tablet (10 mg total) by mouth daily.     TUMS PO  Take 1 tablet by mouth 2 (two) times daily as needed (heartburn). For calcium           Objective:   Physical Exam BP 110/58 mmHg  Pulse 54  Temp(Src) 98 F (36.7 C) (Oral)  Ht 6\' 2"  (1.88 m)  Wt 158 lb (71.668 kg)  BMI 20.28 kg/m2  SpO2 94% General:   Well developed, well  nourished . NAD.  Neurologic:  alert & oriented X3.  Speech normal, gait appropriate for age and unassisted Psych--  Cognition and judgment appear intact.  Cooperative with normal attention span and concentration.  Behavior appropriate. No anxious or depressed appearing.      Assessment & Plan:   Assessment>> Hyperlipidemia Anxiety, depression: intol to zoloft, Rx lexapro 5 mg 07-2015: intolerant d/t nausea  CAD--CABG 2011 P- Atrial fibrillation, PVCs. --Multaq, Elliquis  Palpable Ao x years, [place] (-) for AAA 2005 COPD (per CXR 06-2015) DJD  GI: GERD, IBS, diverticulosis BPH Osteoporosis: dexa ~2005 --> rx fosamax, took x 1 year, dexa ~2010 was rec no fosamax at that time. Dexa 09-2015: T score -2.4 --> rx fosamax Lichen planus Headache -- saw neurology 02-2015, had a MRI/MRA (-)  PLAN Osteoporosis: Recent bone density test showed a T score of -2.4, he has a history of vertebral and hip FX thus has osteoporosis. Continue calcium and vitamin D, in the past he took Fosamax for a year without apparent side effects, denies dysphagia. Different options discussed including PO,IV and SQ Rxs. Elected orals. We'll try Fosamax, precautions discussed, prescription sent. Will check a vitamin D and testosterone.  Today, I spent more than 15   min with the patient: >50% of the time counseling regards treatment options for osteoporosis

## 2015-10-18 NOTE — Progress Notes (Signed)
Pre visit review using our clinic review tool, if applicable. No additional management support is needed unless otherwise documented below in the visit note. 

## 2015-10-18 NOTE — Patient Instructions (Signed)
Get your blood work before you leave   Take Fosamax as prescribed  Come back in December as scheduled

## 2015-10-19 DIAGNOSIS — D1801 Hemangioma of skin and subcutaneous tissue: Secondary | ICD-10-CM | POA: Diagnosis not present

## 2015-10-19 DIAGNOSIS — L821 Other seborrheic keratosis: Secondary | ICD-10-CM | POA: Diagnosis not present

## 2015-10-19 DIAGNOSIS — L814 Other melanin hyperpigmentation: Secondary | ICD-10-CM | POA: Diagnosis not present

## 2015-10-19 DIAGNOSIS — Z85828 Personal history of other malignant neoplasm of skin: Secondary | ICD-10-CM | POA: Diagnosis not present

## 2015-10-19 DIAGNOSIS — L570 Actinic keratosis: Secondary | ICD-10-CM | POA: Diagnosis not present

## 2015-10-19 LAB — TESTOSTERONE, FREE, TOTAL, SHBG
Sex Hormone Binding: 66 nmol/L (ref 22–77)
TESTOSTERONE FREE: 39.4 pg/mL — AB (ref 47.0–244.0)
Testosterone-% Free: 1.2 % — ABNORMAL LOW (ref 1.6–2.9)
Testosterone: 321 ng/dL (ref 300–890)

## 2015-10-19 NOTE — Assessment & Plan Note (Signed)
Osteoporosis: Recent bone density test showed a T score of -2.4, he has a history of vertebral and hip FX thus has osteoporosis. Continue calcium and vitamin D, in the past he took Fosamax for a year without apparent side effects, denies dysphagia. Different options discussed including PO,IV and SQ Rxs. Elected orals. We'll try Fosamax, precautions discussed, prescription sent. Will check a vitamin D and testosterone.

## 2015-10-21 LAB — VITAMIN D 1,25 DIHYDROXY
VITAMIN D 1, 25 (OH) TOTAL: 33 pg/mL (ref 18–72)
VITAMIN D3 1, 25 (OH): 33 pg/mL

## 2015-10-28 ENCOUNTER — Encounter: Payer: Self-pay | Admitting: *Deleted

## 2015-10-28 ENCOUNTER — Ambulatory Visit: Payer: Medicare Other | Admitting: Cardiovascular Disease

## 2015-10-28 NOTE — Progress Notes (Signed)
Patient ID: [name], male   DOB: 1930/11/11, 81 y.o.   MRN: [id number] Mr. Reuter is an 81 y.o.  M with history of CAD s/p CABGx3 in [id number] complicated by post-op atrial fibrillation, hyperlipidemia, GERD He has history of AF after CABG treated with Multaq but had converted to NSR and Coumadin was discontinued in 04/2010. Hospitalized in January 2015 with nausea and GI illness Afib at time d/c with Eliquis , cardizem beta blocker and Multaq 4/15 ECG shows SB rate 45 Some fatigue Toprol stopped 4/15  HR improved  On Protonix and GI symptoms persist on probiotic now  Myovue 09/08/15 reviewed low risk   Nuclear stress EF: 62%.  The left ventricular ejection fraction is normal (55-65%).  1 mm of Horizontal ST segment depression was noted during stress in the II, III, aVF, V6, V5 and V4 leads.  Defect 1: There is a moderate sized defect of mild severity present in the basal inferoseptal, basal inferior, basal inferolateral, apical anterior and apical septal location. This is a fixed defect and in the setting of normal LVF this is most consistent with diaphragamatic attenuation artifact.  This is a low risk study.  Wife has Alzheimers and is in assisted living.   Wants folate refilled ? Prescribed by Dr Linna Darner On Fosamax for osteoporosis now  ROS: Denies fever, malais, weight loss, blurry vision, decreased visual acuity, cough, sputum, SOB, hemoptysis, pleuritic pain, palpitaitons, heartburn, abdominal pain, melena, lower extremity edema, claudication, or rash.  All other systems reviewed and negative  General: Affect appropriate Healthy:  appears stated age 28: normal Neck supple with no adenopathy JVP normal no bruits no thyromegaly Lungs clear with no wheezing and good diaphragmatic motion Heart:  S1/S2 no murmur, no rub, gallop or click PMI normal Abdomen: benighn, BS positve, no tenderness, no AAA no bruit.  No HSM or HJR Distal pulses intact with no bruits No edema Neuro  non-focal Skin warm and dry No muscular weakness   Current Outpatient Prescriptions  Medication Sig Dispense Refill  . alendronate (FOSAMAX) 70 MG tablet Take 1 tablet (70 mg total) by mouth every 7 (seven) days. Take with a full glass of water on an empty stomach. 4 tablet 11  . aspirin 81 MG tablet Take 81 mg by mouth daily.    . Calcium Carbonate Antacid (TUMS PO) Take 1 tablet by mouth 2 (two) times daily as needed (heartburn). For calcium    . cholecalciferol (VITAMIN D) 1000 UNITS tablet Take 1,000 Units by mouth every morning.      [name] ELIQUIS 5 MG TABS tablet TAKE ONE (1) TABLET BY MOUTH TWO (2) TIMES DAILY 60 tablet 10  . folic acid (FOLVITE) 1 MG tablet Take 1 tablet (1 mg total) by mouth daily. 30 tablet 2  . loratadine (CLARITIN) 10 MG tablet Take 1 tablet (10 mg total) by mouth daily. 30 tablet 11  . mometasone (NASONEX) 50 MCG/ACT nasal spray Place 2 sprays into the nose daily as needed (for congestion).    . MULTAQ 400 MG tablet TAKE ONE (1) TABLET BY MOUTH TWO (2) TIMES DAILY WITH A MEAL 60 tablet 10  . Multiple Vitamin (MULITIVITAMIN WITH MINERALS) TABS Take 1 tablet by mouth daily.      . nitroGLYCERIN (NITROSTAT) 0.4 MG SL tablet Place 0.4 mg under the tongue every 5 (five) minutes as needed for chest pain (3 doses max).    . pantoprazole (PROTONIX) 20 MG tablet Take 1 tablet (20 mg total) by mouth  daily. 21 tablet 0  . rosuvastatin (CRESTOR) 10 MG tablet Take 1 tablet (10 mg total) by mouth daily. 30 tablet 5   No current facility-administered medications for this visit.    Allergies  Amoxicillin  Electrocardiogram:  4/15  SB rate 45  ICRBBB  Toprol stopped  Assessment and Plan CAD/CABG:  2011   Stable no angina continue medical Rx PAF: Maint NSR no palpitaitons continue multaq and eliquis  GERD continue protonix improved Chol  Cholesterol is at goal.  Continue current dose of statin and diet Rx.  No myalgias or side effects.  F/U  LFT's in 6 months. Lab Results   Component Value Date   LDLCALC 18 08/31/2015            Anemia:  Assuming on folate for this check B12/Folate and Hct Osteoporosis:  Active on fosamax f/u primary   Jenkins Rouge

## 2015-11-01 ENCOUNTER — Ambulatory Visit (INDEPENDENT_AMBULATORY_CARE_PROVIDER_SITE_OTHER): Payer: Medicare Other | Admitting: Cardiovascular Disease

## 2015-11-01 ENCOUNTER — Encounter: Payer: Self-pay | Admitting: Cardiovascular Disease

## 2015-11-01 VITALS — BP 124/60 | HR 75 | Ht 74.0 in | Wt 160.8 lb

## 2015-11-01 DIAGNOSIS — E782 Mixed hyperlipidemia: Secondary | ICD-10-CM

## 2015-11-01 DIAGNOSIS — I2510 Atherosclerotic heart disease of native coronary artery without angina pectoris: Secondary | ICD-10-CM

## 2015-11-01 LAB — HEMOGLOBIN: Hemoglobin: 13.9 g/dL (ref 13.0–17.0)

## 2015-11-01 LAB — HEMATOCRIT: HCT: 40.7 % (ref 39.0–52.0)

## 2015-11-01 MED ORDER — FOLIC ACID 1 MG PO TABS
1.0000 mg | ORAL_TABLET | Freq: Every day | ORAL | Status: DC
Start: 2015-11-01 — End: 2016-01-28

## 2015-11-01 NOTE — Patient Instructions (Signed)
Medication Instructions:  Your physician recommends that you continue on your current medications as directed. Please refer to the Current Medication list given to you today.  Labwork: Your physician recommends that you have lab work today, B12, Folate acid, and    Testing/Procedures: NONE  Follow-Up: Your physician wants you to follow-up in: 12 month with Dr. Johnsie Cancel. You will receive a reminder letter in the mail two months in advance. If you don't receive a letter, please call our office to schedule the follow-up appointment.   Any Other Special Instructions Will Be Listed Below (If Applicable).     If you need a refill on your cardiac medications before your next appointment, please call your pharmacy.

## 2015-11-02 ENCOUNTER — Telehealth: Payer: Self-pay | Admitting: Cardiovascular Disease

## 2015-11-02 LAB — FOLATE

## 2015-11-02 LAB — VITAMIN B12

## 2015-11-02 NOTE — Telephone Encounter (Signed)
F/u    Pt returning phone call for lab results, Please call back. Pt will be at home until 10 am.

## 2015-11-02 NOTE — Telephone Encounter (Signed)
PT  AWARE OF   HCT  HGB  RESULTS .[name]

## 2015-11-03 ENCOUNTER — Other Ambulatory Visit (INDEPENDENT_AMBULATORY_CARE_PROVIDER_SITE_OTHER): Payer: Medicare Other | Admitting: *Deleted

## 2015-11-03 DIAGNOSIS — Z79899 Other long term (current) drug therapy: Secondary | ICD-10-CM | POA: Diagnosis not present

## 2015-11-03 DIAGNOSIS — D649 Anemia, unspecified: Secondary | ICD-10-CM

## 2015-11-03 LAB — FOLATE

## 2015-11-03 LAB — VITAMIN B12: VITAMIN B 12: 533 pg/mL (ref 211–911)

## 2015-11-03 NOTE — Addendum Note (Signed)
Addended by: Eulis Foster on: 11/03/2015 01:33 PM   Modules accepted: Orders

## 2015-11-16 ENCOUNTER — Ambulatory Visit: Payer: Medicare Other

## 2015-11-19 ENCOUNTER — Encounter: Payer: Self-pay | Admitting: Behavioral Health

## 2015-11-19 ENCOUNTER — Telehealth: Payer: Self-pay | Admitting: Behavioral Health

## 2015-11-19 NOTE — Telephone Encounter (Signed)
Pre-Visit Call completed with patient and chart updated.   Pre-Visit Info documented in Specialty Comments under [employer].    

## 2015-11-23 ENCOUNTER — Encounter: Payer: Medicare Other | Admitting: Internal Medicine

## 2015-12-28 ENCOUNTER — Other Ambulatory Visit: Payer: Self-pay | Admitting: Cardiovascular Disease

## 2016-01-14 ENCOUNTER — Encounter: Payer: Self-pay | Admitting: Internal Medicine

## 2016-01-14 ENCOUNTER — Ambulatory Visit (INDEPENDENT_AMBULATORY_CARE_PROVIDER_SITE_OTHER): Payer: Medicare Other | Admitting: Internal Medicine

## 2016-01-14 ENCOUNTER — Ambulatory Visit (HOSPITAL_BASED_OUTPATIENT_CLINIC_OR_DEPARTMENT_OTHER)
Admission: RE | Admit: 2016-01-14 | Discharge: 2016-01-14 | Disposition: A | Discharge status: Home / Self Care | Payer: Medicare Other | Source: Ambulatory Visit | Attending: Internal Medicine | Admitting: Internal Medicine

## 2016-01-14 ENCOUNTER — Telehealth: Payer: Self-pay | Admitting: Internal Medicine

## 2016-01-14 VITALS — BP 118/60 | HR 65 | Temp 98.2°F | Ht 74.0 in | Wt 163.2 lb

## 2016-01-14 DIAGNOSIS — M5136 Other intervertebral disc degeneration, lumbar region: Secondary | ICD-10-CM | POA: Insufficient documentation

## 2016-01-14 DIAGNOSIS — R29890 Loss of height: Secondary | ICD-10-CM | POA: Diagnosis not present

## 2016-01-14 DIAGNOSIS — M545 Low back pain: Secondary | ICD-10-CM | POA: Diagnosis not present

## 2016-01-14 DIAGNOSIS — I25810 Atherosclerosis of coronary artery bypass graft(s) without angina pectoris: Secondary | ICD-10-CM | POA: Diagnosis not present

## 2016-01-14 DIAGNOSIS — Z09 Encounter for follow-up examination after completed treatment for conditions other than malignant neoplasm: Secondary | ICD-10-CM

## 2016-01-14 DIAGNOSIS — M8580 Other specified disorders of bone density and structure, unspecified site: Secondary | ICD-10-CM | POA: Diagnosis not present

## 2016-01-14 DIAGNOSIS — I25709 Atherosclerosis of coronary artery bypass graft(s), unspecified, with unspecified angina pectoris: Secondary | ICD-10-CM

## 2016-01-14 DIAGNOSIS — M2578 Osteophyte, vertebrae: Secondary | ICD-10-CM | POA: Diagnosis not present

## 2016-01-14 LAB — BASIC METABOLIC PANEL
BUN: 19 mg/dL (ref 6–23)
CALCIUM: 10 mg/dL (ref 8.4–10.5)
CO2: 33 mEq/L — ABNORMAL HIGH (ref 19–32)
Chloride: 100 mEq/L (ref 96–112)
Creatinine, Ser: 0.87 mg/dL (ref 0.40–1.50)
GFR: 89.01 mL/min (ref >60.00)
Glucose, Bld: 83 mg/dL (ref 70–99)
Potassium: 4.7 mEq/L (ref 3.5–5.1)
SODIUM: 138 meq/L (ref 135–145)

## 2016-01-14 LAB — URINALYSIS, ROUTINE W REFLEX MICROSCOPIC
BILIRUBIN URINE: NEGATIVE
KETONES UR: NEGATIVE
LEUKOCYTES UA: NEGATIVE
NITRITE: NEGATIVE
Specific Gravity, Urine: 1.015 (ref 1.000–1.030)
Total Protein, Urine: NEGATIVE
URINE GLUCOSE: NEGATIVE
UROBILINOGEN UA: 0.2 (ref 0.0–1.0)
pH: 6 (ref 5.0–8.0)

## 2016-01-14 MED ORDER — TRAMADOL HCL 50 MG PO TABS: 50.0000 mg | ORAL_TABLET | Freq: Three times a day (TID) | ORAL | Status: DC | PRN

## 2016-01-14 NOTE — Progress Notes (Signed)
Subjective:    Patient ID: [name], male    DOB: [birth year], 82 y.o.   MRN: [id number]  DOS:  01/14/2016 Type of visit - description :  Acute visit  Interval history:  symptoms started 3 days ago with pain at the right mid back, some radiation to the buttock and  posterior thigh depending on position and movement. Denies any recent falls or injury. Pain is the least when he's sitting down straight, not decreased by lying down, he does have some nocturnal pain. Taking Tylenol with some release. Overall pain is not as severe as last week    Review of Systems No  fever chills No abdominal pain No weakness or dizziness No dysuria or gross hematuria No lower extremity paresthesias  Past Medical History  Diagnosis Date  . Mixed hyperlipidemia   . UNSPECIFIED ANEMIA   . CAD     a. 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  [name] Atrial fibrillation ([place])   . PREMATURE VENTRICULAR CONTRACTIONS     a. After CABG - was on Coumadin/Multaq for a period of time. Coumadin discontinued 04/2010 after maintaining NSR.  [name] Esophageal reflux   . IBS   . HYPERPLASIA, PROSTATE NOS W/URINARY OBST/LUTS   . LUMBAR RADICULOPATHY, RIGHT   . OSTEOPOROSIS   . INGUINAL HERNIA   . Lichen planus [id number]    [name]  . Diverticulosis 2011    Diverticulitis 2004  . PONV (postoperative nausea and vomiting)   . Headache     saw neuro 4-16, MRI done    Past Surgical History  Procedure Laterality Date  . Knee surgery Right 1977    repair  . Hip fracture surgery Right 03/2007    Dr. Salvadore Farber  . Inguinal hernia repair Left 2012    Dr Brantley Stage  . Coronary artery bypass graft      2 VD;anomalous RCA;post op compliacted by AF  . Colonoscopy  2011    Diverticulosis; Dr Deatra Ina  . Prostate biopsy  09/19/2013    [facility]  . Cataract extraction Right 10/06/2013    Social History   Social History  . Marital Status:  Married    Spouse Name: N/A  . Number of Children: 0  . Years of Education: N/A   Occupational History  . retired    Social History Main Topics  . Smoking status: Former Smoker    Types: Cigarettes    Quit date: 11/27/1962  . Smokeless tobacco: Never Used  . Alcohol Use: 0.0 oz/week    0 Standard drinks or equivalent per week     Comment:  no wine recently  . Drug Use: No  . Sexual Activity: Not Currently   Other Topics Concern  . Not on file   Social History Narrative   Lives at [facility] in independent living.     His wife lives in the skilled nursing area.     Has no children.  Family: nephews-nices in [place]   Retired from [employer].     Still drives.         Medication List       This list is accurate as of: 01/14/16 12:13 PM.  Always use your most recent med list.               alendronate 70 MG tablet  Commonly known as:  FOSAMAX  Take 1 tablet (70  mg total) by mouth every 7 (seven) days. Take with a full glass of water on an empty stomach.     aspirin 81 MG tablet  Take 81 mg by mouth daily.     cholecalciferol 1000 units tablet  Commonly known as:  VITAMIN D  Take 1,000 Units by mouth every morning.     ELIQUIS 5 MG Tabs tablet  Generic drug:  apixaban  TAKE ONE TABLET TWICE DAILY     folic acid 1 MG tablet  Commonly known as:  FOLVITE  Take 1 tablet (1 mg total) by mouth daily.     loratadine 10 MG tablet  Commonly known as:  CLARITIN  Take 1 tablet (10 mg total) by mouth daily.     mometasone 50 MCG/ACT nasal spray  Commonly known as:  NASONEX  Place 2 sprays into the nose daily as needed (for congestion). Reported on 11/19/2015     MULTAQ 400 MG tablet  Generic drug:  dronedarone  TAKE ONE (1) TABLET BY MOUTH TWO (2) TIMES DAILY WITH A MEAL     multivitamin with minerals Tabs tablet  Take 1 tablet by mouth daily.     nitroGLYCERIN 0.4 MG SL tablet  Commonly known as:  NITROSTAT  Place 0.4 mg under the tongue every 5  (five) minutes as needed for chest pain (3 doses max). Reported on 01/14/2016     pantoprazole 20 MG tablet  Commonly known as:  PROTONIX  Take 1 tablet (20 mg total) by mouth daily.     rosuvastatin 10 MG tablet  Commonly known as:  CRESTOR  Take 1 tablet (10 mg total) by mouth daily.     traMADol 50 MG tablet  Commonly known as:  ULTRAM  Take 1 tablet (50 mg total) by mouth every 8 (eight) hours as needed.     TUMS PO  Take 1 tablet by mouth 2 (two) times daily as needed (heartburn). For calcium           Objective:   Physical Exam BP 118/60 mmHg  Pulse 65  Temp(Src) 98.2 F (36.8 C) (Oral)  Ht 6\' 2"  (1.88 m)  Wt 163 lb 4 oz (74.05 kg)  BMI 20.95 kg/m2  SpO2 97% General:   Well developed, well nourished . NAD.  HEENT:  Normocephalic . Face symmetric, atraumatic Abdomen:  Not distended, soft, non-tender. No rebound or rigidity.  no CVA tenderness per se but is sore to touch right from the T10-T12  Skin: Not pale. Not jaundice Neurologic:  alert & oriented X3.  Speech normal, gait antalgic, assisted by a walker. Posture is also antalgic, he was unable to lay down in the examining table.  DTRs slightly decreased throughout but symmetric. Motor symmetric. Psych--  Cognition and judgment appear intact.  Cooperative with normal attention span and concentration.  Behavior appropriate. No anxious or depressed appearing.    Assessment & Plan:   Assessment>> Hyperlipidemia Anxiety, depression: intol to zoloft, Rx lexapro 5 mg 07-2015: intolerant d/t nausea  CAD--CABG 2011 P- Atrial fibrillation, PVCs. --Multaq, Elliquis  Palpable Ao x years, [place] (-) for AAA 2005 COPD (per CXR 06-2015), PFTs 10-04-15 mild obstruction DJD  Gait-- uses a walker prn GI: GERD, IBS, diverticulosis BPH Osteoporosis: dexa ~2005 --> rx fosamax, took x 1 year, dexa ~2010 was rec no fosamax at that time. Dexa 09-2015: T score -2.4 --> rx fosamax Lichen planus Headache -- saw neurology 02-2015,  had a MRI/MRA (-)  PLAN Acute back pain: Acute  back pain with some radicular features but neurological exam is symmetric. The pain is already slightly better. He is anticoagulated but denies abdominal pain. Plan- x-ray, UA, continue Tylenol,  Add tramadol, watch for drowsiness , call if not improving soon, call anytime if fever, chills or abdominal pain CAD: during last  admission to the hospital sodium was noted to be low, recheck a BMP RTC April as scheduled

## 2016-01-14 NOTE — Telephone Encounter (Signed)
Relation to [id number] Call back number:[phone]  Reason for call:  Patient 02/29/2016 physical appointment has to be [facility] due to provider not being in the office as per today AVS come back in April may I use 03/24/2016 at 10:30am. Please advise

## 2016-01-14 NOTE — Progress Notes (Signed)
Pre visit review using our clinic review tool, if applicable. No additional management support is needed unless otherwise documented below in the visit note. 

## 2016-01-14 NOTE — Assessment & Plan Note (Signed)
Acute back pain: Acute back pain with some radicular features but neurological exam is symmetric. The pain is already slightly better. He is anticoagulated but denies abdominal pain. Plan- x-ray, UA, continue Tylenol,  Add tramadol, watch for drowsiness , call if not improving soon, call anytime if fever, chills or abdominal pain CAD: during last  admission to the hospital sodium was noted to be low, recheck a BMP RTC April as scheduled

## 2016-01-14 NOTE — Telephone Encounter (Signed)
Okay 

## 2016-01-14 NOTE — Patient Instructions (Addendum)
GO TO THE LAB : Get the blood work and a urine sample    STOP BY THE FIRST FLOOR:  get the XR    For pain: Tylenol 650 mg one tablet 4 times a day You can also take tramadol  One tablet 3 times a day, watch for feeling drowsy and falls  If not gradually improving in the next few days: call the office  Call anytime if you have stomach pain, fever or chills  We'll see you in April as scheduled

## 2016-01-26 ENCOUNTER — Other Ambulatory Visit: Payer: Self-pay | Admitting: Cardiovascular Disease

## 2016-01-28 ENCOUNTER — Other Ambulatory Visit: Payer: Self-pay | Admitting: *Deleted

## 2016-01-28 MED ORDER — DRONEDARONE HCL 400 MG PO TABS: ORAL_TABLET | ORAL | Status: DC

## 2016-01-28 MED ORDER — FOLIC ACID 1 MG PO TABS: 1.0000 mg | ORAL_TABLET | Freq: Every day | ORAL | Status: DC

## 2016-02-29 ENCOUNTER — Encounter: Payer: Medicare Other | Admitting: Internal Medicine

## 2016-03-22 ENCOUNTER — Other Ambulatory Visit: Payer: Self-pay

## 2016-03-22 MED ORDER — ROSUVASTATIN CALCIUM 10 MG PO TABS: 10.0000 mg | ORAL_TABLET | Freq: Every day | ORAL | Status: DC

## 2016-03-23 ENCOUNTER — Telehealth: Payer: Self-pay | Admitting: *Deleted

## 2016-03-23 NOTE — Telephone Encounter (Signed)
Unable to reach patient at time of pre-visit call. Left message for patient to return call when available.  

## 2016-03-24 ENCOUNTER — Ambulatory Visit (INDEPENDENT_AMBULATORY_CARE_PROVIDER_SITE_OTHER): Payer: Medicare Other | Admitting: Internal Medicine

## 2016-03-24 ENCOUNTER — Other Ambulatory Visit: Payer: Self-pay

## 2016-03-24 ENCOUNTER — Encounter: Payer: Self-pay | Admitting: Internal Medicine

## 2016-03-24 VITALS — BP 100/70 | HR 75 | Temp 98.0°F | Ht 74.0 in | Wt 158.4 lb

## 2016-03-24 DIAGNOSIS — R5383 Other fatigue: Secondary | ICD-10-CM | POA: Diagnosis not present

## 2016-03-24 DIAGNOSIS — E782 Mixed hyperlipidemia: Secondary | ICD-10-CM | POA: Diagnosis not present

## 2016-03-24 DIAGNOSIS — M810 Age-related osteoporosis without current pathological fracture: Secondary | ICD-10-CM

## 2016-03-24 DIAGNOSIS — Z09 Encounter for follow-up examination after completed treatment for conditions other than malignant neoplasm: Secondary | ICD-10-CM

## 2016-03-24 DIAGNOSIS — Z0001 Encounter for general adult medical examination with abnormal findings: Secondary | ICD-10-CM | POA: Diagnosis not present

## 2016-03-24 DIAGNOSIS — R7303 Prediabetes: Secondary | ICD-10-CM

## 2016-03-24 DIAGNOSIS — J42 Unspecified chronic bronchitis: Secondary | ICD-10-CM

## 2016-03-24 DIAGNOSIS — I25119 Atherosclerotic heart disease of native coronary artery with unspecified angina pectoris: Secondary | ICD-10-CM

## 2016-03-24 DIAGNOSIS — Z0000 Encounter for general adult medical examination without abnormal findings: Secondary | ICD-10-CM

## 2016-03-24 LAB — CBC WITH DIFFERENTIAL/PLATELET
Basophils Absolute: 0.1 10*3/uL (ref 0.0–0.1)
Basophils Relative: 1 % (ref 0.0–3.0)
EOS PCT: 4.5 % (ref 0.0–5.0)
Eosinophils Absolute: 0.4 10*3/uL (ref 0.0–0.7)
HEMATOCRIT: 42.7 % (ref 39.0–52.0)
Hemoglobin: 14.2 g/dL (ref 13.0–17.0)
LYMPHS ABS: 1.9 10*3/uL (ref 0.7–4.0)
LYMPHS PCT: 19.7 % (ref 12.0–46.0)
MCHC: 33.3 g/dL (ref 30.0–36.0)
MCV: 91.1 fl (ref 78.0–100.0)
MONOS PCT: 8.3 % (ref 3.0–12.0)
Monocytes Absolute: 0.8 10*3/uL (ref 0.1–1.0)
NEUTROS ABS: 6.4 10*3/uL (ref 1.4–7.7)
NEUTROS PCT: 66.5 % (ref 43.0–77.0)
Platelets: 202 10*3/uL (ref 150.0–400.0)
RBC: 4.68 Mil/uL (ref 4.22–5.81)
RDW: 16.7 % — ABNORMAL HIGH (ref 11.5–15.5)
WBC: 9.6 10*3/uL (ref 4.0–10.5)

## 2016-03-24 LAB — ALT: ALT: 15 U/L (ref 0–53)

## 2016-03-24 LAB — AST: AST: 22 U/L (ref 0–37)

## 2016-03-24 LAB — HEMOGLOBIN A1C: Hgb A1c MFr Bld: 6.1 % (ref 4.6–6.5)

## 2016-03-24 NOTE — Assessment & Plan Note (Addendum)
Flu vaccine-- 08/28/15; Tdap-- 05/16/11; PNA-- 05/17/15; prevnar 2016; zostavax : declined , explained benefits CCS: 01/03/11 w/ Dr. Herbie Baltimore D. Deatra Ina ; severe diverticulosis found in the sigmoid colon;  No polyps, I rec no further scopes Elevated PSA, saw urology recently was told to return to the office as needed. No further PSAs.   Discuss healthcare power of attorney, MOST form provided Fall prevention, diet and exercise also  discussed

## 2016-03-24 NOTE — Patient Instructions (Addendum)
Get your blood work before you leave    The days you don't feel great, please check your blood pressure, let me know if it is less than 110/65  Please consider visit these websites for more information regarding side healthcare power of attorney: --www.begintheconversation.org --theconversationproject.org  Visit in 6 months    Fall Prevention and Home Safety Falls cause injuries and can affect all age groups. It is possible to use preventive measures to significantly decrease the likelihood of falls. There are many simple measures which can make your home safer and prevent falls. OUTDOORS  Repair cracks and edges of walkways and driveways.  Remove high doorway thresholds.  Trim shrubbery on the main path into your home.  Have good outside lighting.  Clear walkways of tools, rocks, debris, and clutter.  Check that handrails are not broken and are securely fastened. Both sides of steps should have handrails.  Have leaves, snow, and ice cleared regularly.  Use sand or salt on walkways during winter months.  In the garage, clean up grease or oil spills. BATHROOM  Install night lights.  Install grab bars by the toilet and in the tub and shower.  Use non-skid mats or decals in the tub or shower.  Place a plastic non-slip stool in the shower to sit on, if needed.  Keep floors dry and clean up all water on the floor immediately.  Remove soap buildup in the tub or shower on a regular basis.  Secure bath mats with non-slip, double-sided rug tape.  Remove throw rugs and tripping hazards from the floors. BEDROOMS  Install night lights.  Make sure a bedside light is easy to reach.  Do not use oversized bedding.  Keep a telephone by your bedside.  Have a firm chair with side arms to use for getting dressed.  Remove throw rugs and tripping hazards from the floor. KITCHEN  Keep handles on pots and pans turned toward the center of the stove. Use back burners when  possible.  Clean up spills quickly and allow time for drying.  Avoid walking on wet floors.  Avoid hot utensils and knives.  Position shelves so they are not too high or low.  Place commonly used objects within easy reach.  If necessary, use a sturdy step stool with a grab bar when reaching.  Keep electrical cables out of the way.  Do not use floor polish or wax that makes floors slippery. If you must use wax, use non-skid floor wax.  Remove throw rugs and tripping hazards from the floor. STAIRWAYS  Never leave objects on stairs.  Place handrails on both sides of stairways and use them. Fix any loose handrails. Make sure handrails on both sides of the stairways are as long as the stairs.  Check carpeting to make sure it is firmly attached along stairs. Make repairs to worn or loose carpet promptly.  Avoid placing throw rugs at the top or bottom of stairways, or properly secure the rug with carpet tape to prevent slippage. Get rid of throw rugs, if possible.  Have an electrician put in a light switch at the top and bottom of the stairs. OTHER FALL PREVENTION TIPS  Wear low-heel or rubber-soled shoes that are supportive and fit well. Wear closed toe shoes.  When using a stepladder, make sure it is fully opened and both spreaders are firmly locked. Do not climb a closed stepladder.  Add color or contrast paint or tape to grab bars and handrails in your home. Place  contrasting color strips on first and last steps.  Learn and use mobility aids as needed. Install an electrical emergency response system.  Turn on lights to avoid dark areas. Replace light bulbs that burn out immediately. Get light switches that glow.  Arrange furniture to create clear pathways. Keep furniture in the same place.  Firmly attach carpet with non-skid or double-sided tape.  Eliminate uneven floor surfaces.  Select a carpet pattern that does not visually hide the edge of steps.  Be aware of all  pets. OTHER HOME SAFETY TIPS  Set the water temperature for 120 F (48.8 C).  Keep emergency numbers on or near the telephone.  Keep smoke detectors on every level of the home and near sleeping areas. Document Released: 11/03/2002 Document Revised: 05/14/2012 Document Reviewed: 02/02/2012 [facility] Patient Information 2015 [place], [place]. This information is not intended to replace advice given to you by your health care provider. Make sure you discuss any questions you have with your health care provider.   Preventive Care for Adults Ages 70 and over  Blood pressure check.** / Every 1 to 2 years.  Lipid and cholesterol check.**/ Every 5 years beginning at age 74.  Lung cancer screening. / Every year if you are aged 29-80 years and have a 30-pack-year history of smoking and currently smoke or have quit within the past 15 years. Yearly screening is stopped once you have quit smoking for at least 15 years or develop a health problem that would prevent you from having lung cancer treatment.  Fecal occult blood test (FOBT) of stool. / Every year beginning at age 64 and continuing until age 41. You may not have to do this test if you get a colonoscopy every 10 years.  Flexible sigmoidoscopy** or colonoscopy.** / Every 5 years for a flexible sigmoidoscopy or every 10 years for a colonoscopy beginning at age 54 and continuing until age 96.  Hepatitis C blood test.** / For all people born from [birth year] through 1963 and any individual with known risks for hepatitis C.  Abdominal aortic aneurysm (AAA) screening.** / A one-time screening for ages 46 to 56 years who are current or former smokers.  Skin self-exam. / Monthly.  Influenza vaccine. / Every year.  Tetanus, diphtheria, and acellular pertussis (Tdap/Td) vaccine.** / 1 dose of Td every 10 years.  Varicella vaccine.** / Consult your health care provider.  Zoster vaccine.** / 1 dose for adults aged 27 years or older.  Pneumococcal  13-valent conjugate (PCV13) vaccine.** / Consult your health care provider.  Pneumococcal polysaccharide (PPSV23) vaccine.** / 1 dose for all adults aged 78 years and older.  Meningococcal vaccine.** / Consult your health care provider.  Hepatitis A vaccine.** / Consult your health care provider.  Hepatitis B vaccine.** / Consult your health care provider.  Haemophilus influenzae type b (Hib) vaccine.** / Consult your health care provider. **Family history and personal history of risk and conditions may change your health care provider's recommendations. Document Released: 01/09/2002 Document Revised: 11/18/2013 Document Reviewed: 04/10/2011 [facility] Patient Information 2015 [place], [place]. This information is not intended to replace advice given to you by your health care provider. Make sure you discuss any questions you have with your health care provider.

## 2016-03-24 NOTE — Progress Notes (Signed)
Subjective:    Patient ID: [name], male    DOB: [birth year], 82 y.o.   MRN: [id number]  DOS:  03/24/2016 Type of visit - description : yearly Interval history:  Reports he is not allergic to Amoxicillin, has taken it many times, in one opportunity had N-V x 1  Back pain: Was intolerant to tramadol, took Tylenol for a while, back pain is almost gone at this point. High cholesterol: Good compliance with Crestor On  Fosamax, no apparent side effects. There are days that he feels very energetic and days he feels tired. When he is tired, he denies any chest pain, palpitation, shortness of breath or lower extremity edema. today his BP is slightly low and he does feel tired.    Review of Systems  Constitutional: No fever. No chills. No unexplained wt changes. No unusual sweats  HEENT: No dental problems, no ear discharge, no facial swelling, no voice changes. No eye discharge, no eye  redness , no  intolerance to light   Respiratory: No wheezing , no  difficulty breathing. No cough , no mucus production  Cardiovascular: No CP, no leg swelling , no  Palpitations  GI: no nausea, no vomiting, no diarrhea , no  abdominal pain.  No blood in the stools. No dysphagia, no odynophagia    Endocrine: No polyphagia, no polyuria , no polydipsia  GU: No dysuria, gross hematuria, difficulty urinating. Some nocturia, at baseline..  Musculoskeletal: No joint swellings or unusual aches or pains  Skin: No change in the color of the skin, palor , no  Rash  Allergic, immunologic: No environmental allergies , no  food allergies  Neurological: No dizziness no  syncope. No headaches. No diplopia, no slurred, no slurred speech, no motor deficits, no facial  Numbness  Hematological: No enlarged lymph nodes, no easy bruising , no unusual bleedings  Psychiatry: No suicidal ideas, no hallucinations, no beavior problems, no confusion.  No unusual/severe anxiety, no depression . Has good days and bad  days, wife with dementia  Past Medical History  Diagnosis Date  . Mixed hyperlipidemia   . UNSPECIFIED ANEMIA   . CAD     a. 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  [name] Atrial fibrillation ([place])   . PREMATURE VENTRICULAR CONTRACTIONS     a. After CABG - was on Coumadin/Multaq for a period of time. Coumadin discontinued 04/2010 after maintaining NSR.  [name] Esophageal reflux   . IBS   . HYPERPLASIA, PROSTATE NOS W/URINARY OBST/LUTS   . LUMBAR RADICULOPATHY, RIGHT   . OSTEOPOROSIS   . INGUINAL HERNIA   . Lichen planus [id number]    [name]  . Diverticulosis 2011    Diverticulitis 2004  . PONV (postoperative nausea and vomiting)   . Headache     saw neuro 4-16, MRI done    Past Surgical History  Procedure Laterality Date  . Knee surgery Right 1977    repair  . Hip fracture surgery Right 03/2007    Dr. Salvadore Farber  . Inguinal hernia repair Left 2012    Dr Brantley Stage  . Coronary artery bypass graft      2 VD;anomalous RCA;post op compliacted by AF  . Colonoscopy  2011    Diverticulosis; Dr Deatra Ina  . Prostate biopsy  09/19/2013    [facility]  . Cataract extraction Right 10/06/2013    Social History   Social History  .  Marital Status: Married    Spouse Name: N/A  . Number of Children: 0  . Years of Education: N/A   Occupational History  . retired    Social History Main Topics  . Smoking status: Former Smoker    Types: Cigarettes    Quit date: 11/27/1962  . Smokeless tobacco: Never Used  . Alcohol Use: 0.0 oz/week    0 Standard drinks or equivalent per week     Comment:  no wine recently  . Drug Use: No  . Sexual Activity: Not Currently   Other Topics Concern  . Not on file   Social History Narrative   Lives at [facility] in independent living.     His wife lives in the skilled nursing area.     Has no children.  Family: nephews-nices in [place]   Retired from [employer].      Still drives.      Family History  Problem Relation Age of Onset  . Breast cancer Sister     Deceased  . Stroke Mother   . Pancreatic cancer Father     Deceased, 26  . Heart disease Sister 76  . Colon cancer Neg Hx   . Prostate cancer Neg Hx       Medication List       This list is accurate as of: 03/24/16 11:59 PM.  Always use your most recent med list.               alendronate 70 MG tablet  Commonly known as:  FOSAMAX  Take 1 tablet (70 mg total) by mouth every 7 (seven) days. Take with a full glass of water on an empty stomach.     aspirin 81 MG tablet  Take 81 mg by mouth daily.     cholecalciferol 1000 units tablet  Commonly known as:  VITAMIN D  Take 1,000 Units by mouth every morning.     dronedarone 400 MG tablet  Commonly known as:  MULTAQ  TAKE ONE (1) TABLET BY MOUTH TWO (2) TIMES DAILY WITH A MEAL     ELIQUIS 5 MG Tabs tablet  Generic drug:  apixaban  TAKE ONE TABLET TWICE DAILY     folic acid 1 MG tablet  Commonly known as:  FOLVITE  Take 1 tablet (1 mg total) by mouth daily.     loratadine 10 MG tablet  Commonly known as:  CLARITIN  Take 1 tablet (10 mg total) by mouth daily.     mometasone 50 MCG/ACT nasal spray  Commonly known as:  NASONEX  Place 2 sprays into the nose daily as needed (for congestion). Reported on 11/19/2015     multivitamin with minerals Tabs tablet  Take 1 tablet by mouth daily.     nitroGLYCERIN 0.4 MG SL tablet  Commonly known as:  NITROSTAT  Place 0.4 mg under the tongue every 5 (five) minutes as needed for chest pain (3 doses max). Reported on 01/14/2016     rosuvastatin 10 MG tablet  Commonly known as:  CRESTOR  Take 1 tablet (10 mg total) by mouth daily.     TUMS PO  Take 1 tablet by mouth 2 (two) times daily as needed (heartburn). For calcium           Objective:   Physical Exam BP 100/70 mmHg  Pulse 75  Temp(Src) 98 F (36.7 C) (Oral)  Ht 6\' 2"  (1.88 m)  Wt 158 lb 6.4 oz (71.85 kg)  BMI 20.33  kg/m2  SpO2 98%  General:   Well developed, well nourished . NAD.  Neck: No  thyromegaly , normal carotid pulse HEENT:  Normocephalic . Face symmetric, atraumatic Lungs:  CTA B Normal respiratory effort, no intercostal retractions, no accessory muscle use. Heart: RRR,  no murmur.  No pretibial edema bilaterally  Abdomen:  Not distended, soft, non-tender. No rebound or rigidity. Palpable aorta, not tender  Skin: Exposed areas without rash. Not pale. Not jaundice Neurologic:  alert & oriented X3.  Speech normal, gait appropriate for age and unassisted Strength symmetric and appropriate for age.  Psych: Cognition and judgment appear intact.  Cooperative with normal attention span and concentration.  Behavior appropriate. No anxious or depressed appearing.    Assessment & Plan:   Assessment>> Prediabetes, A1c 6.0 (2015) Hyperlipidemia Anxiety, depression: intol to zoloft, Rx lexapro 5 mg 07-2015: intolerant d/t nausea  CV: ---CAD--CABG 2011 ----P- Atrial fibrillation, PVCs. --Multaq, Elliquis  ----Palpable Ao x years, [place] (-) for AAA 2005 COPD (per CXR 06-2015), PFTs 10-04-15 mild obstruction DJD  Gait-- uses a walker prn GI: GERD, IBS, diverticulosis GU: elevated PSA, BPH , (-) bx 2014 Osteoporosis: dexa ~2005 --> rx fosamax, took x 1 year, dexa ~2010 was rec no fosamax at that time. Dexa 09-2015: T score -2.4 --> rx fosamax Lichen planus Headache -- saw neurology 02-2015, had a MRI/MRA (-)  PLAN Prediabetes: Check up A1c Hyperlipidemia: Under excellent control with Crestor, check LFTs. Anxiety depression: Good days and bad days, we talk about counseling, try different medications but is not interested at this point. CAD: Asymptomatic, continue aspirin, Crestor COPD: Asymptomatic on no medications Osteoporosis: cont Fosamax. Occasional fatigue, when he feels that way there is no associated worrisome symptoms. Related to depression? Related to low BP? Recommend to monitor  his BPs. Check a CBC. RTC 6 months

## 2016-03-24 NOTE — Progress Notes (Signed)
Pre visit review using our clinic tool,if applicable. No additional management support is needed unless otherwise documented below in the visit note.  

## 2016-03-25 DIAGNOSIS — J449 Chronic obstructive pulmonary disease, unspecified: Secondary | ICD-10-CM | POA: Insufficient documentation

## 2016-03-25 NOTE — Assessment & Plan Note (Signed)
Prediabetes: Check up A1c Hyperlipidemia: Under excellent control with Crestor, check LFTs. Anxiety depression: Good days and bad days, we talk about counseling, try different medications but is not interested at this point. CAD: Asymptomatic, continue aspirin, Crestor COPD: Asymptomatic on no medications Osteoporosis: cont Fosamax. Occasional fatigue, when he feels that way there is no associated worrisome symptoms. Related to depression? Related to low BP? Recommend to monitor his BPs. Check a CBC. RTC 6 months

## 2016-04-27 ENCOUNTER — Other Ambulatory Visit: Payer: Self-pay | Admitting: Cardiovascular Disease

## 2016-06-20 ENCOUNTER — Emergency Department (HOSPITAL_COMMUNITY): Payer: Medicare Other

## 2016-06-20 ENCOUNTER — Encounter (HOSPITAL_COMMUNITY): Payer: Self-pay | Admitting: Vascular Surgery

## 2016-06-20 ENCOUNTER — Emergency Department (HOSPITAL_COMMUNITY)
Admission: EM | Admit: 2016-06-20 | Discharge: 2016-06-20 | Disposition: A | Discharge status: Home / Self Care | Payer: Medicare Other | Attending: Emergency Medicine | Admitting: Emergency Medicine

## 2016-06-20 DIAGNOSIS — R404 Transient alteration of awareness: Secondary | ICD-10-CM | POA: Diagnosis not present

## 2016-06-20 DIAGNOSIS — Z87891 Personal history of nicotine dependence: Secondary | ICD-10-CM | POA: Insufficient documentation

## 2016-06-20 DIAGNOSIS — I2510 Atherosclerotic heart disease of native coronary artery without angina pectoris: Secondary | ICD-10-CM | POA: Diagnosis not present

## 2016-06-20 DIAGNOSIS — Z7982 Long term (current) use of aspirin: Secondary | ICD-10-CM | POA: Insufficient documentation

## 2016-06-20 DIAGNOSIS — Z7901 Long term (current) use of anticoagulants: Secondary | ICD-10-CM | POA: Diagnosis not present

## 2016-06-20 DIAGNOSIS — Z951 Presence of aortocoronary bypass graft: Secondary | ICD-10-CM | POA: Insufficient documentation

## 2016-06-20 DIAGNOSIS — J449 Chronic obstructive pulmonary disease, unspecified: Secondary | ICD-10-CM | POA: Diagnosis not present

## 2016-06-20 DIAGNOSIS — I493 Ventricular premature depolarization: Secondary | ICD-10-CM | POA: Diagnosis not present

## 2016-06-20 DIAGNOSIS — R002 Palpitations: Secondary | ICD-10-CM | POA: Diagnosis present

## 2016-06-20 DIAGNOSIS — Z79899 Other long term (current) drug therapy: Secondary | ICD-10-CM | POA: Insufficient documentation

## 2016-06-20 DIAGNOSIS — R42 Dizziness and giddiness: Secondary | ICD-10-CM | POA: Diagnosis not present

## 2016-06-20 DIAGNOSIS — R55 Syncope and collapse: Secondary | ICD-10-CM | POA: Diagnosis not present

## 2016-06-20 LAB — URINE MICROSCOPIC-ADD ON: SQUAMOUS EPITHELIAL / LPF: NONE SEEN

## 2016-06-20 LAB — BASIC METABOLIC PANEL
Anion gap: 6 (ref 5–15)
BUN: 16 mg/dL (ref 6–20)
CHLORIDE: 103 mmol/L (ref 101–111)
CO2: 26 mmol/L (ref 22–32)
CREATININE: 0.92 mg/dL (ref 0.61–1.24)
Calcium: 9.5 mg/dL (ref 8.9–10.3)
Glucose, Bld: 108 mg/dL — ABNORMAL HIGH (ref 65–99)
Potassium: 5.2 mmol/L — ABNORMAL HIGH (ref 3.5–5.1)
SODIUM: 135 mmol/L (ref 135–145)

## 2016-06-20 LAB — URINALYSIS, ROUTINE W REFLEX MICROSCOPIC
BILIRUBIN URINE: NEGATIVE
GLUCOSE, UA: NEGATIVE mg/dL
KETONES UR: 15 mg/dL — AB
Leukocytes, UA: NEGATIVE
Nitrite: NEGATIVE
PROTEIN: NEGATIVE mg/dL
Specific Gravity, Urine: 1.014 (ref 1.005–1.030)
pH: 6 (ref 5.0–8.0)

## 2016-06-20 LAB — CBC
HCT: 42.2 % (ref 39.0–52.0)
HEMOGLOBIN: 13.8 g/dL (ref 13.0–17.0)
MCH: 30.1 pg (ref 26.0–34.0)
MCHC: 32.7 g/dL (ref 30.0–36.0)
MCV: 91.9 fL (ref 78.0–100.0)
PLATELETS: 200 10*3/uL (ref 150–400)
RBC: 4.59 MIL/uL (ref 4.22–5.81)
RDW: 16.2 % — ABNORMAL HIGH (ref 11.5–15.5)
WBC: 9.2 10*3/uL (ref 4.0–10.5)

## 2016-06-20 LAB — TROPONIN I

## 2016-06-20 NOTE — ED Notes (Signed)
Pt verbalized understanding of d/c instructions and has no further questions. Pt stable and NAD.  

## 2016-06-20 NOTE — ED Provider Notes (Signed)
[facility] DEPT Provider Note   CSN: [id number] Arrival date & time: 06/20/16  1303  First Provider Contact:  None       History   Chief Complaint Chief Complaint  Patient presents with  . Palpitations    HPI [name] is a 82 y.o. male.  Pt has a hx of a. Fib and was sitting at the piano with his wife at [employer].  The pt said that he felt lightheaded and dizzy.  He said that he felt like he needed to lie down.  He said that his wife has end stage alzheimer's disease and has not eaten nor drank for 5 days.  The pt and his wife have no children and no relatives near by.  Pt said that all of his friends are old too and are not able to help.  Pt breaks down crying on exam when talking about his wife.  He said the palpitations he was feeling earlier are gone and he feels better now.    Palpitations      Past Medical History:  Diagnosis Date  . Atrial fibrillation ([place])   . CAD    a. 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  . Diverticulosis 2011   Diverticulitis 2004  . Esophageal reflux   . Headache    saw neuro 4-16, MRI done  . HYPERPLASIA, PROSTATE NOS W/URINARY OBST/LUTS   . IBS   . INGUINAL HERNIA   . Lichen planus [id number]   [name]  . LUMBAR RADICULOPATHY, RIGHT   . Mixed hyperlipidemia   . OSTEOPOROSIS   . PONV (postoperative nausea and vomiting)   . PREMATURE VENTRICULAR CONTRACTIONS    a. After CABG - was on Coumadin/Multaq for a period of time. Coumadin discontinued 04/2010 after maintaining NSR.  [name] UNSPECIFIED ANEMIA     Patient Active Problem List   Diagnosis Date Noted  . COPD (chronic obstructive pulmonary disease) ([place]) 03/25/2016  . Annual physical exam 03/24/2016  . Prediabetes 03/24/2016  . Chest pressure   . Nausea and vomiting 08/29/2015  . Atypical chest pain 08/29/2015  . Chest pain 08/29/2015  . PCP NOTES >>> 08/27/2015  . Neck pain 07/08/2015  .  Allergic rhinitis 03/09/2015  . Headache 02/23/2015  . Bradycardia 06/15/2014  . Arthritis, degenerative 06/15/2014  . Hyperglycemia 01/08/2014  . Paroxysmal a-fib ([place]) 12/28/2013  . Nausea with vomiting 12/26/2013  . Diverticulosis of colon without hemorrhage 10/13/2013  . INGUINAL HERNIA 12/21/2010  . Mixed hyperlipidemia 04/18/2010  . Coronary atherosclerosis 02/25/2010  . PREMATURE VENTRICULAR CONTRACTIONS 02/08/2010  . HIP FRACTURE, RIGHT 12/13/2009  . IBS 09/01/2009  . FATIGUE 06/28/2009  . DYSPNEA/SHORTNESS OF BREATH 06/28/2009  . LUMBAR RADICULOPATHY, RIGHT 06/09/2009  . Esophageal reflux 09/22/2008  . UNSPECIFIED ANEMIA 05/11/2008  . PALPITATIONS 05/11/2008  . HYPERPLASIA, PROSTATE NOS W/URINARY OBST/LUTS 09/20/2007  . PAIN IN JOINT, UNSPECIFIED SITE 09/20/2007  . Closed fracture of dorsal (thoracic) vertebra without mention of spinal cord injury 09/20/2007  . Osteoporosis 01/01/2007    Past Surgical History:  Procedure Laterality Date  . CATARACT EXTRACTION Right 10/06/2013  . COLONOSCOPY  2011   Diverticulosis; Dr Deatra Ina  . CORONARY ARTERY BYPASS GRAFT     2 VD;anomalous RCA;post op compliacted by AF  . HIP FRACTURE SURGERY Right 03/2007   Dr. Salvadore Farber  . INGUINAL HERNIA REPAIR Left 2012   Dr Brantley Stage  . KNEE SURGERY  Right 1977   repair  . PROSTATE BIOPSY  09/19/2013   [facility]       Home Medications    Prior to Admission medications   Medication Sig Start Date End Date Taking? Authorizing Provider  alendronate (FOSAMAX) 70 MG tablet Take 1 tablet (70 mg total) by mouth every 7 (seven) days. Take with a full glass of water on an empty stomach. 10/18/15   Colon Branch, MD  apixaban (ELIQUIS) 5 MG TABS tablet Take 1 tablet (5 mg total) by mouth 2 (two) times daily. 04/27/16   Josue Hector, MD  aspirin 81 MG tablet Take 81 mg by mouth daily.    Historical Provider, MD  Calcium Carbonate Antacid (TUMS PO) Take 1 tablet by mouth 2 (two) times daily as  needed (heartburn). For calcium    Historical Provider, MD  cholecalciferol (VITAMIN D) 1000 UNITS tablet Take 1,000 Units by mouth every morning.      Historical Provider, MD  dronedarone (MULTAQ) 400 MG tablet TAKE ONE (1) TABLET BY MOUTH TWO (2) TIMES DAILY WITH A MEAL 01/28/16   Josue Hector, MD  ELIQUIS 5 MG TABS tablet TAKE ONE TABLET TWICE DAILY 12/28/15   Josue Hector, MD  folic acid (FOLVITE) 1 MG tablet Take 1 tablet (1 mg total) by mouth daily. 01/28/16   Josue Hector, MD  loratadine (CLARITIN) 10 MG tablet Take 1 tablet (10 mg total) by mouth daily. 07/08/15   Alferd Apa Lowne Chase, DO  mometasone (NASONEX) 50 MCG/ACT nasal spray Place 2 sprays into the nose daily as needed (for congestion). Reported on 11/19/2015    Historical Provider, MD  Multiple Vitamin (MULITIVITAMIN WITH MINERALS) TABS Take 1 tablet by mouth daily.      Historical Provider, MD  nitroGLYCERIN (NITROSTAT) 0.4 MG SL tablet Place 0.4 mg under the tongue every 5 (five) minutes as needed for chest pain (3 doses max). Reported on 01/14/2016    Historical Provider, MD  rosuvastatin (CRESTOR) 10 MG tablet Take 1 tablet (10 mg total) by mouth daily. 03/22/16   Josue Hector, MD    Family History Family History  Problem Relation Age of Onset  . Stroke Mother   . Pancreatic cancer Father     Deceased, 52  . Breast cancer Sister     Deceased  . Heart disease Sister 77  . Colon cancer Neg Hx   . Prostate cancer Neg Hx     Social History Social History  Substance Use Topics  . Smoking status: Former Smoker    Types: Cigarettes    Quit date: 11/27/1962  . Smokeless tobacco: Never Used  . Alcohol use 0.0 oz/week     Comment:  no wine recently     Allergies   Review of patient's allergies indicates no known allergies.   Review of Systems Review of Systems  Cardiovascular: Positive for palpitations.  All other systems reviewed and are negative.    Physical Exam Updated Vital Signs BP 142/63   Pulse (!)  56   Temp 97.7 F (36.5 C) (Oral)   Resp 17   Ht 6\' 2"  (1.88 m)   Wt 160 lb (72.6 kg)   SpO2 98%   BMI 20.54 kg/m   Physical Exam  Constitutional: He is oriented to person, place, and time. He appears well-developed and well-nourished.  HENT:  Head: Normocephalic and atraumatic.  Right Ear: External ear normal.  Left Ear: External ear normal.  Nose: Nose normal.  Mouth/Throat: Oropharynx is clear and moist.  Eyes: Conjunctivae and EOM are normal. Pupils are equal, round, and reactive to light.  Neck: Normal range of motion. Neck supple.  Cardiovascular: Normal rate, regular rhythm, normal heart sounds and intact distal pulses.  Frequent extrasystoles are present.  Pulmonary/Chest: Effort normal and breath sounds normal.  Abdominal: Soft. Bowel sounds are normal.  Musculoskeletal: Normal range of motion.  Neurological: He is alert and oriented to person, place, and time.  Skin: Skin is warm and dry.  Psychiatric: He has a normal mood and affect. His behavior is normal. Judgment and thought content normal.  Nursing note and vitals reviewed.    ED Treatments / Results  Labs (all labs ordered are listed, but only abnormal results are displayed) Labs Reviewed  CBC - Abnormal; Notable for the following:       Result Value   RDW 16.2 (*)    All other components within normal limits  BASIC METABOLIC PANEL - Abnormal; Notable for the following:    Potassium 5.2 (*)    Glucose, Bld 108 (*)    All other components within normal limits  URINALYSIS, ROUTINE W REFLEX MICROSCOPIC (NOT AT [facility]) - Abnormal; Notable for the following:    Hgb urine dipstick SMALL (*)    Ketones, ur 15 (*)    All other components within normal limits  URINE MICROSCOPIC-ADD ON - Abnormal; Notable for the following:    Bacteria, UA RARE (*)    All other components within normal limits  TROPONIN I    EKG  EKG Interpretation  Date/Time:  Tuesday June 20 2016 13:10:04 EDT Ventricular Rate:  65 PR  Interval:    QRS Duration: 112 QT Interval:  406 QTC Calculation: 423 R Axis:   29 Text Interpretation:  Sinus rhythm Multiple ventricular premature complexes Borderline intraventricular conduction delay RSR' in V1 or V2, right VCD or RVH Confirmed by [facility] MD, Lucina Betty ([id number]) on 06/20/2016 1:35:18 PM       Radiology Dg Chest 2 View  Result Date: 06/20/2016 CLINICAL DATA:  Syncope and cardiac palpitations EXAM: CHEST  2 VIEW COMPARISON:  August 29, 2015 and July 08, 2015 FINDINGS: There is scarring in the left upper lobe region. Lungs elsewhere are clear. Heart size and pulmonary vascularity are normal. Patient is status post coronary artery bypass grafting. There is atherosclerotic calcification in the aortic arch region. No adenopathy is evident. Bones are osteoporotic. There is stable anterior wedging of a mid thoracic vertebral body. IMPRESSION: Scarring left upper lobe. No edema or consolidation. Aortic atherosclerosis. Stable cardiac silhouette. Anterior wedging mid thoracic vertebral body, stable. Bones appear osteoporotic. Electronically Signed   By: Lowella Grip III M.D.   On: 06/20/2016 14:07   Procedures Procedures (including critical care time)  Medications Ordered in ED Medications - No data to display   Initial Impression / Assessment and Plan / ED Course  I have reviewed the triage vital signs and the nursing notes.  Pertinent labs & imaging results that were available during my care of the patient were reviewed by me and considered in my medical decision making (see chart for details).  Clinical Course    Pt has had PVCs on the monitor while here.  No a.fib and nl rate.  Pt is under a tremendous amount of stress dealing with his wife.  I think that is contributing.  Pt ok for d/c home.  He knows to f/u with pcp and return if worse.  Final Clinical Impressions(s) /  ED Diagnoses   Final diagnoses:  PVC (premature ventricular contraction)    New  Prescriptions New Prescriptions   No medications on file     Isla Pence, MD 06/20/16 [phone]

## 2016-06-20 NOTE — ED Triage Notes (Signed)
Pt reports to the ED via [facility] from [facility] where he lives in an independent living section. Pt reports he was sitting and watching a piano player with his wife and he developed some lightheadedness/dizziness and palpations. He has hx of atrial fibrillation. On EMS arrival he was in NSR with multiple PVCs and his symptoms had resolved. The symptoms lasted approx 30 minutes. He denies any previous or current CP or SOB. Pt A&Ox4, resp e/u, and skin warm and dry.

## 2016-06-20 NOTE — ED Notes (Signed)
Patient transported to X-ray 

## 2016-06-22 ENCOUNTER — Encounter: Payer: Self-pay | Admitting: Internal Medicine

## 2016-06-22 ENCOUNTER — Ambulatory Visit (INDEPENDENT_AMBULATORY_CARE_PROVIDER_SITE_OTHER): Payer: Medicare Other | Admitting: Internal Medicine

## 2016-06-22 VITALS — BP 124/76 | HR 61 | Temp 98.1°F | Ht 74.0 in | Wt 154.1 lb

## 2016-06-22 DIAGNOSIS — F329 Major depressive disorder, single episode, unspecified: Secondary | ICD-10-CM | POA: Insufficient documentation

## 2016-06-22 DIAGNOSIS — R531 Weakness: Secondary | ICD-10-CM

## 2016-06-22 DIAGNOSIS — F418 Other specified anxiety disorders: Secondary | ICD-10-CM

## 2016-06-22 DIAGNOSIS — F419 Anxiety disorder, unspecified: Principal | ICD-10-CM

## 2016-06-22 MED ORDER — MIRTAZAPINE 15 MG PO TABS
15.0000 mg | ORAL_TABLET | Freq: Every day | ORAL | 3 refills | Status: DC
Start: 2016-06-22 — End: 2016-08-08

## 2016-06-22 NOTE — Progress Notes (Signed)
Subjective:    Patient ID: [name], male    DOB: May 10, 1930, 82 y.o.   MRN: [id number]  DOS:  06/22/2016 Type of visit - description : ER follow-up Interval history: Was seen 06/20/2016, felt dizzy-fainty, had some palpitations. At the ER he also was found to be tearful, crying, he reported + stress due to wife's Alzheimer. EKG ---- sinus rhythm, no acute changes ,multiple PVCs CBC, BMP, essentially negative, potassium was 5.2. Chest x-ray with no acute changes   Wt Readings from Last 3 Encounters:  06/22/16 154 lb 2 oz (69.9 kg)  06/20/16 160 lb (72.6 kg)  03/24/16 158 lb 6.4 oz (71.8 kg)       Review of Systems  Since he left the ER, had a good day yesterday, today he is still feels weak but denies feeling faint. On further questioning, he agrees that he is depressed and anxious because his wife has advanced dementia and he does not have much family around. Denies chest pain or difficulty breathing. No palpitations No blood in the stools or in the urine Sleeps okay, admits to occasional crying spells but no suicidal ideas. Appetite is decreased, denies postprandial pain. Ketones were noted at the urine in the ER.   Past Medical History:  Diagnosis Date  . Atrial fibrillation ([place])   . CAD    a. 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  . Diverticulosis 2011   Diverticulitis 2004  . Esophageal reflux   . Headache    saw neuro 4-16, MRI done  . HYPERPLASIA, PROSTATE NOS W/URINARY OBST/LUTS   . IBS   . INGUINAL HERNIA   . Lichen planus [id number]   [name]  . LUMBAR RADICULOPATHY, RIGHT   . Mixed hyperlipidemia   . OSTEOPOROSIS   . PONV (postoperative nausea and vomiting)   . PREMATURE VENTRICULAR CONTRACTIONS    a. After CABG - was on Coumadin/Multaq for a period of time. Coumadin discontinued 04/2010 after maintaining NSR.  [name] UNSPECIFIED ANEMIA     Past Surgical History:  Procedure  Laterality Date  . CATARACT EXTRACTION Right 10/06/2013  . CORONARY ARTERY BYPASS GRAFT     2 VD;anomalous RCA;post op compliacted by AF  . HIP FRACTURE SURGERY Right 03/2007   Dr. Salvadore Farber  . INGUINAL HERNIA REPAIR Left 2012   Dr Brantley Stage  . KNEE SURGERY Right 1977   repair  . PROSTATE BIOPSY  09/19/2013   [facility]    Social History   Social History  . Marital status: Married    Spouse name: N/A  . Number of children: 0  . Years of education: N/A   Occupational History  . retired Retired   Social History Main Topics  . Smoking status: Former Smoker    Types: Cigarettes    Quit date: 11/27/1962  . Smokeless tobacco: Never Used  . Alcohol use 0.0 oz/week     Comment:  no wine recently  . Drug use: No  . Sexual activity: No   Other Topics Concern  . Not on file   Social History Narrative   Lives at [facility] in independent living.     His wife lives in the skilled nursing area.     Has no children.  Family: nephews-nices in [place]   Retired from [employer].     Still drives.         Medication List  Accurate as of 06/22/16  6:10 PM. Always use your most recent med list.          alendronate 70 MG tablet Commonly known as:  FOSAMAX Take 1 tablet (70 mg total) by mouth every 7 (seven) days. Take with a full glass of water on an empty stomach.   aspirin 81 MG tablet Take 81 mg by mouth daily.   cholecalciferol 1000 units tablet Commonly known as:  VITAMIN D Take 1,000 Units by mouth every morning.   dronedarone 400 MG tablet Commonly known as:  MULTAQ TAKE ONE (1) TABLET BY MOUTH TWO (2) TIMES DAILY WITH A MEAL   ELIQUIS 5 MG Tabs tablet Generic drug:  apixaban TAKE ONE TABLET TWICE DAILY   folic acid 1 MG tablet Commonly known as:  FOLVITE Take 1 tablet (1 mg total) by mouth daily.   mirtazapine 15 MG tablet Commonly known as:  REMERON Take 1 tablet (15 mg total) by mouth at bedtime.   mometasone 50 MCG/ACT nasal  spray Commonly known as:  NASONEX Place 2 sprays into the nose daily as needed (for congestion). Reported on 11/19/2015   multivitamin with minerals Tabs tablet Take 1 tablet by mouth daily.   nitroGLYCERIN 0.4 MG SL tablet Commonly known as:  NITROSTAT Place 0.4 mg under the tongue every 5 (five) minutes as needed for chest pain (3 doses max). Reported on 01/14/2016   rosuvastatin 10 MG tablet Commonly known as:  CRESTOR Take 1 tablet (10 mg total) by mouth daily.   TUMS PO Take 1 tablet by mouth 2 (two) times daily as needed (heartburn). For calcium          Objective:   Physical Exam BP 124/76 (BP Location: Left Arm, Patient Position: Sitting, Cuff Size: Normal)   Pulse 61   Temp 98.1 F (36.7 C) (Oral)   Ht 6\' 2"  (1.88 m)   Wt 154 lb 2 oz (69.9 kg)   SpO2 97%   BMI 19.79 kg/m  General:   Well developed, well nourished . NAD.  HEENT:  Normocephalic . Face symmetric, atraumatic Lungs:  CTA B Normal respiratory effort, no intercostal retractions, no accessory muscle use. Heart: RRR,  no murmur.  no pretibial edema bilaterally  Abdomen:  Not distended, soft, non-tender. No rebound or rigidity.  Skin: Not pale. Not jaundice Neurologic:  alert & oriented X3.  Speech normal, gait appropriate for age and unassisted Psych--  Cognition and judgment appear intact.  Cooperative with normal attention span and concentration.  Behavior appropriate. Slightly depressed > anxious appearing.    Assessment & Plan:   Assessment>> Prediabetes, A1c 6.0 (2015) Hyperlipidemia Anxiety, depression: intol to zoloft, Rx lexapro 5 mg 07-2015: intolerant d/t nausea  CV: ---CAD--CABG 2011 ----P- Atrial fibrillation, PVCs. --Multaq, Elliquis  ----Palpable Ao x years, [place] (-) for AAA 2005 COPD (per CXR 06-2015), PFTs 10-04-15 mild obstruction DJD  Gait-- uses a walker prn GI: GERD, IBS, diverticulosis GU: elevated PSA, BPH , (-) bx 2014 Osteoporosis: dexa ~2005 --> rx fosamax, took  x 1 year, dexa ~2010 was rec no fosamax at that time. Dexa 09-2015: T score -2.4 --> rx fosamax Lichen planus Headache -- saw neurology 02-2015, had a MRI/MRA (-)  PLAN Weakness: S/P ER eval, workup negative, no further evaluation needed at this point Anxiety depression: I think this is his main problem today  triggered by his wife dementia and feeling lonely. He has lost a few pounds, appetite is decreased which supports the diagnosis. PHQ  9 #5 , c/w mild depression  Plan: Counseled today and recommend to see a counselor Start medications, he agrees, previously didn't like the medications he was prescribed (Lexapro, Zoloft). Recommend a trial with Remeron 15 mg qhs RTC 6 weeks

## 2016-06-22 NOTE — Progress Notes (Signed)
Pre visit review using our clinic review tool, if applicable. No additional management support is needed unless otherwise documented below in the visit note. 

## 2016-06-22 NOTE — Assessment & Plan Note (Signed)
Weakness: S/P ER eval, workup negative, no further evaluation needed at this point Anxiety depression: I think this is his main problem today  triggered by his wife dementia and feeling lonely. He has lost a few pounds, appetite is decreased which supports the diagnosis. PHQ 9 #5 , c/w mild depression  Plan: Counseled today and recommend to see a counselor Start medications, he agrees, previously didn't like the medications he was prescribed (Lexapro, Zoloft). Recommend a trial with Remeron 15 mg qhs RTC 6 weeks

## 2016-06-22 NOTE — Patient Instructions (Addendum)
GO TO THE FRONT DESK Schedule your next appointment for a  Check up in 6 weeks   In addition to your routine medications, start Remeron 15 mg one tablet every night.  Consider see one of our counselors or somebody at your place.

## 2016-06-30 ENCOUNTER — Emergency Department (HOSPITAL_COMMUNITY)
Admission: EM | Admit: 2016-06-30 | Discharge: 2016-06-30 | Disposition: A | Discharge status: Home / Self Care | Payer: Medicare Other | Attending: Emergency Medicine | Admitting: Emergency Medicine

## 2016-06-30 ENCOUNTER — Emergency Department (HOSPITAL_COMMUNITY): Payer: Medicare Other

## 2016-06-30 ENCOUNTER — Encounter (HOSPITAL_COMMUNITY): Payer: Self-pay | Admitting: *Deleted

## 2016-06-30 DIAGNOSIS — Z951 Presence of aortocoronary bypass graft: Secondary | ICD-10-CM | POA: Diagnosis not present

## 2016-06-30 DIAGNOSIS — J449 Chronic obstructive pulmonary disease, unspecified: Secondary | ICD-10-CM | POA: Insufficient documentation

## 2016-06-30 DIAGNOSIS — R079 Chest pain, unspecified: Secondary | ICD-10-CM | POA: Diagnosis not present

## 2016-06-30 DIAGNOSIS — Z7901 Long term (current) use of anticoagulants: Secondary | ICD-10-CM | POA: Insufficient documentation

## 2016-06-30 DIAGNOSIS — Z7982 Long term (current) use of aspirin: Secondary | ICD-10-CM | POA: Diagnosis not present

## 2016-06-30 DIAGNOSIS — Z87891 Personal history of nicotine dependence: Secondary | ICD-10-CM | POA: Insufficient documentation

## 2016-06-30 DIAGNOSIS — I2510 Atherosclerotic heart disease of native coronary artery without angina pectoris: Secondary | ICD-10-CM | POA: Insufficient documentation

## 2016-06-30 DIAGNOSIS — I493 Ventricular premature depolarization: Secondary | ICD-10-CM | POA: Diagnosis not present

## 2016-06-30 DIAGNOSIS — R42 Dizziness and giddiness: Secondary | ICD-10-CM | POA: Diagnosis not present

## 2016-06-30 DIAGNOSIS — R0789 Other chest pain: Secondary | ICD-10-CM | POA: Diagnosis not present

## 2016-06-30 LAB — I-STAT TROPONIN, ED
TROPONIN I, POC: 0.13 ng/mL — AB (ref 0.00–0.08)
Troponin i, poc: 0 ng/mL (ref 0.00–0.08)

## 2016-06-30 LAB — TROPONIN I

## 2016-06-30 LAB — URINALYSIS, ROUTINE W REFLEX MICROSCOPIC
BILIRUBIN URINE: NEGATIVE
Glucose, UA: NEGATIVE mg/dL
Hgb urine dipstick: NEGATIVE
Ketones, ur: 15 mg/dL — AB
LEUKOCYTES UA: NEGATIVE
NITRITE: NEGATIVE
PH: 7 (ref 5.0–8.0)
Protein, ur: NEGATIVE mg/dL
SPECIFIC GRAVITY, URINE: 1.009 (ref 1.005–1.030)

## 2016-06-30 LAB — BASIC METABOLIC PANEL
Anion gap: 6 (ref 5–15)
BUN: 13 mg/dL (ref 6–20)
CHLORIDE: 102 mmol/L (ref 101–111)
CO2: 28 mmol/L (ref 22–32)
CREATININE: 0.88 mg/dL (ref 0.61–1.24)
Calcium: 9.7 mg/dL (ref 8.9–10.3)
GFR calc non Af Amer: >60 mL/min (ref >60)
Glucose, Bld: 106 mg/dL — ABNORMAL HIGH (ref 65–99)
POTASSIUM: 4.6 mmol/L (ref 3.5–5.1)
Sodium: 136 mmol/L (ref 135–145)

## 2016-06-30 LAB — CBC
HEMATOCRIT: 42 % (ref 39.0–52.0)
Hemoglobin: 13.7 g/dL (ref 13.0–17.0)
MCH: 30.4 pg (ref 26.0–34.0)
MCHC: 32.6 g/dL (ref 30.0–36.0)
MCV: 93.1 fL (ref 78.0–100.0)
PLATELETS: 186 10*3/uL (ref 150–400)
RBC: 4.51 MIL/uL (ref 4.22–5.81)
RDW: 15.9 % — ABNORMAL HIGH (ref 11.5–15.5)
WBC: 8.8 10*3/uL (ref 4.0–10.5)

## 2016-06-30 NOTE — ED Provider Notes (Signed)
[facility] DEPT Provider Note   CSN: [id number] Arrival date & time: 06/30/16  1055  First Provider Contact:  None       History   Chief Complaint Chief Complaint  Patient presents with  . Dizziness    HPI [name] is a 82 y.o. male who presents with dizziness/lightheadedness. PMH significant for CAD s/p CABG, A.fib currently on Multaq, GERD, motion sickness, anemia. He was seen on 7/25 for the same and it was believed his dizziness was related to stress/anxiety due to his wife who had end stage dementia. She recently passed away on 2023-07-06. He was instructed to follow up with his PCP within 48 hours of his ED visit which he did. He was started on Remeron however he stated this made him more dizzy and he could not tolerate it therefore he stopped this med. He states he was in his kitchen today making breakfast and when he turned his head had the feeling of dizziness. He reports associated nausea at the time and states he lost his appetite. He had to go to the funeral home today to make arrangements - the funeral is tomorrow. He continued to feel dizzy therefore came to the ED for further evaluation. He reports associated chest pain which is on the left side of his chest, non-radiating, and non-exertional. Denies fever, chills, HA, vision changes, numbness, weakness, palpitations, leg swelling, SOB, cough, abdominal pain, vomiting, diarrhea, dysuria.   HPI  Past Medical History:  Diagnosis Date  . Atrial fibrillation ([place])   . CAD    a. 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  . Diverticulosis 2011   Diverticulitis 2004  . Esophageal reflux   . Headache    saw neuro 4-16, MRI done  . HYPERPLASIA, PROSTATE NOS W/URINARY OBST/LUTS   . IBS   . INGUINAL HERNIA   . Lichen planus [id number]   [name]  . LUMBAR RADICULOPATHY, RIGHT   . Mixed hyperlipidemia   . OSTEOPOROSIS   . PONV (postoperative nausea and  vomiting)   . PREMATURE VENTRICULAR CONTRACTIONS    a. After CABG - was on Coumadin/Multaq for a period of time. Coumadin discontinued 04/2010 after maintaining NSR.  [name] UNSPECIFIED ANEMIA     Patient Active Problem List   Diagnosis Date Noted  . Anxiety and depression 06/22/2016  . COPD (chronic obstructive pulmonary disease) ([place]) 03/25/2016  . Annual physical exam 03/24/2016  . Prediabetes 03/24/2016  . Chest pressure   . Atypical chest pain 08/29/2015  . Chest pain 08/29/2015  . PCP NOTES >>>>>>>>>>>>>> 08/27/2015  . Neck pain 07/08/2015  . Allergic rhinitis 03/09/2015  . Headache 02/23/2015  . Bradycardia 06/15/2014  . Arthritis, degenerative 06/15/2014  . Hyperglycemia 01/08/2014  . Paroxysmal a-fib ([place]) 12/28/2013  . Nausea with vomiting 12/26/2013  . Diverticulosis of colon without hemorrhage 10/13/2013  . INGUINAL HERNIA 12/21/2010  . Mixed hyperlipidemia 04/18/2010  . Coronary atherosclerosis 02/25/2010  . PREMATURE VENTRICULAR CONTRACTIONS 02/08/2010  . HIP FRACTURE, RIGHT 12/13/2009  . IBS 09/01/2009  . Weakness 06/28/2009  . DYSPNEA/SHORTNESS OF BREATH 06/28/2009  . LUMBAR RADICULOPATHY, RIGHT 06/09/2009  . Esophageal reflux 09/22/2008  . UNSPECIFIED ANEMIA 05/11/2008  . PALPITATIONS 05/11/2008  . HYPERPLASIA, PROSTATE NOS W/URINARY OBST/LUTS 09/20/2007  . PAIN IN JOINT, UNSPECIFIED SITE 09/20/2007  . Closed fracture of dorsal (thoracic) vertebra without mention of spinal cord injury 09/20/2007  . Osteoporosis 01/01/2007  Past Surgical History:  Procedure Laterality Date  . CATARACT EXTRACTION Right 10/06/2013  . CORONARY ARTERY BYPASS GRAFT     2 VD;anomalous RCA;post op compliacted by AF  . HIP FRACTURE SURGERY Right 03/2007   Dr. Salvadore Farber  . INGUINAL HERNIA REPAIR Left 2012   Dr Brantley Stage  . KNEE SURGERY Right 1977   repair  . PROSTATE BIOPSY  09/19/2013   [facility]       Home Medications    Prior to Admission medications     Medication Sig Start Date End Date Taking? Authorizing Provider  alendronate (FOSAMAX) 70 MG tablet Take 1 tablet (70 mg total) by mouth every 7 (seven) days. Take with a full glass of water on an empty stomach. 10/18/15  Yes Colon Branch, MD  aspirin 81 MG tablet Take 81 mg by mouth daily.   Yes Historical Provider, MD  Calcium Carbonate Antacid (TUMS PO) Take 1 tablet by mouth 2 (two) times daily as needed (heartburn). For calcium   Yes Historical Provider, MD  cholecalciferol (VITAMIN D) 1000 UNITS tablet Take 1,000 Units by mouth every morning.     Yes Historical Provider, MD  dronedarone (MULTAQ) 400 MG tablet TAKE ONE (1) TABLET BY MOUTH TWO (2) TIMES DAILY WITH A MEAL 01/28/16  Yes Josue Hector, MD  ELIQUIS 5 MG TABS tablet TAKE ONE TABLET TWICE DAILY 12/28/15  Yes Josue Hector, MD  folic acid (FOLVITE) 1 MG tablet Take 1 tablet (1 mg total) by mouth daily. 01/28/16  Yes Josue Hector, MD  mirtazapine (REMERON) 15 MG tablet Take 1 tablet (15 mg total) by mouth at bedtime. 06/22/16  Yes Colon Branch, MD  Multiple Vitamin (MULITIVITAMIN WITH MINERALS) TABS Take 1 tablet by mouth daily.     Yes Historical Provider, MD  nitroGLYCERIN (NITROSTAT) 0.4 MG SL tablet Place 0.4 mg under the tongue every 5 (five) minutes as needed for chest pain (3 doses max). Reported on 01/14/2016   Yes Historical Provider, MD  rosuvastatin (CRESTOR) 10 MG tablet Take 1 tablet (10 mg total) by mouth daily. 03/22/16  Yes Josue Hector, MD    Family History Family History  Problem Relation Age of Onset  . Stroke Mother   . Pancreatic cancer Father     Deceased, 38  . Breast cancer Sister     Deceased  . Heart disease Sister 34  . Colon cancer Neg Hx   . Prostate cancer Neg Hx     Social History Social History  Substance Use Topics  . Smoking status: Former Smoker    Types: Cigarettes    Quit date: 11/27/1962  . Smokeless tobacco: Never Used  . Alcohol use 0.0 oz/week     Comment:  no wine recently      Allergies   Review of patient's allergies indicates no known allergies.   Review of Systems Review of Systems  Constitutional: Negative for chills and fever.  HENT: Negative for ear pain and tinnitus.   Eyes: Negative for visual disturbance.  Respiratory: Negative for shortness of breath.   Cardiovascular: Positive for chest pain. Negative for palpitations and leg swelling.  Gastrointestinal: Positive for nausea. Negative for abdominal pain, diarrhea and vomiting.  Genitourinary: Negative for dysuria.  Musculoskeletal: Negative for arthralgias and gait problem.  Neurological: Positive for dizziness and light-headedness. Negative for syncope, speech difficulty, weakness, numbness and headaches.  Psychiatric/Behavioral: Positive for dysphoric mood. The patient is nervous/anxious.   All other systems reviewed and are  negative.    Physical Exam Updated Vital Signs BP 151/78   Pulse 70   Temp 97.4 F (36.3 C) (Oral)   Resp 17   SpO2 100%   Physical Exam  Constitutional: He is oriented to person, place, and time. He appears well-developed and well-nourished. No distress.  Pleasant elderly male. Tearful at times  HENT:  Head: Normocephalic and atraumatic.  Right Ear: Hearing, tympanic membrane, external ear and ear canal normal.  Left Ear: Hearing, tympanic membrane, external ear and ear canal normal.  Eyes: Conjunctivae are normal. Pupils are equal, round, and reactive to light. Right eye exhibits no discharge. Left eye exhibits no discharge. No scleral icterus. Right eye exhibits nystagmus. Left eye exhibits nystagmus.  Neck: Normal range of motion. Neck supple.  Cardiovascular: Normal rate, S1 normal and S2 normal.  An irregular rhythm present. Exam reveals no gallop and no friction rub.   No murmur heard. Pulses:      Radial pulses are 2+ on the right side, and 2+ on the left side.  Pulmonary/Chest: Effort normal and breath sounds normal. No respiratory distress. He has  no wheezes. He has no rales. He exhibits no tenderness.  Abdominal: Soft. Bowel sounds are normal. He exhibits no distension and no mass. There is no tenderness. There is no rebound and no guarding. No hernia.  Musculoskeletal: He exhibits no edema.  Neurological: He is alert and oriented to person, place, and time.  Mental Status:  Alert, oriented, thought content appropriate, able to give a coherent history. Speech fluent without evidence of aphasia. Able to follow 2 step commands without difficulty.  Cranial Nerves:  II:  Peripheral visual fields grossly normal, pupils equal, round, reactive to light III,IV, VI: ptosis not present, extra-ocular motions intact bilaterally with end beat nystagmus V,VII: smile symmetric, facial light touch sensation equal VIII: hearing grossly normal to voice  X: uvula elevates symmetrically  XI: bilateral shoulder shrug symmetric and strong XII: midline tongue extension without fassiculations Motor:  Normal tone. 5/5 in upper and lower extremities bilaterally including strong and equal grip strength and dorsiflexion/plantar flexion Sensory: Pinprick and light touch normal in all extremities.  Deep Tendon Reflexes: 2+ and symmetric in the biceps and patella Cerebellar: normal finger-to-nose with bilateral upper extremities Gait: normal gait and balance - feels subjectively lightheaded when standing CV: distal pulses palpable throughout    Skin: Skin is warm and dry. He is not diaphoretic.  Psychiatric: He has a normal mood and affect.  Nursing note and vitals reviewed.    ED Treatments / Results  Labs (all labs ordered are listed, but only abnormal results are displayed) Labs Reviewed  BASIC METABOLIC PANEL - Abnormal; Notable for the following:       Result Value   Glucose, Bld 106 (*)    All other components within normal limits  CBC - Abnormal; Notable for the following:    RDW 15.9 (*)    All other components within normal limits   URINALYSIS, ROUTINE W REFLEX MICROSCOPIC (NOT AT [facility]) - Abnormal; Notable for the following:    Ketones, ur 15 (*)    All other components within normal limits  I-STAT TROPOININ, ED - Abnormal; Notable for the following:    Troponin i, poc 0.13 (*)    All other components within normal limits  TROPONIN I  Randolm Idol, ED    EKG  EKG Interpretation  Date/Time:  Friday June 30 2016 11:03:10 EDT Ventricular Rate:  77 PR Interval:  172  QRS Duration: 104 QT Interval:  408 QTC Calculation: 461 R Axis:   47 Text Interpretation:  Sinus rhythm with Premature supraventricular complexes and with frequent and consecutive Premature ventricular complexes Incomplete right bundle branch block Abnormal ECG Since last tracing ectopy still present Confirmed by MILLER  MD, BRIAN ([id number]) on 06/30/2016 12:40:46 PM       Radiology Dg Chest 2 View  Result Date: 06/30/2016 CLINICAL DATA:  Chest pain for 1 day EXAM: CHEST  2 VIEW COMPARISON:  June 20, 2016 FINDINGS: There is scarring in the left upper lobe, stable. There is bilateral apical pleural thickening, stable. There is no edema or consolidation. The heart size and pulmonary vascularity are normal. Patient is status post coronary artery bypass grafting. There is atherosclerotic calcification in the aorta. No adenopathy. There is stable anterior wedging of a mid thoracic vertebral body. Bones appear somewhat osteoporotic. No pneumothorax. IMPRESSION: No appreciable change compared to recent prior study. Stable scarring left upper lobe with bilateral apical pleural thickening, stable. No new opacity. Stable cardiac silhouette. Aortic atherosclerosis. Electronically Signed   By: Lowella Grip III M.D.   On: 06/30/2016 11:42    Procedures Procedures (including critical care time)  Medications Ordered in ED Medications - No data to display   Initial Impression / Assessment and Plan / ED Course  I have reviewed the triage vital signs and the  nursing notes.  Pertinent labs & imaging results that were available during my care of the patient were reviewed by me and considered in my medical decision making (see chart for details).  Clinical Course  Comment By Time  The pt has a hx of being on SSRI in thep ast and caused dizziness - placed on again last week after he started with increased depressions surrounding his wife's terminal state (she died 2 days ago) = he was seen in the emergency department, he was  discharged and followed up with his family doctor who started the medications. He reports that twice over the last week since starting this medication after taking it he has become acutely lightheaded. Today while he was at home getting ready to go to the funeral home to pay the funeral home fees after his wife died 2 days ago he developed recurrent lightheadedness and near syncope. During this time he did have a slight left-sided chest pain but states it has been intermittent, not the same as his prior cardiac symptoms when he had a CABG many years ago. He has no shortness of breath, no palpitations however on the monitor he does have frequent PVCs. This is not unlike his prior EKG. His labs thus far are rather unremarkable with no anemia, no electrolyte deficiencies, no changes on the chest x-ray compared with prior. The etiology of the patient's symptoms could be related to medication, to anxiety and depression given his recent loss, would also consider that this could be related to a cardiac function as he does have PVCs approximately every 10 seconds. There is no signs of toxic arrhythmia Noemi Chapel, MD 08/1 731   82 year old male presents with dizziness/lightheadedness with chest pain. Patient is afebrile, not tachycardic or tachypneic, normotensive, and not hypoxic. Unclear etiology. It is possible this is stress related as he has been caring for wife with dementia for over the past 10 years and she has passed within the last couple  days, he has had decreased PO intake, and frequent PVCs. Remer Macho is tomorrow. Second troponin was .13 however repeat is  normal so feel this is erroneous. Shared visit with Dr. Sabra Heck  Shared medical decision making. Discussed that due to his symptoms that we would offer admission and recommend however it would be reasonable to be discharged home with close follow up with his PCP and Cardiology. Recommend no driving at this time. Patient states he would like to be discharged so he can attend his wife's funeral. Patient is NAD, non-toxic, with stable VS. Patient is informed of clinical course, understands medical decision making process, and agrees with plan. Opportunity for questions provided and all questions answered. Strict return precautions given.   Final Clinical Impressions(s) / ED Diagnoses   Final diagnoses:  Ventricular ectopy  Lightheadedness  Chest pain, unspecified chest pain type    New Prescriptions New Prescriptions   No medications on file     Recardo Evangelist, PA-C 06/30/16 1957    Noemi Chapel, MD 07/01/16 [phone]

## 2016-06-30 NOTE — ED Triage Notes (Signed)
Pt reports dizziness, pt states, "I got up & had breakfast & wrote a check. I drove to [employer] because my wife is a corpse over there & I got dizzy and they brought me over here." pt c/o L sided CP today, SOB, n/v/d, A&O x4

## 2016-08-08 ENCOUNTER — Ambulatory Visit (INDEPENDENT_AMBULATORY_CARE_PROVIDER_SITE_OTHER): Payer: Medicare Other | Admitting: Internal Medicine

## 2016-08-08 ENCOUNTER — Encounter: Payer: Self-pay | Admitting: Internal Medicine

## 2016-08-08 VITALS — BP 122/76 | HR 67 | Temp 97.8°F | Resp 14 | Ht 74.0 in | Wt 156.0 lb

## 2016-08-08 DIAGNOSIS — R634 Abnormal weight loss: Secondary | ICD-10-CM | POA: Diagnosis not present

## 2016-08-08 DIAGNOSIS — F418 Other specified anxiety disorders: Secondary | ICD-10-CM | POA: Diagnosis not present

## 2016-08-08 DIAGNOSIS — R5383 Other fatigue: Secondary | ICD-10-CM

## 2016-08-08 DIAGNOSIS — F329 Major depressive disorder, single episode, unspecified: Secondary | ICD-10-CM

## 2016-08-08 DIAGNOSIS — F419 Anxiety disorder, unspecified: Principal | ICD-10-CM

## 2016-08-08 LAB — TSH: TSH: 2.36 u[IU]/mL (ref 0.35–4.50)

## 2016-08-08 NOTE — Progress Notes (Signed)
Subjective:    Patient ID: [name], male    DOB: 03/13/1930, 82 y.o.   MRN: [id number]  DOS:  08/08/2016 Type of visit - description : f/u  Interval history: Anxiety depression: 2 months ago was prescribed Remeron, he tried for few days, at the time he had a lot of stress, developed lightheadedness and near syncope consequently went to the ER. he also had some chest pain. ER visit 06/30/2016 reviewed:  EKG at baseline, labs were okay, chest x-ray unchanged. One of the troponin was elevated but repeated one was normal, thus consider erroneous. They felt that part of the lightheadedness was due to Remeron, it was stopped, he was released home. Subsequently he lost his wife.  At this point he is doing okay emotionally, recognizes that his stress level has decrease, he is already going to a support group for withowers  Wt Readings from Last 3 Encounters:  08/08/16 156 lb (70.8 kg)  06/22/16 154 lb 2 oz (69.9 kg)  06/20/16 160 lb (72.6 kg)     Review of Systems Denies chest pain, difficulty breathing. No suicidal ideas. He still has occasional dizziness but symptoms are definitely decrease and mild. Occasionally has posterior neck pain, mild, no radiation, has not required any OTCs.  Past Medical History:  Diagnosis Date  . Atrial fibrillation ([place])   . CAD    a. 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  . Diverticulosis 2011   Diverticulitis 2004  . Esophageal reflux   . Headache    saw neuro 4-16, MRI done  . HYPERPLASIA, PROSTATE NOS W/URINARY OBST/LUTS   . IBS   . INGUINAL HERNIA   . Lichen planus [id number]   [name]  . LUMBAR RADICULOPATHY, RIGHT   . Mixed hyperlipidemia   . OSTEOPOROSIS   . PONV (postoperative nausea and vomiting)   . PREMATURE VENTRICULAR CONTRACTIONS    a. After CABG - was on Coumadin/Multaq for a period of time. Coumadin discontinued 04/2010 after maintaining NSR.  [name]  UNSPECIFIED ANEMIA     Past Surgical History:  Procedure Laterality Date  . CATARACT EXTRACTION Right 10/06/2013  . CORONARY ARTERY BYPASS GRAFT     2 VD;anomalous RCA;post op compliacted by AF  . HIP FRACTURE SURGERY Right 03/2007   Dr. Salvadore Farber  . INGUINAL HERNIA REPAIR Left 2012   Dr Brantley Stage  . KNEE SURGERY Right 1977   repair  . PROSTATE BIOPSY  09/19/2013   [facility]    Social History   Social History  . Marital status: Widowed    Spouse name: N/A  . Number of children: 0  . Years of education: N/A   Occupational History  . retired Retired   Social History Main Topics  . Smoking status: Former Smoker    Types: Cigarettes    Quit date: 11/27/1962  . Smokeless tobacco: Never Used  . Alcohol use 0.0 oz/week     Comment:  no wine recently  . Drug use: No  . Sexual activity: No   Other Topics Concern  . Not on file   Social History Narrative   Lives at [facility] in independent living.     Lost wife 06-2016.     Has no children.  Family: nephews-nices in [place]   Retired from [employer].     Still drives.         Medication List  Accurate as of 08/08/16  6:50 PM. Always use your most recent med list.          alendronate 70 MG tablet Commonly known as:  FOSAMAX Take 1 tablet (70 mg total) by mouth every 7 (seven) days. Take with a full glass of water on an empty stomach.   aspirin 81 MG tablet Take 81 mg by mouth daily.   cholecalciferol 1000 units tablet Commonly known as:  VITAMIN D Take 1,000 Units by mouth every morning.   dronedarone 400 MG tablet Commonly known as:  MULTAQ TAKE ONE (1) TABLET BY MOUTH TWO (2) TIMES DAILY WITH A MEAL   ELIQUIS 5 MG Tabs tablet Generic drug:  apixaban TAKE ONE TABLET TWICE DAILY   folic acid 1 MG tablet Commonly known as:  FOLVITE Take 1 tablet (1 mg total) by mouth daily.   multivitamin with minerals Tabs tablet Take 1 tablet by mouth daily.   nitroGLYCERIN 0.4 MG SL  tablet Commonly known as:  NITROSTAT Place 0.4 mg under the tongue every 5 (five) minutes as needed for chest pain (3 doses max). Reported on 01/14/2016   rosuvastatin 10 MG tablet Commonly known as:  CRESTOR Take 1 tablet (10 mg total) by mouth daily.   TUMS PO Take 1 tablet by mouth 2 (two) times daily as needed (heartburn). For calcium          Objective:   Physical Exam BP 122/76 (BP Location: Left Arm, Patient Position: Sitting, Cuff Size: Small)   Pulse 67   Temp 97.8 F (36.6 C) (Oral)   Resp 14   Ht 6\' 2"  (1.88 m)   Wt 156 lb (70.8 kg)   SpO2 96%   BMI 20.03 kg/m  General:   Well developed, BMI 20 but he is healthy-appearing . NAD.  HEENT:  Normocephalic . Face symmetric, atraumatic Lungs:  CTA B Normal respiratory effort, no intercostal retractions, no accessory muscle use. Heart: RRR,  no murmur.  No pretibial edema bilaterally  Skin: Not pale. Not jaundice Neurologic:  alert & oriented X3.  Speech normal, gait appropriate for age and unassisted Psych--  Cognition and judgment appear intact.  Cooperative with normal attention span and concentration.  Behavior appropriate. No anxious or depressed appearing, he seems much improved emotionally compared to 2 months ago     Assessment & Plan:    Assessment>> Prediabetes, A1c 6.0 (2015) Hyperlipidemia Anxiety, depression: intol to zoloft, Rx lexapro 5 mg 07-2015: intolerant d/t nausea  CV: ---CAD--CABG 2011 ----P- Atrial fibrillation, PVCs. --Multaq, Elliquis  ----Palpable Ao x years, [place] (-) for AAA 2005 COPD (per CXR 06-2015), PFTs 10-04-15 mild obstruction DJD  Gait-- uses a walker prn GI: GERD, IBS, diverticulosis GU: elevated PSA, BPH , (-) bx 2014 Osteoporosis: dexa ~2005 --> rx fosamax, took x 1 year, dexa ~2010 was rec no fosamax at that time. Dexa 09-2015: T score -2.4 --> rx fosamax Lichen planus Headache -- saw neurology 02-2015, had a MRI/MRA (-)  PLAN Weakness, lightheadedness: s/p  ER  eval, w/u again was negative, he feels better Anxiety depression: Stress decreased since he lost his wife few weeks ago, was intolerant to Remeron, currently doing well emotionally for the circumstances. He is already engage on a counseling group. Recommend to call if needed. Weight loss: Has lost weight compared to last year however weight is now stable, I anticipate he will go back to his original weight now that he is doing better emotionally. For completeness we'll check a TSH.  Also c/o  loosing his muscle mass, recommend formal PT, he declines, states that there is a trainer and a weight room at the facility he lives, encouraged to exercise regularly. CV: seen by cardiology 10-2015, follow-up 1 year. He seems stable at this point. RTC 4 months

## 2016-08-08 NOTE — Assessment & Plan Note (Signed)
Weakness, lightheadedness: s/p  ER eval, w/u again was negative, he feels better Anxiety depression: Stress decreased since he lost his wife few weeks ago, was intolerant to Remeron, currently doing well emotionally for the circumstances. He is already engage on a counseling group. Recommend to call if needed. Weight loss: Has lost weight compared to last year however weight is now stable, I anticipate he will go back to his original weight now that he is doing better emotionally. For completeness we'll check a TSH. Also c/o  loosing his muscle mass, recommend formal PT, he declines, states that there is a trainer and a weight room at the facility he lives, encouraged to exercise regularly. CV: seen by cardiology 10-2015, follow-up 1 year. He seems stable at this point. RTC 4 months

## 2016-08-08 NOTE — Patient Instructions (Signed)
GO TO THE LAB : Get the blood work     GO TO THE FRONT DESK Schedule your next appointment for a  routine checkup in 4 months, please change your current appointment

## 2016-08-08 NOTE — Progress Notes (Signed)
Pre visit review using our clinic review tool, if applicable. No additional management support is needed unless otherwise documented below in the visit note. 

## 2016-08-09 DIAGNOSIS — H26491 Other secondary cataract, right eye: Secondary | ICD-10-CM | POA: Diagnosis not present

## 2016-08-09 DIAGNOSIS — H2512 Age-related nuclear cataract, left eye: Secondary | ICD-10-CM | POA: Diagnosis not present

## 2016-08-09 DIAGNOSIS — Z961 Presence of intraocular lens: Secondary | ICD-10-CM | POA: Diagnosis not present

## 2016-08-25 DIAGNOSIS — Z23 Encounter for immunization: Secondary | ICD-10-CM | POA: Diagnosis not present

## 2016-09-08 NOTE — Progress Notes (Signed)
Patient ID: [name], male   DOB: 03/26/1930, 82 y.o.   MRN: [id number] [name] is an 82 y.o.  M with history of CAD s/p CABGx3 in [id number] complicated by post-op atrial fibrillation, hyperlipidemia, GERD He has history of AF after CABG treated with Multaq but had converted to NSR and Coumadin was discontinued in 04/2010. Hospitalized in January 2015 with nausea and GI illness Afib at time d/c with Eliquis , cardizem beta blocker and Multaq 4/15 ECG shows SB rate 45 Some fatigue Toprol stopped 4/15  HR improved  On Protonix and GI symptoms persist on probiotic now  Myovue 09/08/15 reviewed low risk   Nuclear stress EF: 62%.  The left ventricular ejection fraction is normal (55-65%).  1 mm of Horizontal ST segment depression was noted during stress in the II, III, aVF, V6, V5 and V4 leads.  Defect 1: There is a moderate sized defect of mild severity present in the basal inferoseptal, basal inferior, basal inferolateral, apical anterior and apical septal location. This is a fixed defect and in the setting of normal LVF this is most consistent with diaphragamatic attenuation artifact.  This is a low risk study.  Wife passed in July had bad Alzheimer's  On folate for anemia improved  On Fosamax for osteoporosis now    ROS: Denies fever, malais, weight loss, blurry vision, decreased visual acuity, cough, sputum, SOB, hemoptysis, pleuritic pain, palpitaitons, heartburn, abdominal pain, melena, lower extremity edema, claudication, or rash.  All other systems reviewed and negative  General: Affect appropriate Healthy:  appears stated age 35: normal Neck supple with no adenopathy JVP normal no bruits no thyromegaly Lungs clear with no wheezing and good diaphragmatic motion Heart:  S1/S2 no murmur, no rub, gallop or click PMI normal Abdomen: benighn, BS positve, no tenderness, no AAA no bruit.  No HSM or HJR Distal pulses intact with no bruits No edema Neuro non-focal Skin warm and  dry No muscular weakness   Current Outpatient Prescriptions  Medication Sig Dispense Refill  . alendronate (FOSAMAX) 70 MG tablet Take 1 tablet (70 mg total) by mouth every 7 (seven) days. Take with a full glass of water on an empty stomach. 4 tablet 11  . aspirin 81 MG tablet Take 81 mg by mouth daily.    . Calcium Carbonate Antacid (TUMS PO) Take 1 tablet by mouth 2 (two) times daily as needed (heartburn). For calcium    . cholecalciferol (VITAMIN D) 1000 UNITS tablet Take 1,000 Units by mouth every morning.      . dronedarone (MULTAQ) 400 MG tablet TAKE ONE (1) TABLET BY MOUTH TWO (2) TIMES DAILY WITH A MEAL 60 tablet 8  . ELIQUIS 5 MG TABS tablet TAKE ONE TABLET TWICE DAILY 60 tablet 3  . folic acid (FOLVITE) 1 MG tablet Take 1 tablet (1 mg total) by mouth daily. 30 tablet 8  . Multiple Vitamin (MULITIVITAMIN WITH MINERALS) TABS Take 1 tablet by mouth daily.      . nitroGLYCERIN (NITROSTAT) 0.4 MG SL tablet Place 0.4 mg under the tongue every 5 (five) minutes as needed for chest pain (3 doses max). Reported on 01/14/2016    . rosuvastatin (CRESTOR) 10 MG tablet Take 1 tablet (10 mg total) by mouth daily. 30 tablet 8   No current facility-administered medications for this visit.     Allergies  Remeron [mirtazapine]  Electrocardiogram:  4/15  SB rate 45  ICRBBB  Toprol stopped  Assessment and Plan CAD/CABG:  2011  Stable no angina continue medical Rx 09/08/15 myovue non ischemic  PAF: Maint NSR no palpitaitons continue multaq and eliquis  GERD continue protonix improved Chol  Cholesterol is at goal.  Continue current dose of statin and diet Rx.  No myalgias or side effects.  F/U  LFT's in 6 months. Lab Results  Component Value Date   LDLCALC 18 08/31/2015            Anemia:  Improved on folate  Lab Results  Component Value Date   HCT 42.0 06/30/2016   Osteoporosis:  Active on fosamax f/u primary   Jenkins Rouge

## 2016-09-11 ENCOUNTER — Encounter: Payer: Self-pay | Admitting: Cardiovascular Disease

## 2016-09-11 ENCOUNTER — Ambulatory Visit (INDEPENDENT_AMBULATORY_CARE_PROVIDER_SITE_OTHER): Payer: Medicare Other | Admitting: Cardiovascular Disease

## 2016-09-11 VITALS — BP 122/60 | HR 57 | Ht 74.0 in | Wt 156.0 lb

## 2016-09-11 DIAGNOSIS — I2510 Atherosclerotic heart disease of native coronary artery without angina pectoris: Secondary | ICD-10-CM | POA: Diagnosis not present

## 2016-09-11 NOTE — Patient Instructions (Addendum)

## 2016-09-19 ENCOUNTER — Other Ambulatory Visit: Payer: Self-pay | Admitting: Internal Medicine

## 2016-09-25 ENCOUNTER — Ambulatory Visit: Payer: Medicare Other | Admitting: Internal Medicine

## 2016-09-27 ENCOUNTER — Other Ambulatory Visit: Payer: Self-pay | Admitting: Cardiovascular Disease

## 2016-10-11 ENCOUNTER — Ambulatory Visit (INDEPENDENT_AMBULATORY_CARE_PROVIDER_SITE_OTHER): Payer: Medicare Other | Admitting: Medical

## 2016-10-11 ENCOUNTER — Encounter: Payer: Self-pay | Admitting: Medical

## 2016-10-11 ENCOUNTER — Telehealth: Payer: Self-pay | Admitting: *Deleted

## 2016-10-11 ENCOUNTER — Ambulatory Visit (HOSPITAL_BASED_OUTPATIENT_CLINIC_OR_DEPARTMENT_OTHER)
Admission: RE | Admit: 2016-10-11 | Discharge: 2016-10-11 | Disposition: A | Discharge status: Home / Self Care | Payer: Medicare Other | Source: Ambulatory Visit | Attending: Medical | Admitting: Medical

## 2016-10-11 VITALS — BP 118/68 | HR 64 | Ht 72.0 in | Wt 159.4 lb

## 2016-10-11 DIAGNOSIS — L853 Xerosis cutis: Secondary | ICD-10-CM | POA: Diagnosis not present

## 2016-10-11 DIAGNOSIS — M79661 Pain in right lower leg: Secondary | ICD-10-CM | POA: Insufficient documentation

## 2016-10-11 DIAGNOSIS — Z85828 Personal history of other malignant neoplasm of skin: Secondary | ICD-10-CM | POA: Diagnosis not present

## 2016-10-11 DIAGNOSIS — L918 Other hypertrophic disorders of the skin: Secondary | ICD-10-CM | POA: Diagnosis not present

## 2016-10-11 DIAGNOSIS — I809 Phlebitis and thrombophlebitis of unspecified site: Secondary | ICD-10-CM | POA: Diagnosis not present

## 2016-10-11 DIAGNOSIS — L570 Actinic keratosis: Secondary | ICD-10-CM | POA: Diagnosis not present

## 2016-10-11 DIAGNOSIS — M7661 Achilles tendinitis, right leg: Secondary | ICD-10-CM | POA: Diagnosis not present

## 2016-10-11 DIAGNOSIS — L821 Other seborrheic keratosis: Secondary | ICD-10-CM | POA: Diagnosis not present

## 2016-10-11 NOTE — Patient Instructions (Addendum)
For your calf pain will recommend warm compresses twice daily and low dose ibuprofen 200 mg   up to every 8  hours(for pain and inflammation).(max 4 day use)  I do think getting it would be beneficial to get Rt lower ext [place] today.  Follow up 5 days or as needed

## 2016-10-11 NOTE — Progress Notes (Signed)
Pre visit review using our clinic review tool, if applicable. No additional management support is needed unless otherwise documented below in the visit note. 

## 2016-10-11 NOTE — Telephone Encounter (Signed)
Pt called in stating that about 1 week ago he was out in the yard picking up pecans when he developed R leg pain while bending over 'like I pulled a little muscle' but the pain has not gone away. It is worse at night, he is currently experiencing very little pain. Pain is localized to R lower leg, starting at the outer aspect of the ankle and going up the outer calf muscle. He has some mild swelling in the leg and some warmth. No redness, rash, or fever. Pt scheduled for acute visit w/ Mackie Pai, PA-C at 3pm today. Declined sooner appt as he has a dermatology appt in [place] this morning.

## 2016-10-11 NOTE — Progress Notes (Signed)
Subjective:    Patient ID: [name], male    DOB: 08-04-1930, 82 y.o.   MRN: [id number]  HPI   Pt in states 2 weeks ago he was picking up some pecans. Initially had some  rtachilles tendon area pain that lasted for about 1 wk. Pain was dull and hurt when he was walking down stairs. But achilles pain stopped. Then over last 4 days he has rt lateral aspect calf. Lateral calf hurts when he first starts to walk. When he goes down steps has pain.  Today pain is less than last 2 day.  Review of Systems  Constitutional: Negative for chills, fatigue and fever.  Respiratory: Negative for cough, chest tightness, shortness of breath and wheezing.   Cardiovascular: Negative for chest pain and palpitations.  Gastrointestinal: Negative for abdominal pain.  Musculoskeletal:       See hpi.  Skin: Negative for rash.  Hematological: Negative for adenopathy. Does not bruise/bleed easily.   Past Medical History:  Diagnosis Date  . Atrial fibrillation ([place])   . CAD    a. 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  . Diverticulosis 2011   Diverticulitis 2004  . Esophageal reflux   . Headache    saw neuro 4-16, MRI done  . HYPERPLASIA, PROSTATE NOS W/URINARY OBST/LUTS   . IBS   . INGUINAL HERNIA   . Lichen planus [id number]   [name]  . LUMBAR RADICULOPATHY, RIGHT   . Mixed hyperlipidemia   . OSTEOPOROSIS   . PONV (postoperative nausea and vomiting)   . PREMATURE VENTRICULAR CONTRACTIONS    a. After CABG - was on Coumadin/Multaq for a period of time. Coumadin discontinued 04/2010 after maintaining NSR.  [name] UNSPECIFIED ANEMIA      Social History   Social History  . Marital status: Widowed    Spouse name: N/A  . Number of children: 0  . Years of education: N/A   Occupational History  . retired Retired   Social History Main Topics  . Smoking status: Former Smoker    Types: Cigarettes    Quit date: 11/27/1962    . Smokeless tobacco: Never Used  . Alcohol use 0.0 oz/week     Comment:  no wine recently  . Drug use: No  . Sexual activity: No   Other Topics Concern  . Not on file   Social History Narrative   Lives at [facility] in independent living.     Lost wife 06-2016.     Has no children.  Family: nephews-nices in [place]   Retired from [employer].     Still drives.     Past Surgical History:  Procedure Laterality Date  . CATARACT EXTRACTION Right 10/06/2013  . CORONARY ARTERY BYPASS GRAFT     2 VD;anomalous RCA;post op compliacted by AF  . HIP FRACTURE SURGERY Right 03/2007   Dr. Salvadore Farber  . INGUINAL HERNIA REPAIR Left 2012   Dr Brantley Stage  . KNEE SURGERY Right 1977   repair  . PROSTATE BIOPSY  09/19/2013   [facility]    Family History  Problem Relation Age of Onset  . Stroke Mother   . Pancreatic cancer Father     Deceased, 60  . Breast cancer Sister     Deceased  . Heart disease Sister 28  . Colon cancer Neg Hx   . Prostate cancer Neg Hx  Allergies  Allergen Reactions  . Remeron [Mirtazapine] Other (See Comments)    Excessive Drowsiness    Current Outpatient Prescriptions on File Prior to Visit  Medication Sig Dispense Refill  . alendronate (FOSAMAX) 70 MG tablet Take 1 tablet (70 mg total) by mouth once a week. Take with a full glass of water of an empty stomach. 12 tablet 3  . aspirin 81 MG tablet Take 81 mg by mouth daily.    . Calcium Carbonate Antacid (TUMS PO) Take 1 tablet by mouth 2 (two) times daily as needed (heartburn). For calcium    . cholecalciferol (VITAMIN D) 1000 UNITS tablet Take 1,000 Units by mouth every morning.      . dronedarone (MULTAQ) 400 MG tablet TAKE ONE (1) TABLET BY MOUTH TWO (2) TIMES DAILY WITH A MEAL 60 tablet 8  . ELIQUIS 5 MG TABS tablet TAKE ONE TABLET TWICE DAILY 60 tablet 3  . folic acid (FOLVITE) 1 MG tablet Take 1 tablet (1 mg total) by mouth daily. 30 tablet 11  . Multiple Vitamin (MULITIVITAMIN WITH  MINERALS) TABS Take 1 tablet by mouth daily.      . nitroGLYCERIN (NITROSTAT) 0.4 MG SL tablet Place 0.4 mg under the tongue every 5 (five) minutes as needed for chest pain (3 doses max). Reported on 01/14/2016    . rosuvastatin (CRESTOR) 10 MG tablet Take 1 tablet (10 mg total) by mouth daily. 30 tablet 8   No current facility-administered medications on file prior to visit.     BP 118/68 (BP Location: Left Arm, Patient Position: Sitting, Cuff Size: Normal)   Pulse 64   Ht 6' (1.829 m)   Wt 159 lb 6.4 oz (72.3 kg)   SpO2 98%   BMI 21.62 kg/m       Objective:   Physical Exam  General- no acute distress.  Lungs- cta. Heart-RRR  Rt ankle- Achilles no pain on palpation today.But some swelling. Rt lateral calf- mild pain on palpation lateral aspect from ankle to mid lateral calf. On palpation may have have superficial inflammed vein.  Negative homans sign. Calf may be minimaly enlarged compared to left side. No warmth to skin of calf.     Assessment & Plan:  For your calf pain will recommend warm compresses twice daily and  low dose ibuprofen 200 mg   up to every 8  hours  (for pain and inflammation).(max 4 day use)  I do think getting it would be beneficial to get Rt lower ext [place] today.  Follow up 5 days or as needed  Benefit vs risk of tx discussed.

## 2016-10-13 ENCOUNTER — Telehealth: Payer: Self-pay | Admitting: Medical

## 2016-10-13 ENCOUNTER — Telehealth: Payer: Self-pay | Admitting: Internal Medicine

## 2016-10-13 NOTE — Telephone Encounter (Signed)
I called pt close to 9 pm on Friday. Resulted note earlier in the day but pt was not called. So I advised him on [place] report as explained on result note attached to imaging study report.

## 2016-10-13 NOTE — Telephone Encounter (Signed)
Pt seen by Percell Miller, forwarding.

## 2016-10-13 NOTE — Telephone Encounter (Signed)
Pt called in for his x-ray results    CB: [phone]

## 2016-10-15 ENCOUNTER — Telehealth: Payer: Self-pay | Admitting: Medical

## 2016-10-15 NOTE — Telephone Encounter (Signed)
Talked with pt on Saturday am. Since he states felt better. Advised no more ibuprofen. Can use tylenol. Pt expressed understanding.

## 2016-10-16 NOTE — Progress Notes (Signed)
Letter sent to patient with his [place] results. Patient was called with no answer. PC

## 2016-10-18 NOTE — Addendum Note (Signed)
Addended by: Tasia Catchings on: 10/18/2016 09:09 AM   Modules accepted: Orders

## 2016-10-23 ENCOUNTER — Telehealth: Payer: Self-pay | Admitting: Internal Medicine

## 2016-10-23 NOTE — Telephone Encounter (Signed)
Patient does not need to come in for a follow up if he is feeling better.

## 2016-10-23 NOTE — Telephone Encounter (Signed)
Pt called in stating he received letter to f/u in 10 days. Pt doesn't feel it necessary. Not having pain/swelling behind knee. He has seen ortho before and will calll them if needed.

## 2016-10-30 ENCOUNTER — Other Ambulatory Visit: Payer: Self-pay | Admitting: Cardiovascular Disease

## 2016-11-28 ENCOUNTER — Other Ambulatory Visit: Payer: Self-pay | Admitting: Cardiovascular Disease

## 2016-12-11 ENCOUNTER — Ambulatory Visit (INDEPENDENT_AMBULATORY_CARE_PROVIDER_SITE_OTHER): Payer: Medicare Other | Admitting: Internal Medicine

## 2016-12-11 ENCOUNTER — Encounter: Payer: Self-pay | Admitting: Internal Medicine

## 2016-12-11 VITALS — BP 122/74 | HR 57 | Temp 97.9°F | Resp 14 | Ht 72.0 in | Wt 159.0 lb

## 2016-12-11 DIAGNOSIS — E782 Mixed hyperlipidemia: Secondary | ICD-10-CM

## 2016-12-11 DIAGNOSIS — I25119 Atherosclerotic heart disease of native coronary artery with unspecified angina pectoris: Secondary | ICD-10-CM | POA: Diagnosis not present

## 2016-12-11 DIAGNOSIS — R739 Hyperglycemia, unspecified: Secondary | ICD-10-CM | POA: Diagnosis not present

## 2016-12-11 DIAGNOSIS — E875 Hyperkalemia: Secondary | ICD-10-CM

## 2016-12-11 LAB — LIPID PANEL
CHOLESTEROL: 99 mg/dL (ref 0–200)
HDL: 55.4 mg/dL (ref >39.00)
LDL CALC: 30 mg/dL (ref 0–99)
NonHDL: 43.19
TRIGLYCERIDES: 68 mg/dL (ref 0.0–149.0)
Total CHOL/HDL Ratio: 2
VLDL: 13.6 mg/dL (ref 0.0–40.0)

## 2016-12-11 LAB — BASIC METABOLIC PANEL
BUN: 21 mg/dL (ref 6–23)
CO2: 31 meq/L (ref 19–32)
Calcium: 10.1 mg/dL (ref 8.4–10.5)
Chloride: 102 mEq/L (ref 96–112)
Creatinine, Ser: 0.94 mg/dL (ref 0.40–1.50)
GFR: 81.22 mL/min (ref >60.00)
Glucose, Bld: 77 mg/dL (ref 70–99)
POTASSIUM: 5.4 meq/L — AB (ref 3.5–5.1)
Sodium: 137 mEq/L (ref 135–145)

## 2016-12-11 LAB — AST: AST: 21 U/L (ref 0–37)

## 2016-12-11 LAB — HEMOGLOBIN A1C: Hgb A1c MFr Bld: 5.9 % (ref 4.6–6.5)

## 2016-12-11 LAB — ALT: ALT: 17 U/L (ref 0–53)

## 2016-12-11 NOTE — Progress Notes (Signed)
Pre visit review using our clinic review tool, if applicable. No additional management support is needed unless otherwise documented below in the visit note. 

## 2016-12-11 NOTE — Patient Instructions (Signed)
GO TO THE LAB : Get the blood work     GO TO THE FRONT DESK Schedule your next appointment for a  physical exam and Medicare wellness with my nurse in 4-5 months   Please call sooner if questions or concerns

## 2016-12-11 NOTE — Progress Notes (Signed)
Subjective:    Patient ID: [name], male    DOB: 1930-05-04, 83 y.o.   MRN: [id number]  DOS:  12/11/2016 Type of visit - description : Routine checkup Interval history: Since the last time he is feeling well. Appetite is normal. Weight is stable, feels well emotionally, he is very active. Still independent and drives.  Wt Readings from Last 3 Encounters:  12/11/16 159 lb (72.1 kg)  10/11/16 159 lb 6.4 oz (72.3 kg)  09/11/16 156 lb (70.8 kg)     Review of Systems Denies chest pain or difficulty breathing No nausea, vomiting, diarrhea  Past Medical History:  Diagnosis Date  . Atrial fibrillation ([place])   . CAD    a. 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  . Diverticulosis 2011   Diverticulitis 2004  . Esophageal reflux   . Headache    saw neuro 4-16, MRI done  . HYPERPLASIA, PROSTATE NOS W/URINARY OBST/LUTS   . IBS   . INGUINAL HERNIA   . Lichen planus [id number]   [name]  . LUMBAR RADICULOPATHY, RIGHT   . Mixed hyperlipidemia   . OSTEOPOROSIS   . PONV (postoperative nausea and vomiting)   . PREMATURE VENTRICULAR CONTRACTIONS    a. After CABG - was on Coumadin/Multaq for a period of time. Coumadin discontinued 04/2010 after maintaining NSR.  [name] UNSPECIFIED ANEMIA     Past Surgical History:  Procedure Laterality Date  . CATARACT EXTRACTION Right 10/06/2013  . CORONARY ARTERY BYPASS GRAFT     2 VD;anomalous RCA;post op compliacted by AF  . HIP FRACTURE SURGERY Right 03/2007   Dr. Salvadore Farber  . INGUINAL HERNIA REPAIR Left 2012   Dr Brantley Stage  . KNEE SURGERY Right 1977   repair  . PROSTATE BIOPSY  09/19/2013   [facility]    Social History   Social History  . Marital status: Widowed    Spouse name: N/A  . Number of children: 0  . Years of education: N/A   Occupational History  . retired Retired   Social History Main Topics  . Smoking status: Former Smoker    Types:  Cigarettes    Quit date: 11/27/1962  . Smokeless tobacco: Never Used  . Alcohol use 0.0 oz/week     Comment:  no wine recently  . Drug use: No  . Sexual activity: No   Other Topics Concern  . Not on file   Social History Narrative   Lives at [facility] in independent living.     Lost wife 06-2016.     Has no children.  Family: nephews-nices in [place]   Retired from [employer].     Still drives.       Allergies as of 12/11/2016      Reactions   Remeron [mirtazapine] Other (See Comments)   Excessive Drowsiness      Medication List       Accurate as of 12/11/16 11:59 PM. Always use your most recent med list.          alendronate 70 MG tablet Commonly known as:  FOSAMAX Take 1 tablet (70 mg total) by mouth once a week. Take with a full glass of water of an empty stomach.   apixaban 5 MG Tabs tablet Commonly known as:  ELIQUIS Take 1 tablet (5 mg total) by mouth 2 (two) times daily.   aspirin 81 MG tablet Take 81  mg by mouth daily.   cholecalciferol 1000 units tablet Commonly known as:  VITAMIN D Take 1,000 Units by mouth every morning.   dronedarone 400 MG tablet Commonly known as:  MULTAQ TAKE ONE (1) TABLET BY MOUTH TWO (2) TIMES DAILY WITH A MEAL   folic acid 1 MG tablet Commonly known as:  FOLVITE Take 1 tablet (1 mg total) by mouth daily.   multivitamin with minerals Tabs tablet Take 1 tablet by mouth daily.   nitroGLYCERIN 0.4 MG SL tablet Commonly known as:  NITROSTAT Place 0.4 mg under the tongue every 5 (five) minutes as needed for chest pain (3 doses max). Reported on 01/14/2016   rosuvastatin 10 MG tablet Commonly known as:  CRESTOR TAKE ONE (1) TABLET EACH DAY   TUMS PO Take 1 tablet by mouth 2 (two) times daily as needed (heartburn). For calcium          Objective:   Physical Exam BP 122/74 (BP Location: Left Arm, Patient Position: Sitting, Cuff Size: Small)   Pulse (!) 57   Temp 97.9 F (36.6 C) (Oral)   Resp 14   Ht 6'  (1.829 m)   Wt 159 lb (72.1 kg)   SpO2 93%   BMI 21.56 kg/m  General:   Well developed, well nourished . NAD.  HEENT:  Normocephalic . Face symmetric, atraumatic Lungs:  CTA B Normal respiratory effort, no intercostal retractions, no accessory muscle use. Heart: RRR,  no murmur.  No pretibial edema bilaterally  Skin: Not pale. Not jaundice Neurologic:  alert & oriented X3.  Speech normal, gait appropriate for age and unassisted Psych--  Cognition and judgment appear intact.  Cooperative with normal attention span and concentration.  Behavior appropriate. No anxious or depressed appearing.      Assessment & Plan:   Assessment>> Prediabetes, A1c 6.0 (2015) Hyperlipidemia Anxiety, depression: intol to zoloft, Rx lexapro 5 mg 07-2015: intolerant d/t nausea  CV: ---CAD--CABG 2011 ----P- Atrial fibrillation, PVCs. --Multaq, Elliquis  ----Palpable Ao x years, [place] (-) for AAA 2005 COPD (per CXR 06-2015), PFTs 10-04-15 mild obstruction DJD  Gait-- uses a walker prn GI: GERD, IBS, diverticulosis GU: elevated PSA, BPH , (-) bx 2014 Osteoporosis: dexa ~2005 --> rx fosamax, took x 1 year, dexa ~2010 was rec no fosamax at that time. Dexa 09-2015: T score -2.4 --> rx fosamax Lichen planus Headache -- saw neurology 02-2015, had a MRI/MRA (-)  PLAN Prediabetes: Diet control, check A1c Hyperlipidemia: On Crestor, checking a FLP, AST, ALT CAD: Asx. Check a BMP. Wt loss-  weight has stabilized Anxiety depression: Not an issue at this time RTC 4-5 months for a CPX Medicare wellness

## 2016-12-12 ENCOUNTER — Other Ambulatory Visit: Payer: Self-pay | Admitting: Cardiovascular Disease

## 2016-12-12 NOTE — Assessment & Plan Note (Signed)
Prediabetes: Diet control, check A1c Hyperlipidemia: On Crestor, checking a FLP, AST, ALT CAD: Asx. Check a BMP. Wt loss-  weight has stabilized Anxiety depression: Not an issue at this time RTC 4-5 months for a CPX Medicare wellness

## 2016-12-15 ENCOUNTER — Telehealth: Payer: Self-pay | Admitting: Internal Medicine

## 2016-12-15 NOTE — Addendum Note (Signed)
Addended byDamita Dunnings D on: 12/15/2016 10:19 AM   Modules accepted: Orders

## 2016-12-15 NOTE — Telephone Encounter (Signed)
BMP has been ordered

## 2016-12-15 NOTE — Telephone Encounter (Signed)
Relation to [id number] Call back number:[phone]   Reason for call:  Patient requesting lab orders, as per 12/11/16 lab results "Kaylyn, please mail the patient and low potassium diet and enter a future order for a BMP to be done in 3 weeks dx hyperkalemia" patient has a scheduled lab appointment only for 12/29/16.

## 2016-12-29 ENCOUNTER — Other Ambulatory Visit (INDEPENDENT_AMBULATORY_CARE_PROVIDER_SITE_OTHER): Payer: Medicare Other

## 2016-12-29 DIAGNOSIS — E875 Hyperkalemia: Secondary | ICD-10-CM

## 2016-12-29 LAB — BASIC METABOLIC PANEL
BUN: 20 mg/dL (ref 6–23)
CALCIUM: 10 mg/dL (ref 8.4–10.5)
CO2: 31 meq/L (ref 19–32)
CREATININE: 1.07 mg/dL (ref 0.40–1.50)
Chloride: 107 mEq/L (ref 96–112)
GFR: 69.94 mL/min (ref >60.00)
GLUCOSE: 93 mg/dL (ref 70–99)
Potassium: 5 mEq/L (ref 3.5–5.1)
SODIUM: 141 meq/L (ref 135–145)

## 2017-03-09 ENCOUNTER — Other Ambulatory Visit: Payer: Self-pay

## 2017-03-09 MED ORDER — LORATADINE 10 MG PO TABS: 10.0000 mg | ORAL_TABLET | Freq: Every day | ORAL | 11 refills | Status: DC

## 2017-05-21 ENCOUNTER — Encounter: Payer: Self-pay | Admitting: Internal Medicine

## 2017-05-21 ENCOUNTER — Ambulatory Visit: Payer: Medicare Other | Admitting: *Deleted

## 2017-05-21 ENCOUNTER — Ambulatory Visit (HOSPITAL_BASED_OUTPATIENT_CLINIC_OR_DEPARTMENT_OTHER)
Admission: RE | Admit: 2017-05-21 | Discharge: 2017-05-21 | Disposition: A | Discharge status: Home / Self Care | Payer: Medicare Other | Source: Ambulatory Visit | Attending: Internal Medicine | Admitting: Internal Medicine

## 2017-05-21 ENCOUNTER — Ambulatory Visit (INDEPENDENT_AMBULATORY_CARE_PROVIDER_SITE_OTHER): Payer: Medicare Other | Admitting: Internal Medicine

## 2017-05-21 VITALS — BP 122/60 | HR 59 | Temp 97.9°F | Resp 14 | Ht 72.0 in | Wt 159.0 lb

## 2017-05-21 DIAGNOSIS — R110 Nausea: Secondary | ICD-10-CM

## 2017-05-21 DIAGNOSIS — M47812 Spondylosis without myelopathy or radiculopathy, cervical region: Secondary | ICD-10-CM | POA: Insufficient documentation

## 2017-05-21 DIAGNOSIS — E875 Hyperkalemia: Secondary | ICD-10-CM | POA: Diagnosis not present

## 2017-05-21 DIAGNOSIS — M542 Cervicalgia: Secondary | ICD-10-CM | POA: Insufficient documentation

## 2017-05-21 DIAGNOSIS — M2578 Osteophyte, vertebrae: Secondary | ICD-10-CM | POA: Insufficient documentation

## 2017-05-21 LAB — COMPREHENSIVE METABOLIC PANEL
ALBUMIN: 4.2 g/dL (ref 3.5–5.2)
ALT: 16 U/L (ref 0–53)
AST: 22 U/L (ref 0–37)
Alkaline Phosphatase: 47 U/L (ref 39–117)
BUN: 22 mg/dL (ref 6–23)
CHLORIDE: 103 meq/L (ref 96–112)
CO2: 30 meq/L (ref 19–32)
CREATININE: 0.96 mg/dL (ref 0.40–1.50)
Calcium: 9.8 mg/dL (ref 8.4–10.5)
GFR: 79.19 mL/min (ref >60.00)
GLUCOSE: 97 mg/dL (ref 70–99)
POTASSIUM: 5.3 meq/L — AB (ref 3.5–5.1)
SODIUM: 137 meq/L (ref 135–145)
Total Bilirubin: 0.5 mg/dL (ref 0.2–1.2)
Total Protein: 7 g/dL (ref 6.0–8.3)

## 2017-05-21 LAB — CBC WITH DIFFERENTIAL/PLATELET
Basophils Absolute: 0.1 10*3/uL (ref 0.0–0.1)
Basophils Relative: 1.3 % (ref 0.0–3.0)
EOS ABS: 0.6 10*3/uL (ref 0.0–0.7)
EOS PCT: 6.9 % — AB (ref 0.0–5.0)
HCT: 40.5 % (ref 39.0–52.0)
Hemoglobin: 13.6 g/dL (ref 13.0–17.0)
LYMPHS ABS: 1.7 10*3/uL (ref 0.7–4.0)
Lymphocytes Relative: 19.4 % (ref 12.0–46.0)
MCHC: 33.5 g/dL (ref 30.0–36.0)
MCV: 94.3 fl (ref 78.0–100.0)
MONO ABS: 1 10*3/uL (ref 0.1–1.0)
Monocytes Relative: 11 % (ref 3.0–12.0)
NEUTROS ABS: 5.4 10*3/uL (ref 1.4–7.7)
Neutrophils Relative %: 61.4 % (ref 43.0–77.0)
PLATELETS: 187 10*3/uL (ref 150.0–400.0)
RBC: 4.29 Mil/uL (ref 4.22–5.81)
RDW: 15.5 % (ref 11.5–15.5)
WBC: 8.9 10*3/uL (ref 4.0–10.5)

## 2017-05-21 NOTE — Progress Notes (Signed)
Subjective:    Patient ID: [name], male    DOB: Feb 10, 1930, 83 y.o.   MRN: [id number]  DOS:  05/21/2017 Type of visit - description : rov Interval history: --Chronic medical problems: pertinent  labs and medications reviewed.  He also has a couple of other issues: --Few weeks history of exacerbation of chronic neck pain. It is located at the left posterior aspect, sometimes radiates down to the left shoulder but not to the upper extremities. Denies paresthesias per se. Is worse at night. Usually Tylenol helps however the last few times that he took Tylenol he developed the "jitters". --Also, for the last 2 weeks has occasionally experienced nausea after his breakfast. Denies vomiting, abdominal pain, heartburn, diarrhea, change in the color of the stools. No headaches. Not taking Motrin or similar medications.  Wt Readings from Last 3 Encounters:  05/21/17 159 lb (72.1 kg)  12/11/16 159 lb (72.1 kg)  10/11/16 159 lb 6.4 oz (72.3 kg)     Review of Systems See above. Emotionally doing okay.  Past Medical History:  Diagnosis Date  . Atrial fibrillation ([place])   . CAD    a. 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  . Diverticulosis 2011   Diverticulitis 2004  . Esophageal reflux   . Headache    saw neuro 4-16, MRI done  . HYPERPLASIA, PROSTATE NOS W/URINARY OBST/LUTS   . IBS   . INGUINAL HERNIA   . Lichen planus [id number]   [name]  . LUMBAR RADICULOPATHY, RIGHT   . Mixed hyperlipidemia   . OSTEOPOROSIS   . PONV (postoperative nausea and vomiting)   . PREMATURE VENTRICULAR CONTRACTIONS    a. After CABG - was on Coumadin/Multaq for a period of time. Coumadin discontinued 04/2010 after maintaining NSR.  [name] UNSPECIFIED ANEMIA     Past Surgical History:  Procedure Laterality Date  . CATARACT EXTRACTION Right 10/06/2013  . CORONARY ARTERY BYPASS GRAFT     2 VD;anomalous RCA;post op  compliacted by AF  . HIP FRACTURE SURGERY Right 03/2007   Dr. Salvadore Farber  . INGUINAL HERNIA REPAIR Left 2012   Dr Brantley Stage  . KNEE SURGERY Right 1977   repair  . PROSTATE BIOPSY  09/19/2013   [facility]    Social History   Social History  . Marital status: Widowed    Spouse name: N/A  . Number of children: 0  . Years of education: N/A   Occupational History  . retired Retired   Social History Main Topics  . Smoking status: Former Smoker    Types: Cigarettes    Quit date: 11/27/1962  . Smokeless tobacco: Never Used  . Alcohol use 0.0 oz/week     Comment:  no wine recently  . Drug use: No  . Sexual activity: No   Other Topics Concern  . Not on file   Social History Narrative   Lives at [facility] in independent living.     Lost wife 06-2016.     Has no children.  Family: nephews-nices in [place]   Retired from [employer].     Still drives.       Allergies as of 05/21/2017      Reactions   Remeron [mirtazapine] Other (See Comments)   Excessive Drowsiness      Medication List       Accurate as of 05/21/17  4:55 PM. Always use your  most recent med list.          alendronate 70 MG tablet Commonly known as:  FOSAMAX Take 1 tablet (70 mg total) by mouth once a week. Take with a full glass of water of an empty stomach.   apixaban 5 MG Tabs tablet Commonly known as:  ELIQUIS Take 1 tablet (5 mg total) by mouth 2 (two) times daily.   aspirin 81 MG tablet Take 81 mg by mouth daily.   cholecalciferol 1000 units tablet Commonly known as:  VITAMIN D Take 1,000 Units by mouth every morning.   folic acid 1 MG tablet Commonly known as:  FOLVITE Take 1 tablet (1 mg total) by mouth daily.   loratadine 10 MG tablet Commonly known as:  CLARITIN Take 1 tablet (10 mg total) by mouth daily.   MULTAQ 400 MG tablet Generic drug:  dronedarone TAKE ONE TABLET TWICE DAILY WITH A MEAL   multivitamin with minerals Tabs tablet Take 1 tablet by mouth daily.     nitroGLYCERIN 0.4 MG SL tablet Commonly known as:  NITROSTAT Place 0.4 mg under the tongue every 5 (five) minutes as needed for chest pain (3 doses max). Reported on 01/14/2016   rosuvastatin 10 MG tablet Commonly known as:  CRESTOR TAKE ONE (1) TABLET EACH DAY   TUMS PO Take 1 tablet by mouth 2 (two) times daily as needed (heartburn). For calcium          Objective:   Physical Exam BP 122/60 (BP Location: Left Arm, Patient Position: Sitting, Cuff Size: Small)   Pulse (!) 59   Temp 97.9 F (36.6 C) (Oral)   Resp 14   Ht 6' (1.829 m)   Wt 159 lb (72.1 kg)   SpO2 95%   BMI 21.56 kg/m  General:   Well developed, well nourished . NAD.  HEENT:  Normocephalic . Face symmetric, atraumatic Neck: Range of motion seem appropriate, no TTP and the cervical spine. Lungs:  CTA B Normal respiratory effort, no intercostal retractions, no accessory muscle use. Heart: RRR,  no murmur.  no pretibial edema bilaterally  Abdomen:  Not distended, soft, non-tender. No rebound or rigidity. palpable aorta, not new finding. Skin: Not pale. Not jaundice Neurologic:  alert & oriented X3.  Speech normal, gait appropriate for age and unassisted Psych--  Cognition and judgment appear intact.  Cooperative with normal attention span and concentration.  Behavior appropriate. No anxious or depressed appearing.    Assessment & Plan:   Assessment>> Prediabetes, A1c 6.0 (2015) Hyperlipidemia Anxiety, depression: intol to zoloft, Rx lexapro 5 mg 07-2015: intolerant d/t nausea  CV: ---CAD--CABG 2011 ----P- Atrial fibrillation, PVCs. --Multaq, Elliquis  ----Palpable Ao x years, [place] (-) for AAA 2005 COPD (per CXR 06-2015), PFTs 10-04-15 mild obstruction DJD  Gait-- uses a walker prn GI: GERD, IBS, diverticulosis GU: elevated PSA, BPH , (-) bx 2014 Osteoporosis: dexa ~2005 --> rx fosamax, took x 1 year, dexa ~2010 was rec no fosamax at that time. Dexa 09-2015: T score -2.4 --> rx fosamax Lichen  planus Headache -- saw neurology 02-2015, had a MRI/MRA (-)  PLAN Chronic medical problems : seem stable. Last A1c an FLP satisfactory Hyperkalemia, mild. Recheck a CMP Neck pain: Likely due to DJD, will get a x-ray. States that Tylenol gives him "the jitters" sometimes, rec a rechallenge w/ 1000 mg qhs;  if truly intolerant,  consider Ultram. ( In the past saw [facility], Dr. Rhona Raider). Nausea: As described above, check a CBC to be  sure he is not developing anemia, sxs started 2 weeks ago and they are not persistent so will recommend observation. RTC 2 weeks if not improving, 3 months for a CPX.

## 2017-05-21 NOTE — Progress Notes (Signed)
Pre visit review using our clinic review tool, if applicable. No additional management support is needed unless otherwise documented below in the visit note. 

## 2017-05-21 NOTE — Patient Instructions (Addendum)
GO TO THE LAB : Get the blood work     GO TO THE FRONT DESK Schedule your next appointment for a  physical exam and a Medicare wellness with one of our RNs in 3 months   Get an x-ray at the first floor.  For neck pain: Tylenol 500 mg: 2 tablets at bedtime Heating pad to the neck at night  If you are not gradually improving come back in 2 weeks

## 2017-05-21 NOTE — Assessment & Plan Note (Signed)
Chronic medical problems : seem stable. Last A1c an FLP satisfactory Hyperkalemia, mild. Recheck a CMP Neck pain: Likely due to DJD, will get a x-ray. States that Tylenol gives him "the jitters" sometimes, rec a rechallenge w/ 1000 mg qhs;  if truly intolerant,  consider Ultram. ( In the past saw [facility], Dr. Rhona Raider). Nausea: As described above, check a CBC to be sure he is not developing anemia, sxs started 2 weeks ago and they are not persistent so will recommend observation. RTC 2 weeks if not improving, 3 months for a CPX.

## 2017-05-25 ENCOUNTER — Telehealth: Payer: Self-pay | Admitting: Internal Medicine

## 2017-05-25 NOTE — Telephone Encounter (Signed)
Spoke w/ Pt, sending references regarding potassium in foods and hyperkalemia to MyChart.

## 2017-05-25 NOTE — Telephone Encounter (Signed)
Caller name:Kiley Niese Relationship to patient: Can be reached:[phone] Pharmacy:  Reason for call:Requesting call back, wants to know if you have any material you can send him on foods to avoid with his elevated potassium

## 2017-05-25 NOTE — Addendum Note (Signed)
Addended byDamita Dunnings D on: 05/25/2017 10:17 AM   Modules accepted: Orders

## 2017-06-05 ENCOUNTER — Ambulatory Visit (INDEPENDENT_AMBULATORY_CARE_PROVIDER_SITE_OTHER): Payer: Medicare Other | Admitting: Internal Medicine

## 2017-06-05 ENCOUNTER — Encounter: Payer: Self-pay | Admitting: Internal Medicine

## 2017-06-05 VITALS — BP 118/68 | HR 59 | Temp 97.9°F | Resp 14 | Ht 72.0 in | Wt 158.5 lb

## 2017-06-05 DIAGNOSIS — R110 Nausea: Secondary | ICD-10-CM | POA: Diagnosis not present

## 2017-06-05 MED ORDER — PANTOPRAZOLE SODIUM 40 MG PO TBEC: 40.0000 mg | DELAYED_RELEASE_TABLET | Freq: Every day | ORAL | 3 refills | Status: DC

## 2017-06-05 NOTE — Progress Notes (Signed)
Subjective:    Patient ID: [name], male    DOB: March 19, 1930, 83 y.o.   MRN: [id number]  DOS:  06/05/2017 Type of visit - description :  Acute visit Interval history: His main concern is nausea. Symptoms started a month ago, nauseous on-off,sx may last 2-3 hours or all day. Typically gets actually better after he eats. "food  sets well with me". Denies taking any new medications, no NSAIDs, no history of PUD. Symptoms seem to be random and not related with any specific food. Denies nocturnal symptoms  Wt Readings from Last 3 Encounters:  06/05/17 158 lb 8 oz (71.9 kg)  05/21/17 159 lb (72.1 kg)  12/11/16 159 lb (72.1 kg)     Review of Systems Denies fever, chills, weight loss. Appetite and by mouth tolerance is very good. Denies vomiting, diarrhea, blood in the stools, change in the color of the stools or abdominal pain. No heartburn. No headaches No UTI symptoms   Past Medical History:  Diagnosis Date  . Atrial fibrillation ([place])   . CAD    a. 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  . Diverticulosis 2011   Diverticulitis 2004  . Esophageal reflux   . Headache    saw neuro 4-16, MRI done  . HYPERPLASIA, PROSTATE NOS W/URINARY OBST/LUTS   . IBS   . INGUINAL HERNIA   . Lichen planus [id number]   [name]  . LUMBAR RADICULOPATHY, RIGHT   . Mixed hyperlipidemia   . OSTEOPOROSIS   . PONV (postoperative nausea and vomiting)   . PREMATURE VENTRICULAR CONTRACTIONS    a. After CABG - was on Coumadin/Multaq for a period of time. Coumadin discontinued 04/2010 after maintaining NSR.  [name] UNSPECIFIED ANEMIA     Past Surgical History:  Procedure Laterality Date  . CATARACT EXTRACTION Right 10/06/2013  . CORONARY ARTERY BYPASS GRAFT     2 VD;anomalous RCA;post op compliacted by AF  . HIP FRACTURE SURGERY Right 03/2007   Dr. Salvadore Farber  . INGUINAL HERNIA REPAIR Left 2012   Dr Brantley Stage  . KNEE  SURGERY Right 1977   repair  . PROSTATE BIOPSY  09/19/2013   [facility]    Social History   Social History  . Marital status: Widowed    Spouse name: N/A  . Number of children: 0  . Years of education: N/A   Occupational History  . retired Retired   Social History Main Topics  . Smoking status: Former Smoker    Types: Cigarettes    Quit date: 11/27/1962  . Smokeless tobacco: Never Used  . Alcohol use 0.0 oz/week     Comment:  no wine recently  . Drug use: No  . Sexual activity: No   Other Topics Concern  . Not on file   Social History Narrative   Lives at [facility] in independent living.     Lost wife 06-2016.     Has no children.  Family: nephews-nices in [place]   Retired from [employer].     Still drives.       Allergies as of 06/05/2017      Reactions   Remeron [mirtazapine] Other (See Comments)   Excessive Drowsiness      Medication List       Accurate as of 06/05/17 11:59 PM. Always use your most recent med list.          alendronate  70 MG tablet Commonly known as:  FOSAMAX Take 1 tablet (70 mg total) by mouth once a week. Take with a full glass of water of an empty stomach.   apixaban 5 MG Tabs tablet Commonly known as:  ELIQUIS Take 1 tablet (5 mg total) by mouth 2 (two) times daily.   aspirin 81 MG tablet Take 81 mg by mouth daily.   cholecalciferol 1000 units tablet Commonly known as:  VITAMIN D Take 1,000 Units by mouth every morning.   folic acid 1 MG tablet Commonly known as:  FOLVITE Take 1 tablet (1 mg total) by mouth daily.   loratadine 10 MG tablet Commonly known as:  CLARITIN Take 1 tablet (10 mg total) by mouth daily.   MULTAQ 400 MG tablet Generic drug:  dronedarone TAKE ONE TABLET TWICE DAILY WITH A MEAL   multivitamin with minerals Tabs tablet Take 1 tablet by mouth daily.   nitroGLYCERIN 0.4 MG SL tablet Commonly known as:  NITROSTAT Place 0.4 mg under the tongue every 5 (five) minutes as needed for  chest pain (3 doses max). Reported on 01/14/2016   pantoprazole 40 MG tablet Commonly known as:  PROTONIX Take 1 tablet (40 mg total) by mouth daily before breakfast.   rosuvastatin 10 MG tablet Commonly known as:  CRESTOR TAKE ONE (1) TABLET EACH DAY   TUMS PO Take 1 tablet by mouth 2 (two) times daily as needed (heartburn). For calcium          Objective:   Physical Exam BP 118/68 (BP Location: Left Arm, Patient Position: Sitting, Cuff Size: Small)   Pulse (!) 59   Temp 97.9 F (36.6 C) (Oral)   Resp 14   Ht 6' (1.829 m)   Wt 158 lb 8 oz (71.9 kg)   SpO2 97%   BMI 21.50 kg/m  General:   Well developed, well nourished . NAD.  HEENT:  Normocephalic . Face symmetric, atraumatic. Not pale Neck: No mass or LAD Lungs:  CTA B Normal respiratory effort, no intercostal retractions, no accessory muscle use. Heart: RRR,  no murmur.  no pretibial edema bilaterally  Abdomen:  Not distended, soft, non-tender to even deep palpation. No mass. No rebound or rigidity.  Skin: Not pale. Not jaundice Neurologic:  alert & oriented X3.  Speech normal, gait appropriate for age and unassisted Psych--  Cognition and judgment appear intact.  Cooperative with normal attention span and concentration.  Behavior appropriate. No anxious or depressed appearing.    Assessment & Plan:   Assessment>> Prediabetes, A1c 6.0 (2015) Hyperlipidemia Anxiety, depression: intol to zoloft, Rx lexapro 5 mg 07-2015: intolerant d/t nausea  CV: ---CAD--CABG 2011 ----P- Atrial fibrillation, PVCs. --Multaq, Elliquis  ----Palpable Ao x years, [place] (-) for AAA 2005 COPD (per CXR 06-2015), PFTs 10-04-15 mild obstruction DJD  Gait-- uses a walker prn GI: GERD, IBS, diverticulosis GU: elevated PSA, BPH , (-) bx 2014 Osteoporosis: dexa ~2005 --> rx fosamax, took x 1 year, dexa ~2010 was rec no fosamax at that time. Dexa 09-2015: T score -2.4 --> rx fosamax Lichen planus Headache -- saw neurology 02-2015, had a  MRI/MRA (-)  PLAN Nausea: Etiology unclear, no red flag symptoms, no headaches. Recent CBC and LFTs including alkaline phosphatase normal. No weight loss. No history of previous abdominal surgeries. The newest medication he is taking his Fosamax for a few months. DDX is large: Gastritis, PUD, gastroparesis, gallbladder stones, non-GI causes, others. Plan: Get a ultrasound, Protonix,  I FOB, call in 2  weeks, he is to let me know how he is doing. If no better will need further assessment.

## 2017-06-05 NOTE — Patient Instructions (Signed)
Take Protonix 1 tablet before breakfast every day for the next 2 months  Returned the stool card  We'll schedule ultrasound of the abdomen  Call me in 2 weeks and let me know how you're doing.  If you have severe symptoms, blood in the stools, fever chills, weight loss :  call immediately.

## 2017-06-05 NOTE — Progress Notes (Signed)
Pre visit review using our clinic review tool, if applicable. No additional management support is needed unless otherwise documented below in the visit note. 

## 2017-06-06 ENCOUNTER — Ambulatory Visit (HOSPITAL_BASED_OUTPATIENT_CLINIC_OR_DEPARTMENT_OTHER)
Admission: RE | Admit: 2017-06-06 | Discharge: 2017-06-06 | Disposition: A | Discharge status: Home / Self Care | Payer: Medicare Other | Source: Ambulatory Visit | Attending: Internal Medicine | Admitting: Internal Medicine

## 2017-06-06 DIAGNOSIS — R110 Nausea: Secondary | ICD-10-CM | POA: Insufficient documentation

## 2017-06-06 DIAGNOSIS — K828 Other specified diseases of gallbladder: Secondary | ICD-10-CM | POA: Diagnosis not present

## 2017-06-06 NOTE — Assessment & Plan Note (Signed)
Nausea: Etiology unclear, no red flag symptoms, no headaches. Recent CBC and LFTs including alkaline phosphatase normal. No weight loss. No history of previous abdominal surgeries. The newest medication he is taking his Fosamax for a few months. DDX is large: Gastritis, PUD, gastroparesis, gallbladder stones, non-GI causes, others. Plan: Get a ultrasound, Protonix,  I FOB, call in 2 weeks, he is to let me know how he is doing. If no better will need further assessment.

## 2017-06-18 ENCOUNTER — Other Ambulatory Visit (INDEPENDENT_AMBULATORY_CARE_PROVIDER_SITE_OTHER): Payer: Medicare Other

## 2017-06-18 DIAGNOSIS — R110 Nausea: Secondary | ICD-10-CM

## 2017-06-18 LAB — FECAL OCCULT BLOOD, IMMUNOCHEMICAL: FECAL OCCULT BLD: NEGATIVE

## 2017-06-20 ENCOUNTER — Telehealth: Payer: Self-pay | Admitting: Internal Medicine

## 2017-06-20 NOTE — Telephone Encounter (Signed)
FYI

## 2017-06-20 NOTE — Telephone Encounter (Signed)
thx

## 2017-06-20 NOTE — Telephone Encounter (Signed)
Pt called stating he hasn't been sick since he saw you. Just wanted to let you know he is feeling fine.  Caller name: Estuardo Frisbee Relationship to patient: self Can be reached: [phone]

## 2017-07-09 ENCOUNTER — Other Ambulatory Visit: Payer: Self-pay

## 2017-07-09 MED ORDER — DRONEDARONE HCL 400 MG PO TABS: ORAL_TABLET | ORAL | 4 refills | Status: DC

## 2017-07-10 ENCOUNTER — Other Ambulatory Visit (INDEPENDENT_AMBULATORY_CARE_PROVIDER_SITE_OTHER): Payer: Medicare Other

## 2017-07-10 DIAGNOSIS — E875 Hyperkalemia: Secondary | ICD-10-CM | POA: Diagnosis not present

## 2017-07-10 LAB — BASIC METABOLIC PANEL
BUN: 20 mg/dL (ref 6–23)
CALCIUM: 9.6 mg/dL (ref 8.4–10.5)
CHLORIDE: 102 meq/L (ref 96–112)
CO2: 30 meq/L (ref 19–32)
Creatinine, Ser: 0.95 mg/dL (ref 0.40–1.50)
GFR: 80.13 mL/min (ref >60.00)
GLUCOSE: 88 mg/dL (ref 70–99)
Potassium: 4.8 mEq/L (ref 3.5–5.1)
Sodium: 137 mEq/L (ref 135–145)

## 2017-07-20 ENCOUNTER — Ambulatory Visit (INDEPENDENT_AMBULATORY_CARE_PROVIDER_SITE_OTHER): Payer: Medicare Other | Admitting: Family Medicine

## 2017-07-20 ENCOUNTER — Encounter: Payer: Self-pay | Admitting: Family Medicine

## 2017-07-20 VITALS — BP 132/78 | HR 72 | Temp 98.2°F | Ht 74.0 in | Wt 157.0 lb

## 2017-07-20 DIAGNOSIS — R6884 Jaw pain: Secondary | ICD-10-CM

## 2017-07-20 NOTE — Progress Notes (Signed)
Pre visit review using our clinic review tool, if applicable. No additional management support is needed unless otherwise documented below in the visit note. 

## 2017-07-20 NOTE — Progress Notes (Signed)
Chief Complaint  Patient presents with  . Jaw Pain    Subjective: Patient is a 83 y.o. male here for jaw pain.  Pt is on Fosamax and has noticed jaw pain around 2-3 weeks ago. He had it 2 days in a row yesterday and today. He has been on bisphosphonate for almost 2 years. He has a repeat DEXA scan coming up in October. He last took it 3 days ago. There is no injury, change in diet, or history of TMJ. He denies any ear pain. There is no dental pain or drainage in his mouth.   ROS: MSK: As noted in HPI  Family History  Problem Relation Age of Onset  . Stroke Mother   . Pancreatic cancer Father        Deceased, 74  . Breast cancer Sister        Deceased  . Heart disease Sister 61  . Colon cancer Neg Hx   . Prostate cancer Neg Hx    Past Medical History:  Diagnosis Date  . Atrial fibrillation ([place])   . CAD    a. 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  . Diverticulosis 2011   Diverticulitis 2004  . Esophageal reflux   . Headache    saw neuro 4-16, MRI done  . HYPERPLASIA, PROSTATE NOS W/URINARY OBST/LUTS   . IBS   . INGUINAL HERNIA   . Lichen planus [id number]   [name]  . LUMBAR RADICULOPATHY, RIGHT   . Mixed hyperlipidemia   . OSTEOPOROSIS   . PONV (postoperative nausea and vomiting)   . PREMATURE VENTRICULAR CONTRACTIONS    a. After CABG - was on Coumadin/Multaq for a period of time. Coumadin discontinued 04/2010 after maintaining NSR.  [name] UNSPECIFIED ANEMIA    Allergies  Allergen Reactions  . Remeron [Mirtazapine] Other (See Comments)    Excessive Drowsiness    Current Outpatient Prescriptions:  .  alendronate (FOSAMAX) 70 MG tablet, Take 1 tablet (70 mg total) by mouth once a week. Take with a full glass of water of an empty stomach., Disp: 12 tablet, Rfl: 3 .  apixaban (ELIQUIS) 5 MG TABS tablet, Take 1 tablet (5 mg total) by mouth 2 (two) times daily., Disp: 60 tablet, Rfl: 9 .   aspirin 81 MG tablet, Take 81 mg by mouth daily., Disp: , Rfl:  .  Calcium Carbonate Antacid (TUMS PO), Take 1 tablet by mouth 2 (two) times daily as needed (heartburn). For calcium, Disp: , Rfl:  .  cholecalciferol (VITAMIN D) 1000 UNITS tablet, Take 1,000 Units by mouth every morning.  , Disp: , Rfl:  .  dronedarone (MULTAQ) 400 MG tablet, TAKE ONE TABLET TWICE DAILY WITH A MEAL, Disp: 60 tablet, Rfl: 4 .  folic acid (FOLVITE) 1 MG tablet, Take 1 tablet (1 mg total) by mouth daily., Disp: 30 tablet, Rfl: 11 .  loratadine (CLARITIN) 10 MG tablet, Take 1 tablet (10 mg total) by mouth daily., Disp: 30 tablet, Rfl: 11 .  Multiple Vitamin (MULITIVITAMIN WITH MINERALS) TABS, Take 1 tablet by mouth daily.  , Disp: , Rfl:  .  nitroGLYCERIN (NITROSTAT) 0.4 MG SL tablet, Place 0.4 mg under the tongue every 5 (five) minutes as needed for chest pain (3 doses max). Reported on 01/14/2016, Disp: , Rfl:  .  pantoprazole (PROTONIX) 40 MG tablet, Take 1 tablet (40 mg total) by mouth daily before breakfast., Disp: 30  tablet, Rfl: 3 .  rosuvastatin (CRESTOR) 10 MG tablet, TAKE ONE (1) TABLET EACH DAY, Disp: 90 tablet, Rfl: 3  Objective: BP 132/78 (BP Location: Left Arm, Patient Position: Sitting, Cuff Size: Normal)   Pulse 72   Temp 98.2 F (36.8 C) (Oral)   Ht 6\' 2"  (1.88 m)   Wt 157 lb (71.2 kg)   SpO2 96%   BMI 20.16 kg/m  General: Awake, appears stated age HEENT: MMM, no dental decay noted or gingival inflammation; ears neg b/l with exception of cerumen on the R MSK: No TTP over TMJ or mandible, no clicking or deviation with motion of TMJ Skin: No erythema or excessive warmth over R jaw Lungs: No accessory muscle use Psych: Age appropriate judgment and insight, normal affect and mood  Assessment and Plan: Jaw pain  Stop Fosamax for next 3-4 weeks, call and let us know how it is. If pain resolved, stay off. If still having, will restart and have him follow up with Dr. Larose Kells The patient voiced  understanding and agreement to the plan.  [place], DO 07/20/17  4:09 PM

## 2017-07-20 NOTE — Patient Instructions (Signed)
Stop Fosamax for now. Give [place] a call in 4 weeks and let us know how your jaw pain is doing. Further instructions to follow.

## 2017-08-09 DIAGNOSIS — H2512 Age-related nuclear cataract, left eye: Secondary | ICD-10-CM | POA: Diagnosis not present

## 2017-08-09 DIAGNOSIS — Z961 Presence of intraocular lens: Secondary | ICD-10-CM | POA: Diagnosis not present

## 2017-08-09 DIAGNOSIS — H26491 Other secondary cataract, right eye: Secondary | ICD-10-CM | POA: Diagnosis not present

## 2017-08-09 DIAGNOSIS — H04123 Dry eye syndrome of bilateral lacrimal glands: Secondary | ICD-10-CM | POA: Diagnosis not present

## 2017-08-17 NOTE — Progress Notes (Signed)
Subjective:   [name] is a 83 y.o. male who presents for Medicare Annual/Subsequent preventive examination.  Review of Systems:  No ROS.  Medicare Wellness Visit. Additional risk factors are reflected in the social history.  Male:   CCS- last 01/03/11: No longer doing routine screening due to age.  PSA-  Lab Results  Component Value Date   PSA 3.15 08/08/2013   PSA 1.53 01/10/2011   PSA 1.63 09/20/2007       Objective:    Vitals: BP 108/61 (BP Location: Left Arm, Patient Position: Sitting, Cuff Size: Normal)   Pulse 62   Ht 6\' 2"  (1.88 m)   Wt 158 lb 3.2 oz (71.8 kg)   SpO2 98%   BMI 20.31 kg/m   Body mass index is 20.31 kg/m.  Tobacco History  Smoking Status  . Former Smoker  . Types: Cigarettes  . Quit date: 11/27/1962  Smokeless Tobacco  . Never Used     Counseling given: Not Answered   Past Medical History:  Diagnosis Date  . Atrial fibrillation ([place])   . CAD    a. 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  . Diverticulosis 2011   Diverticulitis 2004  . Esophageal reflux   . Headache    saw neuro 4-16, MRI done  . HYPERPLASIA, PROSTATE NOS W/URINARY OBST/LUTS   . IBS   . INGUINAL HERNIA   . Lichen planus [id number]   [name]  . LUMBAR RADICULOPATHY, RIGHT   . Mixed hyperlipidemia   . OSTEOPOROSIS   . PONV (postoperative nausea and vomiting)   . PREMATURE VENTRICULAR CONTRACTIONS    a. After CABG - was on Coumadin/Multaq for a period of time. Coumadin discontinued 04/2010 after maintaining NSR.  [name] UNSPECIFIED ANEMIA    Past Surgical History:  Procedure Laterality Date  . CATARACT EXTRACTION Right 10/06/2013  . CORONARY ARTERY BYPASS GRAFT     2 VD;anomalous RCA;post op compliacted by AF  . HIP FRACTURE SURGERY Right 03/2007   Dr. Salvadore Farber  . INGUINAL HERNIA REPAIR Left 2012   Dr Brantley Stage  . KNEE SURGERY Right 1977   repair  . PROSTATE BIOPSY  09/19/2013   [facility]   Family History  Problem Relation Age of Onset  . Stroke Mother   . Pancreatic cancer Father        Deceased, 53  . Breast cancer Sister        Deceased  . Heart disease Sister 23  . Colon cancer Neg Hx   . Prostate cancer Neg Hx    History  Sexual Activity  . Sexual activity: No    Outpatient Encounter Prescriptions as of 08/21/2017  Medication Sig  . apixaban (ELIQUIS) 5 MG TABS tablet Take 1 tablet (5 mg total) by mouth 2 (two) times daily.  [name] aspirin 81 MG tablet Take 81 mg by mouth daily.  . Calcium Carbonate Antacid (TUMS PO) Take 1 tablet by mouth 2 (two) times daily as needed (heartburn). For calcium  . cholecalciferol (VITAMIN D) 1000 UNITS tablet Take 1,000 Units by mouth every morning.    . dronedarone (MULTAQ) 400 MG tablet TAKE ONE TABLET TWICE DAILY WITH A MEAL  . folic acid (FOLVITE) 1 MG tablet Take 1 tablet (1 mg total) by mouth daily.  [name] loratadine (CLARITIN) 10 MG tablet Take 1 tablet (10 mg total) by mouth daily.  . Multiple Vitamin (MULITIVITAMIN  WITH MINERALS) TABS Take 1 tablet by mouth daily.    . pantoprazole (PROTONIX) 40 MG tablet Take 1 tablet (40 mg total) by mouth daily before breakfast.  . rosuvastatin (CRESTOR) 10 MG tablet TAKE ONE (1) TABLET EACH DAY  . alendronate (FOSAMAX) 70 MG tablet Take 1 tablet (70 mg total) by mouth once a week. Take with a full glass of water of an empty stomach. (Patient not taking: Reported on 08/21/2017)  . nitroGLYCERIN (NITROSTAT) 0.4 MG SL tablet Place 0.4 mg under the tongue every 5 (five) minutes as needed for chest pain (3 doses max). Reported on 01/14/2016   No facility-administered encounter medications on file as of 08/21/2017.     Activities of Daily Living In your present state of health, do you have any difficulty performing the following activities: 08/21/2017  Hearing? Y  Vision? N  Comment wearing glasses. last eye exam w/ Dr.Groat 1 wk ago.  Difficulty concentrating or making  decisions? N  Walking or climbing stairs? N  Dressing or bathing? N  Doing errands, shopping? N  Preparing Food and eating ? N  Using the Toilet? N  In the past six months, have you accidently leaked urine? N  Do you have problems with loss of bowel control? N  Managing your Medications? N  Managing your Finances? N  Housekeeping or managing your Housekeeping? N  Some recent data might be hidden    Patient Care Team: Colon Branch, MD as PCP - General (Internal Medicine) Bjorn Loser, MD as Consulting Physician (Urology) Josue Hector, MD as Consulting Physician (Cardiology) Sydnee Levans, MD as Consulting Physician (Dermatology) Deneise Lever, MD as Consulting Physician (Pulmonary Disease) Alda Berthold, DO as Consulting Physician (Neurology) Inda Castle, MD as Consulting Physician (Gastroenterology) Clent Jacks, MD as Consulting Physician (Ophthalmology)   Assessment:     Physical assessment deferred to PCP.  Exercise Activities and Dietary recommendations Current Exercise Habits: Home exercise routine, Type of exercise: walking, Time (Minutes): 10, Frequency (Times/Week): 3, Weekly Exercise (Minutes/Week): 30, Intensity: Mild   Diet (meal preparation, eat out, water intake, caffeinated beverages, dairy products, fruits and vegetables): well balanced   Goals      Patient Stated   . Maintain current health (pt-stated)      Fall Risk Fall Risk  08/21/2017 03/24/2016 08/27/2015 05/17/2015 01/15/2014  Falls in the past year? No No No No No   Depression Screen PHQ 2/9 Scores 08/21/2017 06/22/2016 03/24/2016 08/27/2015  PHQ - 2 Score 1 3 2 6   PHQ- 9 Score - 5 3 12   Exception Documentation - - Patient refusal -    Cognitive Function MMSE - Mini Mental State Exam 08/21/2017  Orientation to time 5  Orientation to Place 5  Registration 3  Attention/ Calculation 5  Recall 3  Language- name 2 objects 2  Language- repeat 1  Language- follow 3 step command 3   Language- read & follow direction 1  Write a sentence 1  Copy design 1  Total score 30        Immunization History  Administered Date(s) Administered  . Influenza Whole 09/05/2007, 08/13/2009, 08/27/2012  . Influenza,inj,Quad PF,6+ Mos 08/25/2016  . Influenza-Unspecified 08/26/2013, 08/28/2015  . PPD Test 05/16/2011  . Pneumococcal Conjugate-13 05/17/2015  . Pneumococcal Polysaccharide-23 05/18/2011  . Tdap 05/16/2011   Screening Tests Health Maintenance  Topic Date Due  . INFLUENZA VACCINE  06/27/2017  . TETANUS/TDAP  05/15/2021  . PNA vac Low Risk Adult  Completed  Plan:   Follow up with PCP today as scheduled.  Continue to eat heart healthy diet (full of fruits, vegetables, whole grains, lean protein, water--limit salt, fat, and sugar intake) and increase physical activity as tolerated.  Continue doing brain stimulating activities (puzzles, reading, adult coloring books, staying active) to keep memory sharp.   Bring a copy of your living will and/or healthcare power of attorney to your next office visit.   I have personally reviewed and noted the following in the patient's chart:   . Medical and social history . Use of alcohol, tobacco or illicit drugs  . Current medications and supplements . Functional ability and status . Nutritional status . Physical activity . Advanced directives . List of other physicians . Hospitalizations, surgeries, and ER visits in previous 12 months . Vitals . Screenings to include cognitive, depression, and falls . Referrals and appointments  In addition, I have reviewed and discussed with patient certain preventive protocols, quality metrics, and best practice recommendations. A written personalized care plan for preventive services as well as general preventive health recommendations were provided to patient.     Naaman Plummer [place], [place]  08/21/2017

## 2017-08-21 ENCOUNTER — Encounter: Payer: Self-pay | Admitting: Internal Medicine

## 2017-08-21 ENCOUNTER — Ambulatory Visit (INDEPENDENT_AMBULATORY_CARE_PROVIDER_SITE_OTHER): Payer: Medicare Other | Admitting: Internal Medicine

## 2017-08-21 VITALS — BP 108/61 | HR 62 | Ht 74.0 in | Wt 158.2 lb

## 2017-08-21 DIAGNOSIS — R739 Hyperglycemia, unspecified: Secondary | ICD-10-CM

## 2017-08-21 DIAGNOSIS — E782 Mixed hyperlipidemia: Secondary | ICD-10-CM

## 2017-08-21 DIAGNOSIS — Z0000 Encounter for general adult medical examination without abnormal findings: Secondary | ICD-10-CM | POA: Diagnosis not present

## 2017-08-21 MED ORDER — NITROGLYCERIN 0.4 MG SL SUBL: 0.4000 mg | SUBLINGUAL_TABLET | SUBLINGUAL | 2 refills | Status: DC | PRN

## 2017-08-21 NOTE — Assessment & Plan Note (Addendum)
-  Tdap 2012; PNA-- 05/17/15; prevnar 2016; zostavax/shingrix shot declined again.  Flu shot at his place of living -CCS: 01/03/11 w/ Dr. Sandy Salaam. Kaplan, +tics, no polyps, I rec no further scopes, pt in agreement  -Elevated PSA. No further PSAs, pt in agreement.  - diet -exercise discussed -Labs: A1c, FLP

## 2017-08-21 NOTE — Patient Instructions (Addendum)
GO TO THE FRONT DESK Schedule labs to be done this week, fasting  Schedule your next appointment for a  checkup in 6 months  Take Protonix every other day for 2 weeks, then stop     [name] , Thank you for taking time to come for your Medicare Wellness Visit. I appreciate your ongoing commitment to your health goals. Please review the following plan we discussed and let me know if I can assist you in the future.   These are the goals we discussed: Goals      Patient Stated   . Maintain current health (pt-stated)       This is a list of the screening recommended for you and due dates:  Health Maintenance  Topic Date Due  . Flu Shot  06/27/2017  . Tetanus Vaccine  05/15/2021  . Pneumonia vaccines  Completed   Continue to eat heart healthy diet (full of fruits, vegetables, whole grains, lean protein, water--limit salt, fat, and sugar intake) and increase physical activity as tolerated.  Continue doing brain stimulating activities (puzzles, reading, adult coloring books, staying active) to keep memory sharp.    Health Maintenance, Male A healthy lifestyle and preventive care is important for your health and wellness. Ask your health care provider about what schedule of regular examinations is right for you. What should I know about weight and diet? Eat a Healthy Diet  Eat plenty of vegetables, fruits, whole grains, low-fat dairy products, and lean protein.  Do not eat a lot of foods high in solid fats, added sugars, or salt.  Maintain a Healthy Weight Regular exercise can help you achieve or maintain a healthy weight. You should:  Do at least 150 minutes of exercise each week. The exercise should increase your heart rate and make you sweat (moderate-intensity exercise).  Do strength-training exercises at least twice a week.  Watch Your Levels of Cholesterol and Blood Lipids  Have your blood tested for lipids and cholesterol every 5 years starting at 81 years of age. If  you are at high risk for heart disease, you should start having your blood tested when you are 81 years old. You may need to have your cholesterol levels checked more often if: ? Your lipid or cholesterol levels are high. ? You are older than 81 years of age. ? You are at high risk for heart disease.  What should I know about cancer screening? Many types of cancers can be detected early and may often be prevented. Lung Cancer  You should be screened every year for lung cancer if: ? You are a current smoker who has smoked for at least 30 years. ? You are a former smoker who has quit within the past 15 years.  Talk to your health care provider about your screening options, when you should start screening, and how often you should be screened.  Colorectal Cancer  Routine colorectal cancer screening usually begins at 81 years of age and should be repeated every 5-10 years until you are 81 years old. You may need to be screened more often if early forms of precancerous polyps or small growths are found. Your health care provider may recommend screening at an earlier age if you have risk factors for colon cancer.  Your health care provider may recommend using home test kits to check for hidden blood in the stool.  A small camera at the end of a tube can be used to examine your colon (sigmoidoscopy or colonoscopy). This  checks for the earliest forms of colorectal cancer.  Prostate and Testicular Cancer  Depending on your age and overall health, your health care provider may do certain tests to screen for prostate and testicular cancer.  Talk to your health care provider about any symptoms or concerns you have about testicular or prostate cancer.  Skin Cancer  Check your skin from head to toe regularly.  Tell your health care provider about any new moles or changes in moles, especially if: ? There is a change in a mole's size, shape, or color. ? You have a mole that is larger than a pencil  eraser.  Always use sunscreen. Apply sunscreen liberally and repeat throughout the day.  Protect yourself by wearing long sleeves, pants, a wide-brimmed hat, and sunglasses when outside.  What should I know about heart disease, diabetes, and high blood pressure?  If you are 45-24 years of age, have your blood pressure checked every 3-5 years. If you are 61 years of age or older, have your blood pressure checked every year. You should have your blood pressure measured twice-once when you are at a hospital or clinic, and once when you are not at a hospital or clinic. Record the average of the two measurements. To check your blood pressure when you are not at a hospital or clinic, you can use: ? An automated blood pressure machine at a pharmacy. ? A home blood pressure monitor.  Talk to your health care provider about your target blood pressure.  If you are between 26-69 years old, ask your health care provider if you should take aspirin to prevent heart disease.  Have regular diabetes screenings by checking your fasting blood sugar level. ? If you are at a normal weight and have a low risk for diabetes, have this test once every three years after the age of 67. ? If you are overweight and have a high risk for diabetes, consider being tested at a younger age or more often.  A one-time screening for abdominal aortic aneurysm (AAA) by ultrasound is recommended for men aged 59-75 years who are current or former smokers. What should I know about preventing infection? Hepatitis B If you have a higher risk for hepatitis B, you should be screened for this virus. Talk with your health care provider to find out if you are at risk for hepatitis B infection. Hepatitis C Blood testing is recommended for:  Everyone born from [birth year] through 1963.  Anyone with known risk factors for hepatitis C.  Sexually Transmitted Diseases (STDs)  You should be screened each year for STDs including gonorrhea and  chlamydia if: ? You are sexually active and are younger than 81 years of age. ? You are older than 81 years of age and your health care provider tells you that you are at risk for this type of infection. ? Your sexual activity has changed since you were last screened and you are at an increased risk for chlamydia or gonorrhea. Ask your health care provider if you are at risk.  Talk with your health care provider about whether you are at high risk of being infected with HIV. Your health care provider may recommend a prescription medicine to help prevent HIV infection.  What else can I do?  Schedule regular health, dental, and eye exams.  Stay current with your vaccines (immunizations).  Do not use any tobacco products, such as cigarettes, chewing tobacco, and e-cigarettes. If you need help quitting, ask your health care provider.  Limit alcohol intake to no more than 2 drinks per day. One drink equals 12 ounces of beer, 5 ounces of wine, or 1 ounces of hard liquor.  Do not use street drugs.  Do not share needles.  Ask your health care provider for help if you need support or information about quitting drugs.  Tell your health care provider if you often feel depressed.  Tell your health care provider if you have ever been abused or do not feel safe at home. This information is not intended to replace advice given to you by your health care provider. Make sure you discuss any questions you have with your health care provider. Document Released: 05/11/2008 Document Revised: 07/12/2016 Document Reviewed: 08/17/2015 Elsevier Interactive Patient Education  [employer].

## 2017-08-21 NOTE — Progress Notes (Signed)
Subjective:    Patient ID: [name], male    DOB: [birth year], 83 y.o.   MRN: [id number]  DOS:  08/21/2017 Type of visit - description : cpx Interval history: We discussed multiple issues. From time to time he feels anxious, no depression or suicidal ideas. He feels lonely at times. Has been participating in a grieving group because he lost his wife last year and that seems to help. DJD: Continue with neck pain, not severe, "just annoying". No radiation to the arm, no paresthesias. Was seen with nausea, symptoms resolved. Recently developed pain in the jaw, approximately 5 or 6 weeks ago, Fosamax was stopped, pain has improved. Denies any actual problems with his theeth.  Review of Systems Occasional nocturia without dysuria or gross hematuria. Urinary flow is a slightly slow.   Other than above, a 14 point review of systems is negative     Past Medical History:  Diagnosis Date  . Atrial fibrillation ([place])   . CAD    a. 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  . Diverticulosis 2011   Diverticulitis 2004  . Esophageal reflux   . Headache    saw neuro 4-16, MRI done  . HYPERPLASIA, PROSTATE NOS W/URINARY OBST/LUTS   . IBS   . INGUINAL HERNIA   . Lichen planus 5993   [name]  . LUMBAR RADICULOPATHY, RIGHT   . Mixed hyperlipidemia   . OSTEOPOROSIS   . PONV (postoperative nausea and vomiting)   . PREMATURE VENTRICULAR CONTRACTIONS    a. After CABG - was on Coumadin/Multaq for a period of time. Coumadin discontinued 04/2010 after maintaining NSR.  [name] UNSPECIFIED ANEMIA     Past Surgical History:  Procedure Laterality Date  . CATARACT EXTRACTION Right 10/06/2013  . CORONARY ARTERY BYPASS GRAFT     2 VD;anomalous RCA;post op compliacted by AF  . HIP FRACTURE SURGERY Right 03/2007   Dr. Salvadore Farber  . INGUINAL HERNIA REPAIR Left 2012   Dr Brantley Stage  . KNEE SURGERY Right 1977   repair  .  PROSTATE BIOPSY  09/19/2013   [facility]   Family History  Problem Relation Age of Onset  . Stroke Mother   . Pancreatic cancer Father        Deceased, 66  . Breast cancer Sister        Deceased  . Heart disease Sister 60  . Colon cancer Neg Hx   . Prostate cancer Neg Hx     Social History   Social History  . Marital status: Widowed    Spouse name: N/A  . Number of children: 0  . Years of education: N/A   Occupational History  . retired Retired   Social History Main Topics  . Smoking status: Former Smoker    Types: Cigarettes    Quit date: 11/27/1962  . Smokeless tobacco: Never Used  . Alcohol use 0.0 oz/week     Comment:  no wine recently  . Drug use: No  . Sexual activity: No   Other Topics Concern  . Not on file   Social History Narrative   Lives at [facility] in independent living.     Lost wife 06-2016.     Has no children.  Family: nephews-nices in [place]   Retired from [employer].     Still drives.       Allergies as of 08/21/2017  Reactions   Remeron [mirtazapine] Other (See Comments)   Excessive Drowsiness      Medication List       Accurate as of 08/21/17 11:59 PM. Always use your most recent med list.          apixaban 5 MG Tabs tablet Commonly known as:  ELIQUIS Take 1 tablet (5 mg total) by mouth 2 (two) times daily.   aspirin 81 MG tablet Take 81 mg by mouth daily.   cholecalciferol 1000 units tablet Commonly known as:  VITAMIN D Take 1,000 Units by mouth every morning.   dronedarone 400 MG tablet Commonly known as:  MULTAQ TAKE ONE TABLET TWICE DAILY WITH A MEAL   folic acid 1 MG tablet Commonly known as:  FOLVITE Take 1 tablet (1 mg total) by mouth daily.   loratadine 10 MG tablet Commonly known as:  CLARITIN Take 1 tablet (10 mg total) by mouth daily.   multivitamin with minerals Tabs tablet Take 1 tablet by mouth daily.   nitroGLYCERIN 0.4 MG SL tablet Commonly known as:  NITROSTAT Place 1 tablet  (0.4 mg total) under the tongue every 5 (five) minutes x 3 doses as needed for chest pain.   rosuvastatin 10 MG tablet Commonly known as:  CRESTOR TAKE ONE (1) TABLET EACH DAY   TUMS PO Take 1 tablet by mouth 2 (two) times daily as needed (heartburn). For calcium            Discharge Care Instructions        Start     Ordered   08/21/17 0000  Lipid panel     08/21/17 1425   08/21/17 0000  Hemoglobin A1c     08/21/17 1425   08/21/17 0000  nitroGLYCERIN (NITROSTAT) 0.4 MG SL tablet  Every 5 min x3 PRN     08/21/17 1427         Objective:   Physical Exam BP 108/61 (BP Location: Left Arm, Patient Position: Sitting, Cuff Size: Normal)   Pulse 62   Ht 6\' 2"  (1.88 m)   Wt 158 lb 3.2 oz (71.8 kg)   SpO2 98%   BMI 20.31 kg/m   General:   Well developed, well nourished . NAD.  Neck: No  thyromegaly  HEENT:  Normocephalic . Face symmetric, atraumatic Lungs:  CTA B Normal respiratory effort, no intercostal retractions, no accessory muscle use. Heart: RRR,  no murmur.  No pretibial edema bilaterally  Abdomen:  Not distended, soft, non-tender. No rebound or rigidity.   Skin: Exposed areas without rash. Not pale. Not jaundice Neurologic:  alert & oriented X3.  Speech normal, gait appropriate for age and unassisted Strength symmetric and appropriate for age.  Psych: Cognition and judgment appear intact.  Cooperative with normal attention span and concentration.  Behavior appropriate. No anxious or depressed appearing.    Assessment & Plan:   Assessment>> Prediabetes, A1c 6.0 (2015) Hyperlipidemia Anxiety, depression:  -intol to zoloft, Rx lexapro 5 mg 07-2015: intolerant d/t nausea  -lost wife July 2017 CV: ---CAD--CABG 2011 ----P- Atrial fibrillation, PVCs. --Multaq, Elliquis  ----Palpable Ao x years, [place] (-) for AAA 2005 COPD (per CXR 06-2015), PFTs 10-04-15 mild obstruction DJD  Gait-- uses a walker prn GI: GERD, IBS, diverticulosis GU: elevated PSA, BPH ,  (-) bx 2014 Osteoporosis: dexa ~2005 --> rx fosamax, took x 1 year, dexa ~2010 was rec no fosamax at that time. Dexa 09-2015: T score -2.4 --> rx fosamax Lichen planus Headache -- saw neurology 02-2015,  had a MRI/MRA (-)  PLAN Prediabetes: Diet control, A1c has been stable, recheck a A1c. Hyperlipidemia: On Crestor, last LFT satisfactory, check a FLP Anxiety, depression: See HPI, feels somewhat lonely, he is counseled, Rec to continue engaging on social activities including grieving groups. Recommend to call if he feels things are getting worse. Formal counseling is also a very good option. CAD: Asx, RF NTG COPD: Essentially asx DJD: Continue with neck pain, not severe, heating helps. Encouraged to call me if he decides to do something else for possible re-eval. Nausea: See last visit, sxs subsided with PPIs. Will gradually decrease Protonix. Osteoporosis: Has been taking Fosamax for about a year, a few weeks ago developed jaw pain, Fosamax was d/c 07/20/2017, feeling better now. Will do a bone density test 09-2017. We'll consider rechallenge with Fosamax or switch to another agent. RTC 6 months

## 2017-08-22 NOTE — Assessment & Plan Note (Signed)
Prediabetes: Diet control, A1c has been stable, recheck a A1c. Hyperlipidemia: On Crestor, last LFT satisfactory, check a FLP Anxiety, depression: See HPI, feels somewhat lonely, he is counseled, Rec to continue engaging on social activities including grieving groups. Recommend to call if he feels things are getting worse. Formal counseling is also a very good option. CAD: Asx, RF NTG COPD: Essentially asx DJD: Continue with neck pain, not severe, heating helps. Encouraged to call me if he decides to do something else for possible re-eval. Nausea: See last visit, sxs subsided with PPIs. Will gradually decrease Protonix. Osteoporosis: Has been taking Fosamax for about a year, a few weeks ago developed jaw pain, Fosamax was d/c 07/20/2017, feeling better now. Will do a bone density test 09-2017. We'll consider rechallenge with Fosamax or switch to another agent. RTC 6 months

## 2017-08-23 ENCOUNTER — Telehealth: Payer: Self-pay | Admitting: *Deleted

## 2017-08-23 ENCOUNTER — Other Ambulatory Visit (INDEPENDENT_AMBULATORY_CARE_PROVIDER_SITE_OTHER): Payer: Medicare Other

## 2017-08-23 DIAGNOSIS — E782 Mixed hyperlipidemia: Secondary | ICD-10-CM | POA: Diagnosis not present

## 2017-08-23 DIAGNOSIS — R739 Hyperglycemia, unspecified: Secondary | ICD-10-CM

## 2017-08-23 LAB — LIPID PANEL
CHOL/HDL RATIO: 2
Cholesterol: 95 mg/dL (ref 0–200)
HDL: 57.4 mg/dL (ref >39.00)
LDL CALC: 25 mg/dL (ref 0–99)
NonHDL: 37.86
TRIGLYCERIDES: 65 mg/dL (ref 0.0–149.0)
VLDL: 13 mg/dL (ref 0.0–40.0)

## 2017-08-23 LAB — HEMOGLOBIN A1C: Hgb A1c MFr Bld: 6 % (ref 4.6–6.5)

## 2017-08-23 NOTE — Telephone Encounter (Signed)
Received Provider Query from [facility] for Medical Record Clarification on Depression for Coding purposes, OV note attached; forwarded to provider/SLS 09/27

## 2017-08-27 NOTE — Telephone Encounter (Signed)
Form completed and faxed to [facility] at [phone]. Form sent for scanning.

## 2017-08-28 ENCOUNTER — Other Ambulatory Visit: Payer: Self-pay | Admitting: *Deleted

## 2017-08-28 DIAGNOSIS — Z23 Encounter for immunization: Secondary | ICD-10-CM | POA: Diagnosis not present

## 2017-08-28 MED ORDER — APIXABAN 5 MG PO TABS: 5.0000 mg | ORAL_TABLET | Freq: Two times a day (BID) | ORAL | 5 refills | Status: DC

## 2017-08-28 NOTE — Telephone Encounter (Signed)
Age 83 years Saw Dr Johnsie Cancel on 09/11/2016 Has appt to see Dr Johnsie Cancel on 09/26/2017 08/21/2017 Wt 71.8kg 07/10/2017 SrCr 0.95 05/21/2017 Hgb 13.6 HCT 40.5 Refill done for Eliquis 5mg  q 12 hours as requested

## 2017-09-25 NOTE — Progress Notes (Signed)
Patient ID: [name], male   DOB: 10-05-1930, 83 y.o.   MRN: [id number] Mr. Labrosse is an 83 y.o.  M with history of CAD s/p CABGx3 in 9485 complicated by post-op atrial fibrillation, hyperlipidemia, GERD He has history of AF after CABG treated with Multaq but had converted to NSR and Coumadin was discontinued in 04/2010. Hospitalized in January 2015 with nausea and GI illness Afib at time d/c with Eliquis , cardizem beta blocker and Multaq 4/15 ECG shows SB rate 45 Some fatigue Toprol stopped 4/15  HR improved  On Protonix and GI symptoms persist on probiotic now  Myovue 09/08/15 reviewed low risk   Nuclear stress EF: 62%.  The left ventricular ejection fraction is normal (55-65%).  1 mm of Horizontal ST segment depression was noted during stress in the II, III, aVF, V6, V5 and V4 leads.  Defect 1: There is a moderate sized defect of mild severity present in the basal inferoseptal, basal inferior, basal inferolateral, apical anterior and apical septal location. This is a fixed defect and in the setting of normal LVF this is most consistent with diaphragamatic attenuation artifact.  This is a low risk study.  Wife passed in July 2017 had bad Alzheimer's  On folate for anemia improved  On Fosamax for osteoporosis now   Seems depressed [place] and doesn't like to play cards or golf and that's all they do there   ROS: Denies fever, malais, weight loss, blurry vision, decreased visual acuity, cough, sputum, SOB, hemoptysis, pleuritic pain, palpitaitons, heartburn, abdominal pain, melena, lower extremity edema, claudication, or rash.  All other systems reviewed and negative  General: BP 100/60   Pulse (!) 54   Ht 6\' 2"  (1.88 m)   Wt 158 lb 12.8 oz (72 kg)   SpO2 98%   BMI 20.39 kg/m  Affect appropriate Healthy:  appears stated age 28: normal Neck supple with no adenopathy JVP normal no bruits no thyromegaly Lungs clear with no wheezing and good diaphragmatic  motion Heart:  S1/S2 no murmur, no rub, gallop or click PMI normal Abdomen: benighn, BS positve, no tenderness, no AAA no bruit.  No HSM or HJR Distal pulses intact with no bruits No edema Neuro non-focal Skin warm and dry No muscular weakness    Current Outpatient Prescriptions  Medication Sig Dispense Refill  . apixaban (ELIQUIS) 5 MG TABS tablet Take 1 tablet (5 mg total) by mouth 2 (two) times daily. 60 tablet 5  . aspirin 81 MG tablet Take 81 mg by mouth daily.    . Calcium Carbonate Antacid (TUMS PO) Take 1 tablet by mouth 2 (two) times daily as needed (heartburn). For calcium    . cholecalciferol (VITAMIN D) 1000 UNITS tablet Take 1,000 Units by mouth every morning.      . dronedarone (MULTAQ) 400 MG tablet TAKE ONE TABLET TWICE DAILY WITH A MEAL 60 tablet 4  . folic acid (FOLVITE) 1 MG tablet Take 1 tablet (1 mg total) by mouth daily. 30 tablet 11  . loratadine (CLARITIN) 10 MG tablet Take 1 tablet (10 mg total) by mouth daily. 30 tablet 11  . Multiple Vitamin (MULITIVITAMIN WITH MINERALS) TABS Take 1 tablet by mouth daily.      . nitroGLYCERIN (NITROSTAT) 0.4 MG SL tablet Place 1 tablet (0.4 mg total) under the tongue every 5 (five) minutes x 3 doses as needed for chest pain. 30 tablet 2  . rosuvastatin (CRESTOR) 10 MG tablet TAKE ONE (1) TABLET EACH DAY  90 tablet 3   No current facility-administered medications for this visit.     Allergies  Remeron [mirtazapine]  Electrocardiogram:  4/15  SB rate 45  ICRBBB  Toprol stopped  09/26/17 SR rate 54 ICRBBB   Assessment and Plan CAD/CABG:  2011   Stable no angina continue medical Rx 09/08/15 myovue non ischemic  PAF: Maint NSR no palpitaitons continue multaq and eliquis  GERD continue protonix improved Chol  Cholesterol is at goal.  Continue current dose of statin and diet Rx.  No myalgias or side effects.  F/U  LFT's in 6 months. Lab Results  Component Value Date   LDLCALC 25 08/23/2017            Anemia:   Improved on folate  Lab Results  Component Value Date   HCT 40.5 05/21/2017   Osteoporosis:  Active on fosamax f/u primary   Jenkins Rouge

## 2017-09-26 ENCOUNTER — Encounter: Payer: Self-pay | Admitting: Cardiovascular Disease

## 2017-09-26 ENCOUNTER — Ambulatory Visit (INDEPENDENT_AMBULATORY_CARE_PROVIDER_SITE_OTHER): Payer: Medicare Other | Admitting: Cardiovascular Disease

## 2017-09-26 VITALS — BP 100/60 | HR 54 | Ht 74.0 in | Wt 158.8 lb

## 2017-09-26 DIAGNOSIS — I480 Paroxysmal atrial fibrillation: Secondary | ICD-10-CM | POA: Diagnosis not present

## 2017-09-26 DIAGNOSIS — I25119 Atherosclerotic heart disease of native coronary artery with unspecified angina pectoris: Secondary | ICD-10-CM | POA: Diagnosis not present

## 2017-09-26 NOTE — Patient Instructions (Addendum)

## 2017-10-01 ENCOUNTER — Other Ambulatory Visit: Payer: Self-pay | Admitting: Cardiovascular Disease

## 2017-10-09 DIAGNOSIS — Z85828 Personal history of other malignant neoplasm of skin: Secondary | ICD-10-CM | POA: Diagnosis not present

## 2017-10-09 DIAGNOSIS — L918 Other hypertrophic disorders of the skin: Secondary | ICD-10-CM | POA: Diagnosis not present

## 2017-10-09 DIAGNOSIS — L821 Other seborrheic keratosis: Secondary | ICD-10-CM | POA: Diagnosis not present

## 2017-10-09 DIAGNOSIS — L738 Other specified follicular disorders: Secondary | ICD-10-CM | POA: Diagnosis not present

## 2017-10-09 DIAGNOSIS — L570 Actinic keratosis: Secondary | ICD-10-CM | POA: Diagnosis not present

## 2017-12-11 ENCOUNTER — Other Ambulatory Visit: Payer: Self-pay | Admitting: *Deleted

## 2017-12-11 MED ORDER — DRONEDARONE HCL 400 MG PO TABS: ORAL_TABLET | ORAL | 8 refills | Status: DC

## 2018-01-01 ENCOUNTER — Other Ambulatory Visit: Payer: Self-pay | Admitting: Cardiovascular Disease

## 2018-02-19 ENCOUNTER — Encounter: Payer: Self-pay | Admitting: Internal Medicine

## 2018-02-19 ENCOUNTER — Telehealth: Payer: Self-pay

## 2018-02-19 ENCOUNTER — Ambulatory Visit (INDEPENDENT_AMBULATORY_CARE_PROVIDER_SITE_OTHER): Payer: Medicare Other | Admitting: Internal Medicine

## 2018-02-19 VITALS — BP 104/68 | HR 53 | Temp 97.5°F | Resp 14 | Ht 74.0 in | Wt 160.2 lb

## 2018-02-19 DIAGNOSIS — M810 Age-related osteoporosis without current pathological fracture: Secondary | ICD-10-CM

## 2018-02-19 DIAGNOSIS — I480 Paroxysmal atrial fibrillation: Secondary | ICD-10-CM | POA: Diagnosis not present

## 2018-02-19 DIAGNOSIS — I25119 Atherosclerotic heart disease of native coronary artery with unspecified angina pectoris: Secondary | ICD-10-CM

## 2018-02-19 LAB — BASIC METABOLIC PANEL
BUN: 25 mg/dL — AB (ref 6–23)
CHLORIDE: 102 meq/L (ref 96–112)
CO2: 30 mEq/L (ref 19–32)
Calcium: 9.6 mg/dL (ref 8.4–10.5)
Creatinine, Ser: 0.88 mg/dL (ref 0.40–1.50)
GFR: 87.4 mL/min (ref >60.00)
GLUCOSE: 89 mg/dL (ref 70–99)
POTASSIUM: 4.5 meq/L (ref 3.5–5.1)
Sodium: 138 mEq/L (ref 135–145)

## 2018-02-19 LAB — TSH: TSH: 2.61 u[IU]/mL (ref 0.35–4.50)

## 2018-02-19 NOTE — Patient Instructions (Signed)
GO TO THE LAB : Get the blood work     GO TO THE FRONT DESK Schedule your next appointment for a  physical exam in 6 months  

## 2018-02-19 NOTE — Progress Notes (Signed)
Pre visit review using our clinic review tool, if applicable. No additional management support is needed unless otherwise documented below in the visit note. 

## 2018-02-19 NOTE — Telephone Encounter (Signed)
Prolia benefits initiated- forwarded to [place].

## 2018-02-19 NOTE — Progress Notes (Signed)
Subjective:    Patient ID: [name], male    DOB: January 30, 1930, 84 y.o.   MRN: [id number]  DOS:  02/19/2018 Type of visit - description : rov Interval history: In general feels well physically. He lives @ [facility] independent living; since he lost his wife he feels sometimes lonely, has not "real friends" in the community. Labs reviewed. Medications reviewed, good compliance without apparent side effects.  Review of Systems  Denies chest pain or difficulty breathing No palpitations No cough, sputum production or wheezing.  Past Medical History:  Diagnosis Date  . Atrial fibrillation ([place])   . CAD    a. 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  . Diverticulosis 2011   Diverticulitis 2004  . Esophageal reflux   . Headache    saw neuro 4-16, MRI done  . HYPERPLASIA, PROSTATE NOS W/URINARY OBST/LUTS   . IBS   . INGUINAL HERNIA   . Lichen planus [id number]   [name]  . LUMBAR RADICULOPATHY, RIGHT   . Mixed hyperlipidemia   . OSTEOPOROSIS   . PONV (postoperative nausea and vomiting)   . PREMATURE VENTRICULAR CONTRACTIONS    a. After CABG - was on Coumadin/Multaq for a period of time. Coumadin discontinued 04/2010 after maintaining NSR.  [name] UNSPECIFIED ANEMIA     Past Surgical History:  Procedure Laterality Date  . CATARACT EXTRACTION Right 10/06/2013  . CORONARY ARTERY BYPASS GRAFT     2 VD;anomalous RCA;post op compliacted by AF  . HIP FRACTURE SURGERY Right 03/2007   Dr. Salvadore Farber  . INGUINAL HERNIA REPAIR Left 2012   Dr Brantley Stage  . KNEE SURGERY Right 1977   repair  . PROSTATE BIOPSY  09/19/2013   [facility]    Social History   Socioeconomic History  . Marital status: Widowed    Spouse name: Not on file  . Number of children: 0  . Years of education: Not on file  . Highest education level: Not on file  Occupational History  . Occupation: retired    Fish farm manager: RETIRED    Social Needs  . Financial resource strain: Not on file  . Food insecurity:    Worry: Not on file    Inability: Not on file  . Transportation needs:    Medical: Not on file    Non-medical: Not on file  Tobacco Use  . Smoking status: Former Smoker    Types: Cigarettes    Last attempt to quit: 11/27/1962    Years since quitting: 55.2  . Smokeless tobacco: Never Used  Substance and Sexual Activity  . Alcohol use: Yes    Alcohol/week: 0.0 oz    Comment:  no wine recently  . Drug use: No  . Sexual activity: Never  Lifestyle  . Physical activity:    Days per week: Not on file    Minutes per session: Not on file  . Stress: Not on file  Relationships  . Social connections:    Talks on phone: Not on file    Gets together: Not on file    Attends religious service: Not on file    Active member of club or organization: Not on file    Attends meetings of clubs or organizations: Not on file    Relationship status: Not on file  . Intimate partner violence:    Fear of current or ex partner: Not on file  Emotionally abused: Not on file    Physically abused: Not on file    Forced sexual activity: Not on file  Other Topics Concern  . Not on file  Social History Narrative   Lives at [facility] in independent living.     Lost wife 06-2016.     Has no children.  Family: nephews-nices in [place]   Retired from [employer].     Still drives.       Allergies as of 02/19/2018      Reactions   Remeron [mirtazapine] Other (See Comments)   Excessive Drowsiness      Medication List        Accurate as of 02/19/18 11:59 PM. Always use your most recent med list.          apixaban 5 MG Tabs tablet Commonly known as:  ELIQUIS Take 1 tablet (5 mg total) by mouth 2 (two) times daily.   aspirin 81 MG tablet Take 81 mg by mouth daily.   cholecalciferol 1000 units tablet Commonly known as:  VITAMIN D Take 1,000 Units by mouth every morning.   dronedarone 400 MG tablet Commonly  known as:  MULTAQ TAKE ONE TABLET TWICE DAILY WITH A MEAL   folic acid 1 MG tablet Commonly known as:  FOLVITE TAKE ONE (1) TABLET EACH DAY   loratadine 10 MG tablet Commonly known as:  CLARITIN Take 1 tablet (10 mg total) by mouth daily.   multivitamin with minerals Tabs tablet Take 1 tablet by mouth daily.   nitroGLYCERIN 0.4 MG SL tablet Commonly known as:  NITROSTAT Place 1 tablet (0.4 mg total) under the tongue every 5 (five) minutes x 3 doses as needed for chest pain.   rosuvastatin 10 MG tablet Commonly known as:  CRESTOR TAKE ONE (1) TABLET EACH DAY   TUMS PO Take 1 tablet by mouth 2 (two) times daily as needed (heartburn). For calcium         Objective:   Physical Exam BP 104/68 (BP Location: Left Arm, Patient Position: Sitting, Cuff Size: Small)   Pulse (!) 53   Temp (!) 97.5 F (36.4 C) (Oral)   Resp 14   Ht 6\' 2"  (1.88 m)   Wt 160 lb 4 oz (72.7 kg)   SpO2 95%   BMI 20.57 kg/m  General:   Well developed, well nourished . NAD.  HEENT:  Normocephalic . Face symmetric, atraumatic Lungs:  CTA B Normal respiratory effort, no intercostal retractions, no accessory muscle use. Heart: RRR,  no murmur.  No pretibial edema bilaterally  Skin: Not pale. Not jaundice Neurologic:  alert & oriented X3.  Speech normal, gait appropriate for age and unassisted Psych--  Cognition and judgment appear intact.  Cooperative with normal attention span and concentration.  Behavior appropriate. No anxious or depressed appearing.      Assessment & Plan:   Assessment>> Prediabetes, A1c 6.0 (2015) Hyperlipidemia Anxiety, depression:  -intol to zoloft, Rx lexapro 5 mg 07-2015: intolerant d/t nausea  -lost wife July 2017 CV: ---CAD--CABG 2011 ----P- Atrial fibrillation, PVCs. --Multaq, Elliquis  ----Palpable Ao x years, [place] (-) for AAA 2005 COPD (per CXR 06-2015), PFTs 10-04-15 mild obstruction DJD  Gait-- uses a walker prn GI: GERD, IBS, diverticulosis GU: elevated  PSA, BPH , (-) bx 2014 Osteoporosis: -hip FX 2008 - dexa ~2005 --> rx fosamax, took x 1 year, dexa ~2010 was rec no fosamax at that time. Dexa 09-2015: T score -2.4 --> rx fosamax, d/c d/t jaw  pain [id number] Lichen planus Headache -- saw neurology 02-2015, had a MRI/MRA (-)  PLAN CAD, PAF: Saw cardiology, 08-2018, felt to be stable.  Check a BMP and TSH Osteoporosis: Last T score -2.4, history of hip fracture, not on Fosamax due to joint pain.  Risks of fractures and consequences d/w pt, will continue with vitamin D and  start Prolia. Anxiety depression: Mild, is mostly feeling lonely, patient is counseled.  Declined formal counseling Other problems seem stable.   RTC 6 months CPX  F2F ~ 24

## 2018-02-20 NOTE — Assessment & Plan Note (Signed)
CAD, PAF: Saw cardiology, 08-2018, felt to be stable.  Check a BMP and TSH Osteoporosis: Last T score -2.4, history of hip fracture, not on Fosamax due to joint pain.  Risks of fractures and consequences d/w pt, will continue with vitamin D and  start Prolia. Anxiety depression: Mild, is mostly feeling lonely, patient is counseled.  Declined formal counseling Other problems seem stable.   RTC 6 months CPX

## 2018-02-21 NOTE — Telephone Encounter (Signed)
Prolia benefits received PA not required 20% co-insurance for Prolia $30 Admin fee   Patient may owe approximately $230 OOP

## 2018-02-28 ENCOUNTER — Other Ambulatory Visit: Payer: Self-pay | Admitting: *Deleted

## 2018-02-28 MED ORDER — APIXABAN 5 MG PO TABS: 5.0000 mg | ORAL_TABLET | Freq: Two times a day (BID) | ORAL | 5 refills | Status: DC

## 2018-02-28 NOTE — Telephone Encounter (Signed)
Medication has been ordered.

## 2018-02-28 NOTE — Telephone Encounter (Signed)
Spoke w/ Pt- he has agreed to start Prolia. We have schedule nurse visit for 03/07/2018. Gilmore Laroche- can you order Prolia for him please?

## 2018-02-28 NOTE — Telephone Encounter (Signed)
Eliquis 5mg  refill request received; pt is 84 yrs old, wt-72.7kg, Crea-0.88 on 02/19/18, last seen by Dr. Johnsie Cancel on 09/26/17; will end in refill to requested pharmacy.

## 2018-03-04 NOTE — Telephone Encounter (Signed)
Medication arrived and in fridge for pt.

## 2018-03-07 ENCOUNTER — Ambulatory Visit (INDEPENDENT_AMBULATORY_CARE_PROVIDER_SITE_OTHER): Payer: Medicare Other

## 2018-03-07 DIAGNOSIS — M810 Age-related osteoporosis without current pathological fracture: Secondary | ICD-10-CM | POA: Diagnosis not present

## 2018-03-07 MED ORDER — DENOSUMAB 60 MG/ML ~~LOC~~ SOLN
60.0000 mg | Freq: Once | SUBCUTANEOUS | Status: AC
  Administered 2018-03-07: 60 mg via SUBCUTANEOUS

## 2018-03-07 NOTE — Progress Notes (Addendum)
Pre visit review using our clinic review tool, if applicable. No additional management support is needed unless otherwise documented below in the visit note.  Pt here today for his first Prolia injection. 71mL injected into L arm. Pt tolerated injection well. He is instructed to let us know if he has any issues with medication. Next in 6 months. We will contact Pt closer to time to schedule. Pt verbalized understanding.   Kathlene November, MD

## 2018-03-27 ENCOUNTER — Encounter: Payer: Self-pay | Admitting: Internal Medicine

## 2018-03-27 ENCOUNTER — Ambulatory Visit (INDEPENDENT_AMBULATORY_CARE_PROVIDER_SITE_OTHER): Payer: Medicare Other | Admitting: Internal Medicine

## 2018-03-27 VITALS — BP 122/60 | HR 56 | Temp 97.8°F | Resp 14 | Ht 74.0 in | Wt 157.0 lb

## 2018-03-27 DIAGNOSIS — N429 Disorder of prostate, unspecified: Secondary | ICD-10-CM

## 2018-03-27 DIAGNOSIS — R110 Nausea: Secondary | ICD-10-CM | POA: Diagnosis not present

## 2018-03-27 DIAGNOSIS — R972 Elevated prostate specific antigen [PSA]: Secondary | ICD-10-CM | POA: Diagnosis not present

## 2018-03-27 MED ORDER — PANTOPRAZOLE SODIUM 40 MG PO TBEC: 40.0000 mg | DELAYED_RELEASE_TABLET | Freq: Every day | ORAL | 0 refills | Status: DC

## 2018-03-27 NOTE — Patient Instructions (Signed)
GO TO THE LAB : Get the blood work      Continue Protonix once daily for 4 weeks, in the morning before breakfast  If you are not gradually improving in the next 2 weeks let me know Also call if you have severe symptoms, fever, chills, blood in the stools, stomach pain

## 2018-03-27 NOTE — Progress Notes (Signed)
Subjective:    Patient ID: [name], male    DOB: 03-04-1930, 84 y.o.   MRN: [id number]  DOS:  03/27/2018 Type of visit - description : acute  Interval history: She has a long history of on and off nausea and vomiting. This time he developed nausea only for the last 3 or 4 days, symptoms are on and off. No vomiting No abdominal pain, symptoms somewhat decreased when he eats. Denies taking NSAIDs, started Protonix 3 days ago, taking them correctly in the morning on empty a stomach.  Review of Systems No fever chills No diarrhea or bloody stools.  No constipation.  No heartburn No change in the color of the stools  Past Medical History:  Diagnosis Date  . Atrial fibrillation ([place])   . CAD    a. 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  . Diverticulosis 2011   Diverticulitis 2004  . Esophageal reflux   . Headache    saw neuro 4-16, MRI done  . HYPERPLASIA, PROSTATE NOS W/URINARY OBST/LUTS   . IBS   . INGUINAL HERNIA   . Lichen planus [id number]   [name]  . LUMBAR RADICULOPATHY, RIGHT   . Mixed hyperlipidemia   . OSTEOPOROSIS   . PONV (postoperative nausea and vomiting)   . PREMATURE VENTRICULAR CONTRACTIONS    a. After CABG - was on Coumadin/Multaq for a period of time. Coumadin discontinued 04/2010 after maintaining NSR.  [name] UNSPECIFIED ANEMIA     Past Surgical History:  Procedure Laterality Date  . CATARACT EXTRACTION Right 10/06/2013  . CORONARY ARTERY BYPASS GRAFT     2 VD;anomalous RCA;post op compliacted by AF  . HIP FRACTURE SURGERY Right 03/2007   Dr. Salvadore Farber  . INGUINAL HERNIA REPAIR Left 2012   Dr Brantley Stage  . KNEE SURGERY Right 1977   repair  . PROSTATE BIOPSY  09/19/2013   [facility]    Social History   Socioeconomic History  . Marital status: Widowed    Spouse name: Not on file  . Number of children: 0  . Years of education: Not on file  . Highest education  level: Not on file  Occupational History  . Occupation: retired    Fish farm manager: RETIRED  Social Needs  . Financial resource strain: Not on file  . Food insecurity:    Worry: Not on file    Inability: Not on file  . Transportation needs:    Medical: Not on file    Non-medical: Not on file  Tobacco Use  . Smoking status: Former Smoker    Types: Cigarettes    Last attempt to quit: 11/27/1962    Years since quitting: 55.3  . Smokeless tobacco: Never Used  Substance and Sexual Activity  . Alcohol use: Yes    Alcohol/week: 0.0 oz    Comment:  no wine recently  . Drug use: No  . Sexual activity: Never  Lifestyle  . Physical activity:    Days per week: Not on file    Minutes per session: Not on file  . Stress: Not on file  Relationships  . Social connections:    Talks on phone: Not on file    Gets together: Not on file    Attends religious service: Not on file    Active member of club or organization: Not on file    Attends meetings of clubs or organizations: Not on file  Relationship status: Not on file  . Intimate partner violence:    Fear of current or ex partner: Not on file    Emotionally abused: Not on file    Physically abused: Not on file    Forced sexual activity: Not on file  Other Topics Concern  . Not on file  Social History Narrative   Lives at [place] [facility] [place] in independent living.     Lost wife 06-2016.     Has no children.  Family: nephews-nices in [place]   Retired from [employer].     Still drives.       Allergies as of 03/27/2018      Reactions   Remeron [mirtazapine] Other (See Comments)   Excessive Drowsiness      Medication List        Accurate as of 03/27/18 11:59 PM. Always use your most recent med list.          apixaban 5 MG Tabs tablet Commonly known as:  ELIQUIS Take 1 tablet (5 mg total) by mouth 2 (two) times daily.   aspirin 81 MG tablet Take 81 mg by mouth daily.   cholecalciferol 1000 units tablet Commonly known as:  VITAMIN  D Take 1,000 Units by mouth every morning.   denosumab 60 MG/ML Sosy injection Commonly known as:  PROLIA Inject 60 mg into the skin every 6 (six) months.   dronedarone 400 MG tablet Commonly known as:  MULTAQ TAKE ONE TABLET TWICE DAILY WITH A MEAL   folic acid 1 MG tablet Commonly known as:  FOLVITE TAKE ONE (1) TABLET EACH DAY   loratadine 10 MG tablet Commonly known as:  CLARITIN Take 1 tablet (10 mg total) by mouth daily.   multivitamin with minerals Tabs tablet Take 1 tablet by mouth daily.   nitroGLYCERIN 0.4 MG SL tablet Commonly known as:  NITROSTAT Place 1 tablet (0.4 mg total) under the tongue every 5 (five) minutes x 3 doses as needed for chest pain.   pantoprazole 40 MG tablet Commonly known as:  PROTONIX Take 1 tablet (40 mg total) by mouth daily before breakfast for 14 days.   rosuvastatin 10 MG tablet Commonly known as:  CRESTOR TAKE ONE (1) TABLET EACH DAY   TUMS PO Take 1 tablet by mouth 2 (two) times daily as needed (heartburn). For calcium          Objective:   Physical Exam BP 122/60 (BP Location: Left Arm, Patient Position: Sitting, Cuff Size: Small)   Pulse (!) 56   Temp 97.8 F (36.6 C) (Oral)   Resp 14   Ht 6\' 2"  (1.88 m)   Wt 157 lb (71.2 kg)   SpO2 97%   BMI 20.16 kg/m  General:   Well developed, well nourished . NAD.  HEENT:  Normocephalic . Face symmetric, atraumatic.  Not pale Lungs:  CTA B Normal respiratory effort, no intercostal retractions, no accessory muscle use. Heart: RRR,  no murmur.  no pretibial edema bilaterally  Abdomen:  Not distended, soft, non-tender.  Palpable aorta, not a new issue, no bruit, no tenderness.  No rebound or rigidity.   DRE: Normal sphincter tone, no stools found, prostate is mildly enlarged on the L, larger and more firm on the right side.  Nontender. Skin: Not pale. Not jaundice Neurologic:  alert & oriented X3.  Speech normal, gait appropriate for age and unassisted Psych--    Cognition and judgment appear intact.  Cooperative with normal attention span and concentration.  Behavior appropriate. No anxious or depressed appearing.     Assessment & Plan:   Assessment Prediabetes, A1c 6.0 (2015) Hyperlipidemia Anxiety, depression:  -intol to zoloft, Rx lexapro 5 mg 07-2015: intolerant d/t nausea  -lost wife July 2017 CV: ---CAD--CABG 2011 ----P- Atrial fibrillation, PVCs. --Multaq, Elliquis  ----Palpable Ao x years, [place] (-) for AAA 2005 COPD (per CXR 06-2015), PFTs 10-04-15 mild obstruction DJD  Gait-- uses a walker prn GI: GERD, IBS, diverticulosis GU: elevated PSA, BPH , (-) bx 2014 Osteoporosis: -hip FX 2008 - dexa ~2005 --> rx fosamax, took x 1 year, dexa ~2010 was rec no fosamax at that time. Dexa 09-2015: T score -2.4 --> rx fosamax, d/c d/t jaw pain 06-2017; prolia #26 February 4331 Lichen planus Headache -- saw neurology 02-2015, had a MRI/MRA (-)  PLAN Nausea: On and off symptoms for a while, he has no history of abdominal surgeries, similar symptoms 05-2017, at the time the ultrasound was negative.  Last colonoscopy 2012. We will get CBC, LFTs and continue PPIs for 4 weeks.  If symptoms are not better, or he has red flag symptoms (see AVS) he will let me know. Elevated PSA, abnormal prostate exam:  + h/o n BPH, elevated PSA, negative biopsy in 2014.  Patient made aware of the DRE, we had a long discussion about testing or not testing his PSA. If it is  very elevated, will offer him a referral to urology for consideration of further testing although that is not mandatory given his age.  After a long discussion he decided to check a PSA.   F2F> 25 min

## 2018-03-27 NOTE — Progress Notes (Signed)
Pre visit review using our clinic review tool, if applicable. No additional management support is needed unless otherwise documented below in the visit note. 

## 2018-03-28 LAB — HEPATIC FUNCTION PANEL
ALBUMIN: 4.2 g/dL (ref 3.5–5.2)
ALT: 13 U/L (ref 0–53)
AST: 19 U/L (ref 0–37)
Alkaline Phosphatase: 47 U/L (ref 39–117)
Bilirubin, Direct: 0.1 mg/dL (ref 0.0–0.3)
Total Bilirubin: 0.5 mg/dL (ref 0.2–1.2)
Total Protein: 6.7 g/dL (ref 6.0–8.3)

## 2018-03-28 LAB — CBC WITH DIFFERENTIAL/PLATELET
Basophils Absolute: 0.1 10*3/uL (ref 0.0–0.1)
Basophils Relative: 0.9 % (ref 0.0–3.0)
EOS PCT: 5.2 % — AB (ref 0.0–5.0)
Eosinophils Absolute: 0.4 10*3/uL (ref 0.0–0.7)
HCT: 41.1 % (ref 39.0–52.0)
Hemoglobin: 13.8 g/dL (ref 13.0–17.0)
LYMPHS ABS: 1.8 10*3/uL (ref 0.7–4.0)
Lymphocytes Relative: 21.9 % (ref 12.0–46.0)
MCHC: 33.6 g/dL (ref 30.0–36.0)
MCV: 94 fl (ref 78.0–100.0)
MONOS PCT: 10.6 % (ref 3.0–12.0)
Monocytes Absolute: 0.9 10*3/uL (ref 0.1–1.0)
NEUTROS ABS: 5.1 10*3/uL (ref 1.4–7.7)
NEUTROS PCT: 61.4 % (ref 43.0–77.0)
PLATELETS: 175 10*3/uL (ref 150.0–400.0)
RBC: 4.37 Mil/uL (ref 4.22–5.81)
RDW: 15.9 % — ABNORMAL HIGH (ref 11.5–15.5)
WBC: 8.2 10*3/uL (ref 4.0–10.5)

## 2018-03-28 LAB — PSA: PSA: 3.74 ng/mL (ref 0.10–4.00)

## 2018-03-29 NOTE — Assessment & Plan Note (Signed)
Nausea: On and off symptoms for a while, he has no history of abdominal surgeries, similar symptoms 05-2017, at the time the ultrasound was negative.  Last colonoscopy 2012. We will get CBC, LFTs and continue PPIs for 4 weeks.  If symptoms are not better, or he has red flag symptoms (see AVS) he will let me know. Elevated PSA, abnormal prostate exam:  + h/o n BPH, elevated PSA, negative biopsy in 2014.  Patient made aware of the DRE, we had a long discussion about testing or not testing his PSA. If it is  very elevated, will offer him a referral to urology for consideration of further testing although that is not mandatory given his age.  After a long discussion he decided to check a PSA.

## 2018-04-10 ENCOUNTER — Telehealth: Payer: Self-pay | Admitting: Internal Medicine

## 2018-04-10 NOTE — Telephone Encounter (Signed)
Copied from [place] [id number]. Topic: Quick Communication - See Telephone Encounter >> Apr 10, 2018  1:31 PM Bea Graff, NT wrote: CRM for notification. See Telephone encounter for: 04/10/18. Pt calling to just let Dr. Larose Kells he is doing much better since his last appointment.

## 2018-04-10 NOTE — Telephone Encounter (Signed)
FYI to PCP

## 2018-04-13 NOTE — Telephone Encounter (Signed)
Thank you :)

## 2018-05-17 DIAGNOSIS — T1511XA Foreign body in conjunctival sac, right eye, initial encounter: Secondary | ICD-10-CM | POA: Diagnosis not present

## 2018-05-17 DIAGNOSIS — S0501XA Injury of conjunctiva and corneal abrasion without foreign body, right eye, initial encounter: Secondary | ICD-10-CM | POA: Diagnosis not present

## 2018-06-05 ENCOUNTER — Encounter: Payer: Self-pay | Admitting: Family

## 2018-06-05 ENCOUNTER — Ambulatory Visit (INDEPENDENT_AMBULATORY_CARE_PROVIDER_SITE_OTHER): Payer: Medicare Other | Admitting: Family

## 2018-06-05 VITALS — BP 130/65 | HR 55 | Temp 98.1°F | Resp 16 | Ht 74.0 in | Wt 157.8 lb

## 2018-06-05 DIAGNOSIS — R110 Nausea: Secondary | ICD-10-CM | POA: Diagnosis not present

## 2018-06-05 NOTE — Progress Notes (Signed)
Subjective:     Patient ID: [name], [name]   DOB: 12-Mar-1930, 84 y.o.   MRN: [id number]  HPI   [name] is am 84 yr old [name] who presents today with chief complaint of nausea. She saw Dr. Larose Kells with the same complaint in May. At that time [name] was given trial of protonix, [name], BMET, CBC and tSH performed- all OK.    Reports that nausea occurs "any time" except in the middle of the night. Has symptoms within the first 30 minutes of waking.  Notes that this appetite is essentially unchanged despite the nausea. [name] has not vomited in several years.  Notes that the nausea has been going on intermittently for many years.  Becoming more frequent. Reports that [name] had some Lower abdominal pain a few months ago but it resolved.  [name] reports that [name] moves his bowels most every morning and most every afternoon.    Review of Systems    see HPI  Past Medical History:  Diagnosis Date  . Atrial fibrillation ([place])   . CAD    a. 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  . Diverticulosis 2011   Diverticulitis 2004  . Esophageal reflux   . Headache    saw neuro 4-16, MRI done  . HYPERPLASIA, PROSTATE NOS W/URINARY OBST/LUTS   . IBS   . INGUINAL HERNIA   . Lichen planus [id number]   [name]  . LUMBAR RADICULOPATHY, RIGHT   . Mixed hyperlipidemia   . OSTEOPOROSIS   . PONV (postoperative nausea and vomiting)   . PREMATURE VENTRICULAR CONTRACTIONS    a. After CABG - was on Coumadin/Multaq for a period of time. Coumadin discontinued 04/2010 after maintaining NSR.  [name] UNSPECIFIED ANEMIA      Social History   Socioeconomic History  . Marital status: Widowed    Spouse name: Not on file  . Number of children: 0  . Years of education: Not on file  . Highest education level: Not on file  Occupational History  . Occupation: retired    Fish farm manager: RETIRED  Social Needs  . Financial resource strain: Not on file  . Food insecurity:     Worry: Not on file    Inability: Not on file  . Transportation needs:    Medical: Not on file    Non-medical: Not on file  Tobacco Use  . Smoking status: Former Smoker    Types: Cigarettes    Last attempt to quit: 11/27/1962    Years since quitting: 55.5  . Smokeless tobacco: Never Used  Substance and Sexual Activity  . Alcohol use: Yes    Alcohol/week: 0.0 oz    Comment:  no wine recently  . Drug use: No  . Sexual activity: Never  Lifestyle  . Physical activity:    Days per week: Not on file    Minutes per session: Not on file  . Stress: Not on file  Relationships  . Social connections:    Talks on phone: Not on file    Gets together: Not on file    Attends religious service: Not on file    Active member of club or organization: Not on file    Attends meetings of clubs or organizations: Not on file    Relationship status: Not on file  . Intimate partner violence:    Fear of current or ex partner: Not on file  Emotionally abused: Not on file    Physically abused: Not on file    Forced sexual activity: Not on file  Other Topics Concern  . Not on file  Social History Narrative   Lives at [facility] in independent living.     Lost wife 06-2016.     Has no children.  Family: nephews-nices in [place]   Retired from [employer].     Still drives.     Past Surgical History:  Procedure Laterality Date  . CATARACT EXTRACTION Right 10/06/2013  . CORONARY ARTERY BYPASS GRAFT     2 VD;anomalous RCA;post op compliacted by AF  . HIP FRACTURE SURGERY Right 03/2007   Dr. Salvadore Farber  . INGUINAL HERNIA REPAIR Left 2012   Dr Brantley Stage  . KNEE SURGERY Right 1977   repair  . PROSTATE BIOPSY  09/19/2013   [facility]    Family History  Problem Relation Age of Onset  . Stroke Mother   . Pancreatic cancer Father        Deceased, 44  . Breast cancer Sister        Deceased  . Heart disease Sister 23  . Colon cancer Neg Hx   . Prostate cancer Neg Hx     Allergies   Allergen Reactions  . Remeron [Mirtazapine] Other (See Comments)    Excessive Drowsiness    Current Outpatient Medications on File Prior to Visit  Medication Sig Dispense Refill  . apixaban (ELIQUIS) 5 MG TABS tablet Take 1 tablet (5 mg total) by mouth 2 (two) times daily. 60 tablet 5  . aspirin 81 MG tablet Take 81 mg by mouth daily.    . Calcium Carbonate Antacid (TUMS PO) Take 1 tablet by mouth 2 (two) times daily as needed (heartburn). For calcium    . cholecalciferol (VITAMIN D) 1000 UNITS tablet Take 1,000 Units by mouth every morning.      . denosumab (PROLIA) 60 MG/ML SOSY injection Inject 60 mg into the skin every 6 (six) months.    . dronedarone (MULTAQ) 400 MG tablet TAKE ONE TABLET TWICE DAILY WITH A MEAL 60 tablet 8  . folic acid (FOLVITE) 1 MG tablet TAKE ONE (1) TABLET EACH DAY 30 tablet 11  . loratadine (CLARITIN) 10 MG tablet Take 1 tablet (10 mg total) by mouth daily. 30 tablet 11  . Multiple Vitamin (MULITIVITAMIN WITH MINERALS) TABS Take 1 tablet by mouth daily.      . nitroGLYCERIN (NITROSTAT) 0.4 MG SL tablet Place 1 tablet (0.4 mg total) under the tongue every 5 (five) minutes x 3 doses as needed for chest pain. 30 tablet 2  . rosuvastatin (CRESTOR) 10 MG tablet TAKE ONE (1) TABLET EACH DAY 90 tablet 2   No current facility-administered medications on file prior to visit.     BP 130/65 (BP Location: Right Arm, Cuff Size: Normal)   Pulse (!) 55   Temp 98.1 F (36.7 C) (Oral)   Resp 16   Ht 6\' 2"  (1.88 m)   Wt 157 lb 12.8 oz (71.6 kg)   SpO2 98%   BMI 20.26 kg/m    Objective:   Physical Exam  Constitutional: [name] is oriented to person, place, and time. [name] appears well-developed and well-nourished. No distress.  HENT:  Head: Normocephalic and atraumatic.  Cardiovascular: Normal rate and regular rhythm.  No murmur heard. Pulmonary/Chest: Effort normal and breath sounds normal. No respiratory distress. [name] has no wheezes. [name] has no rales.  Abdominal: Soft.  Bowel  sounds are normal. [name] exhibits no distension and no mass. There is no tenderness. There is no guarding.  Musculoskeletal: [name] exhibits no edema.  Neurological: [name] is alert and oriented to person, place, and time.  Skin: Skin is warm and dry.  Psychiatric: [name] has a normal mood and affect. His behavior is normal. Thought content normal.       Assessment:     Chronic nausea- Unchanged despite protonix. [place] Abd 2018 noted biliary sludge. Will repeat US and plan referral to GI for further evaluation.   Plan:

## 2018-06-05 NOTE — Patient Instructions (Addendum)
You should be contacted about your referral to GI and about your follow up ultrasound.  Please call if symptoms worsen, if you develop recurrent vomiting or if you develop abdominal pain.

## 2018-06-06 ENCOUNTER — Encounter: Payer: Self-pay | Admitting: Gastroenterology

## 2018-06-06 DIAGNOSIS — H2512 Age-related nuclear cataract, left eye: Secondary | ICD-10-CM | POA: Diagnosis not present

## 2018-06-06 DIAGNOSIS — H04123 Dry eye syndrome of bilateral lacrimal glands: Secondary | ICD-10-CM | POA: Diagnosis not present

## 2018-06-06 DIAGNOSIS — Z961 Presence of intraocular lens: Secondary | ICD-10-CM | POA: Diagnosis not present

## 2018-06-06 DIAGNOSIS — H26491 Other secondary cataract, right eye: Secondary | ICD-10-CM | POA: Diagnosis not present

## 2018-06-08 ENCOUNTER — Ambulatory Visit (HOSPITAL_BASED_OUTPATIENT_CLINIC_OR_DEPARTMENT_OTHER)
Admission: RE | Admit: 2018-06-08 | Discharge: 2018-06-08 | Disposition: A | Discharge status: Home / Self Care | Payer: Medicare Other | Source: Ambulatory Visit | Attending: Family | Admitting: Family

## 2018-06-08 DIAGNOSIS — R110 Nausea: Secondary | ICD-10-CM | POA: Insufficient documentation

## 2018-06-11 DIAGNOSIS — H2512 Age-related nuclear cataract, left eye: Secondary | ICD-10-CM | POA: Diagnosis not present

## 2018-06-17 DIAGNOSIS — H2512 Age-related nuclear cataract, left eye: Secondary | ICD-10-CM | POA: Diagnosis not present

## 2018-07-16 ENCOUNTER — Ambulatory Visit (INDEPENDENT_AMBULATORY_CARE_PROVIDER_SITE_OTHER): Payer: Medicare Other | Admitting: Gastroenterology

## 2018-07-16 ENCOUNTER — Encounter: Payer: Self-pay | Admitting: Gastroenterology

## 2018-07-16 ENCOUNTER — Telehealth: Payer: Self-pay

## 2018-07-16 VITALS — BP 104/58 | HR 66 | Ht 74.0 in | Wt 156.2 lb

## 2018-07-16 DIAGNOSIS — R634 Abnormal weight loss: Secondary | ICD-10-CM

## 2018-07-16 DIAGNOSIS — R110 Nausea: Secondary | ICD-10-CM | POA: Diagnosis not present

## 2018-07-16 MED ORDER — ONDANSETRON 4 MG PO TBDP: 4.0000 mg | ORAL_TABLET | Freq: Three times a day (TID) | ORAL | 0 refills | Status: DC | PRN

## 2018-07-16 NOTE — Patient Instructions (Signed)
If you are age 82 or older, your body mass index should be between 23-30. Your Body mass index is 20.06 kg/m. If this is out of the aforementioned range listed, please consider follow up with your Primary Care Provider.  If you are age 93 or younger, your body mass index should be between 19-25. Your Body mass index is 20.06 kg/m. If this is out of the aformentioned range listed, please consider follow up with your Primary Care Provider.   You have been scheduled for an endoscopy. Please follow written instructions given to you at your visit today. If you use inhalers (even only as needed), please bring them with you on the day of your procedure. Your physician has requested that you go to www.startemmi.com and enter the access code given to you at your visit today. This web site gives a general overview about your procedure. However, you should still follow specific instructions given to you by our office regarding your preparation for the procedure.  We have sent the following medications to your pharmacy for you to pick up at your convenience: Zofran ODT 4mg  by mouth as needed.  Thank you,  Dr. Jackquline Denmark

## 2018-07-16 NOTE — Progress Notes (Signed)
Chief Complaint: Chronic nausea  Referring Provider:  Debbrah Alar, NP      ASSESSMENT AND PLAN;   #1. Chronic nausea-likely due to medications like Multaq, rule out other causes.  Negative [place], normal CBC, CMP, TSH and PSA 2019 - Zofran 4mg  ODT only prn.  Patient will only use it on as needed basis since he has significant history of cardiac problems. -Proceed with EGD for further evaluation. - Cardiology clearence to hold eliquis prior to EGD.  #2. Weight Loss (5lb) Check weight every week and bring recordings to the next visit.  Patient is to report only if there is continued weight loss. If continued weight loss, will proceed with CT scan of the abdomen and pelvis with p.o. and IV contrast. I have discussed above in detail with the patient.  All the contact numbers were given.  Patient knows how to get in touch with [place].  #3.  Multiple comorbid conditions including A. fib, COPD, CAD, anxiety and advanced age.    HPI:    [name] is a 84 y.o. male  With history of long-standing nausea Occurs mostly around 1030 to 11 AM almost every day.  He takes Multaq at around 7-8 AM. He does not have nausea after taking evening dose as he goes to bed after. Denies having any significant vomiting Does not feel like eating at that time Has lost 5 pounds over the last 6 months Denies having any heartburn, odynophagia, dysphagia, melena or hematochezia. First thought that the nausea is because of eye problems-negative ophthalmology evaluation Denies having any dizziness or vertigo. Denies any any significant diarrhea or constipation.  No abdominal pain. Zofran did help.  He would like to use it on as-needed basis.   Past Medical History:  Diagnosis Date  . Anxiety   . Atrial fibrillation ([place])   . CAD    a. 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  [name] Coronary artery disease   . Diverticulosis 2011   Diverticulitis 2004  . Esophageal reflux   . Headache    saw neuro 4-16, MRI done  . HYPERPLASIA, PROSTATE NOS W/URINARY OBST/LUTS   . IBS   . INGUINAL HERNIA   . Lichen planus [id number]   [name]  . LUMBAR RADICULOPATHY, RIGHT   . Mixed hyperlipidemia   . OSTEOPOROSIS   . PONV (postoperative nausea and vomiting)   . PREMATURE VENTRICULAR CONTRACTIONS    a. After CABG - was on Coumadin/Multaq for a period of time. Coumadin discontinued 04/2010 after maintaining NSR.  [name] UNSPECIFIED ANEMIA     Past Surgical History:  Procedure Laterality Date  . CATARACT EXTRACTION Right 10/06/2013  . CORONARY ARTERY BYPASS GRAFT     2 VD;anomalous RCA;post op compliacted by AF  . HIP FRACTURE SURGERY Right 03/2007   Dr. Salvadore Farber  . INGUINAL HERNIA REPAIR Left 2012   Dr Brantley Stage  . KNEE SURGERY Right 1977   repair  . PROSTATE BIOPSY  09/19/2013   [facility]    Family History  Problem Relation Age of Onset  . Stroke Mother   . Pancreatic cancer Father        Deceased, 81  . Breast cancer Sister        Deceased  . Heart disease Sister 46  . Stroke Maternal Aunt   . Colon cancer Neg Hx   . Prostate cancer Neg Hx  Social History   Tobacco Use  . Smoking status: Former Smoker    Types: Cigarettes    Last attempt to quit: 11/27/1962    Years since quitting: 55.6  . Smokeless tobacco: Never Used  Substance Use Topics  . Alcohol use: Yes    Alcohol/week: 0.0 standard drinks    Comment:  no wine recently  . Drug use: No    Current Outpatient Medications  Medication Sig Dispense Refill  . apixaban (ELIQUIS) 5 MG TABS tablet Take 1 tablet (5 mg total) by mouth 2 (two) times daily. 60 tablet 5  . aspirin 81 MG tablet Take 81 mg by mouth daily.    . Calcium Carbonate Antacid (TUMS PO) Take 1 tablet by mouth 2 (two) times daily as needed (heartburn). For calcium    . cholecalciferol (VITAMIN D) 1000 UNITS tablet Take 1,000 Units by mouth every morning.      . denosumab  (PROLIA) 60 MG/ML SOSY injection Inject 60 mg into the skin every 6 (six) months.    . dronedarone (MULTAQ) 400 MG tablet TAKE ONE TABLET TWICE DAILY WITH A MEAL 60 tablet 8  . folic acid (FOLVITE) 1 MG tablet TAKE ONE (1) TABLET EACH DAY 30 tablet 11  . loratadine (CLARITIN) 10 MG tablet Take 1 tablet (10 mg total) by mouth daily. (Patient taking differently: Take 10 mg by mouth as needed. ) 30 tablet 11  . Multiple Vitamin (MULITIVITAMIN WITH MINERALS) TABS Take 1 tablet by mouth daily.      . rosuvastatin (CRESTOR) 10 MG tablet TAKE ONE (1) TABLET EACH DAY 90 tablet 2  . nitroGLYCERIN (NITROSTAT) 0.4 MG SL tablet Place 1 tablet (0.4 mg total) under the tongue every 5 (five) minutes x 3 doses as needed for chest pain. (Patient not taking: Reported on 07/16/2018) 30 tablet 2   No current facility-administered medications for this visit.     Allergies  Allergen Reactions  . Amoxicillin Other (See Comments)    REACTION: n/v/d  . Zoloft [Sertraline Hcl]     Patient didn't like the way it made him feel  . Remeron [Mirtazapine] Other (See Comments)    Excessive Drowsiness    Review of Systems:  Constitutional: Denies fever, chills, diaphoresis, appetite change and fatigue.  HEENT: Denies photophobia, eye pain, redness, hearing loss, ear pain, congestion, sore throat, rhinorrhea, sneezing, mouth sores, neck pain, neck stiffness and tinnitus.  Had cataract surgery Respiratory: Denies SOB, DOE, cough, chest tightness,  and wheezing.   Cardiovascular: Denies chest pain, palpitations and leg swelling.  Genitourinary: Denies dysuria, urgency, frequency, hematuria, flank pain and difficulty urinating.  Musculoskeletal: Denies myalgias, back pain, joint swelling, arthralgias and gait problem.  Skin: No rash.  Neurological: Denies dizziness, seizures, syncope, weakness, light-headedness, numbness and headaches.  Hematological: Denies adenopathy. Easy bruising, personal or family bleeding history    Psychiatric/Behavioral: Has anxiety or depression     Physical Exam:    BP (!) 104/58   Pulse 66   Ht 6\' 2"  (1.88 m)   Wt 156 lb 4 oz (70.9 kg)   BMI 20.06 kg/m  Filed Weights   07/16/18 1334  Weight: 156 lb 4 oz (70.9 kg)   Constitutional:  Well-developed, in no acute distress. Psychiatric: Normal mood and affect. Behavior is normal. HEENT: Pupils normal.  Conjunctivae are normal. No scleral icterus. Neck supple.  Cardiovascular: Normal rate, regular rhythm. No edema Pulmonary/chest: Effort normal and breath sounds normal. No wheezing, rales or rhonchi. Abdominal: Soft, nondistended. Nontender.  Bowel sounds active throughout. There are no masses palpable. No hepatomegaly. Rectal:  defered Neurological: Alert and oriented to person place and time. Skin: Skin is warm and dry. No rashes noted.  Data Reviewed: I have personally reviewed following labs and imaging studies  CBC: CBC Latest Ref Rng & Units 03/27/2018 05/21/2017 06/30/2016  WBC 4.0 - 10.5 K/uL 8.2 8.9 8.8  Hemoglobin 13.0 - 17.0 g/dL 13.8 13.6 13.7  Hematocrit 39.0 - 52.0 % 41.1 40.5 42.0  Platelets 150.0 - 400.0 K/uL 175.0 187.0 186    CMP: CMP Latest Ref Rng & Units 03/27/2018 02/19/2018 07/10/2017  Glucose 70 - 99 mg/dL - 89 88  BUN 6 - 23 mg/dL - 25(H) 20  Creatinine 0.40 - 1.50 mg/dL - 0.88 0.95  Sodium 135 - 145 mEq/L - 138 137  Potassium 3.5 - 5.1 mEq/L - 4.5 4.8  Chloride 96 - 112 mEq/L - 102 102  CO2 19 - 32 mEq/L - 30 30  Calcium 8.4 - 10.5 mg/dL - 9.6 9.6  Total Protein 6.0 - 8.3 g/dL 6.7 - -  Total Bilirubin 0.2 - 1.2 mg/dL 0.5 - -  Alkaline Phos 39 - 117 U/L 47 - -  AST 0 - 37 U/L 19 - -  ALT 0 - 53 U/L 13 - -      Carmell Austria, MD 07/16/2018, 2:02 PM  Cc: Debbrah Alar, NP

## 2018-07-16 NOTE — Telephone Encounter (Signed)
Routing to pharmacy.  Brae Gartman C. Denni France, RN, ANP-C [facility] [address] [place], [place]  [address] [phone]  

## 2018-07-16 NOTE — Telephone Encounter (Signed)
[facility] Pre-operative Risk Assessment     Request for surgical clearance:     Endoscopy Procedure  What type of surgery is being performed?     EGD   When is this surgery scheduled?     09/03/2018 10am  What type of clearance is required ?   Pharmacy  Are there any medications that need to be held prior to surgery and how long? Eliquis  Practice name and name of physician performing surgery?      [facility]    Dr. Lyndel Safe  What is your office phone and fax number?      Phone- [phone]  [phone]  Anesthesia type (None, local, MAC, general) ?       MAC

## 2018-07-17 NOTE — Telephone Encounter (Signed)
Called to notify patient of orders to hold Eliquis for 24 hours prior to procedure and he asked me to cancel his procedure until a later date. He would like to talk to his PCP and Cardiology before proceeding.

## 2018-07-17 NOTE — Telephone Encounter (Signed)
Patient with diagnosis of Afib on Eliquis for anticoagulation.    Procedure: EGD Date of procedure: 09/03/18  CHADS2-VASc score of  3 (CHF, HTN, AGE, DM2, stroke/tia x 2, CAD, AGE, male)  CrCl 26ml/min  Per office protocol, patient can hold Eliquis for 24 hours prior to procedure.

## 2018-08-12 NOTE — Progress Notes (Signed)
Patient ID: [name], male   DOB: Oct 29, 1930, 84 y.o.   MRN: [id number]   84 y.o. with CABG 2011 non ischemic myovue 09/08/15. PAF post CABG and recurred 2015 with GI illness and placed on Eliquis and Multaq. History of anemia improved with Folate. Wife passed in 2017 and he is at [facility] now. Tends toward bradycardia and Toprol stopped in 2015  Myovue 09/08/15 reviewed low risk   Nuclear stress EF: 62%.  The left ventricular ejection fraction is normal (55-65%).  1 mm of Horizontal ST segment depression was noted during stress in the II, III, aVF, V6, V5 and V4 leads.  Defect 1: There is a moderate sized defect of mild severity present in the basal inferoseptal, basal inferior, basal inferolateral, apical anterior and apical septal location. This is a fixed defect and in the setting of normal LVF this is most consistent with diaphragamatic attenuation artifact.  This is a low risk study.   In ER 08/16/18 with chest pain Indigestion in epigastric region for a week and related to vomiting and nausea Rx with carafate  CT abdomen non acute    ROS: Denies fever, malais, weight loss, blurry vision, decreased visual acuity, cough, sputum, SOB, hemoptysis, pleuritic pain, palpitaitons, heartburn, abdominal pain, melena, lower extremity edema, claudication, or rash.  All other systems reviewed and negative  General: BP 126/68   Pulse 61   Ht 6\' 2"  (1.88 m)   Wt 146 lb 12 oz (66.6 kg)   SpO2 98%   BMI 18.84 kg/m  Affect appropriate Healthy:  appears stated age 84: normal Neck supple with no adenopathy JVP normal no bruits no thyromegaly Lungs clear with no wheezing and good diaphragmatic motion Heart:  S1/S2 no murmur, no rub, gallop or click PMI normal post sternotomy  Abdomen: benighn, BS positve, no tenderness, no AAA no bruit.  No HSM or HJR Distal pulses intact with no bruits No edema Neuro non-focal Skin warm and dry No muscular weakness    Current Outpatient  Medications  Medication Sig Dispense Refill  . apixaban (ELIQUIS) 5 MG TABS tablet Take 1 tablet (5 mg total) by mouth 2 (two) times daily. 60 tablet 5  . aspirin 81 MG tablet Take 81 mg by mouth daily.    . Calcium Carbonate Antacid (TUMS PO) Take 1 tablet by mouth 2 (two) times daily as needed (heartburn). For calcium    . cholecalciferol (VITAMIN D) 1000 UNITS tablet Take 1,000 Units by mouth every morning.      . denosumab (PROLIA) 60 MG/ML SOSY injection Inject 60 mg into the skin every 6 (six) months.    . dronedarone (MULTAQ) 400 MG tablet TAKE ONE TABLET TWICE DAILY WITH A MEAL 60 tablet 8  . folic acid (FOLVITE) 1 MG tablet Take 1 mg by mouth as directed.    . loratadine (CLARITIN) 10 MG tablet Take 10 mg by mouth as directed.    . Multiple Vitamin (MULITIVITAMIN WITH MINERALS) TABS Take 1 tablet by mouth daily.      . nitroGLYCERIN (NITROSTAT) 0.4 MG SL tablet Place 1 tablet (0.4 mg total) under the tongue every 5 (five) minutes x 3 doses as needed for chest pain. 30 tablet 2  . ondansetron (ZOFRAN ODT) 4 MG disintegrating tablet Take 1 tablet (4 mg total) by mouth every 8 (eight) hours as needed for nausea or vomiting. 12 tablet 0  . pantoprazole (PROTONIX) 40 MG tablet Take 1 tablet (40 mg total) by mouth daily. [place]  tablet 3  . rosuvastatin (CRESTOR) 10 MG tablet Take 10 mg by mouth as directed.    . sucralfate (CARAFATE) 1 GM/10ML suspension Take 10 mLs (1 g total) by mouth 4 (four) times daily -  with meals and at bedtime. 420 mL 0   No current facility-administered medications for this visit.     Allergies  Zoloft [sertraline hcl] and Remeron [mirtazapine]  Electrocardiogram:  4/15  SB rate 45  ICRBBB  Toprol stopped  09/26/17 SR rate 54 ICRBBB   Assessment and Plan CAD/CABG:  2011   Seen in ER for chest pain that is likely GI in nature but given 84 yo grafts will Have him come back for Lexiscan myovue   PAF: Maint NSR no palpitaitons continue multaq and eliquis No beta  blockers due to bradycardia  GERD continue protonix concern for weight loss CT abdomen negative FU GI ok to hold eliquis 2 days before any EGD   Chol  Cholesterol is at goal.  Continue current dose of statin and diet Rx.  No myalgias or side effects.  F/U  LFT's in 6 months. Lab Results  Component Value Date   LDLCALC 25 08/23/2017    Anemia:  Improved on folate  Lab Results  Component Value Date   HCT 40.1 08/16/2018   Osteoporosis:  Active on fosamax f/u primary   Jenkins Rouge

## 2018-08-14 ENCOUNTER — Ambulatory Visit: Payer: Self-pay | Admitting: Internal Medicine

## 2018-08-14 ENCOUNTER — Encounter: Payer: Self-pay | Admitting: Family

## 2018-08-14 ENCOUNTER — Ambulatory Visit (INDEPENDENT_AMBULATORY_CARE_PROVIDER_SITE_OTHER): Payer: Medicare Other | Admitting: Family

## 2018-08-14 VITALS — BP 124/76 | Temp 97.5°F | Resp 60 | Ht 74.0 in | Wt 151.8 lb

## 2018-08-14 DIAGNOSIS — H43392 Other vitreous opacities, left eye: Secondary | ICD-10-CM | POA: Diagnosis not present

## 2018-08-14 DIAGNOSIS — R110 Nausea: Secondary | ICD-10-CM

## 2018-08-14 DIAGNOSIS — R634 Abnormal weight loss: Secondary | ICD-10-CM | POA: Diagnosis not present

## 2018-08-14 DIAGNOSIS — R0789 Other chest pain: Secondary | ICD-10-CM | POA: Diagnosis not present

## 2018-08-14 LAB — COMPREHENSIVE METABOLIC PANEL
ALT: 14 U/L (ref 0–53)
AST: 21 U/L (ref 0–37)
Albumin: 4.3 g/dL (ref 3.5–5.2)
Alkaline Phosphatase: 45 U/L (ref 39–117)
BILIRUBIN TOTAL: 0.7 mg/dL (ref 0.2–1.2)
BUN: 14 mg/dL (ref 6–23)
CO2: 29 meq/L (ref 19–32)
CREATININE: 0.88 mg/dL (ref 0.40–1.50)
Calcium: 9.3 mg/dL (ref 8.4–10.5)
Chloride: 100 mEq/L (ref 96–112)
GFR: 87.3 mL/min (ref >60.00)
GLUCOSE: 99 mg/dL (ref 70–99)
Potassium: 4.6 mEq/L (ref 3.5–5.1)
SODIUM: 136 meq/L (ref 135–145)
Total Protein: 7 g/dL (ref 6.0–8.3)

## 2018-08-14 LAB — CK TOTAL AND CKMB (NOT AT ARMC)
CK, MB: 3.2 ng/mL (ref 0–5.0)
Relative Index: 2.8 (ref 0–4.0)
Total CK: 116 U/L (ref 44–196)

## 2018-08-14 LAB — TSH: TSH: 2.44 u[IU]/mL (ref 0.35–4.50)

## 2018-08-14 LAB — TROPONIN I: Troponin I: 0.02 ng/mL (ref <=0.0)

## 2018-08-14 MED ORDER — PANTOPRAZOLE SODIUM 40 MG PO TBEC: 40.0000 mg | DELAYED_RELEASE_TABLET | Freq: Every day | ORAL | 3 refills | Status: DC

## 2018-08-14 NOTE — Progress Notes (Signed)
Subjective:    Patient ID: [name], male    DOB: [birth year], 83 y.o.   MRN: [id number]  HPI  Patient is an 84 yr old male who presents today with chief complaint of GI upset.    Reports that last Saturday he had nausea.  Vomited x 1 that day.  Vomited bilious emesis.  Was then able to tolerate PO's until Saturday night.  Reports that Sunday he felt better, felt ok Monday and Tuesday until lunch. Then he developed some burning up in his chest.  Took some Tums and protonix and symptoms resolved. Then 8PM that night he had some epigastric pressure which radiated to his back. Felt like he needed to belch. Called the RN at his ALF reports oxygen, BP all ok and he was afebrile.  Had some gingerale and crackers.  30 minutes later symptoms resolved.   I saw him back in July with similar GI symptoms and abdominal US was negative. He was referred to GI.   Earlier today he noted a "funny feeling" in his chest, like a discomfort. Reports no current symptoms.  Of note, he saw Dr. Lyndel Safe (GI) back in august due to c/o chronic nausea.  He recommended EGD.  Reports that EGD was scheduled for 10/8 and he did not want ot wait that long and opted to cancel.  He also was concerned about the procedure since he lives alone.    Weight loss-  Wt Readings from Last 3 Encounters:  08/14/18 151 lb 12.8 oz (68.9 kg)  07/16/18 156 lb 4 oz (70.9 kg)  06/05/18 157 lb 12.8 oz (71.6 kg)   Weight was 160 02/19/18   Reports that he has some floaters in the left eye, had recent cataract surgery with Dr. Katy Fitch.   Had some chest pain earlier  Review of Systems See HPI  Past Medical History:  Diagnosis Date  . Anxiety   . Atrial fibrillation ([place])   . CAD    a. 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  [name] Coronary artery disease   . Diverticulosis 2011   Diverticulitis 2004  . Esophageal reflux   . Headache    saw neuro 4-16, MRI done    . HYPERPLASIA, PROSTATE NOS W/URINARY OBST/LUTS   . IBS   . INGUINAL HERNIA   . Lichen planus 1540   [name]  . LUMBAR RADICULOPATHY, RIGHT   . Mixed hyperlipidemia   . OSTEOPOROSIS   . PONV (postoperative nausea and vomiting)   . PREMATURE VENTRICULAR CONTRACTIONS    a. After CABG - was on Coumadin/Multaq for a period of time. Coumadin discontinued 04/2010 after maintaining NSR.  [name] UNSPECIFIED ANEMIA      Social History   Socioeconomic History  . Marital status: Widowed    Spouse name: Not on file  . Number of children: 0  . Years of education: Not on file  . Highest education level: Not on file  Occupational History  . Occupation: retired    Fish farm manager: RETIRED  Social Needs  . Financial resource strain: Not on file  . Food insecurity:    Worry: Not on file    Inability: Not on file  . Transportation needs:    Medical: Not on file    Non-medical: Not on file  Tobacco Use  . Smoking status: Former Smoker    Types: Cigarettes    Last attempt to  quit: 11/27/1962    Years since quitting: 55.7  . Smokeless tobacco: Never Used  Substance and Sexual Activity  . Alcohol use: Yes    Alcohol/week: 0.0 standard drinks    Comment:  no wine recently  . Drug use: No  . Sexual activity: Never  Lifestyle  . Physical activity:    Days per week: Not on file    Minutes per session: Not on file  . Stress: Not on file  Relationships  . Social connections:    Talks on phone: Not on file    Gets together: Not on file    Attends religious service: Not on file    Active member of club or organization: Not on file    Attends meetings of clubs or organizations: Not on file    Relationship status: Not on file  . Intimate partner violence:    Fear of current or ex partner: Not on file    Emotionally abused: Not on file    Physically abused: Not on file    Forced sexual activity: Not on file  Other Topics Concern  . Not on file  Social History Narrative   Lives at  [facility] independent living.     Lost wife 06-2016.     Has no children.  Family: nephews-nices in [place]   Retired from [employer].     Still drives.     Past Surgical History:  Procedure Laterality Date  . CATARACT EXTRACTION Right 10/06/2013  . CORONARY ARTERY BYPASS GRAFT     2 VD;anomalous RCA;post op compliacted by AF  . HIP FRACTURE SURGERY Right 03/2007   Dr. Salvadore Farber  . INGUINAL HERNIA REPAIR Left 2012   Dr Brantley Stage  . KNEE SURGERY Right 1977   repair  . PROSTATE BIOPSY  09/19/2013   [facility]    Family History  Problem Relation Age of Onset  . Stroke Mother   . Pancreatic cancer Father        Deceased, 58  . Breast cancer Sister        Deceased  . Heart disease Sister 69  . Stroke Maternal Aunt   . Colon cancer Neg Hx   . Prostate cancer Neg Hx     Allergies  Allergen Reactions  . Amoxicillin Other (See Comments)    REACTION: n/v/d  . Zoloft [Sertraline Hcl]     Patient didn't like the way it made him feel  . Remeron [Mirtazapine] Other (See Comments)    Excessive Drowsiness    Current Outpatient Medications on File Prior to Visit  Medication Sig Dispense Refill  . apixaban (ELIQUIS) 5 MG TABS tablet Take 1 tablet (5 mg total) by mouth 2 (two) times daily. 60 tablet 5  . aspirin 81 MG tablet Take 81 mg by mouth daily.    . Calcium Carbonate Antacid (TUMS PO) Take 1 tablet by mouth 2 (two) times daily as needed (heartburn). For calcium    . cholecalciferol (VITAMIN D) 1000 UNITS tablet Take 1,000 Units by mouth every morning.      . denosumab (PROLIA) 60 MG/ML SOSY injection Inject 60 mg into the skin every 6 (six) months.    . dronedarone (MULTAQ) 400 MG tablet TAKE ONE TABLET TWICE DAILY WITH A MEAL 60 tablet 8  . folic acid (FOLVITE) 1 MG tablet TAKE ONE (1) TABLET EACH DAY 30 tablet 11  . loratadine (CLARITIN) 10 MG tablet Take 1 tablet (10 mg total) by mouth daily. (Patient taking  differently: Take 10 mg by mouth as needed. ) 30  tablet 11  . Multiple Vitamin (MULITIVITAMIN WITH MINERALS) TABS Take 1 tablet by mouth daily.      . nitroGLYCERIN (NITROSTAT) 0.4 MG SL tablet Place 1 tablet (0.4 mg total) under the tongue every 5 (five) minutes x 3 doses as needed for chest pain. 30 tablet 2  . ondansetron (ZOFRAN ODT) 4 MG disintegrating tablet Take 1 tablet (4 mg total) by mouth every 8 (eight) hours as needed for nausea or vomiting. 20 tablet 0  . rosuvastatin (CRESTOR) 10 MG tablet TAKE ONE (1) TABLET EACH DAY 90 tablet 2   No current facility-administered medications on file prior to visit.     BP 124/76 (BP Location: Right Arm, Cuff Size: Normal)   Temp (!) 97.5 F (36.4 C) (Oral)   Resp (!) 60   Ht 6\' 2"  (1.88 m)   Wt 151 lb 12.8 oz (68.9 kg)   SpO2 98%   BMI 19.49 kg/m       Objective:   Physical Exam  Constitutional: He is oriented to person, place, and time. No distress.  Thin white elderly male   HENT:  Head: Normocephalic and atraumatic.  Cardiovascular: Normal rate and regular rhythm.  No murmur heard. Pulmonary/Chest: Effort normal and breath sounds normal. No respiratory distress. He has no wheezes. He has no rales.  Abdominal: Soft. He exhibits no distension. There is no tenderness. There is no guarding.  Musculoskeletal: He exhibits no edema.  Neurological: He is alert and oriented to person, place, and time.  Skin: Skin is warm and dry.  Psychiatric: He has a normal mood and affect. His behavior is normal. Thought content normal.          Assessment & Plan:  Chronic nausea/weight loss- could be gerd related, H pylori, PUD, underlying malignancy.    I have advised the following:    CT abd/pelvis Restart protonix Reschedule EGD with GI Obtain labs (CBC, CMET, TSH) Follow up with PCP in 2 weeks.  Atypical chest pain- I think this is mostly likely of GI origin but he does have hx of CAD s/p CABG.  EKG tracing is personally reviewed.  EKG notes NSR (incomplete RBBB).  No acute  changes. He is advised to call 911 if recurrent chest pain. He is already scheduled to follow up with his cardiologist on Monday and I have advised him to keep this appointment. Will obtain cardiac enzymes today to be complete, though I expect them to be negative.  He will get his flu shot at [facility], declines here today.   Floaters- reports left eye only, better today that they were a few days ago. Advised pt to follow up with his ophthalmologist- Dr. Katy Fitch.

## 2018-08-14 NOTE — Telephone Encounter (Signed)
Pt called with history of nausea and vomiting his past weekend. Pt stated that yesterday evening he felt chest pain that he stated felt like it was more close to his back that the front part of the chest. Pt stated he went to the nurses station and his vital signs were within normal limits. Pt stated that the nurse gave him some crackers and ginger ale and it caused him to burp. After burping, he stated that he felt much better. Pt denies shortness of breath, chest pain, nausea and vomiting at the time of the call. Pt denied dizziness, sweating, difficulty breathing.  Pt was referred to GI specialist who recommended endoscopy and ordered Zofran for nausea. Pt stated he took only half a dose for the nausea. Pt advised to take the whole 4 mg sublingual tablet next time.  Pt stated that he doesn't feel ill until after he eats. Care advice given to pt and pt verbalized understanding. Pt has appointment today at 12:00 noon with Debbrah Alar today.  Reason for Disposition . [1] Patient claims chest pain is same as previously diagnosed "heartburn" AND [2] describes burning in chest AND [3] accompanying sour taste in mouth  Answer Assessment - Initial Assessment Questions 1. LOCATION: "Where does it hurt?"       No pain  2. RADIATION: "Does the pain go anywhere else?" (e.g., into neck, jaw, arms, back)     n/a 3. ONSET: "When did the chest pain begin?" (Minutes, hours or days)      Yesterday after eating  4. PATTERN "Does the pain come and go, or has it been constant since it started?"  "Does it get worse with exertion?"      Comes after eating 5. DURATION: "How long does it last" (e.g., seconds, minutes, hours)     1 1/2 hours 6. SEVERITY: "How bad is the pain?"  (e.g., Scale 1-10; mild, moderate, or severe)    - MILD (1-3): doesn't interfere with normal activities     - MODERATE (4-7): interferes with normal activities or awakens from sleep    - SEVERE (8-10): excruciating pain, unable to do any  normal activities       Mild pt thinks that it is indigestion  7. CARDIAC RISK FACTORS: "Do you have any history of heart problems or risk factors for heart disease?" (e.g., prior heart attack, angina; high blood pressure, diabetes, being overweight, high cholesterol, smoking, or strong family history of heart disease)     A fib and bypass surgery in 2011, family h/o heart disease on Mother's side 8. PULMONARY RISK FACTORS: "Do you have any history of lung disease?"  (e.g., blood clots in lung, asthma, emphysema, birth control pills)     no 9. CAUSE: "What do you think is causing the chest pain?"     indigestion 10. OTHER SYMPTOMS: "Do you have any other symptoms?" (e.g., dizziness, nausea, vomiting, sweating, fever, difficulty breathing, cough)       Nausea and vomiting Saturday.  11. PREGNANCY: "Is there any chance you are pregnant?" "When was your last menstrual period?"       n/a  Protocols used: CHEST PAIN-A-AH

## 2018-08-14 NOTE — Patient Instructions (Addendum)
Pleases complete lab work prior to leaving. You should be contacted about your referral to cardiology. Call 911 if you develop recurrent chest pain symptoms. Please restart protonix. Schedule a follow up appointment with Dr. Katy Fitch to discuss the floaters in your left eye.  Please call [facility] to reschedule your endoscopy to check your stomach.  You should be contacted about scheduling the CT of your abdomen.

## 2018-08-16 ENCOUNTER — Emergency Department (HOSPITAL_COMMUNITY): Payer: Medicare Other

## 2018-08-16 ENCOUNTER — Emergency Department (HOSPITAL_COMMUNITY)
Admission: EM | Admit: 2018-08-16 | Discharge: 2018-08-16 | Disposition: A | Discharge status: Home / Self Care | Payer: Medicare Other | Attending: Emergency Medicine | Admitting: Emergency Medicine

## 2018-08-16 ENCOUNTER — Other Ambulatory Visit: Payer: Self-pay

## 2018-08-16 ENCOUNTER — Encounter (HOSPITAL_COMMUNITY): Payer: Self-pay | Admitting: Emergency Medicine

## 2018-08-16 DIAGNOSIS — Z7901 Long term (current) use of anticoagulants: Secondary | ICD-10-CM | POA: Insufficient documentation

## 2018-08-16 DIAGNOSIS — R0902 Hypoxemia: Secondary | ICD-10-CM | POA: Diagnosis not present

## 2018-08-16 DIAGNOSIS — R1013 Epigastric pain: Secondary | ICD-10-CM

## 2018-08-16 DIAGNOSIS — Z7982 Long term (current) use of aspirin: Secondary | ICD-10-CM | POA: Insufficient documentation

## 2018-08-16 DIAGNOSIS — Z951 Presence of aortocoronary bypass graft: Secondary | ICD-10-CM | POA: Insufficient documentation

## 2018-08-16 DIAGNOSIS — J449 Chronic obstructive pulmonary disease, unspecified: Secondary | ICD-10-CM | POA: Insufficient documentation

## 2018-08-16 DIAGNOSIS — Z87891 Personal history of nicotine dependence: Secondary | ICD-10-CM | POA: Diagnosis not present

## 2018-08-16 DIAGNOSIS — K5730 Diverticulosis of large intestine without perforation or abscess without bleeding: Secondary | ICD-10-CM | POA: Diagnosis not present

## 2018-08-16 DIAGNOSIS — J181 Lobar pneumonia, unspecified organism: Secondary | ICD-10-CM | POA: Diagnosis not present

## 2018-08-16 DIAGNOSIS — R110 Nausea: Secondary | ICD-10-CM | POA: Diagnosis not present

## 2018-08-16 DIAGNOSIS — I2510 Atherosclerotic heart disease of native coronary artery without angina pectoris: Secondary | ICD-10-CM | POA: Diagnosis not present

## 2018-08-16 DIAGNOSIS — Z79899 Other long term (current) drug therapy: Secondary | ICD-10-CM | POA: Diagnosis not present

## 2018-08-16 DIAGNOSIS — R079 Chest pain, unspecified: Secondary | ICD-10-CM | POA: Diagnosis not present

## 2018-08-16 DIAGNOSIS — K30 Functional dyspepsia: Secondary | ICD-10-CM | POA: Diagnosis not present

## 2018-08-16 DIAGNOSIS — R0789 Other chest pain: Secondary | ICD-10-CM | POA: Diagnosis not present

## 2018-08-16 LAB — URINALYSIS, ROUTINE W REFLEX MICROSCOPIC
Bacteria, UA: NONE SEEN
Bilirubin Urine: NEGATIVE
Glucose, UA: NEGATIVE mg/dL
KETONES UR: 5 mg/dL — AB
LEUKOCYTES UA: NEGATIVE
NITRITE: NEGATIVE
PROTEIN: NEGATIVE mg/dL
Specific Gravity, Urine: 1.005 (ref 1.005–1.030)
pH: 6 (ref 5.0–8.0)

## 2018-08-16 LAB — CBC WITH DIFFERENTIAL/PLATELET
Abs Immature Granulocytes: 0 10*3/uL (ref 0.0–0.1)
BASOS ABS: 0.1 10*3/uL (ref 0.0–0.1)
BASOS PCT: 1 %
EOS PCT: 4 %
Eosinophils Absolute: 0.3 10*3/uL (ref 0.0–0.7)
HCT: 40.1 % (ref 39.0–52.0)
HEMOGLOBIN: 13.2 g/dL (ref 13.0–17.0)
Immature Granulocytes: 0 %
LYMPHS PCT: 18 %
Lymphs Abs: 1.6 10*3/uL (ref 0.7–4.0)
MCH: 30.8 pg (ref 26.0–34.0)
MCHC: 32.9 g/dL (ref 30.0–36.0)
MCV: 93.5 fL (ref 78.0–100.0)
MONO ABS: 0.7 10*3/uL (ref 0.1–1.0)
MONOS PCT: 8 %
Neutro Abs: 5.9 10*3/uL (ref 1.7–7.7)
Neutrophils Relative %: 69 %
Platelets: 200 10*3/uL (ref 150–400)
RBC: 4.29 MIL/uL (ref 4.22–5.81)
RDW: 15.7 % — ABNORMAL HIGH (ref 11.5–15.5)
WBC: 8.5 10*3/uL (ref 4.0–10.5)

## 2018-08-16 LAB — COMPREHENSIVE METABOLIC PANEL
ALK PHOS: 40 U/L (ref 38–126)
ALT: 18 U/L (ref 0–44)
ANION GAP: 11 (ref 5–15)
AST: 26 U/L (ref 15–41)
Albumin: 3.8 g/dL (ref 3.5–5.0)
BILIRUBIN TOTAL: 0.8 mg/dL (ref 0.3–1.2)
BUN: 13 mg/dL (ref 8–23)
CO2: 23 mmol/L (ref 22–32)
Calcium: 9 mg/dL (ref 8.9–10.3)
Chloride: 100 mmol/L (ref 98–111)
Creatinine, Ser: 1.02 mg/dL (ref 0.61–1.24)
Glucose, Bld: 89 mg/dL (ref 70–99)
Potassium: 4 mmol/L (ref 3.5–5.1)
Sodium: 134 mmol/L — ABNORMAL LOW (ref 135–145)
TOTAL PROTEIN: 6.5 g/dL (ref 6.5–8.1)

## 2018-08-16 LAB — LIPASE, BLOOD: Lipase: 30 U/L (ref 11–51)

## 2018-08-16 LAB — TROPONIN I: Troponin I: <0.03 ng/mL (ref <0.03)

## 2018-08-16 MED ORDER — IOHEXOL 300 MG/ML  SOLN
100.0000 mL | Freq: Once | INTRAMUSCULAR | Status: AC | PRN
  Administered 2018-08-16: 100 mL via INTRAVENOUS

## 2018-08-16 MED ORDER — SUCRALFATE 1 GM/10ML PO SUSP: 1.0000 g | Freq: Three times a day (TID) | ORAL | 0 refills | Status: DC

## 2018-08-16 MED ORDER — ONDANSETRON 4 MG PO TBDP: 4.0000 mg | ORAL_TABLET | Freq: Three times a day (TID) | ORAL | 0 refills | Status: DC | PRN

## 2018-08-16 NOTE — ED Notes (Signed)
Patient transported to X-ray 

## 2018-08-16 NOTE — Discharge Instructions (Addendum)
The cause of your symptoms was not identified today.  You had a CT scan today that showed a pulmonary nodule as well as old compression fracture in your back.  Get rechecked if you have any new or concerning symptoms.

## 2018-08-16 NOTE — ED Triage Notes (Addendum)
Per [facility] pt c/o central/ left sided chest pain onset of 8am today. Pt states he woke up feeling fine, but after breakfast began having pain. Reports seeing doctor earlier this week for indigestion issues, have labs drawn and prescribed protonix. Pt refused aspirin or nitro with EMS. Pt A&Ox4.

## 2018-08-16 NOTE — ED Notes (Signed)
Patient verbalizes understanding of medications and discharge instructions. No further questions at this time. VSS and patient ambulatory at discharge.   

## 2018-08-16 NOTE — ED Notes (Signed)
Patient transported to CT 

## 2018-08-16 NOTE — ED Provider Notes (Signed)
[facility] EMERGENCY DEPARTMENT Provider Note   CSN: [id number] Arrival date & time: 08/16/18  1402     History   Chief Complaint Chief Complaint  Patient presents with  . Chest Pain    HPI [name] is a 84 y.o. male.  The history is provided by the patient. No language interpreter was used.  Chest Pain     [name] is a 84 y.o. male who presents to the Emergency Department complaining of chest pain. He presents to the emergency department complaining of chest pain. When reporting chest pain he describes this is indigestion located in the central/epigastric region. His discomfort has been present for the last week began after an episode of vomiting. No vomiting since Saturday. Pain is pressure like in nature. It was initially waxing and waning but today has been constant. He endorses two months of episodic nausea and has been evaluated by multiple physicians for the symptoms. He denies any fevers, shortness of breath, diaphoresis, diarrhea, constipation, dysuria, leg swelling or pain. He is scheduled to follow-up with cardiology on Monday. Records reviewed in epic. He recently had outpatient labs as well as an abdominal ultrasound performed.  He lives in independent living. Past Medical History:  Diagnosis Date  . Anxiety   . Atrial fibrillation ([place])   . CAD    a. 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  [name] Coronary artery disease   . Diverticulosis 2011   Diverticulitis 2004  . Esophageal reflux   . Headache    saw neuro 4-16, MRI done  . HYPERPLASIA, PROSTATE NOS W/URINARY OBST/LUTS   . IBS   . INGUINAL HERNIA   . Lichen planus [id number]   [name]  . LUMBAR RADICULOPATHY, RIGHT   . Mixed hyperlipidemia   . OSTEOPOROSIS   . PONV (postoperative nausea and vomiting)   . PREMATURE VENTRICULAR CONTRACTIONS    a. After CABG - was on Coumadin/Multaq for a period of time.  Coumadin discontinued 04/2010 after maintaining NSR.  [name] UNSPECIFIED ANEMIA     Patient Active Problem List   Diagnosis Date Noted  . Anxiety and depression 06/22/2016  . COPD (chronic obstructive pulmonary disease) ([place]) 03/25/2016  . Annual physical exam 03/24/2016  . PCP NOTES >>>>>>>>>>>>>> 08/27/2015  . Allergic rhinitis 03/09/2015  . Headache 02/23/2015  . Bradycardia 06/15/2014  . Arthritis, degenerative 06/15/2014  . Hyperglycemia 01/08/2014  . Paroxysmal A-fib ([place]) 12/28/2013  . Diverticulosis of colon without hemorrhage 10/13/2013  . Mixed hyperlipidemia 04/18/2010  . Coronary atherosclerosis 02/25/2010  . PREMATURE VENTRICULAR CONTRACTIONS 02/08/2010  . HIP FRACTURE, RIGHT 12/13/2009  . IBS 09/01/2009  . Weakness 06/28/2009  . LUMBAR RADICULOPATHY, RIGHT 06/09/2009  . Esophageal reflux 09/22/2008  . PALPITATIONS 05/11/2008  . HYPERPLASIA, PROSTATE NOS W/URINARY OBST/LUTS 09/20/2007  . Closed fracture of dorsal (thoracic) vertebra without mention of spinal cord injury 09/20/2007  . Osteoporosis 01/01/2007    Past Surgical History:  Procedure Laterality Date  . CATARACT EXTRACTION Right 10/06/2013  . CORONARY ARTERY BYPASS GRAFT     2 VD;anomalous RCA;post op compliacted by AF  . HIP FRACTURE SURGERY Right 03/2007   Dr. Salvadore Farber  . INGUINAL HERNIA REPAIR Left 2012   Dr Brantley Stage  . KNEE SURGERY Right 1977   repair  . PROSTATE BIOPSY  09/19/2013   [facility]        Home Medications  Prior to Admission medications   Medication Sig Start Date End Date Taking? Authorizing Provider  apixaban (ELIQUIS) 5 MG TABS tablet Take 1 tablet (5 mg total) by mouth 2 (two) times daily. 02/28/18  Yes Josue Hector, MD  aspirin 81 MG tablet Take 81 mg by mouth daily.   Yes [provider]  Calcium Carbonate Antacid (TUMS PO) Take 1 tablet by mouth 2 (two) times daily as needed (heartburn). For calcium   Yes [provider]  cholecalciferol (VITAMIN  D) 1000 UNITS tablet Take 1,000 Units by mouth every morning.     Yes [provider]  denosumab (PROLIA) 60 MG/ML SOSY injection Inject 60 mg into the skin every 6 (six) months.   Yes [provider]  dronedarone (MULTAQ) 400 MG tablet TAKE ONE TABLET TWICE DAILY WITH A MEAL 12/11/17  Yes Josue Hector, MD  folic acid (FOLVITE) 1 MG tablet TAKE ONE (1) TABLET EACH DAY Patient taking differently: Take 1 mg by mouth daily.  10/01/17  Yes Josue Hector, MD  loratadine (CLARITIN) 10 MG tablet Take 1 tablet (10 mg total) by mouth daily. Patient taking differently: Take 10 mg by mouth as needed for allergies.  03/09/17  Yes Paz, Alda Berthold, MD  Multiple Vitamin (MULITIVITAMIN WITH MINERALS) TABS Take 1 tablet by mouth daily.     Yes [provider]  nitroGLYCERIN (NITROSTAT) 0.4 MG SL tablet Place 1 tablet (0.4 mg total) under the tongue every 5 (five) minutes x 3 doses as needed for chest pain. 08/21/17  Yes Paz, Alda Berthold, MD  ondansetron (ZOFRAN ODT) 4 MG disintegrating tablet Take 1 tablet (4 mg total) by mouth every 8 (eight) hours as needed for nausea or vomiting. 07/16/18  Yes Jackquline Denmark, MD  pantoprazole (PROTONIX) 40 MG tablet Take 1 tablet (40 mg total) by mouth daily. 08/14/18  Yes Debbrah Alar, NP  rosuvastatin (CRESTOR) 10 MG tablet TAKE ONE (1) TABLET EACH DAY Patient taking differently: Take 10 mg by mouth every evening.  01/02/18  Yes Josue Hector, MD  ondansetron (ZOFRAN ODT) 4 MG disintegrating tablet Take 1 tablet (4 mg total) by mouth every 8 (eight) hours as needed for nausea or vomiting. 08/16/18   Quintella Reichert, MD  sucralfate (CARAFATE) 1 GM/10ML suspension Take 10 mLs (1 g total) by mouth 4 (four) times daily -  with meals and at bedtime. 08/16/18   Quintella Reichert, MD    Family History Family History  Problem Relation Age of Onset  . Stroke Mother   . Pancreatic cancer Father        Deceased, 80  . Breast cancer Sister        Deceased  .  Heart disease Sister 32  . Stroke Maternal Aunt   . Colon cancer Neg Hx   . Prostate cancer Neg Hx     Social History Social History   Tobacco Use  . Smoking status: Former Smoker    Types: Cigarettes    Last attempt to quit: 11/27/1962    Years since quitting: 55.7  . Smokeless tobacco: Never Used  Substance Use Topics  . Alcohol use: Yes    Alcohol/week: 0.0 standard drinks    Comment:  no wine recently  . Drug use: No     Allergies   Zoloft [sertraline hcl] and Remeron [mirtazapine]   Review of Systems Review of Systems  Cardiovascular: Positive for chest pain.  All other systems reviewed and are negative.  Physical Exam Updated Vital Signs BP (!) 149/66   Pulse (!) 56   Temp 97.8 F (36.6 C) (Oral)   Resp 14   Ht 6\' 2"  (1.88 m)   Wt 68.9 kg   SpO2 100%   BMI 19.49 kg/m   Physical Exam  Constitutional: He is oriented to person, place, and time. He appears well-developed and well-nourished.  HENT:  Head: Normocephalic and atraumatic.  Cardiovascular: Normal rate and regular rhythm.  No murmur heard. Pulmonary/Chest: Effort normal and breath sounds normal. No respiratory distress.  Abdominal: Soft. There is no tenderness. There is no rebound and no guarding.  Musculoskeletal: He exhibits no edema or tenderness.  Neurological: He is alert and oriented to person, place, and time.  Skin: Skin is warm and dry.  Psychiatric: He has a normal mood and affect. His behavior is normal.  Nursing note and vitals reviewed.    ED Treatments / Results  Labs (all labs ordered are listed, but only abnormal results are displayed) Labs Reviewed  COMPREHENSIVE METABOLIC PANEL - Abnormal; Notable for the following components:      Result Value   Sodium 134 (*)    All other components within normal limits  URINALYSIS, ROUTINE W REFLEX MICROSCOPIC - Abnormal; Notable for the following components:   Color, Urine STRAW (*)    Hgb urine dipstick SMALL (*)    Ketones,  ur 5 (*)    All other components within normal limits  CBC WITH DIFFERENTIAL/PLATELET - Abnormal; Notable for the following components:   RDW 15.7 (*)    All other components within normal limits  TROPONIN I  LIPASE, BLOOD    EKG EKG Interpretation  Date/Time:  Friday August 16 2018 14:09:04 EDT Ventricular Rate:  52 PR Interval:    QRS Duration: 114 QT Interval:  439 QTC Calculation: 409 R Axis:   54 Text Interpretation:  Sinus rhythm Borderline intraventricular conduction delay Abnormal R-wave progression, early transition Confirmed by Quintella Reichert [phone]) on 08/16/2018 2:59:09 PM   Radiology Dg Chest 2 View  Result Date: 08/16/2018 CLINICAL DATA:  Left-sided chest pain. EXAM: CHEST - 2 VIEW COMPARISON:  06/30/2016 FINDINGS: Lungs are hyperexpanded. Interstitial markings are diffusely coarsened with chronic features. Small nodular density in the left lung, in a region of architectural distortion, is new in the interval. No pulmonary edema or pleural effusion. Stable midthoracic compression fracture. Telemetry leads overlie the chest. IMPRESSION: 1. Emphysema without acute cardiopulmonary findings. 2. Small nodule left upper lobe. This was not visible on the prior study. CT chest without contrast could be used to further evaluate as clinically warranted. Electronically Signed   By: Misty Stanley M.D.   On: 08/16/2018 16:20   Ct Chest W Contrast  Result Date: 08/16/2018 CLINICAL DATA:  LEFT chest pain beginning this morning, nausea and vomiting. History of reflux disease. EXAM: CT CHEST, ABDOMEN, AND PELVIS WITH CONTRAST TECHNIQUE: Multidetector CT imaging of the chest, abdomen and pelvis was performed following the standard protocol during bolus administration of intravenous contrast. CONTRAST:  124mL OMNIPAQUE IOHEXOL 300 MG/ML  SOLN COMPARISON:  Chest radiograph August 16, 2018 and abdominal ultrasound June 08, 2018 FINDINGS: CT CHEST FINDINGS CARDIOVASCULAR: Heart size is  normal. Severe coronary artery calcifications. Status post CABG. No pericardial effusion. Admixture of contrast RIGHT atrium. Mildly ectatic 3.4 cm ascending aorta. Mild calcific atherosclerosis. MEDIASTINUM/NODES: No mediastinal mass. No lymphadenopathy by CT size criteria. Normal appearance of thoracic esophagus though not tailored for evaluation. LUNGS/PLEURA: Tracheobronchial tree is patent, no  pneumothorax. Biapical reticulonodular scarring. Dependent atelectasis. LEFT upper lobe scarring with subpleural nodularity in bronchiectasis. 9 mm LEFT upper lobe anterior segment subpleural consolidation (series 5, image 44). No pleural effusion. MUSCULOSKELETAL: Osteopenia. Moderate T8, mild T9, mild T10, moderate T12 compression fractures. Status post median sternotomy. CT ABDOMEN AND PELVIS FINDINGS HEPATOBILIARY: Calcified hepatic granuloma, 1 cm flash filling hemangioma LEFT lobe of the liver. Normal gallbladder. PANCREAS: 1 cm cyst or lymphangioma, otherwise unremarkable. SPLEEN: Normal. ADRENALS/URINARY TRACT: Kidneys are orthotopic, demonstrating symmetric enhancement. No nephrolithiasis, hydronephrosis or solid renal masses. The unopacified ureters are normal in course and caliber. Delayed imaging through the kidneys demonstrates symmetric prompt contrast excretion within the proximal urinary collecting system. Urinary bladder is partially distended and unremarkable. Normal adrenal glands. STOMACH/BOWEL: Stomach, small and large bowel are normal in course and caliber without inflammatory changes, sensitivity decreased without oral contrast. Moderate sigmoid colonic diverticulosis. Normal appendix. VASCULAR/LYMPHATIC: Aortoiliac vessels are normal in course and caliber. Mild intimal thickening calcific atherosclerosis. No lymphadenopathy by CT size criteria. REPRODUCTIVE: Mild prostatomegaly. OTHER: No intraperitoneal free fluid or free air. MUSCULOSKELETAL: Osteopenia. Moderate to severe L1, moderate to severe  L2, mild L3, mild L4 and mild-to-moderate L5 compression fractures. RIGHT femur ORIF. Severe lower lumbar facet arthropathy. IMPRESSION: CT CHEST: 1. 9 mm LEFT upper lobe subpleural nodular consolidation seen with pneumonia or, may be, chronic. Biapical and LEFT lung scarring. Consider 3 to six-month follow-up chest CT to verify stability of findings. 2. Osteopenia, multilevel age indeterminate compression fractures, moderate at T8 and T12. CT ABDOMEN AND PELVIS: 1. No acute intra-abdominopelvic process. 2. colonic diverticulosis. 3. Osteopenia, age indeterminate compression fractures all lumbar levels, moderate to severe at L1 and L2. Aortic Atherosclerosis (ICD10-I70.0). Electronically Signed   By: Elon Alas M.D.   On: 08/16/2018 18:57   Ct Abdomen Pelvis W Contrast  Result Date: 08/16/2018 CLINICAL DATA:  LEFT chest pain beginning this morning, nausea and vomiting. History of reflux disease. EXAM: CT CHEST, ABDOMEN, AND PELVIS WITH CONTRAST TECHNIQUE: Multidetector CT imaging of the chest, abdomen and pelvis was performed following the standard protocol during bolus administration of intravenous contrast. CONTRAST:  19mL OMNIPAQUE IOHEXOL 300 MG/ML  SOLN COMPARISON:  Chest radiograph August 16, 2018 and abdominal ultrasound June 08, 2018 FINDINGS: CT CHEST FINDINGS CARDIOVASCULAR: Heart size is normal. Severe coronary artery calcifications. Status post CABG. No pericardial effusion. Admixture of contrast RIGHT atrium. Mildly ectatic 3.4 cm ascending aorta. Mild calcific atherosclerosis. MEDIASTINUM/NODES: No mediastinal mass. No lymphadenopathy by CT size criteria. Normal appearance of thoracic esophagus though not tailored for evaluation. LUNGS/PLEURA: Tracheobronchial tree is patent, no pneumothorax. Biapical reticulonodular scarring. Dependent atelectasis. LEFT upper lobe scarring with subpleural nodularity in bronchiectasis. 9 mm LEFT upper lobe anterior segment subpleural consolidation  (series 5, image 44). No pleural effusion. MUSCULOSKELETAL: Osteopenia. Moderate T8, mild T9, mild T10, moderate T12 compression fractures. Status post median sternotomy. CT ABDOMEN AND PELVIS FINDINGS HEPATOBILIARY: Calcified hepatic granuloma, 1 cm flash filling hemangioma LEFT lobe of the liver. Normal gallbladder. PANCREAS: 1 cm cyst or lymphangioma, otherwise unremarkable. SPLEEN: Normal. ADRENALS/URINARY TRACT: Kidneys are orthotopic, demonstrating symmetric enhancement. No nephrolithiasis, hydronephrosis or solid renal masses. The unopacified ureters are normal in course and caliber. Delayed imaging through the kidneys demonstrates symmetric prompt contrast excretion within the proximal urinary collecting system. Urinary bladder is partially distended and unremarkable. Normal adrenal glands. STOMACH/BOWEL: Stomach, small and large bowel are normal in course and caliber without inflammatory changes, sensitivity decreased without oral contrast. Moderate sigmoid colonic diverticulosis. Normal appendix. VASCULAR/LYMPHATIC:  Aortoiliac vessels are normal in course and caliber. Mild intimal thickening calcific atherosclerosis. No lymphadenopathy by CT size criteria. REPRODUCTIVE: Mild prostatomegaly. OTHER: No intraperitoneal free fluid or free air. MUSCULOSKELETAL: Osteopenia. Moderate to severe L1, moderate to severe L2, mild L3, mild L4 and mild-to-moderate L5 compression fractures. RIGHT femur ORIF. Severe lower lumbar facet arthropathy. IMPRESSION: CT CHEST: 1. 9 mm LEFT upper lobe subpleural nodular consolidation seen with pneumonia or, may be, chronic. Biapical and LEFT lung scarring. Consider 3 to six-month follow-up chest CT to verify stability of findings. 2. Osteopenia, multilevel age indeterminate compression fractures, moderate at T8 and T12. CT ABDOMEN AND PELVIS: 1. No acute intra-abdominopelvic process. 2. colonic diverticulosis. 3. Osteopenia, age indeterminate compression fractures all lumbar  levels, moderate to severe at L1 and L2. Aortic Atherosclerosis (ICD10-I70.0). Electronically Signed   By: Elon Alas M.D.   On: 08/16/2018 18:57    Procedures Procedures (including critical care time)  Medications Ordered in ED Medications  iohexol (OMNIPAQUE) 300 MG/ML solution 100 mL (100 mLs Intravenous Contrast Given 08/16/18 1814)     Initial Impression / Assessment and Plan / ED Course  I have reviewed the triage vital signs and the nursing notes.  Pertinent labs & imaging results that were available during my care of the patient were reviewed by me and considered in my medical decision making (see chart for details).     Patient here for evaluation of nausea and chest discomfort for the last week. He is non-toxic appearing on examination. Presentation is not consistent with ACS, PE, dissection. Labs without significant abnormalities. CT abdomen and pelvis obtained to rule out obstruction or mass lesion. CT chest obtained given his pulmonary nodule found on chest x-ray. CT is negative for acute abnormality other than noted pulmonary nodule. Nodule is not consistent with acute infectious source. Discussed with patient unclear source of nausea and dyspepsia. Providing antiemetic, Carafate. Discussed importance of PCP, G.I. and cardiology follow-up. Return precautions discussed.  He does have multiple compression fractures on his CT scan, he is aware of these.  Final Clinical Impressions(s) / ED Diagnoses   Final diagnoses:  Nausea  Dyspepsia    ED Discharge Orders         Ordered    ondansetron (ZOFRAN ODT) 4 MG disintegrating tablet  Every 8 hours PRN     08/16/18 1916    sucralfate (CARAFATE) 1 GM/10ML suspension  3 times daily with meals & bedtime     08/16/18 1920           Quintella Reichert, MD 08/16/18 2335

## 2018-08-19 ENCOUNTER — Telehealth: Payer: Self-pay

## 2018-08-19 ENCOUNTER — Ambulatory Visit: Payer: Medicare Other | Admitting: Cardiovascular Disease

## 2018-08-19 ENCOUNTER — Encounter: Payer: Self-pay | Admitting: Cardiovascular Disease

## 2018-08-19 VITALS — BP 126/68 | HR 61 | Ht 74.0 in | Wt 146.8 lb

## 2018-08-19 DIAGNOSIS — I2510 Atherosclerotic heart disease of native coronary artery without angina pectoris: Secondary | ICD-10-CM

## 2018-08-19 DIAGNOSIS — R079 Chest pain, unspecified: Secondary | ICD-10-CM | POA: Diagnosis not present

## 2018-08-19 DIAGNOSIS — I480 Paroxysmal atrial fibrillation: Secondary | ICD-10-CM

## 2018-08-19 NOTE — Telephone Encounter (Signed)
Patient has appointment scheduled with Dr. Larose Kells            In the near future. Called to see if he needs to be seen left message to return call.

## 2018-08-19 NOTE — Patient Instructions (Addendum)
Medication Instructions:  Your physician recommends that you continue on your current medications as directed. Please refer to the Current Medication list given to you today.  Labwork: NONE  Testing/Procedures: Your physician has requested that you have a lexiscan myoview. For further information please visit [email or web address]. Please follow instruction sheet, as given.  Follow-Up: Your physician wants you to follow-up in: 12 months with Dr. Nishan. You will receive a reminder letter in the mail two months in advance. If you don't receive a letter, please call our office to schedule the follow-up appointment.   If you need a refill on your cardiac medications before your next appointment, please call your pharmacy.    

## 2018-08-20 ENCOUNTER — Encounter: Payer: Self-pay | Admitting: Internal Medicine

## 2018-08-20 ENCOUNTER — Telehealth: Payer: Self-pay

## 2018-08-20 ENCOUNTER — Ambulatory Visit (INDEPENDENT_AMBULATORY_CARE_PROVIDER_SITE_OTHER): Payer: Medicare Other | Admitting: Internal Medicine

## 2018-08-20 VITALS — BP 126/68 | HR 69 | Temp 97.9°F | Resp 16 | Ht 74.0 in | Wt 149.0 lb

## 2018-08-20 DIAGNOSIS — R739 Hyperglycemia, unspecified: Secondary | ICD-10-CM

## 2018-08-20 DIAGNOSIS — R5383 Other fatigue: Secondary | ICD-10-CM | POA: Diagnosis not present

## 2018-08-20 DIAGNOSIS — Z23 Encounter for immunization: Secondary | ICD-10-CM

## 2018-08-20 DIAGNOSIS — R110 Nausea: Secondary | ICD-10-CM | POA: Diagnosis not present

## 2018-08-20 DIAGNOSIS — R9389 Abnormal findings on diagnostic imaging of other specified body structures: Secondary | ICD-10-CM

## 2018-08-20 DIAGNOSIS — M810 Age-related osteoporosis without current pathological fracture: Secondary | ICD-10-CM | POA: Diagnosis not present

## 2018-08-20 MED ORDER — DOXYCYCLINE HYCLATE 100 MG PO TABS: 100.0000 mg | ORAL_TABLET | Freq: Two times a day (BID) | ORAL | 0 refills | Status: DC

## 2018-08-20 NOTE — Telephone Encounter (Signed)
Pt called to inform [name] that he was seen today by pcp for his hos f/u; contact pt back if needed

## 2018-08-20 NOTE — Progress Notes (Signed)
Pre visit review using our clinic review tool, if applicable. No additional management support is needed unless otherwise documented below in the visit note. 

## 2018-08-20 NOTE — Telephone Encounter (Signed)
[facility] follow up call made to patient. Left message for return call.

## 2018-08-20 NOTE — Patient Instructions (Signed)
GO TO THE LAB : Get the blood work     GO TO THE FRONT DESK Schedule your next appointment for a checkup in 3 weeks  Take doxycycline for 1 week  Mucinex to help the mucus in the morning  Call if fever or chills

## 2018-08-20 NOTE — Progress Notes (Signed)
Subjective:    Patient ID: [name], male    DOB: 10-08-1930, 84 y.o.   MRN: [id number]  DOS:  08/20/2018 Type of visit - description : ed f/u Interval history: Since the last office visit when he complained of nausea he saw GI on 07/16/2018, they felt that was probably medication related but was also recommended Zofran and a  EGD.  I do not see the EGD was completed. Subsequently was seen in this office 08/14/2018 due to weight loss, chronic nausea, labs were unremarkable. Eventually went to the ER 08/16/2018. Work-up including chest x-ray with emphysema and nodules. Labs and urinalysis unremarkable. CT abdomen with no acute processes.    chest   Wt Readings from Last 3 Encounters:  08/20/18 149 lb (67.6 kg)  08/19/18 146 lb 12 oz (66.6 kg)  08/16/18 151 lb 12.8 oz (68.9 kg)    Review of Systems He is here for follow-up after multiple other visits. Actually his main complaint is lack of energy.  Feels extremely fatigued. He continue with nausea on and off, it seems to be less prominent than before. Nausea is usually in the mornings, not necessarily postprandial.  No associated with abdominal pain or diarrhea.  As far as abnormal CT chest, I asked him about fever and chills and he denies it. He admits to having a lot of chest congestion early in the morning, he denies cough but states he brings up a lot of mucus from the chest (?)  Which is brown but nonbloody. Denies wheezing or difficulty breathing. He did report some chest pain, patient feels GI related, cardiology is planning a stress test.  In general he feels again very low energy, feels cold,  has sweaty palms.  Past Medical History:  Diagnosis Date  . Anxiety   . Atrial fibrillation ([place])   . CAD    a. 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  [name] Coronary artery disease   . Diverticulosis 2011   Diverticulitis 2004  . Esophageal reflux   .  Headache    saw neuro 4-16, MRI done  . HYPERPLASIA, PROSTATE NOS W/URINARY OBST/LUTS   . IBS   . INGUINAL HERNIA   . Lichen planus [id number]   [name]  . LUMBAR RADICULOPATHY, RIGHT   . Mixed hyperlipidemia   . OSTEOPOROSIS   . PONV (postoperative nausea and vomiting)   . PREMATURE VENTRICULAR CONTRACTIONS    a. After CABG - was on Coumadin/Multaq for a period of time. Coumadin discontinued 04/2010 after maintaining NSR.  [name] UNSPECIFIED ANEMIA     Past Surgical History:  Procedure Laterality Date  . CATARACT EXTRACTION Right 10/06/2013  . CORONARY ARTERY BYPASS GRAFT     2 VD;anomalous RCA;post op compliacted by AF  . HIP FRACTURE SURGERY Right 03/2007   Dr. Salvadore Farber  . INGUINAL HERNIA REPAIR Left 2012   Dr Brantley Stage  . KNEE SURGERY Right 1977   repair  . PROSTATE BIOPSY  09/19/2013   [facility]    Social History   Socioeconomic History  . Marital status: Widowed    Spouse name: Not on file  . Number of children: 0  . Years of education: Not on file  . Highest education level: Not on file  Occupational History  . Occupation: retired    Fish farm manager: RETIRED  Social Needs  . Financial resource strain: Not on file  . Food insecurity:  Worry: Not on file    Inability: Not on file  . Transportation needs:    Medical: Not on file    Non-medical: Not on file  Tobacco Use  . Smoking status: Former Smoker    Types: Cigarettes    Last attempt to quit: 11/27/1962    Years since quitting: 55.7  . Smokeless tobacco: Never Used  Substance and Sexual Activity  . Alcohol use: Yes    Alcohol/week: 0.0 standard drinks    Comment:  no wine recently  . Drug use: No  . Sexual activity: Not Currently  Lifestyle  . Physical activity:    Days per week: Not on file    Minutes per session: Not on file  . Stress: Not on file  Relationships  . Social connections:    Talks on phone: Not on file    Gets together: Not on file    Attends religious service: Not on file     Active member of club or organization: Not on file    Attends meetings of clubs or organizations: Not on file    Relationship status: Not on file  . Intimate partner violence:    Fear of current or ex partner: Not on file    Emotionally abused: Not on file    Physically abused: Not on file    Forced sexual activity: Not on file  Other Topics Concern  . Not on file  Social History Narrative   Lives at [place] in independent living.     Lost wife 06-2016.     Has no children.  Family: nephews-nices in [place]   Retired from [employer].     Still drives.       Allergies as of 08/20/2018      Reactions   Zoloft [sertraline Hcl]    Patient didn't like the way it made him feel   Remeron [mirtazapine] Other (See Comments)   Excessive Drowsiness      Medication List        Accurate as of 08/20/18 11:59 PM. Always use your most recent med list.          apixaban 5 MG Tabs tablet Commonly known as:  ELIQUIS Take 1 tablet (5 mg total) by mouth 2 (two) times daily.   aspirin 81 MG tablet Take 81 mg by mouth daily.   cholecalciferol 1000 units tablet Commonly known as:  VITAMIN D Take 1,000 Units by mouth every morning.   denosumab 60 MG/ML Sosy injection Commonly known as:  PROLIA Inject 60 mg into the skin every 6 (six) months.   doxycycline 100 MG tablet Commonly known as:  VIBRA-TABS Take 1 tablet (100 mg total) by mouth 2 (two) times daily.   dronedarone 400 MG tablet Commonly known as:  MULTAQ TAKE ONE TABLET TWICE DAILY WITH A MEAL   folic acid 1 MG tablet Commonly known as:  FOLVITE Take 1 mg by mouth as directed.   loratadine 10 MG tablet Commonly known as:  CLARITIN Take 10 mg by mouth as directed.   multivitamin with minerals Tabs tablet Take 1 tablet by mouth daily.   nitroGLYCERIN 0.4 MG SL tablet Commonly known as:  NITROSTAT Place 1 tablet (0.4 mg total) under the tongue every 5 (five) minutes x 3 doses as needed for chest pain.     ondansetron 4 MG disintegrating tablet Commonly known as:  ZOFRAN-ODT Take 1 tablet (4 mg total) by mouth every 8 (eight) hours as needed for nausea  or vomiting.   pantoprazole 40 MG tablet Commonly known as:  PROTONIX Take 1 tablet (40 mg total) by mouth daily.   rosuvastatin 10 MG tablet Commonly known as:  CRESTOR Take 10 mg by mouth as directed.   sucralfate 1 GM/10ML suspension Commonly known as:  CARAFATE Take 10 mLs (1 g total) by mouth 4 (four) times daily -  with meals and at bedtime.   TUMS PO Take 1 tablet by mouth 2 (two) times daily as needed (heartburn). For calcium          Objective:   Physical Exam BP 126/68 (BP Location: Left Arm, Patient Position: Sitting, Cuff Size: Small)   Pulse 69   Temp 97.9 F (36.6 C) (Oral)   Resp 16   Ht 6\' 2"  (1.88 m)   Wt 149 lb (67.6 kg)   SpO2 96%   BMI 19.13 kg/m  General:   Well developed, NAD, see BMI.  HEENT:  Normocephalic . Face symmetric, atraumatic Neck: No unusual lymphadenopathies, no supraclavicular mass Lungs:  Decreased breath sounds Normal respiratory effort, no intercostal retractions, no accessory muscle use. Heart: RRR,  no murmur.  no pretibial edema bilaterally   Normal femoral pulses bilaterally. Adomen:  Not distended, soft, non-tender. No rebound or rigidity.  Palpable, nontender aorta mid abdomen without the bruit. Skin: Not pale. Not jaundice Neurologic:  alert & oriented X3.  Speech normal, gait appropriate for age and unassisted Psych--  Cognition and judgment appear intact.  Cooperative with normal attention span and concentration.  Behavior appropriate. No anxious or depressed appearing.     Assessment & Plan:    Assessment Prediabetes, A1c 6.0 (2015) Hyperlipidemia Anxiety, depression:  -intol to zoloft, Rx lexapro 5 mg 07-2015: intolerant d/t nausea  -lost wife July 2017 CV: ---CAD--CABG 2011 ----P- Atrial fibrillation, PVCs. --Multaq, Elliquis  ----Palpable Ao x  years, [place] (-) for AAA 2005 COPD (per CXR 06-2015), PFTs 10-04-15 mild obstruction DJD  Gait-- uses a walker prn GI: GERD, IBS, diverticulosis GU: elevated PSA, BPH , (-) bx 2014 Osteoporosis: -hip FX 2008 - dexa ~2005 --> rx fosamax, took x 1 year, dexa ~2010 was rec no fosamax at that time. Dexa 09-2015: T score -2.4 --> rx fosamax, d/c d/t jaw pain 06-2017; prolia #26 February 2439 Lichen planus Headache -- saw neurology 02-2015, had a MRI/MRA (-)  PLAN Nausea: As described above, CT abdomen unremarkable, low suspicious for GI angina, GI is planning a endoscopy.  Recommend to proceed, symptomatic treatment until then. Fatigue: That is his main concern today, most recent labs show no anemia, electrolyte abnormality, renal insufficiency or hypothyroidism.  Adrenal glands on CT normal.  Will check a N02, folic acid, vitamin D, B12 and B1.  For completeness check a A1c Abnormal CT chest: Call radiology to discuss reports, spoke with Dr.  Viona Gilmore. L.  He agrees with the presence of sub-pleural nodules and  bilateral bronchiectasis at upper lobes; he compared the scan with the older one and some of the changes were already in the CT from 2009.  Repeat the scan is advisable but not mandatory based on the comparison. The patient is bringing g up sputum in the mornings thus will RX  doxycycline.  Reassess on RTC.  Consider pulmonary referral Flu shot today (patient afebrile)  RTC 3 weeks   Today, I spent more than 35   min with the patient: >50% of the time counseling and review the visit with other providers since the last time I saw  him.

## 2018-08-21 LAB — B12 AND FOLATE PANEL
Folate: >23.9 ng/mL (ref >5.9)
Vitamin B-12: 1107 pg/mL — ABNORMAL HIGH (ref 211–911)

## 2018-08-21 LAB — HEMOGLOBIN A1C: HEMOGLOBIN A1C: 6.1 % (ref 4.6–6.5)

## 2018-08-22 ENCOUNTER — Ambulatory Visit: Payer: Medicare Other | Admitting: *Deleted

## 2018-08-22 ENCOUNTER — Telehealth (HOSPITAL_COMMUNITY): Payer: Self-pay | Admitting: *Deleted

## 2018-08-22 ENCOUNTER — Telehealth: Payer: Self-pay | Admitting: Internal Medicine

## 2018-08-22 NOTE — Telephone Encounter (Signed)
Left message on voicemail in reference to upcoming appointment scheduled for 08/27/18. Phone number given for a call back so details instructions can be given. [name]

## 2018-08-22 NOTE — Telephone Encounter (Signed)
Prolia benefits received PA not required 20% Prolia $30 admin fee   Patient may owe approximately $68 OOP  Patient due after 10.8.19  Letter mailed to inform patient of benefits and to schedule

## 2018-08-23 LAB — VITAMIN D 1,25 DIHYDROXY
VITAMIN D 1, 25 (OH) TOTAL: 60 pg/mL (ref 18–72)
Vitamin D2 1, 25 (OH)2: <8 pg/mL
Vitamin D3 1, 25 (OH)2: 60 pg/mL

## 2018-08-23 LAB — VITAMIN B1: VITAMIN B1 (THIAMINE): 12 nmol/L (ref 8–30)

## 2018-08-26 ENCOUNTER — Encounter: Payer: Medicare Other | Admitting: Gastroenterology

## 2018-08-26 ENCOUNTER — Encounter: Payer: Medicare Other | Admitting: Internal Medicine

## 2018-08-26 ENCOUNTER — Ambulatory Visit: Payer: Medicare Other | Admitting: *Deleted

## 2018-08-26 ENCOUNTER — Encounter: Payer: Self-pay | Admitting: Internal Medicine

## 2018-08-26 NOTE — Assessment & Plan Note (Signed)
Nausea: As described above, CT abdomen unremarkable, low suspicious for GI angina, GI is planning a endoscopy.  Recommend to proceed, symptomatic treatment until then. Fatigue: That is his main concern today, most recent labs show no anemia, electrolyte abnormality, renal insufficiency or hypothyroidism.  Adrenal glands on CT normal.  Will check a F29, folic acid, vitamin D, B12 and B1.  For completeness check a A1c Abnormal CT chest: Call radiology to discuss reports, spoke with Dr.  Viona Gilmore. L.  He agrees with the presence of sub-pleural nodules and  bilateral bronchiectasis at upper lobes; he compared the scan with the older one and some of the changes were already in the CT from 2009.  Repeat the scan is advisable but not mandatory based on the comparison. The patient is bringing g up sputum in the mornings thus will RX  doxycycline.  Reassess on RTC.  Consider pulmonary referral Flu shot today (patient afebrile)  RTC 3 weeks

## 2018-08-27 ENCOUNTER — Ambulatory Visit (HOSPITAL_COMMUNITY): Payer: Medicare Other | Attending: Cardiovascular Disease

## 2018-08-27 DIAGNOSIS — E785 Hyperlipidemia, unspecified: Secondary | ICD-10-CM | POA: Insufficient documentation

## 2018-08-27 DIAGNOSIS — I2510 Atherosclerotic heart disease of native coronary artery without angina pectoris: Secondary | ICD-10-CM | POA: Diagnosis not present

## 2018-08-27 DIAGNOSIS — R079 Chest pain, unspecified: Secondary | ICD-10-CM | POA: Diagnosis not present

## 2018-08-27 DIAGNOSIS — R001 Bradycardia, unspecified: Secondary | ICD-10-CM | POA: Diagnosis not present

## 2018-08-27 DIAGNOSIS — I10 Essential (primary) hypertension: Secondary | ICD-10-CM | POA: Diagnosis not present

## 2018-08-27 DIAGNOSIS — I480 Paroxysmal atrial fibrillation: Secondary | ICD-10-CM | POA: Diagnosis not present

## 2018-08-27 DIAGNOSIS — I4510 Unspecified right bundle-branch block: Secondary | ICD-10-CM | POA: Insufficient documentation

## 2018-08-27 LAB — MYOCARDIAL PERFUSION IMAGING
LVDIAVOL: 90 mL (ref 62–150)
LVSYSVOL: 34 mL
Peak HR: 71 {beats}/min
Rest HR: 52 {beats}/min
SDS: 0
SRS: 0
SSS: 0
TID: 1.16

## 2018-08-27 MED ORDER — REGADENOSON 0.4 MG/5ML IV SOLN
0.4000 mg | Freq: Once | INTRAVENOUS | Status: AC
  Administered 2018-08-27: 0.4 mg via INTRAVENOUS

## 2018-08-27 MED ORDER — TECHNETIUM TC 99M TETROFOSMIN IV KIT
32.4000 | PACK | Freq: Once | INTRAVENOUS | Status: AC | PRN
  Administered 2018-08-27: 32.4 via INTRAVENOUS
  Filled 2018-08-27: qty 33

## 2018-08-27 MED ORDER — TECHNETIUM TC 99M TETROFOSMIN IV KIT
10.9000 | PACK | Freq: Once | INTRAVENOUS | Status: AC | PRN
  Administered 2018-08-27: 10.9 via INTRAVENOUS
  Filled 2018-08-27: qty 11

## 2018-08-27 NOTE — Telephone Encounter (Signed)
Needs Prolia ordered- scheduled 09/10/2018.

## 2018-08-27 NOTE — Telephone Encounter (Signed)
Noted, prolia ordered.

## 2018-08-28 ENCOUNTER — Telehealth: Payer: Self-pay | Admitting: Internal Medicine

## 2018-08-28 ENCOUNTER — Telehealth: Payer: Self-pay | Admitting: Cardiovascular Disease

## 2018-08-28 ENCOUNTER — Telehealth: Payer: Self-pay | Admitting: Gastroenterology

## 2018-08-28 MED ORDER — SUCRALFATE 1 GM/10ML PO SUSP: 1.0000 g | Freq: Three times a day (TID) | ORAL | 0 refills | Status: DC

## 2018-08-28 NOTE — Telephone Encounter (Signed)
Copied from [place] [id number]. Topic: Quick Communication - See Telephone Encounter >> Aug 28, 2018  8:21 AM Ivar Drape wrote: CRM for notification. See Telephone encounter for: 08/28/18. Patient would like to know if he is to continue taking the sucralfate (CARAFATE) 1 GM/10ML suspension medication that he was given in the hospital during his recent ER visit.

## 2018-08-28 NOTE — Telephone Encounter (Signed)
If the patient feels the medication is decreasing his symptoms, yes, send a month supply. If he is not helping, okay to stop.

## 2018-08-28 NOTE — Telephone Encounter (Signed)
Spoke w/ Pt- he thinks he should continue medication. Rx sent to [facility].

## 2018-08-28 NOTE — Telephone Encounter (Signed)
Please advise 

## 2018-08-28 NOTE — Telephone Encounter (Signed)
New Message:      Patient is calling for ECHO Results

## 2018-08-28 NOTE — Telephone Encounter (Signed)
Pt scheduled for a previsit 09/05/18@10am . He is aware of appt.

## 2018-08-28 NOTE — Telephone Encounter (Signed)
Called patient with his myoview results. Patient verbalized understanding.

## 2018-08-31 ENCOUNTER — Other Ambulatory Visit: Payer: Self-pay | Admitting: Cardiovascular Disease

## 2018-09-02 NOTE — Telephone Encounter (Signed)
Pt last saw Dr Johnsie Cancel 08/19/18, last labs 08/16/18 Creat 1.02, age 84, weight 67.6kg, based on specified criteria pt is on appropriate dosage of Eliquis 5mg  BID.  Will refill rx.

## 2018-09-03 ENCOUNTER — Encounter: Payer: Medicare Other | Admitting: Gastroenterology

## 2018-09-03 NOTE — Telephone Encounter (Signed)
Prolia in fridge

## 2018-09-05 ENCOUNTER — Encounter: Payer: Self-pay | Admitting: Gastroenterology

## 2018-09-05 ENCOUNTER — Ambulatory Visit (AMBULATORY_SURGERY_CENTER): Payer: Self-pay

## 2018-09-05 ENCOUNTER — Other Ambulatory Visit: Payer: Self-pay

## 2018-09-05 VITALS — Ht 74.0 in | Wt 147.6 lb

## 2018-09-05 DIAGNOSIS — R634 Abnormal weight loss: Secondary | ICD-10-CM

## 2018-09-05 DIAGNOSIS — R110 Nausea: Secondary | ICD-10-CM

## 2018-09-05 NOTE — Progress Notes (Signed)
No egg or soy allergy known to patient  No issues with past sedation with any surgeries  or procedures, intubation, pt verbalize having nausea after procedures, had cataract surgery on 7-22 no problems with sedation No diet pills per patient No home 02 use per patient  Yes  blood thinners taking Eliquis and advised to hold for 2 days prior to procedures. Pt denies issues with constipation  No A fib or A flutter  EMMI video sent to pt's e mail, doesn't have email

## 2018-09-08 ENCOUNTER — Observation Stay (HOSPITAL_COMMUNITY): Payer: Medicare Other

## 2018-09-08 ENCOUNTER — Observation Stay (HOSPITAL_COMMUNITY)
Admission: EM | Admit: 2018-09-08 | Discharge: 2018-09-11 | Disposition: A | Discharge status: Home / Self Care | Payer: Medicare Other | Attending: Family Medicine | Admitting: Family Medicine

## 2018-09-08 ENCOUNTER — Other Ambulatory Visit: Payer: Self-pay

## 2018-09-08 ENCOUNTER — Encounter (HOSPITAL_COMMUNITY): Payer: Self-pay | Admitting: Nurse Practitioner

## 2018-09-08 DIAGNOSIS — R1010 Upper abdominal pain, unspecified: Secondary | ICD-10-CM

## 2018-09-08 DIAGNOSIS — R1011 Right upper quadrant pain: Secondary | ICD-10-CM | POA: Diagnosis not present

## 2018-09-08 DIAGNOSIS — K219 Gastro-esophageal reflux disease without esophagitis: Secondary | ICD-10-CM | POA: Diagnosis not present

## 2018-09-08 DIAGNOSIS — F419 Anxiety disorder, unspecified: Secondary | ICD-10-CM | POA: Insufficient documentation

## 2018-09-08 DIAGNOSIS — K753 Granulomatous hepatitis, not elsewhere classified: Secondary | ICD-10-CM | POA: Diagnosis not present

## 2018-09-08 DIAGNOSIS — Z79899 Other long term (current) drug therapy: Secondary | ICD-10-CM | POA: Insufficient documentation

## 2018-09-08 DIAGNOSIS — K449 Diaphragmatic hernia without obstruction or gangrene: Secondary | ICD-10-CM | POA: Diagnosis not present

## 2018-09-08 DIAGNOSIS — Z7982 Long term (current) use of aspirin: Secondary | ICD-10-CM | POA: Diagnosis not present

## 2018-09-08 DIAGNOSIS — I480 Paroxysmal atrial fibrillation: Secondary | ICD-10-CM | POA: Diagnosis not present

## 2018-09-08 DIAGNOSIS — Z743 Need for continuous supervision: Secondary | ICD-10-CM | POA: Diagnosis not present

## 2018-09-08 DIAGNOSIS — K2980 Duodenitis without bleeding: Secondary | ICD-10-CM | POA: Diagnosis not present

## 2018-09-08 DIAGNOSIS — Z951 Presence of aortocoronary bypass graft: Secondary | ICD-10-CM | POA: Insufficient documentation

## 2018-09-08 DIAGNOSIS — I2510 Atherosclerotic heart disease of native coronary artery without angina pectoris: Secondary | ICD-10-CM | POA: Diagnosis not present

## 2018-09-08 DIAGNOSIS — R109 Unspecified abdominal pain: Secondary | ICD-10-CM

## 2018-09-08 DIAGNOSIS — J42 Unspecified chronic bronchitis: Secondary | ICD-10-CM

## 2018-09-08 DIAGNOSIS — K589 Irritable bowel syndrome without diarrhea: Secondary | ICD-10-CM | POA: Diagnosis present

## 2018-09-08 DIAGNOSIS — Z7901 Long term (current) use of anticoagulants: Secondary | ICD-10-CM | POA: Diagnosis not present

## 2018-09-08 DIAGNOSIS — J449 Chronic obstructive pulmonary disease, unspecified: Secondary | ICD-10-CM | POA: Diagnosis present

## 2018-09-08 DIAGNOSIS — M4854XA Collapsed vertebra, not elsewhere classified, thoracic region, initial encounter for fracture: Secondary | ICD-10-CM | POA: Diagnosis not present

## 2018-09-08 DIAGNOSIS — R319 Hematuria, unspecified: Secondary | ICD-10-CM | POA: Diagnosis not present

## 2018-09-08 DIAGNOSIS — I499 Cardiac arrhythmia, unspecified: Secondary | ICD-10-CM | POA: Diagnosis not present

## 2018-09-08 DIAGNOSIS — K2950 Unspecified chronic gastritis without bleeding: Principal | ICD-10-CM | POA: Insufficient documentation

## 2018-09-08 DIAGNOSIS — Z87891 Personal history of nicotine dependence: Secondary | ICD-10-CM | POA: Diagnosis not present

## 2018-09-08 DIAGNOSIS — I4819 Other persistent atrial fibrillation: Secondary | ICD-10-CM | POA: Diagnosis present

## 2018-09-08 DIAGNOSIS — J479 Bronchiectasis, uncomplicated: Secondary | ICD-10-CM | POA: Diagnosis not present

## 2018-09-08 DIAGNOSIS — R110 Nausea: Secondary | ICD-10-CM | POA: Diagnosis not present

## 2018-09-08 DIAGNOSIS — E782 Mixed hyperlipidemia: Secondary | ICD-10-CM | POA: Diagnosis present

## 2018-09-08 DIAGNOSIS — N400 Enlarged prostate without lower urinary tract symptoms: Secondary | ICD-10-CM | POA: Diagnosis not present

## 2018-09-08 DIAGNOSIS — R1013 Epigastric pain: Secondary | ICD-10-CM

## 2018-09-08 DIAGNOSIS — R1084 Generalized abdominal pain: Secondary | ICD-10-CM | POA: Diagnosis not present

## 2018-09-08 DIAGNOSIS — R509 Fever, unspecified: Secondary | ICD-10-CM

## 2018-09-08 LAB — URINALYSIS, ROUTINE W REFLEX MICROSCOPIC
BACTERIA UA: NONE SEEN
BILIRUBIN URINE: NEGATIVE
GLUCOSE, UA: NEGATIVE mg/dL
KETONES UR: 80 mg/dL — AB
Leukocytes, UA: NEGATIVE
NITRITE: NEGATIVE
PH: 6 (ref 5.0–8.0)
Protein, ur: NEGATIVE mg/dL
Specific Gravity, Urine: 1.011 (ref 1.005–1.030)

## 2018-09-08 LAB — CBC
HCT: 40.7 % (ref 39.0–52.0)
Hemoglobin: 13.2 g/dL (ref 13.0–17.0)
MCH: 30.1 pg (ref 26.0–34.0)
MCHC: 32.4 g/dL (ref 30.0–36.0)
MCV: 92.9 fL (ref 80.0–100.0)
NRBC: 0 % (ref 0.0–0.2)
PLATELETS: 180 10*3/uL (ref 150–400)
RBC: 4.38 MIL/uL (ref 4.22–5.81)
RDW: 16.1 % — AB (ref 11.5–15.5)
WBC: 6.6 10*3/uL (ref 4.0–10.5)

## 2018-09-08 LAB — I-STAT CG4 LACTIC ACID, ED: Lactic Acid, Venous: 1.44 mmol/L (ref 0.5–1.9)

## 2018-09-08 LAB — COMPREHENSIVE METABOLIC PANEL
ALT: 19 U/L (ref 0–44)
AST: 28 U/L (ref 15–41)
Albumin: 3.8 g/dL (ref 3.5–5.0)
Alkaline Phosphatase: 41 U/L (ref 38–126)
Anion gap: 14 (ref 5–15)
BILIRUBIN TOTAL: 0.6 mg/dL (ref 0.3–1.2)
BUN: 11 mg/dL (ref 8–23)
CALCIUM: 9.2 mg/dL (ref 8.9–10.3)
CO2: 26 mmol/L (ref 22–32)
Chloride: 95 mmol/L — ABNORMAL LOW (ref 98–111)
Creatinine, Ser: 0.88 mg/dL (ref 0.61–1.24)
Glucose, Bld: 97 mg/dL (ref 70–99)
Potassium: 3.8 mmol/L (ref 3.5–5.1)
Sodium: 135 mmol/L (ref 135–145)
TOTAL PROTEIN: 7 g/dL (ref 6.5–8.1)

## 2018-09-08 LAB — LIPASE, BLOOD: Lipase: 37 U/L (ref 11–51)

## 2018-09-08 MED ORDER — VITAMIN D 1000 UNITS PO TABS
1000.0000 [IU] | ORAL_TABLET | Freq: Every day | ORAL | Status: DC
  Administered 2018-09-09 – 2018-09-11 (×2): 1000 [IU] via ORAL
  Filled 2018-09-08 (×2): qty 1

## 2018-09-08 MED ORDER — PANTOPRAZOLE SODIUM 40 MG PO TBEC: 40.0000 mg | DELAYED_RELEASE_TABLET | Freq: Every day | ORAL | Status: DC

## 2018-09-08 MED ORDER — DRONEDARONE HCL 400 MG PO TABS
400.0000 mg | ORAL_TABLET | Freq: Two times a day (BID) | ORAL | Status: DC
  Administered 2018-09-09 – 2018-09-11 (×4): 400 mg via ORAL
  Filled 2018-09-08 (×6): qty 1

## 2018-09-08 MED ORDER — ADULT MULTIVITAMIN W/MINERALS CH
1.0000 | ORAL_TABLET | Freq: Every day | ORAL | Status: DC
  Administered 2018-09-09 – 2018-09-11 (×2): 1 via ORAL
  Filled 2018-09-08 (×2): qty 1

## 2018-09-08 MED ORDER — ASPIRIN EC 81 MG PO TBEC
81.0000 mg | DELAYED_RELEASE_TABLET | Freq: Every day | ORAL | Status: DC
  Administered 2018-09-09 – 2018-09-10 (×2): 81 mg via ORAL
  Filled 2018-09-08 (×2): qty 1

## 2018-09-08 MED ORDER — FOLIC ACID 1 MG PO TABS
1.0000 mg | ORAL_TABLET | Freq: Every day | ORAL | Status: DC
  Administered 2018-09-09 – 2018-09-11 (×2): 1 mg via ORAL
  Filled 2018-09-08 (×2): qty 1

## 2018-09-08 MED ORDER — ONDANSETRON HCL 4 MG PO TABS: 4.0000 mg | ORAL_TABLET | Freq: Four times a day (QID) | ORAL | Status: DC | PRN

## 2018-09-08 MED ORDER — DENOSUMAB 60 MG/ML ~~LOC~~ SOSY: 60.0000 mg | PREFILLED_SYRINGE | SUBCUTANEOUS | Status: DC

## 2018-09-08 MED ORDER — ENOXAPARIN SODIUM 40 MG/0.4ML ~~LOC~~ SOLN
40.0000 mg | Freq: Every day | SUBCUTANEOUS | Status: DC
  Administered 2018-09-08: 40 mg via SUBCUTANEOUS
  Filled 2018-09-08: qty 0.4

## 2018-09-08 MED ORDER — ROSUVASTATIN CALCIUM 10 MG PO TABS
10.0000 mg | ORAL_TABLET | Freq: Every day | ORAL | Status: DC
  Administered 2018-09-09 – 2018-09-11 (×3): 10 mg via ORAL
  Filled 2018-09-08 (×3): qty 1

## 2018-09-08 MED ORDER — SUCRALFATE 1 GM/10ML PO SUSP
1.0000 g | Freq: Three times a day (TID) | ORAL | Status: DC
  Administered 2018-09-09 – 2018-09-11 (×7): 1 g via ORAL
  Filled 2018-09-08 (×7): qty 10

## 2018-09-08 MED ORDER — LORATADINE 10 MG PO TABS
10.0000 mg | ORAL_TABLET | Freq: Every day | ORAL | Status: DC
  Filled 2018-09-08 (×2): qty 1

## 2018-09-08 MED ORDER — DEXTROSE-NACL 5-0.45 % IV SOLN
INTRAVENOUS | Status: DC
  Administered 2018-09-08 – 2018-09-11 (×5): via INTRAVENOUS

## 2018-09-08 MED ORDER — ONDANSETRON HCL 4 MG/2ML IJ SOLN
4.0000 mg | Freq: Four times a day (QID) | INTRAMUSCULAR | Status: DC | PRN
  Administered 2018-09-08 – 2018-09-09 (×2): 4 mg via INTRAVENOUS
  Filled 2018-09-08 (×3): qty 2

## 2018-09-08 MED ORDER — CALCIUM CARBONATE ANTACID 500 MG PO CHEW: 1.0000 | CHEWABLE_TABLET | Freq: Two times a day (BID) | ORAL | Status: DC | PRN

## 2018-09-08 MED ORDER — NITROGLYCERIN 0.4 MG SL SUBL: 0.4000 mg | SUBLINGUAL_TABLET | SUBLINGUAL | Status: DC | PRN

## 2018-09-08 NOTE — H&P (Signed)
History and Physical   [name]:[id number] DOB: 1930/03/25 DOA: 09/08/2018  Referring MD/NP/PA: Dr Vivi Martens  PCP: Colon Branch, MD   Outpatient Specialists: Dr Jackquline Denmark, Gastroenterologist, Jenkins Rouge, MD Cardiology   Patient coming from: Home  Chief Complaint: Abdominal pain with nausea  HPI: [name] is a 84 y.o. male with medical history significant of abdominal pain which is chronic and has been worked up repeatedly with chronic nausea, history of coronary artery disease with atrial fibrillation which is paroxysmal on chronic anticoagulation, hyperlipidemia and GERD who has been having recurrent abdominal pain and nausea and has seen gastroenterology with plan for upcoming EGD later this month.  Patient came to the ER due to worsening abdominal pain and nausea.  Patient is unable to eat without significant pain as well as nausea with occasional vomiting.  He was seen in the ER and treated.  Patient's pain improved with treatment but was unable to eat anything without the pain coming back.  Patient was unable to go home due to the pain and the nausea.  Patient has had work-up including ultrasound of the abdomen twice the last ultrasound was in July of this year.  First ultrasound showed sludge but subsequent one showed no gallbladder sludge.  He also had CT abdomen and pelvis in September of this year that was essentially normal.  Imaging was not repeated in the ER therefore.  Due to patient's inability to eat without pain and nausea vomiting he is being admitted for work-up..  ED Course: Temperature is 98.9 blood pressure 147/66 pulse 78 respiratory rate of 16 with oxygen sat 99% room air.  White count is 6.6 IMA globin 13.2 and platelets 180.  Sodium 135 potassium 3.8 and chloride 95 CO2 26 with creatinine 0.88.  Lactic acid 1.44 on glucose 97.  Urinalysis showed moderate hematuria but negative white count.  Abdominal ultrasound showed unremarkable finding of the gallbladder  with some liver hemangioma.  Review of Systems: As per HPI otherwise 10 point review of systems negative.    Past Medical History:  Diagnosis Date  . Anxiety   . Atrial fibrillation ([place])   . Blood transfusion without reported diagnosis   . CAD    a. 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  . Cataract   . Coronary artery disease   . Diverticulosis 2011   Diverticulitis 2004  . Esophageal reflux   . Headache    saw neuro 4-16, MRI done  . HYPERPLASIA, PROSTATE NOS W/URINARY OBST/LUTS   . IBS   . INGUINAL HERNIA   . Lichen planus [id number]   [name]  . LUMBAR RADICULOPATHY, RIGHT   . Mixed hyperlipidemia   . OSTEOPOROSIS   . PONV (postoperative nausea and vomiting)   . PREMATURE VENTRICULAR CONTRACTIONS    a. After CABG - was on Coumadin/Multaq for a period of time. Coumadin discontinued 04/2010 after maintaining NSR.  [name] UNSPECIFIED ANEMIA     Past Surgical History:  Procedure Laterality Date  . CATARACT EXTRACTION Right 10/06/2013  . CORONARY ARTERY BYPASS GRAFT     2 VD;anomalous RCA;post op compliacted by AF  . HIP FRACTURE SURGERY Right 03/2007   Dr. Salvadore Farber  . INGUINAL HERNIA REPAIR Left 2012   Dr Brantley Stage  . KNEE SURGERY Right 1977   repair  . PROSTATE BIOPSY  09/19/2013   [facility]  reports that he quit smoking about 55 years ago. His smoking use included cigarettes. He has never used smokeless tobacco. He reports that he drank alcohol. He reports that he does not use drugs.  Allergies  Allergen Reactions  . Zoloft [Sertraline Hcl]     Patient didn't like the way it made him feel  . Remeron [Mirtazapine] Other (See Comments)    Excessive Drowsiness    Family History  Problem Relation Age of Onset  . Stroke Mother   . Pancreatic cancer Father        Deceased, 57  . Breast cancer Sister        Deceased  . Heart disease Sister 92  . Stroke Maternal Aunt   . Colon  cancer Neg Hx   . Prostate cancer Neg Hx   . Esophageal cancer Neg Hx   . Rectal cancer Neg Hx   . Stomach cancer Neg Hx      Prior to Admission medications   Medication Sig Start Date End Date Taking? Authorizing Provider  aspirin 81 MG tablet Take 81 mg by mouth daily.   Yes [provider]  Calcium Carbonate Antacid (TUMS PO) Take 1 tablet by mouth 2 (two) times daily as needed (heartburn). For calcium   Yes [provider]  cholecalciferol (VITAMIN D) 1000 UNITS tablet Take 1,000 Units by mouth every morning.     Yes [provider]  denosumab (PROLIA) 60 MG/ML SOSY injection Inject 60 mg into the skin every 6 (six) months.   Yes [provider]  dronedarone (MULTAQ) 400 MG tablet TAKE ONE TABLET TWICE DAILY WITH A MEAL Patient taking differently: Take 400 mg by mouth 2 (two) times daily with a meal.  12/11/17  Yes Nishan, Wallis Bamberg, MD  ELIQUIS 5 MG TABS tablet TAKE ONE TABLET BY MOUTH TWICE DAILY Patient taking differently: Take 5 mg by mouth 2 (two) times daily.  09/02/18  Yes Josue Hector, MD  folic acid (FOLVITE) 1 MG tablet Take 1 mg by mouth daily.    Yes [provider]  loratadine (CLARITIN) 10 MG tablet Take 10 mg by mouth daily.    Yes [provider]  Multiple Vitamin (MULITIVITAMIN WITH MINERALS) TABS Take 1 tablet by mouth daily.     Yes [provider]  nitroGLYCERIN (NITROSTAT) 0.4 MG SL tablet Place 1 tablet (0.4 mg total) under the tongue every 5 (five) minutes x 3 doses as needed for chest pain. 08/21/17  Yes Paz, Alda Berthold, MD  pantoprazole (PROTONIX) 40 MG tablet Take 1 tablet (40 mg total) by mouth daily. 08/14/18  Yes Debbrah Alar, NP  rosuvastatin (CRESTOR) 10 MG tablet Take 10 mg by mouth daily.    Yes [provider]  sucralfate (CARAFATE) 1 GM/10ML suspension Take 10 mLs (1 g total) by mouth 4 (four) times daily -  with meals and at bedtime. 08/28/18  Yes Paz, Alda Berthold, MD  doxycycline  (VIBRA-TABS) 100 MG tablet Take 1 tablet (100 mg total) by mouth 2 (two) times daily. Patient not taking: Reported on 09/05/2018 08/20/18   Colon Branch, MD  ondansetron (ZOFRAN ODT) 4 MG disintegrating tablet Take 1 tablet (4 mg total) by mouth every 8 (eight) hours as needed for nausea or vomiting. Patient not taking: Reported on 09/05/2018 08/16/18   Quintella Reichert, MD    Physical Exam: Vitals:   09/08/18 1822  BP: 134/69  Pulse: 73  Resp: 14  Temp: 98.1 F (36.7 C)  TempSrc: Oral  SpO2: 100%      Constitutional: NAD, calm, comfortable Vitals:   09/08/18 1822  BP: 134/69  Pulse: 73  Resp: 14  Temp: 98.1 F (36.7 C)  TempSrc: Oral  SpO2: 100%   Eyes: PERRL, lids and conjunctivae normal ENMT: Mucous membranes are moist. Posterior pharynx clear of any exudate or lesions.Normal dentition.  Neck: normal, supple, no masses, no thyromegaly Respiratory: clear to auscultation bilaterally, no wheezing, no crackles. Normal respiratory effort. No accessory muscle use.  Cardiovascular: Regular rate and rhythm, no murmurs / rubs / gallops. No extremity edema. 2+ pedal pulses. No carotid bruits.  Abdomen: Mild epigastric tenderness, no masses palpated. No hepatosplenomegaly. Bowel sounds positive.  Musculoskeletal: no clubbing / cyanosis. No joint deformity upper and lower extremities. Good ROM, no contractures. Normal muscle tone.  Skin: no rashes, lesions, ulcers. No induration Neurologic: CN 2-12 grossly intact. Sensation intact, DTR normal. Strength 5/5 in all 4.  Psychiatric: Normal judgment and insight. Alert and oriented x 3. Normal mood.     Labs on Admission: I have personally reviewed following labs and imaging studies  CBC: Recent Labs  Lab 09/08/18 1833  WBC 6.6  HGB 13.2  HCT 40.7  MCV 92.9  PLT 951   Basic Metabolic Panel: Recent Labs  Lab 09/08/18 1833  NA 135  K 3.8  CL 95*  CO2 26  GLUCOSE 97  BUN 11  CREATININE 0.88  CALCIUM 9.2    GFR: Estimated Creatinine Clearance: 58.2 mL/min (by C-G formula based on SCr of 0.88 mg/dL). Liver Function Tests: Recent Labs  Lab 09/08/18 1833  AST 28  ALT 19  ALKPHOS 41  BILITOT 0.6  PROT 7.0  ALBUMIN 3.8   Recent Labs  Lab 09/08/18 1833  LIPASE 37   No results for input(s): AMMONIA in the last 168 hours. Coagulation Profile: No results for input(s): INR, PROTIME in the last 168 hours. Cardiac Enzymes: No results for input(s): CKTOTAL, CKMB, CKMBINDEX, TROPONINI in the last 168 hours. BNP (last 3 results) No results for input(s): PROBNP in the last 8760 hours. HbA1C: No results for input(s): HGBA1C in the last 72 hours. CBG: No results for input(s): GLUCAP in the last 168 hours. Lipid Profile: No results for input(s): CHOL, HDL, LDLCALC, TRIG, CHOLHDL, LDLDIRECT in the last 72 hours. Thyroid Function Tests: No results for input(s): TSH, T4TOTAL, FREET4, T3FREE, THYROIDAB in the last 72 hours. Anemia Panel: No results for input(s): VITAMINB12, FOLATE, FERRITIN, TIBC, IRON, RETICCTPCT in the last 72 hours. Urine analysis:    Component Value Date/Time   COLORURINE YELLOW 09/08/2018 1833   APPEARANCEUR CLEAR 09/08/2018 1833   LABSPEC 1.011 09/08/2018 1833   PHURINE 6.0 09/08/2018 1833   GLUCOSEU NEGATIVE 09/08/2018 1833   GLUCOSEU NEGATIVE 01/14/2016 1020   HGBUR MODERATE (A) 09/08/2018 1833   HGBUR negative 01/10/2011 1435   BILIRUBINUR NEGATIVE 09/08/2018 1833   BILIRUBINUR neg 08/08/2013 0916   KETONESUR 80 (A) 09/08/2018 1833   PROTEINUR NEGATIVE 09/08/2018 1833   UROBILINOGEN 0.2 01/14/2016 1020   NITRITE NEGATIVE 09/08/2018 1833   LEUKOCYTESUR NEGATIVE 09/08/2018 1833   Sepsis Labs: @LABRCNTIP (procalcitonin:4,lacticidven:4) )No results found for this or any previous visit (from the past 240 hour(s)).   Radiological Exams on Admission: No results found.   Assessment/Plan Principal Problem:   Abdominal pain Active Problems:   Mixed  hyperlipidemia   Coronary atherosclerosis   Esophageal reflux   IBS   Paroxysmal A-fib ([facility])   COPD (chronic obstructive pulmonary disease) ([place])     #  1 abdominal pain: This is chronic with some acute worsening.  Patient will be admitted for observation.  He has EGD planned for later this month.  Differential for patient's abdominal pain are multiple including severe GERD, biliary colic, with history of coronary artery disease ischemic bowel with angina is possible.  IBS has been suspected and possibly psychogenic.  GI has been working with patient.  Will consult GI in the morning for advice.  Meanwhile continue with PPIs and supportive care.  Clear liquid diet at this point.  Check lipase to rule out pancreatitis.  #2 paroxysmal atrial fibrillation: Patient is in sinus rhythm at the moment.  Hold his anticoagulation with Eliquis and continue Lovenox in case patient will get EGD  #3 GERD: Again continue with PPIs and sucralfate.  Also taken Tums  #4 hyperlipidemia: Continue monitoring.  Patient on Crestor.  #5 COPD: No acute exacerbation.  Continue with empiric monitoring and treatment.  #6 coronary artery disease: Stable with no obvious coronary syndrome.   DVT prophylaxis: Lovenox Code Status: Full code Family Communication: No family at bedside Disposition Plan: Home Consults called:[facility] to be called in the morning Admission status: Observation  Severity of Illness: The appropriate patient status for this patient is OBSERVATION. Observation status is judged to be reasonable and necessary in order to provide the required intensity of service to ensure the patient's safety. The patient's presenting symptoms, physical exam findings, and initial radiographic and laboratory data in the context of their medical condition is felt to place them at decreased risk for further clinical deterioration. Furthermore, it is anticipated that the patient will be medically stable  for discharge from the hospital within 2 midnights of admission. The following factors support the patient status of observation.   " The patient's presenting symptoms include abdominal pain with nausea. " The physical exam findings include mild epigastric tenderness. " The initial radiographic and laboratory data are normal abdominal ultrasound.     Barbette Merino MD [facility] Pager [phone]  If 7PM-7AM, please contact night-coverage [email or web address] Password [email or web address]  09/08/2018, 8:51 PM

## 2018-09-08 NOTE — ED Notes (Signed)
ED TO INPATIENT HANDOFF REPORT  Name/Age/Gender [name] 84 y.o. male  Code Status Code Status History    Date Active Date Inactive Code Status Order ID Comments User Context   08/29/2015 2125 08/31/2015 1645 Full Code [id number]  Theressa Millard, MD Inpatient   12/26/2013 7915 12/31/2013 2123 Full Code [id number]  Cristal Ford, DO Inpatient      Home/SNF/Other Boarding Home  Chief Complaint Abd. Pain  Level of Care/Admitting Diagnosis ED Disposition    ED Disposition Condition Comment   Admit  Hospital Area: [facility] [[id number]]  Level of Care: Med-Surg [16]  Diagnosis: Abdominal pain [482707]  Admitting Physician: Elwyn Reach [[id number]]  Attending Physician: Elwyn Reach [[id number]]  PT Class (Do Not Modify): Observation [104]  PT Acc Code (Do Not Modify): Observation [10022]       Medical History Past Medical History:  Diagnosis Date  . Anxiety   . Atrial fibrillation ([place])   . Blood transfusion without reported diagnosis   . CAD    a. 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  . Cataract   . Coronary artery disease   . Diverticulosis 2011   Diverticulitis 2004  . Esophageal reflux   . Headache    saw neuro 4-16, MRI done  . HYPERPLASIA, PROSTATE NOS W/URINARY OBST/LUTS   . IBS   . INGUINAL HERNIA   . Lichen planus [id number]   [name]  . LUMBAR RADICULOPATHY, RIGHT   . Mixed hyperlipidemia   . OSTEOPOROSIS   . PONV (postoperative nausea and vomiting)   . PREMATURE VENTRICULAR CONTRACTIONS    a. After CABG - was on Coumadin/Multaq for a period of time. Coumadin discontinued 04/2010 after maintaining NSR.  [name] UNSPECIFIED ANEMIA     Allergies Allergies  Allergen Reactions  . Zoloft [Sertraline Hcl]     Patient didn't like the way it made him feel  . Remeron [Mirtazapine] Other (See Comments)    Excessive Drowsiness    IV  Location/Drains/Wounds Patient Lines/Drains/Airways Status   Active Line/Drains/Airways    Name:   Placement date:   Placement time:   Site:   Days:   Peripheral IV 09/08/18 Left;Lateral Forearm   09/08/18    1829    Forearm   less than 1          Labs/Imaging Results for orders placed or performed during the hospital encounter of 09/08/18 (from the past 48 hour(s))  Lipase, blood     Status: None   Collection Time: 09/08/18  6:33 PM  Result Value Ref Range   Lipase 37 11 - 51 U/L    Comment: Performed at [facility], [place] [address], [place], [place] [address]  Comprehensive metabolic panel     Status: Abnormal   Collection Time: 09/08/18  6:33 PM  Result Value Ref Range   Sodium 135 135 - 145 mmol/L   Potassium 3.8 3.5 - 5.1 mmol/L   Chloride 95 (L) 98 - 111 mmol/L   CO2 26 22 - 32 mmol/L   Glucose, Bld 97 70 - 99 mg/dL   BUN 11 8 - 23 mg/dL   Creatinine, Ser 0.88 0.61 - 1.24 mg/dL   Calcium 9.2 8.9 - 10.3 mg/dL   Total Protein 7.0 6.5 - 8.1 g/dL   Albumin 3.8 3.5 - 5.0 g/dL   AST 28 15 - 41 U/L  ALT 19 0 - 44 U/L   Alkaline Phosphatase 41 38 - 126 U/L   Total Bilirubin 0.6 0.3 - 1.2 mg/dL   GFR calc non Af Amer >60 >60 mL/min   GFR calc Af Amer >60 >60 mL/min    Comment: (NOTE) The eGFR has been calculated using the CKD EPI equation. This calculation has not been validated in all clinical situations. eGFR's persistently <60 mL/min signify possible Chronic Kidney Disease.    Anion gap 14 5 - 15    Comment: Performed at [facility], [place] [address], [place], [place] [address]  CBC     Status: Abnormal   Collection Time: 09/08/18  6:33 PM  Result Value Ref Range   WBC 6.6 4.0 - 10.5 K/uL   RBC 4.38 4.22 - 5.81 MIL/uL   Hemoglobin 13.2 13.0 - 17.0 g/dL   HCT 40.7 39.0 - 52.0 %   MCV 92.9 80.0 - 100.0 fL   MCH 30.1 26.0 - 34.0 pg   MCHC 32.4 30.0 - 36.0 g/dL   RDW 16.1 (H) 11.5 - 15.5 %   Platelets 180 150 - 400 K/uL   nRBC  0.0 0.0 - 0.2 %    Comment: Performed at [facility], [place] [address] [place], [place] [address]  Urinalysis, Routine w reflex microscopic     Status: Abnormal   Collection Time: 09/08/18  6:33 PM  Result Value Ref Range   Color, Urine YELLOW YELLOW   APPearance CLEAR CLEAR   Specific Gravity, Urine 1.011 1.005 - 1.030   pH 6.0 5.0 - 8.0   Glucose, UA NEGATIVE NEGATIVE mg/dL   Hgb urine dipstick MODERATE (A) NEGATIVE   Bilirubin Urine NEGATIVE NEGATIVE   Ketones, ur 80 (A) NEGATIVE mg/dL   Protein, ur NEGATIVE NEGATIVE mg/dL   Nitrite NEGATIVE NEGATIVE   Leukocytes, UA NEGATIVE NEGATIVE   RBC / HPF 6-10 0 - 5 RBC/hpf   WBC, UA 0-5 0 - 5 WBC/hpf   Bacteria, UA NONE SEEN NONE SEEN    Comment: Performed at [facility], [place] [address], [place], [place] [address]  I-Stat CG4 Lactic Acid, ED     Status: None   Collection Time: 09/08/18  6:42 PM  Result Value Ref Range   Lactic Acid, Venous 1.44 0.5 - 1.9 mmol/L   No results found. None  Pending Labs Unresulted Labs (From admission, onward)    Start     Ordered   09/08/18 2055  Lipase, blood  Once,   R     09/08/18 2054   Signed and Held  CBC  (enoxaparin (LOVENOX)    CrCl >/= 30 ml/min)  Once,   R    Comments:  Baseline for enoxaparin therapy IF NOT ALREADY DRAWN.  Notify MD if PLT < 100 K.    Signed and Held   Signed and Held  Creatinine, serum  (enoxaparin (LOVENOX)    CrCl >/= 30 ml/min)  Once,   R    Comments:  Baseline for enoxaparin therapy IF NOT ALREADY DRAWN.    Signed and Held   Signed and Held  Creatinine, serum  (enoxaparin (LOVENOX)    CrCl >/= 30 ml/min)  Weekly,   R    Comments:  while on enoxaparin therapy    Signed and Held   Signed and Held  Comprehensive metabolic panel  Tomorrow morning,   R     Signed and Held   Signed and Held  CBC  Tomorrow morning,   R     Signed and Held          Vitals/Pain Today's Vitals   09/08/18 1814 09/08/18 1822 09/08/18 1910  09/08/18 2110  BP:  134/69  (!) 144/66  Pulse:  73  70  Resp:  14  15  Temp:  98.1 F (36.7 C)    TempSrc:  Oral    SpO2:  100%  99%  PainSc: 8   8      Isolation Precautions No active isolations  Medications Medications - No data to display  Mobility walks with person assist

## 2018-09-08 NOTE — ED Triage Notes (Signed)
Pt is c/o of increased abdominal pain, states he is aware that he "has stomach problems" and she is scheduled to see a GI specialist on 09/18/18. States today the pain is more than usual.

## 2018-09-08 NOTE — ED Provider Notes (Signed)
[facility] DEPT Provider Note   CSN: [id number] Arrival date & time: 09/08/18  1757     History   Chief Complaint Chief Complaint  Patient presents with  . Abdominal Pain    HPI [name] is a 84 y.o. male past medical history of A. fib, CAD, diverticulosis, IBS presents for evaluation of increased upper abdominal pain.  Patient states that about 4 months ago, he started developing chronic nausea.  He states that upper abdominal pain followed afterwards.  He states he has been evaluated multiple times for his symptoms no occlusive finding.  He follows with the [facility] and is scheduled to see them on 09/18/18 for endoscopy for further evaluation of his symptoms.  Patient reports that he came to the ED today because his upper abdominal pain significantly worsened.  He states that he ate some lunch and reports that shortly after, he started having worsening abdominal pain.  Patient reports he feels nauseous but no vomiting.  He states that the pain has slightly improved since being here in the ED.  Patient does report that the pain is worsened with spicy or fatty foods that he tries to limit eating those foods.  Patient states he has had chills but has not measured a fever at home.  His last bowel movement was earlier this morning.  No blood noted in stool.  Patient states that he is currently on Protonix and Carafate which she does not believe has been helping his symptoms.  Patient denies any chest pain, difficulty breathing, vomiting, urinary complaints.  The history is provided by the patient.    Past Medical History:  Diagnosis Date  . Anxiety   . Atrial fibrillation ([place])   . Blood transfusion without reported diagnosis   . CAD    a. 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  . Cataract   . Coronary artery disease   . Diverticulosis 2011   Diverticulitis 2004  . Esophageal  reflux   . Headache    saw neuro 4-16, MRI done  . HYPERPLASIA, PROSTATE NOS W/URINARY OBST/LUTS   . IBS   . INGUINAL HERNIA   . Lichen planus [id number]   [name]  . LUMBAR RADICULOPATHY, RIGHT   . Mixed hyperlipidemia   . OSTEOPOROSIS   . PONV (postoperative nausea and vomiting)   . PREMATURE VENTRICULAR CONTRACTIONS    a. After CABG - was on Coumadin/Multaq for a period of time. Coumadin discontinued 04/2010 after maintaining NSR.  [name] UNSPECIFIED ANEMIA     Patient Active Problem List   Diagnosis Date Noted  . Abdominal pain 09/08/2018  . Anxiety and depression 06/22/2016  . COPD (chronic obstructive pulmonary disease) ([place]) 03/25/2016  . Annual physical exam 03/24/2016  . PCP NOTES >>>>>>>>>>>>>> 08/27/2015  . Allergic rhinitis 03/09/2015  . Headache 02/23/2015  . Bradycardia 06/15/2014  . Arthritis, degenerative 06/15/2014  . Hyperglycemia 01/08/2014  . Paroxysmal A-fib ([place]) 12/28/2013  . Diverticulosis of colon without hemorrhage 10/13/2013  . Mixed hyperlipidemia 04/18/2010  . Coronary atherosclerosis 02/25/2010  . PREMATURE VENTRICULAR CONTRACTIONS 02/08/2010  . HIP FRACTURE, RIGHT 12/13/2009  . IBS 09/01/2009  . Weakness 06/28/2009  . LUMBAR RADICULOPATHY, RIGHT 06/09/2009  . Esophageal reflux 09/22/2008  . PALPITATIONS 05/11/2008  . HYPERPLASIA, PROSTATE NOS W/URINARY OBST/LUTS 09/20/2007  . Closed fracture of dorsal (thoracic) vertebra without mention of spinal cord injury 09/20/2007  . Osteoporosis  01/01/2007    Past Surgical History:  Procedure Laterality Date  . CATARACT EXTRACTION Right 10/06/2013  . CORONARY ARTERY BYPASS GRAFT     2 VD;anomalous RCA;post op compliacted by AF  . HIP FRACTURE SURGERY Right 03/2007   Dr. Salvadore Farber  . INGUINAL HERNIA REPAIR Left 2012   Dr Brantley Stage  . KNEE SURGERY Right 1977   repair  . PROSTATE BIOPSY  09/19/2013   [facility]        Home Medications    Prior to Admission medications   Medication Sig  Start Date End Date Taking? Authorizing Provider  aspirin 81 MG tablet Take 81 mg by mouth daily.   Yes [provider]  Calcium Carbonate Antacid (TUMS PO) Take 1 tablet by mouth 2 (two) times daily as needed (heartburn). For calcium   Yes [provider]  cholecalciferol (VITAMIN D) 1000 UNITS tablet Take 1,000 Units by mouth every morning.     Yes [provider]  denosumab (PROLIA) 60 MG/ML SOSY injection Inject 60 mg into the skin every 6 (six) months.   Yes [provider]  dronedarone (MULTAQ) 400 MG tablet TAKE ONE TABLET TWICE DAILY WITH A MEAL Patient taking differently: Take 400 mg by mouth 2 (two) times daily with a meal.  12/11/17  Yes Nishan, Wallis Bamberg, MD  ELIQUIS 5 MG TABS tablet TAKE ONE TABLET BY MOUTH TWICE DAILY Patient taking differently: Take 5 mg by mouth 2 (two) times daily.  09/02/18  Yes Josue Hector, MD  folic acid (FOLVITE) 1 MG tablet Take 1 mg by mouth daily.    Yes [provider]  loratadine (CLARITIN) 10 MG tablet Take 10 mg by mouth daily.    Yes [provider]  Multiple Vitamin (MULITIVITAMIN WITH MINERALS) TABS Take 1 tablet by mouth daily.     Yes [provider]  nitroGLYCERIN (NITROSTAT) 0.4 MG SL tablet Place 1 tablet (0.4 mg total) under the tongue every 5 (five) minutes x 3 doses as needed for chest pain. 08/21/17  Yes Paz, Alda Berthold, MD  pantoprazole (PROTONIX) 40 MG tablet Take 1 tablet (40 mg total) by mouth daily. 08/14/18  Yes Debbrah Alar, NP  rosuvastatin (CRESTOR) 10 MG tablet Take 10 mg by mouth daily.    Yes [provider]  sucralfate (CARAFATE) 1 GM/10ML suspension Take 10 mLs (1 g total) by mouth 4 (four) times daily -  with meals and at bedtime. 08/28/18  Yes Paz, Alda Berthold, MD  doxycycline (VIBRA-TABS) 100 MG tablet Take 1 tablet (100 mg total) by mouth 2 (two) times daily. Patient not taking: Reported on 09/05/2018 08/20/18   Colon Branch, MD  ondansetron (ZOFRAN ODT) 4 MG  disintegrating tablet Take 1 tablet (4 mg total) by mouth every 8 (eight) hours as needed for nausea or vomiting. Patient not taking: Reported on 09/05/2018 08/16/18   Quintella Reichert, MD    Family History Family History  Problem Relation Age of Onset  . Stroke Mother   . Pancreatic cancer Father        Deceased, 66  . Breast cancer Sister        Deceased  . Heart disease Sister 3  . Stroke Maternal Aunt   . Colon cancer Neg Hx   . Prostate cancer Neg Hx   . Esophageal cancer Neg Hx   . Rectal cancer Neg Hx   . Stomach cancer Neg Hx     Social History Social History   Tobacco  Use  . Smoking status: Former Smoker    Types: Cigarettes    Last attempt to quit: 11/27/1962    Years since quitting: 55.8  . Smokeless tobacco: Never Used  Substance Use Topics  . Alcohol use: Not Currently    Alcohol/week: 0.0 standard drinks    Comment: quit drinking   . Drug use: No     Allergies   Zoloft [sertraline hcl] and Remeron [mirtazapine]   Review of Systems Review of Systems  Constitutional: Positive for chills. Negative for fever.  Respiratory: Negative for cough and shortness of breath.   Cardiovascular: Negative for chest pain.  Gastrointestinal: Positive for abdominal pain and nausea. Negative for vomiting.  Genitourinary: Negative for dysuria and hematuria.  Neurological: Negative for headaches.  All other systems reviewed and are negative.    Physical Exam Updated Vital Signs BP (!) 144/66 (BP Location: Left Arm)   Pulse 70   Temp 98.1 F (36.7 C) (Oral)   Resp 15   SpO2 99%   Physical Exam  Constitutional: He is oriented to person, place, and time. He appears well-developed and well-nourished.  HENT:  Head: Normocephalic and atraumatic.  Mouth/Throat: Oropharynx is clear and moist and mucous membranes are normal.  Eyes: Pupils are equal, round, and reactive to light. Conjunctivae, EOM and lids are normal.  Neck: Full passive range of motion without pain.    Cardiovascular: Normal rate, regular rhythm, normal heart sounds and normal pulses. Exam reveals no gallop and no friction rub.  No murmur heard. Pulmonary/Chest: Effort normal and breath sounds normal.  Lungs clear to auscultation bilaterally.  Symmetric chest rise.  No wheezing, rales, rhonchi.  Abdominal: Soft. Normal appearance and bowel sounds are normal. There is tenderness in the right upper quadrant, epigastric area and left upper quadrant. There is no rigidity, no guarding and no CVA tenderness.  Abdomen is soft, nondistended.  Tenderness noted to the upper abdominal region bilaterally.  No rigidity, guarding.  No CVA tenderness bilaterally.  Musculoskeletal: Normal range of motion.  Neurological: He is alert and oriented to person, place, and time.  Skin: Skin is warm and dry. Capillary refill takes less than 2 seconds.  Psychiatric: He has a normal mood and affect. His speech is normal.  Nursing note and vitals reviewed.    ED Treatments / Results  Labs (all labs ordered are listed, but only abnormal results are displayed) Labs Reviewed  COMPREHENSIVE METABOLIC PANEL - Abnormal; Notable for the following components:      Result Value   Chloride 95 (*)    All other components within normal limits  CBC - Abnormal; Notable for the following components:   RDW 16.1 (*)    All other components within normal limits  URINALYSIS, ROUTINE W REFLEX MICROSCOPIC - Abnormal; Notable for the following components:   Hgb urine dipstick MODERATE (*)    Ketones, ur 80 (*)    All other components within normal limits  LIPASE, BLOOD  LIPASE, BLOOD  I-STAT CG4 LACTIC ACID, ED    EKG None  Radiology No results found.  Procedures Procedures (including critical care time)  Medications Ordered in ED Medications - No data to display   Initial Impression / Assessment and Plan / ED Course  I have reviewed the triage vital signs and the nursing notes.  Pertinent labs & imaging  results that were available during my care of the patient were reviewed by me and considered in my medical decision making (see chart for details).  84 year old male past medical history of A. fib, CAD, IBS who presents for evaluation of worsening abdominal pain.  Reports been an ongoing issue for the last several months as well as nausea.  Is scheduled to see GI on 10/2 3 for endoscopy.  Reports he came today for worsening pain after eating.  Reports chills at home no fevers.  No chest pain, difficulty breathing. Patient is afebrile, non-toxic appearing, sitting comfortably on examination table. Vital signs reviewed and stable.  Suspect biliary colic based on history/physical exam.  Low suspicion for diverticulitis stone history/physical exam.  Low suspicion for mesenteric ischemia but patient does have a history of A. fib.  He states he is not in persistent A. fib.  Will check lactic acid for further evaluation.  Plan to check basic labs.  Patient was seen in the ED on 08/16/18 for similar symptoms.  Work-up at that time was unremarkable.  CT abdomen pelvis showed colonic diverticulosis but no evidence of diverticulitis.  No acute intra-abdominal process that would explain patient's symptoms.  He had a follow-up appointment with GI on 07/16/18.  EGD was scheduled for October.  States he had endoscopy several years ago and reported that he had inflammation around the lining of his stomach.  No other findings.  CBC shows no significant leukocytosis or anemia.  Lipase unremarkable.  I-STAT lactic acid is negative.  CMP shows chloride of 95.  Otherwise unremarkable.  Patient states that he does not feel comfortable going home as he continues to have pain and he states he is afraid to eat because the pain gets worse with eating.  Discussed with patient that his work-up here is unremarkable but patient states that he cannot go home.  He understands that he may have to pay out-of-pocket for observation  admission.  He is understandable.  We will plan for admission with GI consult.   Discussed patient with Dr. Jonelle Sidle (hospitalist). Will accept patient for admission.   Final Clinical Impressions(s) / ED Diagnoses   Final diagnoses:  Abdominal pain  Pain of upper abdomen    ED Discharge Orders    None       Desma Mcgregor 09/08/18 2136    Lacretia Leigh, MD 09/08/18 2230

## 2018-09-08 NOTE — ED Provider Notes (Signed)
Medical screening examination/treatment/procedure(s) were conducted as a shared visit with non-physician practitioner(s) and myself.  I personally evaluated the patient during the encounter.  None 84 year old male presents with several month history of abdominal discomfort.  Pain has been in his right upper quadrant and epigastric area.  States that over the past couple days is gotten worse and he is afraid to eat.  Abdominal exam is benign here.  Labs are reassuring.  Patient is adamant that he does not want to go home because he is afraid to eat.  Will consult hospitalist for admission for observation which the patient states he will pay for as well as consultation for GI   Lacretia Leigh, MD 09/08/18 2008

## 2018-09-09 DIAGNOSIS — R1013 Epigastric pain: Secondary | ICD-10-CM | POA: Diagnosis not present

## 2018-09-09 DIAGNOSIS — R112 Nausea with vomiting, unspecified: Secondary | ICD-10-CM | POA: Diagnosis not present

## 2018-09-09 DIAGNOSIS — K449 Diaphragmatic hernia without obstruction or gangrene: Secondary | ICD-10-CM | POA: Diagnosis not present

## 2018-09-09 DIAGNOSIS — K2980 Duodenitis without bleeding: Secondary | ICD-10-CM | POA: Diagnosis not present

## 2018-09-09 DIAGNOSIS — R1010 Upper abdominal pain, unspecified: Secondary | ICD-10-CM | POA: Diagnosis not present

## 2018-09-09 DIAGNOSIS — K2970 Gastritis, unspecified, without bleeding: Secondary | ICD-10-CM | POA: Diagnosis not present

## 2018-09-09 LAB — COMPREHENSIVE METABOLIC PANEL
ALK PHOS: 39 U/L (ref 38–126)
ALT: 15 U/L (ref 0–44)
AST: 22 U/L (ref 15–41)
Albumin: 3.5 g/dL (ref 3.5–5.0)
Anion gap: 11 (ref 5–15)
BUN: 10 mg/dL (ref 8–23)
CALCIUM: 8.8 mg/dL — AB (ref 8.9–10.3)
CHLORIDE: 100 mmol/L (ref 98–111)
CO2: 25 mmol/L (ref 22–32)
CREATININE: 0.83 mg/dL (ref 0.61–1.24)
Glucose, Bld: 110 mg/dL — ABNORMAL HIGH (ref 70–99)
Potassium: 4.4 mmol/L (ref 3.5–5.1)
Sodium: 136 mmol/L (ref 135–145)
Total Bilirubin: 0.3 mg/dL (ref 0.3–1.2)
Total Protein: 6.4 g/dL — ABNORMAL LOW (ref 6.5–8.1)

## 2018-09-09 LAB — CBC
HCT: 40.4 % (ref 39.0–52.0)
Hemoglobin: 13.1 g/dL (ref 13.0–17.0)
MCH: 29.7 pg (ref 26.0–34.0)
MCHC: 32.4 g/dL (ref 30.0–36.0)
MCV: 91.6 fL (ref 80.0–100.0)
NRBC: 0 % (ref 0.0–0.2)
PLATELETS: 169 10*3/uL (ref 150–400)
RBC: 4.41 MIL/uL (ref 4.22–5.81)
RDW: 16 % — AB (ref 11.5–15.5)
WBC: 5 10*3/uL (ref 4.0–10.5)

## 2018-09-09 LAB — LIPASE, BLOOD: Lipase: 35 U/L (ref 11–51)

## 2018-09-09 MED ORDER — ENOXAPARIN SODIUM 40 MG/0.4ML ~~LOC~~ SOLN
40.0000 mg | SUBCUTANEOUS | Status: DC
  Administered 2018-09-10: 40 mg via SUBCUTANEOUS
  Filled 2018-09-09: qty 0.4

## 2018-09-09 MED ORDER — PANTOPRAZOLE SODIUM 40 MG PO TBEC
40.0000 mg | DELAYED_RELEASE_TABLET | Freq: Two times a day (BID) | ORAL | Status: DC
  Administered 2018-09-09 (×2): 40 mg via ORAL
  Filled 2018-09-09 (×2): qty 1

## 2018-09-09 MED ORDER — ACETAMINOPHEN 325 MG PO TABS
650.0000 mg | ORAL_TABLET | Freq: Four times a day (QID) | ORAL | Status: DC | PRN
  Administered 2018-09-10: 650 mg via ORAL
  Filled 2018-09-09: qty 2

## 2018-09-09 NOTE — Progress Notes (Signed)
PROGRESS NOTE    [name]  OYD:[id number] DOB: Dec 06, 1929 DOA: 09/08/2018 PCP: Colon Branch, MD   Brief Narrative: Patient is 84 year old male with past medical history of chronic abdominal pain, chronic nausea, coronary artery disease, A. fib, hyperlipidemia, GERD who presented to the emergency department for evaluation of worsening abdominal pain, nausea and not able to eat anything by mouth.  GI has already evaluated him and he has been planned for  EGD tomorrow.   Assessment & Plan:   Principal Problem:   Abdominal pain Active Problems:   Mixed hyperlipidemia   Coronary atherosclerosis   Esophageal reflux   IBS   Paroxysmal A-fib ([facility])   COPD (chronic obstructive pulmonary disease) ([facility])  Abdominal pain: Acute on chronic.  Plan for EGD tomorrow.  Patient has this pain on and off and had multiple imagings done in the past without any significant abnormalities.  Continue PPI and carafate.  Nausea: Continue Zofran.  Paroxysmal A. fib: Currently in normal sinus rhythm.  On Lovenox at present.  He is on Eliquis at home.  Continue Multaq.  GERD: Continue PPI and Carafate  Hyperlipidemia: Continue Crestor  History of COPD: Not on exacerbation.  Stable  History of coronary disease: Denies any chest pain.  Continue home medications.    DVT prophylaxis: Lovenox Code Status: Full Family Communication: None at the bed side Disposition Plan: Home after EGD   Consultants: GI  Procedures:None  Antimicrobials:None  Subjective: Patient seen and examined the bedside this morning.  Still complaining of mild abdominal pain.  Complaining of nausea.  Has not vomited.  Objective: Vitals:   09/08/18 2157 09/08/18 2201 09/08/18 2202 09/09/18 0613  BP:  (!) 147/66 (!) 147/66 122/65  Pulse:  76 78 86  Resp:  16 16 18   Temp:  98.9 F (37.2 C) 98.9 F (37.2 C) 98 F (36.7 C)  TempSrc:  Oral Oral Oral  SpO2:  99% 99% 97%  Weight: 65.4 kg     Height: 6\' 2"  (1.88 m)        Intake/Output Summary (Last 24 hours) at 09/09/2018 1305 Last data filed at 09/09/2018 1210 Gross per 24 hour  Intake 800 ml  Output 800 ml  Net 0 ml   Filed Weights   09/08/18 2157  Weight: 65.4 kg    Examination:  General exam: Appears calm and comfortable ,Not in distress,thin built HEENT:PERRL,Oral mucosa moist, Ear/Nose normal on gross exam Respiratory system: Bilateral equal air entry, normal vesicular breath sounds, no wheezes or crackles  Cardiovascular system: S1 & S2 heard, RRR. No JVD, murmurs, rubs, gallops or clicks. No pedal edema. Gastrointestinal system: Abdomen is nondistended, soft .  Mild tenderness on the periumbilical region. No organomegaly or masses felt. Normal bowel sounds heard. Central nervous system: Alert and oriented. No focal neurological deficits. Extremities: No edema, no clubbing ,no cyanosis, distal peripheral pulses palpable. Skin: No rashes, lesions or ulcers,no icterus ,no pallor MSK: Normal muscle bulk,tone ,power Psychiatry: Judgement and insight appear normal. Mood & affect appropriate.     Data Reviewed: I have personally reviewed following labs and imaging studies  CBC: Recent Labs  Lab 09/08/18 1833 09/09/18 0434  WBC 6.6 5.0  HGB 13.2 13.1  HCT 40.7 40.4  MCV 92.9 91.6  PLT 180 672   Basic Metabolic Panel: Recent Labs  Lab 09/08/18 1833 09/09/18 0434  NA 135 136  K 3.8 4.4  CL 95* 100  CO2 26 25  GLUCOSE 97 110*  BUN 11 10  CREATININE 0.88 0.83  CALCIUM 9.2 8.8*   GFR: Estimated Creatinine Clearance: 60.2 mL/min (by C-G formula based on SCr of 0.83 mg/dL). Liver Function Tests: Recent Labs  Lab 09/08/18 1833 09/09/18 0434  AST 28 22  ALT 19 15  ALKPHOS 41 39  BILITOT 0.6 0.3  PROT 7.0 6.4*  ALBUMIN 3.8 3.5   Recent Labs  Lab 09/08/18 1833 09/09/18 0434  LIPASE 37 35   No results for input(s): AMMONIA in the last 168 hours. Coagulation Profile: No results for input(s): INR, PROTIME in the  last 168 hours. Cardiac Enzymes: No results for input(s): CKTOTAL, CKMB, CKMBINDEX, TROPONINI in the last 168 hours. BNP (last 3 results) No results for input(s): PROBNP in the last 8760 hours. HbA1C: No results for input(s): HGBA1C in the last 72 hours. CBG: No results for input(s): GLUCAP in the last 168 hours. Lipid Profile: No results for input(s): CHOL, HDL, LDLCALC, TRIG, CHOLHDL, LDLDIRECT in the last 72 hours. Thyroid Function Tests: No results for input(s): TSH, T4TOTAL, FREET4, T3FREE, THYROIDAB in the last 72 hours. Anemia Panel: No results for input(s): VITAMINB12, FOLATE, FERRITIN, TIBC, IRON, RETICCTPCT in the last 72 hours. Sepsis Labs: Recent Labs  Lab 09/08/18 1842  LATICACIDVEN 1.44    No results found for this or any previous visit (from the past 240 hour(s)).       Radiology Studies: US Abdomen Limited Ruq  Result Date: 09/08/2018 CLINICAL DATA:  Right upper quadrant pain for 2 months EXAM: ULTRASOUND ABDOMEN LIMITED RIGHT UPPER QUADRANT COMPARISON:  None. FINDINGS: Gallbladder: No gallstones or wall thickening visualized. No sonographic Murphy sign noted by sonographer. Common bile duct: Diameter: 2.5 mm Liver: Echogenic liver lesion in the left hepatic lobe measuring 1.3 x 1 x 1.1 cm consistent with a small hemangioma. Portal vein is patent on color Doppler imaging with normal direction of blood flow towards the liver. IMPRESSION: Unremarkable appearance of the gallbladder. Echogenic liver lesion measuring 1.1 x 1 x 1.1 cm compatible with a hemangioma. Electronically Signed   By: Ashley Royalty M.D.   On: 09/08/2018 22:09        Scheduled Meds: . aspirin EC  81 mg Oral Daily  . cholecalciferol  1,000 Units Oral Daily  . dronedarone  400 mg Oral BID WC  . enoxaparin (LOVENOX) injection  40 mg Subcutaneous QHS  . folic acid  1 mg Oral Daily  . loratadine  10 mg Oral Daily  . multivitamin with minerals  1 tablet Oral Daily  . pantoprazole  40 mg Oral BID   . rosuvastatin  10 mg Oral Daily  . sucralfate  1 g Oral TID WC & HS   Continuous Infusions: . dextrose 5 % and 0.45% NaCl 75 mL/hr at 09/08/18 2315     LOS: 0 days    Time spent: 25 mins.More than 50% of that time was spent in counseling and/or coordination of care.      Shelly Coss, MD [facility] Pager [phone]  If 7PM-7AM, please contact night-coverage [email or web address] Password [email or web address] 09/09/2018, 1:05 PM

## 2018-09-09 NOTE — Consult Note (Signed)
Consultation  Referring Provider: Dr. Tawanna Solo    Primary Care Physician:  Colon Branch, MD Primary Gastroenterologist: Dr. Lyndel Safe      Reason for Consultation: Chronic nausea and abdominal pain            HPI:   [name] is a 84 y.o. male with a past medical history as listed below including A. fib on Eliquis, COPD and CAD (08/30/2015 echo LVEF 55-60%), who follows with Dr. Arvid Right and presented to the ER on 09/08/2018 with a complaint of abdominal pain and nausea.    07/16/2018 office visit with Dr. Lyndel Safe to discuss history of long-standing nausea which occurred mostly around 10:30/11 almost every day, history of Multaq at around 7:00/8:00 AM.  He did not have nausea after taking his evening doses as he went to bed afterwards.  Denied significant vomiting.  Had lost 5 pounds over the past 6 months.  Zofran did help.  At that time it was discussed his chronic nausea was likely due to medications like Multaq.  Patient had a negative ultrasound, normal CBC, CMP, TSH and PSA in 2019.  He was given Zofran 4 mg ODT as needed and an EGD was ordered for further evaluation, this was scheduled 09/18/2018.    Today, patient explains that he came to the ER due to worsening abdominal pain and nausea.  Explained he was unable to eat without significant pain as well as nausea with occasional vomiting.  Patient is very histrionic and it is hard to get him to pinpoint exactly what was going on but in general things were getting worse.  Describes an epigastric burning pain which was even made worse 2 days ago after drinking some chicken broth inadvertently with peppers in it.  Had worsening nausea which was increasing in frequency and unhelped by Zofran, as well as this pain.  Was taking Pantoprazole 40 mg daily and Carafate 4 times daily.  Describes vague history of having EGD many years ago for similar symptoms and being told he had "inflammation of the lining of my stomach".  Associated symptoms include an  increase in heartburn recently.    Denies fever, chills, weight loss, anorexia or change in bowel habits.    ED Course: Temperature is 98.9 blood pressure 147/66 pulse 78 respiratory rate of 16 with oxygen sat 99% room air.  White count is 6.6 IMA globin 13.2 and platelets 180.  Sodium 135 potassium 3.8 and chloride 95 CO2 26 with creatinine 0.88.  Lactic acid 1.44 on glucose 97.  Urinalysis showed moderate hematuria but negative white count.  Abdominal ultrasound showed unremarkable finding of the gallbladder with some liver hemangioma.  Past Medical History:  Diagnosis Date  . Anxiety   . Atrial fibrillation ([place])   . Blood transfusion without reported diagnosis   . CAD    a. 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  . Cataract   . Coronary artery disease   . Diverticulosis 2011   Diverticulitis 2004  . Esophageal reflux   . Headache    saw neuro 4-16, MRI done  . HYPERPLASIA, PROSTATE NOS W/URINARY OBST/LUTS   . IBS   . INGUINAL HERNIA   . Lichen planus 8101   [name]  . LUMBAR RADICULOPATHY, RIGHT   . Mixed hyperlipidemia   . OSTEOPOROSIS   . PONV (postoperative nausea and vomiting)   . PREMATURE VENTRICULAR CONTRACTIONS  a. After CABG - was on Coumadin/Multaq for a period of time. Coumadin discontinued 04/2010 after maintaining NSR.  [name] UNSPECIFIED ANEMIA     Past Surgical History:  Procedure Laterality Date  . CATARACT EXTRACTION Right 10/06/2013  . CORONARY ARTERY BYPASS GRAFT     2 VD;anomalous RCA;post op compliacted by AF  . HIP FRACTURE SURGERY Right 03/2007   Dr. Salvadore Farber  . INGUINAL HERNIA REPAIR Left 2012   Dr Brantley Stage  . KNEE SURGERY Right 1977   repair  . PROSTATE BIOPSY  09/19/2013   [facility]    Family History  Problem Relation Age of Onset  . Stroke Mother   . Pancreatic cancer Father        Deceased, 32  . Breast cancer Sister        Deceased  . Heart  disease Sister 46  . Stroke Maternal Aunt   . Colon cancer Neg Hx   . Prostate cancer Neg Hx   . Esophageal cancer Neg Hx   . Rectal cancer Neg Hx   . Stomach cancer Neg Hx      Social History   Tobacco Use  . Smoking status: Former Smoker    Types: Cigarettes    Last attempt to quit: 11/27/1962    Years since quitting: 55.8  . Smokeless tobacco: Never Used  Substance Use Topics  . Alcohol use: Not Currently    Alcohol/week: 0.0 standard drinks    Comment: quit drinking   . Drug use: No    Prior to Admission medications   Medication Sig Start Date End Date Taking? Authorizing Provider  aspirin 81 MG tablet Take 81 mg by mouth daily.   Yes [provider]  Calcium Carbonate Antacid (TUMS PO) Take 1 tablet by mouth 2 (two) times daily as needed (heartburn). For calcium   Yes [provider]  cholecalciferol (VITAMIN D) 1000 UNITS tablet Take 1,000 Units by mouth every morning.     Yes [provider]  denosumab (PROLIA) 60 MG/ML SOSY injection Inject 60 mg into the skin every 6 (six) months.   Yes [provider]  dronedarone (MULTAQ) 400 MG tablet TAKE ONE TABLET TWICE DAILY WITH A MEAL Patient taking differently: Take 400 mg by mouth 2 (two) times daily with a meal.  12/11/17  Yes Nishan, Wallis Bamberg, MD  ELIQUIS 5 MG TABS tablet TAKE ONE TABLET BY MOUTH TWICE DAILY Patient taking differently: Take 5 mg by mouth 2 (two) times daily.  09/02/18  Yes Josue Hector, MD  folic acid (FOLVITE) 1 MG tablet Take 1 mg by mouth daily.    Yes [provider]  loratadine (CLARITIN) 10 MG tablet Take 10 mg by mouth daily.    Yes [provider]  Multiple Vitamin (MULITIVITAMIN WITH MINERALS) TABS Take 1 tablet by mouth daily.     Yes [provider]  nitroGLYCERIN (NITROSTAT) 0.4 MG SL tablet Place 1 tablet (0.4 mg total) under the tongue every 5 (five) minutes x 3 doses as needed for chest pain. 08/21/17  Yes Paz, Alda Berthold, MD    pantoprazole (PROTONIX) 40 MG tablet Take 1 tablet (40 mg total) by mouth daily. 08/14/18  Yes Debbrah Alar, NP  rosuvastatin (CRESTOR) 10 MG tablet Take 10 mg by mouth daily.    Yes [provider]  sucralfate (CARAFATE) 1 GM/10ML suspension Take 10 mLs (1 g total) by mouth 4 (four) times daily -  with meals and at bedtime. 08/28/18  Yes  Colon Branch, MD  doxycycline (VIBRA-TABS) 100 MG tablet Take 1 tablet (100 mg total) by mouth 2 (two) times daily. Patient not taking: Reported on 09/05/2018 08/20/18   Colon Branch, MD  ondansetron (ZOFRAN ODT) 4 MG disintegrating tablet Take 1 tablet (4 mg total) by mouth every 8 (eight) hours as needed for nausea or vomiting. Patient not taking: Reported on 09/05/2018 08/16/18   Quintella Reichert, MD    Current Facility-Administered Medications  Medication Dose Route Frequency Provider Last Rate Last Dose  . aspirin EC tablet 81 mg  81 mg Oral Daily Gala Romney L, MD      . calcium carbonate (TUMS - dosed in mg elemental calcium) chewable tablet 200 mg of elemental calcium  1 tablet Oral BID PRN Elwyn Reach, MD      . cholecalciferol (VITAMIN D) tablet 1,000 Units  1,000 Units Oral Daily Garba, Mohammad L, MD      . dextrose 5 %-0.45 % sodium chloride infusion   Intravenous Continuous Elwyn Reach, MD 75 mL/hr at 09/08/18 2315    . dronedarone (MULTAQ) tablet 400 mg  400 mg Oral BID WC Gala Romney L, MD   400 mg at 09/09/18 0821  . enoxaparin (LOVENOX) injection 40 mg  40 mg Subcutaneous QHS Gala Romney L, MD   40 mg at 09/08/18 2315  . folic acid (FOLVITE) tablet 1 mg  1 mg Oral Daily Jonelle Sidle, Mohammad L, MD      . loratadine (CLARITIN) tablet 10 mg  10 mg Oral Daily Elwyn Reach, MD      . multivitamin with minerals tablet 1 tablet  1 tablet Oral Daily Garba, Mohammad L, MD      . nitroGLYCERIN (NITROSTAT) SL tablet 0.4 mg  0.4 mg Sublingual Q5 Min x 3 PRN Jonelle Sidle, Mohammad L, MD      . ondansetron (ZOFRAN) tablet 4 mg  4  mg Oral Q6H PRN Elwyn Reach, MD       Or  . ondansetron (ZOFRAN) injection 4 mg  4 mg Intravenous Q6H PRN Elwyn Reach, MD   4 mg at 09/09/18 0827  . pantoprazole (PROTONIX) EC tablet 40 mg  40 mg Oral BID Shelly Coss, MD      . rosuvastatin (CRESTOR) tablet 10 mg  10 mg Oral Daily Garba, Mohammad L, MD      . sucralfate (CARAFATE) 1 GM/10ML suspension 1 g  1 g Oral TID WC & HS Elwyn Reach, MD   1 g at 09/09/18 4081    Allergies as of 09/08/2018 - Review Complete 09/08/2018  Allergen Reaction Noted  . Zoloft [sertraline hcl]  07/16/2018  . Remeron [mirtazapine] Other (See Comments) 08/08/2016     Review of Systems:    Constitutional: No fever or chills Skin: No rash  Cardiovascular: No chest pain Respiratory: No SOB  Gastrointestinal: See HPI and otherwise negative Genitourinary: No dysuria  Neurological: No headache, dizziness or syncope Musculoskeletal: No new muscle or joint pain Hematologic: No bleeding  Psychiatric: No history of depression or anxiety    Physical Exam:  Vital signs in last 24 hours: Temp:  [98 F (36.7 C)-98.9 F (37.2 C)] 98 F (36.7 C) (10/14 4481) Pulse Rate:  [70-86] 86 (10/14 0613) Resp:  [14-18] 18 (10/14 0613) BP: (122-147)/(65-69) 122/65 (10/14 0613) SpO2:  [97 %-100 %] 97 % (10/14 0613) Weight:  [65.4 kg] 65.4 kg (10/13 2157) Last BM Date: 09/08/18 General:   Pleasant elderly Caucasian  male appears to be in NAD, Well developed, Well nourished, alert and cooperative Head:  Normocephalic and atraumatic. Eyes:   PEERL, EOMI. No icterus. Conjunctiva pink. Ears:  Normal auditory acuity. Neck:  Supple Throat: Oral cavity and pharynx without inflammation, swelling or lesion. Poor dentition (hx of partial dentures at home) Lungs: Respirations even and unlabored. Lungs clear to auscultation bilaterally.   No wheezes, crackles, or rhonchi.  Heart: Normal S1, S2. No MRG. Regular rate and rhythm. No peripheral edema, cyanosis or  pallor.  Abdomen:  Soft, nondistended, nontender. No rebound or guarding. Normal bowel sounds. No appreciable masses or hepatomegaly. Rectal:  Not performed.  Msk:  Symmetrical without gross deformities. Peripheral pulses intact.  Extremities:  Without edema, no deformity or joint abnormality.  Neurologic:  Alert and  oriented x4;  grossly normal neurologically.  Skin:   Dry and intact without significant lesions or rashes. Psychiatric: Demonstrates good judgement and reason without abnormal affect or behaviors.   LAB RESULTS: Recent Labs    09/08/18 1833 09/09/18 0434  WBC 6.6 5.0  HGB 13.2 13.1  HCT 40.7 40.4  PLT 180 169   BMET Recent Labs    09/08/18 1833 09/09/18 0434  NA 135 136  K 3.8 4.4  CL 95* 100  CO2 26 25  GLUCOSE 97 110*  BUN 11 10  CREATININE 0.88 0.83  CALCIUM 9.2 8.8*   LFT Recent Labs    09/09/18 0434  PROT 6.4*  ALBUMIN 3.5  AST 22  ALT 15  ALKPHOS 39  BILITOT 0.3    STUDIES: US Abdomen Limited Ruq  Result Date: 09/08/2018 CLINICAL DATA:  Right upper quadrant pain for 2 months EXAM: ULTRASOUND ABDOMEN LIMITED RIGHT UPPER QUADRANT COMPARISON:  None. FINDINGS: Gallbladder: No gallstones or wall thickening visualized. No sonographic Murphy sign noted by sonographer. Common bile duct: Diameter: 2.5 mm Liver: Echogenic liver lesion in the left hepatic lobe measuring 1.3 x 1 x 1.1 cm consistent with a small hemangioma. Portal vein is patent on color Doppler imaging with normal direction of blood flow towards the liver. IMPRESSION: Unremarkable appearance of the gallbladder. Echogenic liver lesion measuring 1.1 x 1 x 1.1 cm compatible with a hemangioma. Electronically Signed   By: Ashley Royalty M.D.   On: 09/08/2018 22:09    Impression / Plan:   Impression: 1.  Abdominal pain: Acute on chronic, EGD was planned later this month with Dr. Lyndel Safe, recently normal CT abdomen pelvis as well as multiple normal ultrasounds and labs; consider gastritis vs  functional dyspepsia versus GERD versus biliary colic 2.  Nausea: With above, now with increased episodes of vomiting over the past few days 3.  A. fib: Maintained on Eliquis which has been held, he has been continued on Lovenox since admission 4.  COPD 5.  CAD 6.  GERD   Plan: 1.  Continue PPI twice daily and Carafate 3 times daily 2.  Patient is scheduled for an EGD tomorrow with Dr. Hilarie Fredrickson.  Did discuss risk, notes, limitations and alternatives and the patient agrees to proceed. 3.  Patient will be n.p.o. after midnight 4.  Hold Lovenox injection tonight 5.  Continue antiemetics 6.  Please await any further recommendations from Dr. Hilarie Fredrickson later today.  Thank you for your kind consultation, we will continue to follow.  Lavone Nian Corianne Buccellato  09/09/2018, 9:29 AM

## 2018-09-09 NOTE — Progress Notes (Signed)
Initial Nutrition Assessment  DOCUMENTATION CODES:   Not applicable  INTERVENTION:   Diet advancement per MD Recommend nutritional supplements once diet is advanced  NUTRITION DIAGNOSIS:   Inadequate oral intake related to nausea, vomiting(pain with eating) as evidenced by per patient/family report.  GOAL:   Patient will meet greater than or equal to 90% of their needs   MONITOR:   PO intake, Labs, Weight trends, Diet advancement, I & O's  REASON FOR ASSESSMENT:   Malnutrition Screening Tool    ASSESSMENT:   84 y.o. male with a past medical history as listed below including A. fib on Eliquis, COPD and CAD (08/30/2015 echo LVEF 55-60%), who follows with Dr. grouped and presented to the ER on 09/08/2018 with a complaint of abdominal pain and nausea.  Patient currently coughing and not wanting to take much in of clear liquids. Will be NPO tonight for EGD tomorrow. Once results of EGD are complete, will provide appropriate nutrition intervention. Pt was having N/V and pain with eating PTA.   Per weight records, pt has lost 12 lb since 8/20 (7% wt loss x 2 months, significant for time frame).   Labs reviewed. Medications: Vitamin D tablet daily, Folic acid tablet daily, Multivitamin with minerals daily, D5-.45% NaCl infusion at 75 ml/hr, IV Zofran PRN  NUTRITION - FOCUSED PHYSICAL EXAM:  Deferred.   Diet Order:   Diet Order            Diet NPO time specified  Diet effective midnight        Diet clear liquid Room service appropriate? Yes; Fluid consistency: Thin  Diet effective now              EDUCATION NEEDS:   Not appropriate for education at this time  Skin:  Skin Assessment: Reviewed RN Assessment  Last BM:  10/13  Height:   Ht Readings from Last 1 Encounters:  09/08/18 6\' 2"  (1.88 m)    Weight:   Wt Readings from Last 1 Encounters:  09/08/18 65.4 kg    Ideal Body Weight:  86.3 kg  BMI:  Body mass index is 18.51 kg/m.  Estimated  Nutritional Needs:   Kcal:  8295-6213  Protein:  85-95g  Fluid:  1.9L/day  Clayton Bibles, MS, RD, LDN [place] Dietitian Pager: [phone] After Hours Pager: [phone]

## 2018-09-09 NOTE — Care Management Obs Status (Signed)
[place] NOTIFICATION   Patient Details  Name: [name] MRN: [id number] Date of Birth: 16-Jul-1930   Medicare Observation Status Notification Given:  Yes    Guadalupe Maple, RN 09/09/2018, 2:04 PM

## 2018-09-09 NOTE — H&P (View-Only) (Signed)
Consultation  Referring Provider: Dr. Tawanna Solo    Primary Care Physician:  Colon Branch, MD Primary Gastroenterologist: Dr. Lyndel Safe      Reason for Consultation: Chronic nausea and abdominal pain            HPI:   [name] is a 84 y.o. male with a past medical history as listed below including A. fib on Eliquis, COPD and CAD (08/30/2015 echo LVEF 55-60%), who follows with Dr. Arvid Right and presented to the ER on 09/08/2018 with a complaint of abdominal pain and nausea.    07/16/2018 office visit with Dr. Lyndel Safe to discuss history of long-standing nausea which occurred mostly around 10:30/11 almost every day, history of Multaq at around 7:00/8:00 AM.  He did not have nausea after taking his evening doses as he went to bed afterwards.  Denied significant vomiting.  Had lost 5 pounds over the past 6 months.  Zofran did help.  At that time it was discussed his chronic nausea was likely due to medications like Multaq.  Patient had a negative ultrasound, normal CBC, CMP, TSH and PSA in 2019.  He was given Zofran 4 mg ODT as needed and an EGD was ordered for further evaluation, this was scheduled 09/18/2018.    Today, patient explains that he came to the ER due to worsening abdominal pain and nausea.  Explained he was unable to eat without significant pain as well as nausea with occasional vomiting.  Patient is very histrionic and it is hard to get him to pinpoint exactly what was going on but in general things were getting worse.  Describes an epigastric burning pain which was even made worse 2 days ago after drinking some chicken broth inadvertently with peppers in it.  Had worsening nausea which was increasing in frequency and unhelped by Zofran, as well as this pain.  Was taking Pantoprazole 40 mg daily and Carafate 4 times daily.  Describes vague history of having EGD many years ago for similar symptoms and being told he had "inflammation of the lining of my stomach".  Associated symptoms include an  increase in heartburn recently.    Denies fever, chills, weight loss, anorexia or change in bowel habits.    ED Course: Temperature is 98.9 blood pressure 147/66 pulse 78 respiratory rate of 16 with oxygen sat 99% room air.  White count is 6.6 IMA globin 13.2 and platelets 180.  Sodium 135 potassium 3.8 and chloride 95 CO2 26 with creatinine 0.88.  Lactic acid 1.44 on glucose 97.  Urinalysis showed moderate hematuria but negative white count.  Abdominal ultrasound showed unremarkable finding of the gallbladder with some liver hemangioma.  Past Medical History:  Diagnosis Date  . Anxiety   . Atrial fibrillation ([place])   . Blood transfusion without reported diagnosis   . CAD    a. 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  . Cataract   . Coronary artery disease   . Diverticulosis 2011   Diverticulitis 2004  . Esophageal reflux   . Headache    saw neuro 4-16, MRI done  . HYPERPLASIA, PROSTATE NOS W/URINARY OBST/LUTS   . IBS   . INGUINAL HERNIA   . Lichen planus [id number]   [name]  . LUMBAR RADICULOPATHY, RIGHT   . Mixed hyperlipidemia   . OSTEOPOROSIS   . PONV (postoperative nausea and vomiting)   . PREMATURE VENTRICULAR CONTRACTIONS  a. After CABG - was on Coumadin/Multaq for a period of time. Coumadin discontinued 04/2010 after maintaining NSR.  [name] UNSPECIFIED ANEMIA     Past Surgical History:  Procedure Laterality Date  . CATARACT EXTRACTION Right 10/06/2013  . CORONARY ARTERY BYPASS GRAFT     2 VD;anomalous RCA;post op compliacted by AF  . HIP FRACTURE SURGERY Right 03/2007   Dr. Salvadore Farber  . INGUINAL HERNIA REPAIR Left 2012   Dr Brantley Stage  . KNEE SURGERY Right 1977   repair  . PROSTATE BIOPSY  09/19/2013   [facility]    Family History  Problem Relation Age of Onset  . Stroke Mother   . Pancreatic cancer Father        Deceased, 44  . Breast cancer Sister        Deceased  . Heart  disease Sister 50  . Stroke Maternal Aunt   . Colon cancer Neg Hx   . Prostate cancer Neg Hx   . Esophageal cancer Neg Hx   . Rectal cancer Neg Hx   . Stomach cancer Neg Hx      Social History   Tobacco Use  . Smoking status: Former Smoker    Types: Cigarettes    Last attempt to quit: 11/27/1962    Years since quitting: 55.8  . Smokeless tobacco: Never Used  Substance Use Topics  . Alcohol use: Not Currently    Alcohol/week: 0.0 standard drinks    Comment: quit drinking   . Drug use: No    Prior to Admission medications   Medication Sig Start Date End Date Taking? Authorizing Provider  aspirin 81 MG tablet Take 81 mg by mouth daily.   Yes [provider]  Calcium Carbonate Antacid (TUMS PO) Take 1 tablet by mouth 2 (two) times daily as needed (heartburn). For calcium   Yes [provider]  cholecalciferol (VITAMIN D) 1000 UNITS tablet Take 1,000 Units by mouth every morning.     Yes [provider]  denosumab (PROLIA) 60 MG/ML SOSY injection Inject 60 mg into the skin every 6 (six) months.   Yes [provider]  dronedarone (MULTAQ) 400 MG tablet TAKE ONE TABLET TWICE DAILY WITH A MEAL Patient taking differently: Take 400 mg by mouth 2 (two) times daily with a meal.  12/11/17  Yes Nishan, Wallis Bamberg, MD  ELIQUIS 5 MG TABS tablet TAKE ONE TABLET BY MOUTH TWICE DAILY Patient taking differently: Take 5 mg by mouth 2 (two) times daily.  09/02/18  Yes Josue Hector, MD  folic acid (FOLVITE) 1 MG tablet Take 1 mg by mouth daily.    Yes [provider]  loratadine (CLARITIN) 10 MG tablet Take 10 mg by mouth daily.    Yes [provider]  Multiple Vitamin (MULITIVITAMIN WITH MINERALS) TABS Take 1 tablet by mouth daily.     Yes [provider]  nitroGLYCERIN (NITROSTAT) 0.4 MG SL tablet Place 1 tablet (0.4 mg total) under the tongue every 5 (five) minutes x 3 doses as needed for chest pain. 08/21/17  Yes Paz, Alda Berthold, MD    pantoprazole (PROTONIX) 40 MG tablet Take 1 tablet (40 mg total) by mouth daily. 08/14/18  Yes Debbrah Alar, NP  rosuvastatin (CRESTOR) 10 MG tablet Take 10 mg by mouth daily.    Yes [provider]  sucralfate (CARAFATE) 1 GM/10ML suspension Take 10 mLs (1 g total) by mouth 4 (four) times daily -  with meals and at bedtime. 08/28/18  Yes  Colon Branch, MD  doxycycline (VIBRA-TABS) 100 MG tablet Take 1 tablet (100 mg total) by mouth 2 (two) times daily. Patient not taking: Reported on 09/05/2018 08/20/18   Colon Branch, MD  ondansetron (ZOFRAN ODT) 4 MG disintegrating tablet Take 1 tablet (4 mg total) by mouth every 8 (eight) hours as needed for nausea or vomiting. Patient not taking: Reported on 09/05/2018 08/16/18   Quintella Reichert, MD    Current Facility-Administered Medications  Medication Dose Route Frequency Provider Last Rate Last Dose  . aspirin EC tablet 81 mg  81 mg Oral Daily Gala Romney L, MD      . calcium carbonate (TUMS - dosed in mg elemental calcium) chewable tablet 200 mg of elemental calcium  1 tablet Oral BID PRN Elwyn Reach, MD      . cholecalciferol (VITAMIN D) tablet 1,000 Units  1,000 Units Oral Daily Garba, Mohammad L, MD      . dextrose 5 %-0.45 % sodium chloride infusion   Intravenous Continuous Elwyn Reach, MD 75 mL/hr at 09/08/18 2315    . dronedarone (MULTAQ) tablet 400 mg  400 mg Oral BID WC Gala Romney L, MD   400 mg at 09/09/18 0821  . enoxaparin (LOVENOX) injection 40 mg  40 mg Subcutaneous QHS Gala Romney L, MD   40 mg at 09/08/18 2315  . folic acid (FOLVITE) tablet 1 mg  1 mg Oral Daily Jonelle Sidle, Mohammad L, MD      . loratadine (CLARITIN) tablet 10 mg  10 mg Oral Daily Elwyn Reach, MD      . multivitamin with minerals tablet 1 tablet  1 tablet Oral Daily Garba, Mohammad L, MD      . nitroGLYCERIN (NITROSTAT) SL tablet 0.4 mg  0.4 mg Sublingual Q5 Min x 3 PRN Jonelle Sidle, Mohammad L, MD      . ondansetron (ZOFRAN) tablet 4 mg  4  mg Oral Q6H PRN Elwyn Reach, MD       Or  . ondansetron (ZOFRAN) injection 4 mg  4 mg Intravenous Q6H PRN Elwyn Reach, MD   4 mg at 09/09/18 0827  . pantoprazole (PROTONIX) EC tablet 40 mg  40 mg Oral BID Shelly Coss, MD      . rosuvastatin (CRESTOR) tablet 10 mg  10 mg Oral Daily Garba, Mohammad L, MD      . sucralfate (CARAFATE) 1 GM/10ML suspension 1 g  1 g Oral TID WC & HS Elwyn Reach, MD   1 g at 09/09/18 6712    Allergies as of 09/08/2018 - Review Complete 09/08/2018  Allergen Reaction Noted  . Zoloft [sertraline hcl]  07/16/2018  . Remeron [mirtazapine] Other (See Comments) 08/08/2016     Review of Systems:    Constitutional: No fever or chills Skin: No rash  Cardiovascular: No chest pain Respiratory: No SOB  Gastrointestinal: See HPI and otherwise negative Genitourinary: No dysuria  Neurological: No headache, dizziness or syncope Musculoskeletal: No new muscle or joint pain Hematologic: No bleeding  Psychiatric: No history of depression or anxiety    Physical Exam:  Vital signs in last 24 hours: Temp:  [98 F (36.7 C)-98.9 F (37.2 C)] 98 F (36.7 C) (10/14 4580) Pulse Rate:  [70-86] 86 (10/14 0613) Resp:  [14-18] 18 (10/14 0613) BP: (122-147)/(65-69) 122/65 (10/14 0613) SpO2:  [97 %-100 %] 97 % (10/14 0613) Weight:  [65.4 kg] 65.4 kg (10/13 2157) Last BM Date: 09/08/18 General:   Pleasant elderly Caucasian  male appears to be in NAD, Well developed, Well nourished, alert and cooperative Head:  Normocephalic and atraumatic. Eyes:   PEERL, EOMI. No icterus. Conjunctiva pink. Ears:  Normal auditory acuity. Neck:  Supple Throat: Oral cavity and pharynx without inflammation, swelling or lesion. Poor dentition (hx of partial dentures at home) Lungs: Respirations even and unlabored. Lungs clear to auscultation bilaterally.   No wheezes, crackles, or rhonchi.  Heart: Normal S1, S2. No MRG. Regular rate and rhythm. No peripheral edema, cyanosis or  pallor.  Abdomen:  Soft, nondistended, nontender. No rebound or guarding. Normal bowel sounds. No appreciable masses or hepatomegaly. Rectal:  Not performed.  Msk:  Symmetrical without gross deformities. Peripheral pulses intact.  Extremities:  Without edema, no deformity or joint abnormality.  Neurologic:  Alert and  oriented x4;  grossly normal neurologically.  Skin:   Dry and intact without significant lesions or rashes. Psychiatric: Demonstrates good judgement and reason without abnormal affect or behaviors.   LAB RESULTS: Recent Labs    09/08/18 1833 09/09/18 0434  WBC 6.6 5.0  HGB 13.2 13.1  HCT 40.7 40.4  PLT 180 169   BMET Recent Labs    09/08/18 1833 09/09/18 0434  NA 135 136  K 3.8 4.4  CL 95* 100  CO2 26 25  GLUCOSE 97 110*  BUN 11 10  CREATININE 0.88 0.83  CALCIUM 9.2 8.8*   LFT Recent Labs    09/09/18 0434  PROT 6.4*  ALBUMIN 3.5  AST 22  ALT 15  ALKPHOS 39  BILITOT 0.3    STUDIES: US Abdomen Limited Ruq  Result Date: 09/08/2018 CLINICAL DATA:  Right upper quadrant pain for 2 months EXAM: ULTRASOUND ABDOMEN LIMITED RIGHT UPPER QUADRANT COMPARISON:  None. FINDINGS: Gallbladder: No gallstones or wall thickening visualized. No sonographic Murphy sign noted by sonographer. Common bile duct: Diameter: 2.5 mm Liver: Echogenic liver lesion in the left hepatic lobe measuring 1.3 x 1 x 1.1 cm consistent with a small hemangioma. Portal vein is patent on color Doppler imaging with normal direction of blood flow towards the liver. IMPRESSION: Unremarkable appearance of the gallbladder. Echogenic liver lesion measuring 1.1 x 1 x 1.1 cm compatible with a hemangioma. Electronically Signed   By: Ashley Royalty M.D.   On: 09/08/2018 22:09    Impression / Plan:   Impression: 1.  Abdominal pain: Acute on chronic, EGD was planned later this month with Dr. Lyndel Safe, recently normal CT abdomen pelvis as well as multiple normal ultrasounds and labs; consider gastritis vs  functional dyspepsia versus GERD versus biliary colic 2.  Nausea: With above, now with increased episodes of vomiting over the past few days 3.  A. fib: Maintained on Eliquis which has been held, he has been continued on Lovenox since admission 4.  COPD 5.  CAD 6.  GERD   Plan: 1.  Continue PPI twice daily and Carafate 3 times daily 2.  Patient is scheduled for an EGD tomorrow with Dr. Hilarie Fredrickson.  Did discuss risk, notes, limitations and alternatives and the patient agrees to proceed. 3.  Patient will be n.p.o. after midnight 4.  Hold Lovenox injection tonight 5.  Continue antiemetics 6.  Please await any further recommendations from Dr. Hilarie Fredrickson later today.  Thank you for your kind consultation, we will continue to follow.  Lavone Nian Dashanae Longfield  09/09/2018, 9:29 AM

## 2018-09-10 ENCOUNTER — Observation Stay (HOSPITAL_COMMUNITY): Payer: Medicare Other | Admitting: Anesthesiology

## 2018-09-10 ENCOUNTER — Telehealth: Payer: Self-pay

## 2018-09-10 ENCOUNTER — Encounter (HOSPITAL_COMMUNITY)
Admission: EM | Disposition: A | Discharge status: Home / Self Care | Payer: Self-pay | Source: Home / Self Care | Attending: Emergency Medicine

## 2018-09-10 ENCOUNTER — Encounter (HOSPITAL_COMMUNITY): Payer: Self-pay | Admitting: Anesthesiology

## 2018-09-10 ENCOUNTER — Ambulatory Visit: Payer: Medicare Other | Admitting: Internal Medicine

## 2018-09-10 ENCOUNTER — Observation Stay (HOSPITAL_COMMUNITY): Payer: Medicare Other

## 2018-09-10 DIAGNOSIS — K2970 Gastritis, unspecified, without bleeding: Secondary | ICD-10-CM | POA: Diagnosis not present

## 2018-09-10 DIAGNOSIS — R509 Fever, unspecified: Secondary | ICD-10-CM | POA: Diagnosis not present

## 2018-09-10 DIAGNOSIS — K219 Gastro-esophageal reflux disease without esophagitis: Secondary | ICD-10-CM | POA: Diagnosis not present

## 2018-09-10 DIAGNOSIS — R110 Nausea: Secondary | ICD-10-CM | POA: Diagnosis not present

## 2018-09-10 DIAGNOSIS — K449 Diaphragmatic hernia without obstruction or gangrene: Secondary | ICD-10-CM | POA: Diagnosis not present

## 2018-09-10 DIAGNOSIS — R1010 Upper abdominal pain, unspecified: Secondary | ICD-10-CM | POA: Diagnosis not present

## 2018-09-10 DIAGNOSIS — I480 Paroxysmal atrial fibrillation: Secondary | ICD-10-CM | POA: Diagnosis not present

## 2018-09-10 DIAGNOSIS — K2980 Duodenitis without bleeding: Secondary | ICD-10-CM

## 2018-09-10 DIAGNOSIS — J479 Bronchiectasis, uncomplicated: Secondary | ICD-10-CM | POA: Diagnosis not present

## 2018-09-10 DIAGNOSIS — K2950 Unspecified chronic gastritis without bleeding: Secondary | ICD-10-CM | POA: Diagnosis not present

## 2018-09-10 DIAGNOSIS — R1013 Epigastric pain: Secondary | ICD-10-CM

## 2018-09-10 HISTORY — PX: ESOPHAGOGASTRODUODENOSCOPY (EGD) WITH PROPOFOL: SHX5813

## 2018-09-10 HISTORY — PX: BIOPSY: SHX5522

## 2018-09-10 SURGERY — ESOPHAGOGASTRODUODENOSCOPY (EGD) WITH PROPOFOL
Anesthesia: Monitor Anesthesia Care

## 2018-09-10 MED ORDER — PROPOFOL 500 MG/50ML IV EMUL
INTRAVENOUS | Status: DC | PRN
  Administered 2018-09-10: 120 ug/kg/min via INTRAVENOUS

## 2018-09-10 MED ORDER — PROPOFOL 10 MG/ML IV BOLUS
INTRAVENOUS | Status: DC | PRN
  Administered 2018-09-10 (×2): 20 mg via INTRAVENOUS

## 2018-09-10 MED ORDER — LACTATED RINGERS IV SOLN
INTRAVENOUS | Status: DC | PRN
  Administered 2018-09-10: 13:00:00 via INTRAVENOUS

## 2018-09-10 MED ORDER — PANTOPRAZOLE SODIUM 40 MG PO TBEC
40.0000 mg | DELAYED_RELEASE_TABLET | Freq: Every day | ORAL | Status: DC
  Administered 2018-09-11: 40 mg via ORAL
  Filled 2018-09-10 (×2): qty 1

## 2018-09-10 MED ORDER — PHENYLEPHRINE 40 MCG/ML (10ML) SYRINGE FOR IV PUSH (FOR BLOOD PRESSURE SUPPORT)
PREFILLED_SYRINGE | INTRAVENOUS | Status: DC | PRN
  Administered 2018-09-10 (×2): 80 ug via INTRAVENOUS

## 2018-09-10 MED ORDER — PROPOFOL 10 MG/ML IV BOLUS
INTRAVENOUS | Status: AC
  Filled 2018-09-10: qty 40

## 2018-09-10 MED ORDER — LIDOCAINE 2% (20 MG/ML) 5 ML SYRINGE
INTRAMUSCULAR | Status: DC | PRN
  Administered 2018-09-10: 100 mg via INTRAVENOUS

## 2018-09-10 SURGICAL SUPPLY — 15 items

## 2018-09-10 NOTE — Telephone Encounter (Signed)
appt has been cancelled for EGD and appt made for follow up

## 2018-09-10 NOTE — Op Note (Signed)
[facility] Patient Name: [name] Procedure Date: 09/10/2018 MRN: [id number] Attending MD: Jerene Bears , MD Date of Birth: 1930-07-21 CSN: [id number] Age: 84 Admit Type: Inpatient Procedure:                Upper GI endoscopy Indications:              Epigastric abdominal pain, Nausea Providers:                Lajuan Lines. Hilarie Fredrickson, MD, Zenon Mayo, RN, Charolette Child,                            Technician, Danley Danker, CRNA Referring MD:             [facility] Medicines:                Monitored Anesthesia Care Complications:            No immediate complications. Estimated Blood Loss:     Estimated blood loss was minimal. Procedure:                Pre-Anesthesia Assessment:                           - Prior to the procedure, a History and Physical                            was performed, and patient medications and                            allergies were reviewed. The patient's tolerance of                            previous anesthesia was also reviewed. The risks                            and benefits of the procedure and the sedation                            options and risks were discussed with the patient.                            All questions were answered, and informed consent                            was obtained. Prior Anticoagulants: The patient has                            taken no previous anticoagulant or antiplatelet                            agents. ASA Grade Assessment: III - A patient with                            severe systemic disease. After reviewing the risks  and benefits, the patient was deemed in                            satisfactory condition to undergo the procedure.                           After obtaining informed consent, the endoscope was                            passed under direct vision. Throughout the                            procedure, the patient's blood pressure, pulse,  and                            oxygen saturations were monitored continuously. The                            GIF-H190 ([id number] Olympus adult endoscope was                            introduced through the mouth, and advanced to the                            second part of duodenum. The upper GI endoscopy was                            accomplished without difficulty. The patient                            tolerated the procedure well. Scope In: Scope Out: Findings:      The examined esophagus was normal.      A 1 cm hiatal hernia was present.      Localized mild inflammation characterized by erythema was found at the       incisura. Biopsies were taken with a cold forceps for histology and       Helicobacter pylori testing.      The examined duodenum was normal. Biopsies were taken with a cold       forceps for histology. Impression:               - Normal esophagus.                           - 1 cm hiatal hernia.                           - Mild gastritis. Biopsied.                           - Normal examined duodenum. Biopsied. Moderate Sedation:      N/A Recommendation:           - Return patient to hospital ward for ongoing care.                           - Advance  diet as tolerated.                           - Await pathology results.                           - Previous abdominal US and abdomen/pelvis CT                            unrevealing for source of abdominal pain and                            nausea. If biopsies unrevealing, then would                            recommend discussion with cardiology about                            alternative medication for Multaq (as this                            medication is associated with epigastric pain,                            dyspepsia, and nausea). Procedure Code(s):        --- Professional ---                           [phone], Esophagogastroduodenoscopy, flexible,                            transoral; with biopsy,  single or multiple Diagnosis Code(s):        --- Professional ---                           K44.9, Diaphragmatic hernia without obstruction or                            gangrene                           K29.70, Gastritis, unspecified, without bleeding                           R10.13, Epigastric pain                           R11.0, Nausea CPT copyright 2018 [employer]. All rights reserved. The codes documented in this report are preliminary and upon coder review may  be revised to meet current compliance requirements. Jerene Bears, MD 09/10/2018 2:29:03 PM This report has been signed electronically. Number of Addenda: 0

## 2018-09-10 NOTE — Transfer of Care (Signed)
Immediate Anesthesia Transfer of Care Note  Patient: [name]  Procedure(s) Performed: ESOPHAGOGASTRODUODENOSCOPY (EGD) WITH PROPOFOL (N/A ) BIOPSY  Patient Location: Endoscopy Unit  Anesthesia Type:MAC  Level of Consciousness: awake  Airway & Oxygen Therapy: Patient Spontanous Breathing and Patient connected to nasal cannula oxygen  Post-op Assessment: Report given to RN and Post -op Vital signs reviewed and stable  Post vital signs: Reviewed and stable  Last Vitals:  Vitals Value Taken Time  BP    Temp    Pulse    Resp    SpO2      Last Pain:  Vitals:   09/10/18 1316  TempSrc: Oral  PainSc: 0-No pain         Complications: No apparent anesthesia complications

## 2018-09-10 NOTE — Anesthesia Procedure Notes (Signed)
Date/Time: 09/10/2018 1:46 PM Performed by: Sharlette Dense, CRNA Oxygen Delivery Method: Nasal cannula

## 2018-09-10 NOTE — Progress Notes (Signed)
PROGRESS NOTE    [name]  RZN:[id number] DOB: 1930/04/04 DOA: 09/08/2018 PCP: Colon Branch, MD   Brief Narrative: Patient is 84 year old male with past medical history of chronic abdominal pain, chronic nausea, coronary artery disease, A. fib, hyperlipidemia, GERD who presented to the emergency department for evaluation of worsening abdominal pain, nausea and not able to eat anything by mouth. GI  evaluated him and he underwent EGD which just showed 1 cm hiatal hernia, mild gastritis, normal duodenum.  Biopsies were taken.  GI had cleared him for discharge but he had a low grade fever .Plan is to monitor him tonight and possibly discharge tomorrow..  Assessment & Plan:   Principal Problem:   Abdominal pain Active Problems:   Mixed hyperlipidemia   Coronary atherosclerosis   Esophageal reflux   IBS   Paroxysmal A-fib (HCC)   COPD (chronic obstructive pulmonary disease) (HCC)   Abdominal pain, epigastric  Abdominal pain: Acute on chronic.  He denied any abdominal pain, nausea or vomiting this morning.EGD findings as above. Patient has this pain on and off and had multiple imagings done in the past withoutany significant abnormalities. Continue PPI and carafate. He will follow-up with gastroenterology as an outpatient.  Fever: Source unknown. Denied any dysuria.His UA was fine on presentation. Denied any cough and shortness of breath. Will get a chest Xray.white cell counts normal.Will monitor him tonight .Willnot start any antibiotics for now.Low suspicion for any infectious process,likley reactiv e.If he continues to be febrile,plese send blood cultures and start on empiric antibiotics.  Nausea:Improved with Zofran.  Paroxysmal A. fib: Currently in normal sinus rhythm.  He is on Eliquis at home. Continue Multaq.  GERD: Continue PPI and Carafate  Hyperlipidemia:Continue Crestor  History of COPD: Not on exacerbation. Stable  History of coronary disease: Denies  any chest pain. Continue home medications.   DVT prophylaxis: Lovenox Code Status: Full Family Communication: None at the bed side Disposition Plan: Home after EGD   Consultants: GI  Procedures:None  Antimicrobials:None  Subjective: Patient seen and examined the bedside this morning.  He had improved abdominal pain, nausea and vomiting.  Underwent EGD with the above findings.  Discussed with Dr. Hilarie Fredrickson, GI about the findings and we agreed on discharging the patient with plan to follow-up as an outpatient.But dc held after he had low grade temperature.His temp was normal this morning.  Objective: Vitals:   09/10/18 1410 09/10/18 1420 09/10/18 1430 09/10/18 1557  BP: (!) 85/44 127/70 (!) 141/54 (!) 148/66  Pulse: 83 73 71 82  Resp: 13 20 19 16   Temp: (!) 100.4 F (38 C)   100 F (37.8 C)  TempSrc: Oral   Oral  SpO2: 100% 100% 100% 100%  Weight:      Height:        Intake/Output Summary (Last 24 hours) at 09/10/2018 1618 Last data filed at 09/10/2018 1558 Gross per 24 hour  Intake 1497.64 ml  Output 1650 ml  Net -152.36 ml   Filed Weights   09/08/18 2157  Weight: 65.4 kg    Examination:  General exam: Appears calm and comfortable ,Not in distress,thin built HEENT:PERRL,Oral mucosa moist, Ear/Nose normal on gross exam Respiratory system: Bilateral decreased air entry on the bases, no wheezes or crackles  Cardiovascular system: S1 & S2 heard, RRR. No JVD, murmurs, rubs, gallops or clicks. No pedal edema. Gastrointestinal system: Abdomen is nondistended, soft and nontender. No organomegaly or masses felt. Normal bowel sounds heard. Central nervous system: Alert and oriented.  No focal neurological deficits. Extremities: No edema, no clubbing ,no cyanosis, distal peripheral pulses palpable. Skin: No rashes, lesions or ulcers,no icterus ,no pallor MSK: Normal muscle bulk,tone ,power Psychiatry: Judgement and insight appear normal. Mood & affect appropriate.      Data Reviewed: I have personally reviewed following labs and imaging studies  CBC: Recent Labs  Lab 09/08/18 1833 09/09/18 0434  WBC 6.6 5.0  HGB 13.2 13.1  HCT 40.7 40.4  MCV 92.9 91.6  PLT 180 149   Basic Metabolic Panel: Recent Labs  Lab 09/08/18 1833 09/09/18 0434  NA 135 136  K 3.8 4.4  CL 95* 100  CO2 26 25  GLUCOSE 97 110*  BUN 11 10  CREATININE 0.88 0.83  CALCIUM 9.2 8.8*   GFR: Estimated Creatinine Clearance: 60.2 mL/min (by C-G formula based on SCr of 0.83 mg/dL). Liver Function Tests: Recent Labs  Lab 09/08/18 1833 09/09/18 0434  AST 28 22  ALT 19 15  ALKPHOS 41 39  BILITOT 0.6 0.3  PROT 7.0 6.4*  ALBUMIN 3.8 3.5   Recent Labs  Lab 09/08/18 1833 09/09/18 0434  LIPASE 37 35   No results for input(s): AMMONIA in the last 168 hours. Coagulation Profile: No results for input(s): INR, PROTIME in the last 168 hours. Cardiac Enzymes: No results for input(s): CKTOTAL, CKMB, CKMBINDEX, TROPONINI in the last 168 hours. BNP (last 3 results) No results for input(s): PROBNP in the last 8760 hours. HbA1C: No results for input(s): HGBA1C in the last 72 hours. CBG: No results for input(s): GLUCAP in the last 168 hours. Lipid Profile: No results for input(s): CHOL, HDL, LDLCALC, TRIG, CHOLHDL, LDLDIRECT in the last 72 hours. Thyroid Function Tests: No results for input(s): TSH, T4TOTAL, FREET4, T3FREE, THYROIDAB in the last 72 hours. Anemia Panel: No results for input(s): VITAMINB12, FOLATE, FERRITIN, TIBC, IRON, RETICCTPCT in the last 72 hours. Sepsis Labs: Recent Labs  Lab 09/08/18 1842  LATICACIDVEN 1.44    No results found for this or any previous visit (from the past 240 hour(s)).       Radiology Studies: US Abdomen Limited Ruq  Result Date: 09/08/2018 CLINICAL DATA:  Right upper quadrant pain for 2 months EXAM: ULTRASOUND ABDOMEN LIMITED RIGHT UPPER QUADRANT COMPARISON:  None. FINDINGS: Gallbladder: No gallstones or wall  thickening visualized. No sonographic Murphy sign noted by sonographer. Common bile duct: Diameter: 2.5 mm Liver: Echogenic liver lesion in the left hepatic lobe measuring 1.3 x 1 x 1.1 cm consistent with a small hemangioma. Portal vein is patent on color Doppler imaging with normal direction of blood flow towards the liver. IMPRESSION: Unremarkable appearance of the gallbladder. Echogenic liver lesion measuring 1.1 x 1 x 1.1 cm compatible with a hemangioma. Electronically Signed   By: Ashley Royalty M.D.   On: 09/08/2018 22:09        Scheduled Meds: . aspirin EC  81 mg Oral Daily  . cholecalciferol  1,000 Units Oral Daily  . dronedarone  400 mg Oral BID WC  . enoxaparin (LOVENOX) injection  40 mg Subcutaneous Q24H  . folic acid  1 mg Oral Daily  . loratadine  10 mg Oral Daily  . multivitamin with minerals  1 tablet Oral Daily  . [START ON 09/11/2018] pantoprazole  40 mg Oral QAC breakfast  . rosuvastatin  10 mg Oral Daily  . sucralfate  1 g Oral TID WC & HS   Continuous Infusions: . dextrose 5 % and 0.45% NaCl 75 mL/hr at 09/10/18 0232  LOS: 0 days    Time spent:25 mins. More than 50% of that time was spent in counseling and/or coordination of care.      Shelly Coss, MD [facility] Pager [phone]  If 7PM-7AM, please contact night-coverage [email or web address] Password [email or web address] 09/10/2018, 4:18 PM

## 2018-09-10 NOTE — Telephone Encounter (Signed)
-----   Message from Levin Erp, [place] sent at 09/10/2018  2:29 PM EDT ----- Regarding: Sorry.. this is the patient Cancel EGD Schedule appt with Lyndel Safe, see previous message  Thanks-JLL

## 2018-09-10 NOTE — Interval H&P Note (Signed)
History and Physical Interval Note: For EGD today as discussed yesterday.  No significant changes overnight. The nature of the procedure, as well as the risks, benefits, and alternatives were carefully and thoroughly reviewed with the patient. Ample time for discussion and questions allowed. The patient understood, was satisfied, and agreed to proceed.     09/10/2018 1:31 PM  [name]  has presented today for surgery, with the diagnosis of Nausea, Epigastric pain  The various methods of treatment have been discussed with the patient and family. After consideration of risks, benefits and other options for treatment, the patient has consented to  Procedure(s): ESOPHAGOGASTRODUODENOSCOPY (EGD) WITH PROPOFOL (N/A) as a surgical intervention .  The patient's history has been reviewed, patient examined, no change in status, stable for surgery.  I have reviewed the patient's chart and labs.  Questions were answered to the patient's satisfaction.     Lajuan Lines Tinslee Klare

## 2018-09-10 NOTE — Telephone Encounter (Signed)
Author following up on prolia, and noticed pt. Had cancelled appointment with Dr. Larose Kells today. Author phoned pt. To attempt to reschedule for at least [place] for prolia, but no answer. Author left detailed VM requesting call back on pt's preference.

## 2018-09-10 NOTE — Discharge Summary (Signed)
Physician Discharge Summary  [name] GYF:[id number] DOB: 09-Jun-1930 DOA: 09/08/2018  PCP: Colon Branch, MD  Admit date: 09/08/2018 Discharge date: 09/10/2018  Admitted From: Home Disposition:  Home  Discharge Condition:Stable CODE STATUS:FULL Diet recommendation: Heart Healthy  Brief/Interim Summary: Patient is 84 year old male with past medical history of chronic abdominal pain, chronic nausea, coronary artery disease, A. fib, hyperlipidemia, GERD who presented to the emergency department for evaluation of worsening abdominal pain, nausea and not able to eat anything by mouth.  GI  evaluated him and he underwent  EGD which just showed 1 cm hiatal hernia, mild gastritis, normal duodenum.  Biopsies were taken.  GI cleared him for discharge.  He will follow-up with GI as an outpatient.  Following problems were addressed during his hospitalization:  Abdominal pain: Acute on chronic.  He denied any abdominal pain, nausea or vomiting this morning. EGD findings as above.  Patient has this pain on and off and had multiple imagings done in the past without any significant abnormalities.  Continue PPI and carafate. He will follow-up with gastroenterology as an outpatient.  Nausea: Improved with Zofran.  Paroxysmal A. fib: Currently in normal sinus rhythm.    He is on Eliquis at home.  Continue Multaq.  GERD: Continue PPI and Carafate  Hyperlipidemia: Continue Crestor  History of COPD: Not on exacerbation.  Stable  History of coronary disease: Denies any chest pain.  Continue home medications.   Discharge Diagnoses:  Principal Problem:   Abdominal pain Active Problems:   Mixed hyperlipidemia   Coronary atherosclerosis   Esophageal reflux   IBS   Paroxysmal A-fib (HCC)   COPD (chronic obstructive pulmonary disease) (HCC)   Abdominal pain, epigastric    Discharge Instructions  Discharge Instructions    Diet - low sodium heart healthy   Complete by:  As directed     Discharge instructions   Complete by:  As directed    1) Continue your home medications including pantoprazole and Carafate. 2)Follow up with your PCP in a week. 3)You will be arranged a follow up by gastroenterology.   Increase activity slowly   Complete by:  As directed      Allergies as of 09/10/2018      Reactions   Zoloft [sertraline Hcl]    Patient didn't like the way it made him feel   Remeron [mirtazapine] Other (See Comments)   Excessive Drowsiness      Medication List    STOP taking these medications   doxycycline 100 MG tablet Commonly known as:  VIBRA-TABS     TAKE these medications   aspirin 81 MG tablet Take 81 mg by mouth daily.   cholecalciferol 1000 units tablet Commonly known as:  VITAMIN D Take 1,000 Units by mouth every morning.   denosumab 60 MG/ML Sosy injection Commonly known as:  PROLIA Inject 60 mg into the skin every 6 (six) months.   dronedarone 400 MG tablet Commonly known as:  MULTAQ TAKE ONE TABLET TWICE DAILY WITH A MEAL What changed:    how much to take  how to take this  when to take this  additional instructions   ELIQUIS 5 MG Tabs tablet Generic drug:  apixaban TAKE ONE TABLET BY MOUTH TWICE DAILY What changed:  how much to take   folic acid 1 MG tablet Commonly known as:  FOLVITE Take 1 mg by mouth daily.   loratadine 10 MG tablet Commonly known as:  CLARITIN Take 10 mg by mouth daily.  multivitamin with minerals Tabs tablet Take 1 tablet by mouth daily.   nitroGLYCERIN 0.4 MG SL tablet Commonly known as:  NITROSTAT Place 1 tablet (0.4 mg total) under the tongue every 5 (five) minutes x 3 doses as needed for chest pain.   ondansetron 4 MG disintegrating tablet Commonly known as:  ZOFRAN-ODT Take 1 tablet (4 mg total) by mouth every 8 (eight) hours as needed for nausea or vomiting.   pantoprazole 40 MG tablet Commonly known as:  PROTONIX Take 1 tablet (40 mg total) by mouth daily.   rosuvastatin 10 MG  tablet Commonly known as:  CRESTOR Take 10 mg by mouth daily.   sucralfate 1 GM/10ML suspension Commonly known as:  CARAFATE Take 10 mLs (1 g total) by mouth 4 (four) times daily -  with meals and at bedtime.   TUMS PO Take 1 tablet by mouth 2 (two) times daily as needed (heartburn). For calcium      Follow-up Information    Colon Branch, MD. Schedule an appointment as soon as possible for a visit in 1 week(s).   Specialty:  Internal Medicine Contact information: [address] [place] [address] [phone]          Allergies  Allergen Reactions  . Zoloft [Sertraline Hcl]     Patient didn't like the way it made him feel  . Remeron [Mirtazapine] Other (See Comments)    Excessive Drowsiness    Consultations: Gastroenterology  Procedures/Studies: Dg Chest 2 View  Result Date: 08/16/2018 CLINICAL DATA:  Left-sided chest pain. EXAM: CHEST - 2 VIEW COMPARISON:  06/30/2016 FINDINGS: Lungs are hyperexpanded. Interstitial markings are diffusely coarsened with chronic features. Small nodular density in the left lung, in a region of architectural distortion, is new in the interval. No pulmonary edema or pleural effusion. Stable midthoracic compression fracture. Telemetry leads overlie the chest. IMPRESSION: 1. Emphysema without acute cardiopulmonary findings. 2. Small nodule left upper lobe. This was not visible on the prior study. CT chest without contrast could be used to further evaluate as clinically warranted. Electronically Signed   By: Misty Stanley M.D.   On: 08/16/2018 16:20   Ct Chest W Contrast  Result Date: 08/16/2018 CLINICAL DATA:  LEFT chest pain beginning this morning, nausea and vomiting. History of reflux disease. EXAM: CT CHEST, ABDOMEN, AND PELVIS WITH CONTRAST TECHNIQUE: Multidetector CT imaging of the chest, abdomen and pelvis was performed following the standard protocol during bolus administration of intravenous contrast. CONTRAST:  133mL  OMNIPAQUE IOHEXOL 300 MG/ML  SOLN COMPARISON:  Chest radiograph August 16, 2018 and abdominal ultrasound June 08, 2018 FINDINGS: CT CHEST FINDINGS CARDIOVASCULAR: Heart size is normal. Severe coronary artery calcifications. Status post CABG. No pericardial effusion. Admixture of contrast RIGHT atrium. Mildly ectatic 3.4 cm ascending aorta. Mild calcific atherosclerosis. MEDIASTINUM/NODES: No mediastinal mass. No lymphadenopathy by CT size criteria. Normal appearance of thoracic esophagus though not tailored for evaluation. LUNGS/PLEURA: Tracheobronchial tree is patent, no pneumothorax. Biapical reticulonodular scarring. Dependent atelectasis. LEFT upper lobe scarring with subpleural nodularity in bronchiectasis. 9 mm LEFT upper lobe anterior segment subpleural consolidation (series 5, image 44). No pleural effusion. MUSCULOSKELETAL: Osteopenia. Moderate T8, mild T9, mild T10, moderate T12 compression fractures. Status post median sternotomy. CT ABDOMEN AND PELVIS FINDINGS HEPATOBILIARY: Calcified hepatic granuloma, 1 cm flash filling hemangioma LEFT lobe of the liver. Normal gallbladder. PANCREAS: 1 cm cyst or lymphangioma, otherwise unremarkable. SPLEEN: Normal. ADRENALS/URINARY TRACT: Kidneys are orthotopic, demonstrating symmetric enhancement. No nephrolithiasis, hydronephrosis or solid  renal masses. The unopacified ureters are normal in course and caliber. Delayed imaging through the kidneys demonstrates symmetric prompt contrast excretion within the proximal urinary collecting system. Urinary bladder is partially distended and unremarkable. Normal adrenal glands. STOMACH/BOWEL: Stomach, small and large bowel are normal in course and caliber without inflammatory changes, sensitivity decreased without oral contrast. Moderate sigmoid colonic diverticulosis. Normal appendix. VASCULAR/LYMPHATIC: Aortoiliac vessels are normal in course and caliber. Mild intimal thickening calcific atherosclerosis. No  lymphadenopathy by CT size criteria. REPRODUCTIVE: Mild prostatomegaly. OTHER: No intraperitoneal free fluid or free air. MUSCULOSKELETAL: Osteopenia. Moderate to severe L1, moderate to severe L2, mild L3, mild L4 and mild-to-moderate L5 compression fractures. RIGHT femur ORIF. Severe lower lumbar facet arthropathy. IMPRESSION: CT CHEST: 1. 9 mm LEFT upper lobe subpleural nodular consolidation seen with pneumonia or, may be, chronic. Biapical and LEFT lung scarring. Consider 3 to six-month follow-up chest CT to verify stability of findings. 2. Osteopenia, multilevel age indeterminate compression fractures, moderate at T8 and T12. CT ABDOMEN AND PELVIS: 1. No acute intra-abdominopelvic process. 2. colonic diverticulosis. 3. Osteopenia, age indeterminate compression fractures all lumbar levels, moderate to severe at L1 and L2. Aortic Atherosclerosis (ICD10-I70.0). Electronically Signed   By: Elon Alas M.D.   On: 08/16/2018 18:57   Ct Abdomen Pelvis W Contrast  Result Date: 08/16/2018 CLINICAL DATA:  LEFT chest pain beginning this morning, nausea and vomiting. History of reflux disease. EXAM: CT CHEST, ABDOMEN, AND PELVIS WITH CONTRAST TECHNIQUE: Multidetector CT imaging of the chest, abdomen and pelvis was performed following the standard protocol during bolus administration of intravenous contrast. CONTRAST:  119mL OMNIPAQUE IOHEXOL 300 MG/ML  SOLN COMPARISON:  Chest radiograph August 16, 2018 and abdominal ultrasound June 08, 2018 FINDINGS: CT CHEST FINDINGS CARDIOVASCULAR: Heart size is normal. Severe coronary artery calcifications. Status post CABG. No pericardial effusion. Admixture of contrast RIGHT atrium. Mildly ectatic 3.4 cm ascending aorta. Mild calcific atherosclerosis. MEDIASTINUM/NODES: No mediastinal mass. No lymphadenopathy by CT size criteria. Normal appearance of thoracic esophagus though not tailored for evaluation. LUNGS/PLEURA: Tracheobronchial tree is patent, no pneumothorax.  Biapical reticulonodular scarring. Dependent atelectasis. LEFT upper lobe scarring with subpleural nodularity in bronchiectasis. 9 mm LEFT upper lobe anterior segment subpleural consolidation (series 5, image 44). No pleural effusion. MUSCULOSKELETAL: Osteopenia. Moderate T8, mild T9, mild T10, moderate T12 compression fractures. Status post median sternotomy. CT ABDOMEN AND PELVIS FINDINGS HEPATOBILIARY: Calcified hepatic granuloma, 1 cm flash filling hemangioma LEFT lobe of the liver. Normal gallbladder. PANCREAS: 1 cm cyst or lymphangioma, otherwise unremarkable. SPLEEN: Normal. ADRENALS/URINARY TRACT: Kidneys are orthotopic, demonstrating symmetric enhancement. No nephrolithiasis, hydronephrosis or solid renal masses. The unopacified ureters are normal in course and caliber. Delayed imaging through the kidneys demonstrates symmetric prompt contrast excretion within the proximal urinary collecting system. Urinary bladder is partially distended and unremarkable. Normal adrenal glands. STOMACH/BOWEL: Stomach, small and large bowel are normal in course and caliber without inflammatory changes, sensitivity decreased without oral contrast. Moderate sigmoid colonic diverticulosis. Normal appendix. VASCULAR/LYMPHATIC: Aortoiliac vessels are normal in course and caliber. Mild intimal thickening calcific atherosclerosis. No lymphadenopathy by CT size criteria. REPRODUCTIVE: Mild prostatomegaly. OTHER: No intraperitoneal free fluid or free air. MUSCULOSKELETAL: Osteopenia. Moderate to severe L1, moderate to severe L2, mild L3, mild L4 and mild-to-moderate L5 compression fractures. RIGHT femur ORIF. Severe lower lumbar facet arthropathy. IMPRESSION: CT CHEST: 1. 9 mm LEFT upper lobe subpleural nodular consolidation seen with pneumonia or, may be, chronic. Biapical and LEFT lung scarring. Consider 3 to six-month follow-up chest CT to verify stability  of findings. 2. Osteopenia, multilevel age indeterminate compression  fractures, moderate at T8 and T12. CT ABDOMEN AND PELVIS: 1. No acute intra-abdominopelvic process. 2. colonic diverticulosis. 3. Osteopenia, age indeterminate compression fractures all lumbar levels, moderate to severe at L1 and L2. Aortic Atherosclerosis (ICD10-I70.0). Electronically Signed   By: Elon Alas M.D.   On: 08/16/2018 18:57   US Abdomen Limited Ruq  Result Date: 09/08/2018 CLINICAL DATA:  Right upper quadrant pain for 2 months EXAM: ULTRASOUND ABDOMEN LIMITED RIGHT UPPER QUADRANT COMPARISON:  None. FINDINGS: Gallbladder: No gallstones or wall thickening visualized. No sonographic Murphy sign noted by sonographer. Common bile duct: Diameter: 2.5 mm Liver: Echogenic liver lesion in the left hepatic lobe measuring 1.3 x 1 x 1.1 cm consistent with a small hemangioma. Portal vein is patent on color Doppler imaging with normal direction of blood flow towards the liver. IMPRESSION: Unremarkable appearance of the gallbladder. Echogenic liver lesion measuring 1.1 x 1 x 1.1 cm compatible with a hemangioma. Electronically Signed   By: Ashley Royalty M.D.   On: 09/08/2018 22:09       Subjective: Patient seen and examined the bedside this morning.  He had improved abdominal pain, nausea and vomiting.  Underwent EGD with the above findings.  Discussed with Dr. Hilarie Fredrickson, GI about the findings and we agreed on discharging the patient with plan to follow-up as an outpatient.  Discharge Exam: Vitals:   09/10/18 1420 09/10/18 1430  BP: 127/70 (!) 141/54  Pulse: 73 71  Resp: 20 19  Temp:    SpO2: 100% 100%   Vitals:   09/10/18 1316 09/10/18 1410 09/10/18 1420 09/10/18 1430  BP: (!) 162/71 (!) 85/44 127/70 (!) 141/54  Pulse: 79 83 73 71  Resp: (!) 26 13 20 19   Temp: 99.6 F (37.6 C) (!) 100.4 F (38 C)    TempSrc: Oral Oral    SpO2: 97% 100% 100% 100%  Weight:      Height:        General: Pt is alert, awake, not in acute distress Cardiovascular: RRR, S1/S2 +, no rubs, no  gallops Respiratory: CTA bilaterally, no wheezing, no rhonchi Abdominal: Soft, NT, ND, bowel sounds + Extremities: no edema, no cyanosis    The results of significant diagnostics from this hospitalization (including imaging, microbiology, ancillary and laboratory) are listed below for reference.     Microbiology: No results found for this or any previous visit (from the past 240 hour(s)).   Labs: BNP (last 3 results) No results for input(s): BNP in the last 8760 hours. Basic Metabolic Panel: Recent Labs  Lab 09/08/18 1833 09/09/18 0434  NA 135 136  K 3.8 4.4  CL 95* 100  CO2 26 25  GLUCOSE 97 110*  BUN 11 10  CREATININE 0.88 0.83  CALCIUM 9.2 8.8*   Liver Function Tests: Recent Labs  Lab 09/08/18 1833 09/09/18 0434  AST 28 22  ALT 19 15  ALKPHOS 41 39  BILITOT 0.6 0.3  PROT 7.0 6.4*  ALBUMIN 3.8 3.5   Recent Labs  Lab 09/08/18 1833 09/09/18 0434  LIPASE 37 35   No results for input(s): AMMONIA in the last 168 hours. CBC: Recent Labs  Lab 09/08/18 1833 09/09/18 0434  WBC 6.6 5.0  HGB 13.2 13.1  HCT 40.7 40.4  MCV 92.9 91.6  PLT 180 169   Cardiac Enzymes: No results for input(s): CKTOTAL, CKMB, CKMBINDEX, TROPONINI in the last 168 hours. BNP: Invalid input(s): POCBNP CBG: No results for input(s): GLUCAP  in the last 168 hours. D-Dimer No results for input(s): DDIMER in the last 72 hours. Hgb A1c No results for input(s): HGBA1C in the last 72 hours. Lipid Profile No results for input(s): CHOL, HDL, LDLCALC, TRIG, CHOLHDL, LDLDIRECT in the last 72 hours. Thyroid function studies No results for input(s): TSH, T4TOTAL, T3FREE, THYROIDAB in the last 72 hours.  Invalid input(s): FREET3 Anemia work up No results for input(s): VITAMINB12, FOLATE, FERRITIN, TIBC, IRON, RETICCTPCT in the last 72 hours. Urinalysis    Component Value Date/Time   COLORURINE YELLOW 09/08/2018 1833   APPEARANCEUR CLEAR 09/08/2018 1833   LABSPEC 1.011 09/08/2018 1833    PHURINE 6.0 09/08/2018 1833   GLUCOSEU NEGATIVE 09/08/2018 1833   GLUCOSEU NEGATIVE 01/14/2016 1020   HGBUR MODERATE (A) 09/08/2018 1833   HGBUR negative 01/10/2011 1435   BILIRUBINUR NEGATIVE 09/08/2018 1833   BILIRUBINUR neg 08/08/2013 0916   KETONESUR 80 (A) 09/08/2018 1833   PROTEINUR NEGATIVE 09/08/2018 1833   UROBILINOGEN 0.2 01/14/2016 1020   NITRITE NEGATIVE 09/08/2018 1833   LEUKOCYTESUR NEGATIVE 09/08/2018 1833   Sepsis Labs Invalid input(s): PROCALCITONIN,  WBC,  LACTICIDVEN Microbiology No results found for this or any previous visit (from the past 240 hour(s)).  Please note: You were cared for by a hospitalist during your hospital stay. Once you are discharged, your primary care physician will handle any further medical issues. Please note that NO REFILLS for any discharge medications will be authorized once you are discharged, as it is imperative that you return to your primary care physician (or establish a relationship with a primary care physician if you do not have one) for your post hospital discharge needs so that they can reassess your need for medications and monitor your lab values.    Time coordinating discharge: 40 minutes  SIGNED:   Shelly Coss, MD  [facility] 09/10/2018, 2:44 PM Pager [phone]  If 7PM-7AM, please contact night-coverage [email or web address] Password [email or web address]

## 2018-09-11 ENCOUNTER — Encounter (HOSPITAL_COMMUNITY): Payer: Self-pay | Admitting: Internal Medicine

## 2018-09-11 DIAGNOSIS — G8929 Other chronic pain: Secondary | ICD-10-CM | POA: Diagnosis not present

## 2018-09-11 DIAGNOSIS — K219 Gastro-esophageal reflux disease without esophagitis: Secondary | ICD-10-CM | POA: Diagnosis not present

## 2018-09-11 DIAGNOSIS — R109 Unspecified abdominal pain: Secondary | ICD-10-CM | POA: Diagnosis not present

## 2018-09-11 DIAGNOSIS — I480 Paroxysmal atrial fibrillation: Secondary | ICD-10-CM | POA: Diagnosis not present

## 2018-09-11 LAB — CBC WITH DIFFERENTIAL/PLATELET
Abs Immature Granulocytes: 0.02 10*3/uL (ref 0.00–0.07)
BASOS PCT: 1 %
Basophils Absolute: 0 10*3/uL (ref 0.0–0.1)
EOS ABS: 0.1 10*3/uL (ref 0.0–0.5)
Eosinophils Relative: 1 %
HCT: 39 % (ref 39.0–52.0)
Hemoglobin: 12.9 g/dL — ABNORMAL LOW (ref 13.0–17.0)
Immature Granulocytes: 0 %
Lymphocytes Relative: 17 %
Lymphs Abs: 0.9 10*3/uL (ref 0.7–4.0)
MCH: 29.9 pg (ref 26.0–34.0)
MCHC: 33.1 g/dL (ref 30.0–36.0)
MCV: 90.3 fL (ref 80.0–100.0)
MONO ABS: 0.9 10*3/uL (ref 0.1–1.0)
MONOS PCT: 16 %
NEUTROS ABS: 3.5 10*3/uL (ref 1.7–7.7)
Neutrophils Relative %: 65 %
Platelets: 185 10*3/uL (ref 150–400)
RBC: 4.32 MIL/uL (ref 4.22–5.81)
RDW: 15.6 % — AB (ref 11.5–15.5)
WBC: 5.3 10*3/uL (ref 4.0–10.5)
nRBC: 0 % (ref 0.0–0.2)

## 2018-09-11 NOTE — Discharge Summary (Addendum)
Physician Discharge Summary  [name]  FYB:[id number]  DOB: December 18, 1929  DOA: 09/08/2018 PCP: Colon Branch, MD  Admit date: 09/08/2018 Discharge date: 09/11/2018  Admitted From: Home  Disposition: Home   Recommendations for Outpatient Follow-up:  1. Follow up with PCP in 1 week  2. Please follow up on the following pending results:  Home Health: PT eval   Discharge Condition: stable  CODE STATUS: Full Code  Diet recommendation: Heart Healthy   Brief/Interim Summary: For full details see H&P/Progress note, but in brief, [name] is a 84 year old male with past medical history of chronic abdominal pain, chronic nausea, coronary artery disease, A. fib, hyperlipidemia and GERD presented to the emergency department for evaluation of worsening abdominal pain, nausea and unable to tolerate oral intake.  GI was consulted underwent EGD which showed 1 cm hiatal hernia, mild gastritis and normal duodenum.  Biopsies were taken.  Patient was clear for discharge, however on the day of the procedure patient had a low grade temperature.  Work-up for infectious process was negative which included UA, chest x-ray and CBC.  Patient was monitored overnight and temperature was normalized.  Patient was deemed stable for discharge and follow-up with PCP as an outpatient.  Subjective: Patient seen and examined, has no complaints this morning, tolerating diet well.  Had bowel movement this morning.  Afebrile overnight.  No acute events.  Denies cough, shortness of breath, chest pain, abdominal pain, nausea, diarrhea, dysuria and dizziness.  Discharge Diagnoses/Hospital Course:  Chronic abdominal pain from gastritis EGD reveals 1 cm hiatal hernia with mild gastritis.  Biopsy results pending.  Pain has been chronic with multiple images and work-up done without any significant abnormalities.  GI recommending to continue PPI and Carafate and follow-up as an outpatient.  Fever Felt to be reactive from  procedure.  No source of infection were identified.  UA was normal, chest x-ray and white count within normal limits.  Follow-up with PCP if fevers continue, this could be viral.  Paroxysmal A. fib Currently in sinus rhythm, on Eliquis at home, will discontinue aspirin as this can contribute to gastritis  GERD Continue PPI and Carafate  All other chronic medical condition were stable during the hospitalization.  On the day of the discharge the patient's vitals were stable, and no other acute medical condition were reported by patient. the patient was felt safe to be discharge to home.   Discharge Instructions  You were cared for by a hospitalist during your hospital stay. If you have any questions about your discharge medications or the care you received while you were in the hospital after you are discharged, you can call the unit and asked to speak with the hospitalist on call if the hospitalist that took care of you is not available. Once you are discharged, your primary care physician will handle any further medical issues. Please note that NO REFILLS for any discharge medications will be authorized once you are discharged, as it is imperative that you return to your primary care physician (or establish a relationship with a primary care physician if you do not have one) for your aftercare needs so that they can reassess your need for medications and monitor your lab values.  Discharge Instructions    Diet - low sodium heart healthy   Complete by:  As directed    Discharge instructions   Complete by:  As directed    1) Continue your home medications including pantoprazole and Carafate. 2)Follow up with  your PCP in a week. 3)You will be arranged a follow up by gastroenterology.   Increase activity slowly   Complete by:  As directed    Increase activity slowly   Complete by:  As directed      Allergies as of 09/11/2018      Reactions   Zoloft [sertraline Hcl]    Patient didn't like  the way it made him feel   Remeron [mirtazapine] Other (See Comments)   Excessive Drowsiness      Medication List    STOP taking these medications   aspirin 81 MG tablet   doxycycline 100 MG tablet Commonly known as:  VIBRA-TABS     TAKE these medications   cholecalciferol 1000 units tablet Commonly known as:  VITAMIN D Take 1,000 Units by mouth every morning.   denosumab 60 MG/ML Sosy injection Commonly known as:  PROLIA Inject 60 mg into the skin every 6 (six) months.   dronedarone 400 MG tablet Commonly known as:  MULTAQ TAKE ONE TABLET TWICE DAILY WITH A MEAL What changed:    how much to take  how to take this  when to take this  additional instructions   ELIQUIS 5 MG Tabs tablet Generic drug:  apixaban TAKE ONE TABLET BY MOUTH TWICE DAILY What changed:  how much to take   folic acid 1 MG tablet Commonly known as:  FOLVITE Take 1 mg by mouth daily.   loratadine 10 MG tablet Commonly known as:  CLARITIN Take 10 mg by mouth daily.   multivitamin with minerals Tabs tablet Take 1 tablet by mouth daily.   nitroGLYCERIN 0.4 MG SL tablet Commonly known as:  NITROSTAT Place 1 tablet (0.4 mg total) under the tongue every 5 (five) minutes x 3 doses as needed for chest pain.   ondansetron 4 MG disintegrating tablet Commonly known as:  ZOFRAN-ODT Take 1 tablet (4 mg total) by mouth every 8 (eight) hours as needed for nausea or vomiting.   pantoprazole 40 MG tablet Commonly known as:  PROTONIX Take 1 tablet (40 mg total) by mouth daily.   rosuvastatin 10 MG tablet Commonly known as:  CRESTOR Take 10 mg by mouth daily.   sucralfate 1 GM/10ML suspension Commonly known as:  CARAFATE Take 10 mLs (1 g total) by mouth 4 (four) times daily -  with meals and at bedtime.   TUMS PO Take 1 tablet by mouth 2 (two) times daily as needed (heartburn). For calcium      Follow-up Information    Colon Branch, MD. Schedule an appointment as soon as possible for a  visit in 1 week(s).   Specialty:  Internal Medicine Contact information: [address] [place] [address] [phone]          Allergies  Allergen Reactions  . Zoloft [Sertraline Hcl]     Patient didn't like the way it made him feel  . Remeron [Mirtazapine] Other (See Comments)    Excessive Drowsiness    Consultations:  GI   Procedures/Studies: Dg Chest 2 View  Result Date: 09/10/2018 CLINICAL DATA:  Fever. EXAM: CHEST - 2 VIEW COMPARISON:  Chest x-ray and CT chest dated August 16, 2016. FINDINGS: The heart size and mediastinal contours are within normal limits. Normal pulmonary vascularity. The lungs remain hyperinflated with diffusely coarsened interstitial markings. Small nodular density in the left upper lobe is unchanged. No focal consolidation, pleural effusion, or pneumothorax. No acute osseous abnormality. Unchanged chronic T8 and T12  compression fractures. IMPRESSION: 1.  No active cardiopulmonary disease.  COPD. 2. Unchanged small nodular density in the left upper lobe. Follow-up recommendations from prior CT chest dated 08/16/2018 remain. Electronically Signed   By: Titus Dubin M.D.   On: 09/10/2018 17:24   Dg Chest 2 View  Result Date: 08/16/2018 CLINICAL DATA:  Left-sided chest pain. EXAM: CHEST - 2 VIEW COMPARISON:  06/30/2016 FINDINGS: Lungs are hyperexpanded. Interstitial markings are diffusely coarsened with chronic features. Small nodular density in the left lung, in a region of architectural distortion, is new in the interval. No pulmonary edema or pleural effusion. Stable midthoracic compression fracture. Telemetry leads overlie the chest. IMPRESSION: 1. Emphysema without acute cardiopulmonary findings. 2. Small nodule left upper lobe. This was not visible on the prior study. CT chest without contrast could be used to further evaluate as clinically warranted. Electronically Signed   By: Misty Stanley M.D.   On: 08/16/2018 16:20   Ct  Chest W Contrast  Result Date: 08/16/2018 CLINICAL DATA:  LEFT chest pain beginning this morning, nausea and vomiting. History of reflux disease. EXAM: CT CHEST, ABDOMEN, AND PELVIS WITH CONTRAST TECHNIQUE: Multidetector CT imaging of the chest, abdomen and pelvis was performed following the standard protocol during bolus administration of intravenous contrast. CONTRAST:  147mL OMNIPAQUE IOHEXOL 300 MG/ML  SOLN COMPARISON:  Chest radiograph August 16, 2018 and abdominal ultrasound June 08, 2018 FINDINGS: CT CHEST FINDINGS CARDIOVASCULAR: Heart size is normal. Severe coronary artery calcifications. Status post CABG. No pericardial effusion. Admixture of contrast RIGHT atrium. Mildly ectatic 3.4 cm ascending aorta. Mild calcific atherosclerosis. MEDIASTINUM/NODES: No mediastinal mass. No lymphadenopathy by CT size criteria. Normal appearance of thoracic esophagus though not tailored for evaluation. LUNGS/PLEURA: Tracheobronchial tree is patent, no pneumothorax. Biapical reticulonodular scarring. Dependent atelectasis. LEFT upper lobe scarring with subpleural nodularity in bronchiectasis. 9 mm LEFT upper lobe anterior segment subpleural consolidation (series 5, image 44). No pleural effusion. MUSCULOSKELETAL: Osteopenia. Moderate T8, mild T9, mild T10, moderate T12 compression fractures. Status post median sternotomy. CT ABDOMEN AND PELVIS FINDINGS HEPATOBILIARY: Calcified hepatic granuloma, 1 cm flash filling hemangioma LEFT lobe of the liver. Normal gallbladder. PANCREAS: 1 cm cyst or lymphangioma, otherwise unremarkable. SPLEEN: Normal. ADRENALS/URINARY TRACT: Kidneys are orthotopic, demonstrating symmetric enhancement. No nephrolithiasis, hydronephrosis or solid renal masses. The unopacified ureters are normal in course and caliber. Delayed imaging through the kidneys demonstrates symmetric prompt contrast excretion within the proximal urinary collecting system. Urinary bladder is partially distended and  unremarkable. Normal adrenal glands. STOMACH/BOWEL: Stomach, small and large bowel are normal in course and caliber without inflammatory changes, sensitivity decreased without oral contrast. Moderate sigmoid colonic diverticulosis. Normal appendix. VASCULAR/LYMPHATIC: Aortoiliac vessels are normal in course and caliber. Mild intimal thickening calcific atherosclerosis. No lymphadenopathy by CT size criteria. REPRODUCTIVE: Mild prostatomegaly. OTHER: No intraperitoneal free fluid or free air. MUSCULOSKELETAL: Osteopenia. Moderate to severe L1, moderate to severe L2, mild L3, mild L4 and mild-to-moderate L5 compression fractures. RIGHT femur ORIF. Severe lower lumbar facet arthropathy. IMPRESSION: CT CHEST: 1. 9 mm LEFT upper lobe subpleural nodular consolidation seen with pneumonia or, may be, chronic. Biapical and LEFT lung scarring. Consider 3 to six-month follow-up chest CT to verify stability of findings. 2. Osteopenia, multilevel age indeterminate compression fractures, moderate at T8 and T12. CT ABDOMEN AND PELVIS: 1. No acute intra-abdominopelvic process. 2. colonic diverticulosis. 3. Osteopenia, age indeterminate compression fractures all lumbar levels, moderate to severe at L1 and L2. Aortic Atherosclerosis (ICD10-I70.0). Electronically Signed   By: Thana Farr.D.  On: 08/16/2018 18:57   Ct Abdomen Pelvis W Contrast  Result Date: 08/16/2018 CLINICAL DATA:  LEFT chest pain beginning this morning, nausea and vomiting. History of reflux disease. EXAM: CT CHEST, ABDOMEN, AND PELVIS WITH CONTRAST TECHNIQUE: Multidetector CT imaging of the chest, abdomen and pelvis was performed following the standard protocol during bolus administration of intravenous contrast. CONTRAST:  142mL OMNIPAQUE IOHEXOL 300 MG/ML  SOLN COMPARISON:  Chest radiograph August 16, 2018 and abdominal ultrasound June 08, 2018 FINDINGS: CT CHEST FINDINGS CARDIOVASCULAR: Heart size is normal. Severe coronary artery calcifications.  Status post CABG. No pericardial effusion. Admixture of contrast RIGHT atrium. Mildly ectatic 3.4 cm ascending aorta. Mild calcific atherosclerosis. MEDIASTINUM/NODES: No mediastinal mass. No lymphadenopathy by CT size criteria. Normal appearance of thoracic esophagus though not tailored for evaluation. LUNGS/PLEURA: Tracheobronchial tree is patent, no pneumothorax. Biapical reticulonodular scarring. Dependent atelectasis. LEFT upper lobe scarring with subpleural nodularity in bronchiectasis. 9 mm LEFT upper lobe anterior segment subpleural consolidation (series 5, image 44). No pleural effusion. MUSCULOSKELETAL: Osteopenia. Moderate T8, mild T9, mild T10, moderate T12 compression fractures. Status post median sternotomy. CT ABDOMEN AND PELVIS FINDINGS HEPATOBILIARY: Calcified hepatic granuloma, 1 cm flash filling hemangioma LEFT lobe of the liver. Normal gallbladder. PANCREAS: 1 cm cyst or lymphangioma, otherwise unremarkable. SPLEEN: Normal. ADRENALS/URINARY TRACT: Kidneys are orthotopic, demonstrating symmetric enhancement. No nephrolithiasis, hydronephrosis or solid renal masses. The unopacified ureters are normal in course and caliber. Delayed imaging through the kidneys demonstrates symmetric prompt contrast excretion within the proximal urinary collecting system. Urinary bladder is partially distended and unremarkable. Normal adrenal glands. STOMACH/BOWEL: Stomach, small and large bowel are normal in course and caliber without inflammatory changes, sensitivity decreased without oral contrast. Moderate sigmoid colonic diverticulosis. Normal appendix. VASCULAR/LYMPHATIC: Aortoiliac vessels are normal in course and caliber. Mild intimal thickening calcific atherosclerosis. No lymphadenopathy by CT size criteria. REPRODUCTIVE: Mild prostatomegaly. OTHER: No intraperitoneal free fluid or free air. MUSCULOSKELETAL: Osteopenia. Moderate to severe L1, moderate to severe L2, mild L3, mild L4 and mild-to-moderate L5  compression fractures. RIGHT femur ORIF. Severe lower lumbar facet arthropathy. IMPRESSION: CT CHEST: 1. 9 mm LEFT upper lobe subpleural nodular consolidation seen with pneumonia or, may be, chronic. Biapical and LEFT lung scarring. Consider 3 to six-month follow-up chest CT to verify stability of findings. 2. Osteopenia, multilevel age indeterminate compression fractures, moderate at T8 and T12. CT ABDOMEN AND PELVIS: 1. No acute intra-abdominopelvic process. 2. colonic diverticulosis. 3. Osteopenia, age indeterminate compression fractures all lumbar levels, moderate to severe at L1 and L2. Aortic Atherosclerosis (ICD10-I70.0). Electronically Signed   By: Elon Alas M.D.   On: 08/16/2018 18:57   US Abdomen Limited Ruq  Result Date: 09/08/2018 CLINICAL DATA:  Right upper quadrant pain for 2 months EXAM: ULTRASOUND ABDOMEN LIMITED RIGHT UPPER QUADRANT COMPARISON:  None. FINDINGS: Gallbladder: No gallstones or wall thickening visualized. No sonographic Murphy sign noted by sonographer. Common bile duct: Diameter: 2.5 mm Liver: Echogenic liver lesion in the left hepatic lobe measuring 1.3 x 1 x 1.1 cm consistent with a small hemangioma. Portal vein is patent on color Doppler imaging with normal direction of blood flow towards the liver. IMPRESSION: Unremarkable appearance of the gallbladder. Echogenic liver lesion measuring 1.1 x 1 x 1.1 cm compatible with a hemangioma. Electronically Signed   By: Ashley Royalty M.D.   On: 09/08/2018 22:09    Discharge Exam: Vitals:   09/11/18 0509 09/11/18 0840  BP: 103/82   Pulse: 90   Resp: 14   Temp: 99.8 F (37.7  C) 98.2 F (36.8 C)  SpO2: 95%    Vitals:   09/10/18 2106 09/11/18 0400 09/11/18 0509 09/11/18 0840  BP: (!) 109/58  103/82   Pulse: 71  90   Resp: 16  14   Temp: 99.8 F (37.7 C) 99.9 F (37.7 C) 99.8 F (37.7 C) 98.2 F (36.8 C)  TempSrc: Oral Oral Oral   SpO2: 96%  95%   Weight:      Height:        General: Pt is alert, awake, not  in acute distress Cardiovascular: RRR, S1/S2 +, no rubs, no gallops Respiratory: CTA bilaterally, no wheezing, no rhonchi Abdominal: Soft, NT, ND, bowel sounds + Extremities: no edema, no cyanosis  The results of significant diagnostics from this hospitalization (including imaging, microbiology, ancillary and laboratory) are listed below for reference.     Microbiology: No results found for this or any previous visit (from the past 240 hour(s)).   Labs: BNP (last 3 results) No results for input(s): BNP in the last 8760 hours. Basic Metabolic Panel: Recent Labs  Lab 09/08/18 1833 09/09/18 0434  NA 135 136  K 3.8 4.4  CL 95* 100  CO2 26 25  GLUCOSE 97 110*  BUN 11 10  CREATININE 0.88 0.83  CALCIUM 9.2 8.8*   Liver Function Tests: Recent Labs  Lab 09/08/18 1833 09/09/18 0434  AST 28 22  ALT 19 15  ALKPHOS 41 39  BILITOT 0.6 0.3  PROT 7.0 6.4*  ALBUMIN 3.8 3.5   Recent Labs  Lab 09/08/18 1833 09/09/18 0434  LIPASE 37 35   No results for input(s): AMMONIA in the last 168 hours. CBC: Recent Labs  Lab 09/08/18 1833 09/09/18 0434 09/11/18 0442  WBC 6.6 5.0 5.3  NEUTROABS  --   --  3.5  HGB 13.2 13.1 12.9*  HCT 40.7 40.4 39.0  MCV 92.9 91.6 90.3  PLT 180 169 185   Cardiac Enzymes: No results for input(s): CKTOTAL, CKMB, CKMBINDEX, TROPONINI in the last 168 hours. BNP: Invalid input(s): POCBNP CBG: No results for input(s): GLUCAP in the last 168 hours. D-Dimer No results for input(s): DDIMER in the last 72 hours. Hgb A1c No results for input(s): HGBA1C in the last 72 hours. Lipid Profile No results for input(s): CHOL, HDL, LDLCALC, TRIG, CHOLHDL, LDLDIRECT in the last 72 hours. Thyroid function studies No results for input(s): TSH, T4TOTAL, T3FREE, THYROIDAB in the last 72 hours.  Invalid input(s): FREET3 Anemia work up No results for input(s): VITAMINB12, FOLATE, FERRITIN, TIBC, IRON, RETICCTPCT in the last 72 hours. Urinalysis    Component  Value Date/Time   COLORURINE YELLOW 09/08/2018 1833   APPEARANCEUR CLEAR 09/08/2018 1833   LABSPEC 1.011 09/08/2018 1833   PHURINE 6.0 09/08/2018 1833   GLUCOSEU NEGATIVE 09/08/2018 1833   GLUCOSEU NEGATIVE 01/14/2016 1020   HGBUR MODERATE (A) 09/08/2018 1833   HGBUR negative 01/10/2011 1435   BILIRUBINUR NEGATIVE 09/08/2018 1833   BILIRUBINUR neg 08/08/2013 0916   KETONESUR 80 (A) 09/08/2018 1833   PROTEINUR NEGATIVE 09/08/2018 1833   UROBILINOGEN 0.2 01/14/2016 1020   NITRITE NEGATIVE 09/08/2018 1833   LEUKOCYTESUR NEGATIVE 09/08/2018 1833   Sepsis Labs Invalid input(s): PROCALCITONIN,  WBC,  LACTICIDVEN Microbiology No results found for this or any previous visit (from the past 240 hour(s)).   Time coordinating discharge: 32 minutes  SIGNED:  Chipper Oman, MD  [facility] 09/11/2018, 12:08 PM  Pager please text page via  [email or web address]  Note - This record has been  created using [employer]. Chart creation errors have been sought, but may not always have been located. Such creation errors do not reflect on the standard of medical care.

## 2018-09-11 NOTE — Progress Notes (Signed)
Consult- Return to Independent/ Rehab at [facility].  Patient is a resident at [facility]. Patient reports he prefers to go to rehab for a couple of days at [facility] before transitioning back to his Independent living.  CSW contact Koleen Distance the admission coordinator at the rehab. She reports the patient can use his "rehab grace days" for room and board and will have to pay privately for the therapy. Patient is agreeable.  Patient report he has arranged for a friend to transport.   Kathrin Greathouse, Marlinda Mike, MSW Clinical Social Worker  [phone] 09/11/2018  12:59 PM

## 2018-09-11 NOTE — Anesthesia Postprocedure Evaluation (Signed)
Anesthesia Post Note  Patient: NIDAL RIVET  Procedure(s) Performed: ESOPHAGOGASTRODUODENOSCOPY (EGD) WITH PROPOFOL (N/A ) BIOPSY     Patient location during evaluation: Endoscopy Anesthesia Type: General Level of consciousness: awake and alert Pain management: pain level controlled Vital Signs Assessment: post-procedure vital signs reviewed and stable Respiratory status: spontaneous breathing, nonlabored ventilation, respiratory function stable and patient connected to nasal cannula oxygen Cardiovascular status: blood pressure returned to baseline and stable Postop Assessment: no apparent nausea or vomiting Anesthetic complications: no    Last Vitals:  Vitals:   09/11/18 0509 09/11/18 0840  BP: 103/82   Pulse: 90   Resp: 14   Temp: 37.7 C 36.8 C  SpO2: 95%     Last Pain:  Vitals:   09/11/18 0509  TempSrc: Oral  PainSc:                  Austen Oyster L Manus Weedman

## 2018-09-11 NOTE — Anesthesia Preprocedure Evaluation (Signed)
Anesthesia Evaluation  Patient identified by MRN, date of birth, ID band Patient awake    Reviewed: Allergy & Precautions, NPO status , Patient's Chart, lab work & pertinent test results  History of Anesthesia Complications (+) PONV  Airway Mallampati: II  TM Distance: >3 FB Neck ROM: Full    Dental no notable dental hx. (+) Dental Advisory Given   Pulmonary COPD, former smoker,    Pulmonary exam normal breath sounds clear to auscultation       Cardiovascular + CAD and + CABG  Normal cardiovascular exam+ dysrhythmias Atrial Fibrillation  Rhythm:Regular Rate:Normal     Neuro/Psych  Headaches, Anxiety Depression    GI/Hepatic Neg liver ROS, GERD  Medicated,  Endo/Other  negative endocrine ROS  Renal/GU negative Renal ROS  negative genitourinary   Musculoskeletal negative musculoskeletal ROS (+)   Abdominal   Peds  Hematology negative hematology ROS (+)   Anesthesia Other Findings On eliquis   epigastric pain  Reproductive/Obstetrics                             Anesthesia Physical Anesthesia Plan  ASA: II  Anesthesia Plan: MAC   Post-op Pain Management:    Induction: Intravenous  PONV Risk Score and Plan: 2 and Treatment may vary due to age or medical condition and Propofol infusion  Airway Management Planned: Natural Airway and Simple Face Mask  Additional Equipment:   Intra-op Plan:   Post-operative Plan:   Informed Consent: I have reviewed the patients History and Physical, chart, labs and discussed the procedure including the risks, benefits and alternatives for the proposed anesthesia with the patient or authorized representative who has indicated his/her understanding and acceptance.   Dental advisory given  Plan Discussed with: CRNA  Anesthesia Plan Comments:         Anesthesia Quick Evaluation

## 2018-09-11 NOTE — NC FL2 (Addendum)
[place] LEVEL OF CARE SCREENING TOOL     IDENTIFICATION  Patient Name: [name] Birthdate: 1929/12/24 Sex: male Admission Date (Current Location): 09/08/2018  [facility] and [place] Number:  Herbalist and Address:  [facility],  [place], [place]      Provider Number: [id number]  Attending Physician Name and Address:  Patrecia Pour, Christean Grief, MD  Relative Name and Phone Number:       Current Level of Care: Hospital Recommended Level of Care: [place] Prior Approval Number:    Date Approved/Denied:   PASRR Number: [id number]  Discharge Plan: SNF    Current Diagnoses: Patient Active Problem List   Diagnosis Date Noted  . Abdominal pain, epigastric   . Abdominal pain 09/08/2018  . Anxiety and depression 06/22/2016  . COPD (chronic obstructive pulmonary disease) ([place]) 03/25/2016  . Annual physical exam 03/24/2016  . PCP NOTES >>>>>>>>>>>>>> 08/27/2015  . Allergic rhinitis 03/09/2015  . Headache 02/23/2015  . Bradycardia 06/15/2014  . Arthritis, degenerative 06/15/2014  . Hyperglycemia 01/08/2014  . Paroxysmal A-fib ([place]) 12/28/2013  . Diverticulosis of colon without hemorrhage 10/13/2013  . Mixed hyperlipidemia 04/18/2010  . Coronary atherosclerosis 02/25/2010  . PREMATURE VENTRICULAR CONTRACTIONS 02/08/2010  . HIP FRACTURE, RIGHT 12/13/2009  . IBS 09/01/2009  . Weakness 06/28/2009  . LUMBAR RADICULOPATHY, RIGHT 06/09/2009  . Esophageal reflux 09/22/2008  . PALPITATIONS 05/11/2008  . HYPERPLASIA, PROSTATE NOS W/URINARY OBST/LUTS 09/20/2007  . Closed fracture of dorsal (thoracic) vertebra without mention of spinal cord injury 09/20/2007  . Osteoporosis 01/01/2007    Orientation RESPIRATION BLADDER Height & Weight     Self, Time, Situation, Place  Normal Continent Weight: 144 lb 2.9 oz (65.4 kg) Height:  6\' 2"  (188 cm)  BEHAVIORAL SYMPTOMS/MOOD NEUROLOGICAL BOWEL NUTRITION STATUS   Continent Diet(Low sodium- Heart Healthy)  AMBULATORY STATUS COMMUNICATION OF NEEDS Skin   Independent Verbally Normal                       Personal Care Assistance Level of Assistance    Bathing Assistance: Independent Feeding assistance: Independent Dressing Assistance: Independent     Functional Limitations Info             SPECIAL CARE FACTORS FREQUENCY   Physical Therapy  5x                  Contractures      Additional Factors Info  Code Status, Allergies Code Status Info: Full Allergies Info: Zoloft, Remeron           Current Medications (09/11/2018):  This is the current hospital active medication list Current Facility-Administered Medications  Medication Dose Route Frequency Provider Last Rate Last Dose  . acetaminophen (TYLENOL) tablet 650 mg  650 mg Oral Q6H PRN Schorr, Rhetta Mura, NP   650 mg at 09/10/18 0008  . calcium carbonate (TUMS - dosed in mg elemental calcium) chewable tablet 200 mg of elemental calcium  1 tablet Oral BID PRN Elwyn Reach, MD      . cholecalciferol (VITAMIN D) tablet 1,000 Units  1,000 Units Oral Daily Elwyn Reach, MD   1,000 Units at 09/11/18 1204  . dronedarone (MULTAQ) tablet 400 mg  400 mg Oral BID WC Gala Romney L, MD   400 mg at 09/11/18 0839  . enoxaparin (LOVENOX) injection 40 mg  40 mg Subcutaneous Q24H Shelly Coss, MD   40 mg at 09/10/18 2106  .  folic acid (FOLVITE) tablet 1 mg  1 mg Oral Daily Elwyn Reach, MD   1 mg at 09/11/18 1205  . loratadine (CLARITIN) tablet 10 mg  10 mg Oral Daily Gala Romney L, MD      . multivitamin with minerals tablet 1 tablet  1 tablet Oral Daily Elwyn Reach, MD   1 tablet at 09/11/18 1205  . nitroGLYCERIN (NITROSTAT) SL tablet 0.4 mg  0.4 mg Sublingual Q5 Min x 3 PRN Gala Romney L, MD      . ondansetron (ZOFRAN) tablet 4 mg  4 mg Oral Q6H PRN Elwyn Reach, MD       Or  . ondansetron (ZOFRAN) injection 4 mg  4 mg Intravenous Q6H PRN  Elwyn Reach, MD   4 mg at 09/09/18 0827  . pantoprazole (PROTONIX) EC tablet 40 mg  40 mg Oral QAC breakfast Pyrtle, Lajuan Lines, MD   40 mg at 09/11/18 0839  . rosuvastatin (CRESTOR) tablet 10 mg  10 mg Oral Daily Elwyn Reach, MD   10 mg at 09/11/18 1205  . sucralfate (CARAFATE) 1 GM/10ML suspension 1 g  1 g Oral TID WC & HS Elwyn Reach, MD   1 g at 09/11/18 1205     Discharge Medications: Please see discharge summary for a list of discharge medications.  Relevant Imaging Results:  Relevant Lab Results:   Additional Information SSN: [id number]  Ollen Barges, LCSWA

## 2018-09-12 ENCOUNTER — Telehealth: Payer: Self-pay

## 2018-09-12 DIAGNOSIS — I2510 Atherosclerotic heart disease of native coronary artery without angina pectoris: Secondary | ICD-10-CM | POA: Diagnosis not present

## 2018-09-12 DIAGNOSIS — K2970 Gastritis, unspecified, without bleeding: Secondary | ICD-10-CM | POA: Diagnosis not present

## 2018-09-12 DIAGNOSIS — I4891 Unspecified atrial fibrillation: Secondary | ICD-10-CM | POA: Diagnosis not present

## 2018-09-12 DIAGNOSIS — R2681 Unsteadiness on feet: Secondary | ICD-10-CM | POA: Diagnosis not present

## 2018-09-12 DIAGNOSIS — M6281 Muscle weakness (generalized): Secondary | ICD-10-CM | POA: Diagnosis not present

## 2018-09-12 DIAGNOSIS — I10 Essential (primary) hypertension: Secondary | ICD-10-CM | POA: Diagnosis not present

## 2018-09-12 DIAGNOSIS — K219 Gastro-esophageal reflux disease without esophagitis: Secondary | ICD-10-CM | POA: Diagnosis not present

## 2018-09-12 DIAGNOSIS — K5790 Diverticulosis of intestine, part unspecified, without perforation or abscess without bleeding: Secondary | ICD-10-CM | POA: Diagnosis not present

## 2018-09-12 DIAGNOSIS — K449 Diaphragmatic hernia without obstruction or gangrene: Secondary | ICD-10-CM | POA: Diagnosis not present

## 2018-09-12 NOTE — Telephone Encounter (Signed)
TCM hospital follow up call made to patient. Unable to leave message.

## 2018-09-13 ENCOUNTER — Other Ambulatory Visit: Payer: Self-pay | Admitting: Internal Medicine

## 2018-09-13 DIAGNOSIS — R2681 Unsteadiness on feet: Secondary | ICD-10-CM | POA: Diagnosis not present

## 2018-09-13 DIAGNOSIS — K5790 Diverticulosis of intestine, part unspecified, without perforation or abscess without bleeding: Secondary | ICD-10-CM | POA: Diagnosis not present

## 2018-09-13 DIAGNOSIS — K449 Diaphragmatic hernia without obstruction or gangrene: Secondary | ICD-10-CM | POA: Diagnosis not present

## 2018-09-13 DIAGNOSIS — M6281 Muscle weakness (generalized): Secondary | ICD-10-CM | POA: Diagnosis not present

## 2018-09-13 DIAGNOSIS — K2970 Gastritis, unspecified, without bleeding: Secondary | ICD-10-CM | POA: Diagnosis not present

## 2018-09-13 DIAGNOSIS — K219 Gastro-esophageal reflux disease without esophagitis: Secondary | ICD-10-CM | POA: Diagnosis not present

## 2018-09-16 ENCOUNTER — Telehealth: Payer: Self-pay

## 2018-09-16 DIAGNOSIS — K2970 Gastritis, unspecified, without bleeding: Secondary | ICD-10-CM | POA: Diagnosis not present

## 2018-09-16 DIAGNOSIS — M6281 Muscle weakness (generalized): Secondary | ICD-10-CM | POA: Diagnosis not present

## 2018-09-16 DIAGNOSIS — K5790 Diverticulosis of intestine, part unspecified, without perforation or abscess without bleeding: Secondary | ICD-10-CM | POA: Diagnosis not present

## 2018-09-16 DIAGNOSIS — R2681 Unsteadiness on feet: Secondary | ICD-10-CM | POA: Diagnosis not present

## 2018-09-16 DIAGNOSIS — K219 Gastro-esophageal reflux disease without esophagitis: Secondary | ICD-10-CM | POA: Diagnosis not present

## 2018-09-16 DIAGNOSIS — K449 Diaphragmatic hernia without obstruction or gangrene: Secondary | ICD-10-CM | POA: Diagnosis not present

## 2018-09-16 NOTE — Telephone Encounter (Signed)
Called patient again to schedule hospital follow up appointment. Left another message for return call.

## 2018-09-17 DIAGNOSIS — I4891 Unspecified atrial fibrillation: Secondary | ICD-10-CM | POA: Diagnosis not present

## 2018-09-17 DIAGNOSIS — I2510 Atherosclerotic heart disease of native coronary artery without angina pectoris: Secondary | ICD-10-CM | POA: Diagnosis not present

## 2018-09-17 DIAGNOSIS — K2970 Gastritis, unspecified, without bleeding: Secondary | ICD-10-CM | POA: Diagnosis not present

## 2018-09-17 DIAGNOSIS — K5790 Diverticulosis of intestine, part unspecified, without perforation or abscess without bleeding: Secondary | ICD-10-CM | POA: Diagnosis not present

## 2018-09-17 DIAGNOSIS — K219 Gastro-esophageal reflux disease without esophagitis: Secondary | ICD-10-CM | POA: Diagnosis not present

## 2018-09-17 DIAGNOSIS — I10 Essential (primary) hypertension: Secondary | ICD-10-CM | POA: Diagnosis not present

## 2018-09-17 DIAGNOSIS — M6281 Muscle weakness (generalized): Secondary | ICD-10-CM | POA: Diagnosis not present

## 2018-09-17 DIAGNOSIS — R2681 Unsteadiness on feet: Secondary | ICD-10-CM | POA: Diagnosis not present

## 2018-09-17 DIAGNOSIS — K449 Diaphragmatic hernia without obstruction or gangrene: Secondary | ICD-10-CM | POA: Diagnosis not present

## 2018-09-18 ENCOUNTER — Encounter: Payer: Medicare Other | Admitting: Gastroenterology

## 2018-09-18 NOTE — Telephone Encounter (Signed)
Called patient to do TCM. Appointment has been scheduled for hospital follow up. Unable to reach patient for review questions.

## 2018-09-20 ENCOUNTER — Encounter: Payer: Self-pay | Admitting: Internal Medicine

## 2018-09-20 ENCOUNTER — Ambulatory Visit (INDEPENDENT_AMBULATORY_CARE_PROVIDER_SITE_OTHER): Payer: Medicare Other | Admitting: Internal Medicine

## 2018-09-20 ENCOUNTER — Ambulatory Visit (HOSPITAL_BASED_OUTPATIENT_CLINIC_OR_DEPARTMENT_OTHER)
Admission: RE | Admit: 2018-09-20 | Discharge: 2018-09-20 | Disposition: A | Discharge status: Home / Self Care | Payer: Medicare Other | Source: Ambulatory Visit | Attending: Family Medicine | Admitting: Family Medicine

## 2018-09-20 VITALS — BP 134/78 | HR 69 | Temp 97.9°F | Resp 16 | Ht 74.0 in | Wt 145.5 lb

## 2018-09-20 DIAGNOSIS — R509 Fever, unspecified: Secondary | ICD-10-CM | POA: Insufficient documentation

## 2018-09-20 DIAGNOSIS — R61 Generalized hyperhidrosis: Secondary | ICD-10-CM | POA: Insufficient documentation

## 2018-09-20 DIAGNOSIS — J984 Other disorders of lung: Secondary | ICD-10-CM | POA: Diagnosis not present

## 2018-09-20 DIAGNOSIS — Z951 Presence of aortocoronary bypass graft: Secondary | ICD-10-CM | POA: Insufficient documentation

## 2018-09-20 DIAGNOSIS — R110 Nausea: Secondary | ICD-10-CM | POA: Diagnosis not present

## 2018-09-20 DIAGNOSIS — I700 Atherosclerosis of aorta: Secondary | ICD-10-CM | POA: Insufficient documentation

## 2018-09-20 LAB — URINALYSIS
Bilirubin Urine: NEGATIVE
Hgb urine dipstick: NEGATIVE
KETONES UR: NEGATIVE
Leukocytes, UA: NEGATIVE
Nitrite: NEGATIVE
PH: 5.5 (ref 5.0–8.0)
TOTAL PROTEIN, URINE-UPE24: NEGATIVE
Urine Glucose: NEGATIVE
Urobilinogen, UA: 0.2 (ref 0.0–1.0)

## 2018-09-20 LAB — CBC
HEMATOCRIT: 39.5 % (ref 39.0–52.0)
HEMOGLOBIN: 13.1 g/dL (ref 13.0–17.0)
MCHC: 33.1 g/dL (ref 30.0–36.0)
MCV: 91.6 fl (ref 78.0–100.0)
PLATELETS: 341 10*3/uL (ref 150.0–400.0)
RBC: 4.31 Mil/uL (ref 4.22–5.81)
RDW: 16.5 % — ABNORMAL HIGH (ref 11.5–15.5)
WBC: 10.3 10*3/uL (ref 4.0–10.5)

## 2018-09-20 NOTE — Progress Notes (Signed)
Subjective:    Patient ID: [name], male    DOB: 04/19/1930, 84 y.o.   MRN: [id number]  DOS:  09/20/2018 Type of visit - description : TCM 14 Interval history: Admitted to the hospital 09/08/2018, discharge 3 days later.  He went to the ER for evaluation of worsening abdominal pain, nausea poor p.o. intake.  GI consulted, had a EGD, had a 1 cm HH with mild gastritis.  Biopsies showed no H. pylori. GI recommended continue PPIs and Carafate.  he had fever, no source found.  Most recent labs include a potassium of 4.4, creatinine 0.8, hemoglobin is stable and 12.9,  Review of Systems Since he left the hospital, he does have some concerns: The left side of the jaw seems unchanged sometimes, particularly if he open his mouth widely.  Overall that is getting better He had some fever at the hospital and after the discharge he has been experienced night sweats, every night, last night he had to change his pajamas twice. Denies fevers during the daytime He has some sinus and chest congestion in the mornings, usually all that clears after he cough and has no further problems. No chest pain, difficulty breathing or wheezing. She had a number of GI problems including nausea vomiting, decreased appetite.  All that is improving. No LUTS.  Past Medical History:  Diagnosis Date  . Anxiety   . Atrial fibrillation ([place])   . Blood transfusion without reported diagnosis   . CAD    a. 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  . Cataract   . Coronary artery disease   . Diverticulosis 2011   Diverticulitis 2004  . Esophageal reflux   . Headache    saw neuro 4-16, MRI done  . HYPERPLASIA, PROSTATE NOS W/URINARY OBST/LUTS   . IBS   . INGUINAL HERNIA   . Lichen planus [id number]   [name]  . LUMBAR RADICULOPATHY, RIGHT   . Mixed hyperlipidemia   . OSTEOPOROSIS   . PONV (postoperative nausea and vomiting)   .  PREMATURE VENTRICULAR CONTRACTIONS    a. After CABG - was on Coumadin/Multaq for a period of time. Coumadin discontinued 04/2010 after maintaining NSR.  [name] UNSPECIFIED ANEMIA     Past Surgical History:  Procedure Laterality Date  . BIOPSY  09/10/2018   Procedure: BIOPSY;  Surgeon: Jerene Bears, MD;  Location: [facility];  Service: Gastroenterology;;  . CATARACT EXTRACTION Right 10/06/2013  . CORONARY ARTERY BYPASS GRAFT     2 VD;anomalous RCA;post op compliacted by AF  . ESOPHAGOGASTRODUODENOSCOPY (EGD) WITH PROPOFOL N/A 09/10/2018   Procedure: ESOPHAGOGASTRODUODENOSCOPY (EGD) WITH PROPOFOL;  Surgeon: Jerene Bears, MD;  Location: [facility];  Service: Gastroenterology;  Laterality: N/A;  . HIP FRACTURE SURGERY Right 03/2007   Dr. Salvadore Farber  . INGUINAL HERNIA REPAIR Left 2012   Dr Brantley Stage  . KNEE SURGERY Right 1977   repair  . PROSTATE BIOPSY  09/19/2013   [facility]    Social History   Socioeconomic History  . Marital status: Widowed    Spouse name: Not on file  . Number of children: 0  . Years of education: Not on file  . Highest education level: Not on file  Occupational History  . Occupation: retired    Fish farm manager: RETIRED  Social Needs  . Financial resource strain: Not on file  . Food insecurity:    Worry: Not on  file    Inability: Not on file  . Transportation needs:    Medical: Not on file    Non-medical: Not on file  Tobacco Use  . Smoking status: Former Smoker    Types: Cigarettes    Last attempt to quit: 11/27/1962    Years since quitting: 55.8  . Smokeless tobacco: Never Used  Substance and Sexual Activity  . Alcohol use: Not Currently    Alcohol/week: 0.0 standard drinks    Comment: quit drinking   . Drug use: No  . Sexual activity: Not Currently  Lifestyle  . Physical activity:    Days per week: Not on file    Minutes per session: Not on file  . Stress: Not on file  Relationships  . Social connections:    Talks on phone: Not on file     Gets together: Not on file    Attends religious service: Not on file    Active member of club or organization: Not on file    Attends meetings of clubs or organizations: Not on file    Relationship status: Not on file  . Intimate partner violence:    Fear of current or ex partner: Not on file    Emotionally abused: Not on file    Physically abused: Not on file    Forced sexual activity: Not on file  Other Topics Concern  . Not on file  Social History Narrative   Lives at [facility] in independent living.     Lost wife 06-2016.     Has no children.  Family: nephews-nices in [place]   Retired from [employer].     Still drives.       Allergies as of 09/20/2018      Reactions   Zoloft [sertraline Hcl]    Patient didn't like the way it made him feel   Remeron [mirtazapine] Other (See Comments)   Excessive Drowsiness      Medication List        Accurate as of 09/20/18 11:59 PM. Always use your most recent med list.          CARAFATE 1 GM/10ML suspension Generic drug:  sucralfate TAKE 10ML BY MOUTH FOUR TIMES DAILY WITHMEALS AND AT BEDTIME   cholecalciferol 1000 units tablet Commonly known as:  VITAMIN D Take 1,000 Units by mouth every morning.   denosumab 60 MG/ML Sosy injection Commonly known as:  PROLIA Inject 60 mg into the skin every 6 (six) months.   dronedarone 400 MG tablet Commonly known as:  MULTAQ TAKE ONE TABLET TWICE DAILY WITH A MEAL   ELIQUIS 5 MG Tabs tablet Generic drug:  apixaban TAKE ONE TABLET BY MOUTH TWICE DAILY   folic acid 1 MG tablet Commonly known as:  FOLVITE Take 1 mg by mouth daily.   loratadine 10 MG tablet Commonly known as:  CLARITIN Take 10 mg by mouth daily.   multivitamin with minerals Tabs tablet Take 1 tablet by mouth daily.   nitroGLYCERIN 0.4 MG SL tablet Commonly known as:  NITROSTAT Place 1 tablet (0.4 mg total) under the tongue every 5 (five) minutes x 3 doses as needed for chest pain.   ondansetron 4 MG  disintegrating tablet Commonly known as:  ZOFRAN-ODT Take 1 tablet (4 mg total) by mouth every 8 (eight) hours as needed for nausea or vomiting.   pantoprazole 40 MG tablet Commonly known as:  PROTONIX Take 1 tablet (40 mg total) by mouth daily.   rosuvastatin 10  MG tablet Commonly known as:  CRESTOR Take 10 mg by mouth daily.   TUMS PO Take 1 tablet by mouth 2 (two) times daily as needed (heartburn). For calcium          Objective:   Physical Exam BP 134/78 (BP Location: Left Arm, Patient Position: Sitting, Cuff Size: Small)   Pulse 69   Temp 97.9 F (36.6 C) (Oral)   Resp 16   Ht 6\' 2"  (1.88 m)   Wt 145 lb 8 oz (66 kg)   SpO2 96%   BMI 18.68 kg/m  General:   Well developed, NAD, slightly underweight appearing.  HEENT:  Normocephalic . Face symmetric, atraumatic Lymphatic system: No unusual lymph nodes at the neck, axillary and groin areas Lungs:  CTA B Normal respiratory effort, no intercostal retractions, no accessory muscle use. Heart: RRR,  no murmur.  no pretibial edema bilaterally  Abdomen:  Not distended, soft, non-tender. No rebound or rigidity.   Skin: Not pale. Not jaundice Neurologic:  alert & oriented X3.  Speech normal, gait appropriate for age and unassisted Psych--  Cognition and judgment appear intact.  Cooperative with normal attention span and concentration.  Behavior appropriate. No anxious or depressed appearing.     Assessment & Plan:   Assessment Prediabetes, A1c 6.0 (2015) Hyperlipidemia Anxiety, depression:  -intol to zoloft, Rx lexapro 5 mg 07-2015: intolerant d/t nausea  -lost wife July 2017 CV: ---CAD--CABG 2011 ----P- Atrial fibrillation, PVCs. --Multaq, Elliquis  ----Palpable Ao x years, [place] (-) for AAA 2005 COPD (per CXR 06-2015), PFTs 10-04-15 mild obstruction DJD  Gait-- uses a walker prn GI: GERD, IBS, diverticulosis GU: elevated PSA, BPH , (-) bx 2014 Osteoporosis: -hip FX 2008 - dexa ~2005 --> rx fosamax, took x  1 year, dexa ~2010 was rec no fosamax at that time. Dexa 09-2015: T score -2.4 --> rx fosamax, d/c d/t jaw pain 06-2017; prolia #26 February 1760 Lichen planus Headache -- saw neurology 02-2015, had a MRI/MRA (-)  PLAN Nausea: Status post recent admission to the hospital, EGD done, biopsy with no H. pylori.  Overall feeling better.    He is concerned about some of the symptoms being related to Multaq.  Recommend to discuss with GI. Night sweats: During the hospital admission, he had some fever, no source of infection was identified.  Now, he reports night sweats, no actual fevers. There is no unusual lymph nodes, had a CT of the abdomen-pelvis- chest (08/16/18) with some chest abnormalities. Plan: CBC, UA, urine culture, peripheral blood smear, chest x-ray.  If he continued to have night sweats in 2 weeks he would let me know Abnormal CT chest: In light of night sweats, will order repeat CT chest when he comes back. Jaw pain as described above, improving, observe RTC 2 months

## 2018-09-20 NOTE — Progress Notes (Signed)
Pre visit review using our clinic review tool, if applicable. No additional management support is needed unless otherwise documented below in the visit note. 

## 2018-09-20 NOTE — Patient Instructions (Signed)
GO TO THE LAB : Get the blood work     GO TO THE FRONT DESK Schedule your next appointment for a checkup in 2 months  STOP BY THE FIRST FLOOR:  get the XR   Please call me in 2 weeks if you continue experiencing night sweats

## 2018-09-22 NOTE — Assessment & Plan Note (Signed)
Nausea: Status post recent admission to the hospital, EGD done, biopsy with no H. pylori.  Overall feeling better.    He is concerned about some of the symptoms being related to Multaq.  Recommend to discuss with GI. Night sweats: During the hospital admission, he had some fever, no source of infection was identified.  Now, he reports night sweats, no actual fevers. There is no unusual lymph nodes, had a CT of the abdomen-pelvis- chest (08/16/18) with some chest abnormalities. Plan: CBC, UA, urine culture, peripheral blood smear, chest x-ray.  If he continued to have night sweats in 2 weeks he would let me know Abnormal CT chest: In light of night sweats, will order repeat CT chest when he comes back. Jaw pain as described above, improving, observe RTC 2 months

## 2018-09-23 LAB — PATHOLOGIST SMEAR REVIEW

## 2018-09-23 LAB — URINE CULTURE
MICRO NUMBER:: 91286549
Result:: NO GROWTH
SPECIMEN QUALITY:: ADEQUATE

## 2018-09-25 ENCOUNTER — Ambulatory Visit: Payer: Medicare Other | Admitting: Gastroenterology

## 2018-09-25 ENCOUNTER — Telehealth: Payer: Self-pay | Admitting: Cardiovascular Disease

## 2018-09-25 ENCOUNTER — Telehealth: Payer: Self-pay

## 2018-09-25 ENCOUNTER — Encounter: Payer: Self-pay | Admitting: Gastroenterology

## 2018-09-25 VITALS — BP 116/70 | HR 71 | Ht 74.0 in | Wt 142.1 lb

## 2018-09-25 DIAGNOSIS — R1013 Epigastric pain: Secondary | ICD-10-CM | POA: Diagnosis not present

## 2018-09-25 DIAGNOSIS — R634 Abnormal weight loss: Secondary | ICD-10-CM

## 2018-09-25 DIAGNOSIS — R110 Nausea: Secondary | ICD-10-CM

## 2018-09-25 MED ORDER — SUCRALFATE 1 GM/10ML PO SUSP: ORAL | 1 refills | Status: DC

## 2018-09-25 NOTE — Telephone Encounter (Signed)
New Message:     Pt said he just left his GI doctor. He wanted you to know that Dr Lyndel Safe will be calling to see if he can stop taking his Multaq. He said he was having problems with this medicine.n

## 2018-09-25 NOTE — Progress Notes (Signed)
Chief Complaint: Chronic nausea  Referring Provider:  Colon Branch, MD      ASSESSMENT AND PLAN;   #1. Chronic nausea with dyspepsia -likely due to medications like Multaq. Neg EGD 08/2018, Negative [place] 08/2018, CT abdo/pelvis 08/16/2018; normal CBC, CMP, TSH and PSA 2019.  #2. Weight Loss - neg EGD, CT 08/2018, neg colon in past.   #3.  GERD with small HH.  #4. Multiple comorbid conditions including A. fib, COPD, CAD, anxiety and advanced age.  Plan: -Pt to discuss with Dr Johnsie Cancel regarding Multaq. Will send message to his nurse. -Continue protonix 40mg  po qd. -Continue to monitor weight. -If still with problems consider solid-phase gastric emptying scan. -FU in 12 weeks.  Earlier, if still with problems.    HPI:    [name] is a 84 y.o. male  With history of long-standing nausea With continued weight loss-has lost 20 pounds over the last 3 months. Admitted pain/nausea Neg CT chest abdomen and pelvis, EGD with biopsies, [place] and chest x-ray Symptoms thought to be due to decreased p.o. Intake d/t Multaq. Occurs mostly around 1030 to 11 AM almost every day.  He takes Multaq at around 7-8 AM. He does not have nausea after taking evening dose as he goes to bed after. Denies having any significant vomiting Does not feel like eating at that time Denies having any heartburn, odynophagia, dysphagia, melena or hematochezia. First thought that the nausea is because of eye problems-negative ophthalmology evaluation Denies having any dizziness or vertigo. Denies any any significant diarrhea or constipation.  No abdominal pain. Zofran did help.  He would like to use it on as-needed basis.   Past Medical History:  Diagnosis Date  . Anxiety   . Atrial fibrillation ([place])   . Blood transfusion without reported diagnosis   . CAD    a. 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  . Cataract   . Coronary artery  disease   . Diverticulosis 2011   Diverticulitis 2004  . Esophageal reflux   . Headache    saw neuro 4-16, MRI done  . HYPERPLASIA, PROSTATE NOS W/URINARY OBST/LUTS   . IBS   . INGUINAL HERNIA   . Lichen planus [id number]   [name]  . LUMBAR RADICULOPATHY, RIGHT   . Mixed hyperlipidemia   . OSTEOPOROSIS   . PONV (postoperative nausea and vomiting)   . PREMATURE VENTRICULAR CONTRACTIONS    a. After CABG - was on Coumadin/Multaq for a period of time. Coumadin discontinued 04/2010 after maintaining NSR.  [name] UNSPECIFIED ANEMIA     Past Surgical History:  Procedure Laterality Date  . BIOPSY  09/10/2018   Procedure: BIOPSY;  Surgeon: Jerene Bears, MD;  Location: [facility];  Service: Gastroenterology;;  . CATARACT EXTRACTION Right 10/06/2013  . CORONARY ARTERY BYPASS GRAFT     2 VD;anomalous RCA;post op compliacted by AF  . ESOPHAGOGASTRODUODENOSCOPY (EGD) WITH PROPOFOL N/A 09/10/2018   Procedure: ESOPHAGOGASTRODUODENOSCOPY (EGD) WITH PROPOFOL;  Surgeon: Jerene Bears, MD;  Location: [facility];  Service: Gastroenterology;  Laterality: N/A;  . HIP FRACTURE SURGERY Right 03/2007   Dr. Salvadore Farber  . INGUINAL HERNIA REPAIR Left 2012   Dr Brantley Stage  . KNEE SURGERY Right 1977   repair  . PROSTATE BIOPSY  09/19/2013   [facility]    Family History  Problem Relation Age of Onset  . Stroke Mother   .  Pancreatic cancer Father        Deceased, 42  . Breast cancer Sister        Deceased  . Heart disease Sister 18  . Stroke Maternal Aunt   . Colon cancer Neg Hx   . Prostate cancer Neg Hx   . Esophageal cancer Neg Hx   . Rectal cancer Neg Hx   . Stomach cancer Neg Hx     Social History   Tobacco Use  . Smoking status: Former Smoker    Types: Cigarettes    Last attempt to quit: 11/27/1962    Years since quitting: 55.8  . Smokeless tobacco: Never Used  Substance Use Topics  . Alcohol use: Not Currently    Alcohol/week: 0.0 standard drinks    Comment: quit drinking   .  Drug use: No    Current Outpatient Medications  Medication Sig Dispense Refill  . Calcium Carbonate Antacid (TUMS PO) Take 1 tablet by mouth 2 (two) times daily as needed (heartburn). For calcium    . CARAFATE 1 GM/10ML suspension TAKE 10ML BY MOUTH FOUR TIMES DAILY WITHMEALS AND AT BEDTIME 420 mL 1  . cholecalciferol (VITAMIN D) 1000 UNITS tablet Take 1,000 Units by mouth every morning.      . dronedarone (MULTAQ) 400 MG tablet TAKE ONE TABLET TWICE DAILY WITH A MEAL (Patient taking differently: Take 400 mg by mouth 2 (two) times daily with a meal. ) 60 tablet 8  . ELIQUIS 5 MG TABS tablet TAKE ONE TABLET BY MOUTH TWICE DAILY (Patient taking differently: Take 5 mg by mouth 2 (two) times daily. ) 60 tablet 5  . folic acid (FOLVITE) 1 MG tablet Take 1 mg by mouth daily.     . Multiple Vitamin (MULITIVITAMIN WITH MINERALS) TABS Take 1 tablet by mouth daily.      . nitroGLYCERIN (NITROSTAT) 0.4 MG SL tablet Place 1 tablet (0.4 mg total) under the tongue every 5 (five) minutes x 3 doses as needed for chest pain. 30 tablet 2  . pantoprazole (PROTONIX) 40 MG tablet Take 1 tablet (40 mg total) by mouth daily. 30 tablet 3  . rosuvastatin (CRESTOR) 10 MG tablet Take 10 mg by mouth daily.     [name] loratadine (CLARITIN) 10 MG tablet Take 10 mg by mouth as needed.     . ondansetron (ZOFRAN ODT) 4 MG disintegrating tablet Take 1 tablet (4 mg total) by mouth every 8 (eight) hours as needed for nausea or vomiting. (Patient not taking: Reported on 09/25/2018) 12 tablet 0   No current facility-administered medications for this visit.     Allergies  Allergen Reactions  . Zoloft [Sertraline Hcl]     Patient didn't like the way it made him feel  . Remeron [Mirtazapine] Other (See Comments)    Excessive Drowsiness    Review of Systems:  Neg except for HPI     Physical Exam:    BP 116/70   Pulse 71   Ht 6\' 2"  (1.88 m)   Wt 142 lb 2 oz (64.5 kg)   BMI 18.25 kg/m  Filed Weights   09/25/18 0929    Weight: 142 lb 2 oz (64.5 kg)   Constitutional:  Well-developed, in no acute distress. Psychiatric: Normal mood and affect. Behavior is normal. HEENT: Pupils normal.  Conjunctivae are normal. No scleral icterus. Neck supple.  Cardiovascular: Normal rate, regular rhythm. No edema Pulmonary/chest: Effort normal and breath sounds normal. No wheezing, rales or rhonchi. Abdominal: Soft, nondistended. Nontender. Bowel  sounds active throughout. There are no masses palpable. No hepatomegaly. Rectal:  defered Neurological: Alert and oriented to person place and time. Skin: Skin is warm and dry. No rashes noted.  Data Reviewed: I have personally reviewed following labs and imaging studies  CBC: CBC Latest Ref Rng & Units 09/20/2018 09/11/2018 09/09/2018  WBC 4.0 - 10.5 K/uL 10.3 5.3 5.0  Hemoglobin 13.0 - 17.0 g/dL 13.1 12.9(L) 13.1  Hematocrit 39.0 - 52.0 % 39.5 39.0 40.4  Platelets 150.0 - 400.0 K/uL 341.0 185 169    CMP: CMP Latest Ref Rng & Units 09/09/2018 09/08/2018 08/16/2018  Glucose 70 - 99 mg/dL 110(H) 97 89  BUN 8 - 23 mg/dL 10 11 13   Creatinine 0.61 - 1.24 mg/dL 0.83 0.88 1.02  Sodium 135 - 145 mmol/L 136 135 134(L)  Potassium 3.5 - 5.1 mmol/L 4.4 3.8 4.0  Chloride 98 - 111 mmol/L 100 95(L) 100  CO2 22 - 32 mmol/L 25 26 23   Calcium 8.9 - 10.3 mg/dL 8.8(L) 9.2 9.0  Total Protein 6.5 - 8.1 g/dL 6.4(L) 7.0 6.5  Total Bilirubin 0.3 - 1.2 mg/dL 0.3 0.6 0.8  Alkaline Phos 38 - 126 U/L 39 41 40  AST 15 - 41 U/L 22 28 26   ALT 0 - 44 U/L 15 19 18       Carmell Austria, MD 09/25/2018, 9:59 AM  Cc: Colon Branch, MD

## 2018-09-25 NOTE — Telephone Encounter (Signed)
Patient notified by phone.

## 2018-09-25 NOTE — Telephone Encounter (Signed)
-----   Message from Josue Hector, MD sent at 09/25/2018  2:48 PM EDT ----- Ok to hold multaq and see if nausea is improved  ----- Message ----- From: Karena Addison, CMA Sent: 09/25/2018   2:30 PM EDT To: Josue Hector, MD

## 2018-09-25 NOTE — Telephone Encounter (Signed)
Called patient back about Dr. Kyla Balzarine recommendations. Patient will hold his multaq and will give our office a call to let us know if his nausea improves.

## 2018-09-25 NOTE — Telephone Encounter (Signed)
Will send Dr. Johnsie Cancel a message letting him know Dr. Lyndel Safe will be contacting him about stopping multaq, due to chronic nausea.

## 2018-09-25 NOTE — Patient Instructions (Signed)
If you are age 82 or older, your body mass index should be between 23-30. Your Body mass index is 18.25 kg/m. If this is out of the aforementioned range listed, please consider follow up with your Primary Care Provider.  If you are age 39 or younger, your body mass index should be between 19-25. Your Body mass index is 18.25 kg/m. If this is out of the aformentioned range listed, please consider follow up with your Primary Care Provider.   We will be contacting your Cardiologist to see if you can stop a medication and will be back in contact with you.   Thank you,  Dr. Jackquline Denmark

## 2018-09-25 NOTE — Telephone Encounter (Signed)
Ok to [employer] and see if nausea improves

## 2018-09-30 ENCOUNTER — Other Ambulatory Visit: Payer: Self-pay | Admitting: Cardiovascular Disease

## 2018-10-15 DIAGNOSIS — B353 Tinea pedis: Secondary | ICD-10-CM | POA: Diagnosis not present

## 2018-10-15 DIAGNOSIS — Z85828 Personal history of other malignant neoplasm of skin: Secondary | ICD-10-CM | POA: Diagnosis not present

## 2018-10-15 DIAGNOSIS — B351 Tinea unguium: Secondary | ICD-10-CM | POA: Diagnosis not present

## 2018-10-15 DIAGNOSIS — D692 Other nonthrombocytopenic purpura: Secondary | ICD-10-CM | POA: Diagnosis not present

## 2018-10-15 DIAGNOSIS — L853 Xerosis cutis: Secondary | ICD-10-CM | POA: Diagnosis not present

## 2018-10-15 DIAGNOSIS — L570 Actinic keratosis: Secondary | ICD-10-CM | POA: Diagnosis not present

## 2018-11-22 ENCOUNTER — Ambulatory Visit (INDEPENDENT_AMBULATORY_CARE_PROVIDER_SITE_OTHER): Payer: Medicare Other | Admitting: Internal Medicine

## 2018-11-22 ENCOUNTER — Encounter: Payer: Self-pay | Admitting: Internal Medicine

## 2018-11-22 VITALS — BP 108/43 | HR 58 | Temp 97.4°F | Resp 16 | Ht 74.0 in | Wt 150.0 lb

## 2018-11-22 DIAGNOSIS — E785 Hyperlipidemia, unspecified: Secondary | ICD-10-CM

## 2018-11-22 DIAGNOSIS — M810 Age-related osteoporosis without current pathological fracture: Secondary | ICD-10-CM

## 2018-11-22 DIAGNOSIS — I480 Paroxysmal atrial fibrillation: Secondary | ICD-10-CM

## 2018-11-22 DIAGNOSIS — I25119 Atherosclerotic heart disease of native coronary artery with unspecified angina pectoris: Secondary | ICD-10-CM | POA: Diagnosis not present

## 2018-11-22 LAB — LIPID PANEL
CHOLESTEROL: 102 mg/dL (ref 0–200)
HDL: 60.8 mg/dL (ref >39.00)
LDL Cholesterol: 23 mg/dL (ref 0–99)
NonHDL: 41.12
Total CHOL/HDL Ratio: 2
Triglycerides: 91 mg/dL (ref 0.0–149.0)
VLDL: 18.2 mg/dL (ref 0.0–40.0)

## 2018-11-22 NOTE — Progress Notes (Signed)
Subjective:    Patient ID: [name], male    DOB: 07-23-1930, 84 y.o.   MRN: [id number]  DOS:  11/22/2018 Type of visit - description: Routine visit In general feels well. Nausea largely resolved after she stop Multaq Refuses Prolia, he experienced some jaw problems mostly "jaw out of place when eating", he believes is due to Prolia.  Symptoms are now better.   Review of Systems Denies vomiting, diarrhea No chest pain, palpitations, edema or shortness of breath No night sweats.  Past Medical History:  Diagnosis Date  . Anxiety   . Atrial fibrillation ([place])   . Blood transfusion without reported diagnosis   . CAD    a. 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  . Cataract   . Coronary artery disease   . Diverticulosis 2011   Diverticulitis 2004  . Esophageal reflux   . Headache    saw neuro 4-16, MRI done  . HYPERPLASIA, PROSTATE NOS W/URINARY OBST/LUTS   . IBS   . INGUINAL HERNIA   . Lichen planus [id number]   [name]  . LUMBAR RADICULOPATHY, RIGHT   . Mixed hyperlipidemia   . OSTEOPOROSIS   . PONV (postoperative nausea and vomiting)   . PREMATURE VENTRICULAR CONTRACTIONS    a. After CABG - was on Coumadin/Multaq for a period of time. Coumadin discontinued 04/2010 after maintaining NSR.  [name] UNSPECIFIED ANEMIA     Past Surgical History:  Procedure Laterality Date  . BIOPSY  09/10/2018   Procedure: BIOPSY;  Surgeon: Jerene Bears, MD;  Location: [facility];  Service: Gastroenterology;;  . CATARACT EXTRACTION Right 10/06/2013  . CORONARY ARTERY BYPASS GRAFT     2 VD;anomalous RCA;post op compliacted by AF  . ESOPHAGOGASTRODUODENOSCOPY (EGD) WITH PROPOFOL N/A 09/10/2018   Procedure: ESOPHAGOGASTRODUODENOSCOPY (EGD) WITH PROPOFOL;  Surgeon: Jerene Bears, MD;  Location: [facility];  Service: Gastroenterology;  Laterality: N/A;  . HIP FRACTURE SURGERY Right 03/2007   Dr. Salvadore Farber  .  INGUINAL HERNIA REPAIR Left 2012   Dr Brantley Stage  . KNEE SURGERY Right 1977   repair  . PROSTATE BIOPSY  09/19/2013   [facility]    Social History   Socioeconomic History  . Marital status: Widowed    Spouse name: Not on file  . Number of children: 0  . Years of education: Not on file  . Highest education level: Not on file  Occupational History  . Occupation: retired    Fish farm manager: RETIRED  Social Needs  . Financial resource strain: Not on file  . Food insecurity:    Worry: Not on file    Inability: Not on file  . Transportation needs:    Medical: Not on file    Non-medical: Not on file  Tobacco Use  . Smoking status: Former Smoker    Types: Cigarettes    Last attempt to quit: 11/27/1962    Years since quitting: 56.0  . Smokeless tobacco: Never Used  Substance and Sexual Activity  . Alcohol use: Not Currently    Alcohol/week: 0.0 standard drinks    Comment: quit drinking   . Drug use: No  . Sexual activity: Not Currently  Lifestyle  . Physical activity:    Days per week: Not on file    Minutes per session: Not on file  . Stress: Not on file  Relationships  . Social connections:    Talks on  phone: Not on file    Gets together: Not on file    Attends religious service: Not on file    Active member of club or organization: Not on file    Attends meetings of clubs or organizations: Not on file    Relationship status: Not on file  . Intimate partner violence:    Fear of current or ex partner: Not on file    Emotionally abused: Not on file    Physically abused: Not on file    Forced sexual activity: Not on file  Other Topics Concern  . Not on file  Social History Narrative   Lives at [facility] in independent living.     Lost wife 06-2016.     Has no children.  Family: nephews-nices in [place]   Retired from [employer].     Still drives.       Allergies as of 11/22/2018      Reactions   Zoloft [sertraline Hcl]    Patient didn't like the way it made  him feel   Remeron [mirtazapine] Other (See Comments)   Excessive Drowsiness      Medication List       Accurate as of November 22, 2018  1:35 PM. Always use your most recent med list.        cholecalciferol 1000 units tablet Commonly known as:  VITAMIN D Take 1,000 Units by mouth every morning.   ELIQUIS 5 MG Tabs tablet Generic drug:  apixaban TAKE ONE TABLET BY MOUTH TWICE DAILY   folic acid 1 MG tablet Commonly known as:  FOLVITE TAKE ONE (1) TABLET BY MOUTH EACH DAY   loratadine 10 MG tablet Commonly known as:  CLARITIN Take 10 mg by mouth as needed.   multivitamin with minerals Tabs tablet Take 1 tablet by mouth daily.   nitroGLYCERIN 0.4 MG SL tablet Commonly known as:  NITROSTAT Place 1 tablet (0.4 mg total) under the tongue every 5 (five) minutes x 3 doses as needed for chest pain.   pantoprazole 40 MG tablet Commonly known as:  PROTONIX Take 1 tablet (40 mg total) by mouth daily.   rosuvastatin 10 MG tablet Commonly known as:  CRESTOR TAKE ONE (1) TABLET BY MOUTH EVERY DAY   TUMS PO Take 1 tablet by mouth 2 (two) times daily as needed (heartburn). For calcium           Objective:   Physical Exam BP (!) 108/43 (BP Location: Left Arm, Patient Position: Sitting, Cuff Size: Small)   Pulse (!) 58   Temp (!) 97.4 F (36.3 C) (Oral)   Resp 16   Ht 6\' 2"  (1.88 m)   Wt 150 lb (68 kg)   SpO2 100%   BMI 19.26 kg/m  General:   Well developed, NAD, BMI noted. HEENT:  Normocephalic . Face symmetric, atraumatic Lungs:  CTA B Normal respiratory effort, no intercostal retractions, no accessory muscle use. Heart: RRR,  no murmur.  No pretibial edema bilaterally  Skin: Not pale. Not jaundice Neurologic:  alert & oriented X3.  Speech normal, gait appropriate for age and unassisted Psych--  Cognition and judgment appear intact.  Cooperative with normal attention span and concentration.  Behavior appropriate. No anxious or depressed appearing.       Assessment     Assessment Prediabetes, A1c 6.0 (2015) Hyperlipidemia Anxiety, depression:  -intol to zoloft, Rx lexapro 5 mg 07-2015: intolerant d/t nausea  -lost wife July 2017 CV: ---CAD--CABG 2011 ----P- Atrial fibrillation,  PVCs. --Multaq, Elliquis  ----Palpable Ao x years, [place] (-) for AAA 2005 COPD (per CXR 06-2015), PFTs 10-04-15 mild obstruction DJD  Gait-- uses a walker prn GI: GERD, IBS, diverticulosis GU: elevated PSA, BPH , (-) bx 2014 Osteoporosis: -hip FX 2008 - dexa ~2005 --> rx fosamax, took x 1 year, dexa ~2010 was rec no fosamax at that time. Dexa 09-2015: T score -2.4 --> rx fosamax, d/c d/t jaw pain 06-2017; prolia #26 February 6003 Lichen planus Headache -- saw neurology 02-2015, had a MRI/MRA (-)  PLAN Hyperlipidemia, continue statins, check a FLP CAD, atrial fibrillation: Currently on Eliquis.  Multaq was discontinued, since then nausea stopped.  Patient is asymptomatic.  No change.  Will let cardiology know pt is off Multaq. Osteoporosis: Refuses Prolia, states that his jaw got out of place on and off and he believes is due to Prolia, I explained the patient that is not likely the case.  We had extensive discussions regards other options including Evista (multiple s/e possible), Prolia (daily injections), etxc.  We also discussed possibly a different biphosphonate but declined, is  convinced that Fosamax caused jaw problems as well. Risk of fractures discussed as well.  At the end, I mention possibly a referral and he agreed to be referred to endocrinology for further advice.. Night sweats: Largely resolved Abnormal CT chest: See 08/20/18 OV,  CT chest was abnormal but is stable compared to few years ago, recheck a CT was  not mandatory.  The patient is now asymptomatic.  Recommend observation. RTC 6 months  Today, I spent more than 25  min with the patient: >50% of the time counseling regards the risk of osteoporosis, multiple options available, each was discussed with  its respective side effects.  Multiple questions answered.

## 2018-11-22 NOTE — Patient Instructions (Addendum)
GO TO THE LAB : Get the blood work     GO TO THE FRONT DESK Schedule your next appointment for a  Annual exam in 4 to 5 months

## 2018-11-22 NOTE — Assessment & Plan Note (Signed)
Hyperlipidemia, continue statins, check a FLP CAD, atrial fibrillation: Currently on Eliquis.  Multaq was discontinued, since then nausea stopped.  Patient is asymptomatic.  No change.  Will let cardiology know pt is off Multaq. Osteoporosis: Refuses Prolia, states that his jaw got out of place on and off and he believes is due to Prolia, I explained the patient that is not likely the case.  We had extensive discussions regards other options including Evista (multiple s/e possible), Prolia (daily injections), etxc.  We also discussed possibly a different biphosphonate but declined, is  convinced that Fosamax caused jaw problems as well. Risk of fractures discussed as well.  At the end, I mention possibly a referral and he agreed to be referred to endocrinology for further advice.. Night sweats: Largely resolved Abnormal CT chest: See 08/20/18 OV,  CT chest was abnormal but is stable compared to few years ago, recheck a CT was  not mandatory.  The patient is now asymptomatic.  Recommend observation. RTC 6 months

## 2018-12-09 ENCOUNTER — Encounter: Payer: Self-pay | Admitting: Internal Medicine

## 2018-12-09 ENCOUNTER — Ambulatory Visit: Payer: Medicare Other | Admitting: Internal Medicine

## 2018-12-09 VITALS — BP 120/70 | HR 75 | Ht 74.0 in | Wt 149.0 lb

## 2018-12-09 DIAGNOSIS — M810 Age-related osteoporosis without current pathological fracture: Secondary | ICD-10-CM | POA: Diagnosis not present

## 2018-12-09 LAB — VITAMIN D 25 HYDROXY (VIT D DEFICIENCY, FRACTURES): VITD: 50.45 ng/mL (ref 30.00–100.00)

## 2018-12-09 NOTE — Patient Instructions (Addendum)
Please stop at the lab.  Continue vitamin D 1000 units daily and the multivitamin.  Stop at least 1 on the 2 Tums a day.   Please think about Teriparatide/Abaloparatide (once a day for 2 years) or Romosozumab (once a month for 1 year). Please let me know which you decide about.  We will schedule a new DEXA scan for you.  Please come back for a follow-up appointment in 1 year.   How Can I Prevent Falls? Men and women with osteoporosis need to take care not to fall down. Falls can break bones. Some reasons people fall are: Poor vision  Poor balance  Certain diseases that affect how you walk  Some types of medicine, such as sleeping pills.  Some tips to help prevent falls outdoors are: Use a cane or walker  Wear rubber-soled shoes so you don't slip  Walk on grass when sidewalks are slippery  In winter, put salt or kitty litter on icy sidewalks.  Some ways to help prevent falls indoors are: Keep rooms free of clutter, especially on floors  Use plastic or carpet runners on slippery floors  Wear low-heeled shoes that provide good support  Do not walk in socks, stockings, or slippers  Be sure carpets and area rugs have skid-proof backs or are tacked to the floor  Be sure stairs are well lit and have rails on both sides  Put grab bars on bathroom walls near tub, shower, and toilet  Use a rubber bath mat in the shower or tub  Keep a flashlight next to your bed  Use a sturdy step stool with a handrail and wide steps  Add more lights in rooms (and night lights) Buy a cordless phone to keep with you so that you don't have to rush to the phone       when it rings and so that you can call for help if you fall.   (adapted from http://www.ni[employer]life[employer].se)   Exercise for Strong Bones (from [place]) There are two types of exercises that are important for building and maintaining bone density:  weight-bearing and  muscle-strengthening exercises. Weight-bearing Exercises These exercises include activities that make you move against gravity while staying upright. Weight-bearing exercises can be high-impact or low-impact. High-impact weight-bearing exercises help build bones and keep them strong. If you have broken a bone due to osteoporosis or are at risk of breaking a bone, you may need to avoid high-impact exercises. If you're not sure, you should check with your healthcare provider. Examples of high-impact weight-bearing exercises are: . Dancing . Doing high-impact aerobics . Hiking . Jogging/running . Jumping Rope . Stair climbing . Tennis Low-impact weight-bearing exercises can also help keep bones strong and are a safe alternative if you cannot do high-impact exercises. Examples of low-impact weight-bearing exercises are: . Using elliptical training machines . Doing low-impact aerobics . Using stair-step machines . Fast walking on a treadmill or outside Muscle-Strengthening Exercises These exercises include activities where you move your body, a weight or some other resistance against gravity. They are also known as resistance exercises and include: . Lifting weights . Using elastic exercise bands . Using weight machines . Lifting your own body weight . Functional movements, such as standing and rising up on your toes Yoga and Pilates can also improve strength, balance and flexibility. However, certain positions may not be safe for people with osteoporosis or those at increased risk of broken bones. For example, exercises that have you bend forward may increase  the chance of breaking a bone in the spine. A physical therapist should be able to help you learn which exercises are safe and appropriate for you. Non-Impact Exercises Non-impact exercises can help you to improve balance, posture and how well you move in everyday activities. These exercises can also help to increase muscle strength and  decrease the risk of falls and broken bones. Some of these exercises include: . Balance exercises that strengthen your legs and test your balance, such as Tai Chi, can decrease your risk of falls. . Posture exercises that improve your posture and reduce rounded or "sloping" shoulders can help you decrease the chance of breaking a bone, especially in the spine. . Functional exercises that improve how well you move can help you with everyday activities and decrease your chance of falling and breaking a bone. For example, if you have trouble getting up from a chair or climbing stairs, you should do these activities as exercises. A physical therapist can teach you balance, posture and functional exercises. Starting a New Exercise Program If you haven't exercised regularly for a while, check with your healthcare provider before beginning a new exercise program-particularly if you have health problems such as heart disease, diabetes or high blood pressure. If you're at high risk of breaking a bone, you should work with a physical therapist to develop a safe exercise program. Once you have your healthcare provider's approval, start slowly. If you've already broken bones in the spine because of osteoporosis, be very careful to avoid activities that require reaching down, bending forward, rapid twisting motions, heavy lifting and those that increase your chance of a fall. As you get started, your muscles may feel sore for a day or two after you exercise. If soreness lasts longer, you may be working too hard and need to ease up. Exercises should be done in a pain-free range of motion. How Much Exercise Do You Need? Weight-bearing exercises 30 minutes on most days of the week. Do a 30-minutesession or multiple sessions spread out throughout the day. The benefits to your bones are the same.   Muscle-strengthening exercises Two to three days per week. If you don't have much time for strengthening/resistance training,  do small amounts at a time. You can do just one body part each day. For example do arms one day, legs the next and trunk the next. You can also spread these exercises out during your normal day.  Balance, posture and functional exercises Every day or as often as needed. You may want to focus on one area more than the others. If you have fallen or lose your balance, spend time doing balance exercises. If you are getting rounded shoulders, work more on posture exercises. If you have trouble climbing stairs or getting up from the couch, do more functional exercises. You can also perform these exercises at one time or spread them during your day. Work with a phyiscal therapist to learn the right exercises for you.   Teriparatide injection What is this medicine? TERIPARATIDE (terr ih PAR a tyd) increases bone mass and strength. It helps make healthy bone and to slow bone loss. This medicine is used to prevent bone fractures. This medicine may be used for other purposes; ask your health care provider or pharmacist if you have questions. COMMON BRAND NAME(S): Forteo What should I tell my health care provider before I take this medicine? They need to know if you have any of these conditions: -bone disease other than osteoporosis -high levels  of calcium in the blood -history of cancer in the bone -kidney stone -Paget's disease -parathyroid disease -receiving radiation therapy -an unusual or allergic reaction to teriparatide, other medicines, foods, dyes, or preservatives -pregnant or trying to get pregnant -breast-feeding How should I use this medicine? This medicine is for injection under the skin. You will be taught how to prepare and give this medicine. Use exactly as directed. Take your medicine at regular intervals. Do not take your medicine more often than directed. It is important that you put your used needles and pens in a special sharps container. Do not put them in a trash can. If you do not  have a sharps container, call your pharmacist or health care provider to get one. A special MedGuide will be given to you by the pharmacist with each prescription and refill. Be sure to read this information carefully each time. Talk to your pediatrician regarding the use of this medicine in children. Special care may be needed. Overdosage: If you think you have taken too much of this medicine contact a poison control center or emergency room at once. NOTE: This medicine is only for you. Do not share this medicine with others. What if I miss a dose? If you miss a dose, take it as soon as you can. If it is almost time for your next dose, take only that dose. Do not take double or extra doses. What may interact with this medicine? -digoxin This list may not describe all possible interactions. Give your health care provider a list of all the medicines, herbs, non-prescription drugs, or dietary supplements you use. Also tell them if you smoke, drink alcohol, or use illegal drugs. Some items may interact with your medicine. What should I watch for while using this medicine? Visit your doctor or health care professional for regular checks on your progress. Your doctor may order blood tests and other tests to see how you are doing. You should make sure you get enough calcium and vitamin D while you are taking this medicine, unless your doctor tells you not to. Discuss the foods you eat and the vitamins you take with your health care professional. Dennis Bast may get drowsy or dizzy. Do not drive, use machinery, or do anything that needs mental alertness until you know how this medicine affects you. Do not stand or sit up quickly, especially if you are an older patient. This reduces the risk of dizzy or fainting spells. Talk to your doctor about your risk of cancer. You may be more at risk for certain types of cancers if you take this medicine. What side effects may I notice from receiving this medicine? Side  effects that you should report to your doctor or health care professional as soon as possible: -allergic reactions like skin rash, itching or hives, swelling of the face, lips, or tongue -blood in the urine; pain in the lower back or side; pain when urinating -signs and symptoms of low blood pressure like dizziness; feeling faint or lightheaded, falls; unusually weak or tired -signs and symptoms of increased calcium like nausea; vomiting; constipation; low energy; or muscle weakness Side effects that usually do not require medical attention (report these to your doctor or health care professional if they continue or are bothersome): -headache -joint pain -nausea -pain, redness, irritation or swelling at the injection site -stomach upset This list may not describe all possible side effects. Call your doctor for medical advice about side effects. You may report side effects to FDA  at 1-800-FDA-1088. Where should I keep my medicine? Keep out of the reach of children. Store the pens in a refrigerator between 2 and 8 degrees C (36 and 46 degrees F). Do not freeze. Use the pen quickly after taking out of the refrigerator and recap and return to refrigerator right after using. Protect from light. Throw away any unused medicine 28 days after the first injection from the pen. Throw away any unused medicine after the expiration date on the label. NOTE: This sheet is a summary. It may not cover all possible information. If you have questions about this medicine, talk to your doctor, pharmacist, or health care provider.  2019 Else[employer] Standard (2016-04-03 10:23:57)  Romosozumab injection What is this medicine? ROMOSOZUMAB (roe moe SOZ ue mab) increases bone formation. It is used to treat osteoporosis in women. This medicine may be used for other purposes; ask your health care provider or pharmacist if you have questions. COMMON BRAND NAME(S): EVENITY What should I tell my health care provider before  I take this medicine? They need to know if you have any of these conditions: -dental disease or wear dentures -heart disease -history of stroke -kidney disease -low levels of calcium in the blood -an unusual or allergic reaction to romosozumab, other medicines, foods, dyes or preservatives -pregnant or trying to get pregnant -breast-feeding How should I use this medicine? This medicine is for injection under the skin. It is given by a healthcare professional in a hospital or clinic setting. Talk to your pediatrician about the use of this medicine in children. Special care may be needed. Overdosage: If you think you have taken too much of this medicine contact a poison control center or emergency room at once. NOTE: This medicine is only for you. Do not share this medicine with others. What if I miss a dose? Keep appointments for follow-up doses. It is important not to miss your dose. Call your doctor or healthcare professional if you are unable to keep an appointment. What may interact with this medicine? Interactions are not expected. This list may not describe all possible interactions. Give your health care provider a list of all the medicines, herbs, non-prescription drugs, or dietary supplements you use. Also tell them if you smoke, drink alcohol, or use illegal drugs. Some items may interact with your medicine. What should I watch for while using this medicine? Your condition will be monitored carefully while you are receiving this medicine. You may need blood work done while you are taking this medicine. Some people who take this medicine have severe bone, joint, or muscle pain. This medicine may also increase your risk for jaw problems or a broken thigh bone. Tell your healthcare professional right away if you have severe pain in your jaw, bones, joints, or muscles. Tell your healthcare professional if you have any pain that does not go away or that gets worse. You should make sure  you get enough calcium and vitamin D while you are taking this medicine. Discuss the foods you eat and the vitamins you take with your healthcare professional. Tell your dentist and dental surgeon that you are taking this medicine. You should not have major dental surgery while on this medicine. See your dentist to have a dental exam and fix any dental problems before starting this medicine. Take good care of your teeth while on this medicine. Make sure you see your dentist for regular follow-up appointments. What side effects may I notice from receiving this medicine? Side effects that  you should report to your doctor or health care professional as soon as possible: -allergic reactions like skin rash, itching or hives, swelling of the face, lips, or tongue -bone pain -chest pain or chest tightness -jaw pain, especially after dental work -signs and symptoms of a stroke like changes in vision; confusion; trouble speaking or understanding; severe headaches; sudden numbness or weakness of the face, arm or leg; trouble walking; dizziness; loss of balance or coordination -signs and symptoms of low calcium like fast heartbeat; muscle cramps; muscle pain; pain, tingling, numbness in the hands or feet; seizures Side effects that usually do not require medical attention (report these to your doctor or health care professional if they continue or are bothersome): -headache -joint pain -pain, redness, or irritation at site where injected -pain, tingling, numbness in the hands or feet -swelling of ankles, feet, hands -trouble sleeping This list may not describe all possible side effects. Call your doctor for medical advice about side effects. You may report side effects to FDA at 1-800-FDA-1088. Where should I keep my medicine? This medicine is given in a hospital or clinic and will not be stored at home. NOTE: This sheet is a summary. It may not cover all possible information. If you have questions about  this medicine, talk to your doctor, pharmacist, or health care provider.  2019 Elsevier/Gold Standard (2018-03-07 01:15:32)

## 2018-12-09 NOTE — Progress Notes (Addendum)
Patient ID: [name], male   DOB: Apr 04, 1930, 85 y.o.   MRN: [id number]    HPI  [name] is a 85 y.o.-year-old [place] male, referred by his PCP, Dr. Larose Kells, for management of osteoporosis.  Pt was dx with OP in 2008.  I reviewed pt's DXA scans: Date L1-L4 T score FN T score 33% distal Radius  10/14/2015 ([place]) -2.3 LFN: -2.6   10/04/2007 -2.5 RFN: -2.8 LFN: -2.3    Fractures: - vertebral compression fractures per review of the abd. CT report from 08/16/2018: moderate T8, mild T9, T10, moderate T12 compression fractures. He feels they developed after lifting heavy objects. - R hip fracture 03/2008 - it appears as atypical fractures: "There is a spiral fracture of the proximal aspect of the right femur.  This begins in the intertrochanteric region and extends into the proximal femur.  There is a avulsion of the lesser trochanter.  The femoral head is located within the acetabulum. Degenerative changes are noted in the lower spine.  Bones appear Osteopenic".  Osteopenia was seen on Xray of sacrum/coccyx 01/14/2016.  No dizziness/vertigo/orthostasis/poor vision.   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 Reviewed available vit D levels: Lab Results  Component Value Date   VD25OH 48 12/18/2011   VD25OH 52 12/14/2009   VD25OH 35 06/09/2009   Pt is on calcium - Tums bid + MVI; on vitamin D supplement - 1000 units + MVI.   + weight bearing exercises - lives at [facility]  - goes to the gym.   He does not take high vitamin A doses.  + FH of osteoporosis: 1 sister, possibly also mother.  No h/o hyper/hypocalcemia (except 1 calcium level 07/2018) or hyperparathyroidism. No h/o kidney stones. Lab Results  Component Value Date   CALCIUM 8.8 (L) 09/09/2018   CALCIUM 9.2 09/08/2018   CALCIUM 9.0  08/16/2018   CALCIUM 9.3 08/14/2018   CALCIUM 9.6 02/19/2018   CALCIUM 9.6 07/10/2017   CALCIUM 9.8 05/21/2017   CALCIUM 10.0 12/29/2016   CALCIUM 10.1 12/11/2016   CALCIUM 9.7 06/30/2016   No h/o thyrotoxicosis. Reviewed TSH recent levels:  Lab Results  Component Value Date   TSH 2.44 08/14/2018   TSH 2.61 02/19/2018   TSH 2.36 08/08/2016   TSH 2.00 08/06/2014   TSH 1.37 01/08/2014   No h/o CKD. Last BUN/Cr: Lab Results  Component Value Date   BUN 10 09/09/2018   CREATININE 0.83 09/09/2018   H/o CABG 2011.  He also has a history of A. Fib.  ROS: Constitutional: no weight gain/loss, no fatigue, no subjective hyperthermia/hypothermia Eyes: no blurry vision, no xerophthalmia ENT: no sore throat, no nodules palpated in throat, no dysphagia/odynophagia, no hoarseness,  + tinnitus Cardiovascular: no CP/SOB/palpitations/leg swelling Respiratory: no cough/SOB Gastrointestinal: no N/V/D/C Musculoskeletal: no muscle/joint aches Skin: no rashes, + easy bruising Neurological: no tremors/numbness/tingling/dizziness Psychiatric: no depression/anxiety  Past Medical History:  Diagnosis Date  . Anxiety   . Atrial fibrillation ([place])   . Blood transfusion without reported diagnosis   . CAD    a. 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  . Cataract   . Coronary artery disease   . Diverticulosis 2011   Diverticulitis 2004  . Esophageal reflux   .  Headache    saw neuro 4-16, MRI done  . HYPERPLASIA, PROSTATE NOS W/URINARY OBST/LUTS   . IBS   . INGUINAL HERNIA   . Lichen planus [id number]   [name]  . LUMBAR RADICULOPATHY, RIGHT   . Mixed hyperlipidemia   . OSTEOPOROSIS   . PONV (postoperative nausea and vomiting)   . PREMATURE VENTRICULAR CONTRACTIONS    a. After CABG - was on Coumadin/Multaq for a period of time. Coumadin discontinued 04/2010 after maintaining NSR.  [name] UNSPECIFIED ANEMIA    Past  Surgical History:  Procedure Laterality Date  . BIOPSY  09/10/2018   Procedure: BIOPSY;  Surgeon: Jerene Bears, MD;  Location: [facility];  Service: Gastroenterology;;  . CATARACT EXTRACTION Right 10/06/2013  . CORONARY ARTERY BYPASS GRAFT     2 VD;anomalous RCA;post op compliacted by AF  . ESOPHAGOGASTRODUODENOSCOPY (EGD) WITH PROPOFOL N/A 09/10/2018   Procedure: ESOPHAGOGASTRODUODENOSCOPY (EGD) WITH PROPOFOL;  Surgeon: Jerene Bears, MD;  Location: [facility];  Service: Gastroenterology;  Laterality: N/A;  . HIP FRACTURE SURGERY Right 03/2007   Dr. Salvadore Farber  . INGUINAL HERNIA REPAIR Left 2012   Dr Brantley Stage  . KNEE SURGERY Right 1977   repair  . PROSTATE BIOPSY  09/19/2013   [facility]   Social History   Socioeconomic History  . Marital status: Widowed    Spouse name: Not on file  . Number of children: 0  . Years of education: Not on file  . Highest education level: Not on file  Occupational History  . Occupation: retired    Fish farm manager: RETIRED  Social Needs  . Financial resource strain: Not on file  . Food insecurity:    Worry: Not on file    Inability: Not on file  . Transportation needs:    Medical: Not on file    Non-medical: Not on file  Tobacco Use  . Smoking status: Former Smoker    Types: Cigarettes    Last attempt to quit: 11/27/1962    Years since quitting: 56.0  . Smokeless tobacco: Never Used  Substance and Sexual Activity  . Alcohol use: Not Currently    Alcohol/week: 0.0 standard drinks    Comment: quit drinking   . Drug use: No  . Sexual activity: Not Currently  Lifestyle  . Physical activity:    Days per week: Not on file    Minutes per session: Not on file  . Stress: Not on file  Relationships  . Social connections:    Talks on phone: Not on file    Gets together: Not on file    Attends religious service: Not on file    Active member of club or organization: Not on file    Attends meetings of clubs or organizations: Not on file     Relationship status: Not on file  . Intimate partner violence:    Fear of current or ex partner: Not on file    Emotionally abused: Not on file    Physically abused: Not on file    Forced sexual activity: Not on file  Other Topics Concern  . Not on file  Social History Narrative   Lives at [place] in independent living.     Lost wife 06-2016.     Has no children.  Family: nephews-nices in [place]   Retired from [employer].     Still drives.    Current Outpatient Medications on File Prior to Visit  Medication Sig Dispense Refill  . Calcium Carbonate  Antacid (TUMS PO) Take 1 tablet by mouth 2 (two) times daily as needed (heartburn). For calcium    . cholecalciferol (VITAMIN D) 1000 UNITS tablet Take 1,000 Units by mouth every morning.      [name] ELIQUIS 5 MG TABS tablet TAKE ONE TABLET BY MOUTH TWICE DAILY (Patient taking differently: Take 5 mg by mouth 2 (two) times daily. ) 60 tablet 5  . folic acid (FOLVITE) 1 MG tablet TAKE ONE (1) TABLET BY MOUTH EACH DAY 30 tablet 10  . loratadine (CLARITIN) 10 MG tablet Take 10 mg by mouth as needed.     . Multiple Vitamin (MULITIVITAMIN WITH MINERALS) TABS Take 1 tablet by mouth daily.      . nitroGLYCERIN (NITROSTAT) 0.4 MG SL tablet Place 1 tablet (0.4 mg total) under the tongue every 5 (five) minutes x 3 doses as needed for chest pain. 30 tablet 2  . pantoprazole (PROTONIX) 40 MG tablet Take 1 tablet (40 mg total) by mouth daily. 30 tablet 3  . rosuvastatin (CRESTOR) 10 MG tablet TAKE ONE (1) TABLET BY MOUTH EVERY DAY 90 tablet 2   No current facility-administered medications on file prior to visit.    Allergies  Allergen Reactions  . Zoloft [Sertraline Hcl]     Patient didn't like the way it made him feel  . Remeron [Mirtazapine] Other (See Comments)    Excessive Drowsiness   Family History  Problem Relation Age of Onset  . Stroke Mother   . Pancreatic cancer Father        Deceased, 68  . Breast cancer Sister        Deceased  .  Heart disease Sister 23  . Stroke Maternal Aunt   . Colon cancer Neg Hx   . Prostate cancer Neg Hx   . Esophageal cancer Neg Hx   . Rectal cancer Neg Hx   . Stomach cancer Neg Hx     PE: BP 120/70   Pulse 75   Ht 6\' 2"  (1.88 m)   Wt 149 lb (67.6 kg)   SpO2 97%   BMI 19.13 kg/m  Wt Readings from Last 3 Encounters:  12/09/18 149 lb (67.6 kg)  11/22/18 150 lb (68 kg)  09/25/18 142 lb 2 oz (64.5 kg)   Constitutional: Thin, in NAD. No kyphosis. Eyes: PERRLA, EOMI, no exophthalmos ENT: moist mucous membranes, no thyromegaly, no cervical lymphadenopathy Cardiovascular: RRR, No MRG Respiratory: CTA B Gastrointestinal: abdomen soft, NT, ND, BS+ Musculoskeletal: no deformities, strength intact in all 4 Skin: moist, warm, no rashes Neurological: no tremor with outstretched hands, DTR normal in all 4  Assessment: 1. Osteoporosis  Plan: 1. Osteoporosis - likely age-related, he also has FH of OP -He has been increased risk of fracture, due to his previous history of fracture and T-scores lower than -2.5.  He has lumbar compression fractures which he thinks he developed from lifting buckets of water for his garden.  He also has a right hip fracture, which, from the description in the report, appears to have been a typical osteoporosis fracture.  When he developed this, he was on Fosamax.  His orthopedic doctor advised him to stop Fosamax and he did for many years, but this was restarted after her latest bone density and his scores were again in the osteoporosis range.  However, he developed jaw degeneration (ONJ?) after starting back Fosamax.  Of note, he was switched then to Prolia and he again developed jaw degeneration while on this medication.  He  is wondering whether he absolutely needs to use another medicine, and if so, which medicines are left. - we reviewed her dietary and supplemental calcium and vitamin D intake.  We discussed that he is probably getting too much calcium from the  supplements that he is taking.  He is taking 2 Tums tablets a day and also approximately 500 mg daily from multivitamins.  I advised him to stop at least 1 of the times.  I recommended to make sure he gets 1000-1200 mg of calcium daily preferentially from diet (he has a diet rich in green leafy vegetables and also drinks almond milk).  I will check vit D today to see if he needs supplementation.  He is taking 1000 units vitamin D daily + the amount in the multivitamin. - discussed fall precautions.  This is paramount for him and he admits that he is very conscious when he is walking and tries to have something to hold on to.  - He is doing a good job exercising at the gym and doing some weightbearing exercises.  I gave him a handout from [employer]: weight bearing exercises - advised to do this every day or at least 5/7 days - we discussed about maintaining a good amount of protein in his diet. The recommended daily protein intake is ~0.8 g per kilogram per day. I advised him to try to aim for this amount, since a diet low in proteins can exacerbate osteoporosis. Also, avoid smoking or >2 drinks of alcohol a day. - We discussed about the different medication classes, benefits and side effects (including atypical fractures and ONJ).  He did have what appears to be a typical fracture on Fosamax and what appears to be ONJ with Fosamax and also Prolia.  We discussed that we probably should not use these medication classes anymore for him.  The 2 other medication classes left are Teriparatide/Abaloparatide (which are daily subcutaneous medication - not FDA approved for men, though) or Romosozumab (subcutaneous monthly).  We did discuss about benefits and possible side effects of both to include osteosarcoma and increased risk of stroke, respectively.  He does not have a history of cardiovascular event or stroke in the last year to contraindicate Romosozumab.  He tells me that he does not  think he can give himself an injection every night.  I explained that this is easy while using a pen and he will be trained properly for this.  He could not make a decision about the above treatments today but I also gave him written information about these medicines and he will let me know about his decision. - Pt was given reading information about Forteo/Tymlos and also about Evenity, and I explained the mechanism of action and expected benefits.  - Will check a vitamin level today -If he decides for starting treatment with the PTH or PTHrp agonist, he will need a calcium, parathyroid hormone, IN the urine calcium before we start treatment - will check a new DXA now and probably 2 years after starting treatment - the first indication that the treatment is working is him not having anymore fractures. DEXA scan changes are secondary: unchanged or slightly higher T-scores are desirable - will see pt back in a year or sooner depending on his decision   Lumbar spine L1-L4 Femoral neck (FN) 33% distal radius  T-score -2.2 LFN: -3.0 n/a  Change in BMD from previous DXA test (%) Up 1.4% Down 8.0% n/a  (*) statistically significant  Bone density worsened at the level of the femoral neck.  I will await his decision about treatment.  Philemon Kingdom, MD PhD [facility] Endocrinology

## 2018-12-10 ENCOUNTER — Telehealth: Payer: Self-pay

## 2018-12-10 NOTE — Telephone Encounter (Signed)
Left detailed VM with results and requested he call back if he has any questions

## 2018-12-10 NOTE — Telephone Encounter (Signed)
-----   Message from Philemon Kingdom, MD sent at 12/09/2018  5:21 PM EST ----- Lenna Sciara, can you please call pt: Vitamin D is normal.

## 2018-12-11 ENCOUNTER — Ambulatory Visit: Payer: Self-pay | Admitting: *Deleted

## 2018-12-11 NOTE — Telephone Encounter (Signed)
Appt scheduled 12/13/2018.

## 2018-12-11 NOTE — Telephone Encounter (Signed)
Pt called because he has no energy. He feels like this started last Monday with him having a root canal. So he has not been eating or drinking a lot. He did have a headache on the right side of his face above the area where he had the root canal. He cleaned his nose really well and got up some phlegm. After that, he felt better. He is now eating better. (just left the cafeteria for lunch) He just wants to have his sinuses checked out and figure out why he is still fatigue. Appointment scheduled per protocol. Advised to call back for any increase in his symptoms. Pt voiced understanding. Routing to flow at [facility].  Reason for Disposition . [1] Fatigue (i.e., tires easily, decreased energy) AND [2] persists > 1 week  Answer Assessment - Initial Assessment Questions 1. DESCRIPTION: "Describe how you are feeling."     Feels like no energy 2. SEVERITY: "How bad is it?"  "Can you stand and walk?"   - MILD - Feels weak or tired, but does not interfere with work, school or normal activities   - [place] to stand and walk; weakness interferes with work, school, or normal activities   - SEVERE - Unable to stand or walk     mild 3. ONSET:  "When did the weakness begin?"     Started last Monday 4. CAUSE: "What do you think is causing the weakness?"     Not eating or drinking as well from the root canal 5. MEDICINES: "Have you recently started a new medicine or had a change in the amount of a medicine?"     no 6. OTHER SYMPTOMS: "Do you have any other symptoms?" (e.g., chest pain, fever, cough, SOB, vomiting, diarrhea, bleeding, other areas of pain)     Had chills last night, little bit of shortness of breath this morning, not now. 7. PREGNANCY: "Is there any chance you are pregnant?" "When was your last menstrual period?"     n/a  Protocols used: WEAKNESS (GENERALIZED) AND FATIGUE-A-AH

## 2018-12-13 ENCOUNTER — Encounter: Payer: Self-pay | Admitting: Internal Medicine

## 2018-12-13 ENCOUNTER — Ambulatory Visit (INDEPENDENT_AMBULATORY_CARE_PROVIDER_SITE_OTHER): Payer: Medicare Other | Admitting: Internal Medicine

## 2018-12-13 ENCOUNTER — Other Ambulatory Visit: Payer: Self-pay

## 2018-12-13 VITALS — BP 112/60 | HR 62 | Temp 98.0°F | Resp 17 | Ht 74.0 in | Wt 151.0 lb

## 2018-12-13 DIAGNOSIS — R5383 Other fatigue: Secondary | ICD-10-CM | POA: Diagnosis not present

## 2018-12-13 DIAGNOSIS — M272 Inflammatory conditions of jaws: Secondary | ICD-10-CM

## 2018-12-13 MED ORDER — AMOXICILLIN-POT CLAVULANATE 875-125 MG PO TABS: 1.0000 | ORAL_TABLET | Freq: Two times a day (BID) | ORAL | 0 refills | Status: DC

## 2018-12-13 NOTE — Progress Notes (Signed)
Subjective:    Patient ID: [name], male    DOB: 06-17-1930, 85 y.o.   MRN: [id number]  DOS:  12/13/2018 Type of visit - description: acute Was doing relatively well until 12/02/2018 when he went to his dentist had a root canal at the left upper denture. He was prescribed amoxicillin and tramadol but declined to take because previous experiences with those medications.  Reports that since the root canal he has developed a number of symptoms: Fatigue, dry mouth, chest and nasal congestion, occasionally light brown sputum and nasal discharge. Sometimes a headache, he points to the left maxillary area and also behind the left eye.  Wt Readings from Last 3 Encounters:  12/13/18 151 lb (68.5 kg)  12/09/18 149 lb (67.6 kg)  11/22/18 150 lb (68 kg)     Review of Systems Denies fevers, some chills. Myalgias?  He is uncertain, "may be some". No visual disturbances, no amaurosis fugax, no jaw claudication. Appetite is decreased but he is drinking fluids Denies chest pain, palpitations, edema. Mild shortness of breath?. PHQ 9 scored  #2.   Past Medical History:  Diagnosis Date  . Anxiety   . Atrial fibrillation ([place])   . Blood transfusion without reported diagnosis   . CAD    a. 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  . Cataract   . Coronary artery disease   . Diverticulosis 2011   Diverticulitis 2004  . Esophageal reflux   . Headache    saw neuro 4-16, MRI done  . HYPERPLASIA, PROSTATE NOS W/URINARY OBST/LUTS   . IBS   . INGUINAL HERNIA   . Lichen planus [id number]   [name]  . LUMBAR RADICULOPATHY, RIGHT   . Mixed hyperlipidemia   . OSTEOPOROSIS   . PONV (postoperative nausea and vomiting)   . PREMATURE VENTRICULAR CONTRACTIONS    a. After CABG - was on Coumadin/Multaq for a period of time. Coumadin discontinued 04/2010 after maintaining NSR.  [name] UNSPECIFIED ANEMIA     Past Surgical History:   Procedure Laterality Date  . BIOPSY  09/10/2018   Procedure: BIOPSY;  Surgeon: Jerene Bears, MD;  Location: [facility];  Service: Gastroenterology;;  . CATARACT EXTRACTION Right 10/06/2013  . CORONARY ARTERY BYPASS GRAFT     2 VD;anomalous RCA;post op compliacted by AF  . ESOPHAGOGASTRODUODENOSCOPY (EGD) WITH PROPOFOL N/A 09/10/2018   Procedure: ESOPHAGOGASTRODUODENOSCOPY (EGD) WITH PROPOFOL;  Surgeon: Jerene Bears, MD;  Location: [facility];  Service: Gastroenterology;  Laterality: N/A;  . HIP FRACTURE SURGERY Right 03/2007   Dr. Salvadore Farber  . INGUINAL HERNIA REPAIR Left 2012   Dr Brantley Stage  . KNEE SURGERY Right 1977   repair  . PROSTATE BIOPSY  09/19/2013   [facility]    Social History   Socioeconomic History  . Marital status: Widowed    Spouse name: Not on file  . Number of children: 0  . Years of education: Not on file  . Highest education level: Not on file  Occupational History  . Occupation: retired    Fish farm manager: RETIRED  Social Needs  . Financial resource strain: Not on file  . Food insecurity:    Worry: Not on file    Inability: Not on file  . Transportation needs:    Medical: Not on file    Non-medical: Not on file  Tobacco Use  . Smoking status: Former Smoker  Types: Cigarettes    Last attempt to quit: 11/27/1962    Years since quitting: 56.0  . Smokeless tobacco: Never Used  Substance and Sexual Activity  . Alcohol use: Not Currently    Alcohol/week: 0.0 standard drinks    Comment: quit drinking   . Drug use: No  . Sexual activity: Not Currently  Lifestyle  . Physical activity:    Days per week: Not on file    Minutes per session: Not on file  . Stress: Not on file  Relationships  . Social connections:    Talks on phone: Not on file    Gets together: Not on file    Attends religious service: Not on file    Active member of club or organization: Not on file    Attends meetings of clubs or organizations: Not on file    Relationship  status: Not on file  . Intimate partner violence:    Fear of current or ex partner: Not on file    Emotionally abused: Not on file    Physically abused: Not on file    Forced sexual activity: Not on file  Other Topics Concern  . Not on file  Social History Narrative   Lives at [place] in independent living.     Lost wife 06-2016.     Has no children.  Family: nephews-nices in [place]   Retired from [employer].     Still drives.       Allergies as of 12/13/2018      Reactions   Zoloft [sertraline Hcl]    Patient didn't like the way it made him feel   Remeron [mirtazapine] Other (See Comments)   Excessive Drowsiness      Medication List       Accurate as of December 13, 2018 11:59 PM. Always use your most recent med list.        amoxicillin-clavulanate 875-125 MG tablet Commonly known as:  AUGMENTIN Take 1 tablet by mouth 2 (two) times daily.   cholecalciferol 1000 units tablet Commonly known as:  VITAMIN D Take 1,000 Units by mouth every morning.   ELIQUIS 5 MG Tabs tablet Generic drug:  apixaban TAKE ONE TABLET BY MOUTH TWICE DAILY   folic acid 1 MG tablet Commonly known as:  FOLVITE TAKE ONE (1) TABLET BY MOUTH EACH DAY   loratadine 10 MG tablet Commonly known as:  CLARITIN Take 10 mg by mouth as needed.   multivitamin with minerals Tabs tablet Take 1 tablet by mouth daily.   nitroGLYCERIN 0.4 MG SL tablet Commonly known as:  NITROSTAT Place 1 tablet (0.4 mg total) under the tongue every 5 (five) minutes x 3 doses as needed for chest pain.   pantoprazole 40 MG tablet Commonly known as:  PROTONIX Take 1 tablet (40 mg total) by mouth daily.   rosuvastatin 10 MG tablet Commonly known as:  CRESTOR TAKE ONE (1) TABLET BY MOUTH EVERY DAY   TUMS PO Take 1 tablet by mouth daily as needed (heartburn). For calcium           Objective:   Physical Exam HENT:     Mouth/Throat:     BP 112/60   Pulse 62   Temp 98 F (36.7 C) (Oral)   Resp  17   Ht 6\' 2"  (1.88 m)   Wt 151 lb (68.5 kg)   SpO2 97%   BMI 19.39 kg/m  General:   Well developed, NAD, BMI noted. HEENT:  Normocephalic .  Face symmetric, atraumatic. Nose not congested, sinuses no TTP. Temporary arteries bilaterally: Good pulse, not tender. Lungs:  CTA B Normal respiratory effort, no intercostal retractions, no accessory muscle use. Heart: RRR,  no murmur.  No pretibial edema bilaterally  Skin: Not pale. Not jaundice Neurologic:  alert & oriented X3.  Speech normal, gait appropriate for age and unassisted Psych--  Cognition and judgment appear intact.  Cooperative with normal attention span and concentration.  Behavior appropriate. No anxious or depressed appearing.      Assessment     Assessment Prediabetes, A1c 6.0 (2015) Hyperlipidemia Anxiety, depression:  -intol to zoloft, Rx lexapro 5 mg 07-2015: intolerant d/t nausea  -lost wife July 2017 CV: ---CAD--CABG 2011 ----P- Atrial fibrillation, PVCs. --Multaq, Elliquis  ----Palpable Ao x years, [place] (-) for AAA 2005 COPD (per CXR 06-2015), PFTs 10-04-15 mild obstruction DJD  Gait-- uses a walker prn GI: GERD, IBS, diverticulosis GU: elevated PSA, BPH , (-) bx 2014 Osteoporosis: -hip FX 2008 - dexa ~2005 --> rx fosamax, took x 1 year, dexa ~2010 was rec no fosamax at that time. Dexa 09-2015: T score -2.4 --> rx fosamax, d/c d/t jaw pain 06-2017; prolia #25 February 599 Lichen planus Headache -- saw neurology 02-2015, had a MRI/MRA (-)  PLAN Odontogenic infection: The patient has tenderness and swelling by tooth # 13.  He was prescribed amoxicillin but he is reluctant to take, he is not truly allergic to it, in the remote past  developed a headache with penicillin. Plan: Augmentin 850 twice daily x7 days, watch s/e.  Pain management with Tylenol. Needs to see his dentist ASAP. ER if symptoms severe. Fatigue: Fatigue and other sx, unclear if related to above infection or something else.  PMR / temporary  arthritis always come to mind in a fatigued elderly gentleman but he lacks other classic symptoms, he actually looks well today no distress.  if he is not better after treatment with antibiotics will need further eval. He might have a concomitant URI with cough and nasal congestion.  Add Mucinex and Flonase. RTC few days if not better.

## 2018-12-13 NOTE — Patient Instructions (Signed)
See your dentist as soon as possible  If you have severe pain, fever, chills or other worrisome symptoms: Go to the ER  Start taking Augmentin twice a day  Tylenol as needed for pain  Mucinex DM as needed for cough  Flonase, a over-the-counter nasal spray: 2 sprays on each side of the nose twice a day  Call or come back next week to this office if you are not gradually improving

## 2018-12-14 NOTE — Assessment & Plan Note (Signed)
Odontogenic infection: The patient has tenderness and swelling by tooth # 13.  He was prescribed amoxicillin but he is reluctant to take, he is not truly allergic to it, in the remote past  developed a headache with penicillin. Plan: Augmentin 850 twice daily x7 days, watch s/e.  Pain management with Tylenol. Needs to see his dentist ASAP. ER if symptoms severe. Fatigue: Fatigue and other sx, unclear if related to above infection or something else.  PMR / temporary arthritis always come to mind in a fatigued elderly gentleman but he lacks other classic symptoms, he actually looks well today no distress.  if he is not better after treatment with antibiotics will need further eval. He might have a concomitant URI with cough and nasal congestion.  Add Mucinex and Flonase. RTC few days if not better.

## 2018-12-17 ENCOUNTER — Other Ambulatory Visit: Payer: Medicare Other

## 2018-12-19 ENCOUNTER — Encounter (HOSPITAL_COMMUNITY): Payer: Self-pay | Admitting: Emergency Medicine

## 2018-12-19 ENCOUNTER — Emergency Department (HOSPITAL_COMMUNITY): Payer: Medicare Other

## 2018-12-19 ENCOUNTER — Emergency Department (HOSPITAL_COMMUNITY)
Admission: EM | Admit: 2018-12-19 | Discharge: 2018-12-20 | Disposition: A | Discharge status: Home / Self Care | Payer: Medicare Other | Attending: Emergency Medicine | Admitting: Emergency Medicine

## 2018-12-19 DIAGNOSIS — Z87891 Personal history of nicotine dependence: Secondary | ICD-10-CM | POA: Insufficient documentation

## 2018-12-19 DIAGNOSIS — J449 Chronic obstructive pulmonary disease, unspecified: Secondary | ICD-10-CM | POA: Diagnosis not present

## 2018-12-19 DIAGNOSIS — Z79899 Other long term (current) drug therapy: Secondary | ICD-10-CM | POA: Insufficient documentation

## 2018-12-19 DIAGNOSIS — Z951 Presence of aortocoronary bypass graft: Secondary | ICD-10-CM | POA: Insufficient documentation

## 2018-12-19 DIAGNOSIS — I2510 Atherosclerotic heart disease of native coronary artery without angina pectoris: Secondary | ICD-10-CM | POA: Insufficient documentation

## 2018-12-19 DIAGNOSIS — R1084 Generalized abdominal pain: Secondary | ICD-10-CM | POA: Diagnosis not present

## 2018-12-19 DIAGNOSIS — I959 Hypotension, unspecified: Secondary | ICD-10-CM | POA: Diagnosis not present

## 2018-12-19 DIAGNOSIS — Z7901 Long term (current) use of anticoagulants: Secondary | ICD-10-CM | POA: Insufficient documentation

## 2018-12-19 DIAGNOSIS — I700 Atherosclerosis of aorta: Secondary | ICD-10-CM | POA: Diagnosis not present

## 2018-12-19 DIAGNOSIS — K5730 Diverticulosis of large intestine without perforation or abscess without bleeding: Secondary | ICD-10-CM | POA: Diagnosis not present

## 2018-12-19 DIAGNOSIS — R110 Nausea: Secondary | ICD-10-CM | POA: Insufficient documentation

## 2018-12-19 DIAGNOSIS — R531 Weakness: Secondary | ICD-10-CM | POA: Diagnosis not present

## 2018-12-19 DIAGNOSIS — I4510 Unspecified right bundle-branch block: Secondary | ICD-10-CM | POA: Diagnosis not present

## 2018-12-19 DIAGNOSIS — I491 Atrial premature depolarization: Secondary | ICD-10-CM | POA: Diagnosis not present

## 2018-12-19 HISTORY — DX: Gastro-esophageal reflux disease without esophagitis: K21.9

## 2018-12-19 HISTORY — DX: Nausea: R11.0

## 2018-12-19 HISTORY — DX: Epigastric pain: R10.13

## 2018-12-19 HISTORY — DX: Unspecified abdominal pain: R10.9

## 2018-12-19 HISTORY — DX: Other chronic pain: G89.29

## 2018-12-19 LAB — CBC WITH DIFFERENTIAL/PLATELET
Abs Immature Granulocytes: 0.03 10*3/uL (ref 0.00–0.07)
Basophils Absolute: 0.1 10*3/uL (ref 0.0–0.1)
Basophils Relative: 1 %
EOS PCT: 2 %
Eosinophils Absolute: 0.2 10*3/uL (ref 0.0–0.5)
HEMATOCRIT: 42.9 % (ref 39.0–52.0)
HEMOGLOBIN: 14.1 g/dL (ref 13.0–17.0)
Immature Granulocytes: 0 %
LYMPHS PCT: 18 %
Lymphs Abs: 1.6 10*3/uL (ref 0.7–4.0)
MCH: 30.5 pg (ref 26.0–34.0)
MCHC: 32.9 g/dL (ref 30.0–36.0)
MCV: 92.9 fL (ref 80.0–100.0)
MONO ABS: 0.8 10*3/uL (ref 0.1–1.0)
MONOS PCT: 9 %
Neutro Abs: 6.4 10*3/uL (ref 1.7–7.7)
Neutrophils Relative %: 70 %
Platelets: 254 10*3/uL (ref 150–400)
RBC: 4.62 MIL/uL (ref 4.22–5.81)
RDW: 15.2 % (ref 11.5–15.5)
WBC: 9.1 10*3/uL (ref 4.0–10.5)
nRBC: 0 % (ref 0.0–0.2)

## 2018-12-19 LAB — CBG MONITORING, ED: Glucose-Capillary: 75 mg/dL (ref 70–99)

## 2018-12-19 LAB — COMPREHENSIVE METABOLIC PANEL
ALBUMIN: 3.9 g/dL (ref 3.5–5.0)
ALK PHOS: 34 U/L — AB (ref 38–126)
ALT: 13 U/L (ref 0–44)
ANION GAP: 13 (ref 5–15)
AST: 25 U/L (ref 15–41)
BUN: 21 mg/dL (ref 8–23)
CALCIUM: 9 mg/dL (ref 8.9–10.3)
CO2: 21 mmol/L — ABNORMAL LOW (ref 22–32)
CREATININE: 0.91 mg/dL (ref 0.61–1.24)
Chloride: 96 mmol/L — ABNORMAL LOW (ref 98–111)
GFR calc Af Amer: >60 mL/min (ref >60)
GFR calc non Af Amer: >60 mL/min (ref >60)
GLUCOSE: 85 mg/dL (ref 70–99)
Potassium: 4.4 mmol/L (ref 3.5–5.1)
SODIUM: 130 mmol/L — AB (ref 135–145)
TOTAL PROTEIN: 6.9 g/dL (ref 6.5–8.1)
Total Bilirubin: 1 mg/dL (ref 0.3–1.2)

## 2018-12-19 LAB — TROPONIN I: Troponin I: <0.03 ng/mL (ref <0.03)

## 2018-12-19 LAB — LIPASE, BLOOD: Lipase: 36 U/L (ref 11–51)

## 2018-12-19 MED ORDER — FAMOTIDINE IN NACL 20-0.9 MG/50ML-% IV SOLN
20.0000 mg | Freq: Once | INTRAVENOUS | Status: AC
  Administered 2018-12-19: 20 mg via INTRAVENOUS
  Filled 2018-12-19: qty 50

## 2018-12-19 MED ORDER — SODIUM CHLORIDE 0.9% FLUSH: 3.0000 mL | Freq: Once | INTRAVENOUS | Status: DC

## 2018-12-19 MED ORDER — IOPAMIDOL (ISOVUE-300) INJECTION 61%
INTRAVENOUS | Status: AC
  Filled 2018-12-19: qty 100

## 2018-12-19 MED ORDER — IOPAMIDOL (ISOVUE-300) INJECTION 61%
100.0000 mL | Freq: Once | INTRAVENOUS | Status: AC | PRN
  Administered 2018-12-19: 100 mL via INTRAVENOUS

## 2018-12-19 MED ORDER — SODIUM CHLORIDE 0.9 % IV SOLN
INTRAVENOUS | Status: DC
  Administered 2018-12-19: 22:00:00 via INTRAVENOUS

## 2018-12-19 MED ORDER — SODIUM CHLORIDE (PF) 0.9 % IJ SOLN
INTRAMUSCULAR | Status: AC
  Filled 2018-12-19: qty 50

## 2018-12-19 NOTE — ED Notes (Signed)
Mr Sherrin stated he was unable to urinate. Mr Carchi refused in and out cath. Pt was asked 3X

## 2018-12-19 NOTE — ED Provider Notes (Signed)
[facility] DEPT Provider Note   CSN: [id number] Arrival date & time: 12/19/18  2029     History   Chief Complaint Chief Complaint  Patient presents with  . Weakness  . Nausea    HPI [name] is a 85 y.o. male.  HPI Pt was seen at 2055. Per pt and EMS report: c/o gradual onset and persistence of constant nausea for the past 5 months. Has been associated with generalized weakness. Pt states his nausea worsened after he started to take augmentin for a "sinus infection" as dx by his PMD last week. Pt states he has had constant generalized abd "pains" for the past week. Denies vomiting/diarrhea, no back pain, no fevers, no sore throat, no rash, no CP/palpitations, no SOB/cough, no black or blood in stools.    Past Medical History:  Diagnosis Date  . Anxiety   . Atrial fibrillation ([place])   . Blood transfusion without reported diagnosis   . CAD    a. 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  . Cataract   . Chronic abdominal pain   . Chronic nausea   . Coronary artery disease   . Diverticulosis 2011   Diverticulitis 2004  . Dyspepsia   . Esophageal reflux   . GERD (gastroesophageal reflux disease)   . Headache    saw neuro 4-16, MRI done  . HYPERPLASIA, PROSTATE NOS W/URINARY OBST/LUTS   . IBS   . INGUINAL HERNIA   . Lichen planus [id number]   [name]  . LUMBAR RADICULOPATHY, RIGHT   . Mixed hyperlipidemia   . OSTEOPOROSIS   . PONV (postoperative nausea and vomiting)   . PREMATURE VENTRICULAR CONTRACTIONS    a. After CABG - was on Coumadin/Multaq for a period of time. Coumadin discontinued 04/2010 after maintaining NSR.  [name] UNSPECIFIED ANEMIA     Patient Active Problem List   Diagnosis Date Noted  . Abdominal pain, epigastric   . Anxiety and depression 06/22/2016  . COPD (chronic obstructive pulmonary disease) ([place]) 03/25/2016  . Annual physical exam  03/24/2016  . PCP NOTES >>>>>>>>>>>>>> 08/27/2015  . Allergic rhinitis 03/09/2015  . Headache 02/23/2015  . Bradycardia 06/15/2014  . Arthritis, degenerative 06/15/2014  . Hyperglycemia 01/08/2014  . Paroxysmal A-fib ([place]) 12/28/2013  . Diverticulosis of colon without hemorrhage 10/13/2013  . Mixed hyperlipidemia 04/18/2010  . Coronary atherosclerosis 02/25/2010  . PREMATURE VENTRICULAR CONTRACTIONS 02/08/2010  . HIP FRACTURE, RIGHT 12/13/2009  . IBS 09/01/2009  . Weakness 06/28/2009  . LUMBAR RADICULOPATHY, RIGHT 06/09/2009  . Esophageal reflux 09/22/2008  . PALPITATIONS 05/11/2008  . HYPERPLASIA, PROSTATE NOS W/URINARY OBST/LUTS 09/20/2007  . Closed fracture of thoracic vertebra ([place]) 09/20/2007  . Osteoporosis 01/01/2007    Past Surgical History:  Procedure Laterality Date  . BIOPSY  09/10/2018   Procedure: BIOPSY;  Surgeon: Jerene Bears, MD;  Location: [facility];  Service: Gastroenterology;;  . CATARACT EXTRACTION Right 10/06/2013  . CORONARY ARTERY BYPASS GRAFT     2 VD;anomalous RCA;post op compliacted by AF  . ESOPHAGOGASTRODUODENOSCOPY (EGD) WITH PROPOFOL N/A 09/10/2018   Procedure: ESOPHAGOGASTRODUODENOSCOPY (EGD) WITH PROPOFOL;  Surgeon: Jerene Bears, MD;  Location: [facility];  Service: Gastroenterology;  Laterality: N/A;  . HIP FRACTURE SURGERY Right 03/2007   Dr. Salvadore Farber  . INGUINAL HERNIA REPAIR Left 2012   Dr Brantley Stage  . KNEE SURGERY Right 1977   repair  . PROSTATE BIOPSY  09/19/2013   [facility]        Home Medications    Prior to Admission medications   Medication Sig Start Date End Date Taking? Authorizing Provider  amoxicillin-clavulanate (AUGMENTIN) 875-125 MG tablet Take 1 tablet by mouth 2 (two) times daily. 12/13/18   Colon Branch, MD  Calcium Carbonate Antacid (TUMS PO) Take 1 tablet by mouth daily as needed (heartburn). For calcium     [provider]  cholecalciferol (VITAMIN D) 1000 UNITS tablet Take 1,000 Units by  mouth every morning.      [provider]  ELIQUIS 5 MG TABS tablet TAKE ONE TABLET BY MOUTH TWICE DAILY Patient taking differently: Take 5 mg by mouth 2 (two) times daily.  09/02/18   Josue Hector, MD  folic acid (FOLVITE) 1 MG tablet TAKE ONE (1) TABLET BY MOUTH EACH DAY 09/30/18   Josue Hector, MD  loratadine (CLARITIN) 10 MG tablet Take 10 mg by mouth as needed.     [provider]  Multiple Vitamin (MULITIVITAMIN WITH MINERALS) TABS Take 1 tablet by mouth daily.      [provider]  nitroGLYCERIN (NITROSTAT) 0.4 MG SL tablet Place 1 tablet (0.4 mg total) under the tongue every 5 (five) minutes x 3 doses as needed for chest pain. 08/21/17   Colon Branch, MD  pantoprazole (PROTONIX) 40 MG tablet Take 1 tablet (40 mg total) by mouth daily. 08/14/18   Debbrah Alar, NP  rosuvastatin (CRESTOR) 10 MG tablet TAKE ONE (1) TABLET BY MOUTH EVERY DAY 09/30/18   Josue Hector, MD    Family History Family History  Problem Relation Age of Onset  . Stroke Mother   . Pancreatic cancer Father        Deceased, 9  . Breast cancer Sister        Deceased  . Heart disease Sister 41  . Stroke Maternal Aunt   . Colon cancer Neg Hx   . Prostate cancer Neg Hx   . Esophageal cancer Neg Hx   . Rectal cancer Neg Hx   . Stomach cancer Neg Hx     Social History Social History   Tobacco Use  . Smoking status: Former Smoker    Types: Cigarettes    Last attempt to quit: 11/27/1962    Years since quitting: 56.0  . Smokeless tobacco: Never Used  Substance Use Topics  . Alcohol use: Not Currently    Alcohol/week: 0.0 standard drinks    Comment: quit drinking   . Drug use: No     Allergies   Zoloft [sertraline hcl] and Remeron [mirtazapine]   Review of Systems Review of Systems ROS: Statement: All systems negative except as marked or noted in the HPI; Constitutional: Negative for fever and chills. +generalized weakness/fatigue. ; ; Eyes: Negative for eye pain,  redness and discharge. ; ; ENMT: Negative for ear pain, hoarseness, nasal congestion, sinus pressure and sore throat. ; ; Cardiovascular: Negative for chest pain, palpitations, diaphoresis, dyspnea and peripheral edema. ; ; Respiratory: Negative for cough, wheezing and stridor. ; ; Gastrointestinal: +nausea, abd pain. Negative for vomiting, diarrhea, blood in stool, hematemesis, jaundice and rectal bleeding. . ; ; Genitourinary: Negative for dysuria, flank pain and hematuria. ; ; Musculoskeletal: Negative for back pain and neck pain. Negative for swelling and trauma.; ; Skin: Negative for pruritus, rash, abrasions, blisters, bruising and skin lesion.; ; Neuro: Negative for headache, lightheadedness and neck stiffness. Negative for altered level of consciousness, altered  mental status, extremity weakness, paresthesias, involuntary movement, seizure and syncope.       Physical Exam Updated Vital Signs BP 116/65 (BP Location: Left Arm)   Pulse 72   Temp 98.4 F (36.9 C) (Oral)   Resp 19   SpO2 100%   Physical Exam 2100: Physical examination:  Nursing notes reviewed; Vital signs and O2 SAT reviewed;  Constitutional: Well developed, Well nourished, Well hydrated, In no acute distress; Head:  Normocephalic, atraumatic; Eyes: EOMI, PERRL, No scleral icterus; ENMT: Mouth and pharynx normal, Mucous membranes moist; Neck: Supple, Full range of motion, No lymphadenopathy; Cardiovascular: Regular rate and rhythm, No gallop; Respiratory: Breath sounds clear & equal bilaterally, No wheezes.  Speaking full sentences with ease, Normal respiratory effort/excursion; Chest: Nontender, Movement normal; Abdomen: Soft, Nontender, Nondistended, Normal bowel sounds; Genitourinary: No CVA tenderness; Extremities: Peripheral pulses normal, No tenderness, No edema, No calf edema or asymmetry.; Neuro: AA&Ox3, Major CN grossly intact.  Speech clear. No gross focal motor or sensory deficits in extremities.; Skin: Color normal,  Warm, Dry.   ED Treatments / Results  Labs (all labs ordered are listed, but only abnormal results are displayed)   EKG EKG Interpretation  Date/Time:  Thursday December 19 2018 20:51:10 EST Ventricular Rate:  71 PR Interval:    QRS Duration: 111 QT Interval:  389 QTC Calculation: 423 R Axis:   58 Text Interpretation:  Sinus rhythm Abnormal R-wave progression, early transition When compared with ECG of 08/16/2018 No significant change was found Confirmed by Francine Graven [phone]) on 12/19/2018 9:19:21 PM   Radiology   Procedures Procedures (including critical care time)  Medications Ordered in ED Medications  sodium chloride flush (NS) 0.9 % injection 3 mL (has no administration in time range)  0.9 %  sodium chloride infusion (has no administration in time range)  famotidine (PEPCID) IVPB 20 mg premix (has no administration in time range)     Initial Impression / Assessment and Plan / ED Course  I have reviewed the triage vital signs and the nursing notes.  Pertinent labs & imaging results that were available during my care of the patient were reviewed by me and considered in my medical decision making (see chart for details).  MDM Reviewed: previous chart, nursing note and vitals Reviewed previous: labs and ECG Interpretation: labs, ECG, x-ray and CT scan   Results for orders placed or performed during the hospital encounter of 12/19/18  Comprehensive metabolic panel  Result Value Ref Range   Sodium 130 (L) 135 - 145 mmol/L   Potassium 4.4 3.5 - 5.1 mmol/L   Chloride 96 (L) 98 - 111 mmol/L   CO2 21 (L) 22 - 32 mmol/L   Glucose, Bld 85 70 - 99 mg/dL   BUN 21 8 - 23 mg/dL   Creatinine, Ser 0.91 0.61 - 1.24 mg/dL   Calcium 9.0 8.9 - 10.3 mg/dL   Total Protein 6.9 6.5 - 8.1 g/dL   Albumin 3.9 3.5 - 5.0 g/dL   AST 25 15 - 41 U/L   ALT 13 0 - 44 U/L   Alkaline Phosphatase 34 (L) 38 - 126 U/L   Total Bilirubin 1.0 0.3 - 1.2 mg/dL   GFR calc non Af Amer >60 >60  mL/min   GFR calc Af Amer >60 >60 mL/min   Anion gap 13 5 - 15  Lipase, blood  Result Value Ref Range   Lipase 36 11 - 51 U/L  Troponin I - Once  Result Value Ref Range  Troponin I <0.03 <0.03 ng/mL  CBC with Differential  Result Value Ref Range   WBC 9.1 4.0 - 10.5 K/uL   RBC 4.62 4.22 - 5.81 MIL/uL   Hemoglobin 14.1 13.0 - 17.0 g/dL   HCT 42.9 39.0 - 52.0 %   MCV 92.9 80.0 - 100.0 fL   MCH 30.5 26.0 - 34.0 pg   MCHC 32.9 30.0 - 36.0 g/dL   RDW 15.2 11.5 - 15.5 %   Platelets 254 150 - 400 K/uL   nRBC 0.0 0.0 - 0.2 %   Neutrophils Relative % 70 %   Neutro Abs 6.4 1.7 - 7.7 K/uL   Lymphocytes Relative 18 %   Lymphs Abs 1.6 0.7 - 4.0 K/uL   Monocytes Relative 9 %   Monocytes Absolute 0.8 0.1 - 1.0 K/uL   Eosinophils Relative 2 %   Eosinophils Absolute 0.2 0.0 - 0.5 K/uL   Basophils Relative 1 %   Basophils Absolute 0.1 0.0 - 0.1 K/uL   Immature Granulocytes 0 %   Abs Immature Granulocytes 0.03 0.00 - 0.07 K/uL  CBG monitoring, ED  Result Value Ref Range   Glucose-Capillary 75 70 - 99 mg/dL   Comment 1 Notify RN    Comment 2 Document in Chart    Dg Chest 2 View Result Date: 12/19/2018 CLINICAL DATA:  Weakness and nausea for a week EXAM: CHEST - 2 VIEW COMPARISON:  09/20/2018 CXR, 08/16/2018 chest CT FINDINGS: The patient is status post CABG. Heart size and mediastinal contours are stable with aortic atherosclerosis again noted. Emphysematous hyperinflation of the lungs with chronic interstitial prominence is noted. Scarring is identified at the apices. Left upper lobe nodular opacities compatible with areas of previously described atelectasis and/or nodular scarring are redemonstrated. No effusion or pulmonary edema. Osteoarthritis of the [facility] and glenohumeral joints. Degenerative changes are present along the dorsal spine without acute osseous appearing abnormality. Chronic moderate T8 compression deformity is stable with lesser degrees of height loss at T9 and T10, also stable  in appearance. IMPRESSION: 1. No active cardiopulmonary disease. 2. Emphysematous hyperinflation of the lungs with chronic interstitial prominence is noted. Electronically Signed   By: Ashley Royalty M.D.   On: 12/19/2018 21:39   Ct Abdomen Pelvis W Contrast Result Date: 12/19/2018 CLINICAL DATA:  Nausea and vomiting for 3 days. History of colonic diverticulosis. EXAM: CT ABDOMEN AND PELVIS WITH CONTRAST TECHNIQUE: Multidetector CT imaging of the abdomen and pelvis was performed using the standard protocol following bolus administration of intravenous contrast. CONTRAST:  135mL ISOVUE-300 IOPAMIDOL (ISOVUE-300) INJECTION 61% COMPARISON:  CT abdomen and pelvis August 16, 2018. FINDINGS: LOWER CHEST: Chronic interstitial changes LEFT lung base scarring. Status post median sternotomy. Included heart size is normal. No pericardial effusion. HEPATOBILIARY: Calcified splenic granuloma, otherwise unremarkable liver. Known hemangioma not apparent, given changes in bolus timing. Normal gallbladder. PANCREAS: Nonacute.  Atrophic. SPLEEN: Nonacute. Subcentimeter cyst versus lymphangioma and spleen. ADRENALS/URINARY TRACT: Kidneys are orthotopic, demonstrating symmetric enhancement. No nephrolithiasis, hydronephrosis or solid renal masses. The unopacified ureters are normal in course and caliber. Delayed imaging through the kidneys demonstrates symmetric prompt contrast excretion within the proximal urinary collecting system. Urinary bladder is partially distended and unremarkable. Normal adrenal glands. STOMACH/BOWEL: The stomach, small and large bowel are normal in course and caliber without inflammatory changes, sensitivity decreased without oral contrast. Moderate sigmoid colonic diverticulosis. VASCULAR/LYMPHATIC: Aortoiliac vessels are normal in course and caliber. Mild calcific atherosclerosis. No lymphadenopathy by CT size criteria. REPRODUCTIVE: Prostatomegaly, 6.8 cm in transaxial dimension  invading base of  bladder. OTHER: No intraperitoneal free fluid or free air. MUSCULOSKELETAL: Nonacute. RIGHT femur ORIF. Osteopenia. Sacroiliac ankylosis. Stable compression fractures of all lumbar levels. IMPRESSION: 1. Colonic diverticulosis without acute diverticulitis nor acute intra-abdominal/pelvic process. Aortic Atherosclerosis (ICD10-I70.0). Electronically Signed   By: Elon Alas M.D.   On: 12/19/2018 22:57     2355:  Orthostatic VS, UA/UC, and PO challenge pending. Longstanding hx nausea; has had GI MD evaluation for same. No clear indication for admission at this time. Sign out to Dr. Leonette Monarch.     Final Clinical Impressions(s) / ED Diagnoses   Final diagnoses:  None    ED Discharge Orders    None       Francine Graven, DO 12/20/18 0001

## 2018-12-19 NOTE — ED Triage Notes (Signed)
Pt comes to ed via ems, c/o chronic nausea and stop taking his prescription antibiotic 2 days ago. Independent living facility [facility]   Pt states nausea is present since august. Pt also c/o of generalized weakness for a week now. Vs 118/74, pluse 62 rr16, spo2 97 room air , cbg 109. Alert x4, 20 LFA 4 mg zofran, with marked improvement.  Walks with walker. Sinus rhythm with a hx of AFIB. Had cabbage in 2001.

## 2018-12-20 DIAGNOSIS — R5381 Other malaise: Secondary | ICD-10-CM | POA: Diagnosis not present

## 2018-12-20 DIAGNOSIS — Z743 Need for continuous supervision: Secondary | ICD-10-CM | POA: Diagnosis not present

## 2018-12-20 DIAGNOSIS — R279 Unspecified lack of coordination: Secondary | ICD-10-CM | POA: Diagnosis not present

## 2018-12-20 LAB — URINALYSIS, ROUTINE W REFLEX MICROSCOPIC
BACTERIA UA: NONE SEEN
BILIRUBIN URINE: NEGATIVE
Glucose, UA: NEGATIVE mg/dL
KETONES UR: 80 mg/dL — AB
LEUKOCYTES UA: NEGATIVE
Nitrite: NEGATIVE
PH: 5 (ref 5.0–8.0)
PROTEIN: NEGATIVE mg/dL
Specific Gravity, Urine: >1.046 — ABNORMAL HIGH (ref 1.005–1.030)

## 2018-12-20 MED ORDER — FAMOTIDINE 40 MG PO TABS: 40.0000 mg | ORAL_TABLET | Freq: Every day | ORAL | 0 refills | Status: DC

## 2018-12-20 MED ORDER — SODIUM CHLORIDE 0.9 % IV BOLUS
500.0000 mL | Freq: Once | INTRAVENOUS | Status: AC
  Administered 2018-12-20: 500 mL via INTRAVENOUS

## 2018-12-20 NOTE — ED Notes (Signed)
Po challenged

## 2018-12-20 NOTE — ED Provider Notes (Signed)
I assumed care of this patient from Dr. Thurnell Garbe at 0000.  Please see their note for further details of Hx, PE.  Briefly patient is a 85 y.o. male who presented with chronic nausea and generalized fatigue.  Work-up has been reassuring thus far with labs and imaging.  Currently pending urine and p.o. challenge.   UA negative for infectious process.  Patient was able to tolerate oral intake.  Discussed having case management come and speak to him about home health and he stated that at his independent living facility there is a staff person that handles that for the residents.  The patient appears reasonably screened and/or stabilized for discharge and I doubt any other medical condition or other [facility] requiring further screening, evaluation, or treatment in the ED at this time prior to discharge.  The patient is safe for discharge with strict return precautions.    Fatima Blank, MD 12/20/18 [phone]

## 2018-12-20 NOTE — ED Notes (Signed)
Paper work complete  ptar called

## 2018-12-21 DIAGNOSIS — M810 Age-related osteoporosis without current pathological fracture: Secondary | ICD-10-CM | POA: Diagnosis not present

## 2018-12-21 DIAGNOSIS — R2681 Unsteadiness on feet: Secondary | ICD-10-CM | POA: Diagnosis not present

## 2018-12-21 DIAGNOSIS — R531 Weakness: Secondary | ICD-10-CM | POA: Diagnosis not present

## 2018-12-21 DIAGNOSIS — M6281 Muscle weakness (generalized): Secondary | ICD-10-CM | POA: Diagnosis not present

## 2018-12-21 DIAGNOSIS — R110 Nausea: Secondary | ICD-10-CM | POA: Diagnosis not present

## 2018-12-21 LAB — URINE CULTURE: Culture: <10000 — AB

## 2018-12-24 DIAGNOSIS — I4891 Unspecified atrial fibrillation: Secondary | ICD-10-CM | POA: Diagnosis not present

## 2018-12-24 DIAGNOSIS — M6281 Muscle weakness (generalized): Secondary | ICD-10-CM | POA: Diagnosis not present

## 2018-12-24 DIAGNOSIS — R112 Nausea with vomiting, unspecified: Secondary | ICD-10-CM | POA: Diagnosis not present

## 2018-12-24 DIAGNOSIS — I2510 Atherosclerotic heart disease of native coronary artery without angina pectoris: Secondary | ICD-10-CM | POA: Diagnosis not present

## 2018-12-25 ENCOUNTER — Encounter: Payer: Self-pay | Admitting: Gastroenterology

## 2018-12-25 ENCOUNTER — Ambulatory Visit: Payer: Medicare Other | Admitting: Gastroenterology

## 2018-12-25 ENCOUNTER — Ambulatory Visit (INDEPENDENT_AMBULATORY_CARE_PROVIDER_SITE_OTHER): Payer: Medicare Other | Admitting: Gastroenterology

## 2018-12-25 VITALS — BP 92/58 | HR 60 | Ht 74.0 in | Wt 144.2 lb

## 2018-12-25 DIAGNOSIS — R634 Abnormal weight loss: Secondary | ICD-10-CM

## 2018-12-25 DIAGNOSIS — K219 Gastro-esophageal reflux disease without esophagitis: Secondary | ICD-10-CM | POA: Diagnosis not present

## 2018-12-25 DIAGNOSIS — R1013 Epigastric pain: Secondary | ICD-10-CM | POA: Diagnosis not present

## 2018-12-25 DIAGNOSIS — R110 Nausea: Secondary | ICD-10-CM

## 2018-12-25 MED ORDER — PANTOPRAZOLE SODIUM 40 MG PO TBEC: DELAYED_RELEASE_TABLET | ORAL | 0 refills | Status: DC

## 2018-12-25 NOTE — Progress Notes (Signed)
Chief Complaint: Chronic nausea  Referring Provider:  Colon Branch, MD      ASSESSMENT AND PLAN;   #1. Chronic nausea with dyspepsia -likely d/t meds-Multaq.  Much better since it has been stopped.  Neg EGD 08/2018, Neg [place] 08/2018, neg CT abdo/pelvis 12/19/2018 08/16/2018; normal CBC, CMP, TSH and PSA 2019.  #2. Weight Loss - neg EGD, CT 08/2018, neg colon in past.   #3.  GERD with small HH.  #4. Multiple comorbid conditions including A. fib, COPD, CAD, anxiety and advanced age.  Plan: -Increase Protonix 40mg  po bid x 2 weeks, then QD -Continue to monitor weight. -Solid-phase gastric emptying scan in 4 weeks. -FU in 12 weeks.  Earlier, if still with problems.    HPI:    [name] is a 85 y.o. male  1 episode of nauesa with vomiting- mostly phelem, seen in the emergency room on 12/19/2018-had negative CT scan of the abdomen and pelvis with contrast- except for colonic diverticulosis.  Normal labs.  Overall better since stopping Multaq.  Most recently had an episode of sinusitis requiring amoxicillin which he could not tolerate.  Protonix has definitely helped.  No heartburn.  Has gained weight.  Has been eating well overall.  Neg CT chest abdomen and pelvis, EGD with biopsies, [place] and chest x-ray  Denies having any dizziness or vertigo. Denies any any significant diarrhea or constipation.  No abdominal pain. Zofran did help.  He would like to use it on as-needed basis.  Past Medical History:  Diagnosis Date  . Anxiety   . Atrial fibrillation ([place])   . Blood transfusion without reported diagnosis   . CAD    a. 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  . Cataract   . Chronic abdominal pain   . Chronic nausea   . Coronary artery disease   . Diverticulosis 2011   Diverticulitis 2004  . Dyspepsia   . Esophageal reflux   . GERD (gastroesophageal reflux disease)   . Headache    saw neuro  4-16, MRI done  . HYPERPLASIA, PROSTATE NOS W/URINARY OBST/LUTS   . IBS   . INGUINAL HERNIA   . Lichen planus [id number]   [name]  . LUMBAR RADICULOPATHY, RIGHT   . Mixed hyperlipidemia   . OSTEOPOROSIS   . PONV (postoperative nausea and vomiting)   . PREMATURE VENTRICULAR CONTRACTIONS    a. After CABG - was on Coumadin/Multaq for a period of time. Coumadin discontinued 04/2010 after maintaining NSR.  [name] UNSPECIFIED ANEMIA     Past Surgical History:  Procedure Laterality Date  . BIOPSY  09/10/2018   Procedure: BIOPSY;  Surgeon: Jerene Bears, MD;  Location: [facility];  Service: Gastroenterology;;  . CATARACT EXTRACTION Right 10/06/2013  . CORONARY ARTERY BYPASS GRAFT     2 VD;anomalous RCA;post op compliacted by AF  . ESOPHAGOGASTRODUODENOSCOPY (EGD) WITH PROPOFOL N/A 09/10/2018   Procedure: ESOPHAGOGASTRODUODENOSCOPY (EGD) WITH PROPOFOL;  Surgeon: Jerene Bears, MD;  Location: [facility];  Service: Gastroenterology;  Laterality: N/A;  . HIP FRACTURE SURGERY Right 03/2007   Dr. Salvadore Farber  . INGUINAL HERNIA REPAIR Left 2012   Dr Brantley Stage  . KNEE SURGERY Right 1977   repair  . PROSTATE BIOPSY  09/19/2013   [facility]    Family History  Problem Relation Age of Onset  . Stroke Mother   . Pancreatic cancer Father  Deceased, 26  . Breast cancer Sister        Deceased  . Heart disease Sister 65  . Stroke Maternal Aunt   . Colon cancer Neg Hx   . Prostate cancer Neg Hx   . Esophageal cancer Neg Hx   . Rectal cancer Neg Hx   . Stomach cancer Neg Hx     Social History   Tobacco Use  . Smoking status: Former Smoker    Types: Cigarettes    Last attempt to quit: 11/27/1962    Years since quitting: 56.1  . Smokeless tobacco: Never Used  Substance Use Topics  . Alcohol use: Not Currently    Alcohol/week: 0.0 standard drinks    Comment: quit drinking   . Drug use: No    Current Outpatient Medications  Medication Sig Dispense Refill  . acetaminophen  (TYLENOL) 500 MG tablet Take 500 mg by mouth every 6 (six) hours as needed for mild pain.    . Calcium Carbonate Antacid (TUMS PO) Take 1 tablet by mouth as needed (heartburn). For calcium     . cholecalciferol (VITAMIN D) 1000 UNITS tablet Take 1,000 Units by mouth every morning.      [name] ELIQUIS 5 MG TABS tablet TAKE ONE TABLET BY MOUTH TWICE DAILY (Patient taking differently: Take 5 mg by mouth 2 (two) times daily. ) 60 tablet 5  . folic acid (FOLVITE) 1 MG tablet TAKE ONE (1) TABLET BY MOUTH EACH DAY (Patient taking differently: Take 1 mg by mouth daily. ) 30 tablet 10  . Multiple Vitamin (MULITIVITAMIN WITH MINERALS) TABS Take 1 tablet by mouth daily.      . nitroGLYCERIN (NITROSTAT) 0.4 MG SL tablet Place 1 tablet (0.4 mg total) under the tongue every 5 (five) minutes x 3 doses as needed for chest pain. 30 tablet 2  . pantoprazole (PROTONIX) 40 MG tablet Take 1 tablet (40 mg total) by mouth daily. (Patient taking differently: Take 40 mg by mouth as needed. ) 30 tablet 3  . rosuvastatin (CRESTOR) 10 MG tablet TAKE ONE (1) TABLET BY MOUTH EVERY DAY (Patient taking differently: Take 10 mg by mouth daily. ) 90 tablet 2  . famotidine (PEPCID) 40 MG tablet Take 1 tablet (40 mg total) by mouth daily for 30 days. (Patient not taking: Reported on 12/25/2018) 30 tablet 0   No current facility-administered medications for this visit.     Allergies  Allergen Reactions  . Amoxicillin Other (See Comments)    REACTION: n/v/d  . Zoloft [Sertraline Hcl]     Patient didn't like the way it made him feel  . Remeron [Mirtazapine] Other (See Comments)    Excessive Drowsiness    Review of Systems:  Neg except for HPI     Physical Exam:    BP (!) 92/58   Pulse 60   Ht 6\' 2"  (1.88 m)   Wt 144 lb 4 oz (65.4 kg)   BMI 18.52 kg/m  Filed Weights   12/25/18 1137  Weight: 144 lb 4 oz (65.4 kg)   Constitutional:  Well-developed, in no acute distress. Psychiatric: Normal mood and affect. Behavior is  normal. HEENT: Pupils normal.  Conjunctivae are normal. No scleral icterus. Neck supple.  Cardiovascular: Normal rate, regular rhythm. No edema Pulmonary/chest: Effort normal and breath sounds normal. No wheezing, rales or rhonchi. Abdominal: Soft, nondistended. Nontender. Bowel sounds active throughout. There are no masses palpable. No hepatomegaly. Rectal:  defered Neurological: Alert and oriented to person place and time. Skin:  Skin is warm and dry. No rashes noted.  Data Reviewed: I have personally reviewed following labs and imaging studies  CBC: CBC Latest Ref Rng & Units 12/19/2018 09/20/2018 09/11/2018  WBC 4.0 - 10.5 K/uL 9.1 10.3 5.3  Hemoglobin 13.0 - 17.0 g/dL 14.1 13.1 12.9(L)  Hematocrit 39.0 - 52.0 % 42.9 39.5 39.0  Platelets 150 - 400 K/uL 254 341.0 185    CMP: CMP Latest Ref Rng & Units 12/19/2018 09/09/2018 09/08/2018  Glucose 70 - 99 mg/dL 85 110(H) 97  BUN 8 - 23 mg/dL 21 10 11   Creatinine 0.61 - 1.24 mg/dL 0.91 0.83 0.88  Sodium 135 - 145 mmol/L 130(L) 136 135  Potassium 3.5 - 5.1 mmol/L 4.4 4.4 3.8  Chloride 98 - 111 mmol/L 96(L) 100 95(L)  CO2 22 - 32 mmol/L 21(L) 25 26  Calcium 8.9 - 10.3 mg/dL 9.0 8.8(L) 9.2  Total Protein 6.5 - 8.1 g/dL 6.9 6.4(L) 7.0  Total Bilirubin 0.3 - 1.2 mg/dL 1.0 0.3 0.6  Alkaline Phos 38 - 126 U/L 34(L) 39 41  AST 15 - 41 U/L 25 22 28   ALT 0 - 44 U/L 13 15 19       Carmell Austria, MD 12/25/2018, 12:01 PM  Cc: Colon Branch, MD

## 2018-12-25 NOTE — Patient Instructions (Signed)
If you are age 83 or older, your body mass index should be between 23-30. Your Body mass index is 18.52 kg/m. If this is out of the aforementioned range listed, please consider follow up with your Primary Care Provider.  If you are age 76 or younger, your body mass index should be between 19-25. Your Body mass index is 18.52 kg/m. If this is out of the aformentioned range listed, please consider follow up with your Primary Care Provider.   You have been scheduled for a gastric emptying scan at [facility] on 01/06/19 at 7:30am. Please arrive at least 15 minutes prior to your appointment for registration. Please make certain not to have anything to eat or drink after midnight the night before your test. Hold all stomach medications (ex: Zofran, phenergan, Reglan) 48 hours prior to your test. If you need to reschedule your appointment, please contact radiology scheduling at [phone]. _____________________________________________________________________ A gastric-emptying study measures how long it takes for food to move through your stomach. There are several ways to measure stomach emptying. In the most common test, you eat food that contains a small amount of radioactive material. A scanner that detects the movement of the radioactive material is placed over your abdomen to monitor the rate at which food leaves your stomach. This test normally takes about 4 hours to complete. _____________________________________________________________________   We have sent the following medications to your pharmacy for you to pick up at your convenience: Protonix twice daily x 2 weeks and then once daily.   Thank you,  Dr. Jackquline Denmark

## 2018-12-26 DIAGNOSIS — K219 Gastro-esophageal reflux disease without esophagitis: Secondary | ICD-10-CM | POA: Diagnosis not present

## 2018-12-26 DIAGNOSIS — M6281 Muscle weakness (generalized): Secondary | ICD-10-CM | POA: Diagnosis not present

## 2018-12-26 DIAGNOSIS — I2510 Atherosclerotic heart disease of native coronary artery without angina pectoris: Secondary | ICD-10-CM | POA: Diagnosis not present

## 2018-12-26 DIAGNOSIS — R112 Nausea with vomiting, unspecified: Secondary | ICD-10-CM | POA: Diagnosis not present

## 2019-01-06 ENCOUNTER — Ambulatory Visit (HOSPITAL_COMMUNITY)
Admission: RE | Admit: 2019-01-06 | Discharge: 2019-01-06 | Disposition: A | Discharge status: Home / Self Care | Payer: Medicare Other | Source: Ambulatory Visit | Attending: Gastroenterology | Admitting: Gastroenterology

## 2019-01-06 DIAGNOSIS — K219 Gastro-esophageal reflux disease without esophagitis: Secondary | ICD-10-CM | POA: Diagnosis not present

## 2019-01-06 DIAGNOSIS — R110 Nausea: Secondary | ICD-10-CM | POA: Insufficient documentation

## 2019-01-06 DIAGNOSIS — R634 Abnormal weight loss: Secondary | ICD-10-CM

## 2019-01-06 DIAGNOSIS — R1013 Epigastric pain: Secondary | ICD-10-CM | POA: Diagnosis not present

## 2019-01-06 MED ORDER — TECHNETIUM TC 99M SULFUR COLLOID
1.9500 | Freq: Once | INTRAVENOUS | Status: AC | PRN
  Administered 2019-01-06: 1.95 via INTRAVENOUS

## 2019-01-08 ENCOUNTER — Telehealth: Payer: Self-pay | Admitting: Gastroenterology

## 2019-01-08 NOTE — Telephone Encounter (Signed)
Left message for patient to call back if further questions/concerns arise, however, message left that MD was out of the office at this time and the office would respond and provide results at MD's return;

## 2019-01-08 NOTE — Telephone Encounter (Signed)
Pt called about his results

## 2019-01-09 DIAGNOSIS — R1013 Epigastric pain: Secondary | ICD-10-CM | POA: Diagnosis not present

## 2019-01-09 DIAGNOSIS — J310 Chronic rhinitis: Secondary | ICD-10-CM | POA: Diagnosis not present

## 2019-01-09 DIAGNOSIS — R110 Nausea: Secondary | ICD-10-CM | POA: Diagnosis not present

## 2019-01-14 ENCOUNTER — Ambulatory Visit: Payer: Medicare Other | Admitting: Gastroenterology

## 2019-01-16 ENCOUNTER — Ambulatory Visit: Payer: Medicare Other | Admitting: Gastroenterology

## 2019-01-16 DIAGNOSIS — J310 Chronic rhinitis: Secondary | ICD-10-CM | POA: Diagnosis not present

## 2019-01-16 DIAGNOSIS — R110 Nausea: Secondary | ICD-10-CM | POA: Diagnosis not present

## 2019-01-20 ENCOUNTER — Encounter: Payer: Self-pay | Admitting: Internal Medicine

## 2019-01-20 ENCOUNTER — Ambulatory Visit (INDEPENDENT_AMBULATORY_CARE_PROVIDER_SITE_OTHER): Payer: Medicare Other | Admitting: Internal Medicine

## 2019-01-20 VITALS — BP 126/68 | HR 66 | Temp 98.0°F | Resp 18 | Ht 74.0 in | Wt 143.5 lb

## 2019-01-20 DIAGNOSIS — M810 Age-related osteoporosis without current pathological fracture: Secondary | ICD-10-CM | POA: Diagnosis not present

## 2019-01-20 DIAGNOSIS — K219 Gastro-esophageal reflux disease without esophagitis: Secondary | ICD-10-CM

## 2019-01-20 DIAGNOSIS — R739 Hyperglycemia, unspecified: Secondary | ICD-10-CM | POA: Diagnosis not present

## 2019-01-20 DIAGNOSIS — R5383 Other fatigue: Secondary | ICD-10-CM

## 2019-01-20 DIAGNOSIS — I480 Paroxysmal atrial fibrillation: Secondary | ICD-10-CM

## 2019-01-20 DIAGNOSIS — R110 Nausea: Secondary | ICD-10-CM

## 2019-01-20 NOTE — Patient Instructions (Addendum)
Please schedule Medicare Wellness with Glenard Haring.   GO TO THE LAB : Get the blood work    Decrease Protonix to 1 tablet every other day and then stop; if you feel that your stomach problems get worse, please go back on the medication as before

## 2019-01-20 NOTE — Progress Notes (Signed)
Subjective:    Patient ID: [name], male    DOB: 08-19-1930, 85 y.o.   MRN: [id number]  DOS:  01/20/2019 Type of visit - description: Follow-up Since the last office visit, went to the ER and saw GI, extensive chart review. Dyspepsia symptoms improved, still on Protonix once daily and wonders if he needs to stop them. Multaq stopped d/t GI s/e, no symptoms of atrial fibrillation.  He c/o fatigue, he reports that although his energy is overall okay, he still feels tired in the afternoons, feels better in the mornings.    Wt Readings from Last 3 Encounters:  01/20/19 143 lb 8 oz (65.1 kg)  12/25/18 144 lb 4 oz (65.4 kg)  12/13/18 151 lb (68.5 kg)     Review of Systems No fevers but feels "cold" in the afternoon.  No night sweats. No chest pain, difficulty breathing or edema. No cough or wheezing No unusual aches or headaches. No anxiety or depression. Past Medical History:  Diagnosis Date  . Anxiety   . Atrial fibrillation ([place])   . Blood transfusion without reported diagnosis   . CAD    a. 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  . Cataract   . Chronic abdominal pain   . Chronic nausea   . Coronary artery disease   . Diverticulosis 2011   Diverticulitis 2004  . Dyspepsia   . Esophageal reflux   . GERD (gastroesophageal reflux disease)   . Headache    saw neuro 4-16, MRI done  . HYPERPLASIA, PROSTATE NOS W/URINARY OBST/LUTS   . IBS   . INGUINAL HERNIA   . Lichen planus [id number]   [name]  . LUMBAR RADICULOPATHY, RIGHT   . Mixed hyperlipidemia   . OSTEOPOROSIS   . PONV (postoperative nausea and vomiting)   . PREMATURE VENTRICULAR CONTRACTIONS    a. After CABG - was on Coumadin/Multaq for a period of time. Coumadin discontinued 04/2010 after maintaining NSR.  [name] UNSPECIFIED ANEMIA     Past Surgical History:  Procedure Laterality Date  . BIOPSY  09/10/2018   Procedure: BIOPSY;   Surgeon: Jerene Bears, MD;  Location: [facility];  Service: Gastroenterology;;  . CATARACT EXTRACTION Right 10/06/2013  . CORONARY ARTERY BYPASS GRAFT     2 VD;anomalous RCA;post op compliacted by AF  . ESOPHAGOGASTRODUODENOSCOPY (EGD) WITH PROPOFOL N/A 09/10/2018   Procedure: ESOPHAGOGASTRODUODENOSCOPY (EGD) WITH PROPOFOL;  Surgeon: Jerene Bears, MD;  Location: [facility];  Service: Gastroenterology;  Laterality: N/A;  . HIP FRACTURE SURGERY Right 03/2007   Dr. Salvadore Farber  . INGUINAL HERNIA REPAIR Left 2012   Dr Brantley Stage  . KNEE SURGERY Right 1977   repair  . PROSTATE BIOPSY  09/19/2013   [facility]    Social History   Socioeconomic History  . Marital status: Widowed    Spouse name: Not on file  . Number of children: 0  . Years of education: Not on file  . Highest education level: Not on file  Occupational History  . Occupation: retired    Fish farm manager: RETIRED  Social Needs  . Financial resource strain: Not on file  . Food insecurity:    Worry: Not on file    Inability: Not on file  . Transportation needs:    Medical: Not on file    Non-medical: Not on file  Tobacco Use  . Smoking status: Former Smoker  Types: Cigarettes    Last attempt to quit: 11/27/1962    Years since quitting: 56.1  . Smokeless tobacco: Never Used  Substance and Sexual Activity  . Alcohol use: Not Currently    Alcohol/week: 0.0 standard drinks    Comment: quit drinking   . Drug use: No  . Sexual activity: Not Currently  Lifestyle  . Physical activity:    Days per week: Not on file    Minutes per session: Not on file  . Stress: Not on file  Relationships  . Social connections:    Talks on phone: Not on file    Gets together: Not on file    Attends religious service: Not on file    Active member of club or organization: Not on file    Attends meetings of clubs or organizations: Not on file    Relationship status: Not on file  . Intimate partner violence:    Fear of current or ex  partner: Not on file    Emotionally abused: Not on file    Physically abused: Not on file    Forced sexual activity: Not on file  Other Topics Concern  . Not on file  Social History Narrative   Lives at [place] in independent living.     Lost wife 06-2016.     Has no children.  Family: nephews-nices in [place]   Retired from [employer].     Still drives.       Allergies as of 01/20/2019      Reactions   Amoxicillin Other (See Comments)   REACTION: n/v/d   Zoloft [sertraline Hcl]    Patient didn't like the way it made him feel   Remeron [mirtazapine] Other (See Comments)   Excessive Drowsiness      Medication List       Accurate as of January 20, 2019  5:34 PM. Always use your most recent med list.        acetaminophen 500 MG tablet Commonly known as:  TYLENOL Take 500 mg by mouth every 6 (six) hours as needed for mild pain.   cholecalciferol 1000 units tablet Commonly known as:  VITAMIN D Take 1,000 Units by mouth every morning.   ELIQUIS 5 MG Tabs tablet Generic drug:  apixaban TAKE ONE TABLET BY MOUTH TWICE DAILY   folic acid 1 MG tablet Commonly known as:  FOLVITE TAKE ONE (1) TABLET BY MOUTH EACH DAY   multivitamin with minerals Tabs tablet Take 1 tablet by mouth daily.   nitroGLYCERIN 0.4 MG SL tablet Commonly known as:  NITROSTAT Place 1 tablet (0.4 mg total) under the tongue every 5 (five) minutes x 3 doses as needed for chest pain.   pantoprazole 40 MG tablet Commonly known as:  PROTONIX Take one tablet by mouth twice daily 30 minutes before breakfast and dinner x 2 weeks and then once daily.   rosuvastatin 10 MG tablet Commonly known as:  CRESTOR TAKE ONE (1) TABLET BY MOUTH EVERY DAY   TUMS PO Take 1 tablet by mouth as needed (heartburn). For calcium           Objective:   Physical Exam BP 126/68 (BP Location: Left Arm, Patient Position: Sitting, Cuff Size: Small)   Pulse 66   Temp 98 F (36.7 C) (Oral)   Resp 18   Ht 6'  2" (1.88 m)   Wt 143 lb 8 oz (65.1 kg)   SpO2 94%   BMI 18.42 kg/m  General:  Well developed, NAD, BMI noted.  Overall, he looks better today than he has been in the last couple of visits. Neck: No JVD at 45 degrees HEENT:  Normocephalic . Face symmetric, atraumatic Lungs:  CTA B Normal respiratory effort, no intercostal retractions, no accessory muscle use. Heart: RRR,  no murmur.  No pretibial edema bilaterally Abdomen soft, nontender Skin: Not pale. Not jaundice Neurologic:  alert & oriented X3.  Speech normal, gait appropriate for age and unassisted Psych--  Cognition and judgment appear intact.  Cooperative with normal attention span and concentration.  Behavior appropriate. No anxious or depressed appearing.      Assessment     Assessment Prediabetes, A1c 6.0 (2015) Hyperlipidemia Anxiety, depression:  -intol to zoloft, Rx lexapro 5 mg 07-2015: intolerant d/t nausea  -lost wife July 2017 CV: ---CAD--CABG 2011 ----P- Atrial fibrillation, PVCs.  Elliquis ; Multaq d/c d/t GI s/e ----Palpable Ao x years, [place] (-) for AAA 2005 COPD (per CXR 06-2015), PFTs 10-04-15 mild obstruction DJD  Gait-- uses a walker prn GI: --GERD, IBS, diverticulosis --Chronic nausea: 2019.  Work-up: 08-2018: Negative EGD, CT  and US abdomen. 12/2018 wnl Gastric empty stomach.  Symptoms decreased after Multaq stopped GU: elevated PSA, BPH , (-) bx 2014 Osteoporosis: -hip FX 2008 - dexa ~2005 --> rx fosamax, took x 1 year, dexa ~2010 was rec no fosamax at that time. Dexa 09-2015: T score -2.4 --> rx fosamax, d/c d/t jaw pain 06-2017; prolia #25 February 1699 Lichen planus Headache -- saw neurology 02-2015, had a MRI/MRA (-)  PLAN Fatigue: Again, reports lack of energy in the afternoon, no specific symptoms, no volume overload on exam.  Recent labs, chest x-ray and EKG at the ER were all okay. Recommend observation for now. Hyperglycemia: Check a A1c Nausea: Since her last office visit, he went to  the ER and subsequently to see the gastroenterologist due to chronic nausea with dyspepsia. Symptoms were believed to be due to [facility] and they improve after that medication was stopped.   08-2018: Negative EGD, CT  and US abdomen. 12/2018 wnl Gastric empty stomach. Overall he feels better, currently on Protonix 1 tablet daily, wonders if he could stop it.  We agreed to decrease to every other day and then stop and see how he does.  See AVS. P- atrial fibrillation: Multaq d/c d/t s/e, he seems to be  On NSR today, wonders if he needs to see cardiology but at this point he is a stable. Osteoporosis: Saw Dr. Cruzita Lederer, DEXA pending, patient working on getting that  scheduled. RTC already scheduled 04-2019   Today, I spent more than 25   min with the patient: >50% of the time counseling regards fatigue and nausea.  He is still concerned about lack of energy in the afternoon, I did extensive review of systems and chart reviewed.  I was able to reassure him and recommend observation for now.

## 2019-01-20 NOTE — Assessment & Plan Note (Addendum)
Fatigue: Again, reports lack of energy in the afternoon, no specific symptoms, no volume overload on exam.  Recent labs, chest x-ray and EKG at the ER were all okay. Recommend observation for now. Hyperglycemia: Check a A1c Nausea: Since her last office visit, he went to the ER and subsequently to see the gastroenterologist due to chronic nausea with dyspepsia. Symptoms were believed to be due to [facility] and they improve after that medication was stopped.   08-2018: Negative EGD, CT  and US abdomen. 12/2018 wnl Gastric empty stomach. Overall he feels better, currently on Protonix 1 tablet daily, wonders if he could stop it.  We agreed to decrease to every other day and then stop and see how he does.  See AVS. P- atrial fibrillation: Multaq d/c d/t s/e, he seems to be  On NSR today, wonders if he needs to see cardiology but at this point he is a stable. Osteoporosis: Saw Dr. Cruzita Lederer, DEXA pending, patient working on getting that  scheduled. RTC already scheduled 04-2019

## 2019-01-20 NOTE — Progress Notes (Signed)
Pre visit review using our clinic review tool, if applicable. No additional management support is needed unless otherwise documented below in the visit note. 

## 2019-01-21 LAB — HEMOGLOBIN A1C: Hgb A1c MFr Bld: 6.1 % (ref 4.6–6.5)

## 2019-01-22 ENCOUNTER — Ambulatory Visit (INDEPENDENT_AMBULATORY_CARE_PROVIDER_SITE_OTHER)
Admission: RE | Admit: 2019-01-22 | Discharge: 2019-01-22 | Disposition: A | Discharge status: Home / Self Care | Payer: Medicare Other | Source: Ambulatory Visit | Attending: Internal Medicine | Admitting: Internal Medicine

## 2019-01-22 DIAGNOSIS — M810 Age-related osteoporosis without current pathological fracture: Secondary | ICD-10-CM

## 2019-01-24 ENCOUNTER — Telehealth: Payer: Self-pay

## 2019-01-24 NOTE — Telephone Encounter (Signed)
Left message for patient to return our call at [phone].  

## 2019-01-24 NOTE — Telephone Encounter (Signed)
-----   Message from Philemon Kingdom, MD sent at 01/23/2019  5:35 PM EST ----- Lenna Sciara, can you please call pt: Bone density worsened at the level of the femoral neck.  I will await his decision about treatment, based on what we discussed at last visit

## 2019-01-27 NOTE — Telephone Encounter (Signed)
Notified patient, his concerns are if either medications are covered by his insurance and what the cost to him will be.

## 2019-01-27 NOTE — Telephone Encounter (Signed)
Can we submit him to the portal for Even[employer]?

## 2019-01-29 ENCOUNTER — Other Ambulatory Visit: Payer: Self-pay

## 2019-01-29 MED ORDER — ABALOPARATIDE 3120 MCG/1.56ML ~~LOC~~ SOPN: 80.0000 ug | PEN_INJECTOR | Freq: Every day | SUBCUTANEOUS | 3 refills | Status: DC

## 2019-01-30 ENCOUNTER — Telehealth: Payer: Self-pay | Admitting: Internal Medicine

## 2019-01-30 NOTE — Telephone Encounter (Signed)
Prescription sent

## 2019-01-30 NOTE — Telephone Encounter (Signed)
Refill request for claritin- no longer on med list. Okay to refill?

## 2019-01-31 NOTE — Telephone Encounter (Signed)
The Evenity has only been approved for postmenopausal women at this time-after speaking with Dr. Cruzita Lederer I have ordered [facility] for this patient and made him aware of this

## 2019-03-05 ENCOUNTER — Other Ambulatory Visit: Payer: Self-pay | Admitting: Pharmacist

## 2019-03-05 MED ORDER — APIXABAN 5 MG PO TABS: 5.0000 mg | ORAL_TABLET | Freq: Two times a day (BID) | ORAL | 5 refills | Status: DC

## 2019-05-01 ENCOUNTER — Other Ambulatory Visit: Payer: Self-pay

## 2019-05-01 MED ORDER — APIXABAN 5 MG PO TABS: 5.0000 mg | ORAL_TABLET | Freq: Two times a day (BID) | ORAL | 5 refills | Status: DC

## 2019-05-01 NOTE — Telephone Encounter (Signed)
Pt last saw Dr Johnsie Cancel 08/19/18, last labs 12/19/18 Creat 0.91, age 85, weight 65.1kg, based on specified criteria pt is on appropriate dosage of Eliquis 5mg  BID.  Will refill rx.

## 2019-05-26 ENCOUNTER — Other Ambulatory Visit: Payer: Self-pay

## 2019-05-27 ENCOUNTER — Encounter: Payer: Self-pay | Admitting: Internal Medicine

## 2019-05-27 ENCOUNTER — Ambulatory Visit (INDEPENDENT_AMBULATORY_CARE_PROVIDER_SITE_OTHER): Payer: Medicare Other | Admitting: Internal Medicine

## 2019-05-27 VITALS — BP 120/51 | HR 64 | Temp 98.1°F | Resp 16 | Ht 74.0 in | Wt 150.2 lb

## 2019-05-27 DIAGNOSIS — E782 Mixed hyperlipidemia: Secondary | ICD-10-CM | POA: Diagnosis not present

## 2019-05-27 DIAGNOSIS — Z0000 Encounter for general adult medical examination without abnormal findings: Secondary | ICD-10-CM | POA: Diagnosis not present

## 2019-05-27 DIAGNOSIS — I480 Paroxysmal atrial fibrillation: Secondary | ICD-10-CM

## 2019-05-27 LAB — LIPID PANEL
Cholesterol: 96 mg/dL (ref 0–200)
HDL: 57.2 mg/dL (ref >39.00)
LDL Cholesterol: 24 mg/dL (ref 0–99)
NonHDL: 38.49
Total CHOL/HDL Ratio: 2
Triglycerides: 73 mg/dL (ref 0.0–149.0)
VLDL: 14.6 mg/dL (ref 0.0–40.0)

## 2019-05-27 LAB — CBC WITH DIFFERENTIAL/PLATELET
Basophils Absolute: 0.1 10*3/uL (ref 0.0–0.1)
Basophils Relative: 1.1 % (ref 0.0–3.0)
Eosinophils Absolute: 0.4 10*3/uL (ref 0.0–0.7)
Eosinophils Relative: 4.8 % (ref 0.0–5.0)
HCT: 40.9 % (ref 39.0–52.0)
Hemoglobin: 13.6 g/dL (ref 13.0–17.0)
Lymphocytes Relative: 16.9 % (ref 12.0–46.0)
Lymphs Abs: 1.2 10*3/uL (ref 0.7–4.0)
MCHC: 33.1 g/dL (ref 30.0–36.0)
MCV: 95 fl (ref 78.0–100.0)
Monocytes Absolute: 0.6 10*3/uL (ref 0.1–1.0)
Monocytes Relative: 8.8 % (ref 3.0–12.0)
Neutro Abs: 5.1 10*3/uL (ref 1.4–7.7)
Neutrophils Relative %: 68.4 % (ref 43.0–77.0)
Platelets: 185 10*3/uL (ref 150.0–400.0)
RBC: 4.31 Mil/uL (ref 4.22–5.81)
RDW: 15.2 % (ref 11.5–15.5)
WBC: 7.4 10*3/uL (ref 4.0–10.5)

## 2019-05-27 LAB — COMPREHENSIVE METABOLIC PANEL
ALT: 12 U/L (ref 0–53)
AST: 20 U/L (ref 0–37)
Albumin: 4.2 g/dL (ref 3.5–5.2)
Alkaline Phosphatase: 50 U/L (ref 39–117)
BUN: 13 mg/dL (ref 6–23)
CO2: 31 mEq/L (ref 19–32)
Calcium: 9.4 mg/dL (ref 8.4–10.5)
Chloride: 101 mEq/L (ref 96–112)
Creatinine, Ser: 0.77 mg/dL (ref 0.40–1.50)
GFR: 95.64 mL/min (ref >60.00)
Glucose, Bld: 92 mg/dL (ref 70–99)
Potassium: 4.8 mEq/L (ref 3.5–5.1)
Sodium: 138 mEq/L (ref 135–145)
Total Bilirubin: 0.4 mg/dL (ref 0.2–1.2)
Total Protein: 6.7 g/dL (ref 6.0–8.3)

## 2019-05-27 NOTE — Progress Notes (Signed)
Pre visit review using our clinic review tool, if applicable. No additional management support is needed unless otherwise documented below in the visit note. 

## 2019-05-27 NOTE — Assessment & Plan Note (Addendum)
-  Tdap 2012 -PNA-- 05/17/15; prevnar 2016 -zostavax/shingrix shot declined again.   -Rec a early Flu shot for 2020 -CCS, prostate ca screening: : no further screenings; pt confirmed his decision - diet -exercise discussed

## 2019-05-27 NOTE — Patient Instructions (Signed)
Get the blood work     [place] Schedule your next appointment for follow-up in 6 months  Please get a flu shot early this year, in September and October  Please consider using a cane to help your stability and gait

## 2019-05-27 NOTE — Progress Notes (Signed)
Subjective:    Patient ID: [name], male    DOB: [birth year], 85 y.o.   MRN: [id number]  DOS:  05/27/2019 Type of visit - description: CPX Reports no major concerns, actually he feels very good. No recent GI symptoms He lost weight last year but he is working on gaining some weight back, trying to eat abundantly but healthy.   Wt Readings from Last 3 Encounters:  05/27/19 150 lb 4 oz (68.2 kg)  01/20/19 143 lb 8 oz (65.1 kg)  12/25/18 144 lb 4 oz (65.4 kg)     Review of Systems  Occasionally experience mid back pain when he reaches up or in certain positions.  Symptoms are sporadic, mild.  Other than above, a 14 point review of systems is negative      Past Medical History:  Diagnosis Date  . Anxiety   . Atrial fibrillation ([place])   . Blood transfusion without reported diagnosis   . CAD    a. 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  . Cataract   . Chronic abdominal pain   . Chronic nausea   . Coronary artery disease   . Diverticulosis 2011   Diverticulitis 2004  . Dyspepsia   . Esophageal reflux   . GERD (gastroesophageal reflux disease)   . Headache    saw neuro 4-16, MRI done  . HYPERPLASIA, PROSTATE NOS W/URINARY OBST/LUTS   . IBS   . INGUINAL HERNIA   . Lichen planus [id number]   [name]  . LUMBAR RADICULOPATHY, RIGHT   . Mixed hyperlipidemia   . OSTEOPOROSIS   . PONV (postoperative nausea and vomiting)   . PREMATURE VENTRICULAR CONTRACTIONS    a. After CABG - was on Coumadin/Multaq for a period of time. Coumadin discontinued 04/2010 after maintaining NSR.  [name] UNSPECIFIED ANEMIA     Past Surgical History:  Procedure Laterality Date  . BIOPSY  09/10/2018   Procedure: BIOPSY;  Surgeon: Jerene Bears, MD;  Location: [facility];  Service: Gastroenterology;;  . CATARACT EXTRACTION Right 10/06/2013  . CORONARY ARTERY BYPASS GRAFT     2 VD;anomalous RCA;post op compliacted by AF   . ESOPHAGOGASTRODUODENOSCOPY (EGD) WITH PROPOFOL N/A 09/10/2018   Procedure: ESOPHAGOGASTRODUODENOSCOPY (EGD) WITH PROPOFOL;  Surgeon: Jerene Bears, MD;  Location: [facility];  Service: Gastroenterology;  Laterality: N/A;  . HIP FRACTURE SURGERY Right 03/2007   Dr. Salvadore Farber  . INGUINAL HERNIA REPAIR Left 2012   Dr Brantley Stage  . KNEE SURGERY Right 1977   repair  . PROSTATE BIOPSY  09/19/2013   [facility]    Social History   Socioeconomic History  . Marital status: Widowed    Spouse name: Not on file  . Number of children: 0  . Years of education: Not on file  . Highest education level: Not on file  Occupational History  . Occupation: retired    Fish farm manager: RETIRED  Social Needs  . Financial resource strain: Not on file  . Food insecurity    Worry: Not on file    Inability: Not on file  . Transportation needs    Medical: Not on file    Non-medical: Not on file  Tobacco Use  . Smoking status: Former Smoker    Types: Cigarettes    Quit date: 11/27/1962    Years since quitting: 56.5  . Smokeless tobacco: Never Used  Substance and Sexual Activity  .  Alcohol use: Not Currently    Alcohol/week: 0.0 standard drinks    Comment: quit drinking   . Drug use: No  . Sexual activity: Not Currently  Lifestyle  . Physical activity    Days per week: Not on file    Minutes per session: Not on file  . Stress: Not on file  Relationships  . Social Herbalist on phone: Not on file    Gets together: Not on file    Attends religious service: Not on file    Active member of club or organization: Not on file    Attends meetings of clubs or organizations: Not on file    Relationship status: Not on file  . Intimate partner violence    Fear of current or ex partner: Not on file    Emotionally abused: Not on file    Physically abused: Not on file    Forced sexual activity: Not on file  Other Topics Concern  . Not on file  Social History Narrative   Lives at [place]  in independent living.     Lost wife 06-2016.     Has no children.  Family: nephews-nices in [place]   Retired from [employer].     Still drives.      Family History  Problem Relation Age of Onset  . Stroke Mother   . Pancreatic cancer Father        Deceased, 61  . Breast cancer Sister        Deceased  . Heart disease Sister 71  . Stroke Maternal Aunt   . Colon cancer Neg Hx   . Prostate cancer Neg Hx   . Esophageal cancer Neg Hx   . Rectal cancer Neg Hx   . Stomach cancer Neg Hx      Allergies as of 05/27/2019      Reactions   Amoxicillin Other (See Comments)   REACTION: n/v/d   Zoloft [sertraline Hcl]    Patient didn't like the way it made him feel   Remeron [mirtazapine] Other (See Comments)   Excessive Drowsiness      Medication List       Accurate as of May 27, 2019  9:01 PM. If you have any questions, ask your nurse or doctor.        Abaloparatide 3120 MCG/1.56ML Sopn Commonly known as: Tymlos Inject 80 mcg into the skin daily.   acetaminophen 500 MG tablet Commonly known as: TYLENOL Take 500 mg by mouth every 6 (six) hours as needed for mild pain.   apixaban 5 MG Tabs tablet Commonly known as: Eliquis Take 1 tablet (5 mg total) by mouth 2 (two) times daily.   cholecalciferol 1000 units tablet Commonly known as: VITAMIN D Take 1,000 Units by mouth every morning.   folic acid 1 MG tablet Commonly known as: FOLVITE TAKE ONE (1) TABLET BY MOUTH EACH DAY What changed: See the new instructions.   loratadine 10 MG tablet Commonly known as: CLARITIN Take 0.5-1 tablets (5-10 mg total) by mouth daily as needed for allergies.   multivitamin with minerals Tabs tablet Take 1 tablet by mouth daily.   nitroGLYCERIN 0.4 MG SL tablet Commonly known as: NITROSTAT Place 1 tablet (0.4 mg total) under the tongue every 5 (five) minutes x 3 doses as needed for chest pain.   pantoprazole 40 MG tablet Commonly known as: PROTONIX Take one tablet by mouth  twice daily 30 minutes before breakfast and dinner x  2 weeks and then once daily.   rosuvastatin 10 MG tablet Commonly known as: CRESTOR TAKE ONE (1) TABLET BY MOUTH EVERY DAY What changed: See the new instructions.   TUMS PO Take 1 tablet by mouth as needed (heartburn). For calcium           Objective:   Physical Exam BP (!) 120/51 (BP Location: Left Arm, Patient Position: Sitting, Cuff Size: Small)   Pulse 64   Temp 98.1 F (36.7 C) (Oral)   Resp 16   Ht 6\' 2"  (1.88 m)   Wt 150 lb 4 oz (68.2 kg)   SpO2 100%   BMI 19.29 kg/m  General: Well developed, NAD, BMI noted HEENT:  Normocephalic . Face symmetric, atraumatic Lungs:  CTA B Normal respiratory effort, no intercostal retractions, no accessory muscle use. Heart: RRR,  no murmur.  No pretibial edema bilaterally  Abdomen:  Not distended, soft, non-tender. No rebound or rigidity.  + Palpable, nontender aorta, mild bruit noted Skin: Exposed areas without rash. Not pale. Not jaundice Neurologic:  alert & oriented X3.  Speech normal, gait: Unassisted, slightly hesitant. Strength symmetric and appropriate for age.  Psych: Cognition and judgment appear intact.  Cooperative with normal attention span and concentration.  Behavior appropriate. No anxious or depressed appearing.     Assessment    Assessment Prediabetes, A1c 6.0 (2015) Hyperlipidemia Anxiety, depression:  -intol to zoloft, Rx lexapro 5 mg 07-2015: intolerant d/t nausea  -lost wife July 2017 CV: ---CAD--CABG 2011 ----P- Atrial fibrillation, PVCs.  Elliquis ; Multaq d/c d/t GI s/e ----Palpable Ao x years, [place] (-) for AAA 2005 COPD (per CXR 06-2015), PFTs 10-04-15 mild obstruction DJD  Gait-- uses a walker prn GI: --GERD, IBS, diverticulosis --Chronic nausea: 2019.  Work-up: 08-2018: Negative EGD, CT  and US abdomen. 12/2018 wnl Gastric empty stomach.  Symptoms decreased after Multaq stopped GU: elevated PSA, BPH , (-) bx 2014 Osteoporosis: -hip FX  2008 - dexa ~2005 --> rx fosamax, took x 1 year, dexa ~2010 was rec no fosamax at that time. Dexa 09-2015: T score -2.4 --> rx fosamax, d/c d/t jaw pain 06-2017; prolia #25 February 6545 Lichen planus Headache -- saw neurology 02-2015, had a MRI/MRA (-)  PLAN  Prediabetes: Last A1c satisfactory Hyperlipidemia: Currently on Crestor, checking FLP Anxiety depression: Not an issue at this point CAD: Asymptomatic, next visit with cardiology 07-2019. Atrial fibrillation: Seems to be regular today. GERD, chronic nausea: Not an issue at this point. Osteoporosis: Last T score was (-)  3.0 on February 2020, he was recommended Tymlos but the patient has not proceeded at this point time he does not think he will.  Recommend to discuss with Endo. Social: Lives at [facility], on the independent side, still drives. RTC 6 months

## 2019-05-27 NOTE — Assessment & Plan Note (Signed)
Prediabetes: Last A1c satisfactory Hyperlipidemia: Currently on Crestor, checking FLP Anxiety depression: Not an issue at this point CAD: Asymptomatic, next visit with cardiology 07-2019. Atrial fibrillation: Seems to be regular today. GERD, chronic nausea: Not an issue at this point. Osteoporosis: Last T score was (-)  3.0 on February 2020, he was recommended Tymlos but the patient has not proceeded at this point time he does not think he will.  Recommend to discuss with Endo. Social: Lives at [facility], on the independent side, still drives. RTC 6 months

## 2019-07-03 ENCOUNTER — Other Ambulatory Visit: Payer: Self-pay | Admitting: Cardiovascular Disease

## 2019-07-29 DIAGNOSIS — Z961 Presence of intraocular lens: Secondary | ICD-10-CM | POA: Diagnosis not present

## 2019-07-29 DIAGNOSIS — H26492 Other secondary cataract, left eye: Secondary | ICD-10-CM | POA: Diagnosis not present

## 2019-08-05 ENCOUNTER — Other Ambulatory Visit: Payer: Self-pay | Admitting: Cardiovascular Disease

## 2019-09-11 NOTE — Progress Notes (Signed)
Patient ID: [name], male   DOB: 1930/07/20, 85 y.o.   MRN: [id number]     85 y.o. with CABG 2011 non ischemic myovue 09/08/15. PAF post CABG and recurred 2015 with GI illness and placed on Eliquis and Multaq. History of anemia improved with Folate. Wife passed in 2017 and he is at [facility] now. Tends toward bradycardia and Toprol stopped in 2015  Myovue 09/08/15 reviewed low risk   Nuclear stress EF: 62%.  The left ventricular ejection fraction is normal (55-65%).  1 mm of Horizontal ST segment depression was noted during stress in the II, III, aVF, V6, V5 and V4 leads.  Defect 1: There is a moderate sized defect of mild severity present in the basal inferoseptal, basal inferior, basal inferolateral, apical anterior and apical septal location. This is a fixed defect and in the setting of normal LVF this is most consistent with diaphragamatic attenuation artifact.  This is a low risk study.   In ER 08/16/18 with chest pain Indigestion in epigastric region for a week and related to vomiting and nausea Rx with carafate  CT abdomen non acute multaq stopped ? Contributing to nausea Myovue non ischemic 08/27/18  [name] has gained his weight back and no further Nausea ? From Multaq but he had been on it for years. Dr [name] took him off ASA but he needs to be on this for his by pass and CAD will restart 81 mg ASA    ROS: Denies fever, malais, weight loss, blurry vision, decreased visual acuity, cough, sputum, SOB, hemoptysis, pleuritic pain, palpitaitons, heartburn, abdominal pain, melena, lower extremity edema, claudication, or rash.  All other systems reviewed and negative  General: BP (!) 140/56   Pulse 70   Ht 6\' 2"  (1.88 m)   Wt 160 lb (72.6 kg)   SpO2 98%   BMI 20.54 kg/m  Affect appropriate Healthy:  appears stated age 46: normal Neck supple with no adenopathy JVP normal no bruits no thyromegaly Lungs clear with no wheezing and good diaphragmatic motion Heart:  S1/S2 no  murmur, no rub, gallop or click PMI normal post sternotomy  Abdomen: benighn, BS positve, no tenderness, no AAA no bruit.  No HSM or HJR Distal pulses intact with no bruits No edema Neuro non-focal Skin warm and dry No muscular weakness    Current Outpatient Medications  Medication Sig Dispense Refill  . acetaminophen (TYLENOL) 500 MG tablet Take 500 mg by mouth every 6 (six) hours as needed for mild pain.    [name] apixaban (ELIQUIS) 5 MG TABS tablet Take 1 tablet (5 mg total) by mouth 2 (two) times daily. 60 tablet 5  . Calcium Carbonate Antacid (TUMS PO) Take 1 tablet by mouth as needed (heartburn). For calcium     . cholecalciferol (VITAMIN D) 1000 UNITS tablet Take 1,000 Units by mouth every morning.      . folic acid (FOLVITE) 1 MG tablet Take 1 tablet (1 mg total) by mouth daily. Please keep upcoming appt with Dr. Johnsie [name] in October for future refills. Thank you 30 tablet 1  . loratadine (CLARITIN) 10 MG tablet Take 0.5-1 tablets (5-10 mg total) by mouth daily as needed for allergies. 30 tablet 5  . Multiple Vitamin (MULITIVITAMIN WITH MINERALS) TABS Take 1 tablet by mouth daily.      . nitroGLYCERIN (NITROSTAT) 0.4 MG SL tablet Place 1 tablet (0.4 mg total) under the tongue every 5 (five) minutes x 3 doses as needed for chest pain. Dunn  tablet 2  . pantoprazole (PROTONIX) 40 MG tablet Take one tablet by mouth twice daily 30 minutes before breakfast and dinner x 2 weeks and then once daily. 44 tablet 0  . rosuvastatin (CRESTOR) 10 MG tablet TAKE ONE (1) TABLET BY MOUTH EACH DAY 90 tablet 1   No current facility-administered medications for this visit.     Allergies  Amoxicillin, Zoloft [sertraline hcl], and Remeron [mirtazapine]  Electrocardiogram:  4/15  SB rate 45  ICRBBB  Toprol stopped  09/26/17 SR rate 54 ICRBBB   Assessment and Plan CAD/CABG:  2011   Normal myovue 08/27/18 EF 62% continue medical Rx   PAF: Maint NSR no palpitaitons on eliquis Multaq d/c due to nausea No beta  blockers due to bradycardia  GERD continue protonix concern for weight loss CT abdomen negative FU GI    Chol  Cholesterol is at goal. Decrease crestor to 5 mg daily  Lab Results  Component Value Date   LDLCALC 24 05/27/2019    Anemia:  Improved on folate  Lab Results  Component Value Date   HCT 40.9 05/27/2019   Osteoporosis:  Active on fosamax f/u primary   [name]

## 2019-09-12 ENCOUNTER — Other Ambulatory Visit: Payer: Self-pay

## 2019-09-12 ENCOUNTER — Ambulatory Visit: Payer: Medicare Other | Admitting: Cardiovascular Disease

## 2019-09-12 ENCOUNTER — Encounter: Payer: Self-pay | Admitting: Cardiovascular Disease

## 2019-09-12 VITALS — BP 140/56 | HR 70 | Ht 74.0 in | Wt 160.0 lb

## 2019-09-12 DIAGNOSIS — Z951 Presence of aortocoronary bypass graft: Secondary | ICD-10-CM | POA: Diagnosis not present

## 2019-09-12 NOTE — Patient Instructions (Signed)
Medication Instructions:   *If you need a refill on your cardiac medications before your next appointment, please call your pharmacy*  Lab Work:  If you have labs (blood work) drawn today and your tests are completely normal, you will receive your results only by: [name] MyChart Message (if you have MyChart) OR . A paper copy in the mail If you have any lab test that is abnormal or we need to change your treatment, we will call you to review the results.  Testing/Procedures: None ordered today.  Follow-Up: At [facility], you and your health needs are our priority.  As part of our continuing mission to provide you with exceptional heart care, we have created designated Provider Care Teams.  These Care Teams include your primary Cardiologist (physician) and Advanced Practice Providers (APPs -  Physician Assistants and Nurse Practitioners) who all work together to provide you with the care you need, when you need it.  Your next appointment:   6 months  The format for your next appointment:   In Person  Provider:   You may see Dr. Johnsie Cancel or one of the following Advanced Practice Providers on your designated Care Team:    Truitt Merle, NP  Cecilie Kicks, NP  Kathyrn Drown, NP

## 2019-10-02 DIAGNOSIS — Z23 Encounter for immunization: Secondary | ICD-10-CM | POA: Diagnosis not present

## 2019-11-03 ENCOUNTER — Other Ambulatory Visit: Payer: Self-pay | Admitting: Cardiovascular Disease

## 2019-11-18 NOTE — Progress Notes (Signed)
Virtual Visit via Video Note  I connected with patient on 11/24/19 at 11:00 AM EST by audio enabled telemedicine application and verified that I am speaking with the correct person using two identifiers.   THIS ENCOUNTER IS A VIRTUAL VISIT DUE TO COVID-19 - PATIENT WAS NOT SEEN IN THE OFFICE. PATIENT HAS CONSENTED TO VIRTUAL VISIT / TELEMEDICINE VISIT   Location of patient: home  Location of provider: office  I discussed the limitations of evaluation and management by telemedicine and the availability of in person appointments. The patient expressed understanding and agreed to proceed.   Subjective:   [name] is a 85 y.o. male who presents for Medicare Annual/Subsequent preventive examination.  Review of Systems:  Home Safety/Smoke Alarms: Feels safe in home. Smoke alarms in place.  L[facility] in apt at [facility]. Using walker.   Male:       PSA-  Lab Results  Component Value Date   PSA 3.74 03/27/2018   PSA 3.15 08/08/2013   PSA 1.53 01/10/2011       Objective:    Vitals: Unable to assess. This visit is enabled though telemedicine due to Covid 19.   Advanced Directives 11/24/2019 12/19/2018 09/08/2018 08/16/2018 08/21/2017 06/30/2016 06/20/2016  Does Patient Have a Medical Advance Directive? Yes No No No Yes Yes Yes  Type of Paramedic of [place];Living will - - - [place];Living will [place] will  Does patient want to make changes to medical advance directive? No - Patient declined - - - - - No - Patient declined  Copy of [place] in Chart? Yes - validated most recent copy scanned in chart (See row information) - - - No - copy requested No - copy requested No - copy requested  Would patient like information on creating a medical advance directive? - No - Patient declined No - Patient declined - - - -  Pre-existing out of facility DNR order (yellow form or pink MOST form) - - -  - - - -    Tobacco Social History   Tobacco Use  Smoking Status Former Smoker  . Types: Cigarettes  . Quit date: 11/27/1962  . Years since quitting: 57.0  Smokeless Tobacco Never Used     Counseling given: Not Answered   Clinical Intake: Pain : No/denies pain     Past Medical History:  Diagnosis Date  . Anxiety   . Atrial fibrillation ([place])   . Blood transfusion without reported diagnosis   . CAD    a. 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  . Cataract   . Chronic abdominal pain   . Chronic nausea   . Coronary artery disease   . Diverticulosis 2011   Diverticulitis 2004  . Dyspepsia   . Esophageal reflux   . GERD (gastroesophageal reflux disease)   . Headache    saw neuro 4-16, MRI done  . HYPERPLASIA, PROSTATE NOS W/URINARY OBST/LUTS   . IBS   . INGUINAL HERNIA   . Lichen planus [id number]   [name]  . LUMBAR RADICULOPATHY, RIGHT   . Mixed hyperlipidemia   . OSTEOPOROSIS   . PONV (postoperative nausea and vomiting)   . PREMATURE VENTRICULAR CONTRACTIONS    a. After CABG - was on Coumadin/Multaq for a period of time. Coumadin discontinued 04/2010 after maintaining NSR.  [name] UNSPECIFIED ANEMIA  Past Surgical History:  Procedure Laterality Date  . BIOPSY  09/10/2018   Procedure: BIOPSY;  Surgeon: Jerene Bears, MD;  Location: [facility];  Service: Gastroenterology;;  . CATARACT EXTRACTION Right 10/06/2013  . CORONARY ARTERY BYPASS GRAFT     2 VD;anomalous RCA;post op compliacted by AF  . ESOPHAGOGASTRODUODENOSCOPY (EGD) WITH PROPOFOL N/A 09/10/2018   Procedure: ESOPHAGOGASTRODUODENOSCOPY (EGD) WITH PROPOFOL;  Surgeon: Jerene Bears, MD;  Location: [facility];  Service: Gastroenterology;  Laterality: N/A;  . HIP FRACTURE SURGERY Right 03/2007   Dr. Salvadore Farber  . INGUINAL HERNIA REPAIR Left 2012   Dr Brantley Stage  . KNEE SURGERY Right 1977   repair  . PROSTATE BIOPSY  09/19/2013    [facility]   Family History  Problem Relation Age of Onset  . Stroke Mother   . Pancreatic cancer Father        Deceased, 36  . Breast cancer Sister        Deceased  . Heart disease Sister 10  . Stroke Maternal Aunt   . Colon cancer Neg Hx   . Prostate cancer Neg Hx   . Esophageal cancer Neg Hx   . Rectal cancer Neg Hx   . Stomach cancer Neg Hx    Social History   Socioeconomic History  . Marital status: Widowed    Spouse name: Not on file  . Number of children: 0  . Years of education: Not on file  . Highest education level: Not on file  Occupational History  . Occupation: retired    Fish farm manager: RETIRED  Tobacco Use  . Smoking status: Former Smoker    Types: Cigarettes    Quit date: 11/27/1962    Years since quitting: 57.0  . Smokeless tobacco: Never Used  Substance and Sexual Activity  . Alcohol use: Not Currently    Alcohol/week: 0.0 standard drinks    Comment: quit drinking   . Drug use: No  . Sexual activity: Not Currently  Other Topics Concern  . Not on file  Social History Narrative   Lives at [facility] in independent living.     Lost wife 06-2016.     Has no children.  Family: nephews-nices in [place]   Retired from [employer].     Still drives.    Social Determinants of Health   Financial Resource Strain:   . Difficulty of Paying Living Expenses: Not on file  Food Insecurity:   . Worried About Charity fundraiser in the Last Year: Not on file  . Ran Out of Food in the Last Year: Not on file  Transportation Needs:   . Lack of Transportation (Medical): Not on file  . Lack of Transportation (Non-Medical): Not on file  Physical Activity:   . Days of Exercise per Week: Not on file  . Minutes of Exercise per Session: Not on file  Stress:   . Feeling of Stress : Not on file  Social Connections:   . Frequency of Communication with Friends and Family: Not on file  . Frequency of Social Gatherings with Friends and Family: Not on file  . Attends  Religious Services: Not on file  . Active Member of Clubs or Organizations: Not on file  . Attends Archivist Meetings: Not on file  . Marital Status: Not on file    Outpatient Encounter Medications as of 11/24/2019  Medication Sig  . acetaminophen (TYLENOL) 500 MG tablet Take 500 mg by mouth every 6 (six) hours as needed  for mild pain.  [name] apixaban (ELIQUIS) 5 MG TABS tablet Take 1 tablet (5 mg total) by mouth 2 (two) times daily.  [name] aspirin EC 81 MG tablet Take 81 mg by mouth daily.  . Calcium Carbonate Antacid (TUMS PO) Take 1 tablet by mouth as needed (heartburn). For calcium   . cholecalciferol (VITAMIN D) 1000 UNITS tablet Take 1,000 Units by mouth every morning.    . folic acid (FOLVITE) 1 MG tablet TAKE ONE (1) TABLET BY MOUTH EACH DAY  . loratadine (CLARITIN) 10 MG tablet Take 0.5-1 tablets (5-10 mg total) by mouth daily as needed for allergies.  . Multiple Vitamin (MULITIVITAMIN WITH MINERALS) TABS Take 1 tablet by mouth daily.    . pantoprazole (PROTONIX) 40 MG tablet Take one tablet by mouth twice daily 30 minutes before breakfast and dinner x 2 weeks and then once daily.  . rosuvastatin (CRESTOR) 10 MG tablet TAKE ONE (1) TABLET BY MOUTH EACH DAY  . nitroGLYCERIN (NITROSTAT) 0.4 MG SL tablet Place 1 tablet (0.4 mg total) under the tongue every 5 (five) minutes x 3 doses as needed for chest pain. (Patient not taking: Reported on 11/24/2019)   No facility-administered encounter medications on file as of 11/24/2019.    Activities of Daily Living In your present state of health, do you have any difficulty performing the following activities: 11/24/2019 12/13/2018  Hearing? N N  Vision? N N  Difficulty concentrating or making decisions? N N  Walking or climbing stairs? N N  Dressing or bathing? N N  Doing errands, shopping? Aggie Moats  Comment uses [place] [employer] and eating ? N -  Using the Toilet? N -  In the past six months, have you  accidently leaked urine? N -  Do you have problems with loss of bowel control? N -  Managing your Medications? N -  Managing your Finances? N -  Housekeeping or managing your Housekeeping? N -  Some recent data might be hidden    Patient Care Team: Colon Branch, MD as PCP - General (Internal Medicine) Bjorn Loser, MD as Consulting Physician (Urology) Josue Hector, MD as Consulting Physician (Cardiology) Sydnee Levans, MD as Consulting Physician (Dermatology) Deneise Lever, MD as Consulting Physician (Pulmonary Disease) Alda Berthold, DO as Consulting Physician (Neurology) Inda Castle, MD (Inactive) as Consulting Physician (Gastroenterology) Clent Jacks, MD as Consulting Physician (Ophthalmology)   Assessment:   This is a routine wellness examination for [name]. Physical assessment deferred to PCP.  Exercise Activities and Dietary recommendations Current Exercise Habits: The patient does not participate in regular exercise at present, Exercise limited by: Other - see comments(back and hip pain) Diet (meal preparation, eat out, water intake, caffeinated beverages, dairy products, fruits and vegetables): in general, a "healthy" diet  , well balanced   Goals    . Maintain current health (pt-stated)       Fall Risk Fall Risk  11/24/2019 12/13/2018 08/21/2017 03/24/2016 08/27/2015  Falls in the past year? 0 0 No No No  Follow up Education provided;Falls prevention discussed - - - -     Depression Screen PHQ 2/9 Scores 12/13/2018 11/22/2018 08/21/2017 06/22/2016  PHQ - 2 Score 1 1 1 3   PHQ- 9 Score 2 - - 5  Exception Documentation - - - -    Cognitive Function Ad8 score reviewed for issues:  Issues making decisions: no  Less interest in hobbies / activities:no  Repeats questions, stories (family complaining): no  Trouble using ordinary gadgets (microwave, computer, phone):no  Forgets the month or year: no  Mismanaging finances: no  Remembering  appts:no  Daily problems with thinking and/or memory:no Ad8 score is=0   MMSE - Mini Mental State Exam 08/21/2017  Orientation to time 5  Orientation to Place 5  Registration 3  Attention/ Calculation 5  Recall 3  Language- name 2 objects 2  Language- repeat 1  Language- follow 3 step command 3  Language- read & follow direction 1  Write a sentence 1  Copy design 1  Total score 30        Immunization History  Administered Date(s) Administered  . Influenza Whole 09/05/2007, 08/13/2009, 08/27/2012  . Influenza, High Dose Seasonal PF 08/20/2018, 10/02/2019  . Influenza,inj,Quad PF,6+ Mos 08/25/2016  . Influenza-Unspecified 08/26/2013, 08/28/2015, 08/28/2017  . PPD Test 05/16/2011  . Pneumococcal Conjugate-13 05/17/2015  . Pneumococcal Polysaccharide-23 05/18/2011  . Tdap 05/16/2011    Screening Tests Health Maintenance  Topic Date Due  . TETANUS/TDAP  05/15/2021  . INFLUENZA VACCINE  Completed  . PNA vac Low Risk Adult  Completed       Plan:   See you next year!  Continue to eat heart healthy diet (full of fruits, vegetables, whole grains, lean protein, water--limit salt, fat, and sugar intake) and increase physical activity as tolerated.  Continue doing brain stimulating activities (puzzles, reading, adult coloring books, staying active) to keep memory sharp.    I have personally reviewed and noted the following in the patient's chart:   . Medical and social history . Use of alcohol, tobacco or illicit drugs  . Current medications and supplements . Functional ability and status . Nutritional status . Physical activity . Advanced directives . List of other physicians . Hospitalizations, surgeries, and ER visits in previous 12 months . Vitals . Screenings to include cognitive, depression, and falls . Referrals and appointments  In addition, I have reviewed and discussed with patient certain preventive protocols, quality metrics, and best practice  recommendations. A written personalized care plan for preventive services as well as general preventive health recommendations were provided to patient.     Shela Nevin, [place]  11/24/2019

## 2019-11-24 ENCOUNTER — Other Ambulatory Visit: Payer: Self-pay

## 2019-11-24 ENCOUNTER — Ambulatory Visit (INDEPENDENT_AMBULATORY_CARE_PROVIDER_SITE_OTHER): Payer: Medicare Other | Admitting: *Deleted

## 2019-11-24 ENCOUNTER — Encounter: Payer: Self-pay | Admitting: *Deleted

## 2019-11-24 DIAGNOSIS — Z0000 Encounter for general adult medical examination without abnormal findings: Secondary | ICD-10-CM

## 2019-11-24 NOTE — Patient Instructions (Signed)
See you next year!  Continue to eat heart healthy diet (full of fruits, vegetables, whole grains, lean protein, water--limit salt, fat, and sugar intake) and increase physical activity as tolerated.  Continue doing brain stimulating activities (puzzles, reading, adult coloring books, staying active) to keep memory sharp.    [name] , Thank you for taking time to come for your Medicare Wellness Visit. I appreciate your ongoing commitment to your health goals. Please review the following plan we discussed and let me know if I can assist you in the future.   These are the goals we discussed: Goals    . Maintain current health (pt-stated)       This is a list of the screening recommended for you and due dates:  Health Maintenance  Topic Date Due  . Tetanus Vaccine  05/15/2021  . Flu Shot  Completed  . Pneumonia vaccines  Completed    Preventive Care 33 Years and Older, Male Preventive care refers to lifestyle choices and visits with your health care provider that can promote health and wellness. This includes:  A yearly physical exam. This is also called an annual well check.  Regular dental and eye exams.  Immunizations.  Screening for certain conditions.  Healthy lifestyle choices, such as diet and exercise. What can I expect for my preventive care visit? Physical exam Your health care provider will check:  Height and weight. These may be used to calculate body mass index (BMI), which is a measurement that tells if you are at a healthy weight.  Heart rate and blood pressure.  Your skin for abnormal spots. Counseling Your health care provider may ask you questions about:  Alcohol, tobacco, and drug use.  Emotional well-being.  Home and relationship well-being.  Sexual activity.  Eating habits.  History of falls.  Memory and ability to understand (cognition).  Work and work Statistician. What immunizations do I need?  Influenza (flu) vaccine  This is  recommended every year. Tetanus, diphtheria, and pertussis (Tdap) vaccine  You may need a Td booster every 10 years. Varicella (chickenpox) vaccine  You may need this vaccine if you have not already been vaccinated. Zoster (shingles) vaccine  You may need this after age 69. Pneumococcal conjugate (PCV13) vaccine  One dose is recommended after age 50. Pneumococcal polysaccharide (PPSV23) vaccine  One dose is recommended after age 15. Measles, mumps, and rubella (MMR) vaccine  You may need at least one dose of MMR if you were born in 1957 or later. You may also need a second dose. Meningococcal conjugate (MenACWY) vaccine  You may need this if you have certain conditions. Hepatitis A vaccine  You may need this if you have certain conditions or if you travel or work in places where you may be exposed to hepatitis A. Hepatitis B vaccine  You may need this if you have certain conditions or if you travel or work in places where you may be exposed to hepatitis B. Haemophilus influenzae type b (Hib) vaccine  You may need this if you have certain conditions. You may receive vaccines as individual doses or as more than one vaccine together in one shot (combination vaccines). Talk with your health care provider about the risks and benefits of combination vaccines. What tests do I need? Blood tests  Lipid and cholesterol levels. These may be checked every 5 years, or more frequently depending on your overall health.  Hepatitis C test.  Hepatitis B test. Screening  Lung cancer screening. You may  have this screening every year starting at age 67 if you have a 30-pack-year history of smoking and currently smoke or have quit within the past 15 years.  Colorectal cancer screening. All adults should have this screening starting at age 60 and continuing until age 44. Your health care provider may recommend screening at age 37 if you are at increased risk. You will have tests every 1-10  years, depending on your results and the type of screening test.  Prostate cancer screening. Recommendations will vary depending on your family history and other risks.  Diabetes screening. This is done by checking your blood sugar (glucose) after you have not eaten for a while (fasting). You may have this done every 1-3 years.  Abdominal aortic aneurysm (AAA) screening. You may need this if you are a current or former smoker.  Sexually transmitted disease (STD) testing. Follow these instructions at home: Eating and drinking  Eat a diet that includes fresh fruits and vegetables, whole grains, lean protein, and low-fat dairy products. Limit your intake of foods with high amounts of sugar, saturated fats, and salt.  Take vitamin and mineral supplements as recommended by your health care provider.  Do not drink alcohol if your health care provider tells you not to drink.  If you drink alcohol: ? Limit how much you have to 0-2 drinks a day. ? Be aware of how much alcohol is in your drink. In the [place] one drink equals one 12 oz bottle of beer (355 mL), one 5 oz glass of wine (148 mL), or one 1 oz glass of hard liquor (44 mL). Lifestyle  Take daily care of your teeth and gums.  Stay active. Exercise for at least 30 minutes on 5 or more days each week.  Do not use any products that contain nicotine or tobacco, such as cigarettes, e-cigarettes, and chewing tobacco. If you need help quitting, ask your health care provider.  If you are sexually active, practice safe sex. Use a condom or other form of protection to prevent STIs (sexually transmitted infections).  Talk with your health care provider about taking a low-dose aspirin or statin. What's next?  Visit your health care provider once a year for a well check visit.  Ask your health care provider how often you should have your eyes and teeth checked.  Stay up to date on all vaccines. This information is not intended to replace  advice given to you by your health care provider. Make sure you discuss any questions you have with your health care provider. Document Released: 12/10/2015 Document Revised: 11/07/2018 Document Reviewed: 11/07/2018 Elsevier Patient Education  2020 [employer].

## 2019-11-26 ENCOUNTER — Other Ambulatory Visit: Payer: Self-pay

## 2019-11-26 ENCOUNTER — Ambulatory Visit (INDEPENDENT_AMBULATORY_CARE_PROVIDER_SITE_OTHER): Payer: Medicare Other | Admitting: Internal Medicine

## 2019-11-26 ENCOUNTER — Ambulatory Visit (HOSPITAL_BASED_OUTPATIENT_CLINIC_OR_DEPARTMENT_OTHER)
Admission: RE | Admit: 2019-11-26 | Discharge: 2019-11-26 | Disposition: A | Discharge status: Home / Self Care | Payer: Medicare Other | Source: Ambulatory Visit | Attending: Internal Medicine | Admitting: Internal Medicine

## 2019-11-26 VITALS — BP 124/54 | HR 62 | Temp 97.4°F | Resp 12 | Ht 74.0 in | Wt 159.2 lb

## 2019-11-26 DIAGNOSIS — M25552 Pain in left hip: Secondary | ICD-10-CM | POA: Diagnosis not present

## 2019-11-26 DIAGNOSIS — I25810 Atherosclerosis of coronary artery bypass graft(s) without angina pectoris: Secondary | ICD-10-CM

## 2019-11-26 DIAGNOSIS — I480 Paroxysmal atrial fibrillation: Secondary | ICD-10-CM

## 2019-11-26 MED ORDER — PREDNISONE 10 MG PO TABS: ORAL_TABLET | ORAL | 0 refills | Status: DC

## 2019-11-26 NOTE — Progress Notes (Signed)
Subjective:    Patient ID: [name], male    DOB: 1930-10-21, 85 y.o.   MRN: [id number]  DOS:  11/26/2019 Type of visit - description: Routine office visit In general feeling well except for pain. Last week developed some pain at the distal left back, some radiation to the left hip. At this point the pain is located mostly at the posterior aspect of the left hip and noticeable when he is stands and start walking. He is using a walker as a precaution to prevent falls.  Also wonder about side effect for Crestor, sometimes his joints "pop" and for years has occasional itching at different places.   Review of Systems No chest pain no difficulty breathing No lower extremity edema No cough  Past Medical History:  Diagnosis Date  . Anxiety   . Atrial fibrillation ([place])   . Blood transfusion without reported diagnosis   . CAD    a. 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  . Cataract   . Chronic abdominal pain   . Chronic nausea   . Coronary artery disease   . Diverticulosis 2011   Diverticulitis 2004  . Dyspepsia   . Esophageal reflux   . GERD (gastroesophageal reflux disease)   . Headache    saw neuro 4-16, MRI done  . HYPERPLASIA, PROSTATE NOS W/URINARY OBST/LUTS   . IBS   . INGUINAL HERNIA   . Lichen planus [id number]   [name]  . LUMBAR RADICULOPATHY, RIGHT   . Mixed hyperlipidemia   . OSTEOPOROSIS   . PONV (postoperative nausea and vomiting)   . PREMATURE VENTRICULAR CONTRACTIONS    a. After CABG - was on Coumadin/Multaq for a period of time. Coumadin discontinued 04/2010 after maintaining NSR.  [name] UNSPECIFIED ANEMIA     Past Surgical History:  Procedure Laterality Date  . BIOPSY  09/10/2018   Procedure: BIOPSY;  Surgeon: Jerene Bears, MD;  Location: [facility];  Service: Gastroenterology;;  . CATARACT EXTRACTION Right 10/06/2013  . CORONARY ARTERY BYPASS GRAFT     2 VD;anomalous  RCA;post op compliacted by AF  . ESOPHAGOGASTRODUODENOSCOPY (EGD) WITH PROPOFOL N/A 09/10/2018   Procedure: ESOPHAGOGASTRODUODENOSCOPY (EGD) WITH PROPOFOL;  Surgeon: Jerene Bears, MD;  Location: [facility];  Service: Gastroenterology;  Laterality: N/A;  . HIP FRACTURE SURGERY Right 03/2007   Dr. Salvadore Farber  . INGUINAL HERNIA REPAIR Left 2012   Dr Brantley Stage  . KNEE SURGERY Right 1977   repair  . PROSTATE BIOPSY  09/19/2013   [facility]    Social History   Socioeconomic History  . Marital status: Widowed    Spouse name: Not on file  . Number of children: 0  . Years of education: Not on file  . Highest education level: Not on file  Occupational History  . Occupation: retired    Fish farm manager: RETIRED  Tobacco Use  . Smoking status: Former Smoker    Types: Cigarettes    Quit date: 11/27/1962    Years since quitting: 57.0  . Smokeless tobacco: Never Used  Substance and Sexual Activity  . Alcohol use: Not Currently    Alcohol/week: 0.0 standard drinks    Comment: quit drinking   . Drug use: No  . Sexual activity: Not Currently  Other Topics Concern  . Not on file  Social History Narrative   Lives at [place] in independent living.     Lost  wife 06-2016.     Has no children.  Family: nephews-nices in [place]   Retired from [employer].     Still drives.    Social Determinants of Health   Financial Resource Strain:   . Difficulty of Paying Living Expenses: Not on file  Food Insecurity:   . Worried About Charity fundraiser in the Last Year: Not on file  . Ran Out of Food in the Last Year: Not on file  Transportation Needs:   . Lack of Transportation (Medical): Not on file  . Lack of Transportation (Non-Medical): Not on file  Physical Activity:   . Days of Exercise per Week: Not on file  . Minutes of Exercise per Session: Not on file  Stress:   . Feeling of Stress : Not on file  Social Connections:   . Frequency of Communication with Friends and Family: Not on  file  . Frequency of Social Gatherings with Friends and Family: Not on file  . Attends Religious Services: Not on file  . Active Member of Clubs or Organizations: Not on file  . Attends Archivist Meetings: Not on file  . Marital Status: Not on file  Intimate Partner Violence:   . Fear of Current or Ex-Partner: Not on file  . Emotionally Abused: Not on file  . Physically Abused: Not on file  . Sexually Abused: Not on file      Allergies as of 11/26/2019      Reactions   Amoxicillin Other (See Comments)   REACTION: n/v/d   Zoloft [sertraline Hcl]    Patient didn't like the way it made him feel   Remeron [mirtazapine] Other (See Comments)   Excessive Drowsiness      Medication List       Accurate as of November 26, 2019 10:03 AM. If you have any questions, ask your nurse or doctor.        acetaminophen 500 MG tablet Commonly known as: TYLENOL Take 500 mg by mouth every 6 (six) hours as needed for mild pain.   apixaban 5 MG Tabs tablet Commonly known as: Eliquis Take 1 tablet (5 mg total) by mouth 2 (two) times daily.   aspirin EC 81 MG tablet Take 81 mg by mouth daily.   cholecalciferol 1000 units tablet Commonly known as: VITAMIN D Take 1,000 Units by mouth every morning.   folic acid 1 MG tablet Commonly known as: FOLVITE TAKE ONE (1) TABLET BY MOUTH EACH DAY   loratadine 10 MG tablet Commonly known as: CLARITIN Take 0.5-1 tablets (5-10 mg total) by mouth daily as needed for allergies.   multivitamin with minerals Tabs tablet Take 1 tablet by mouth daily.   nitroGLYCERIN 0.4 MG SL tablet Commonly known as: NITROSTAT Place 1 tablet (0.4 mg total) under the tongue every 5 (five) minutes x 3 doses as needed for chest pain.   pantoprazole 40 MG tablet Commonly known as: PROTONIX Take one tablet by mouth twice daily 30 minutes before breakfast and dinner x 2 weeks and then once daily.   rosuvastatin 10 MG tablet Commonly known as: CRESTOR TAKE  ONE (1) TABLET BY MOUTH EACH DAY   TUMS PO Take 1 tablet by mouth as needed (heartburn). For calcium           Objective:   Physical Exam BP (!) 124/54 (BP Location: Right Arm, Cuff Size: Normal)   Pulse 62   Temp (!) 97.4 F (36.3 C) (Temporal)   Resp  12   Ht 6\' 2"  (1.88 m)   Wt 159 lb 3.2 oz (72.2 kg)   SpO2 100%   BMI 20.44 kg/m  General:   Well developed, NAD, BMI noted. HEENT:  Normocephalic . Face symmetric, atraumatic Lungs:  CTA B Normal respiratory effort, no intercostal retractions, no accessory muscle use. Heart: RRR,  no murmur.  No pretibial edema bilaterally  Skin: Not pale. Not jaundice MSK: No TTP at the lumbar spine. Hips: Rotation slightly limited in a symmetric fashion but not painful.  No TTP at the trochanteric bursa's Neurologic:  alert & oriented X3.  Speech normal, gait appropriate for age, assisted by a walker DTRs: Symmetrically decreased throughout. Psych--  Cognition and judgment appear intact.  Cooperative with normal attention span and concentration.  Behavior appropriate. No anxious or depressed appearing.      Assessment     Assessment Prediabetes, A1c 6.0 (2015) Hyperlipidemia Anxiety, depression:  -intol to zoloft, Rx lexapro 5 mg 07-2015: intolerant d/t nausea  -lost wife July 2017 CV: ---CAD--CABG 2011 ----P- Atrial fibrillation, PVCs.  Elliquis ; Multaq d/c d/t GI s/e ----Palpable Ao x years, [place] (-) for AAA 2005 COPD (per CXR 06-2015), PFTs 10-04-15 mild obstruction DJD  Gait-- uses a walker prn GI: --GERD, IBS, diverticulosis --Chronic nausea: 2019.  Work-up: 08-2018: Negative EGD, CT  and US abdomen. 12/2018 wnl Gastric empty stomach.  Symptoms decreased after Multaq stopped GU: elevated PSA, BPH , (-) bx 2014 Osteoporosis: -hip FX 2008 - dexa ~2005 --> rx fosamax, took x 1 year, dexa ~2010 was rec no fosamax at that time. Dexa 09-2015: T score -2.4 --> rx fosamax, d/c d/t jaw pain 06-2017; took prolia #25 February 2018;  T score (-) 3 February [id number], per Endo Lichen planus Headache -- saw neurology 02-2015, had a MRI/MRA (-)  PLAN High cholesterol: On Crestor, patient concerned about side effects, that is unlikely.  Patient reassured. CAD: Saw cardiology 09/12/2019, felt to be stable PAF: On Eliquis, not on Multaq due to nausea, not on  BBs due to bradycardia Left hip pain: Pain located around the left hip, denies radiation down the leg, no paresthesias,   likely hip related but radiculopathy is also in the DDx We will get a x-ray of the hip, needs an anti-inflammatory, prescribed round of prednisone, Tylenol, call if not better. Osteoporosis: per Endo RTC 6 months    This visit occurred during the SARS-CoV-2 public health emergency.  Safety protocols were in place, including screening questions prior to the visit, additional usage of staff PPE, and extensive cleaning of exam room while observing appropriate contact time as indicated for disinfecting solutions.

## 2019-11-26 NOTE — Patient Instructions (Signed)
  GO TO THE FRONT DESK Schedule your next appointment for a physical exam in 6 months  For pain at your hip:  Get x-ray, please go to the first floor  Tylenol  500 mg OTC 2 tabs a day every 8 hours as needed for pain  Take prednisone for few days as prescribed  Continue using your walker until you feel better.  If you are not gradually improving please call in 3 to 4 weeks for referral

## 2019-11-29 NOTE — Assessment & Plan Note (Signed)
High cholesterol: On Crestor, patient concerned about side effects, that is unlikely.  Patient reassured. CAD: Saw cardiology 09/12/2019, felt to be stable PAF: On Eliquis, not on Multaq due to nausea, not on  BBs due to bradycardia Left hip pain: Pain located around the left hip, denies radiation down the leg, no paresthesias,   likely hip related but radiculopathy is also in the DDx We will get a x-ray of the hip, needs an anti-inflammatory, prescribed round of prednisone, Tylenol, call if not better. Osteoporosis: per Endo RTC 6 months

## 2019-12-05 ENCOUNTER — Other Ambulatory Visit: Payer: Self-pay

## 2019-12-09 ENCOUNTER — Encounter: Payer: Self-pay | Admitting: Internal Medicine

## 2019-12-09 ENCOUNTER — Ambulatory Visit: Payer: Medicare Other | Admitting: Internal Medicine

## 2019-12-09 VITALS — BP 136/70 | HR 72 | Ht 74.0 in | Wt 157.0 lb

## 2019-12-09 DIAGNOSIS — M810 Age-related osteoporosis without current pathological fracture: Secondary | ICD-10-CM | POA: Diagnosis not present

## 2019-12-09 LAB — VITAMIN D 25 HYDROXY (VIT D DEFICIENCY, FRACTURES): VITD: 46.37 ng/mL (ref 30.00–100.00)

## 2019-12-09 NOTE — Progress Notes (Signed)
Patient ID: [name], male   DOB: 1930-01-27, 86 y.o.   MRN: [id number]   This visit occurred during the SARS-CoV-2 public health emergency.  Safety protocols were in place, including screening questions prior to the visit, additional usage of staff PPE, and extensive cleaning of exam room while observing appropriate contact time as indicated for disinfecting solutions.   HPI  WENDY GUO is a 86 y.o.-year-old pleasan male, initially referred by his PCP, Dr. Larose Kells, returning for follow-up osteoporosis.  Last visit 1 year ago.  He had increased pain in his L hip after Christmas >> X ray: OA.  Because of this, he had more acute instability and was not walking as much as before.  He now takes the elevator when going to his apartment, previously taking the stairs  He was diagnosed with osteoporosis in 2008.  I first saw the patient a year ago and at that time I suggested either Tymlos/Forteo or Romosozumab Field seismologist).  However, he did not decide for any of the medications yet.  Reviewed previous DXA scan reports: 01/22/2019 ([facility]) Lumbar spine L1-L4 Femoral neck (FN) 33% distal radius  T-score -2.2 LFN: -3.0 n/a  Change in BMD from previous DXA test (%) +1.4% -8.0%* n/a  (*) statistically significant  Date L1-L4 T score FN T score 33% distal Radius  10/14/2015 ([place]) -2.3 LFN: -2.6   10/04/2007 -2.5 RFN: -2.8 LFN: -2.3    Reviewed previous fractures: - vertebral compression fractures per review of the abd. CT report from 08/16/2018: moderate T8, mild T9, T10, moderate T12 compression fractures. He feels they developed after lifting heavy objects. - Right hip fracture 03/2008: "There is a spiral fracture of the proximal aspect of the right femur.  This begins in the intertrochanteric region and extends into the proximal femur.  There is a avulsion of the lesser trochanter.  The femoral head is located within the acetabulum. Degenerative changes are noted in the lower spine.  Bones  appear Osteopenic".  Osteopenia was detected on x ray of sacrum/coccyx 01/14/2016.  He denies dizziness, vertigo, orthostasis, poor vision.  Reviewed previous osteoporosis treatments and side effects: Fosamax - 2007-2009, then restarted in 2016-11/2017  - stopped as he developed jaw degeneration Prolia 12/2017 - 08/2018 - stopped as he developed jaw degeneration  No history of vitamin D deficiency.  Reviewed the available vitamin D levels: Lab Results  Component Value Date   VD25OH 50.45 12/09/2018   VD25OH 48 12/18/2011   VD25OH 52 12/14/2009   VD25OH 35 06/09/2009   He is on: - calcium (Tums) 2x a day - vit D 1000 units - MVI  He does weight bearing exercises - lives at [facility]  -he was going to the gym.   He does not take high vitamin A doses.  He has a family history of osteoporosis in one of his sisters and possibly also in his mother.  No history of hyper or hypocalcemia (except 1 calcium level 07/2018) or hyperparathyroidism.  No history of kidney stones. Lab Results  Component Value Date   CALCIUM 9.4 05/27/2019   CALCIUM 9.0 12/19/2018   CALCIUM 8.8 (L) 09/09/2018   CALCIUM 9.2 09/08/2018   CALCIUM 9.0 08/16/2018   CALCIUM 9.3 08/14/2018   CALCIUM 9.6 02/19/2018   CALCIUM 9.6 07/10/2017   CALCIUM 9.8 05/21/2017   CALCIUM 10.0 12/29/2016   No history of thyrotoxicosis: Lab Results  Component Value Date   TSH 2.44 08/14/2018   TSH 2.61 02/19/2018   TSH 2.36  08/08/2016   TSH 2.00 08/06/2014   TSH 1.37 01/08/2014   No CKD. Last BUN/Cr: Lab Results  Component Value Date   BUN 13 05/27/2019   CREATININE 0.77 05/27/2019   H/o CABG 2011.  He also has a history of A. fib.  ROS: Constitutional: no weight gain/no weight loss, no fatigue, no subjective hyperthermia, no subjective hypothermia Eyes: no blurry vision, no xerophthalmia ENT: no sore throat, no nodules palpated in neck, no dysphagia, no odynophagia, no hoarseness, +  tinnitus Cardiovascular: no CP/no SOB/no palpitations/no leg swelling Respiratory: no cough/no SOB/no wheezing Gastrointestinal: no N/no V/no D/no C/no acid reflux Musculoskeletal: no muscle aches/+ joint aches Skin: no rashes, no hair loss, + easy bruising Neurological: no tremors/no numbness/no tingling/no dizziness  I reviewed pt's medications, allergies, PMH, social hx, family hx, and changes were documented in the history of present illness. Otherwise, unchanged from my initial visit note.  Past Medical History:  Diagnosis Date  . Anxiety   . Atrial fibrillation ([place])   . Blood transfusion without reported diagnosis   . CAD    a. 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  . Cataract   . Chronic abdominal pain   . Chronic nausea   . Coronary artery disease   . Diverticulosis 2011   Diverticulitis 2004  . Dyspepsia   . Esophageal reflux   . GERD (gastroesophageal reflux disease)   . Headache    saw neuro 4-16, MRI done  . HYPERPLASIA, PROSTATE NOS W/URINARY OBST/LUTS   . IBS   . INGUINAL HERNIA   . Lichen planus [id number]   [name]  . LUMBAR RADICULOPATHY, RIGHT   . Mixed hyperlipidemia   . OSTEOPOROSIS   . PONV (postoperative nausea and vomiting)   . PREMATURE VENTRICULAR CONTRACTIONS    a. After CABG - was on Coumadin/Multaq for a period of time. Coumadin discontinued 04/2010 after maintaining NSR.  [name] UNSPECIFIED ANEMIA    Past Surgical History:  Procedure Laterality Date  . BIOPSY  09/10/2018   Procedure: BIOPSY;  Surgeon: Jerene Bears, MD;  Location: [facility];  Service: Gastroenterology;;  . CATARACT EXTRACTION Right 10/06/2013  . CORONARY ARTERY BYPASS GRAFT     2 VD;anomalous RCA;post op compliacted by AF  . ESOPHAGOGASTRODUODENOSCOPY (EGD) WITH PROPOFOL N/A 09/10/2018   Procedure: ESOPHAGOGASTRODUODENOSCOPY (EGD) WITH PROPOFOL;  Surgeon: Jerene Bears, MD;  Location: [facility];   Service: Gastroenterology;  Laterality: N/A;  . HIP FRACTURE SURGERY Right 03/2007   Dr. Salvadore Farber  . INGUINAL HERNIA REPAIR Left 2012   Dr Brantley Stage  . KNEE SURGERY Right 1977   repair  . PROSTATE BIOPSY  09/19/2013   [facility]   Social History   Socioeconomic History  . Marital status: Widowed    Spouse name: Not on file  . Number of children: 0  . Years of education: Not on file  . Highest education level: Not on file  Occupational History  . Occupation: retired    Fish farm manager: RETIRED  Tobacco Use  . Smoking status: Former Smoker    Types: Cigarettes    Quit date: 11/27/1962    Years since quitting: 57.0  . Smokeless tobacco: Never Used  Substance and Sexual Activity  . Alcohol use: Not Currently    Alcohol/week: 0.0 standard drinks    Comment: quit drinking   . Drug use: No  . Sexual activity: Not Currently  Other Topics  Concern  . Not on file  Social History Narrative   Lives at [place] in independent living.     Lost wife 06-2016.     Has no children.  Family: nephews-nices in [place]   Retired from [employer].     Still drives.    Social Determinants of Health   Financial Resource Strain:   . Difficulty of Paying Living Expenses: Not on file  Food Insecurity:   . Worried About Charity fundraiser in the Last Year: Not on file  . Ran Out of Food in the Last Year: Not on file  Transportation Needs:   . Lack of Transportation (Medical): Not on file  . Lack of Transportation (Non-Medical): Not on file  Physical Activity:   . Days of Exercise per Week: Not on file  . Minutes of Exercise per Session: Not on file  Stress:   . Feeling of Stress : Not on file  Social Connections:   . Frequency of Communication with Friends and Family: Not on file  . Frequency of Social Gatherings with Friends and Family: Not on file  . Attends Religious Services: Not on file  . Active Member of Clubs or Organizations: Not on file  . Attends Archivist  Meetings: Not on file  . Marital Status: Not on file  Intimate Partner Violence:   . Fear of Current or Ex-Partner: Not on file  . Emotionally Abused: Not on file  . Physically Abused: Not on file  . Sexually Abused: Not on file   Current Outpatient Medications on File Prior to Visit  Medication Sig Dispense Refill  . acetaminophen (TYLENOL) 500 MG tablet Take 500 mg by mouth every 6 (six) hours as needed for mild pain.    [name] apixaban (ELIQUIS) 5 MG TABS tablet Take 1 tablet (5 mg total) by mouth 2 (two) times daily. 60 tablet 5  . aspirin EC 81 MG tablet Take 81 mg by mouth daily.    . Calcium Carbonate Antacid (TUMS PO) Take 1 tablet by mouth as needed (heartburn). For calcium     . cholecalciferol (VITAMIN D) 1000 UNITS tablet Take 1,000 Units by mouth every morning.      . folic acid (FOLVITE) 1 MG tablet TAKE ONE (1) TABLET BY MOUTH EACH DAY 30 tablet 11  . loratadine (CLARITIN) 10 MG tablet Take 0.5-1 tablets (5-10 mg total) by mouth daily as needed for allergies. 30 tablet 5  . Multiple Vitamin (MULITIVITAMIN WITH MINERALS) TABS Take 1 tablet by mouth daily.      . nitroGLYCERIN (NITROSTAT) 0.4 MG SL tablet Place 1 tablet (0.4 mg total) under the tongue every 5 (five) minutes x 3 doses as needed for chest pain. 30 tablet 2  . pantoprazole (PROTONIX) 40 MG tablet Take one tablet by mouth twice daily 30 minutes before breakfast and dinner x 2 weeks and then once daily. 44 tablet 0  . predniSONE (DELTASONE) 10 MG tablet 3 tabs x 3 days, 2 tabs x 3 days, 1 tab x 3 days 18 tablet 0  . rosuvastatin (CRESTOR) 10 MG tablet TAKE ONE (1) TABLET BY MOUTH EACH DAY 90 tablet 1   No current facility-administered medications on file prior to visit.   Allergies  Allergen Reactions  . Amoxicillin Other (See Comments)    REACTION: n/v/d  . Zoloft [Sertraline Hcl]     Patient didn't like the way it made him feel  . Remeron [Mirtazapine] Other (See Comments)  Excessive Drowsiness   Family  History  Problem Relation Age of Onset  . Stroke Mother   . Pancreatic cancer Father        Deceased, 81  . Breast cancer Sister        Deceased  . Heart disease Sister 36  . Stroke Maternal Aunt   . Colon cancer Neg Hx   . Prostate cancer Neg Hx   . Esophageal cancer Neg Hx   . Rectal cancer Neg Hx   . Stomach cancer Neg Hx     PE: BP 136/70   Pulse 72   Ht 6\' 2"  (1.88 m)   Wt 157 lb (71.2 kg)   SpO2 96%   BMI 20.16 kg/m  Wt Readings from Last 3 Encounters:  12/09/19 157 lb (71.2 kg)  11/26/19 159 lb 3.2 oz (72.2 kg)  09/12/19 160 lb (72.6 kg)   Constitutional: thin,, in NAD Eyes: PERRLA, EOMI, no exophthalmos ENT: moist mucous membranes, no thyromegaly, no cervical lymphadenopathy Cardiovascular: RRR, No MRG Respiratory: CTA B Gastrointestinal: abdomen soft, NT, ND, BS+ Musculoskeletal: no deformities, strength intact in all 4 Skin: moist, warm, no rashes Neurological: no tremor with outstretched hands, DTR normal in all 4   Assessment: 1. Osteoporosis  Plan: 1. Osteoporosis -Likely age-related and also has family history of osteoporosis -He has an increased risk for fracture, due to previous history of fractures and also T score is lower than -2.5.  We reviewed together his latest bone density scan which showed worse T-scores at the femoral neck.  He has lumbar compression fractures which he thinks he developed from lifting buckets of water for his garden.  He also has a history of right hip fracture, which, from the description in the report appears to have been a difficult, osteoporotic, fracture.  However, when he developed this he was on Fosamax.  His orthopedic doctor advised him to stop Fosamax and he will was off for many years but this was restarted after the latest bone density was in the osteoporotic range.  Afterwards, he developed jaw "degeneration" after starting back on Fosamax (most likely not ONJ as today I showed him pictures of ONJ and he adamantly  denies having had anything similar -Per his description, his jaw was locking up while on the medication-I explained that this may be 2/2 osteoarthritis but unlikely to be due to the medication). He was switched to Prolia but he again developed jaw "degeneration" while on this in the region.  He was wondering at last visit whether he absolutely needs another medication for his osteoporosis. -At that time, we discussed about calcium and vitamin D intake and I recommended to make sure he gets 1000 to 1200 mg of calcium daily preferentially from the diet (he eats really vegetables and also drinks almond milk).  At that time, the vitamin D level was normal so we continued his supplementation with 1000 units vitamin D daily + the amount in the multivitamin.  He continued his calcium supplementation with Tums 1-2x a day. -At last visit (again today) we also reviewed together different medication classes with possible benefits and side effects (including atypical fractures and ONJ).  He had an osteoporosis fracture (not an atypical one), so for this, Fosamax and Prolia would not be contraindicated.  Also, his jaw degeneration was not actually ONJ per his description.  He was reticent after last visit to start teriparatide/abaloparatide (which are also not FDA approved for men) or Romosozumab.  At this visit, after clarifying  his previous jaw problem on Prolia, we decided to give this another try.  I advised him that we can continue with Prolia for at least 10 years.  Afterwards, we need to switch to another medication. - No dental work now, but had dental surgery lat year -Unfortunately, he is now limiting exercise due to his hip pain and we discussed about the benefits of exercise even with the weight.  At today's visit, I gave him a prescription to have physical therapy at the assisted living facility -We discussed fall precautions.  This is paramount for him.  He did not have any falls since last visit.  He has a  walker but not using it. -We will check a vitamin D level today along with a BMP. -He is due for another bone density scan a year from now.  We will order this at next visit -I will see him back in 1 year, but he will return to the office sooner for the Prolia injections  - time spent with the patient: 40 minutes, of which >50% was spent in obtaining information about his symptoms, reviewing his previous labs, DXA scan reports and images, evaluations, and treatments, counseling him about his condition (please see the discussed topics above), and developing a plan to further investigate and treat it; he had a number of questions which I addressed.  Component     Latest Ref Rng & Units 12/09/2019  Glucose     65 - 99 mg/dL 87  BUN     7 - 25 mg/dL 18  Creatinine     0.70 - 1.11 mg/dL 0.76  GFR, Est Non African American     > OR = 60 mL/min/1.48m2 82  GFR, Est African American     > OR = 60 mL/min/1.60m2 95  BUN/Creatinine Ratio     6 - 22 (calc) NOT APPLICABLE  Sodium     [id number] - 146 mmol/L 134 (L)  Potassium     3.5 - 5.3 mmol/L 5.5 (H)  Chloride     98 - 110 mmol/L 98  CO2     20 - 32 mmol/L 27  Calcium     8.6 - 10.3 mg/dL 10.0  VITD     30.00 - 100.00 ng/mL 46.37   Vitamin D and kidney function normal. He has a slightly low sodium and a slightly high potassium. He has had high potassium in the past. I will advised him to discuss with PCP about this. We can start the preauthorization for Prolia.  Philemon Kingdom, MD PhD [facility]

## 2019-12-09 NOTE — Patient Instructions (Signed)
Please stop at the lab.  Continue vitamin D 1000 units daily and the multivitamin.  We will let you know when you can come for Prolia injection.  Please come back for a follow-up appointment in 1 year.

## 2019-12-10 LAB — BASIC METABOLIC PANEL WITH GFR
BUN: 18 mg/dL (ref 7–25)
CO2: 27 mmol/L (ref 20–32)
Calcium: 10 mg/dL (ref 8.6–10.3)
Chloride: 98 mmol/L (ref 98–110)
Creat: 0.76 mg/dL (ref 0.70–1.11)
GFR, Est African American: 95 mL/min/{1.73_m2} (ref >=60)
GFR, Est Non African American: 82 mL/min/{1.73_m2} (ref >=60)
Glucose, Bld: 87 mg/dL (ref 65–99)
Potassium: 5.5 mmol/L — ABNORMAL HIGH (ref 3.5–5.3)
Sodium: 134 mmol/L — ABNORMAL LOW (ref 135–146)

## 2019-12-12 ENCOUNTER — Telehealth: Payer: Self-pay

## 2019-12-12 NOTE — Telephone Encounter (Signed)
-----   Message from Philemon Kingdom, MD sent at 12/11/2019  4:36 PM EST ----- Lenna Sciara, can you please call pt:  Vitamin D and kidney function are normal. He has a slightly low sodium and a slightly high potassium. He has had high potassium in the past. Please advise him to discuss with PCP about this.   Lattie Haw, Can you please submit him to the Prolia portal?  Thank you, C

## 2019-12-15 NOTE — Telephone Encounter (Signed)
Left message for patient to return our call at [phone].  

## 2019-12-15 NOTE — Telephone Encounter (Signed)
Notified patient of message from Dr. Gherghe, patient expressed understanding and agreement. No further questions.  

## 2019-12-17 ENCOUNTER — Telehealth: Payer: Self-pay | Admitting: Cardiovascular Disease

## 2019-12-17 ENCOUNTER — Encounter: Payer: Self-pay | Admitting: Internal Medicine

## 2019-12-17 ENCOUNTER — Other Ambulatory Visit: Payer: Self-pay

## 2019-12-17 ENCOUNTER — Ambulatory Visit (INDEPENDENT_AMBULATORY_CARE_PROVIDER_SITE_OTHER): Payer: Medicare Other | Admitting: Internal Medicine

## 2019-12-17 VITALS — BP 146/72 | HR 79 | Temp 96.5°F | Resp 16 | Ht 74.0 in | Wt 159.0 lb

## 2019-12-17 DIAGNOSIS — L570 Actinic keratosis: Secondary | ICD-10-CM | POA: Diagnosis not present

## 2019-12-17 DIAGNOSIS — C44319 Basal cell carcinoma of skin of other parts of face: Secondary | ICD-10-CM | POA: Diagnosis not present

## 2019-12-17 DIAGNOSIS — I4510 Unspecified right bundle-branch block: Secondary | ICD-10-CM

## 2019-12-17 DIAGNOSIS — B351 Tinea unguium: Secondary | ICD-10-CM | POA: Diagnosis not present

## 2019-12-17 DIAGNOSIS — E875 Hyperkalemia: Secondary | ICD-10-CM

## 2019-12-17 DIAGNOSIS — D1801 Hemangioma of skin and subcutaneous tissue: Secondary | ICD-10-CM | POA: Diagnosis not present

## 2019-12-17 DIAGNOSIS — L821 Other seborrheic keratosis: Secondary | ICD-10-CM | POA: Diagnosis not present

## 2019-12-17 DIAGNOSIS — Z85828 Personal history of other malignant neoplasm of skin: Secondary | ICD-10-CM | POA: Diagnosis not present

## 2019-12-17 LAB — BASIC METABOLIC PANEL
BUN: 18 mg/dL (ref 6–23)
CO2: 29 mEq/L (ref 19–32)
Calcium: 9.7 mg/dL (ref 8.4–10.5)
Chloride: 99 mEq/L (ref 96–112)
Creatinine, Ser: 0.76 mg/dL (ref 0.40–1.50)
GFR: 96.97 mL/min (ref >60.00)
Glucose, Bld: 88 mg/dL (ref 70–99)
Potassium: 5.2 mEq/L — ABNORMAL HIGH (ref 3.5–5.1)
Sodium: 135 mEq/L (ref 135–145)

## 2019-12-17 NOTE — Progress Notes (Signed)
Pre visit review using our clinic review tool, if applicable. No additional management support is needed unless otherwise documented below in the visit note. 

## 2019-12-17 NOTE — Patient Instructions (Signed)
  GO TO THE LAB : Get the blood work     

## 2019-12-17 NOTE — Progress Notes (Signed)
Subjective:    Patient ID: [name], male    DOB: 1930-05-16, 86 y.o.   MRN: [id number]  DOS:  12/17/2019 Type of visit - description: Acute Recently went to endocrinology on routine blood work show hyperkalemia. Medications and supplements reviewed.  Also, was seen with hip pain the last time he was here, he is now better, asymptomatic.  Denies chest pain, difficulty breathing.  No palpitations. No lower extremity edema No nausea, vomiting, diarrhea    Review of Systems See above   Past Medical History:  Diagnosis Date  . Anxiety   . Atrial fibrillation ([place])   . Blood transfusion without reported diagnosis   . CAD    a. 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  . Cataract   . Chronic abdominal pain   . Chronic nausea   . Coronary artery disease   . Diverticulosis 2011   Diverticulitis 2004  . Dyspepsia   . Esophageal reflux   . GERD (gastroesophageal reflux disease)   . Headache    saw neuro 4-16, MRI done  . HYPERPLASIA, PROSTATE NOS W/URINARY OBST/LUTS   . IBS   . INGUINAL HERNIA   . Lichen planus [id number]   [name]  . LUMBAR RADICULOPATHY, RIGHT   . Mixed hyperlipidemia   . OSTEOPOROSIS   . PONV (postoperative nausea and vomiting)   . PREMATURE VENTRICULAR CONTRACTIONS    a. After CABG - was on Coumadin/Multaq for a period of time. Coumadin discontinued 04/2010 after maintaining NSR.  [name] UNSPECIFIED ANEMIA     Past Surgical History:  Procedure Laterality Date  . BIOPSY  09/10/2018   Procedure: BIOPSY;  Surgeon: Jerene Bears, MD;  Location: [facility];  Service: Gastroenterology;;  . CATARACT EXTRACTION Right 10/06/2013  . CORONARY ARTERY BYPASS GRAFT     2 VD;anomalous RCA;post op compliacted by AF  . ESOPHAGOGASTRODUODENOSCOPY (EGD) WITH PROPOFOL N/A 09/10/2018   Procedure: ESOPHAGOGASTRODUODENOSCOPY (EGD) WITH PROPOFOL;  Surgeon: Jerene Bears, MD;  Location: [facility];  Service: Gastroenterology;  Laterality: N/A;  . HIP FRACTURE SURGERY Right 03/2007   Dr. Salvadore Farber  . INGUINAL HERNIA REPAIR Left 2012   Dr Brantley Stage  . KNEE SURGERY Right 1977   repair  . PROSTATE BIOPSY  09/19/2013   [facility]        Objective:   Physical Exam BP (!) 146/72 (BP Location: Left Arm, Patient Position: Sitting, Cuff Size: Small)   Pulse 79   Temp (!) 96.5 F (35.8 C) (Temporal)   Resp 16   Ht 6\' 2"  (1.88 m)   Wt 159 lb (72.1 kg)   SpO2 99%   BMI 20.41 kg/m  General:   Well developed, NAD, BMI noted. HEENT:  Normocephalic . Face symmetric, atraumatic Lungs:  CTA B Normal respiratory effort, no intercostal retractions, no accessory muscle use. Heart: RRR,  no murmur.  No pretibial edema bilaterally  Skin: Not pale. Not jaundice Neurologic:  alert & oriented X3.  Speech normal, gait appropriate for age and unassisted Psych--  Cognition and judgment appear intact.  Cooperative with normal attention span and concentration.  Behavior appropriate. No anxious or depressed appearing.      Assessment     Assessment Prediabetes, A1c 6.0 (2015) Hyperlipidemia Anxiety, depression:  -intol to zoloft, Rx lexapro 5 mg 07-2015: intolerant d/t nausea  -lost wife July 2017 CV: ---CAD--CABG 2011 ----P- Atrial fibrillation, PVCs.  Elliquis ; Multaq d/c d/t GI s/e ----Palpable Ao x years, [place] (-) for AAA 2005 COPD (per CXR 06-2015), PFTs 10-04-15 mild obstruction DJD  Gait-- uses a walker prn GI: --GERD, IBS, diverticulosis --Chronic nausea: 2019.  Work-up: 08-2018: Negative EGD, CT  and US abdomen. 12/2018 wnl Gastric empty stomach.  Symptoms decreased after Multaq stopped GU: elevated PSA, BPH , (-) bx 2014 Osteoporosis: -hip FX 2008 - dexa ~2005 --> rx fosamax, took x 1 year, dexa ~2010 was rec no fosamax at that time. Dexa 09-2015: T score -2.4 --> rx fosamax, d/c d/t jaw pain 06-2017; took prolia #25 February 2018; T score (-) 3 February [id number], per  Endo Lichen planus Headache -- saw neurology 02-2015, had a MRI/MRA (-)  PLAN Hyperkalemia: Not on any medication that could account for elevated potassium.  He does have a diet rich on fruits and vegetables.  Not on any salt substitutes. Plan: BMP EKG today: RBBB, previously IRBBB (reviewed with cardiology).  Observation Hip pain: See last visit, x-rays show osteopenia but no acute changes, pain resolved after a round of steroids RTC already scheduled for 7-20 21   This visit occurred during the SARS-CoV-2 public health emergency.  Safety protocols were in place, including screening questions prior to the visit, additional usage of staff PPE, and extensive cleaning of exam room while observing appropriate contact time as indicated for disinfecting solutions.

## 2019-12-17 NOTE — Telephone Encounter (Signed)
Dr. Meda Coffee spoke with Dr. Larose Kells about this pt.  This has been addressed.

## 2019-12-17 NOTE — Telephone Encounter (Signed)
New Message    Dr Larose Kells is calling to speak with Dr Johnsie Cancel or DOD    Please call back

## 2019-12-18 NOTE — Assessment & Plan Note (Signed)
Hyperkalemia: Not on any medication that could account for elevated potassium.  He does have a diet rich on fruits and vegetables.  Not on any salt substitutes. Plan: BMP EKG today: RBBB, previously IRBBB (reviewed with cardiology).  Observation Hip pain: See last visit, x-rays show osteopenia but no acute changes, pain resolved after a round of steroids RTC already scheduled for 7-20 21

## 2019-12-19 MED ORDER — PANTOPRAZOLE SODIUM 40 MG PO TBEC: 40.0000 mg | DELAYED_RELEASE_TABLET | Freq: Every day | ORAL | 3 refills | Status: DC

## 2019-12-19 NOTE — Addendum Note (Signed)
Addended byDamita Dunnings D on: 12/19/2019 10:38 AM   Modules accepted: Orders

## 2019-12-31 DIAGNOSIS — C44319 Basal cell carcinoma of skin of other parts of face: Secondary | ICD-10-CM | POA: Diagnosis not present

## 2020-01-06 ENCOUNTER — Other Ambulatory Visit: Payer: Self-pay | Admitting: Cardiovascular Disease

## 2020-02-01 IMAGING — DX DG HIP (WITH OR WITHOUT PELVIS) 2-3V*L*
3 series · 3 of 3 positions shown · non-contrast
Comparison: None.

CLINICAL DATA: Left hip pain for 1 week, no known injury, initial
encounter

EXAM:
DG HIP (WITH OR WITHOUT PELVIS) 3V LEFT

[pelvis ap]
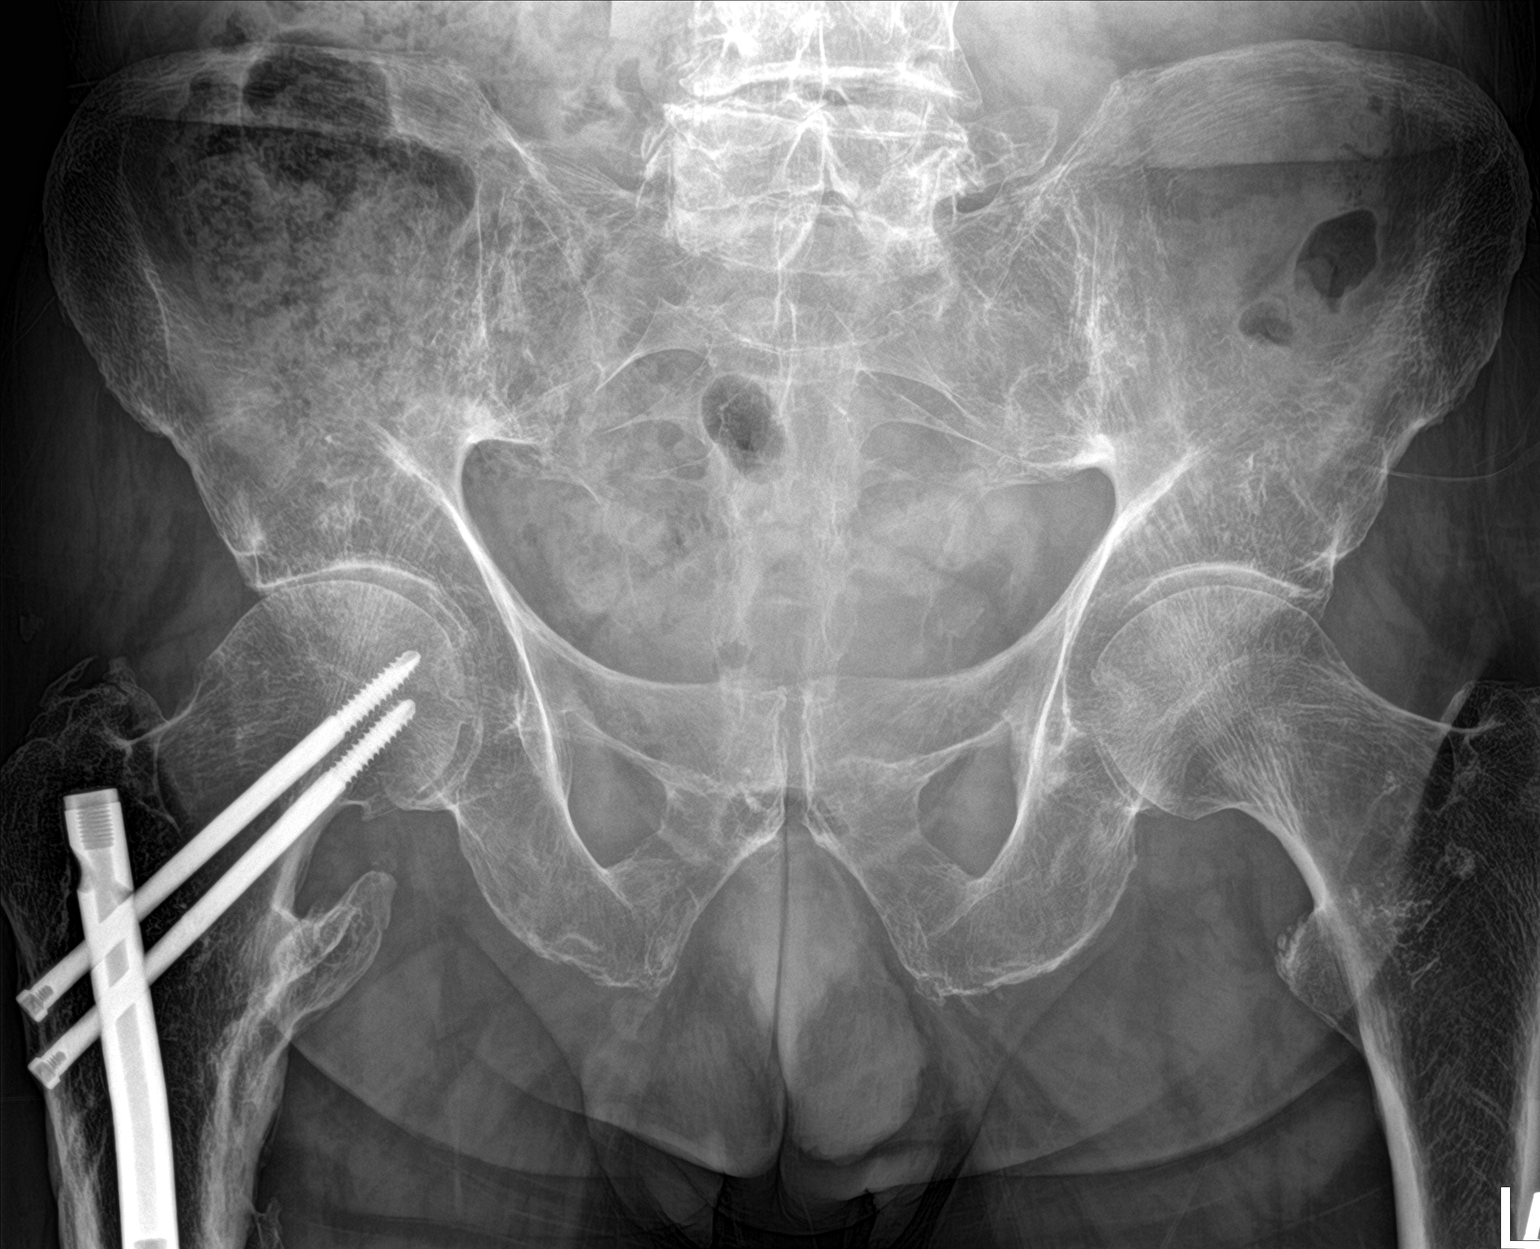

[hip ap]
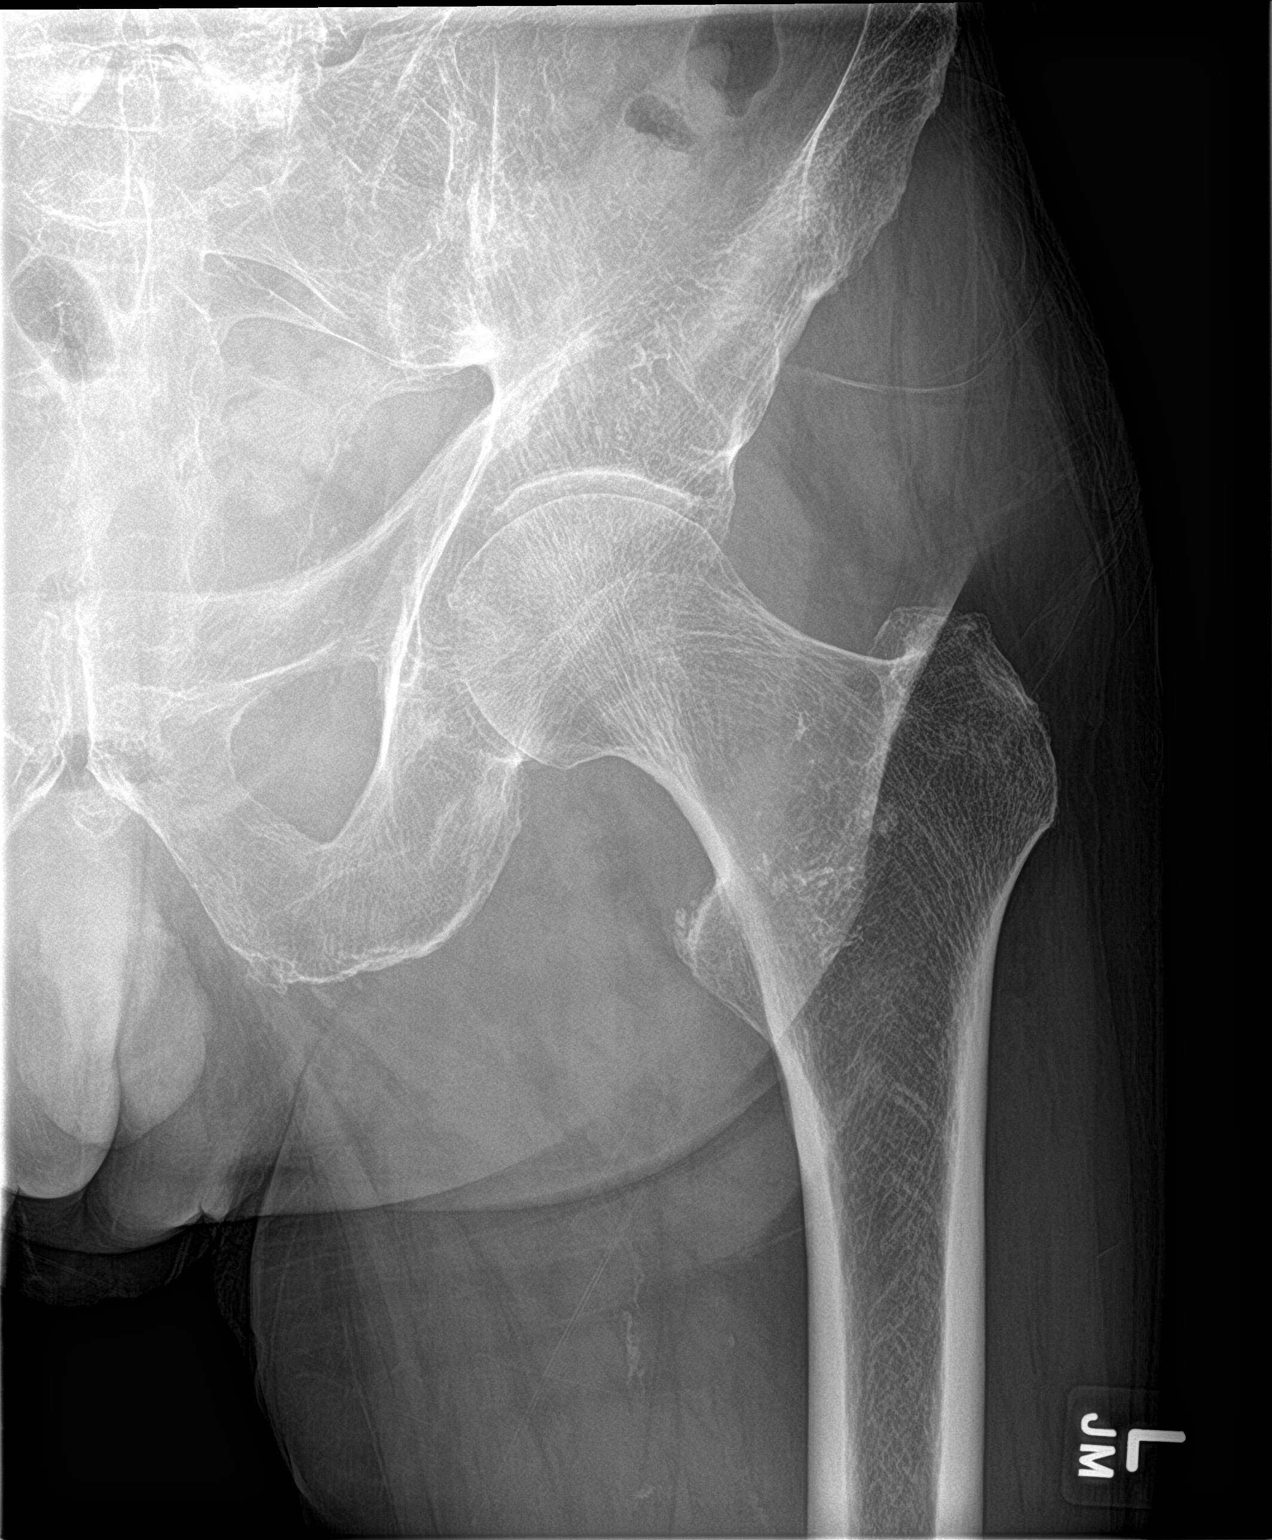

[hip lat]
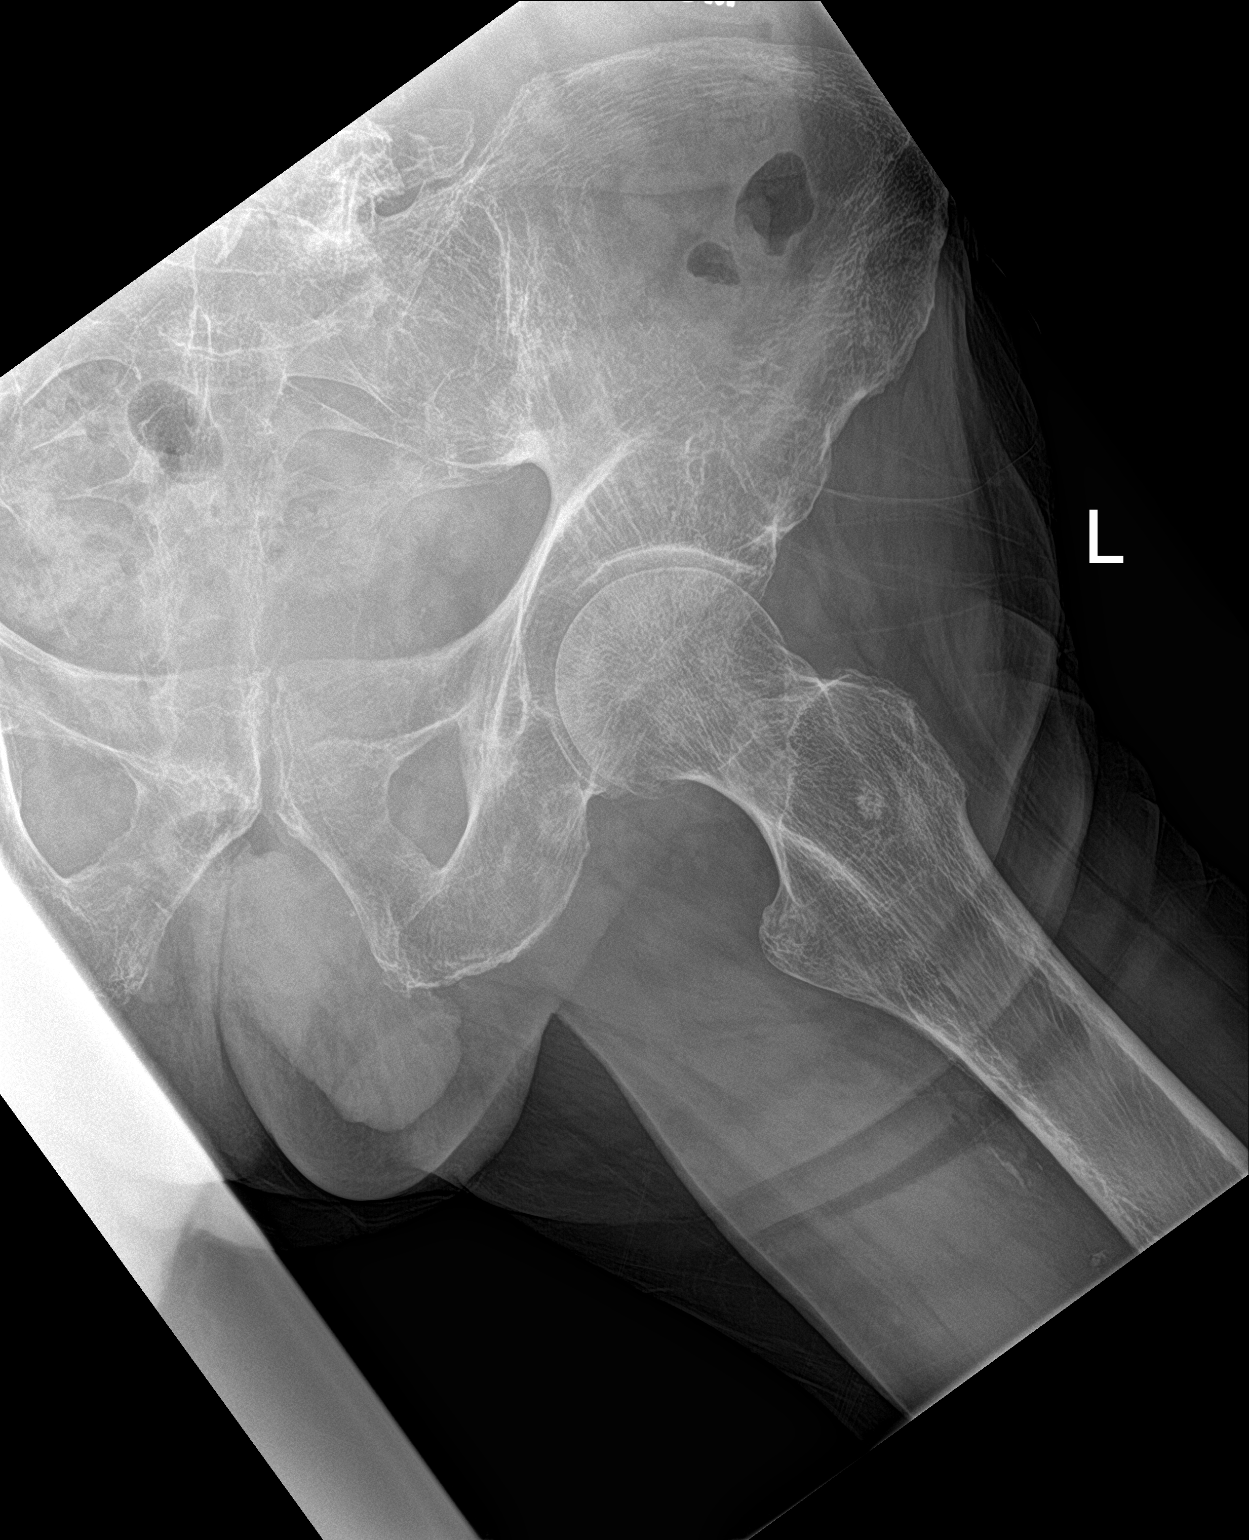

[3 of 3 positions shown; findings below may reference images not displayed]

FINDINGS: Postsurgical changes are noted in the proximal right femur. Pelvic
ring is intact. No acute fracture or dislocation is seen.
Generalized osteopenia is noted. Degenerative changes in the lower
lumbar spine are seen.
IMPRESSION: Generalized osteopenia.  No acute abnormality noted.

## 2020-03-08 ENCOUNTER — Other Ambulatory Visit: Payer: Self-pay | Admitting: Cardiovascular Disease

## 2020-03-08 NOTE — Telephone Encounter (Signed)
Prescription refill request for Eliquis received.  Last office visit: Nishan, 09/12/2019 Scr: 0.76, 12/17/2019 Age: 86 y.o. Weight: 72.1 kg   Prescription refill sent.

## 2020-03-16 NOTE — Progress Notes (Signed)
Patient ID: [name], male   DOB: Jun 21, 1930, 86 y.o.   MRN: [id number]     86 y.o. with CABG 2011 non ischemic myovue 09/08/15. PAF post CABG and recurred 2015 with GI illness and placed on Eliquis and Multaq. History of anemia improved with Folate. Wife passed in 2017 and he is at [facility] now. Tends toward bradycardia and Toprol stopped in 2015  In ER 08/16/18 with epigastric pain with vomiting. CT abdomen no acute findings multaq stopped ? Contributing to nausea. Not clear that AAT contributed  Myovue 08/27/18 non ischemic  Seen in ER Sunday for chest pain GI overtones given Protonix R/O Still feels uneasy in chest    ROS: Denies fever, malais, weight loss, blurry vision, decreased visual acuity, cough, sputum, SOB, hemoptysis, pleuritic pain, palpitaitons, heartburn, abdominal pain, melena, lower extremity edema, claudication, or rash.  All other systems reviewed and negative  General: BP 128/64   Pulse (!) 57   Ht 6\' 2"  (1.88 m)   Wt 158 lb (71.7 kg)   SpO2 98%   BMI 20.29 kg/m  Affect appropriate Healthy:  appears stated age 52: normal Neck supple with no adenopathy JVP normal no bruits no thyromegaly Lungs clear with no wheezing and good diaphragmatic motion Heart:  S1/S2 no murmur, no rub, gallop or click PMI normal post sternotomy  Abdomen: benighn, BS positve, no tenderness, no AAA no bruit.  No HSM or HJR Distal pulses intact with no bruits No edema Neuro non-focal Skin warm and dry No muscular weakness    Current Outpatient Medications  Medication Sig Dispense Refill  . acetaminophen (TYLENOL) 500 MG tablet Take 500 mg by mouth every 6 (six) hours as needed for mild pain.    [name] aspirin EC 81 MG tablet Take 81 mg by mouth daily.    . Calcium Carbonate Antacid (TUMS PO) Take 1 tablet by mouth as needed (heartburn). For calcium     . cholecalciferol (VITAMIN D) 1000 UNITS tablet Take 1,000 Units by mouth every morning.      [name] ELIQUIS 5 MG TABS tablet  TAKE ONE (1) TABLET BY MOUTH TWO (2) TIMES DAILY 60 tablet 5  . folic acid (FOLVITE) 1 MG tablet TAKE ONE (1) TABLET BY MOUTH EACH DAY 30 tablet 11  . loratadine (CLARITIN) 10 MG tablet Take 0.5-1 tablets (5-10 mg total) by mouth daily as needed for allergies. 30 tablet 5  . Multiple Vitamin (MULITIVITAMIN WITH MINERALS) TABS Take 1 tablet by mouth daily.      . nitroGLYCERIN (NITROSTAT) 0.4 MG SL tablet Place 1 tablet (0.4 mg total) under the tongue every 5 (five) minutes x 3 doses as needed for chest pain. (Patient not taking: Reported on 12/17/2019) 30 tablet 2  . pantoprazole (PROTONIX) 40 MG tablet Take 1 tablet (40 mg total) by mouth daily before breakfast. 90 tablet 3  . rosuvastatin (CRESTOR) 10 MG tablet TAKE 1 TABLET BY MOUTH DAILY 90 tablet 3   No current facility-administered medications for this visit.    Allergies  Amoxicillin, Zoloft [sertraline hcl], and Remeron [mirtazapine]  Electrocardiogram:  4/15  SB rate 45  ICRBBB  Toprol stopped  09/26/17 SR rate 54 ICRBBB   Assessment and Plan CAD/CABG:  2011   Normal myovue 08/27/18 EF 62% SSCP atypical f/u Lexiscan myovue New nitro called in  Add Pepcid complete to meds   PAF: Maint NSR no palpitaitons on eliquis Multaq d/c due to nausea No beta blockers due to bradycardia  GERD  continue protonix concern for weight loss CT abdomen negative FU GI    Chol at goal continue crestor   Osteoporosis:  Active on fosamax f/u primary   F/U with cardiology in a year if myovue low risk   Jenkins Rouge MD [facility]

## 2020-03-21 ENCOUNTER — Emergency Department (HOSPITAL_BASED_OUTPATIENT_CLINIC_OR_DEPARTMENT_OTHER)
Admission: EM | Admit: 2020-03-21 | Discharge: 2020-03-21 | Disposition: A | Discharge status: Home / Self Care | Payer: Medicare Other | Attending: Emergency Medicine | Admitting: Emergency Medicine

## 2020-03-21 ENCOUNTER — Encounter (HOSPITAL_BASED_OUTPATIENT_CLINIC_OR_DEPARTMENT_OTHER): Payer: Self-pay | Admitting: Emergency Medicine

## 2020-03-21 DIAGNOSIS — Z7982 Long term (current) use of aspirin: Secondary | ICD-10-CM | POA: Insufficient documentation

## 2020-03-21 DIAGNOSIS — I2510 Atherosclerotic heart disease of native coronary artery without angina pectoris: Secondary | ICD-10-CM | POA: Insufficient documentation

## 2020-03-21 DIAGNOSIS — E162 Hypoglycemia, unspecified: Secondary | ICD-10-CM | POA: Diagnosis not present

## 2020-03-21 DIAGNOSIS — E161 Other hypoglycemia: Secondary | ICD-10-CM | POA: Diagnosis not present

## 2020-03-21 DIAGNOSIS — Z79899 Other long term (current) drug therapy: Secondary | ICD-10-CM | POA: Diagnosis not present

## 2020-03-21 DIAGNOSIS — R0789 Other chest pain: Secondary | ICD-10-CM | POA: Diagnosis not present

## 2020-03-21 DIAGNOSIS — I4510 Unspecified right bundle-branch block: Secondary | ICD-10-CM | POA: Diagnosis not present

## 2020-03-21 DIAGNOSIS — Z87891 Personal history of nicotine dependence: Secondary | ICD-10-CM | POA: Diagnosis not present

## 2020-03-21 DIAGNOSIS — Z743 Need for continuous supervision: Secondary | ICD-10-CM | POA: Diagnosis not present

## 2020-03-21 DIAGNOSIS — I10 Essential (primary) hypertension: Secondary | ICD-10-CM | POA: Diagnosis not present

## 2020-03-21 DIAGNOSIS — Z7901 Long term (current) use of anticoagulants: Secondary | ICD-10-CM | POA: Diagnosis not present

## 2020-03-21 DIAGNOSIS — I4891 Unspecified atrial fibrillation: Secondary | ICD-10-CM | POA: Insufficient documentation

## 2020-03-21 DIAGNOSIS — R109 Unspecified abdominal pain: Secondary | ICD-10-CM | POA: Diagnosis present

## 2020-03-21 LAB — CBC
HCT: 39.9 % (ref 39.0–52.0)
Hemoglobin: 13.2 g/dL (ref 13.0–17.0)
MCH: 30.1 pg (ref 26.0–34.0)
MCHC: 33.1 g/dL (ref 30.0–36.0)
MCV: 91.1 fL (ref 80.0–100.0)
Platelets: 189 10*3/uL (ref 150–400)
RBC: 4.38 MIL/uL (ref 4.22–5.81)
RDW: 15.7 % — ABNORMAL HIGH (ref 11.5–15.5)
WBC: 8 10*3/uL (ref 4.0–10.5)
nRBC: 0 % (ref 0.0–0.2)

## 2020-03-21 LAB — COMPREHENSIVE METABOLIC PANEL
ALT: 19 U/L (ref 0–44)
AST: 26 U/L (ref 15–41)
Albumin: 4.1 g/dL (ref 3.5–5.0)
Alkaline Phosphatase: 50 U/L (ref 38–126)
Anion gap: 9 (ref 5–15)
BUN: 14 mg/dL (ref 8–23)
CO2: 25 mmol/L (ref 22–32)
Calcium: 9.2 mg/dL (ref 8.9–10.3)
Chloride: 98 mmol/L (ref 98–111)
Creatinine, Ser: 0.78 mg/dL (ref 0.61–1.24)
GFR calc Af Amer: >60 mL/min (ref >60)
GFR calc non Af Amer: >60 mL/min (ref >60)
Glucose, Bld: 100 mg/dL — ABNORMAL HIGH (ref 70–99)
Potassium: 4.2 mmol/L (ref 3.5–5.1)
Sodium: 132 mmol/L — ABNORMAL LOW (ref 135–145)
Total Bilirubin: 0.4 mg/dL (ref 0.3–1.2)
Total Protein: 7.2 g/dL (ref 6.5–8.1)

## 2020-03-21 LAB — LIPASE, BLOOD: Lipase: 22 U/L (ref 11–51)

## 2020-03-21 LAB — TROPONIN I (HIGH SENSITIVITY)
Troponin I (High Sensitivity): 7 ng/L (ref <18)
Troponin I (High Sensitivity): 11 ng/L (ref <18)

## 2020-03-21 MED ORDER — SODIUM CHLORIDE 0.9% FLUSH
3.0000 mL | Freq: Once | INTRAVENOUS | Status: DC
  Filled 2020-03-21: qty 3

## 2020-03-21 MED ORDER — PANTOPRAZOLE SODIUM 40 MG PO TBEC
40.0000 mg | DELAYED_RELEASE_TABLET | Freq: Once | ORAL | Status: AC
  Administered 2020-03-21: 40 mg via ORAL
  Filled 2020-03-21: qty 1

## 2020-03-21 NOTE — Discharge Instructions (Signed)
Start taking her Protonix as directed for the next 5 days.  Follow-up with your cardiologist on Wednesday.  Return here as needed if you have any worsening symptoms.

## 2020-03-21 NOTE — ED Triage Notes (Signed)
Brought by ems from [facility] for c/o epigastric pain.  Hx of gerd.  Reports some better since transporting here.

## 2020-03-21 NOTE — ED Provider Notes (Signed)
[facility] EMERGENCY DEPARTMENT Provider Note   CSN: [id number] Arrival date & time: 03/21/20  1806     History Chief Complaint  Patient presents with  . Abdominal Pain    [name] is a 86 y.o. male.  Patient is a 86 year old male who presents with chest pain.  He does have a history of coronary artery disease status post bypass surgery in 2011.  He also has a history of paroxysmal atrial fibrillation is on Eliquis.  He started having some discomfort yesterday in his epigastrium.  It started after he ate lunch.  It felt like indigestion.  However it has started to radiate up to his left chest and he had a little bit to his back although he denies any back discomfort now.  He says now its mostly in his left chest.  He says is been intermittent since yesterday.  It is nonexertional.  No associated shortness of breath.  No nausea or vomiting.  No abdominal pain.  He has not taken anything for the pain.  He currently does not report any chest discomfort.        Past Medical History:  Diagnosis Date  . Anxiety   . Atrial fibrillation ([place])   . Blood transfusion without reported diagnosis   . CAD    a. 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  . Cataract   . Chronic abdominal pain   . Chronic nausea   . Coronary artery disease   . Diverticulosis 2011   Diverticulitis 2004  . Dyspepsia   . Esophageal reflux   . GERD (gastroesophageal reflux disease)   . Headache    saw neuro 4-16, MRI done  . HYPERPLASIA, PROSTATE NOS W/URINARY OBST/LUTS   . IBS   . INGUINAL HERNIA   . Lichen planus [id number]   [name]  . LUMBAR RADICULOPATHY, RIGHT   . Mixed hyperlipidemia   . OSTEOPOROSIS   . PONV (postoperative nausea and vomiting)   . PREMATURE VENTRICULAR CONTRACTIONS    a. After CABG - was on Coumadin/Multaq for a period of time. Coumadin discontinued 04/2010 after maintaining NSR.  [name]  UNSPECIFIED ANEMIA     Patient Active Problem List   Diagnosis Date Noted  . Abdominal pain, epigastric   . Anxiety and depression 06/22/2016  . COPD (chronic obstructive pulmonary disease) ([place]) 03/25/2016  . Annual physical exam 03/24/2016  . PCP NOTES >>>>>>>>>>>>>> 08/27/2015  . Allergic rhinitis 03/09/2015  . Headache 02/23/2015  . Bradycardia 06/15/2014  . Arthritis, degenerative 06/15/2014  . Hyperglycemia 01/08/2014  . Paroxysmal A-fib ([place]) 12/28/2013  . Diverticulosis of colon without hemorrhage 10/13/2013  . Mixed hyperlipidemia 04/18/2010  . CAD (coronary artery disease) 02/25/2010  . HIP FRACTURE, RIGHT 12/13/2009  . IBS 09/01/2009  . LUMBAR RADICULOPATHY, RIGHT 06/09/2009  . Esophageal reflux 09/22/2008  . PALPITATIONS 05/11/2008  . HYPERPLASIA, PROSTATE NOS W/URINARY OBST/LUTS 09/20/2007  . Closed fracture of thoracic vertebra ([place]) 09/20/2007  . Osteoporosis 01/01/2007    Past Surgical History:  Procedure Laterality Date  . BIOPSY  09/10/2018   Procedure: BIOPSY;  Surgeon: Jerene Bears, MD;  Location: [facility];  Service: Gastroenterology;;  . CATARACT EXTRACTION Right 10/06/2013  . CORONARY ARTERY BYPASS GRAFT     2 VD;anomalous RCA;post op compliacted by AF  . ESOPHAGOGASTRODUODENOSCOPY (EGD) WITH PROPOFOL N/A 09/10/2018   Procedure: ESOPHAGOGASTRODUODENOSCOPY (EGD) WITH PROPOFOL;  Surgeon: Jerene Bears,  MD;  Location: [facility];  Service: Gastroenterology;  Laterality: N/A;  . HIP FRACTURE SURGERY Right 03/2007   Dr. Salvadore Farber  . INGUINAL HERNIA REPAIR Left 2012   Dr Brantley Stage  . KNEE SURGERY Right 1977   repair  . PROSTATE BIOPSY  09/19/2013   [facility]       Family History  Problem Relation Age of Onset  . Stroke Mother   . Pancreatic cancer Father        Deceased, 57  . Breast cancer Sister        Deceased  . Heart disease Sister 87  . Stroke Maternal Aunt   . Colon cancer Neg Hx   . Prostate cancer Neg Hx   . Esophageal  cancer Neg Hx   . Rectal cancer Neg Hx   . Stomach cancer Neg Hx     Social History   Tobacco Use  . Smoking status: Former Smoker    Types: Cigarettes    Quit date: 11/27/1962    Years since quitting: 57.3  . Smokeless tobacco: Never Used  Substance Use Topics  . Alcohol use: Not Currently    Alcohol/week: 0.0 standard drinks    Comment: quit drinking   . Drug use: No    Home Medications Prior to Admission medications   Medication Sig Start Date End Date Taking? Authorizing Provider  acetaminophen (TYLENOL) 500 MG tablet Take 500 mg by mouth every 6 (six) hours as needed for mild pain.    [provider]  aspirin EC 81 MG tablet Take 81 mg by mouth daily.    [provider]  Calcium Carbonate Antacid (TUMS PO) Take 1 tablet by mouth as needed (heartburn). For calcium     [provider]  cholecalciferol (VITAMIN D) 1000 UNITS tablet Take 1,000 Units by mouth every morning.      [provider]  ELIQUIS 5 MG TABS tablet TAKE ONE (1) TABLET BY MOUTH TWO (2) TIMES DAILY 03/08/20   Josue Hector, MD  folic acid (FOLVITE) 1 MG tablet TAKE ONE (1) TABLET BY MOUTH EACH DAY 11/04/19   Josue Hector, MD  loratadine (CLARITIN) 10 MG tablet Take 0.5-1 tablets (5-10 mg total) by mouth daily as needed for allergies. 01/30/19   Colon Branch, MD  Multiple Vitamin (MULITIVITAMIN WITH MINERALS) TABS Take 1 tablet by mouth daily.      [provider]  nitroGLYCERIN (NITROSTAT) 0.4 MG SL tablet Place 1 tablet (0.4 mg total) under the tongue every 5 (five) minutes x 3 doses as needed for chest pain. Patient not taking: Reported on 12/17/2019 08/21/17   Colon Branch, MD  pantoprazole (PROTONIX) 40 MG tablet Take 1 tablet (40 mg total) by mouth daily before breakfast. 12/19/19   Colon Branch, MD  rosuvastatin (CRESTOR) 10 MG tablet TAKE 1 TABLET BY MOUTH DAILY 01/06/20   Josue Hector, MD    Allergies    Amoxicillin, Zoloft [sertraline hcl], and Remeron  [mirtazapine]  Review of Systems   Review of Systems  Constitutional: Negative for chills, diaphoresis, fatigue and fever.  HENT: Negative for congestion, rhinorrhea and sneezing.   Eyes: Negative.   Respiratory: Negative for cough, chest tightness and shortness of breath.   Cardiovascular: Positive for chest pain. Negative for leg swelling.  Gastrointestinal: Negative for abdominal pain, blood in stool, diarrhea, nausea and vomiting.  Genitourinary: Negative for difficulty urinating, flank pain, frequency and hematuria.  Musculoskeletal: Negative for arthralgias and back pain.  Skin: Negative for rash.  Neurological: Negative for dizziness, speech difficulty, weakness, numbness and headaches.    Physical Exam Updated Vital Signs BP (!) 157/70   Pulse 69   Temp 97.8 F (36.6 C) (Oral)   Resp 20   Ht 6\' 2"  (1.88 m)   Wt 71.2 kg   SpO2 100%   BMI 20.16 kg/m   Physical Exam Constitutional:      Appearance: He is well-developed.  HENT:     Head: Normocephalic and atraumatic.  Eyes:     Pupils: Pupils are equal, round, and reactive to light.  Cardiovascular:     Rate and Rhythm: Normal rate and regular rhythm.     Heart sounds: Normal heart sounds.  Pulmonary:     Effort: Pulmonary effort is normal. No respiratory distress.     Breath sounds: Normal breath sounds. No wheezing or rales.  Chest:     Chest wall: No tenderness.  Abdominal:     General: Bowel sounds are normal.     Palpations: Abdomen is soft.     Tenderness: There is no abdominal tenderness. There is no guarding or rebound.  Musculoskeletal:        General: Normal range of motion.     Cervical back: Normal range of motion and neck supple.  Lymphadenopathy:     Cervical: No cervical adenopathy.  Skin:    General: Skin is warm and dry.     Findings: No rash.  Neurological:     Mental Status: He is alert and oriented to person, place, and time.     ED Results / Procedures / Treatments   Labs (all  labs ordered are listed, but only abnormal results are displayed) Labs Reviewed  COMPREHENSIVE METABOLIC PANEL - Abnormal; Notable for the following components:      Result Value   Sodium 132 (*)    Glucose, Bld 100 (*)    All other components within normal limits  CBC - Abnormal; Notable for the following components:   RDW 15.7 (*)    All other components within normal limits  LIPASE, BLOOD  TROPONIN I (HIGH SENSITIVITY)  TROPONIN I (HIGH SENSITIVITY)    EKG EKG Interpretation  Date/Time:  Sunday March 21 2020 19:36:15 EDT Ventricular Rate:  67 PR Interval:    QRS Duration: 148 QT Interval:  432 QTC Calculation: 457 R Axis:   47 Text Interpretation: Sinus rhythm Borderline prolonged PR interval Right bundle branch block similar to EKG from same day Confirmed by Malvin Johns [phone]) on 03/21/2020 8:22:22 PM   Radiology No results found.  Procedures Procedures (including critical care time)  Medications Ordered in ED Medications  sodium chloride flush (NS) 0.9 % injection 3 mL (3 mLs Intravenous Not Given 03/21/20 1817)  pantoprazole (PROTONIX) EC tablet 40 mg (has no administration in time range)    ED Course  I have reviewed the triage vital signs and the nursing notes.  Pertinent labs & imaging results that were available during my care of the patient were reviewed by me and considered in my medical decision making (see chart for details).    MDM Rules/Calculators/A&P                      Patient is a 86 year old male who presents with chest pain.  It started with indigestion after eating and goes to his left chest.  He denies any current discomfort.  He has no exertional symptoms.  He has had 2 -  troponins.  I spoke with cardiology, Dr. Kalman Shan who is on-call tonight.  I was concerned about a little change in aVR and some worsening right bundle branch block.  The cardiologist does not feel that aside from the right bundle branch block there is any other significant  changes.  He feels given that the patient had mostly symptoms after eating and no exertional symptoms or other associated symptoms, he can be discharged.  Patient has a follow-up appointment with his cardiologist in 3 days.  He was given strict return precautions.  He only takes his Protonix occasionally.  I advised him to take his Protonix regularly for the next few days. Final Clinical Impression(s) / ED Diagnoses Final diagnoses:  Atypical chest pain    Rx / DC Orders ED Discharge Orders    None       Malvin Johns, MD 03/21/20 2241

## 2020-03-22 ENCOUNTER — Telehealth: Payer: Self-pay

## 2020-03-22 NOTE — Telephone Encounter (Signed)
Called pt and left detailed voicemail informing him that it is time to schedule Prolia injection and this office would like a call back in order to schedule him.

## 2020-03-24 ENCOUNTER — Ambulatory Visit (INDEPENDENT_AMBULATORY_CARE_PROVIDER_SITE_OTHER): Payer: Medicare Other | Admitting: Internal Medicine

## 2020-03-24 ENCOUNTER — Telehealth (HOSPITAL_COMMUNITY): Payer: Self-pay | Admitting: *Deleted

## 2020-03-24 ENCOUNTER — Telehealth: Payer: Self-pay | Admitting: Internal Medicine

## 2020-03-24 ENCOUNTER — Encounter: Payer: Self-pay | Admitting: Cardiovascular Disease

## 2020-03-24 ENCOUNTER — Ambulatory Visit: Payer: Medicare Other | Admitting: Cardiovascular Disease

## 2020-03-24 ENCOUNTER — Encounter: Payer: Self-pay | Admitting: Internal Medicine

## 2020-03-24 ENCOUNTER — Other Ambulatory Visit: Payer: Self-pay

## 2020-03-24 VITALS — BP 128/64 | HR 57 | Ht 74.0 in | Wt 158.0 lb

## 2020-03-24 VITALS — BP 123/60 | HR 74 | Temp 97.1°F | Resp 18 | Ht 74.0 in | Wt 155.1 lb

## 2020-03-24 DIAGNOSIS — K219 Gastro-esophageal reflux disease without esophagitis: Secondary | ICD-10-CM | POA: Diagnosis not present

## 2020-03-24 DIAGNOSIS — M79604 Pain in right leg: Secondary | ICD-10-CM

## 2020-03-24 DIAGNOSIS — R0789 Other chest pain: Secondary | ICD-10-CM

## 2020-03-24 DIAGNOSIS — R079 Chest pain, unspecified: Secondary | ICD-10-CM

## 2020-03-24 MED ORDER — NITROGLYCERIN 0.4 MG SL SUBL: 0.4000 mg | SUBLINGUAL_TABLET | SUBLINGUAL | 3 refills | Status: DC | PRN

## 2020-03-24 NOTE — Patient Instructions (Signed)
Medication Instructions:  Your physician has recommended you make the following change in your medication:  1-Take 1 Nitroglycerin (NTG), under your tongue, while sitting. If no relief of pain may repeat NTG, one tab every 5 minutes up to 3 tablets total over 15 minutes. If no relief CALL 911. If you have dizziness/lightheadness while taking NTG, stop taking and call 911.  *If you need a refill on your cardiac medications before your next appointment, please call your pharmacy*  Lab Work: If you have labs (blood work) drawn today and your tests are completely normal, you will receive your results only by: [name] MyChart Message (if you have MyChart) OR . A paper copy in the mail If you have any lab test that is abnormal or we need to change your treatment, we will call you to review the results.  Testing/Procedures: Your physician has requested that you have a lexiscan myoview. For further information please visit [email or web address]. Please follow instruction sheet, as given.  Follow-Up: At [facility], you and your health needs are our priority.  As part of our continuing mission to provide you with exceptional heart care, we have created designated Provider Care Teams.  These Care Teams include your primary Cardiologist (physician) and Advanced Practice Providers (APPs -  Physician Assistants and Nurse Practitioners) who all work together to provide you with the care you need, when you need it.  We recommend signing up for the patient portal called "MyChart".  Sign up information is provided on this After Visit Summary.  MyChart is used to connect with patients for Virtual Visits (Telemedicine).  Patients are able to view lab/test results, encounter notes, upcoming appointments, etc.  Non-urgent messages can be sent to your provider as well.   To learn more about what you can do with MyChart, go to [email or web address].    Your next appointment:   1 year(s)  The format for your next  appointment:   In Person  Provider:   You may see Dr. Johnsie Cancel or one of the following Advanced Practice Providers on your designated Care Team:    Truitt Merle, NP  Cecilie Kicks, NP  Kathyrn Drown, NP

## 2020-03-24 NOTE — Progress Notes (Signed)
  Chronic Care Management   Note  03/24/2020 Name: [name] MRN: [id number] DOB: [birth year]  [name] is a 86 y.o. year old male who is a primary care patient of Colon Branch, MD. I reached out to [name] by phone today in response to a referral sent by [name] PCP, Colon Branch, MD.   Mr. Natale was given information about Chronic Care Management services today including:  1. CCM service includes personalized support from designated clinical staff supervised by his physician, including individualized plan of care and coordination with other care providers 2. 24/7 contact phone numbers for assistance for urgent and routine care needs. 3. Service will only be billed when office clinical staff spend 20 minutes or more in a month to coordinate care. 4. Only one practitioner may furnish and bill the service in a calendar month. 5. The patient may stop CCM services at any time (effective at the end of the month) by phone call to the office staff.   Patient agreed to services and verbal consent obtained.    This note is not being shared with the patient for the following reason: To respect privacy (The patient or proxy has requested that the information not be shared).  Follow up plan:  [name] [employer] Scheduler

## 2020-03-24 NOTE — Telephone Encounter (Signed)
Left message on voicemail in reference to upcoming appointment scheduled for 03/31/2020. Phone number given for a call back so details instructions can be given. Olivea Sonnen, Ranae Palms  No mychart available

## 2020-03-24 NOTE — Progress Notes (Signed)
Subjective:    Patient ID: [name], male    DOB: 1930/09/13, 86 y.o.   MRN: [id number]  DOS:  03/24/2020 Type of visit - description: Acute, right leg pain.  The pain is started 03/21/2020, he was stepping down from a stretcher (was at the ER with chest pain at the time, eventually discharged home) and immediately felt pain at the right leg and noticed a knot proximal to the right knee. Since then, pain is slightly decreased. No bruising noted.  Also, he went to the ER with chest pain, seen by cardiology today (note reviewed). He reportes he suspect CP is probably related to the stomach: Reports postprandial belching and relief of chest pain after belching.  Denies any nausea, vomiting, diarrhea.  No blood in the stools. No heartburn percent No dysphagia or odynophagia     Review of Systems Denies back pain or lower extremity paresthesias. No claudication per se  Past Medical History:  Diagnosis Date  . Anxiety   . Atrial fibrillation ([place])   . Blood transfusion without reported diagnosis   . CAD    a. 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  . Cataract   . Chronic abdominal pain   . Chronic nausea   . Coronary artery disease   . Diverticulosis 2011   Diverticulitis 2004  . Dyspepsia   . Esophageal reflux   . GERD (gastroesophageal reflux disease)   . Headache    saw neuro 4-16, MRI done  . HYPERPLASIA, PROSTATE NOS W/URINARY OBST/LUTS   . IBS   . INGUINAL HERNIA   . Lichen planus [id number]   [name]  . LUMBAR RADICULOPATHY, RIGHT   . Mixed hyperlipidemia   . OSTEOPOROSIS   . PONV (postoperative nausea and vomiting)   . PREMATURE VENTRICULAR CONTRACTIONS    a. After CABG - was on Coumadin/Multaq for a period of time. Coumadin discontinued 04/2010 after maintaining NSR.  [name] UNSPECIFIED ANEMIA     Past Surgical History:  Procedure Laterality Date  . BIOPSY  09/10/2018   Procedure:  BIOPSY;  Surgeon: Jerene Bears, MD;  Location: [facility];  Service: Gastroenterology;;  . CATARACT EXTRACTION Right 10/06/2013  . CORONARY ARTERY BYPASS GRAFT     2 VD;anomalous RCA;post op compliacted by AF  . ESOPHAGOGASTRODUODENOSCOPY (EGD) WITH PROPOFOL N/A 09/10/2018   Procedure: ESOPHAGOGASTRODUODENOSCOPY (EGD) WITH PROPOFOL;  Surgeon: Jerene Bears, MD;  Location: [facility];  Service: Gastroenterology;  Laterality: N/A;  . HIP FRACTURE SURGERY Right 03/2007   Dr. Salvadore Farber  . INGUINAL HERNIA REPAIR Left 2012   Dr Brantley Stage  . KNEE SURGERY Right 1977   repair  . PROSTATE BIOPSY  09/19/2013   [facility]    Allergies as of 03/24/2020      Reactions   Amoxicillin Other (See Comments)   REACTION: n/v/d   Zoloft [sertraline Hcl]    Patient didn't like the way it made him feel   Remeron [mirtazapine] Other (See Comments)   Excessive Drowsiness      Medication List       Accurate as of March 24, 2020  1:57 PM. If you have any questions, ask your nurse or doctor.        acetaminophen 500 MG tablet Commonly known as: TYLENOL Take 500 mg by mouth every 6 (six) hours as needed for mild pain.   aspirin EC 81 MG tablet Take 81  mg by mouth daily.   cholecalciferol 1000 units tablet Commonly known as: VITAMIN D Take 1,000 Units by mouth every morning.   Eliquis 5 MG Tabs tablet Generic drug: apixaban TAKE ONE (1) TABLET BY MOUTH TWO (2) TIMES DAILY   folic acid 1 MG tablet Commonly known as: FOLVITE TAKE ONE (1) TABLET BY MOUTH EACH DAY   loratadine 10 MG tablet Commonly known as: CLARITIN Take 0.5-1 tablets (5-10 mg total) by mouth daily as needed for allergies.   multivitamin with minerals Tabs tablet Take 1 tablet by mouth daily.   nitroGLYCERIN 0.4 MG SL tablet Commonly known as: NITROSTAT Place 1 tablet (0.4 mg total) under the tongue every 5 (five) minutes x 3 doses as needed for chest pain.   pantoprazole 40 MG tablet Commonly known as:  PROTONIX Take 1 tablet (40 mg total) by mouth daily before breakfast.   rosuvastatin 10 MG tablet Commonly known as: CRESTOR TAKE 1 TABLET BY MOUTH DAILY   TUMS PO Take 1 tablet by mouth as needed (heartburn). For calcium          Objective:   Physical Exam BP 123/60 (BP Location: Left Arm, Patient Position: Sitting, Cuff Size: Small)   Pulse 74   Temp (!) 97.1 F (36.2 C) (Temporal)   Resp 18   Ht 6\' 2"  (1.88 m)   Wt 155 lb 2 oz (70.4 kg)   SpO2 99%   BMI 19.92 kg/m  General:   Well developed, NAD, BMI noted.  HEENT:  Normocephalic . Face symmetric, atraumatic  Abdomen:  Not distended, soft, non-tender. No rebound or rigidity.  Palpable aorta (not a new issue) Skin: Not pale. Not jaundice Lower extremities: Left leg normal Right leg: No deformities, no ecchymosis or swelling except for perhaps mild puffiness just proximal from the knee. Right knee with no effusion or redness. Both legs with normal range of motion. Neurologic:  alert & oriented X3.  Speech normal, gait appropriate for age and unassisted Psych--  Cognition and judgment appear intact.  Cooperative with normal attention span and concentration.  Behavior appropriate. No anxious or depressed appearing.     Assessment      Assessment Prediabetes, A1c 6.0 (2015) Hyperlipidemia Anxiety, depression:  -intol to zoloft, Rx lexapro 5 mg 07-2015: intolerant d/t nausea  -lost wife July 2017 CV: ---CAD--CABG 2011 ----P- Atrial fibrillation, PVCs.  Elliquis ; Multaq d/c d/t GI s/e ----Palpable Ao x years, [place] (-) for AAA 2005 COPD (per CXR 06-2015), PFTs 10-04-15 mild obstruction DJD  Gait-- uses a walker prn GI: --GERD, IBS, diverticulosis --Chronic nausea: 2019.  Work-up: 08-2018: Negative EGD, CT  and US abdomen. 12/2018 wnl Gastric empty stomach.  Symptoms decreased after Multaq stopped GU: elevated PSA, BPH , (-) bx 2014 Osteoporosis: -hip FX 2008 - dexa ~2005 --> rx fosamax, took x 1 year, dexa  ~2010 was rec no fosamax at that time. Dexa 09-2015: T score -2.4 --> rx fosamax, d/c d/t jaw pain 06-2017; took prolia #25 February 2018; T score (-) 3 February [id number], per Endo Lichen planus Headache -- saw neurology 02-2015, had a MRI/MRA (-)  PLAN Right leg pain: As described above, I wonder about a strain or perhaps a partial rupture of the rectus femoris.  At this point exam is benign, he is getting better, we agreed on ice the area, Tylenol and call if not gradually better. GERD: Long discussion about the issue, he c/o belching.  Good compliance with Protonix twice daily, this morning cardiology  added Pepcid, pt  encouraged to take it. On chart review he had chronic nausea which is not an issue at this time.  He had a negative EGD October 2019.  Also a negative ultrasound & normal gastric emptying study. I recommend to monitor symptoms and reassess when he comes back CAD: Went to the ER with CP on 03/21/2020, had a follow-up with cardiology today, they Rx Myoview.    RTC scheduled for 05-2020    This visit occurred during the SARS-CoV-2 public health emergency.  Safety protocols were in place, including screening questions prior to the visit, additional usage of staff PPE, and extensive cleaning of exam room while observing appropriate contact time as indicated for disinfecting solutions.

## 2020-03-24 NOTE — Assessment & Plan Note (Signed)
Right leg pain: As described above, I wonder about a strain or perhaps a partial rupture of the rectus femoris.  At this point exam is benign, he is getting better, we agreed on ice the area, Tylenol and call if not gradually better. GERD: Long discussion about the issue, he c/o belching.  Good compliance with Protonix twice daily, this morning cardiology added Pepcid, pt  encouraged to take it. On chart review he had chronic nausea which is not an issue at this time.  He had a negative EGD October 2019.  Also a negative ultrasound & normal gastric emptying study. I recommend to monitor symptoms and reassess when he comes back CAD: Went to the ER with CP on 03/21/2020, had a follow-up with cardiology today, they Rx Myoview.    RTC scheduled for 05-2020

## 2020-03-24 NOTE — Progress Notes (Signed)
  Chronic Care Management   Outreach Note  03/24/2020 Name: [name] MRN: [id number] DOB: 25-Nov-1930  Referred by: Colon Branch, MD Reason for referral : No chief complaint on file.   An unsuccessful telephone outreach was attempted today. The patient was referred to the pharmacist for assistance with care management and care coordination.    This note is not being shared with the patient for the following reason: To respect privacy (The patient or proxy has requested that the information not be shared).  Follow Up Plan:   [name] [employer] Scheduler

## 2020-03-24 NOTE — Patient Instructions (Signed)
For leg pain: Ice the area daily for few days Use Tylenol as needed Call if not gradually better  For the belching: Call if your stomach problem increases.  See you in July, you already have an appointment with me

## 2020-03-24 NOTE — Progress Notes (Signed)
Pre visit review using our clinic review tool, if applicable. No additional management support is needed unless otherwise documented below in the visit note. 

## 2020-03-25 ENCOUNTER — Telehealth (HOSPITAL_COMMUNITY): Payer: Self-pay | Admitting: Radiology

## 2020-03-25 NOTE — Telephone Encounter (Signed)
Patient given detailed instructions per Myocardial Perfusion Study Information Sheet for the test on 03/31/2020 at 10:30. Patient notified to arrive 15 minutes early and that it is imperative to arrive on time for appointment to keep from having the test rescheduled.  If you need to cancel or reschedule your appointment, please call the office within 24 hours of your appointment. . Patient verbalized understanding.EHK

## 2020-03-29 ENCOUNTER — Telehealth: Payer: Self-pay | Admitting: Internal Medicine

## 2020-03-29 NOTE — Telephone Encounter (Signed)
Patient called to request that a new RX for Physical therapy for Osteoporosis and balance be faxed to [facility] fax# [phone] Rehab requires Rx be written within 30 days or else it expires which last Rx for patient has expired)

## 2020-03-29 NOTE — Telephone Encounter (Signed)
Dr. Cruzita Lederer please advise if you do this or PCP?

## 2020-03-30 ENCOUNTER — Other Ambulatory Visit: Payer: Self-pay

## 2020-03-30 ENCOUNTER — Ambulatory Visit: Payer: Medicare Other

## 2020-03-30 DIAGNOSIS — M810 Age-related osteoporosis without current pathological fracture: Secondary | ICD-10-CM | POA: Diagnosis not present

## 2020-03-30 MED ORDER — DENOSUMAB 60 MG/ML ~~LOC~~ SOSY
60.0000 mg | PREFILLED_SYRINGE | Freq: Once | SUBCUTANEOUS | Status: AC
  Administered 2020-03-30: 60 mg via SUBCUTANEOUS

## 2020-03-30 NOTE — Progress Notes (Signed)
Per orders of Dr. Gherghe injection of Prolia given today by Yalitza Teed W . Patient tolerated injection well. 

## 2020-03-30 NOTE — Telephone Encounter (Signed)
Faxed the new Rx to [facility] as requested using the number provided

## 2020-03-30 NOTE — Telephone Encounter (Signed)
Done

## 2020-03-31 ENCOUNTER — Encounter (HOSPITAL_COMMUNITY): Payer: Medicare Other

## 2020-04-06 ENCOUNTER — Telehealth (HOSPITAL_COMMUNITY): Payer: Self-pay | Admitting: Cardiovascular Disease

## 2020-04-06 DIAGNOSIS — M25561 Pain in right knee: Secondary | ICD-10-CM | POA: Diagnosis not present

## 2020-04-06 DIAGNOSIS — M810 Age-related osteoporosis without current pathological fracture: Secondary | ICD-10-CM | POA: Diagnosis not present

## 2020-04-06 DIAGNOSIS — R2681 Unsteadiness on feet: Secondary | ICD-10-CM | POA: Diagnosis not present

## 2020-04-06 NOTE — Telephone Encounter (Signed)
Patient cancelled Myoview due to leg pain on 03/31/20.  I called patient to reschedule and he states he is still having severe leg pain and would like for me to call him back in about 1 month and he would schedule if he was feeling well. I deferred order for 30 days.

## 2020-04-12 DIAGNOSIS — R2681 Unsteadiness on feet: Secondary | ICD-10-CM | POA: Diagnosis not present

## 2020-04-12 DIAGNOSIS — M810 Age-related osteoporosis without current pathological fracture: Secondary | ICD-10-CM | POA: Diagnosis not present

## 2020-04-12 DIAGNOSIS — M25561 Pain in right knee: Secondary | ICD-10-CM | POA: Diagnosis not present

## 2020-04-15 DIAGNOSIS — M810 Age-related osteoporosis without current pathological fracture: Secondary | ICD-10-CM | POA: Diagnosis not present

## 2020-04-15 DIAGNOSIS — R2681 Unsteadiness on feet: Secondary | ICD-10-CM | POA: Diagnosis not present

## 2020-04-15 DIAGNOSIS — M25561 Pain in right knee: Secondary | ICD-10-CM | POA: Diagnosis not present

## 2020-04-19 DIAGNOSIS — M810 Age-related osteoporosis without current pathological fracture: Secondary | ICD-10-CM | POA: Diagnosis not present

## 2020-04-19 DIAGNOSIS — R2681 Unsteadiness on feet: Secondary | ICD-10-CM | POA: Diagnosis not present

## 2020-04-19 DIAGNOSIS — M25561 Pain in right knee: Secondary | ICD-10-CM | POA: Diagnosis not present

## 2020-04-22 DIAGNOSIS — M810 Age-related osteoporosis without current pathological fracture: Secondary | ICD-10-CM | POA: Diagnosis not present

## 2020-04-22 DIAGNOSIS — R2681 Unsteadiness on feet: Secondary | ICD-10-CM | POA: Diagnosis not present

## 2020-04-22 DIAGNOSIS — M25561 Pain in right knee: Secondary | ICD-10-CM | POA: Diagnosis not present

## 2020-04-26 DIAGNOSIS — M810 Age-related osteoporosis without current pathological fracture: Secondary | ICD-10-CM | POA: Diagnosis not present

## 2020-04-26 DIAGNOSIS — M25561 Pain in right knee: Secondary | ICD-10-CM | POA: Diagnosis not present

## 2020-04-26 DIAGNOSIS — R2681 Unsteadiness on feet: Secondary | ICD-10-CM | POA: Diagnosis not present

## 2020-04-29 DIAGNOSIS — M810 Age-related osteoporosis without current pathological fracture: Secondary | ICD-10-CM | POA: Diagnosis not present

## 2020-04-29 DIAGNOSIS — M25561 Pain in right knee: Secondary | ICD-10-CM | POA: Diagnosis not present

## 2020-04-29 DIAGNOSIS — R2681 Unsteadiness on feet: Secondary | ICD-10-CM | POA: Diagnosis not present

## 2020-05-03 ENCOUNTER — Other Ambulatory Visit: Payer: Self-pay

## 2020-05-03 DIAGNOSIS — R739 Hyperglycemia, unspecified: Secondary | ICD-10-CM

## 2020-05-03 DIAGNOSIS — I25810 Atherosclerosis of coronary artery bypass graft(s) without angina pectoris: Secondary | ICD-10-CM

## 2020-05-03 DIAGNOSIS — E782 Mixed hyperlipidemia: Secondary | ICD-10-CM

## 2020-05-04 ENCOUNTER — Other Ambulatory Visit: Payer: Self-pay

## 2020-05-04 ENCOUNTER — Ambulatory Visit: Payer: Medicare Other | Admitting: Pharmacist

## 2020-05-04 DIAGNOSIS — E782 Mixed hyperlipidemia: Secondary | ICD-10-CM

## 2020-05-04 DIAGNOSIS — R739 Hyperglycemia, unspecified: Secondary | ICD-10-CM

## 2020-05-04 NOTE — Chronic Care Management (AMB) (Signed)
Chronic Care Management Pharmacy  Name: [name]  MRN: [id number] DOB: [birth year]   Chief Complaint/ HPI  [name],  86 y.o. , male presents for their Initial CCM visit with the clinical pharmacist via telephone due to COVID-19 Pandemic.  PCP : Colon Branch, MD  Their chronic conditions include: Afib, Pre-Diabetes, Hyperlipidemia/CAD, GERD, Osteoporosis, Allergic Rhinitis, Arthritis   Office Visits: 03/24/20: Visit w/ Dr. Larose Kells - Acute R leg pain. Benign exam. Recommended ice, tylenol, and call if not better. Prior ER visit d/t chest pain. CP probably related to stomach d/t relief with postprandial belching.   Consult Visit: 03/24/20: Cardio visit w/ Dr. Johnsie Cancel - No med changes noted.   ED Visit 03/21/20: [facility] - Atypical chest pain. Two negative troponins. Symptoms after eating. No med changes noted. F/U with cardio in 3 days. Take protonix regularly.   Medications: Outpatient Encounter Medications as of 05/04/2020  Medication Sig Note  . ACETAMINOPHEN PO Take 650 mg by mouth daily. As needed 01/20/2019: PRN  . aspirin EC 81 MG tablet Take 81 mg by mouth daily.   . Calcium Carbonate Antacid (TUMS PO) Take 1 tablet by mouth as needed (heartburn). For calcium    . cholecalciferol (VITAMIN D) 1000 UNITS tablet Take 1,000 Units by mouth every morning.     [name] ELIQUIS 5 MG TABS tablet TAKE ONE (1) TABLET BY MOUTH TWO (2) TIMES DAILY   . folic acid (FOLVITE) 1 MG tablet TAKE ONE (1) TABLET BY MOUTH EACH [name]   . loratadine (CLARITIN) 10 MG tablet Take 0.5-1 tablets (5-10 mg total) by mouth daily as needed for allergies.   . Multiple Vitamin (MULITIVITAMIN WITH MINERALS) TABS Take 1 tablet by mouth daily.     . nitroGLYCERIN (NITROSTAT) 0.4 MG SL tablet Place 1 tablet (0.4 mg total) under the tongue every 5 (five) minutes x 3 doses as needed for chest pain. 03/24/2020: PRN  . pantoprazole (PROTONIX) 40 MG tablet Take 1 tablet (40 mg total) by mouth daily before breakfast.   .  rosuvastatin (CRESTOR) 10 MG tablet TAKE 1 TABLET BY MOUTH DAILY    No facility-administered encounter medications on file as of 05/04/2020.    Current Diagnosis/Assessment:  Goals Addressed            This Visit's Progress   . Chronic Care Management Pharmacy Care Plan       CARE PLAN ENTRY  Current Barriers:  . Chronic Disease Management support, education, and care coordination needs related to Afib, Pre-Diabetes, Hyperlipidemia/CAD, GERD, Osteoporosis, Allergic Rhinitis, Arthritis     Hyperlipidemia . Pharmacist Clinical Goal(s): o Over the next 180 days, patient will work with PharmD and providers to maintain LDL goal < 70  . Current regimen:  o Rosuvastatin 10mg  daily . Patient self care activities - Over the next 180 days, patient will: o Maintain cholesterol medication regimen.   Pre-Diabetes . Pharmacist Clinical Goal(s): o Over the next 180 days, patient will work with PharmD and providers to maintain A1c goal <6.5% . Current regimen:  o Diet and exercise management   . Interventions: o Discussed diet extensively . Patient self care activities - Over the next 180 days, patient will: o Maintain a1c <6.5%  Health Maintenance  . Pharmacist Clinical Goal(s) o Over the next 180 days, patient will work with PharmD and providers to complete recommended vaccine series . Interventions: o Recommended patient to receive Shingrix vaccine series . Patient self care activities - Over the  next 180 days, patient will: o Complete Shingrix vaccine series  Medication management . Pharmacist Clinical Goal(s): o Over the next 180 days, patient will work with PharmD and providers to maintain optimal medication adherence . Current pharmacy: [facility] . Interventions o Comprehensive medication review performed. o Continue current medication management strategy . Patient self care activities - Over the next 180 days, patient will: o Focus on medication adherence by filling  and taking medications appropriately  o Take medications as prescribed o Report any questions or concerns to PharmD and/or provider(s)  Initial goal documentation       Social Hx:  Lives at [facility]. It is a continuing care facility. Has been there for 9 years. He originally came with his wife who had Alzheimer's. Wife passed in July 2017. No children. Minimal family.  [facility] delivers the medications to the clinic. The clinic gives him a call and then he picks up the medications. He likes to receive his medications in the bottles. He feels like he's old fashioned. He just takes meds from the bottles. He does not use a pill box. He marks what the medications are on the top of the lid.  He reads medical pamphlets that he finds around [facility].  AFIB   Patient is currently neither rate or rhythm controlled. Did not tolerate rate or rhythm medications  Patient has failed these meds in past: multaq (gi), metoprolol (bradycardia) Patient is currently controlled on the following medications: Eliquis 5mg  twice daily  Has not felt nauseous since January 2020. Believes this was due to multaq. He states he was miserable when he was nauseous and is grateful he is feeling better now. States he has not had Afib since he stopped multaq  We discussed:  Indication of multaq and beta blockers in the setting of Afib  Plan -Continue current management and follow up with cardio   Pre-Diabetes   Recent Relevant Labs: Lab Results  Component Value Date/Time   HGBA1C 6.1 01/20/2019 02:49 PM   HGBA1C 6.1 08/20/2018 01:43 PM    A1c goal <6.5% (pt's goal is to have it lower than 6%)  Patient has failed these meds in past: None noted  Patient is currently controlled on the following medications: None  Does not drink dairy. Drinks almond milk. Used to drink soy milk, but switched to almond milk about 5-6 years ago  [place] cooks meals for him. B- alternates  between cereal w/ dried cranberries with almond milk and oatmeal w/ raisens; occasionally will splurge on an egg L - Salads, tenderloin, grilled cheese with pepperjack and tomatoe; biggest meal of the [place] D - Soups; lighter foods Snacks - slivered almonds, cashews, or walnuts (with every meal) Eats oranges, apples, grapes, cantelope, blue berries, strawberries, watermelon. Gave up on bananas d/t potassium.  Gave up sweets. Eats whole wheat bread. Tries to avoid refined flour. Tries to   Exercise Enjoys walking outside to enjoy the scenery. Does not like walking on treadmill.  There are 3 ponds at [facility]. Walking around it 4 times equals a mile.  The ponds have fish and turtles and he will sometime feed the fish and turtles with bread.  We discussed: diet and exercise extensively  Plan -Continue control with diet and exercise   Hyperlipidemia/CAD   Lipid Panel     Component Value Date/Time   CHOL 96 05/27/2019 1035   TRIG 73.0 05/27/2019 1035   HDL 57.20 05/27/2019 1035   [place]  24 05/27/2019 1035    LDL goal <70  The ASCVD Risk score Mikey Bussing DC Jr., et al., 2013) failed to calculate for the following reasons:   The 2013 ASCVD risk score is only valid for ages 11 to 56   Patient has failed these meds in past: None noted  Patient is currently controlled on the following medications: Rosuvastatin 10mg  daily  CABG 2011 States he has been taking rosuvastatin since his operation. Does not salt any food that he eats  Plan -Continue current medications  GERD    Patient has failed these meds in past: None noted  Patient is currently controlled on the following medications: Pantoprazole 40mg  daily before breakfast (does not take daily)  Takes pantoprazole when he eats trigger foods. He states he usually doesn't eat trigger foods Triggers: Spicy  Plan -Continue current medications   Osteoporosis   Last DEXA Scan: 01/22/19   T-Score femoral neck: Left -3.0 (down  8%)  T-Score lumbar spine: -2.2 (up 1.4%)  VITD  Date Value Ref Range Status  12/09/2019 46.37 30.00 - 100.00 ng/mL Final     Patient is a candidate for pharmacologic treatment due to T-Score < -2.5 in femoral neck  Patient has failed these meds in past: Fosamax (jaw pain) Patient is currently controlled on the following medications: Prolia 60mg  every 6 months per Dr. Renne Crigler, calcium carbonate daily  We discussed:  Recommend weight-bearing and muscle strengthening exercises for building and maintaining bone density.  Plan -Continue current medications   Allergic Rhinitis    Patient has failed these meds in past: None noted  Patient is currently controlled on the following medications: loratadine 10mg  daily as needed  Plan -Continue current medications   Arthritis    Patient has failed these meds in past: None noted  Patient is currently controlled on the following medications: Tylenol 650mg  daily as needed  Feels like he has more back pain than hip pain.  Does stretch exercises.  Has done therapy to help him with balance.  He feels like the back exercises help him.  Got a cramp in his thigh about a month ago, but it has resolved.  Has not taken tylenol in a week  Plan -Continue current medications   Vaccines   Reviewed and discussed patient's vaccination history.    Immunization History  Administered Date(s) Administered  . Influenza Whole 09/05/2007, 08/13/2009, 08/27/2012  . Influenza, High Dose Seasonal PF 08/20/2018, 10/02/2019  . Influenza,inj,Quad PF,6+ Mos 08/25/2016  . Influenza-Unspecified 08/26/2013, 08/28/2015, 08/28/2017  . Moderna SARS-COVID-2 Vaccination 12/10/2019, 02/05/2020  . PPD Test 05/16/2011  . Pneumococcal Conjugate-13 05/17/2015  . Pneumococcal Polysaccharide-23 05/18/2011  . Tdap 05/16/2011    Plan -Recommended patient receive Shingrix vaccine in pharmacy.   Pt states his brother did not react well to the shingles vaccine. Pt  states he never had chicken pox so he doesn't feel he needs the shot. States he will discuss further with Dr. Larose Kells and consider getting it.    Miscellaneous Meds  Folic Acid (pt wonders if he still needs to take this)

## 2020-05-06 ENCOUNTER — Telehealth (HOSPITAL_COMMUNITY): Payer: Self-pay | Admitting: Cardiovascular Disease

## 2020-05-06 NOTE — Telephone Encounter (Signed)
Will route to Dr. Johnsie Cancel so he is aware

## 2020-05-06 NOTE — Telephone Encounter (Signed)
I called patient to reschedule Myoview and he does ot wish to have at this time and is feeling better and states it was his reflux and he is not having any issues now.  Order will be removed from the WQ.

## 2020-05-06 NOTE — Patient Instructions (Signed)
Visit Information  Goals Addressed            This Visit's Progress   . Chronic Care Management Pharmacy Care Plan       CARE PLAN ENTRY  Current Barriers:  . Chronic Disease Management support, education, and care coordination needs related to Afib, Pre-Diabetes, Hyperlipidemia/CAD, GERD, Osteoporosis, Allergic Rhinitis, Arthritis     Hyperlipidemia . Pharmacist Clinical Goal(s): o Over the next 180 days, patient will work with PharmD and providers to maintain LDL goal < 70  . Current regimen:  o Rosuvastatin 10mg  daily . Patient self care activities - Over the next 180 days, patient will: o Maintain cholesterol medication regimen.   Pre-Diabetes . Pharmacist Clinical Goal(s): o Over the next 180 days, patient will work with PharmD and providers to maintain A1c goal <6.5% . Current regimen:  o Diet and exercise management   . Interventions: o Discussed diet extensively . Patient self care activities - Over the next 180 days, patient will: o Maintain a1c <6.5%  Health Maintenance  . Pharmacist Clinical Goal(s) o Over the next 180 days, patient will work with PharmD and providers to complete recommended vaccine series . Interventions: o Recommended patient to receive Shingrix vaccine series . Patient self care activities - Over the next 180 days, patient will: o Complete Shingrix vaccine series  Medication management . Pharmacist Clinical Goal(s): o Over the next 180 days, patient will work with PharmD and providers to maintain optimal medication adherence . Current pharmacy: [facility] . Interventions o Comprehensive medication review performed. o Continue current medication management strategy o Collaboration with provider regarding medication management (folic acid indication) . Patient self care activities - Over the next 180 days, patient will: o Focus on medication adherence by filling and taking medications appropriately  o Take medications as  prescribed o Report any questions or concerns to PharmD and/or provider(s)  Initial goal documentation        [name] was given information about Chronic Care Management services today including:  1. CCM service includes personalized support from designated clinical staff supervised by his physician, including individualized plan of care and coordination with other care providers 2. 24/7 contact phone numbers for assistance for urgent and routine care needs. 3. Standard insurance, coinsurance, copays and deductibles apply for chronic care management only during months in which we provide at least 20 minutes of these services. Most insurances cover these services at 100%, however patients may be responsible for any copay, coinsurance and/or deductible if applicable. This service may help you avoid the need for more expensive face-to-face services. 4. Only one practitioner may furnish and bill the service in a calendar month. 5. The patient may stop CCM services at any time (effective at the end of the month) by phone call to the office staff.  Patient agreed to services and verbal consent obtained.   The patient verbalized understanding of instructions provided today and agreed to receive a mailed copy of patient instruction and/or educational materials. Telephone follow up appointment with pharmacy team member scheduled for: 11/03/2020  Melvenia Beam Malaisha Silliman, PharmD Clinical Pharmacist [facility] [phone]   Zoster Vaccine, Recombinant injection What is this medicine? ZOSTER VACCINE (ZOS ter vak SEEN) is used to prevent shingles in adults 84 years old and over. This vaccine is not used to treat shingles or nerve pain from shingles. This medicine may be used for other purposes; ask your health care provider or pharmacist if you have questions.  COMMON BRAND NAME(S): [facility] What should I tell my health care provider before I take this medicine? They need to  know if you have any of these conditions:  blood disorders or disease  cancer like leukemia or lymphoma  immune system problems or therapy  an unusual or allergic reaction to vaccines, other medications, foods, dyes, or preservatives  pregnant or trying to get pregnant  breast-feeding How should I use this medicine? This vaccine is for injection in a muscle. It is given by a health care professional. Talk to your pediatrician regarding the use of this medicine in children. This medicine is not approved for use in children. Overdosage: If you think you have taken too much of this medicine contact a poison control center or emergency room at once. NOTE: This medicine is only for you. Do not share this medicine with others. What if I miss a dose? Keep appointments for follow-up (booster) doses as directed. It is important not to miss your dose. Call your doctor or health care professional if you are unable to keep an appointment. What may interact with this medicine?  medicines that suppress your immune system  medicines to treat cancer  steroid medicines like prednisone or cortisone This list may not describe all possible interactions. Give your health care provider a list of all the medicines, herbs, non-prescription drugs, or dietary supplements you use. Also tell them if you smoke, drink alcohol, or use illegal drugs. Some items may interact with your medicine. What should I watch for while using this medicine? Visit your doctor for regular check ups. This vaccine, like all vaccines, may not fully protect everyone. What side effects may I notice from receiving this medicine? Side effects that you should report to your doctor or health care professional as soon as possible:  allergic reactions like skin rash, itching or hives, swelling of the face, lips, or tongue  breathing problems Side effects that usually do not require medical attention (report these to your doctor or health  care professional if they continue or are bothersome):  chills  headache  fever  nausea, vomiting  redness, warmth, pain, swelling or itching at site where injected  tiredness This list may not describe all possible side effects. Call your doctor for medical advice about side effects. You may report side effects to FDA at 1-800-FDA-1088. Where should I keep my medicine? This vaccine is only given in a clinic, pharmacy, doctor's office, or other health care setting and will not be stored at home. NOTE: This sheet is a summary. It may not cover all possible information. If you have questions about this medicine, talk to your doctor, pharmacist, or health care provider.  2020 Elsevier/Gold Standard (2017-06-25 13:20:30)

## 2020-05-09 ENCOUNTER — Encounter (HOSPITAL_COMMUNITY): Payer: Self-pay | Admitting: Emergency Medicine

## 2020-05-09 ENCOUNTER — Emergency Department (HOSPITAL_COMMUNITY)
Admission: EM | Admit: 2020-05-09 | Discharge: 2020-05-09 | Disposition: A | Discharge status: Home / Self Care | Payer: Medicare Other | Attending: Emergency Medicine | Admitting: Emergency Medicine

## 2020-05-09 ENCOUNTER — Other Ambulatory Visit: Payer: Self-pay

## 2020-05-09 ENCOUNTER — Emergency Department (HOSPITAL_COMMUNITY): Payer: Medicare Other

## 2020-05-09 DIAGNOSIS — R0789 Other chest pain: Secondary | ICD-10-CM | POA: Diagnosis not present

## 2020-05-09 DIAGNOSIS — I4510 Unspecified right bundle-branch block: Secondary | ICD-10-CM | POA: Diagnosis not present

## 2020-05-09 DIAGNOSIS — R42 Dizziness and giddiness: Secondary | ICD-10-CM | POA: Diagnosis not present

## 2020-05-09 DIAGNOSIS — Z951 Presence of aortocoronary bypass graft: Secondary | ICD-10-CM | POA: Diagnosis not present

## 2020-05-09 DIAGNOSIS — Z87891 Personal history of nicotine dependence: Secondary | ICD-10-CM | POA: Insufficient documentation

## 2020-05-09 DIAGNOSIS — I4891 Unspecified atrial fibrillation: Secondary | ICD-10-CM | POA: Insufficient documentation

## 2020-05-09 DIAGNOSIS — Z743 Need for continuous supervision: Secondary | ICD-10-CM | POA: Diagnosis not present

## 2020-05-09 DIAGNOSIS — R079 Chest pain, unspecified: Secondary | ICD-10-CM | POA: Insufficient documentation

## 2020-05-09 DIAGNOSIS — I2510 Atherosclerotic heart disease of native coronary artery without angina pectoris: Secondary | ICD-10-CM | POA: Diagnosis not present

## 2020-05-09 LAB — BASIC METABOLIC PANEL
Anion gap: 7 (ref 5–15)
BUN: 8 mg/dL (ref 8–23)
CO2: 23 mmol/L (ref 22–32)
Calcium: 7.9 mg/dL — ABNORMAL LOW (ref 8.9–10.3)
Chloride: 105 mmol/L (ref 98–111)
Creatinine, Ser: 0.66 mg/dL (ref 0.61–1.24)
GFR calc Af Amer: >60 mL/min (ref >60)
GFR calc non Af Amer: >60 mL/min (ref >60)
Glucose, Bld: 89 mg/dL (ref 70–99)
Potassium: 3.3 mmol/L — ABNORMAL LOW (ref 3.5–5.1)
Sodium: 135 mmol/L (ref 135–145)

## 2020-05-09 LAB — CBC
HCT: 34.5 % — ABNORMAL LOW (ref 39.0–52.0)
Hemoglobin: 11.2 g/dL — ABNORMAL LOW (ref 13.0–17.0)
MCH: 30.4 pg (ref 26.0–34.0)
MCHC: 32.5 g/dL (ref 30.0–36.0)
MCV: 93.8 fL (ref 80.0–100.0)
Platelets: 176 10*3/uL (ref 150–400)
RBC: 3.68 MIL/uL — ABNORMAL LOW (ref 4.22–5.81)
RDW: 15.9 % — ABNORMAL HIGH (ref 11.5–15.5)
WBC: 7.4 10*3/uL (ref 4.0–10.5)
nRBC: 0 % (ref 0.0–0.2)

## 2020-05-09 LAB — TROPONIN I (HIGH SENSITIVITY)
Troponin I (High Sensitivity): 6 ng/L (ref <18)
Troponin I (High Sensitivity): 10 ng/L (ref <18)

## 2020-05-09 MED ORDER — ASPIRIN 81 MG PO CHEW
324.0000 mg | CHEWABLE_TABLET | Freq: Once | ORAL | Status: AC
  Administered 2020-05-09: 324 mg via ORAL
  Filled 2020-05-09: qty 4

## 2020-05-09 NOTE — Discharge Instructions (Addendum)
You were evaluated in the Emergency Department and after careful evaluation, we did not find any emergent condition requiring admission or further testing in the hospital.  Your exam/testing today was overall reassuring.  Your blood test did not show any heart damage.  We suspect that your pain is related to inflammation or strain of the chest wall muscles or ribs.  Still we recommend that you follow-up with your cardiologist and discuss your symptoms.  Please return to the Emergency Department if you experience any worsening of your condition.  We encourage you to follow up with a primary care provider.  Thank you for allowing [place] to be a part of your care.

## 2020-05-09 NOTE — ED Triage Notes (Signed)
PT has felt dizzy of and on since he went to church this AM. No vomiting ,no chills ,fever. No leg swelling.

## 2020-05-09 NOTE — ED Notes (Signed)
Report given Shawn at [facility]

## 2020-05-09 NOTE — ED Triage Notes (Signed)
Pt here from independent living ([facility] ) with c/o left sided chest pain and some dizziness this morning at church ,

## 2020-05-09 NOTE — ED Provider Notes (Signed)
[facility] Emergency Department Provider Note MRN:  [id number]  Arrival date & time: 05/09/20     Chief Complaint   Chest pain History of Present Illness   [name] is a 86 y.o. year-old male with a history of CAD status post CABG presenting to the ED with chief complaint of chest pain.  Patient explains that he went to church this morning and began feeling lightheaded.  Later in the day began having chest discomfort on the left side, very mild, present when pushing on the ribs.  Denies diaphoresis, no nausea, no vomiting, no shortness of breath, no leg pain or swelling.  Pain is intermittent, mild, no other exacerbating or alleviating factors.  Review of Systems  A complete 10 system review of systems was obtained and all systems are negative except as noted in the HPI and PMH.   Patient's Health History    Past Medical History:  Diagnosis Date  . Anxiety   . Atrial fibrillation ([place])   . Blood transfusion without reported diagnosis   . CAD    a. 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  . Cataract   . Chronic abdominal pain   . Chronic nausea   . Coronary artery disease   . Diverticulosis 2011   Diverticulitis 2004  . Dyspepsia   . Esophageal reflux   . GERD (gastroesophageal reflux disease)   . Headache    saw neuro 4-16, MRI done  . HYPERPLASIA, PROSTATE NOS W/URINARY OBST/LUTS   . IBS   . INGUINAL HERNIA   . Lichen planus [id number]   [name]  . LUMBAR RADICULOPATHY, RIGHT   . Mixed hyperlipidemia   . OSTEOPOROSIS   . PONV (postoperative nausea and vomiting)   . PREMATURE VENTRICULAR CONTRACTIONS    a. After CABG - was on Coumadin/Multaq for a period of time. Coumadin discontinued 04/2010 after maintaining NSR.  [name] UNSPECIFIED ANEMIA     Past Surgical History:  Procedure Laterality Date  . BIOPSY  09/10/2018   Procedure: BIOPSY;  Surgeon: Jerene Bears, MD;   Location: [facility];  Service: Gastroenterology;;  . CATARACT EXTRACTION Right 10/06/2013  . CORONARY ARTERY BYPASS GRAFT     2 VD;anomalous RCA;post op compliacted by AF  . ESOPHAGOGASTRODUODENOSCOPY (EGD) WITH PROPOFOL N/A 09/10/2018   Procedure: ESOPHAGOGASTRODUODENOSCOPY (EGD) WITH PROPOFOL;  Surgeon: Jerene Bears, MD;  Location: [facility];  Service: Gastroenterology;  Laterality: N/A;  . HIP FRACTURE SURGERY Right 03/2007   Dr. Salvadore Farber  . INGUINAL HERNIA REPAIR Left 2012   Dr Brantley Stage  . KNEE SURGERY Right 1977   repair  . PROSTATE BIOPSY  09/19/2013   [facility]    Family History  Problem Relation Age of Onset  . Stroke Mother   . Pancreatic cancer Father        Deceased, 33  . Breast cancer Sister        Deceased  . Heart disease Sister 29  . Stroke Maternal Aunt   . Colon cancer Neg Hx   . Prostate cancer Neg Hx   . Esophageal cancer Neg Hx   . Rectal cancer Neg Hx   . Stomach cancer Neg Hx     Social History   Socioeconomic History  . Marital status: Widowed    Spouse name: Not on file  . Number of children: 0  . Years of education: Not on file  .  Highest education level: Not on file  Occupational History  . Occupation: retired    Fish farm manager: RETIRED  Tobacco Use  . Smoking status: Former Smoker    Types: Cigarettes    Quit date: 11/27/1962    Years since quitting: 57.4  . Smokeless tobacco: Never Used  Vaping Use  . Vaping Use: Never used  Substance and Sexual Activity  . Alcohol use: Not Currently    Alcohol/week: 0.0 standard drinks    Comment: quit drinking   . Drug use: No  . Sexual activity: Not Currently  Other Topics Concern  . Not on file  Social History Narrative   Lives at [facility] in independent living.     Lost wife 06-2016.     Has no children.  Family: nephews-nices in [place]   Retired from [employer].     Still drives.    Social Determinants of Health   Financial Resource Strain:   . Difficulty of Paying  Living Expenses:   Food Insecurity:   . Worried About Charity fundraiser in the Last Year:   . Arboriculturist in the Last Year:   Transportation Needs:   . Film/video editor (Medical):   [name] Lack of Transportation (Non-Medical):   Physical Activity:   . Days of Exercise per Week:   . Minutes of Exercise per Session:   Stress:   . Feeling of Stress :   Social Connections:   . Frequency of Communication with Friends and Family:   . Frequency of Social Gatherings with Friends and Family:   . Attends Religious Services:   . Active Member of Clubs or Organizations:   . Attends Archivist Meetings:   [name] Marital Status:   Intimate Partner Violence:   . Fear of Current or Ex-Partner:   . Emotionally Abused:   [name] Physically Abused:   . Sexually Abused:      Physical Exam   Vitals:   05/09/20 2038 05/09/20 2130  BP: (!) 174/69 (!) 161/68  Pulse: 63 61  Resp: (!) 23 16  Temp:    SpO2: 100% 100%    CONSTITUTIONAL: Well-appearing, NAD NEURO:  Alert and oriented x 3, no focal deficits EYES:  eyes equal and reactive ENT/NECK:  no LAD, no JVD CARDIO: Regular rate, well-perfused, normal S1 and S2 PULM:  CTAB no wheezing or rhonchi GI/GU:  normal bowel sounds, non-distended, non-tender MSK/SPINE:  No gross deformities, no edema SKIN:  no rash, atraumatic PSYCH:  Appropriate speech and behavior  *Additional and/or pertinent findings included in MDM below  Diagnostic and Interventional Summary    EKG Interpretation  Date/Time:  Sunday May 09 2020 18:58:22 EDT Ventricular Rate:  66 PR Interval:    QRS Duration: 142 QT Interval:  427 QTC Calculation: 448 R Axis:   51 Text Interpretation: Sinus rhythm Right bundle branch block Confirmed by Gerlene Fee [phone]) on 05/09/2020 7:03:53 PM      Labs Reviewed  CBC - Abnormal; Notable for the following components:      Result Value   RBC 3.68 (*)    Hemoglobin 11.2 (*)    HCT 34.5 (*)    RDW 15.9 (*)    All  other components within normal limits  BASIC METABOLIC PANEL - Abnormal; Notable for the following components:   Potassium 3.3 (*)    Calcium 7.9 (*)    All other components within normal limits  TROPONIN I (HIGH SENSITIVITY)  TROPONIN I (HIGH SENSITIVITY)  DG Chest Port 1 View  Final Result      Medications  aspirin chewable tablet 324 mg (324 mg Oral Given 05/09/20 2008)     Procedures  /  Critical Care Procedures  ED Course and Medical Decision Making  I have reviewed the triage vital signs, the nursing notes, and pertinent available records from the EMR.  Listed above are laboratory and imaging tests that I personally ordered, reviewed, and interpreted and then considered in my medical decision making (see below for details).      Chest pain, history of CAD and CABG, pain is atypical, not described as a pressure, described more as a funny feeling, pain is reproducible with palpation to a very localized spot on the ribs, does not seem to be present when not palpating this region.  EKG is with unchanged right bundle branch block.  Will evaluate with troponins, if reassuring I feel patient would be safe for discharge and close cardiology follow-up.    Barth Kirks. Sedonia Small, [place] [facility] [email or web address]  Final Clinical Impressions(s) / ED Diagnoses     ICD-10-CM   1. Chest pain, unspecified type  R07.9     ED Discharge Orders    None       Discharge Instructions Discussed with and Provided to Patient:     Discharge Instructions     You were evaluated in the Emergency Department and after careful evaluation, we did not find any emergent condition requiring admission or further testing in the hospital.  Your exam/testing today was overall reassuring.  Your blood test did not show any heart damage.  We suspect that your pain is related to inflammation or strain of the chest wall muscles or ribs.  Still we recommend that  you follow-up with your cardiologist and discuss your symptoms.  Please return to the Emergency Department if you experience any worsening of your condition.  We encourage you to follow up with a primary care provider.  Thank you for allowing [place] to be a part of your care.        Maudie Flakes, MD 05/09/20 [phone]

## 2020-05-09 NOTE — ED Notes (Signed)
Verbalized understanding of d/c instructions, follow up care and s/s requiring return to ed. Pt had no further questions at this time. Pt transported to exit via wheelchair.

## 2020-05-10 ENCOUNTER — Other Ambulatory Visit (HOSPITAL_COMMUNITY): Payer: Self-pay | Admitting: Cardiovascular Disease

## 2020-05-10 DIAGNOSIS — I2510 Atherosclerotic heart disease of native coronary artery without angina pectoris: Secondary | ICD-10-CM

## 2020-05-10 DIAGNOSIS — R079 Chest pain, unspecified: Secondary | ICD-10-CM

## 2020-05-14 ENCOUNTER — Other Ambulatory Visit: Payer: Self-pay

## 2020-05-14 ENCOUNTER — Encounter: Payer: Self-pay | Admitting: Internal Medicine

## 2020-05-14 ENCOUNTER — Ambulatory Visit (INDEPENDENT_AMBULATORY_CARE_PROVIDER_SITE_OTHER): Payer: Medicare Other | Admitting: Internal Medicine

## 2020-05-14 VITALS — BP 138/74 | HR 68 | Temp 97.4°F | Resp 18 | Ht 74.0 in | Wt 157.5 lb

## 2020-05-14 DIAGNOSIS — Z09 Encounter for follow-up examination after completed treatment for conditions other than malignant neoplasm: Secondary | ICD-10-CM

## 2020-05-14 DIAGNOSIS — R079 Chest pain, unspecified: Secondary | ICD-10-CM | POA: Diagnosis not present

## 2020-05-14 DIAGNOSIS — R42 Dizziness and giddiness: Secondary | ICD-10-CM

## 2020-05-14 MED ORDER — SHINGRIX 50 MCG/0.5ML IM SUSR: 0.5000 mL | Freq: Once | INTRAMUSCULAR | 1 refills | Status: AC

## 2020-05-14 NOTE — Progress Notes (Signed)
Subjective:    Patient ID: [name], male    DOB: October 24, 1930, 86 y.o.   MRN: [id number]  DOS:  05/14/2020 Type of visit - description: ER follow-up Went  to the ER 05/09/2020, he was at church and developed lightheadedness on and off, later on developed a left-sided chest discomfort. He called the nurse at the facility he lives at home according to the patient BP was in the 200s. Went to the ER, initially BP was 174/69, then 161/ 68.  Work-up including potassium of 3.3, hemoglobin of 11.2 (lower than before) , 2 neg troponins, nonacute chest x-ray and a nonacute EKG.  Since that day, all symptoms are gone.  BP Readings from Last 3 Encounters:  05/14/20 138/74  05/09/20 (!) 153/82  03/24/20 123/60     Review of Systems Denies lower extremity edema, did have some palpitations   No nausea, vomiting, diarrhea.  No blood in the stool No GERD No cough No headache, diplopia, slurred speech or motor deficits.   Past Medical History:  Diagnosis Date  . Anxiety   . Atrial fibrillation ([place])   . Blood transfusion without reported diagnosis   . CAD    a. 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  . Cataract   . Chronic abdominal pain   . Chronic nausea   . Coronary artery disease   . Diverticulosis 2011   Diverticulitis 2004  . Dyspepsia   . Esophageal reflux   . GERD (gastroesophageal reflux disease)   . Headache    saw neuro 4-16, MRI done  . HYPERPLASIA, PROSTATE NOS W/URINARY OBST/LUTS   . IBS   . INGUINAL HERNIA   . Lichen planus [id number]   [name]  . LUMBAR RADICULOPATHY, RIGHT   . Mixed hyperlipidemia   . OSTEOPOROSIS   . PONV (postoperative nausea and vomiting)   . PREMATURE VENTRICULAR CONTRACTIONS    a. After CABG - was on Coumadin/Multaq for a period of time. Coumadin discontinued 04/2010 after maintaining NSR.  [name] UNSPECIFIED ANEMIA     Past Surgical History:  Procedure  Laterality Date  . BIOPSY  09/10/2018   Procedure: BIOPSY;  Surgeon: Jerene Bears, MD;  Location: [facility];  Service: Gastroenterology;;  . CATARACT EXTRACTION Right 10/06/2013  . CORONARY ARTERY BYPASS GRAFT     2 VD;anomalous RCA;post op compliacted by AF  . ESOPHAGOGASTRODUODENOSCOPY (EGD) WITH PROPOFOL N/A 09/10/2018   Procedure: ESOPHAGOGASTRODUODENOSCOPY (EGD) WITH PROPOFOL;  Surgeon: Jerene Bears, MD;  Location: [facility];  Service: Gastroenterology;  Laterality: N/A;  . HIP FRACTURE SURGERY Right 03/2007   Dr. Salvadore Farber  . INGUINAL HERNIA REPAIR Left 2012   Dr Brantley Stage  . KNEE SURGERY Right 1977   repair  . PROSTATE BIOPSY  09/19/2013   [facility]    Allergies as of 05/14/2020      Reactions   Amoxicillin Diarrhea, Nausea And Vomiting, Other (See Comments)   Patient questions this   Zoloft [sertraline Hcl]    Patient didn't like the way it made him feel   Remeron [mirtazapine] Other (See Comments)   Caused excessive drowsiness      Medication List       Accurate as of May 14, 2020 11:59 PM. If you have any questions, ask your nurse or doctor.        acetaminophen 650 MG CR tablet Commonly known as: TYLENOL Take 325-650 mg  by mouth every 8 (eight) hours as needed for pain.   aspirin EC 81 MG tablet Take 81 mg by mouth daily.   calcium carbonate 500 MG chewable tablet Commonly known as: TUMS - dosed in mg elemental calcium Chew 1 tablet by mouth daily.   Eliquis 5 MG Tabs tablet Generic drug: apixaban TAKE ONE (1) TABLET BY MOUTH TWO (2) TIMES DAILY What changed: See the new instructions.   folic acid 1 MG tablet Commonly known as: FOLVITE TAKE ONE (1) TABLET BY MOUTH EACH DAY What changed: See the new instructions.   loratadine 10 MG tablet Commonly known as: CLARITIN Take 0.5-1 tablets (5-10 mg total) by mouth daily as needed for allergies. What changed:   how much to take  reasons to take this   multivitamin with minerals Tabs  tablet Take 1 tablet by mouth daily.   nitroGLYCERIN 0.4 MG SL tablet Commonly known as: NITROSTAT Place 1 tablet (0.4 mg total) under the tongue every 5 (five) minutes x 3 doses as needed for chest pain.   pantoprazole 40 MG tablet Commonly known as: PROTONIX Take 1 tablet (40 mg total) by mouth daily before breakfast. What changed:   when to take this  reasons to take this   rosuvastatin 10 MG tablet Commonly known as: CRESTOR TAKE 1 TABLET BY MOUTH DAILY What changed: when to take this   Shingrix injection Generic drug: Zoster Vaccine Adjuvanted Inject 0.5 mLs into the muscle once for 1 dose. Started by: Kathlene November, MD   Vitamin D-3 25 MCG (1000 UT) Caps Take 1,000 Units by mouth daily with breakfast.          Objective:   Physical Exam BP 138/74 (BP Location: Left Arm, Patient Position: Sitting, Cuff Size: Small)   Pulse 68   Temp (!) 97.4 F (36.3 C) (Temporal)   Resp 18   Ht 6\' 2"  (1.88 m)   Wt 157 lb 8 oz (71.4 kg)   SpO2 96%   BMI 20.22 kg/m  General:   Well developed, NAD, BMI noted. HEENT:  Normocephalic . Face symmetric, atraumatic Neck: Normal carotid pulses Lungs:  CTA B Normal respiratory effort, no intercostal retractions, no accessory muscle use. Heart: RRR,  no murmur.  Lower extremities: no pretibial edema bilaterally  Skin: Not pale. Not jaundice Neurologic:  alert & oriented X3.  Speech normal, gait appropriate for age and unassisted. EOMI, pupils reactive and symmetric. Psych--  Cognition and judgment appear intact.  Cooperative with normal attention span and concentration.  Behavior appropriate. No anxious or depressed appearing.      Assessment      Assessment Prediabetes, A1c 6.0 (2015) Hyperlipidemia Anxiety, depression:  -intol to zoloft, Rx lexapro 5 mg 07-2015: intolerant d/t nausea  -lost wife July 2017 CV: ---CAD--CABG 2011 ----P- Atrial fibrillation, PVCs.  Elliquis ; Multaq d/c d/t GI s/e ----Palpable Ao x  years, [place] (-) for AAA 2005 COPD (per CXR 06-2015), PFTs 10-04-15 mild obstruction DJD  Gait-- uses a walker prn GI: --GERD, IBS, diverticulosis --Chronic nausea: 2019.  Work-up: 08-2018: Negative EGD, CT  and US abdomen. 12/2018 wnl Gastric empty stomach.  Symptoms decreased after Multaq stopped GU: elevated PSA, BPH , (-) bx 2014 Osteoporosis: -hip FX 2008 - dexa ~2005 --> rx fosamax, took x 1 year, dexa ~2010 was rec no fosamax at that time. Dexa 09-2015: T score -2.4 --> rx fosamax, d/c d/t jaw pain 06-2017; took prolia #25 February 2018; T score (-) 30 December 2018, per  Endo Lichen planus Headache -- saw neurology 02-2015, had a MRI/MRA (-)  PLAN Chest pain, lightheadedness: As described above, resolved within 24 hours, ER W/U neg except for slightly low potassium and hemoglobin.  Currently asx; had similar episode in April, saw cardiology and a Myoview is pending for next week. Reportedly, BP was quite elevated at the time of the symptoms thus will start checking ambulatory BPs. Plan: Recheck BMP, CBC, ER if severe chest pain return.  Check BPs 3 times a week. Preventive care: Request Shingrix, Rx printed.   This visit occurred during the SARS-CoV-2 public health emergency.  Safety protocols were in place, including screening questions prior to the visit, additional usage of staff PPE, and extensive cleaning of exam room while observing appropriate contact time as indicated for disinfecting solutions.

## 2020-05-14 NOTE — Progress Notes (Signed)
Pre visit review using our clinic review tool, if applicable. No additional management support is needed unless otherwise documented below in the visit note. 

## 2020-05-14 NOTE — Patient Instructions (Signed)
  Check the  blood pressure 3 times a week BP GOAL is between 110/65 and  135/85. If it is consistently higher or lower, let me know   GO TO THE LAB : Get the blood work    If you have severe chest pain: Go to the ER again.

## 2020-05-15 LAB — CBC WITH DIFFERENTIAL/PLATELET
Absolute Monocytes: 816 cells/uL (ref 200–950)
Basophils Absolute: 106 cells/uL (ref 0–200)
Basophils Relative: 1.1 %
Eosinophils Absolute: 442 cells/uL (ref 15–500)
Eosinophils Relative: 4.6 %
HCT: 40.6 % (ref 38.5–50.0)
Hemoglobin: 13.5 g/dL (ref 13.2–17.1)
Lymphs Abs: 1901 cells/uL (ref 850–3900)
MCH: 31.1 pg (ref 27.0–33.0)
MCHC: 33.3 g/dL (ref 32.0–36.0)
MCV: 93.5 fL (ref 80.0–100.0)
MPV: 12.4 fL (ref 7.5–12.5)
Monocytes Relative: 8.5 %
Neutro Abs: 6336 cells/uL (ref 1500–7800)
Neutrophils Relative %: 66 %
Platelets: 210 10*3/uL (ref 140–400)
RBC: 4.34 10*6/uL (ref 4.20–5.80)
RDW: 13.5 % (ref 11.0–15.0)
Total Lymphocyte: 19.8 %
WBC: 9.6 10*3/uL (ref 3.8–10.8)

## 2020-05-15 LAB — BASIC METABOLIC PANEL
BUN: 19 mg/dL (ref 7–25)
CO2: 27 mmol/L (ref 20–32)
Calcium: 10.4 mg/dL — ABNORMAL HIGH (ref 8.6–10.3)
Chloride: 98 mmol/L (ref 98–110)
Creat: 0.85 mg/dL (ref 0.70–1.11)
Glucose, Bld: 106 mg/dL — ABNORMAL HIGH (ref 65–99)
Potassium: 5.7 mmol/L — ABNORMAL HIGH (ref 3.5–5.3)
Sodium: 136 mmol/L (ref 135–146)

## 2020-05-16 NOTE — Assessment & Plan Note (Signed)
Chest pain, lightheadedness: As described above, resolved within 24 hours, ER W/U neg except for slightly low potassium and hemoglobin.  Currently asx; had similar episode in April, saw cardiology and a Myoview is pending for next week. Reportedly, BP was quite elevated at the time of the symptoms thus will start checking ambulatory BPs. Plan: Recheck BMP, CBC, ER if severe chest pain return.  Check BPs 3 times a week. Preventive care: Request Shingrix, Rx printed.

## 2020-05-18 ENCOUNTER — Telehealth (HOSPITAL_COMMUNITY): Payer: Self-pay | Admitting: *Deleted

## 2020-05-18 NOTE — Telephone Encounter (Signed)
Patient given detailed instructions per Myocardial Perfusion Study Information Sheet for the test on 6/'25/21. Patient notified to arrive 15 minutes early and that it is imperative to arrive on time for appointment to keep from having the test rescheduled.  If you need to cancel or reschedule your appointment, please call the office within 24 hours of your appointment. . Patient verbalized understanding. Kirstie Peri

## 2020-05-21 ENCOUNTER — Other Ambulatory Visit: Payer: Self-pay

## 2020-05-21 ENCOUNTER — Ambulatory Visit (HOSPITAL_COMMUNITY): Payer: Medicare Other | Attending: Cardiology

## 2020-05-21 ENCOUNTER — Telehealth: Payer: Self-pay | Admitting: Internal Medicine

## 2020-05-21 DIAGNOSIS — R079 Chest pain, unspecified: Secondary | ICD-10-CM | POA: Diagnosis not present

## 2020-05-21 DIAGNOSIS — I2510 Atherosclerotic heart disease of native coronary artery without angina pectoris: Secondary | ICD-10-CM | POA: Diagnosis not present

## 2020-05-21 LAB — MYOCARDIAL PERFUSION IMAGING
LV dias vol: 88 mL (ref 62–150)
LV sys vol: 37 mL
Peak HR: 91 {beats}/min
Rest HR: 67 {beats}/min
SDS: 0
SRS: 0
SSS: 0
TID: 1.25

## 2020-05-21 MED ORDER — REGADENOSON 0.4 MG/5ML IV SOLN
0.4000 mg | Freq: Once | INTRAVENOUS | Status: AC
  Administered 2020-05-21: 0.4 mg via INTRAVENOUS

## 2020-05-21 MED ORDER — TECHNETIUM TC 99M TETROFOSMIN IV KIT
33.0000 | PACK | Freq: Once | INTRAVENOUS | Status: AC | PRN
  Administered 2020-05-21: 33 via INTRAVENOUS
  Filled 2020-05-21: qty 33

## 2020-05-21 MED ORDER — TECHNETIUM TC 99M TETROFOSMIN IV KIT
10.4000 | PACK | Freq: Once | INTRAVENOUS | Status: AC | PRN
  Administered 2020-05-21: 10.4 via INTRAVENOUS
  Filled 2020-05-21: qty 11

## 2020-05-21 NOTE — Telephone Encounter (Signed)
Caller : [place]   Patient is calling in reference to re-testing for lab.

## 2020-05-21 NOTE — Telephone Encounter (Signed)
Spoke w/ Pt- he received a voicemail from Dr. Larose Kells regarding his high potassium- informed Pt of result and informed that we would recheck next week at his appt. Pt verbalized understanding.

## 2020-05-27 ENCOUNTER — Ambulatory Visit (INDEPENDENT_AMBULATORY_CARE_PROVIDER_SITE_OTHER): Payer: Medicare Other | Admitting: Internal Medicine

## 2020-05-27 ENCOUNTER — Other Ambulatory Visit: Payer: Self-pay

## 2020-05-27 ENCOUNTER — Encounter: Payer: Self-pay | Admitting: Internal Medicine

## 2020-05-27 VITALS — BP 153/69 | HR 62 | Temp 97.0°F | Resp 18 | Ht 74.0 in | Wt 156.4 lb

## 2020-05-27 DIAGNOSIS — Z0000 Encounter for general adult medical examination without abnormal findings: Secondary | ICD-10-CM | POA: Diagnosis not present

## 2020-05-27 DIAGNOSIS — E782 Mixed hyperlipidemia: Secondary | ICD-10-CM

## 2020-05-27 DIAGNOSIS — R739 Hyperglycemia, unspecified: Secondary | ICD-10-CM | POA: Diagnosis not present

## 2020-05-27 DIAGNOSIS — E875 Hyperkalemia: Secondary | ICD-10-CM | POA: Diagnosis not present

## 2020-05-27 DIAGNOSIS — R399 Unspecified symptoms and signs involving the genitourinary system: Secondary | ICD-10-CM

## 2020-05-27 LAB — BASIC METABOLIC PANEL
BUN: 13 mg/dL (ref 6–23)
CO2: 29 mEq/L (ref 19–32)
Calcium: 9.7 mg/dL (ref 8.4–10.5)
Chloride: 99 mEq/L (ref 96–112)
Creatinine, Ser: 0.75 mg/dL (ref 0.40–1.50)
GFR: 98.36 mL/min (ref >60.00)
Glucose, Bld: 92 mg/dL (ref 70–99)
Potassium: 5.1 mEq/L (ref 3.5–5.1)
Sodium: 136 mEq/L (ref 135–145)

## 2020-05-27 LAB — HEMOGLOBIN A1C: Hgb A1c MFr Bld: 5.8 % (ref 4.6–6.5)

## 2020-05-27 LAB — URINALYSIS, ROUTINE W REFLEX MICROSCOPIC
Bilirubin Urine: NEGATIVE
Ketones, ur: NEGATIVE
Nitrite: NEGATIVE
Specific Gravity, Urine: 1.01 (ref 1.000–1.030)
Total Protein, Urine: NEGATIVE
Urine Glucose: NEGATIVE
Urobilinogen, UA: 0.2 (ref 0.0–1.0)
pH: 6.5 (ref 5.0–8.0)

## 2020-05-27 LAB — PSA: PSA: 5.18 ng/mL — ABNORMAL HIGH (ref 0.10–4.00)

## 2020-05-27 LAB — LIPID PANEL
Cholesterol: 101 mg/dL (ref 0–200)
HDL: 59.5 mg/dL (ref >39.00)
LDL Cholesterol: 20 mg/dL (ref 0–99)
NonHDL: 41.84
Total CHOL/HDL Ratio: 2
Triglycerides: 107 mg/dL (ref 0.0–149.0)
VLDL: 21.4 mg/dL (ref 0.0–40.0)

## 2020-05-27 NOTE — Progress Notes (Signed)
Subjective:    Patient ID: [name], male    DOB: [birth year], 86 y.o.   MRN: [id number]  DOS:  05/27/2020 Type of visit - description: CPX Since the last office visit he is doing okay. Has chronic LUTS, over the years is getting worse, reports slow flow but no dysuria or gross hematuria.  Symptoms are not very bothersome.  Review of Systems  Other than above, a 14 point review of systems is negative     Past Medical History:  Diagnosis Date  . Anxiety   . Atrial fibrillation ([place])   . Blood transfusion without reported diagnosis   . CAD    a. 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  . Cataract   . Chronic abdominal pain   . Chronic nausea   . Coronary artery disease   . Diverticulosis 2011   Diverticulitis 2004  . Dyspepsia   . Esophageal reflux   . GERD (gastroesophageal reflux disease)   . Headache    saw neuro 4-16, MRI done  . HYPERPLASIA, PROSTATE NOS W/URINARY OBST/LUTS   . IBS   . INGUINAL HERNIA   . Lichen planus [id number]   [name]  . LUMBAR RADICULOPATHY, RIGHT   . Mixed hyperlipidemia   . OSTEOPOROSIS   . PONV (postoperative nausea and vomiting)   . PREMATURE VENTRICULAR CONTRACTIONS    a. After CABG - was on Coumadin/Multaq for a period of time. Coumadin discontinued 04/2010 after maintaining NSR.  [name] UNSPECIFIED ANEMIA     Past Surgical History:  Procedure Laterality Date  . BIOPSY  09/10/2018   Procedure: BIOPSY;  Surgeon: Jerene Bears, MD;  Location: [facility];  Service: Gastroenterology;;  . CATARACT EXTRACTION Right 10/06/2013  . CORONARY ARTERY BYPASS GRAFT     2 VD;anomalous RCA;post op compliacted by AF  . ESOPHAGOGASTRODUODENOSCOPY (EGD) WITH PROPOFOL N/A 09/10/2018   Procedure: ESOPHAGOGASTRODUODENOSCOPY (EGD) WITH PROPOFOL;  Surgeon: Jerene Bears, MD;  Location: [facility];  Service: Gastroenterology;  Laterality: N/A;  . HIP FRACTURE SURGERY Right 03/2007    Dr. Salvadore Farber  . INGUINAL HERNIA REPAIR Left 2012   Dr Brantley Stage  . KNEE SURGERY Right 1977   repair  . PROSTATE BIOPSY  09/19/2013   [facility]    Allergies as of 05/27/2020      Reactions   Amoxicillin Diarrhea, Nausea And Vomiting, Other (See Comments)   Patient questions this   Zoloft [sertraline Hcl]    Patient didn't like the way it made him feel   Remeron [mirtazapine] Other (See Comments)   Caused excessive drowsiness      Medication List       Accurate as of May 27, 2020 11:59 PM. If you have any questions, ask your nurse or doctor.        acetaminophen 650 MG CR tablet Commonly known as: TYLENOL Take 325-650 mg by mouth every 8 (eight) hours as needed for pain.   aspirin EC 81 MG tablet Take 81 mg by mouth daily.   calcium carbonate 500 MG chewable tablet Commonly known as: TUMS - dosed in mg elemental calcium Chew 1 tablet by mouth daily.   Eliquis 5 MG Tabs tablet Generic drug: apixaban TAKE ONE (1) TABLET BY MOUTH TWO (2) TIMES DAILY What changed: See the new instructions.   folic acid 1 MG tablet Commonly known as: FOLVITE TAKE ONE (1) TABLET BY MOUTH EACH  DAY What changed: See the new instructions.   loratadine 10 MG tablet Commonly known as: CLARITIN Take 0.5-1 tablets (5-10 mg total) by mouth daily as needed for allergies. What changed:   how much to take  reasons to take this   multivitamin with minerals Tabs tablet Take 1 tablet by mouth daily.   nitroGLYCERIN 0.4 MG SL tablet Commonly known as: NITROSTAT Place 1 tablet (0.4 mg total) under the tongue every 5 (five) minutes x 3 doses as needed for chest pain.   pantoprazole 40 MG tablet Commonly known as: PROTONIX Take 1 tablet (40 mg total) by mouth daily before breakfast. What changed:   when to take this  reasons to take this   rosuvastatin 10 MG tablet Commonly known as: CRESTOR TAKE 1 TABLET BY MOUTH DAILY What changed: when to take this   Vitamin D-3 25 MCG  (1000 UT) Caps Take 1,000 Units by mouth daily with breakfast.          Objective:   Physical Exam BP (!) 153/69 (BP Location: Left Arm, Patient Position: Sitting, Cuff Size: Small)   Pulse 62   Temp (!) 97 F (36.1 C) (Temporal)   Resp 18   Ht 6\' 2"  (1.88 m)   Wt 156 lb 6 oz (70.9 kg)   SpO2 97%   BMI 20.08 kg/m  General: Well developed, NAD, BMI noted Neck: No  thyromegaly  HEENT:  Normocephalic . Face symmetric, atraumatic Lungs:  CTA B Normal respiratory effort, no intercostal retractions, no accessory muscle use. Heart: RRR,  no murmur.  Abdomen:  Not distended, soft, non-tender. No rebound or rigidity.   Lower extremities: no pretibial edema bilaterally DRE: Right side of the prostate is moderately enlarged but not nodular.  It is a slightly tender compared to the left side but is soft and not hard. Skin: Exposed areas without rash. Not pale. Not jaundice Neurologic:  alert & oriented X3.  Speech normal, gait appropriate for age and unassisted Strength symmetric and appropriate for age.  Psych: Cognition and judgment appear intact.  Cooperative with normal attention span and concentration.  Behavior appropriate. No anxious or depressed appearing.     Assessment      Assessment Prediabetes, A1c 6.0 (2015) Hyperlipidemia Anxiety, depression:  -intol to zoloft, Rx lexapro 5 mg 07-2015: intolerant d/t nausea  -lost wife July 2017 CV: ---CAD--CABG 2011 ----P- Atrial fibrillation, PVCs.  Elliquis ; Multaq d/c d/t GI s/e ----Palpable Ao x years, [place] (-) for AAA 2005 COPD (per CXR 06-2015), PFTs 10-04-15 mild obstruction DJD  Gait-- uses a walker prn GI: --GERD, IBS, diverticulosis --Chronic nausea: 2019.  Work-up: 08-2018: Negative EGD, CT  and US abdomen. 12/2018 wnl Gastric empty stomach.  Symptoms decreased after Multaq stopped GU: elevated PSA, BPH , (-) bx 2014 Osteoporosis: -hip FX 2008 - dexa ~2005 --> rx fosamax, took x 1 year, dexa ~2010 was rec no  fosamax at that time. Dexa 09-2015: T score -2.4 --> rx fosamax, d/c d/t jaw pain 06-2017; took prolia #25 February 2018; T score (-) 31 December 5359, per Endo Lichen planus Headache -- saw neurology 02-2015, had a MRI/MRA (-)  PLAN Elevated BP: BP today slightly elevated at home is consistently 120/60 or better Pre- diabetes: Check A1c Hyperlipidemia: Checking labs, continue Crestor Hyperkalemia: Last blood work show a potassium of 5.7, he was advised about low potassium diet which he is following.  Not taking any OTCs, salt substitutes or Ensure.  Recheck labs today.  Chest pain, lightheadedness: See last visit, stress test Myoview normal 05/21/2020 BPH, LUTS: Has a history of a negative biopsy in 2014, has not seen urology in years, symptoms are gradually increasing but not severe.  He is concerned about prostate cancer although he said he is not sure if he would accept treatment. DRE today is asymmetric. Plan: Check a UA, urine culture.  We will also check a PSA, mostly to see if he is very high and could indicate prostatitis  (ABX?) Also, consider Flomax  RTC 4 months    In addition to CPX we evaluated him for chronic medical problems and a worsening problem (BPH).  This visit occurred during the SARS-CoV-2 public health emergency.  Safety protocols were in place, including screening questions prior to the visit, additional usage of staff PPE, and extensive cleaning of exam room while observing appropriate contact time as indicated for disinfecting solutions.

## 2020-05-27 NOTE — Patient Instructions (Addendum)
Continue with your low-potassium diet  Please bring a copy of your healthcare power of attorney  If your prostate symptoms get worse let me know  GO TO THE LAB : Get the blood work     [place], [place] back for a checkup in 4 months.

## 2020-05-27 NOTE — Progress Notes (Signed)
Pre visit review using our clinic review tool, if applicable. No additional management support is needed unless otherwise documented below in the visit note. 

## 2020-05-28 ENCOUNTER — Encounter: Payer: Self-pay | Admitting: Internal Medicine

## 2020-05-28 LAB — URINE CULTURE
MICRO NUMBER:: 10657770
Result:: NO GROWTH
SPECIMEN QUALITY:: ADEQUATE

## 2020-05-28 MED ORDER — TAMSULOSIN HCL 0.4 MG PO CAPS
0.4000 mg | ORAL_CAPSULE | Freq: Every day | ORAL | 0 refills | Status: DC
Start: 2020-05-28 — End: 2020-08-31

## 2020-05-28 NOTE — Assessment & Plan Note (Signed)
Elevated BP: BP today slightly elevated at home is consistently 120/60 or better Pre- diabetes: Check A1c Hyperlipidemia: Checking labs, continue Crestor Hyperkalemia: Last blood work show a potassium of 5.7, he was advised about low potassium diet which he is following.  Not taking any OTCs, salt substitutes or Ensure.  Recheck labs today.   Chest pain, lightheadedness: See last visit, stress test Myoview normal 05/21/2020 BPH, LUTS: Has a history of a negative biopsy in 2014, has not seen urology in years, symptoms are gradually increasing but not severe.  He is concerned about prostate cancer although he said he is not sure if he would accept treatment. DRE today is asymmetric. Plan: Check a UA, urine culture.  We will also check a PSA, mostly to see if he is very high and could indicate prostatitis  (ABX?) Also, consider Flomax  RTC 4 months

## 2020-05-28 NOTE — Assessment & Plan Note (Signed)
-  Tdap 2012 -PNA-- 05/17/15; prevnar 2016 -Had 2 Covid shots - shingrix prescription printed before -Rec yearly flu shot -CCS, prostate ca screening: : no further screenings; pt confirmed his decision -Prostate cancer screening: See comments under BPH - diet -exercise discussed - has a health care power of attorney, asked pt to send a copy -Labs: BMP FLP A1c UA urine culture PSA

## 2020-05-28 NOTE — Addendum Note (Signed)
Addended byDamita Dunnings D on: 05/28/2020 01:27 PM   Modules accepted: Orders

## 2020-06-09 DIAGNOSIS — D225 Melanocytic nevi of trunk: Secondary | ICD-10-CM | POA: Diagnosis not present

## 2020-06-09 DIAGNOSIS — Z85828 Personal history of other malignant neoplasm of skin: Secondary | ICD-10-CM | POA: Diagnosis not present

## 2020-06-09 DIAGNOSIS — L570 Actinic keratosis: Secondary | ICD-10-CM | POA: Diagnosis not present

## 2020-06-09 DIAGNOSIS — L821 Other seborrheic keratosis: Secondary | ICD-10-CM | POA: Diagnosis not present

## 2020-06-09 DIAGNOSIS — L853 Xerosis cutis: Secondary | ICD-10-CM | POA: Diagnosis not present

## 2020-08-17 ENCOUNTER — Telehealth: Payer: Self-pay | Admitting: Pharmacist

## 2020-08-17 NOTE — Progress Notes (Addendum)
Chronic Care Management Pharmacy Assistant   Name: [name]  MRN: [id number] DOB: 03/28/1930  Reason for Encounter: Disease State  Patient Questions:  1.  Have you seen any other providers since your last visit? Yes  2.  Any changes in your medicines or health? No  PCP : Colon Branch, MD   Their chronic conditions include: Afib, Pre-Diabetes, Hyperlipidemia/CAD, GERD, Osteoporosis, Allergic Rhinitis, Arthritis   Office Visits: 05-27-20 (PCP)  Patient presented in office with [facility] for annual physical exam.  Provider recommended to check a UA, urine culture.  We will also check a PSA, mostly to see if patient is very high and could indicate prostatitis  (ABX?) Also, consider Flomax RTC 4 months provider reported advise patient:  PSA is mildly increased only, cholesterol and blood sugar are great, urine culture is pending.  For now recommend Flomax 0.4 mg 1 tablet at bedtime to help with the urinary symptoms. Send 33-month supply.  Provider recommended continuing with a low potassium diet . 05-14-20 (PCP) Patient presented in office with [facility] for a ER follow-up.  Patient reported going to the ER 05/09/2020, he was at church and developed lightheadedness on and off, later on developed a left-sided chest discomfort.  Patient stated he called the nurse at the facility he lives at home according to the patient BP was in the 200s.  Went to the ER, initially BP was 174/69, then 161/ 68.  Provider reported work-up done in the hospital including potassium of 3.3, hemoglobin of 11.2 (lower than before) , 2 neg troponins, nonacute chest x-ray and a nonacute EKG.  Patient states since that day, all symptoms are gone.  Provider placed labs for patient per this encounter.  Provider reported Hemoglobin okay, Calcium levels fluctuate, slightly elevated today.  Potassium elevated at 5.7  Provider stated [facility] about results, recommend to avoid with potassium rich foods like OJ, Ensure. Also no potassium  supplements.  Provider recommended BMP labs for next week.  Provider recommended patient check the blood pressure 3 times a week.  BP GOAL is between 110/65 and  135/85.  If it is consistently higher or lower, provider asked to be informed. Consults: 05-21-20 (Internal Medicine) Patient presented [facility] for a BP check. Nurse reported BP was taken on the Left arm, using the small, manual cuff.  Arm was resting on pt's lap.  BP reading was 120/60. 05-17-20 (Internal Medicine) Patient presented [facility] for a BP check.  Nurse reported BP was taken on the Left arm, using the small, manual cuff.  Arm was raised to heart level.  BP reading was 120/60. Hospitalizations: 05-09-20 [facility])  Patient presented in emergency medicine with last attending provider Gerlene Fee for chest pain.  Provider states patient explains that he went to church and began feeling lightheaded.  Later in the day began having chest discomfort on the left side, very mild, present when pushing on the ribs.  Denies diaphoresis, no nausea, no vomiting, no shortness of breath, no leg pain or swelling.  Pain is intermittent, mild, no other exacerbating or alleviating factors.  Blood pressure ranges reported as 174/69 and 161/68.  Provider stated patient's exam/testing was overall reassuring.  Provider reported patient's blood test did not show any heart damage.  Provider suspect that patient pain is related to inflammation or strain of the chest wall muscles or ribs.  Still provider recommend that patient follow-up with patient's cardiologist and discuss your symptoms.  No medication changes noted.   Allergies:   Allergies  Allergen Reactions  . Amoxicillin Diarrhea, Nausea And Vomiting and Other (See Comments)    Patient questions this  . Zoloft [Sertraline Hcl]     Patient didn't like the way it made him feel  . Remeron [Mirtazapine] Other (See Comments)    Caused excessive drowsiness     Medications: Outpatient Encounter Medications as of 08/17/2020  Medication Sig Note  . acetaminophen (TYLENOL) 650 MG CR tablet Take 325-650 mg by mouth every 8 (eight) hours as needed for pain.   [name] aspirin EC 81 MG tablet Take 81 mg by mouth daily.   . calcium carbonate (TUMS - DOSED IN MG ELEMENTAL CALCIUM) 500 MG chewable tablet Chew 1 tablet by mouth daily.   . Cholecalciferol (VITAMIN D-3) 25 MCG (1000 UT) CAPS Take 1,000 Units by mouth daily with breakfast.   . ELIQUIS 5 MG TABS tablet TAKE ONE (1) TABLET BY MOUTH TWO (2) TIMES DAILY (Patient taking differently: Take 5 mg by mouth 2 (two) times daily. )   . folic acid (FOLVITE) 1 MG tablet TAKE ONE (1) TABLET BY MOUTH EACH DAY (Patient taking differently: Take 1 mg by mouth daily. )   . loratadine (CLARITIN) 10 MG tablet Take 0.5-1 tablets (5-10 mg total) by mouth daily as needed for allergies. (Patient taking differently: Take 10 mg by mouth daily as needed for allergies or rhinitis. )   . Multiple Vitamin (MULITIVITAMIN WITH MINERALS) TABS Take 1 tablet by mouth daily.     . nitroGLYCERIN (NITROSTAT) 0.4 MG SL tablet Place 1 tablet (0.4 mg total) under the tongue every 5 (five) minutes x 3 doses as needed for chest pain. (Patient not taking: Reported on 05/14/2020) 05/14/2020: PRN  . pantoprazole (PROTONIX) 40 MG tablet Take 1 tablet (40 mg total) by mouth daily before breakfast. (Patient taking differently: Take 40 mg by mouth daily as needed (for heartburn). )   . rosuvastatin (CRESTOR) 10 MG tablet TAKE 1 TABLET BY MOUTH DAILY (Patient taking differently: Take 10 mg by mouth every evening. )   . tamsulosin (FLOMAX) 0.4 MG CAPS capsule Take 1 capsule (0.4 mg total) by mouth daily after supper.    No facility-administered encounter medications on file as of 08/17/2020.    Current Diagnosis: Patient Active Problem List   Diagnosis Date Noted  . Abdominal pain, epigastric   . Anxiety and depression 06/22/2016  . COPD (chronic  obstructive pulmonary disease) ([place]) 03/25/2016  . Annual physical exam 03/24/2016  . PCP NOTES >>>>>>>>>>>>>> 08/27/2015  . Allergic rhinitis 03/09/2015  . Headache 02/23/2015  . Bradycardia 06/15/2014  . Arthritis, degenerative 06/15/2014  . Hyperglycemia 01/08/2014  . Paroxysmal A-fib ([place]) 12/28/2013  . Diverticulosis of colon without hemorrhage 10/13/2013  . Mixed hyperlipidemia 04/18/2010  . CAD (coronary artery disease) 02/25/2010  . HIP FRACTURE, RIGHT 12/13/2009  . IBS 09/01/2009  . LUMBAR RADICULOPATHY, RIGHT 06/09/2009  . Esophageal reflux 09/22/2008  . PALPITATIONS 05/11/2008  . HYPERPLASIA, PROSTATE NOS W/URINARY OBST/LUTS 09/20/2007  . Closed fracture of thoracic vertebra ([place]) 09/20/2007  . Osteoporosis 01/01/2007    Goals Addressed   None    08/17/2020 Name: [name] MRN: [id number] DOB: 1930/06/28 [name] is a 86 y.o. year old male who is a primary care patient of Colon Branch, MD.  Comprehensive medication review performed; Spoke to patient regarding cholesterol  Lipid Panel    Component Value Date/Time   CHOL 101 05/27/2020 1113  TRIG 107.0 05/27/2020 1113   HDL 59.50 05/27/2020 1113   LDLCALC 20 05/27/2020 1113    10-year ASCVD risk score: The ASCVD Risk score Mikey Bussing DC Jr., et al., 2013) failed to calculate for the following reasons:   The 2013 ASCVD risk score is only valid for ages 68 to 58  . Current antihyperlipidemic regimen:  ? Rosuvastatin 10mg  daily . Previous antihyperlipidemic medications tried: None noted. . What recent interventions/DTPs have been made by any provider to improve Cholesterol control since last CPP Visit: None . Any recent hospitalizations or ED visits since last visit with CPP? Yes Chest pain on 05-09-20 . What diet changes have been made to improve Cholesterol?  o Patients states he eats three meals daily.  States he eats healthy.  Patient states he did have to cut his potassium down due to lab work.  Patient reported  he was eating a lot of soup during the beginning of the pandemic but recently has not been eating any. . What exercise is being done to improve Cholesterol?  o Patient states he does exercises.  States he does walk around neighborhood and house.  States he had a physical therapist come to house to help him practice balancing and do certain bed exercises.  Adherence Review: Does the patient have >5 day gap between last estimated fill dates? No     Follow-Up:  Pharmacist Review   [place] Shannon, [facility] Pharmacist Assistant [phone]  Reviewed by: De Blanch, PharmD Clinical Pharmacist [facility] [phone]

## 2020-08-30 DIAGNOSIS — Z23 Encounter for immunization: Secondary | ICD-10-CM | POA: Diagnosis not present

## 2020-08-31 ENCOUNTER — Other Ambulatory Visit: Payer: Self-pay | Admitting: Internal Medicine

## 2020-08-31 ENCOUNTER — Telehealth: Payer: Self-pay | Admitting: Internal Medicine

## 2020-08-31 NOTE — Telephone Encounter (Signed)
Spoke w/ [name]- he is no longer taking the Flomax as it wasn't doing any good. He reports still having fatigue, we scheduled appt for tomorrow, 09/01/2020.

## 2020-08-31 NOTE — Telephone Encounter (Signed)
Patient states he got this med from the [facility] pick up and he said he didn't order it tamsulosin (FLOMAX) 0.4 MG CAPS. And wanted you to call so yall could discuss. Please Thanks

## 2020-09-01 ENCOUNTER — Ambulatory Visit (INDEPENDENT_AMBULATORY_CARE_PROVIDER_SITE_OTHER): Payer: Medicare Other | Admitting: Internal Medicine

## 2020-09-01 ENCOUNTER — Encounter: Payer: Self-pay | Admitting: Internal Medicine

## 2020-09-01 ENCOUNTER — Ambulatory Visit (HOSPITAL_BASED_OUTPATIENT_CLINIC_OR_DEPARTMENT_OTHER)
Admission: RE | Admit: 2020-09-01 | Discharge: 2020-09-01 | Disposition: A | Discharge status: Home / Self Care | Payer: Medicare Other | Source: Ambulatory Visit | Attending: Internal Medicine | Admitting: Internal Medicine

## 2020-09-01 ENCOUNTER — Other Ambulatory Visit: Payer: Self-pay

## 2020-09-01 VITALS — BP 133/74 | HR 71 | Temp 98.0°F | Resp 18 | Ht 74.0 in | Wt 160.4 lb

## 2020-09-01 DIAGNOSIS — R5383 Other fatigue: Secondary | ICD-10-CM

## 2020-09-01 DIAGNOSIS — J42 Unspecified chronic bronchitis: Secondary | ICD-10-CM | POA: Insufficient documentation

## 2020-09-01 DIAGNOSIS — F32A Depression, unspecified: Secondary | ICD-10-CM

## 2020-09-01 DIAGNOSIS — F419 Anxiety disorder, unspecified: Secondary | ICD-10-CM

## 2020-09-01 DIAGNOSIS — J449 Chronic obstructive pulmonary disease, unspecified: Secondary | ICD-10-CM | POA: Diagnosis not present

## 2020-09-01 DIAGNOSIS — R399 Unspecified symptoms and signs involving the genitourinary system: Secondary | ICD-10-CM

## 2020-09-01 NOTE — Patient Instructions (Addendum)
To schedule pulmonary function tests: please call [facility] at [phone].  GO TO THE FRONT DESK, PLEASE SCHEDULE YOUR APPOINTMENTS Come back for a checkup in 2 months  STOP BY THE FIRST FLOOR:  get the XR

## 2020-09-01 NOTE — Progress Notes (Signed)
Pre visit review using our clinic review tool, if applicable. No additional management support is needed unless otherwise documented below in the visit note. 

## 2020-09-01 NOTE — Progress Notes (Signed)
Subjective:    Patient ID: [name], male    DOB: [birth year], 86 y.o.   MRN: [id number]  DOS:  09/01/2020 Type of visit - description: Follow-up Several issues discussed  His main concern is LUTS, Flomax has not helped. Continue with nocturia x1 but now also reports urinary frequency during the daytime "every 2-3 hours" with some urgency and a slow flow.  No dysuria or gross hematuria  Also reports lack of energy, typically after lunch.  He feels fatigue and could take a nap.  I asked about cough and he report daily cough, early in the morning, approximately for an hour, typically produces some sputum, occasionally light brown.  No hemoptysis. Admits to DOE only if he has to walk fast. Denies wheezing   Review of Systems Denies fever chills No chest pain, lower extremity edema No blood in the stools No anxiety or depression per se although he feels lonely often.   Past Medical History:  Diagnosis Date  . Anxiety   . Atrial fibrillation ([place])   . Blood transfusion without reported diagnosis   . CAD    a. 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  . Cataract   . Chronic abdominal pain   . Chronic nausea   . Coronary artery disease   . Diverticulosis 2011   Diverticulitis 2004  . Dyspepsia   . Esophageal reflux   . GERD (gastroesophageal reflux disease)   . Headache    saw neuro 4-16, MRI done  . HYPERPLASIA, PROSTATE NOS W/URINARY OBST/LUTS   . IBS   . INGUINAL HERNIA   . Lichen planus [id number]   [name]  . LUMBAR RADICULOPATHY, RIGHT   . Mixed hyperlipidemia   . OSTEOPOROSIS   . PONV (postoperative nausea and vomiting)   . PREMATURE VENTRICULAR CONTRACTIONS    a. After CABG - was on Coumadin/Multaq for a period of time. Coumadin discontinued 04/2010 after maintaining NSR.  [name] UNSPECIFIED ANEMIA     Past Surgical History:  Procedure Laterality Date  . BIOPSY  09/10/2018   Procedure:  BIOPSY;  Surgeon: Jerene Bears, MD;  Location: [facility];  Service: Gastroenterology;;  . CATARACT EXTRACTION Right 10/06/2013  . CORONARY ARTERY BYPASS GRAFT     2 VD;anomalous RCA;post op compliacted by AF  . ESOPHAGOGASTRODUODENOSCOPY (EGD) WITH PROPOFOL N/A 09/10/2018   Procedure: ESOPHAGOGASTRODUODENOSCOPY (EGD) WITH PROPOFOL;  Surgeon: Jerene Bears, MD;  Location: [facility];  Service: Gastroenterology;  Laterality: N/A;  . HIP FRACTURE SURGERY Right 03/2007   Dr. Salvadore Farber  . INGUINAL HERNIA REPAIR Left 2012   Dr Brantley Stage  . KNEE SURGERY Right 1977   repair  . PROSTATE BIOPSY  09/19/2013   [facility]    Allergies as of 09/01/2020      Reactions   Amoxicillin Diarrhea, Nausea And Vomiting, Other (See Comments)   Patient questions this   Zoloft [sertraline Hcl]    Patient didn't like the way it made him feel   Remeron [mirtazapine] Other (See Comments)   Caused excessive drowsiness      Medication List       Accurate as of September 01, 2020  9:26 PM. If you have any questions, ask your nurse or doctor.        acetaminophen 650 MG CR tablet Commonly known as: TYLENOL Take 325-650 mg by mouth every 8 (eight) hours as needed for pain.  aspirin EC 81 MG tablet Take 81 mg by mouth daily.   calcium carbonate 500 MG chewable tablet Commonly known as: TUMS - dosed in mg elemental calcium Chew 1 tablet by mouth daily.   denosumab 60 MG/ML Sosy injection Commonly known as: PROLIA Inject 60 mg into the skin every 6 (six) months.   Eliquis 5 MG Tabs tablet Generic drug: apixaban TAKE ONE (1) TABLET BY MOUTH TWO (2) TIMES DAILY What changed: See the new instructions.   famotidine-calcium carbonate-magnesium hydroxide 10-800-165 MG chewable tablet Commonly known as: PEPCID COMPLETE Chew 1 tablet by mouth daily as needed.   folic acid 1 MG tablet Commonly known as: FOLVITE TAKE ONE (1) TABLET BY MOUTH EACH DAY What changed: See the new instructions.    loratadine 10 MG tablet Commonly known as: CLARITIN Take 0.5-1 tablets (5-10 mg total) by mouth daily as needed for allergies. What changed:   how much to take  reasons to take this   multivitamin with minerals Tabs tablet Take 1 tablet by mouth daily.   nitroGLYCERIN 0.4 MG SL tablet Commonly known as: NITROSTAT Place 1 tablet (0.4 mg total) under the tongue every 5 (five) minutes x 3 doses as needed for chest pain.   pantoprazole 40 MG tablet Commonly known as: PROTONIX Take 1 tablet (40 mg total) by mouth daily before breakfast.   rosuvastatin 10 MG tablet Commonly known as: CRESTOR TAKE 1 TABLET BY MOUTH DAILY What changed: when to take this   tamsulosin 0.4 MG Caps capsule Commonly known as: FLOMAX Take 1 capsule (0.4 mg total) by mouth daily after supper.   Vitamin D-3 25 MCG (1000 UT) Caps Take 1,000 Units by mouth daily with breakfast.          Objective:   Physical Exam BP 133/74 (BP Location: Left Arm, Patient Position: Sitting, Cuff Size: Small)   Pulse 71   Temp 98 F (36.7 C) (Oral)   Resp 18   Ht 6\' 2"  (1.88 m)   Wt 160 lb 6 oz (72.7 kg)   SpO2 97%   BMI 20.59 kg/m  General:   Well developed, NAD, BMI noted. HEENT:  Normocephalic . Face symmetric, atraumatic Lungs:  Clear, few dry crackles at bases? Normal respiratory effort, no intercostal retractions, no accessory muscle use. Heart: RRR,  no murmur.  Lower extremities: no pretibial edema bilaterally  Skin: Not pale. Not jaundice Neurologic:  alert & oriented X3.  Speech normal, gait appropriate for age and unassisted Psych--  Cognition and judgment appear intact.  Cooperative with normal attention span and concentration.  Behavior appropriate. No anxious or depressed appearing.      Assessment     Assessment Prediabetes, A1c 6.0 (2015) Hyperlipidemia Anxiety, depression:  -intol to zoloft, Rx lexapro 5 mg 07-2015: intolerant d/t nausea  -lost wife July 2017 CV: ---CAD--CABG  2011 ----P- Atrial fibrillation, PVCs.  Elliquis ; Multaq d/c d/t GI s/e ----Palpable Ao x years, [place] (-) for AAA 2005 COPD (per CXR 06-2015), PFTs 10-04-15 mild obstruction DJD  Gait-- uses a walker prn GI: --GERD, IBS, diverticulosis --Chronic nausea: 2019.  Work-up: 08-2018: Negative EGD, CT  and US abdomen. 12/2018 wnl Gastric empty stomach.  Symptoms decreased after Multaq stopped GU: elevated PSA, BPH , (-) bx 2014 Osteoporosis: -hip FX 2008 - dexa ~2005 --> rx fosamax, took x 1 year, dexa ~2010 was rec no fosamax at that time. Dexa 09-2015: T score -2.4 --> rx fosamax, d/c d/t jaw pain 06-2017; took prolia #25 February 2018; T score (-) 30 December 1789, per Endo Lichen planus Headache -- saw neurology 02-2015, had a MRI/MRA (-)  PLAN LUTS: See last visit, PSA was 5.1 (doubt prostatitis), started Flomax, symptoms not better, he actually has some daytime symptoms.  We agreed on urology referral. Fatigue: He again reports fatigue, typically after his lunch.  Similar to previous symptoms, see visit from 01/20/2019. He denies chest pain, normal stress test 05/21/2020.Recent labs: No anemia, A1c less than 6 on no medicines. Recommend observation. COPD: Evidence of COPD on chest x-ray 2016, PFT study time with mild obstruction. Today he reports chronic daily cough in the morning with some sputum production. He used to smoke when he was younger. O2 sat upon arrival 88%, I recheck: 97% at rest and subsequently 95% after exertion (he was able to walk quickly without any problems, no cough or wheezing). Plan: Recheck chest x-ray, PFTs, consider an inhaler. S/p moderna c-19 shot, rec to  wait for booster approval by the CDC. Anxiety depression: So far intolerant to SSRIs, not clinically depressed but understandably feels lonely sometimes. RTC 2 months  This visit occurred during the SARS-CoV-2 public health emergency.  Safety protocols were in place, including screening questions prior to the visit,  additional usage of staff PPE, and extensive cleaning of exam room while observing appropriate contact time as indicated for disinfecting solutions.

## 2020-09-01 NOTE — Assessment & Plan Note (Signed)
LUTS: See last visit, PSA was 5.1 (doubt prostatitis), started Flomax, symptoms not better, he actually has some daytime symptoms.  We agreed on urology referral. Fatigue: He again reports fatigue, typically after his lunch.  Similar to previous symptoms, see visit from 01/20/2019. He denies chest pain, normal stress test 05/21/2020.Recent labs: No anemia, A1c less than 6 on no medicines. Recommend observation. COPD: Evidence of COPD on chest x-ray 2016, PFT study time with mild obstruction. Today he reports chronic daily cough in the morning with some sputum production. He used to smoke when he was younger. O2 sat upon arrival 88%, I recheck: 97% at rest and subsequently 95% after exertion (he was able to walk quickly without any problems, no cough or wheezing). Plan: Recheck chest x-ray, PFTs, consider an inhaler. S/p moderna c-19 shot, rec to  wait for booster approval by the CDC. Anxiety depression: So far intolerant to SSRIs, not clinically depressed but understandably feels lonely sometimes. RTC 2 months

## 2020-09-06 ENCOUNTER — Other Ambulatory Visit: Payer: Self-pay | Admitting: Cardiovascular Disease

## 2020-09-06 ENCOUNTER — Other Ambulatory Visit: Payer: Self-pay | Admitting: Internal Medicine

## 2020-09-06 DIAGNOSIS — I480 Paroxysmal atrial fibrillation: Secondary | ICD-10-CM

## 2020-09-06 NOTE — Telephone Encounter (Signed)
Prescription refill request for Eliquis received. Indication: a fib Last office visit: 03/24/20 Scr: 0.75 Age: 86 Weight: 72Kg

## 2020-09-07 ENCOUNTER — Ambulatory Visit (INDEPENDENT_AMBULATORY_CARE_PROVIDER_SITE_OTHER): Payer: Medicare Other | Admitting: Internal Medicine

## 2020-09-07 ENCOUNTER — Other Ambulatory Visit: Payer: Self-pay

## 2020-09-07 DIAGNOSIS — J42 Unspecified chronic bronchitis: Secondary | ICD-10-CM

## 2020-09-07 LAB — PULMONARY FUNCTION TEST
DL/VA % pred: 101 %
DL/VA: 3.79 ml/min/mmHg/L
DLCO cor % pred: 74 %
DLCO cor: 19.72 ml/min/mmHg
DLCO unc % pred: 74 %
DLCO unc: 19.72 ml/min/mmHg
FEF 25-75 Post: 1.66 L/sec
FEF 25-75 Pre: 1.58 L/sec
FEF2575-%Change-Post: 5 %
FEF2575-%Pred-Post: 82 %
FEF2575-%Pred-Pre: 78 %
FEV1-%Change-Post: 0 %
FEV1-%Pred-Post: 76 %
FEV1-%Pred-Pre: 75 %
FEV1-Post: 2.37 L
FEV1-Pre: 2.35 L
FEV1FVC-%Change-Post: -2 %
FEV1FVC-%Pred-Pre: 101 %
FEV6-%Change-Post: 3 %
FEV6-%Pred-Post: 82 %
FEV6-%Pred-Pre: 79 %
FEV6-Post: 3.38 L
FEV6-Pre: 3.28 L
FEV6FVC-%Change-Post: 0 %
FEV6FVC-%Pred-Post: 106 %
FEV6FVC-%Pred-Pre: 107 %
FVC-%Change-Post: 3 %
FVC-%Pred-Post: 77 %
FVC-%Pred-Pre: 74 %
FVC-Post: 3.4 L
FVC-Pre: 3.3 L
Post FEV1/FVC ratio: 70 %
Post FEV6/FVC ratio: 99 %
Pre FEV1/FVC ratio: 71 %
Pre FEV6/FVC Ratio: 100 %
RV % pred: 102 %
RV: 3.11 L
TLC % pred: 81 %
TLC: 6.45 L

## 2020-09-07 NOTE — Progress Notes (Signed)
Full PFT performed today. °

## 2020-09-27 ENCOUNTER — Telehealth: Payer: Self-pay | Admitting: Internal Medicine

## 2020-09-27 NOTE — Telephone Encounter (Signed)
Routed to Linus Galas, CMA to make aware.

## 2020-09-27 NOTE — Telephone Encounter (Signed)
Patient called to ask about scheduling his next Prolia shot.  His last was 03/30/2020.  Please contact patient at [phone]

## 2020-09-29 ENCOUNTER — Ambulatory Visit: Payer: Medicare Other | Admitting: Internal Medicine

## 2020-10-01 NOTE — Telephone Encounter (Signed)
Spoke with the pt ans made him aware that his insurance has denied Prolia-asked Prolia rep for advice on how to proceed

## 2020-10-30 ENCOUNTER — Telehealth: Payer: Self-pay

## 2020-10-30 NOTE — Telephone Encounter (Signed)
Left patient a VM requesting he call back to schedule a nurse visit to get Prolia-he owes $280 at check-in and can be scheduled anytime

## 2020-11-03 ENCOUNTER — Ambulatory Visit: Payer: Medicare Other | Admitting: Pharmacist

## 2020-11-03 DIAGNOSIS — R739 Hyperglycemia, unspecified: Secondary | ICD-10-CM

## 2020-11-03 DIAGNOSIS — E785 Hyperlipidemia, unspecified: Secondary | ICD-10-CM

## 2020-11-03 NOTE — Chronic Care Management (AMB) (Signed)
Chronic Care Management Pharmacy  Name: [name]  MRN: [id number] DOB: [birth year]   Chief Complaint/ HPI  [name],  86 y.o. , male presents for their Follow-Up CCM visit with the clinical pharmacist via telephone due to COVID-19 Pandemic.  PCP : Colon Branch, MD  Their chronic conditions include: Afib, Pre-Diabetes, Hyperlipidemia/CAD, GERD, Osteoporosis, Allergic Rhinitis, Arthritis   Office Visits: 09/01/20: Visit w/ Dr. Larose Kells - Flomax did not improve symptoms. Urology referral. Observation for fatigue. Recheck inhaler, PFTs, consider inhaler.   05/27/20: Visit w/ Dr. Larose Kells - Start tamsulosin for urinary symptoms.  Consult Visit: 10/30/20: Message from endo that patient can be scheduled for Prolia and cost is $280.  09/07/20: Pulmonary visit w/ Dr. Annamaria Boots - Full PFTs performed.   08/30/20: Flu vaccine given at [facility]    Medications: Outpatient Encounter Medications as of 11/03/2020  Medication Sig Note  . acetaminophen (TYLENOL) 650 MG CR tablet Take 325-650 mg by mouth every 8 (eight) hours as needed for pain.   [name] aspirin EC 81 MG tablet Take 81 mg by mouth daily.   . calcium carbonate (TUMS - DOSED IN MG ELEMENTAL CALCIUM) 500 MG chewable tablet Chew 1 tablet by mouth daily.   . Cholecalciferol (VITAMIN D-3) 25 MCG (1000 UT) CAPS Take 1,000 Units by mouth daily with breakfast.   . denosumab (PROLIA) 60 MG/ML SOSY injection Inject 60 mg into the skin every 6 (six) months. 09/01/2020: Dr. Cruzita Lederer  . famotidine-calcium carbonate-magnesium hydroxide (PEPCID COMPLETE) 10-800-165 MG chewable tablet Chew 1 tablet by mouth daily as needed.   . folic acid (FOLVITE) 1 MG tablet TAKE ONE (1) TABLET BY MOUTH EACH [name] (Patient taking differently: Take 1 mg by mouth daily. )   . loratadine (CLARITIN) 10 MG tablet Take 1 tablet (10 mg total) by mouth daily.   . Multiple Vitamin (MULITIVITAMIN WITH MINERALS) TABS Take 1 tablet by mouth daily.     . rosuvastatin (CRESTOR) 10 MG tablet  TAKE 1 TABLET BY MOUTH DAILY (Patient taking differently: Take 10 mg by mouth every evening. )   . tamsulosin (FLOMAX) 0.4 MG CAPS capsule Take 1 capsule (0.4 mg total) by mouth daily after supper.   [name] ELIQUIS 5 MG TABS tablet TAKE ONE (1) TABLET BY MOUTH TWO (2) TIMES DAILY   . nitroGLYCERIN (NITROSTAT) 0.4 MG SL tablet Place 1 tablet (0.4 mg total) under the tongue every 5 (five) minutes x 3 doses as needed for chest pain. (Patient not taking: Reported on 05/14/2020) 05/14/2020: PRN  . pantoprazole (PROTONIX) 40 MG tablet Take 1 tablet (40 mg total) by mouth daily before breakfast. (Patient not taking: Reported on 09/01/2020) 11/03/2020: Using famotidine instead, but wants to keep on his list just in case it's needed   No facility-administered encounter medications on file as of 11/03/2020.   SDOH Screenings   Alcohol Screen:   . Last Alcohol Screening Score (AUDIT): Not on file  Depression (PHQ2-9): Low Risk   . PHQ-2 Score: 0  Financial Resource Strain: Low Risk   . Difficulty of Paying Living Expenses: Not very hard  Food Insecurity:   . Worried About Charity fundraiser in the Last Year: Not on file  . Ran Out of Food in the Last Year: Not on file  Housing:   . Last Housing Risk Score: Not on file  Physical Activity:   . Days of Exercise per Week: Not on file  . Minutes of Exercise per Session: Not on  file  Social Connections:   . Frequency of Communication with Friends and Family: Not on file  . Frequency of Social Gatherings with Friends and Family: Not on file  . Attends Religious Services: Not on file  . Active Member of Clubs or Organizations: Not on file  . Attends Archivist Meetings: Not on file  . Marital Status: Not on file  Stress:   . Feeling of Stress : Not on file  Tobacco Use: Medium Risk  . Smoking Tobacco Use: Former Smoker  . Smokeless Tobacco Use: Never Used  Transportation Needs:   . Film/video editor (Medical): Not on file  . Lack of  Transportation (Non-Medical): Not on file     Current Diagnosis/Assessment:  Goals Addressed            This Visit's Progress   . Chronic Care Management Pharmacy Care Plan       CARE PLAN ENTRY (see longitudinal plan of care for additional care plan information)  Current Barriers:  . Chronic Disease Management support, education, and care coordination needs related to Afib, Pre-Diabetes, Hyperlipidemia/CAD, GERD, Osteoporosis, Allergic Rhinitis, Arthritis    Hyperlipidemia Lab Results  Component Value Date/Time   LDLCALC 20 05/27/2020 11:13 AM   . Pharmacist Clinical Goal(s): o Over the next 180 days, patient will work with PharmD and providers to maintain LDL goal < 70 . Current regimen:  o Rosuvastatin 10mg  daily . Patient self care activities - Over the next 180 days, patient will: o Maintain cholesterol medication regimen.   Pre-Diabetes Lab Results  Component Value Date/Time   HGBA1C 5.8 05/27/2020 11:13 AM   HGBA1C 6.1 01/20/2019 02:49 PM   . Pharmacist Clinical Goal(s): o Over the next 180 days, patient will work with PharmD and providers to maintain A1c goal <6.5% . Current regimen:  o Diet and exercise management    . Interventions: o Discussed diet extensively . Patient self care activities - Over the next 180 days, patient will: o Maintain a1c <6.5%  Health Maintenance  . Pharmacist Clinical Goal(s) o Over the next 180 days, patient will work with PharmD and providers to complete recommended vaccine series . Interventions: o Recommended patient to receive Shingrix vaccine series . Patient self care activities - Over the next 180 days, patient will: o Complete Shingrix vaccine series  Medication management . Pharmacist Clinical Goal(s): o Over the next 180 days, patient will work with PharmD and providers to maintain optimal medication adherence . Current pharmacy: [facility] . Interventions o Comprehensive medication review  performed. o Continue current medication management strategy o Collaboration with provider regarding medication management (folic acid indication) . Patient self care activities - Over the next 180 days, patient will: o Focus on medication adherence by filling and taking medications appropriately  o Take medications as prescribed o Report any questions or concerns to PharmD and/or provider(s)  Please see past updates related to this goal by clicking on the "Past Updates" button in the selected goal        Social Hx:  Lives at [facility]. It is a continuing care facility. Has been there for 9 years. He originally came with his wife who had Alzheimer's. Wife passed in July 2017. No children. Minimal family.  [facility] delivers the medications to the clinic. The clinic gives him a call and then he picks up the medications. He likes to receive his medications in the bottles. He feels like he's old fashioned. He just takes meds  from the bottles. He does not use a pill box. He marks what the medications are on the top of the lid.  He reads medical pamphlets that he finds around [facility].   Pre-Diabetes   A1c goal <6.5% (pt's goal is to have it lower than 6%)  Recent Relevant Labs: Lab Results  Component Value Date/Time   HGBA1C 5.8 05/27/2020 11:13 AM   HGBA1C 6.1 01/20/2019 02:49 PM    Patient has failed these meds in past: None noted  Patient is currently controlled on the following medications:   None  Does not drink dairy. Drinks almond milk. Used to drink soy milk, but switched to almond milk about 5-6 years ago  [place] cooks meals for him. B- alternates between cereal w/ dried cranberries with almond milk and oatmeal w/ raisens; occasionally will splurge on an egg L - Salads, tenderloin, grilled cheese with pepperjack and tomatoe; biggest meal of the [place] D - Soups; lighter foods Snacks - slivered almonds, cashews, or walnuts (with every  meal) Eats oranges, apples, grapes, cantelope, blue berries, strawberries, watermelon. Gave up on bananas d/t potassium.  Gave up sweets. Eats whole wheat bread. Tries to avoid refined flour. Tries to   Exercise Enjoys walking outside to enjoy the scenery. Does not like walking on treadmill.  There are 3 ponds at [facility]. Walking around it 4 times equals a mile.  The ponds have fish and turtles and he will sometime feed the fish and turtles with bread.  We discussed: diet and exercise extensively   Update 11/03/20 A1c at goal and trending down. Congratulated patient on this. He achieved his goal of a1c <6%! States he gave up desserts and any foods with lots of sugar.  Plan -Continue control with diet and exercise   Hyperlipidemia/CAD   LDL goal <70  Lipid Panel     Component Value Date/Time   CHOL 101 05/27/2020 1113   TRIG 107.0 05/27/2020 1113   HDL 59.50 05/27/2020 1113   LDLCALC 20 05/27/2020 1113    The ASCVD Risk score (Goff DC Jr., et al., 2013) failed to calculate for the following reasons:   The 2013 ASCVD risk score is only valid for ages 35 to 71   Patient has failed these meds in past: None noted  Patient is currently controlled on the following medications:   Rosuvastatin 10mg  daily  CABG 2011 States he has been taking rosuvastatin since his operation. Does not salt any food that he eats  Update 11/03/20 LDL remains at goal  Plan -Continue current medications   BPH   PSA  Date Value Ref Range Status  05/27/2020 5.18 (H) 0.10 - 4.00 ng/mL Final    Comment:    Test performed using Access Hybritech PSA Assay, a parmagnetic partical, chemiluminecent immunoassay.  03/27/2018 3.74 0.10 - 4.00 ng/mL Final    Comment:    Test performed using Access Hybritech PSA Assay, a parmagnetic partical, chemiluminecent immunoassay.  08/08/2013 3.15 0.10 - 4.00 ng/mL Final     Patient has failed these meds in past: None noted  Patient is currently controlled  on the following medications:  . Tamsulosin 0.4mg  daily  Admits he stopped taking it, but restarted in November.  Overnight urination: 1 time (around 2-3 AM) Bladder Emptying: When he goes it feels like not a lot is coming out, but relieves him enough to go back to sleep Stream vs Dribbling: small stream at night. During Jeromie Gainor his stream feels normal.  No  one contacted him from urology referral. Note that urology referral has been placed and is ready for scheduling.  Plan -Continue current medications  -Discuss with Dr. Larose Kells if he should continue with urology referral  GERD   Patient has failed these meds in past: pantoprazole Patient is currently controlled on the following medications:  . Famotidine 10mg  as needed  Breakthrough Sx: Occasionally (once every 2-3 weeks) Breakthrough Tx: Famotidine Triggers: Unsure  Likes pepcid more than protonix. Only takes as needed. Felt nauseated when taking pantoprazole.  Plan -Continue current medications   Osteoporosis   Last DEXA Scan: 01/22/19   T-Score femoral neck: Left -3.0 (down 8%)  T-Score lumbar spine: -2.2 (up 1.4%)  VITD  Date Value Ref Range Status  12/09/2019 46.37 30.00 - 100.00 ng/mL Final     Patient is a candidate for pharmacologic treatment due to T-Score < -2.5 in femoral neck  Patient has failed these meds in past: Fosamax (jaw pain) Patient is currently controlled on the following medications:   Prolia 60mg  every 6 months per Dr. Renne Crigler  Calcium carbonate 500mg  daily  Vitamin D 1000 units daily  We discussed:  Recommend weight-bearing and muscle strengthening exercises for building and maintaining bone density.   Update 11/03/20 Going to get Prolia injection tomorrow after [employer].  Plan -Continue current medications   Hyperkalemia    Patient has failed these meds in past: None noted  Patient is currently controlled on the following medications: . None  Saw a nutritionist and was told that his  hyperkalemia could have been due to his [place]. He states he is no longer eating a large portion of soup.  Plan -Continue current management  Vaccines   Reviewed and discussed patient's vaccination history.    Immunization History  Administered Date(s) Administered  . Influenza Whole 09/05/2007, 08/13/2009, 08/27/2012  . Influenza, High Dose Seasonal PF 08/20/2018, 10/02/2019, 08/30/2020  . Influenza,inj,Quad PF,6+ Mos 08/25/2016  . Influenza-Unspecified 08/26/2013, 08/28/2015, 08/28/2017  . Moderna SARS-COVID-2 Vaccination 12/10/2019, 02/05/2020, 09/29/2020  . PPD Test 05/16/2011  . Pneumococcal Conjugate-13 05/17/2015  . Pneumococcal Polysaccharide-23 05/18/2011  . Tdap 05/16/2011  . Zoster Recombinat (Shingrix) 05/20/2020, 08/26/2020   Update 11/03/20 Patient received both Shingrix vaccines. (05/20/20 and 08/26/20) Patient received Covid booster on 09/29/20 Patient is up to date on vaccines.    Miscellaneous Meds  Folic Acid (pt wonders if he still needs to take this - cardiologist recommended that he continue)  De Blanch, PharmD, [facility] Clinical Pharmacist [facility] [phone]

## 2020-11-03 NOTE — Patient Instructions (Addendum)
Visit Information  Goals Addressed            This Visit's Progress   . Chronic Care Management Pharmacy Care Plan       CARE PLAN ENTRY (see longitudinal plan of care for additional care plan information)  Current Barriers:  . Chronic Disease Management support, education, and care coordination needs related to Afib, Pre-Diabetes, Hyperlipidemia/CAD, GERD, Osteoporosis, Allergic Rhinitis, Arthritis    Hyperlipidemia Lab Results  Component Value Date/Time   LDLCALC 20 05/27/2020 11:13 AM   . Pharmacist Clinical Goal(s): o Over the next 180 days, patient will work with PharmD and providers to maintain LDL goal < 70 . Current regimen:  o Rosuvastatin 10mg  daily . Patient self care activities - Over the next 180 days, patient will: o Maintain cholesterol medication regimen.   Pre-Diabetes Lab Results  Component Value Date/Time   HGBA1C 5.8 05/27/2020 11:13 AM   HGBA1C 6.1 01/20/2019 02:49 PM   . Pharmacist Clinical Goal(s): o Over the next 180 days, patient will work with PharmD and providers to maintain A1c goal <6.5% . Current regimen:  o Diet and exercise management    . Interventions: o Discussed diet extensively . Patient self care activities - Over the next 180 days, patient will: o Maintain a1c <6.5%  Health Maintenance  . Pharmacist Clinical Goal(s) o Over the next 180 days, patient will work with PharmD and providers to complete recommended vaccine series . Interventions: o Recommended patient to receive Shingrix vaccine series . Patient self care activities - Over the next 180 days, patient will: o Complete Shingrix vaccine series  Medication management . Pharmacist Clinical Goal(s): o Over the next 180 days, patient will work with PharmD and providers to maintain optimal medication adherence . Current pharmacy: [facility] . Interventions o Comprehensive medication review performed. o Continue current medication management strategy o Collaboration  with provider regarding medication management (folic acid indication) . Patient self care activities - Over the next 180 days, patient will: o Focus on medication adherence by filling and taking medications appropriately  o Take medications as prescribed o Report any questions or concerns to PharmD and/or provider(s)  Please see past updates related to this goal by clicking on the "Past Updates" button in the selected goal         The patient verbalized understanding of instructions, educational materials, and care plan provided today and agreed to receive a mailed copy of patient instructions, educational materials, and care plan.   Telephone follow up appointment with pharmacy team member scheduled for: 05/04/2020  Melvenia Beam Hosey [place]mester, [facility]

## 2020-11-04 ENCOUNTER — Ambulatory Visit: Payer: Medicare Other

## 2020-11-04 ENCOUNTER — Encounter: Payer: Self-pay | Admitting: Internal Medicine

## 2020-11-04 ENCOUNTER — Other Ambulatory Visit: Payer: Self-pay | Admitting: Cardiovascular Disease

## 2020-11-04 ENCOUNTER — Other Ambulatory Visit: Payer: Self-pay

## 2020-11-04 ENCOUNTER — Ambulatory Visit (INDEPENDENT_AMBULATORY_CARE_PROVIDER_SITE_OTHER): Payer: Medicare Other | Admitting: Internal Medicine

## 2020-11-04 VITALS — BP 152/74 | HR 65 | Temp 97.6°F | Ht 74.0 in | Wt 160.4 lb

## 2020-11-04 DIAGNOSIS — K219 Gastro-esophageal reflux disease without esophagitis: Secondary | ICD-10-CM

## 2020-11-04 DIAGNOSIS — R5383 Other fatigue: Secondary | ICD-10-CM

## 2020-11-04 DIAGNOSIS — J42 Unspecified chronic bronchitis: Secondary | ICD-10-CM | POA: Diagnosis not present

## 2020-11-04 DIAGNOSIS — R399 Unspecified symptoms and signs involving the genitourinary system: Secondary | ICD-10-CM | POA: Diagnosis not present

## 2020-11-04 DIAGNOSIS — M810 Age-related osteoporosis without current pathological fracture: Secondary | ICD-10-CM

## 2020-11-04 MED ORDER — DENOSUMAB 60 MG/ML ~~LOC~~ SOSY
60.0000 mg | PREFILLED_SYRINGE | Freq: Once | SUBCUTANEOUS | Status: AC
  Administered 2020-11-04: 60 mg via SUBCUTANEOUS

## 2020-11-04 NOTE — Progress Notes (Signed)
Prolia injection administered. Patient tolerated well.  

## 2020-11-04 NOTE — Patient Instructions (Signed)
Check the  blood pressure monthly BP GOAL is between 110/65 and  135/85. If it is consistently higher or lower, let me know    GO TO THE FRONT DESK, Orient back for    a checkup in 6 months

## 2020-11-04 NOTE — Progress Notes (Signed)
Subjective:    Patient ID: [name], male    DOB: 06-23-1930, 86 y.o.   MRN: [id number]  DOS:  11/04/2020 Type of visit - description: Follow-up  Since the last office visit he is doing well. LUTS unchanged. Lack of energy: Good days and bad days, he remains active, take the stairs daily instead of the elevator without problems.   Review of Systems See above   Past Medical History:  Diagnosis Date  . Anxiety   . Atrial fibrillation ([place])   . Blood transfusion without reported diagnosis   . CAD    a. 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  . Cataract   . Chronic abdominal pain   . Chronic nausea   . Coronary artery disease   . Diverticulosis 2011   Diverticulitis 2004  . Dyspepsia   . Esophageal reflux   . GERD (gastroesophageal reflux disease)   . Headache    saw neuro 4-16, MRI done  . HYPERPLASIA, PROSTATE NOS W/URINARY OBST/LUTS   . IBS   . INGUINAL HERNIA   . Lichen planus [id number]   [name]  . LUMBAR RADICULOPATHY, RIGHT   . Mixed hyperlipidemia   . OSTEOPOROSIS   . PONV (postoperative nausea and vomiting)   . PREMATURE VENTRICULAR CONTRACTIONS    a. After CABG - was on Coumadin/Multaq for a period of time. Coumadin discontinued 04/2010 after maintaining NSR.  [name] UNSPECIFIED ANEMIA     Past Surgical History:  Procedure Laterality Date  . BIOPSY  09/10/2018   Procedure: BIOPSY;  Surgeon: Jerene Bears, MD;  Location: [facility];  Service: Gastroenterology;;  . CATARACT EXTRACTION Right 10/06/2013  . CORONARY ARTERY BYPASS GRAFT     2 VD;anomalous RCA;post op compliacted by AF  . ESOPHAGOGASTRODUODENOSCOPY (EGD) WITH PROPOFOL N/A 09/10/2018   Procedure: ESOPHAGOGASTRODUODENOSCOPY (EGD) WITH PROPOFOL;  Surgeon: Jerene Bears, MD;  Location: [facility];  Service: Gastroenterology;  Laterality: N/A;  . HIP FRACTURE SURGERY Right 03/2007   Dr. Salvadore Farber  . INGUINAL HERNIA  REPAIR Left 2012   Dr Brantley Stage  . KNEE SURGERY Right 1977   repair  . PROSTATE BIOPSY  09/19/2013   [facility]    Allergies as of 11/04/2020      Reactions   Amoxicillin Diarrhea, Nausea And Vomiting, Other (See Comments)   Patient questions this   Zoloft [sertraline Hcl]    Patient didn't like the way it made him feel   Remeron [mirtazapine] Other (See Comments)   Caused excessive drowsiness      Medication List       Accurate as of November 04, 2020 11:59 PM. If you have any questions, ask your nurse or doctor.        acetaminophen 650 MG CR tablet Commonly known as: TYLENOL Take 325-650 mg by mouth every 8 (eight) hours as needed for pain.   aspirin EC 81 MG tablet Take 81 mg by mouth daily.   calcium carbonate 500 MG chewable tablet Commonly known as: TUMS - dosed in mg elemental calcium Chew 1 tablet by mouth daily.   denosumab 60 MG/ML Sosy injection Commonly known as: PROLIA Inject 60 mg into the skin every 6 (six) months.   Eliquis 5 MG Tabs tablet Generic drug: apixaban TAKE ONE (1) TABLET BY MOUTH TWO (2) TIMES DAILY   famotidine-calcium carbonate-magnesium hydroxide 10-800-165 MG chewable tablet Commonly known as: PEPCID COMPLETE  Chew 1 tablet by mouth daily as needed.   folic acid 1 MG tablet Commonly known as: FOLVITE Take 1 tablet (1 mg total) by mouth daily. What changed: See the new instructions. Changed by: Jenkins Rouge, MD   loratadine 10 MG tablet Commonly known as: CLARITIN Take 1 tablet (10 mg total) by mouth daily.   multivitamin with minerals Tabs tablet Take 1 tablet by mouth daily.   nitroGLYCERIN 0.4 MG SL tablet Commonly known as: NITROSTAT Place 1 tablet (0.4 mg total) under the tongue every 5 (five) minutes x 3 doses as needed for chest pain.   pantoprazole 40 MG tablet Commonly known as: PROTONIX Take 1 tablet (40 mg total) by mouth daily before breakfast.   rosuvastatin 10 MG tablet Commonly known as:  CRESTOR TAKE 1 TABLET BY MOUTH DAILY What changed: when to take this   tamsulosin 0.4 MG Caps capsule Commonly known as: FLOMAX Take 1 capsule (0.4 mg total) by mouth daily after supper.   Vitamin D-3 25 MCG (1000 UT) Caps Take 1,000 Units by mouth daily with breakfast.          Objective:   Physical Exam BP (!) 152/74 (BP Location: Right Arm, Patient Position: Sitting, Cuff Size: Large)   Pulse 65   Temp 97.6 F (36.4 C) (Oral)   Ht 6\' 2"  (1.88 m)   Wt 160 lb 6.4 oz (72.8 kg)   SpO2 99%   BMI 20.59 kg/m  General:   Well developed, NAD, BMI noted. HEENT:  Normocephalic . Face symmetric, atraumatic Lungs:  decreased breath sounds Normal respiratory effort, no intercostal retractions, no accessory muscle use. Heart: Seems regular, trace systolic murmur?. Lower extremities: no pretibial edema bilaterally  Skin: Not pale. Not jaundice Neurologic:  alert & oriented X3.  Speech normal, gait appropriate for age and unassisted Psych--  Cognition and judgment appear intact.  Cooperative with normal attention span and concentration.  Behavior appropriate. No anxious or depressed appearing.      Assessment       Assessment Prediabetes, A1c 6.0 (2015) Hyperlipidemia Anxiety, depression:  -intol to zoloft, Rx lexapro 5 mg 07-2015: intolerant d/t nausea  -lost wife July 2017 CV: ---CAD--CABG 2011 ----P- Atrial fibrillation, PVCs.  Elliquis ; Multaq d/c d/t GI s/e ----Palpable Ao x years, [place] (-) for AAA 2005 COPD (per CXR 06-2015), PFTs 10-04-15 mild obstruction DJD  Gait-- uses a walker prn GI: --GERD, IBS, diverticulosis --Chronic nausea: 2019.  Work-up: 08-2018: Negative EGD, CT  and US abdomen. 12/2018 wnl Gastric empty stomach.  Symptoms decreased after Multaq stopped GU: elevated PSA, BPH , (-) bx 2014 Osteoporosis: -hip FX 2008 - dexa ~2005 --> rx fosamax, took x 1 year, dexa ~2010 was rec no fosamax at that time. Dexa 09-2015: T score -2.4 --> rx fosamax, d/c  d/t jaw pain 06-2017; took prolia #25 February 2018; T score (-) 30 December 7856, per Endo Lichen planus Headache -- saw neurology 02-2015, had a MRI/MRA (-) COPD: PFTs 08-2020: Mild obstruction with some air trapping.  Insignificant response to bronchodilators.   PLAN LUTS: Since the OV, did not get to see urology, "nobody called".  Sxs ~ the same.  I offered to re-refer to urology but he declined.  Symptoms are not bothersome.  He likes to get off Flomax, I recommend a trial without it for 2 weeks, go back on it if he feels it was helping. COPD: Had PFTs 08-2020: Mild obstruction with some air trapping.  Insignificant response to bronchodilators.  Was rec to try inhalers but at this point he is doing great, no SOB- cough or mucus production.  Not taking any inhalers. Fatigue: Has good days and bad days, not a major issue for him at this point. Slightly elevated potassium: Currently doing a low potassium diet, last K+ normal GERD: On famotidine, Protonix as needed only. Trace systolic murmur?  Reassess on RTC Preventive care: COVID shot x3. Had a flu shot next RTC 6 months     This visit occurred during the SARS-CoV-2 public health emergency.  Safety protocols were in place, including screening questions prior to the visit, additional usage of staff PPE, and extensive cleaning of exam room while observing appropriate contact time as indicated for disinfecting solutions.

## 2020-11-05 NOTE — Assessment & Plan Note (Signed)
LUTS: Since the OV, did not get to see urology, "nobody called".  Sxs ~ the same.  I offered to re-refer to urology but he declined.  Symptoms are not bothersome.  He likes to get off Flomax, I recommend a trial without it for 2 weeks, go back on it if he feels it was helping. COPD: Had PFTs 08-2020: Mild obstruction with some air trapping.  Insignificant response to bronchodilators. Was rec to try inhalers but at this point he is doing great, no SOB- cough or mucus production.  Not taking any inhalers. Fatigue: Has good days and bad days, not a major issue for him at this point. Slightly elevated potassium: Currently doing a low potassium diet, last K+ normal GERD: On famotidine, Protonix as needed only. Trace systolic murmur?  Reassess on RTC Preventive care: COVID shot x3. Had a flu shot next RTC 6 months

## 2020-11-30 NOTE — Telephone Encounter (Signed)
I have resubmitted this pt to the [employer] portal for insurance verificatin

## 2020-12-09 ENCOUNTER — Other Ambulatory Visit: Payer: Self-pay

## 2020-12-09 ENCOUNTER — Encounter: Payer: Self-pay | Admitting: Internal Medicine

## 2020-12-09 ENCOUNTER — Ambulatory Visit: Payer: Medicare Other | Admitting: Internal Medicine

## 2020-12-09 VITALS — BP 110/72 | HR 62 | Ht 74.0 in | Wt 163.2 lb

## 2020-12-09 DIAGNOSIS — M810 Age-related osteoporosis without current pathological fracture: Secondary | ICD-10-CM | POA: Diagnosis not present

## 2020-12-09 NOTE — Progress Notes (Signed)
Patient ID: [name], male   DOB: Jan 23, 1930, 87 y.o.   MRN: [id number]   This visit occurred during the SARS-CoV-2 public health emergency.  Safety protocols were in place, including screening questions prior to the visit, additional usage of staff PPE, and extensive cleaning of exam room while observing appropriate contact time as indicated for disinfecting solutions.   HPI  [name] is a 87 y.o.-year-old pleasan male, initially referred by his PCP, Dr. Larose Kells, returning for follow-up osteoporosis.  Last visit 1 year ago.  He was diagnosed with osteoporosis in 2008  I first saw the patient in 87 and at that time I suggested either Tymlos/Forteo or Romosozumab Field seismologist).  He did not decide for these medications but at last visit, we decided to restart Prolia. He feels better at this visit, with less joint pain and more energy.  Reviewed previous DXA scan reports: 01/22/2019 ([place]) Lumbar spine L1-L4 Femoral neck (FN) 33% distal radius  T-score -2.2 LFN: -3.0 n/a  Change in BMD from previous DXA test (%) +1.4% -8.0%* n/a  (*) statistically significant  Date L1-L4 T score FN T score 33% distal Radius  10/14/2015 ([place]) -2.3 LFN: -2.6   10/04/2007 -2.5 RFN: -2.8 LFN: -2.3    Reviewed previous fractures: - vertebral compression fractures per review of the abd. CT report from 08/16/2018: moderate T8, mild T9, T10, moderate T12 compression fractures. He feels they developed after lifting heavy objects. - Right hip fracture 03/2008: "There is a spiral fracture of the proximal aspect of the right femur.  This begins in the intertrochanteric region and extends into the proximal femur.  There is a avulsion of the lesser trochanter.  The femoral head is located within the acetabulum. Degenerative changes are noted in the lower spine.  Bones appear Osteopenic".  Osteopenia was detected on x ray of sacrum/coccyx 01/14/2016.  He denies dizziness, vertigo, orthostasis, or  vision.  Reviewed previous osteoporosis treatments and side effects: Fosamax - 2007-2009, then restarted in 2016-11/2017  - stopped as he developed jaw degeneration Prolia 12/2017 - 08/2018 - stopped as he developed jaw degeneration Prolia restarted  -tolerated well 02/2020, 09/2020  No history of vitamin D deficiency: Lab Results  Component Value Date   VD25OH 46.37 12/09/2019   VD25OH 50.45 12/09/2018   VD25OH 48 12/18/2011   VD25OH 52 12/14/2009   VD25OH 35 06/09/2009   He is on: - calcium (Tums) 2x a day - vit D 1000 units daily - Multivitamin  He does weightbearing exercises- lives at [facility] (he loves it there).  After last visit I gave him a prescription for physical therapy (03/2020) at [facility] rehab. He had 6 sessions >> very helpful >> now exercises by himself.  He does not take high vitamin A doses.  He has a family history of osteoporosis in one of his sisters and possibly also in his mother.  No consistent history of hyper or hypocalcemia or hyperparathyroidism.  No history of kidney stones. Lab Results  Component Value Date   CALCIUM 9.7 05/27/2020   CALCIUM 10.4 (H) 05/14/2020   CALCIUM 7.9 (L) 05/09/2020   CALCIUM 9.2 03/21/2020   CALCIUM 9.7 12/17/2019   CALCIUM 10.0 12/09/2019   CALCIUM 9.4 05/27/2019   CALCIUM 9.0 12/19/2018   CALCIUM 8.8 (L) 09/09/2018   CALCIUM 9.2 09/08/2018   No history of thyrotoxicosis: Lab Results  Component Value Date   TSH 2.44 08/14/2018   TSH 2.61 02/19/2018   TSH 2.36 08/08/2016   TSH  2.00 08/06/2014   TSH 1.37 01/08/2014   No CKD. Last BUN/Cr: Lab Results  Component Value Date   BUN 13 05/27/2020   CREATININE 0.75 05/27/2020   H/o CABG 2011.  He also has a history of A. fib.  ROS: Constitutional: no weight gain/no weight loss, no fatigue, no subjective hyperthermia, no subjective hypothermia Eyes: no blurry vision, no xerophthalmia ENT: no sore throat, no nodules palpated in neck, no dysphagia, no  odynophagia, no hoarseness Cardiovascular: no CP/no SOB/no palpitations/no leg swelling Respiratory: no cough/no SOB/no wheezing Gastrointestinal: no N/no V/no D/no C/no acid reflux Musculoskeletal: no muscle aches/+ joint aches Skin: no rashes, no hair loss, + easy bruising Neurological: no tremors/no numbness/no tingling/no dizziness  I reviewed pt's medications, allergies, PMH, social hx, family hx, and changes were documented in the history of present illness. Otherwise, unchanged from my initial visit note.  Past Medical History:  Diagnosis Date   Anxiety    Atrial fibrillation ([place])    Blood transfusion without reported diagnosis    CAD    a. 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   Cataract    Chronic abdominal pain    Chronic nausea    Coronary artery disease    Diverticulosis 2011   Diverticulitis 2004   Dyspepsia    Esophageal reflux    GERD (gastroesophageal reflux disease)    Headache    saw neuro 4-16, MRI done   HYPERPLASIA, PROSTATE NOS W/URINARY OBST/LUTS    IBS    INGUINAL HERNIA    Lichen planus 1751   [name]   LUMBAR RADICULOPATHY, RIGHT    Mixed hyperlipidemia    OSTEOPOROSIS    PONV (postoperative nausea and vomiting)    PREMATURE VENTRICULAR CONTRACTIONS    a. After CABG - was on Coumadin/Multaq for a period of time. Coumadin discontinued 04/2010 after maintaining NSR.   UNSPECIFIED ANEMIA    Past Surgical History:  Procedure Laterality Date   BIOPSY  09/10/2018   Procedure: BIOPSY;  Surgeon: Jerene Bears, MD;  Location: [facility];  Service: Gastroenterology;;   CATARACT EXTRACTION Right 10/06/2013   CORONARY ARTERY BYPASS GRAFT     2 VD;anomalous RCA;post op compliacted by AF   ESOPHAGOGASTRODUODENOSCOPY (EGD) WITH PROPOFOL N/A 09/10/2018   Procedure: ESOPHAGOGASTRODUODENOSCOPY (EGD) WITH PROPOFOL;  Surgeon: Jerene Bears, MD;   Location: [facility];  Service: Gastroenterology;  Laterality: N/A;   HIP FRACTURE SURGERY Right 03/2007   Dr. Salvadore Farber   INGUINAL HERNIA REPAIR Left 2012   Dr Brantley Stage   KNEE SURGERY Right 1977   repair   [place]  09/19/2013   [facility]   Social History   Socioeconomic History   Marital status: Widowed    Spouse name: Not on file   Number of children: 0   Years of education: Not on file   Highest education level: Not on file  Occupational History   Occupation: retired    Fish farm manager: RETIRED  Tobacco Use   Smoking status: Former Smoker    Types: Cigarettes    Quit date: 11/27/1962    Years since quitting: 58.0   Smokeless tobacco: Never Used  Vaping Use   Vaping Use: Never used  Substance and Sexual Activity   Alcohol use: Not Currently    Alcohol/week: 0.0 standard drinks    Comment: quit drinking    Drug use: No   Sexual activity: Not Currently  Other Topics Concern   Not on file  Social History Narrative   Lives at [facility] in independent living.     Lost wife 06-2016.     Has no children.  Family: nephews-nices in [place]   Retired from [employer].     Still drives.    Social Determinants of Health   Financial Resource Strain: Low Risk    Difficulty of Paying Living Expenses: Not very hard  Food Insecurity: Not on file  Transportation Needs: Not on file  Physical Activity: Not on file  Stress: Not on file  Social Connections: Not on file  Intimate Partner Violence: Not on file   Current Outpatient Medications on File Prior to Visit  Medication Sig Dispense Refill   acetaminophen (TYLENOL) 650 MG CR tablet Take 325-650 mg by mouth every 8 (eight) hours as needed for pain.     aspirin EC 81 MG tablet Take 81 mg by mouth daily.     calcium carbonate (TUMS - DOSED IN MG ELEMENTAL CALCIUM) 500 MG chewable tablet Chew 1 tablet by mouth daily.     Cholecalciferol (VITAMIN D-3) 25 MCG (1000 UT) CAPS Take 1,000 Units by  mouth daily with breakfast.     denosumab (PROLIA) 60 MG/ML SOSY injection Inject 60 mg into the skin every 6 (six) months.     ELIQUIS 5 MG TABS tablet TAKE ONE (1) TABLET BY MOUTH TWO (2) TIMES DAILY 60 tablet 5   famotidine-calcium carbonate-magnesium hydroxide (PEPCID COMPLETE) 10-800-165 MG chewable tablet Chew 1 tablet by mouth daily as needed.     folic acid (FOLVITE) 1 MG tablet Take 1 tablet (1 mg total) by mouth daily. 30 tablet 4   loratadine (CLARITIN) 10 MG tablet Take 1 tablet (10 mg total) by mouth daily. 90 tablet 3   Multiple Vitamin (MULITIVITAMIN WITH MINERALS) TABS Take 1 tablet by mouth daily.     nitroGLYCERIN (NITROSTAT) 0.4 MG SL tablet Place 1 tablet (0.4 mg total) under the tongue every 5 (five) minutes x 3 doses as needed for chest pain. 25 tablet 3   pantoprazole (PROTONIX) 40 MG tablet Take 1 tablet (40 mg total) by mouth daily before breakfast. 90 tablet 3   rosuvastatin (CRESTOR) 10 MG tablet TAKE 1 TABLET BY MOUTH DAILY (Patient taking differently: Take 10 mg by mouth every evening.) 90 tablet 3   tamsulosin (FLOMAX) 0.4 MG CAPS capsule Take 1 capsule (0.4 mg total) by mouth daily after supper. 90 capsule 3   No current facility-administered medications on file prior to visit.   Allergies  Allergen Reactions   Amoxicillin Diarrhea, Nausea And Vomiting and Other (See Comments)    Patient questions this   Zoloft [Sertraline Hcl]     Patient didn't like the way it made him feel   Remeron [Mirtazapine] Other (See Comments)    Caused excessive drowsiness   Family History  Problem Relation Age of Onset   Stroke Mother    Pancreatic cancer Father        Deceased, 28   Breast cancer Sister        Deceased   Heart disease Sister 69   Stroke Maternal Aunt    Colon cancer Neg Hx    Prostate cancer Neg Hx    Esophageal cancer Neg Hx    Rectal cancer Neg Hx    Stomach cancer Neg Hx    PE: BP 110/72    Pulse 62    Ht 6\' 2"  (1.88 m)  Wt  163 lb 3.2 oz (74 kg)    SpO2 97%    BMI 20.95 kg/m  Wt Readings from Last 3 Encounters:  12/09/20 163 lb 3.2 oz (74 kg)  11/04/20 160 lb 6.4 oz (72.8 kg)  09/01/20 160 lb 6 oz (72.7 kg)   Constitutional: normal weight, in NAD Eyes: PERRLA, EOMI, no exophthalmos ENT: moist mucous membranes, no thyromegaly, no cervical lymphadenopathy Cardiovascular: RRR, No MRG Respiratory: CTA B Gastrointestinal: abdomen soft, NT, ND, BS+ Musculoskeletal: no deformities, strength intact in all 4 Skin: moist, warm, no rashes Neurological: no tremor with outstretched hands, DTR normal in all 4  Assessment: 1. Osteoporosis  Plan: 1. Osteoporosis -Likely age-related and she also has a family history of osteoporosis. -He has an increased risk of fracture due to previous history of fractures and also a T score which is lower than -2.5.  We reviewed together his latest bone density scan, which showed worse T-scores at femoral neck.  He has a history of lumbar compression fractures, which he thinks he developed from lifting buckets of water for his garden.  He also has a history of right hip fracture, which, from the description and the report and appears to have been a typical, osteoporotic, fracture.  However, when he developed this, he was on Fosamax.  He was ultimately advised him to stop Fosamax and he was lost this for many years.  He was restarted on this more recently, after his last bone density scan, however, he developed jaw "degeneration" (most likely not ONJ as we looked together at [facility] pictures and he adamantly denied having had a similar picture).  Per his description, his jaw was locking up while on the medication.  We discussed that this may have been due to osteoarthritis but unlikely to be due to the medication.  He was then switched to Prolia but he again developed jaw "degeneration" while on this regimen and stopped.  At our first visit, I suggested an anabolic agent, but he did not decide for  this.  - At last visit, we discussed at length about possible side effects from bisphosphonates and also Prolia, and decided to retry Prolia.  He had 2  Injections already. He saw his dentist 09/2020 >> no jaw problems. -He has no side effects from Prolia, no jaw pain, no thigh or hip pain.  In fact, he feels better at today's visit, compared to last, possibly due to consistent exercise but also possibly due to starting Prolia, since he has less joint pains now -We reviewed together his latest vitamin D level which was normal we will repeat this today -His kidney function was recently normal so we will not repeat it now -He continues on calcium 1 to 2 tablets daily and 1000 units vitamin D daily + the amount in the multivitamin. -No dental work coming up for a progress -Continue exercise and we again discussed about fall precautions.  He did not have any falls since last visit.  He has a walker, but he is not using it. -He needs a new bone density at the end of next month -I will see him back in 1 year, but sooner for Prolia injections  Orders Placed This Encounter  Procedures   VITAMIN D 25 Hydroxy (Vit-D Deficiency, Fractures)   - Total time spent for the visit: 25 minutes, in obtaining medical information from the patient and from the chart, reviewing his  previous labs, imaging evaluations, and treatments, reviewing his symptoms, counseling him about  his condition (please see the discussed topics above), and developing a plan to further investigate and treat it.  Component     Latest Ref Rng & Units 12/09/2020  Vitamin D, 25-Hydroxy     30.0 - 100.0 ng/mL 36.3  Vitamin D level is normal.  Philemon Kingdom, MD PhD [facility]

## 2020-12-09 NOTE — Patient Instructions (Addendum)
Please stop at the lab.  Continue vitamin D 1000 units daily and the multivitamin.  Please call and schedule bone density scan at the [facility] [place] Office: [phone].   Please come back for a follow-up appointment in 1 year. 

## 2020-12-10 ENCOUNTER — Encounter: Payer: Self-pay | Admitting: Internal Medicine

## 2020-12-10 LAB — VITAMIN D 25 HYDROXY (VIT D DEFICIENCY, FRACTURES): Vit D, 25-Hydroxy: 36.3 ng/mL (ref 30.0–100.0)

## 2020-12-27 ENCOUNTER — Other Ambulatory Visit: Payer: Self-pay | Admitting: Internal Medicine

## 2020-12-27 ENCOUNTER — Telehealth: Payer: Self-pay | Admitting: Internal Medicine

## 2020-12-27 DIAGNOSIS — M810 Age-related osteoporosis without current pathological fracture: Secondary | ICD-10-CM

## 2020-12-27 NOTE — Telephone Encounter (Signed)
[name] with [place] [facility] called to request an Order for Patient's Bone Density Scan be place in [name]

## 2020-12-27 NOTE — Telephone Encounter (Signed)
Called and advised bone scan ordered. Pt eligible after 01/22/21.

## 2020-12-27 NOTE — Telephone Encounter (Signed)
Can you place order?

## 2020-12-27 NOTE — Telephone Encounter (Signed)
Ok, I placed the order.  However, he will only be eligible for this after 01/22/2021.

## 2021-01-04 ENCOUNTER — Other Ambulatory Visit: Payer: Self-pay | Admitting: Cardiovascular Disease

## 2021-01-12 DIAGNOSIS — R350 Frequency of micturition: Secondary | ICD-10-CM | POA: Diagnosis not present

## 2021-01-31 ENCOUNTER — Ambulatory Visit (INDEPENDENT_AMBULATORY_CARE_PROVIDER_SITE_OTHER)
Admission: RE | Admit: 2021-01-31 | Discharge: 2021-01-31 | Disposition: A | Discharge status: Home / Self Care | Payer: Medicare Other | Source: Ambulatory Visit | Attending: Internal Medicine | Admitting: Internal Medicine

## 2021-01-31 ENCOUNTER — Other Ambulatory Visit: Payer: Self-pay

## 2021-01-31 DIAGNOSIS — M810 Age-related osteoporosis without current pathological fracture: Secondary | ICD-10-CM

## 2021-02-09 ENCOUNTER — Telehealth: Payer: Self-pay

## 2021-02-09 NOTE — Telephone Encounter (Signed)
-----   Message from Philemon Kingdom, MD sent at 02/09/2021  9:58 AM EDT ----- Can you please call pt:  Good news: The new bone density report shows that his T-scores have improved significantly at all sites (please note that the associated report is not correct).  Instead, he has the following T-scores: - Lumbar spine L1-L4 (L3): -0.9 (+12.5%#) - LFN (left hip): -2.0 (+11.1%#) (#) statistically significant

## 2021-02-09 NOTE — Telephone Encounter (Signed)
Called and left a message for pt to call back to discuss results. °

## 2021-02-10 NOTE — Telephone Encounter (Signed)
Pt returned my call to discuss results. Pt advised The new bone density report shows that his T-scores have improved significantly at all sites. Pt verbalized understanding.

## 2021-03-07 ENCOUNTER — Other Ambulatory Visit: Payer: Self-pay | Admitting: Cardiovascular Disease

## 2021-03-07 DIAGNOSIS — I480 Paroxysmal atrial fibrillation: Secondary | ICD-10-CM

## 2021-03-09 NOTE — Telephone Encounter (Signed)
Pt Last saw Dr Johnsie Cancel 03/24/20, pt has follow-up scheduled for 03/28/21 with Dr Johnsie Cancel.  Last labs 05/27/20 Creat 0.75, age 87, weight 74kg, based on specified criteria pt is on appropriate dosage of Eliquis 5mg  BID.  Will refill rx.

## 2021-03-14 NOTE — Progress Notes (Signed)
Patient ID: [name], male   DOB: [birth year], 87 y.o.   MRN: [id number]     87 y.o. with history of CABG in 2011.  Has had atypical chest pains in past. Most recent normal myovue done 05/21/20 with no ischemia and EF 57% Had post op PAF with recurrence in 2015 Multaq d/c due to GI side effects. Maintaining NSR and continues on Eliquis He takes statin for HLD More recently his PSA was elevated 5.2 05/27/20 He sees Dr Renne Crigler for osteoporosis and is on Vit D and Prolia.    No chest pain Has seen [facility] for elevated PSA but did not check labs Needs f/u labs for eliquis   Still living at [facility] last 10 years and likes it   ROS: Denies fever, malais, weight loss, blurry vision, decreased visual acuity, cough, sputum, SOB, hemoptysis, pleuritic pain, palpitaitons, heartburn, abdominal pain, melena, lower extremity edema, claudication, or rash.  All other systems reviewed and negative  General: BP 112/66   Pulse 62   Ht 6\' 2"  (1.88 m)   Wt 73.5 kg   SpO2 98%   BMI 20.80 kg/m  Affect appropriate Healthy:  appears stated age 75: normal Neck supple with no adenopathy JVP normal no bruits no thyromegaly Lungs clear with no wheezing and good diaphragmatic motion Heart:  S1/S2 no murmur, no rub, gallop or click PMI normal post sternotomy  Abdomen: benighn, BS positve, no tenderness, no AAA no bruit.  No HSM or HJR Distal pulses intact with no bruits No edema Neuro non-focal Skin warm and dry No muscular weakness   Current Outpatient Medications  Medication Sig Dispense Refill  . acetaminophen (TYLENOL) 650 MG CR tablet Take 325-650 mg by mouth every 8 (eight) hours as needed for pain.    [name] apixaban (ELIQUIS) 5 MG TABS tablet Take 1 tablet (5 mg total) by mouth 2 (two) times daily. 60 tablet 5  . aspirin EC 81 MG tablet Take 81 mg by mouth daily.    . calcium carbonate (TUMS - DOSED IN MG ELEMENTAL CALCIUM) 500 MG chewable tablet Chew 1 tablet by mouth daily.    .  Cholecalciferol (VITAMIN D-3) 25 MCG (1000 UT) CAPS Take 1,000 Units by mouth daily with breakfast.    . denosumab (PROLIA) 60 MG/ML SOSY injection Inject 60 mg into the skin every 6 (six) months.    . famotidine-calcium carbonate-magnesium hydroxide (PEPCID COMPLETE) 10-800-165 MG chewable tablet Chew 1 tablet by mouth daily as needed.    . folic acid (FOLVITE) 1 MG tablet TAKE ONE (1) TABLET BY MOUTH EACH DAY 30 tablet 0  . loratadine (CLARITIN) 10 MG tablet Take 1 tablet (10 mg total) by mouth daily. 90 tablet 3  . Multiple Vitamin (MULITIVITAMIN WITH MINERALS) TABS Take 1 tablet by mouth daily.    . nitroGLYCERIN (NITROSTAT) 0.4 MG SL tablet Place 1 tablet (0.4 mg total) under the tongue every 5 (five) minutes x 3 doses as needed for chest pain. 25 tablet 3  . rosuvastatin (CRESTOR) 10 MG tablet TAKE 1 TABLET BY MOUTH DAILY 90 tablet 3   No current facility-administered medications for this visit.    Allergies  Amoxicillin, Zoloft [sertraline hcl], and Remeron [mirtazapine]  Electrocardiogram:  4/15  SB rate 45  ICRBBB  Toprol stopped  09/26/17 SR rate 54 ICRBBB   Assessment and Plan CAD/CABG:  2011   Normal myovue 08/27/18 EF 62% and 05/21/20 with EF 57%   PAF: Maint NSR no palpitaitons on eliquis  Multaq d/c due to nausea No beta blockers due to bradycardia  CBC/PLT BMET today   GERD continue protonix concern for weight loss CT abdomen negative FU GI    Chol at goal continue crestor LDL 20   Osteoporosis:  Continue Vit D and prolia f/u [place]   Urology:  Elevated PSA labs today if elevated refer back to urology   F/U with cardiology in a year    Jenkins Rouge MD [facility]

## 2021-03-28 ENCOUNTER — Ambulatory Visit: Payer: Medicare Other | Admitting: Cardiovascular Disease

## 2021-03-28 ENCOUNTER — Encounter: Payer: Self-pay | Admitting: Cardiovascular Disease

## 2021-03-28 ENCOUNTER — Other Ambulatory Visit: Payer: Self-pay

## 2021-03-28 VITALS — BP 112/66 | HR 62 | Ht 74.0 in | Wt 162.0 lb

## 2021-03-28 DIAGNOSIS — Z0000 Encounter for general adult medical examination without abnormal findings: Secondary | ICD-10-CM

## 2021-03-28 DIAGNOSIS — Z79899 Other long term (current) drug therapy: Secondary | ICD-10-CM | POA: Diagnosis not present

## 2021-03-28 DIAGNOSIS — R972 Elevated prostate specific antigen [PSA]: Secondary | ICD-10-CM | POA: Diagnosis not present

## 2021-03-28 LAB — CBC WITH DIFFERENTIAL/PLATELET
Basophils Absolute: 0.1 10*3/uL (ref 0.0–0.2)
Basos: 1 %
EOS (ABSOLUTE): 0.5 10*3/uL — ABNORMAL HIGH (ref 0.0–0.4)
Eos: 5 %
Hematocrit: 41.1 % (ref 37.5–51.0)
Hemoglobin: 13.3 g/dL (ref 13.0–17.7)
Immature Grans (Abs): 0 10*3/uL (ref 0.0–0.1)
Immature Granulocytes: 0 %
Lymphocytes Absolute: 2.1 10*3/uL (ref 0.7–3.1)
Lymphs: 23 %
MCH: 30.6 pg (ref 26.6–33.0)
MCHC: 32.4 g/dL (ref 31.5–35.7)
MCV: 95 fL (ref 79–97)
Monocytes Absolute: 0.8 10*3/uL (ref 0.1–0.9)
Monocytes: 8 %
Neutrophils Absolute: 5.5 10*3/uL (ref 1.4–7.0)
Neutrophils: 63 %
Platelets: 189 10*3/uL (ref 150–450)
RBC: 4.35 x10E6/uL (ref 4.14–5.80)
RDW: 13.8 % (ref 11.6–15.4)
WBC: 8.9 10*3/uL (ref 3.4–10.8)

## 2021-03-28 LAB — BASIC METABOLIC PANEL
BUN/Creatinine Ratio: 23 (ref 10–24)
BUN: 19 mg/dL (ref 8–27)
CO2: 25 mmol/L (ref 20–29)
Calcium: 9.6 mg/dL (ref 8.6–10.2)
Chloride: 100 mmol/L (ref 96–106)
Creatinine, Ser: 0.82 mg/dL (ref 0.76–1.27)
Glucose: 85 mg/dL (ref 65–99)
Potassium: 5.3 mmol/L — ABNORMAL HIGH (ref 3.5–5.2)
Sodium: 139 mmol/L (ref 134–144)
eGFR: 84 mL/min/{1.73_m2} (ref >59)

## 2021-03-28 LAB — PSA: Prostate Specific Ag, Serum: 2.3 ng/mL (ref 0.0–4.0)

## 2021-03-28 NOTE — Patient Instructions (Signed)
Medication Instructions:  NO CHANGES *If you need a refill on your cardiac medications before your next appointment, please call your pharmacy*   Lab Work: TODAY PSA If you have labs (blood work) drawn today and your tests are completely normal, you will receive your results only by: [name] MyChart Message (if you have MyChart) OR . A paper copy in the mail If you have any lab test that is abnormal or we need to change your treatment, we will call you to review the results.   Testing/Procedures: NONE   Follow-Up: At [facility], you and your health needs are our priority.  As part of our continuing mission to provide you with exceptional heart care, we have created designated Provider Care Teams.  These Care Teams include your primary Cardiologist (physician) and Advanced Practice Providers (APPs -  Physician Assistants and Nurse Practitioners) who all work together to provide you with the care you need, when you need it.  We recommend signing up for the patient portal called "MyChart".  Sign up information is provided on this After Visit Summary.  MyChart is used to connect with patients for Virtual Visits (Telemedicine).  Patients are able to view lab/test results, encounter notes, upcoming appointments, etc.  Non-urgent messages can be sent to your provider as well.   To learn more about what you can do with MyChart, go to [email or web address].    Your next appointment:   12 month(s)  The format for your next appointment:   In Person  Provider:   Jenkins Rouge, MD   Other Instructions NONE

## 2021-04-17 ENCOUNTER — Telehealth: Payer: Self-pay

## 2021-04-17 NOTE — Telephone Encounter (Signed)
Prolia VOB initiated via [email or web address]  Last OV: 12/09/20 Next OV:  Last Prolia inj: 11/04/20 Next Prolia inj DUE: 05/06/21

## 2021-04-20 NOTE — Telephone Encounter (Addendum)
Pt ready for scheduling on or after 05/06/21  Out-of-pocket cost due at time of visit: $285  Primary: [facility] Medicare Prolia co-insurance: 20% ($255) Admin fee co-insurance: $30  Secondary: n/a Prolia co-insurance:  Admin fee co-insurance:   Deductible: n/a  Prior Auth: n/a PA# Valid:

## 2021-05-04 ENCOUNTER — Other Ambulatory Visit: Payer: Self-pay | Admitting: Cardiovascular Disease

## 2021-05-04 ENCOUNTER — Ambulatory Visit (INDEPENDENT_AMBULATORY_CARE_PROVIDER_SITE_OTHER): Payer: Medicare Other | Admitting: Pharmacist

## 2021-05-04 DIAGNOSIS — M810 Age-related osteoporosis without current pathological fracture: Secondary | ICD-10-CM

## 2021-05-04 DIAGNOSIS — E785 Hyperlipidemia, unspecified: Secondary | ICD-10-CM

## 2021-05-04 DIAGNOSIS — I25810 Atherosclerosis of coronary artery bypass graft(s) without angina pectoris: Secondary | ICD-10-CM | POA: Diagnosis not present

## 2021-05-04 DIAGNOSIS — E875 Hyperkalemia: Secondary | ICD-10-CM

## 2021-05-04 DIAGNOSIS — R739 Hyperglycemia, unspecified: Secondary | ICD-10-CM

## 2021-05-04 NOTE — Patient Instructions (Signed)
Visit Information  PATIENT GOALS: Goals Addressed            This Visit's Progress   . Chronic Care Management Pharmacy Care Plan   On track    CARE PLAN ENTRY (see longitudinal plan of care for additional care plan information)  Current Barriers:  . Chronic Disease Management support, education, and care coordination needs related to Afib, Pre-Diabetes, Hyperlipidemia/CAD, GERD, Osteoporosis, Allergic Rhinitis, Arthritis    Hyperlipidemia Lab Results  Component Value Date/Time   LDLCALC 20 05/27/2020 11:13 AM   . Pharmacist Clinical Goal(s): o Over the next 180 days, patient will work with PharmD and providers to maintain LDL goal < 70 . Current regimen:  o Rosuvastatin 10mg  daily . Patient self care activities - Over the next 180 days, patient will: o Maintain cholesterol medication regimen.   Pre-Diabetes Lab Results  Component Value Date/Time   HGBA1C 5.8 05/27/2020 11:13 AM   HGBA1C 6.1 01/20/2019 02:49 PM   . Pharmacist Clinical Goal(s): o Over the next 180 days, patient will work with PharmD and providers to maintain A1c goal <6.5% . Current regimen:  o Diet and exercise management    . Interventions: o Discussed diet extensively o Discussed exercise . Patient self care activities - Over the next 180 days, patient will: o Maintain a1c <6.5%  Hyperkalemia . Pharmacist Clinical Goal(s) o Over the next 180 days, patient will work with PharmD and providers to monitor potassium  . Interventions: o Discussed last potassium level - slightly elevated at 5.3 o Discussed foods that can increase potassium . Patient self care activities - Over the next 180 days, patient will: o Cut back on foods high in potassium  o Consider recheck potassium at appointment tomorrow with PCP  Osteoporosis:  . Pharmacist Clinical Goal(s) o Over the next 180 days, patient will work with PharmD and providers decrease fall risk and increase bone density . Current Therapy:  o Prolia  60mg  - receives at endocrinilogist office every 6 months - next due 05/06/2021 or after o Vitamin D 1000 IU daily  o Calcium / Tums 500mg  daily . Interventions: o Reviewed and discussed recent DEXA - printed copy for patient to pick up tomorrow o Coordinated with endocrinology office to get next Prolia scheduled.  o Discussed importance of continued calcium and vitamin d supplementation . Patient self care activities - Over the next 180 days, patient will: o Continue current therapy for bone health and to prevent fractures o If you have not heard from endocrinologist in 1 week please call office to scheduled next Prolia injection o Continue weight bearing exercise every day - walking, weight lifting. Great job!    Medication management . Pharmacist Clinical Goal(s): o Over the next 180 days, patient will work with PharmD and providers to maintain optimal medication adherence . Current pharmacy: [facility] . Interventions o Comprehensive medication review performed. o Continue current medication management strategy o Collaboration with provider regarding medication management (folic acid indication) . Patient self care activities - Over the next 180 days, patient will: o Focus on medication adherence by filling and taking medications appropriately  o Take medications as prescribed o Report any questions or concerns to PharmD and/or provider(s)  Please see past updates related to this goal by clicking on the "Past Updates" button in the selected goal         The patient verbalized understanding of instructions, educational materials, and care plan provided today and declined offer to receive copy of  patient instructions, educational materials, and care plan.   Telephone follow up appointment with care management team member scheduled for: 6 months  Cherre Robins, PharmD Clinical Pharmacist [facility] [place] [phone]

## 2021-05-04 NOTE — Chronic Care Management (AMB) (Signed)
Chronic Care Management Pharmacy Note  05/04/2021 Name:  [name] MRN:  [id number] DOB:  11-04-1930  Summary:  Patient is doing well; potassium was a little elevated when check by Dr Johnsie Cancel.   Recommendations/Changes made from today's visit: Check BMP at appointment tomorrow with Dr Larose Kells  Subjective: [name] is an 87 y.o. year old male who is a primary patient of Paz, Alda Berthold, MD.  The CCM team was consulted for assistance with disease management and care coordination needs.    Engaged with patient by telephone for follow up visit in response to provider referral for pharmacy case management and/or care coordination services.   Consent to Services:  The patient was given information about Chronic Care Management services, agreed to services, and gave verbal consent prior to initiation of services.  Please see initial visit note for detailed documentation.   Patient Care Team: Colon Branch, MD as PCP - General (Internal Medicine) Bjorn Loser, MD as Consulting Physician (Urology) Josue Hector, MD as Consulting Physician (Cardiology) Sydnee Levans, MD as Consulting Physician (Dermatology) Deneise Lever, MD as Consulting Physician (Pulmonary Disease) Alda Berthold, DO as Consulting Physician (Neurology) Inda Castle, MD (Inactive) as Consulting Physician (Gastroenterology) Clent Jacks, MD as Consulting Physician (Ophthalmology) Cherre Robins, PharmD (Pharmacist)  Recent office visits: 11/04/2020 - PCP (Dr Larose Kells) follow up visit. Stopped tamsulosin as it was not effective.   Recent consult visits: 03/28/2021 Cardio (Dr Johnsie Cancel) Cardio annual follow up. No med changes. Noted BMP showed slightly elevated potassium 5.3 11/29/2020 - Endo (Dr Cruzita Lederer) osteoporosis f/u; Diagnosed in 2008; h/o vertebral fracture; restarted Prolia in 2021. Repeat DEXA ordered. No medication changes.   Hospital visits: None in previous 6 months  Objective:  Lab Results   Component Value Date   CREATININE 0.82 03/28/2021   CREATININE 0.75 05/27/2020   CREATININE 0.85 05/14/2020    Lab Results  Component Value Date   HGBA1C 5.8 05/27/2020   Last diabetic Eye exam: No results found for: HMDIABEYEEXA  Last diabetic Foot exam: No results found for: HMDIABFOOTEX      Component Value Date/Time   CHOL 101 05/27/2020 1113   TRIG 107.0 05/27/2020 1113   HDL 59.50 05/27/2020 1113   CHOLHDL 2 05/27/2020 1113   VLDL 21.4 05/27/2020 1113   LDLCALC 20 05/27/2020 1113    Hepatic Function Latest Ref Rng & Units 03/21/2020 05/27/2019 12/19/2018  Total Protein 6.5 - 8.1 g/dL 7.2 6.7 6.9  Albumin 3.5 - 5.0 g/dL 4.1 4.2 3.9  AST 15 - 41 U/L 26 20 25   ALT 0 - 44 U/L 19 12 13   Alk Phosphatase 38 - 126 U/L 50 50 34(L)  Total Bilirubin 0.3 - 1.2 mg/dL 0.4 0.4 1.0  Bilirubin, Direct 0.0 - 0.3 mg/dL - - -    Lab Results  Component Value Date/Time   TSH 2.44 08/14/2018 01:28 PM   TSH 2.61 02/19/2018 10:46 AM   FREET4 1.21 01/08/2014 02:48 PM   FREET4 0.79 06/12/2011 11:03 AM    CBC Latest Ref Rng & Units 03/28/2021 05/14/2020 05/09/2020  WBC 3.4 - 10.8 x10E3/uL 8.9 9.6 7.4  Hemoglobin 13.0 - 17.7 g/dL 13.3 13.5 11.2(L)  Hematocrit 37.5 - 51.0 % 41.1 40.6 34.5(L)  Platelets 150 - 450 x10E3/uL 189 210 176    Lab Results  Component Value Date/Time   VD25OH 36.3 12/09/2020 10:54 AM   VD25OH 46.37 12/09/2019 12:08 PM   VD25OH 50.45 12/09/2018 02:22 PM  Clinical ASCVD: Yes  The ASCVD Risk score Mikey Bussing DC Jr., et al., 2013) failed to calculate for the following reasons:   The 2013 ASCVD risk score is only valid for ages 42 to 19    Other: (CHADS2VASc if Afib, PHQ9 if depression, MMRC or CAT for COPD, ACT, DEXA)  Social History   Tobacco Use  Smoking Status Former Smoker  . Types: Cigarettes  . Quit date: 11/27/1962  . Years since quitting: 58.4  Smokeless Tobacco Never Used   BP Readings from Last 3 Encounters:  03/28/21 112/66  12/09/20 110/72   11/04/20 (!) 152/74   Pulse Readings from Last 3 Encounters:  03/28/21 62  12/09/20 62  11/04/20 65   Wt Readings from Last 3 Encounters:  03/28/21 162 lb (73.5 kg)  12/09/20 163 lb 3.2 oz (74 kg)  11/04/20 160 lb 6.4 oz (72.8 kg)    Assessment: Review of patient past medical history, allergies, medications, health status, including review of consultants reports, laboratory and other test data, was performed as part of comprehensive evaluation and provision of chronic care management services.   SDOH:  (Social Determinants of Health) assessments and interventions performed:  SDOH Interventions   Flowsheet Row Most Recent Value  SDOH Interventions   Financial Strain Interventions Intervention Not Indicated  Physical Activity Interventions Intervention Not Indicated      CCM Care Plan  Allergies  Allergen Reactions  . Amoxicillin Diarrhea, Nausea And Vomiting and Other (See Comments)    Patient questions this  . Zoloft [Sertraline Hcl]     Patient didn't like the way it made him feel  . Remeron [Mirtazapine] Other (See Comments)    Caused excessive drowsiness    Medications Reviewed Today    Reviewed by Cherre Robins, PharmD (Pharmacist) on 05/04/21 at 57  Med List Status: <None>  Medication Order Taking? Sig Documenting Provider Last Dose Status Informant  acetaminophen (TYLENOL) 650 MG CR tablet [id number] Yes Take 325-650 mg by mouth every 8 (eight) hours as needed for pain. [provider] Taking Active Self  apixaban (ELIQUIS) 5 MG TABS tablet [id number] Yes Take 1 tablet (5 mg total) by mouth 2 (two) times daily. Josue Hector, MD Taking Active   aspirin EC 81 MG tablet [id number] Yes Take 81 mg by mouth daily. [provider] Taking Active Self  calcium carbonate (TUMS - DOSED IN MG ELEMENTAL CALCIUM) 500 MG chewable tablet [id number] Yes Chew 1 tablet by mouth daily. [provider] Taking Active Self  Cholecalciferol (VITAMIN D-3) 25 MCG  (1000 UT) CAPS [id number] Yes Take 1,000 Units by mouth daily with breakfast. [provider] Taking Active Self  denosumab (PROLIA) 60 MG/ML SOSY injection [id number] Yes Inject 60 mg into the skin every 6 (six) months. [provider] Taking Active            Med Note (CANTER, Vilma Prader D   Wed Sep 01, 2020  1:30 PM) Dr. Cruzita Lederer  famotidine-calcium carbonate-magnesium hydroxide (PEPCID COMPLETE) 10-800-165 MG chewable tablet [id number] Yes Chew 1 tablet by mouth daily as needed. [provider] Taking Active   folic acid (FOLVITE) 1 MG tablet [id number] Yes TAKE ONE (1) TABLET BY MOUTH EACH DAY Josue Hector, MD Taking Active   loratadine (CLARITIN) 10 MG tablet [id number] Yes Take 1 tablet (10 mg total) by mouth daily.  Patient taking differently: Take 10 mg by mouth daily. If needed for runny nose / allergies   Colon Branch, MD Taking Active  Multiple Vitamin (MULITIVITAMIN WITH MINERALS) TABS [id number] Yes Take 1 tablet by mouth daily. [provider] Taking Active Self  nitroGLYCERIN (NITROSTAT) 0.4 MG SL tablet [id number] Yes Place 1 tablet (0.4 mg total) under the tongue every 5 (five) minutes x 3 doses as needed for chest pain. Josue Hector, MD Taking Active Self           Med Note [facility], Cheri Rous   Fri May 14, 2020  1:25 PM) PRN  rosuvastatin (CRESTOR) 10 MG tablet [id number] Yes TAKE 1 TABLET BY MOUTH DAILY Josue Hector, MD Taking Active           Patient Active Problem List   Diagnosis Date Noted  . Abdominal pain, epigastric   . Anxiety and depression 06/22/2016  . COPD (chronic obstructive pulmonary disease) ([place]) 03/25/2016  . Annual physical exam 03/24/2016  . PCP NOTES >>>>>>>>>>>>>> 08/27/2015  . Allergic rhinitis 03/09/2015  . Headache 02/23/2015  . Bradycardia 06/15/2014  . Arthritis, degenerative 06/15/2014  . Hyperglycemia 01/08/2014  . Paroxysmal A-fib ([place]) 12/28/2013  . Diverticulosis of colon without hemorrhage 10/13/2013  .  Mixed hyperlipidemia 04/18/2010  . CAD (coronary artery disease) 02/25/2010  . HIP FRACTURE, RIGHT 12/13/2009  . IBS 09/01/2009  . LUMBAR RADICULOPATHY, RIGHT 06/09/2009  . Esophageal reflux 09/22/2008  . PALPITATIONS 05/11/2008  . HYPERPLASIA, PROSTATE NOS W/URINARY OBST/LUTS 09/20/2007  . Closed fracture of thoracic vertebra ([place]) 09/20/2007  . Osteoporosis 01/01/2007    Immunization History  Administered Date(s) Administered  . Influenza Whole 09/05/2007, 08/13/2009, 08/27/2012  . Influenza, High Dose Seasonal PF 08/20/2018, 10/02/2019, 08/30/2020  . Influenza,inj,Quad PF,6+ Mos 08/25/2016  . Influenza-Unspecified 08/26/2013, 08/28/2015, 08/28/2017  . Moderna Sars-Covid-2 Vaccination 12/10/2019, 02/05/2020, 09/29/2020  . PPD Test 05/16/2011  . Pneumococcal Conjugate-13 05/17/2015  . Pneumococcal Polysaccharide-23 05/18/2011  . Tdap 05/16/2011  . Zoster Recombinat (Shingrix) 05/20/2020, 08/26/2020    Conditions to be addressed/monitored: HLD and pre DM; osteoporosis; BPH / elevated PSA; h/o CABG; GERD; hyperkalemia; atrial fibrillation  There are no care plans that you recently modified to display for this patient.   Medication Assistance: None required.  Patient affirms current coverage meets needs.  Patient's preferred pharmacy is:  [facility] [place] - [address] [place] [address] Phone: [phone] Fax: [phone]  Uses pill box? Yes Pt endorses 100% compliance  Follow Up:  Patient agrees to Care Plan and Follow-up.  Plan: Telephone follow up appointment with care management team member scheduled for:  6 months  Cherre Robins, PharmD Clinical Pharmacist [facility] [phone]

## 2021-05-05 ENCOUNTER — Other Ambulatory Visit: Payer: Self-pay

## 2021-05-05 ENCOUNTER — Encounter: Payer: Self-pay | Admitting: Internal Medicine

## 2021-05-05 ENCOUNTER — Ambulatory Visit (INDEPENDENT_AMBULATORY_CARE_PROVIDER_SITE_OTHER): Payer: Medicare Other | Admitting: Internal Medicine

## 2021-05-05 VITALS — BP 134/70 | HR 60 | Temp 97.8°F | Resp 18 | Ht 74.0 in | Wt 162.1 lb

## 2021-05-05 DIAGNOSIS — E782 Mixed hyperlipidemia: Secondary | ICD-10-CM

## 2021-05-05 DIAGNOSIS — Z23 Encounter for immunization: Secondary | ICD-10-CM

## 2021-05-05 DIAGNOSIS — R739 Hyperglycemia, unspecified: Secondary | ICD-10-CM

## 2021-05-05 LAB — AST: AST: 22 U/L (ref 0–37)

## 2021-05-05 LAB — LIPID PANEL
Cholesterol: 105 mg/dL (ref 0–200)
HDL: 56.2 mg/dL (ref >39.00)
LDL Cholesterol: 19 mg/dL (ref 0–99)
NonHDL: 49
Total CHOL/HDL Ratio: 2
Triglycerides: 152 mg/dL — ABNORMAL HIGH (ref 0.0–149.0)
VLDL: 30.4 mg/dL (ref 0.0–40.0)

## 2021-05-05 LAB — HEMOGLOBIN A1C: Hgb A1c MFr Bld: 6.1 % (ref 4.6–6.5)

## 2021-05-05 LAB — ALT: ALT: 16 U/L (ref 0–53)

## 2021-05-05 NOTE — Patient Instructions (Addendum)
  GO TO THE LAB : Get the blood work     GO TO THE FRONT DESK, [place] Come back for a physical exam in 4 months

## 2021-05-05 NOTE — Progress Notes (Signed)
Subjective:    Patient ID: [name], male    DOB: 1930-10-11, 87 y.o.   MRN: [id number]  DOS:  05/05/2021 Type of visit - description: Routine follow-up  Today we talk about BPH, high cholesterol, immunizations. Overall feels well. Saw cardiology, note and labs reviewed.   Review of Systems Occasional low back pain, not a major issue.  Symptoms are mild and sporadic. Denies chest pain or difficulty breathing.  No edema No blood in the stool or blood in the urine Still has some nocturia  Past Medical History:  Diagnosis Date   Anxiety    Atrial fibrillation ([place])    Blood transfusion without reported diagnosis    CAD    a. 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   Cataract    Chronic abdominal pain    Chronic nausea    Coronary artery disease    Diverticulosis 2011   Diverticulitis 2004   Dyspepsia    Esophageal reflux    GERD (gastroesophageal reflux disease)    Headache    saw neuro 4-16, MRI done   HYPERPLASIA, PROSTATE NOS W/URINARY OBST/LUTS    IBS    INGUINAL HERNIA    Lichen planus [id number]   [name]   LUMBAR RADICULOPATHY, RIGHT    Mixed hyperlipidemia    OSTEOPOROSIS    PONV (postoperative nausea and vomiting)    PREMATURE VENTRICULAR CONTRACTIONS    a. After CABG - was on Coumadin/Multaq for a period of time. Coumadin discontinued 04/2010 after maintaining NSR.   UNSPECIFIED ANEMIA     Past Surgical History:  Procedure Laterality Date   BIOPSY  09/10/2018   Procedure: BIOPSY;  Surgeon: Jerene Bears, MD;  Location: [facility];  Service: Gastroenterology;;   CATARACT EXTRACTION Right 10/06/2013   CORONARY ARTERY BYPASS GRAFT     2 VD;anomalous RCA;post op compliacted by AF   ESOPHAGOGASTRODUODENOSCOPY (EGD) WITH PROPOFOL N/A 09/10/2018   Procedure: ESOPHAGOGASTRODUODENOSCOPY (EGD) WITH PROPOFOL;  Surgeon: Jerene Bears, MD;  Location: [facility];  Service:  Gastroenterology;  Laterality: N/A;   HIP FRACTURE SURGERY Right 03/2007   Dr. Salvadore Farber   INGUINAL HERNIA REPAIR Left 2012   Dr Brantley Stage   KNEE SURGERY Right 1977   repair   [place] BIOPSY  09/19/2013   [facility]    Allergies as of 05/05/2021       Reactions   Amoxicillin Diarrhea, Nausea And Vomiting, Other (See Comments)   Patient questions this   Zoloft [sertraline Hcl]    Patient didn't like the way it made him feel   Remeron [mirtazapine] Other (See Comments)   Caused excessive drowsiness        Medication List        Accurate as of May 05, 2021 11:59 PM. If you have any questions, ask your nurse or doctor.          acetaminophen 650 MG CR tablet Commonly known as: TYLENOL Take 325-650 mg by mouth every 8 (eight) hours as needed for pain.   aspirin EC 81 MG tablet Take 81 mg by mouth daily.   calcium carbonate 500 MG chewable tablet Commonly known as: TUMS - dosed in mg elemental calcium Chew 1 tablet by mouth daily.   denosumab 60 MG/ML Sosy injection Commonly known as: PROLIA Inject 60 mg into the skin every 6 (six) months.   Eliquis 5 MG Tabs tablet Generic  drug: apixaban Take 1 tablet (5 mg total) by mouth 2 (two) times daily.   famotidine-calcium carbonate-magnesium hydroxide 10-800-165 MG chewable tablet Commonly known as: PEPCID COMPLETE Chew 1 tablet by mouth daily as needed.   folic acid 1 MG tablet Commonly known as: FOLVITE TAKE ONE (1) TABLET BY MOUTH EACH DAY   loratadine 10 MG tablet Commonly known as: CLARITIN Take 1 tablet (10 mg total) by mouth daily. What changed: additional instructions   multivitamin with minerals Tabs tablet Take 1 tablet by mouth daily.   nitroGLYCERIN 0.4 MG SL tablet Commonly known as: NITROSTAT Place 1 tablet (0.4 mg total) under the tongue every 5 (five) minutes x 3 doses as needed for chest pain.   rosuvastatin 10 MG tablet Commonly known as: CRESTOR TAKE 1 TABLET BY MOUTH DAILY    Vitamin D-3 25 MCG (1000 UT) Caps Take 1,000 Units by mouth daily with breakfast.           Objective:   Physical Exam BP 134/70 (BP Location: Left Arm, Patient Position: Sitting, Cuff Size: Small)   Pulse 60   Temp 97.8 F (36.6 C) (Oral)   Resp 18   Ht 6\' 2"  (1.88 m)   Wt 162 lb 2 oz (73.5 kg)   SpO2 97%   BMI 20.82 kg/m  General:   Well developed, NAD, BMI noted. HEENT:  Normocephalic . Face symmetric, atraumatic Lungs:  CTA B Normal respiratory effort, no intercostal retractions, no accessory muscle use. Heart: Seems regular, no murmur.  Lower extremities: no pretibial edema bilaterally  Skin: Not pale. Not jaundice Neurologic:  alert & oriented X3.  Speech normal, gait appropriate for age and unassisted Psych--  Cognition and judgment appear intact.  Cooperative with normal attention span and concentration.  Behavior appropriate. No anxious or depressed appearing.      Assessment     Assessment Prediabetes, A1c 6.0 (2015) Hyperlipidemia Anxiety, depression:  -intol to zoloft, Rx lexapro 5 mg 07-2015: intolerant d/t nausea  -lost wife July 2017 CV: ---CAD--CABG 2011 ----P- Atrial fibrillation, PVCs.  Elliquis ; Multaq d/c d/t GI s/e ----Palpable Ao x years, [place] (-) for AAA 2005 COPD (per CXR 06-2015), PFTs 10-04-15 mild obstruction DJD  Gait-- uses a walker prn GI: --GERD, IBS, diverticulosis --Chronic nausea: 2019.  Work-up: 08-2018: Negative EGD, CT  and US abdomen. 12/2018 wnl Gastric empty stomach.  Symptoms decreased after Multaq stopped GU: elevated PSA, BPH , (-) bx 2014 Osteoporosis: -hip FX 2008 - dexa ~2005 --> rx fosamax, took x 1 year, dexa ~2010 was rec no fosamax at that time. Dexa 09-2015: T score -2.4 --> rx fosamax, d/c d/t jaw pain 06-2017; took prolia #25 February 2018; T score (-) 30 December 9324, per Endo Lichen planus Headache -- saw neurology 02-2015, had a MRI/MRA (-) COPD: PFTs 08-2020: Mild obstruction with some air trapping.   Insignificant response to bronchodilators.   PLAN Prediabetes: Check A1c Hyperlipidemia: On Crestor, check FLP, AST, ALT CAD, paroxysmal A. fib Saw cardiology 03/28/2021, noted to be on NSR.  Stable.  Next visit 1 year COPD: Essentially asymptomatic. Osteoporosis: Per Endo LUTS: Only reports nocturia, saw urology few months ago, subsequently cardiology checked a PSA: Improved.  No further evaluation needed at this point. Preventive care: Had 4 COVID vaccinations PNM 23 booster today given age and COPD RTC 4 months CPX  This visit occurred during the SARS-CoV-2 public health emergency.  Safety protocols were in place, including screening questions prior to the visit, additional usage  of staff PPE, and extensive cleaning of exam room while observing appropriate contact time as indicated for disinfecting solutions.

## 2021-05-06 NOTE — Telephone Encounter (Signed)
Prolia scheduled for 05/10/21

## 2021-05-07 NOTE — Assessment & Plan Note (Signed)
Prediabetes: Check A1c Hyperlipidemia: On Crestor, check FLP, AST, ALT CAD, paroxysmal A. fib Saw cardiology 03/28/2021, noted to be on NSR.  Stable.  Next visit 1 year COPD: Essentially asymptomatic. Osteoporosis: Per Endo LUTS: Only reports nocturia, saw urology few months ago, subsequently cardiology checked a PSA: Improved.  No further evaluation needed at this point. Preventive care: Had 4 COVID vaccinations PNM 23 booster today given age and COPD RTC 4 months CPX

## 2021-05-10 ENCOUNTER — Ambulatory Visit: Payer: Medicare Other

## 2021-05-10 ENCOUNTER — Other Ambulatory Visit: Payer: Self-pay

## 2021-05-10 DIAGNOSIS — M810 Age-related osteoporosis without current pathological fracture: Secondary | ICD-10-CM | POA: Diagnosis not present

## 2021-05-10 MED ORDER — DENOSUMAB 60 MG/ML ~~LOC~~ SOSY
60.0000 mg | PREFILLED_SYRINGE | Freq: Once | SUBCUTANEOUS | Status: AC
  Administered 2021-05-10: 60 mg via SUBCUTANEOUS

## 2021-05-10 NOTE — Progress Notes (Addendum)
Prolia injection administered to pt's left arm. Pt tolerated well. °

## 2021-05-11 NOTE — Telephone Encounter (Signed)
Pt received Prolia inj 05/10/21 Next inj due 11/10/21

## 2021-06-06 DIAGNOSIS — D0422 Carcinoma in situ of skin of left ear and external auricular canal: Secondary | ICD-10-CM | POA: Diagnosis not present

## 2021-06-06 DIAGNOSIS — D692 Other nonthrombocytopenic purpura: Secondary | ICD-10-CM | POA: Diagnosis not present

## 2021-06-06 DIAGNOSIS — C4441 Basal cell carcinoma of skin of scalp and neck: Secondary | ICD-10-CM | POA: Diagnosis not present

## 2021-06-06 DIAGNOSIS — D2271 Melanocytic nevi of right lower limb, including hip: Secondary | ICD-10-CM | POA: Diagnosis not present

## 2021-06-06 DIAGNOSIS — Z85828 Personal history of other malignant neoplasm of skin: Secondary | ICD-10-CM | POA: Diagnosis not present

## 2021-06-06 DIAGNOSIS — L720 Epidermal cyst: Secondary | ICD-10-CM | POA: Diagnosis not present

## 2021-06-06 DIAGNOSIS — L918 Other hypertrophic disorders of the skin: Secondary | ICD-10-CM | POA: Diagnosis not present

## 2021-06-06 DIAGNOSIS — L570 Actinic keratosis: Secondary | ICD-10-CM | POA: Diagnosis not present

## 2021-06-06 DIAGNOSIS — L814 Other melanin hyperpigmentation: Secondary | ICD-10-CM | POA: Diagnosis not present

## 2021-06-06 DIAGNOSIS — L821 Other seborrheic keratosis: Secondary | ICD-10-CM | POA: Diagnosis not present

## 2021-06-21 DIAGNOSIS — C4441 Basal cell carcinoma of skin of scalp and neck: Secondary | ICD-10-CM | POA: Diagnosis not present

## 2021-06-21 DIAGNOSIS — D0422 Carcinoma in situ of skin of left ear and external auricular canal: Secondary | ICD-10-CM | POA: Diagnosis not present

## 2021-07-11 ENCOUNTER — Telehealth: Payer: Self-pay | Admitting: Internal Medicine

## 2021-07-11 NOTE — Telephone Encounter (Signed)
Left message for patient to call back and schedule Medicare Annual Wellness Visit (AWV) in office.   If not able to come in office, please offer to do virtually or by telephone.  Left office number and my jabber [phone].  Last AWV:11/24/2019  Please schedule at anytime with Nurse Health Advisor.

## 2021-07-20 ENCOUNTER — Ambulatory Visit (INDEPENDENT_AMBULATORY_CARE_PROVIDER_SITE_OTHER): Payer: Medicare Other

## 2021-07-20 VITALS — Ht 74.0 in | Wt 161.0 lb

## 2021-07-20 DIAGNOSIS — Z0000 Encounter for general adult medical examination without abnormal findings: Secondary | ICD-10-CM

## 2021-07-20 NOTE — Progress Notes (Addendum)
Subjective:   [name] is a 87 y.o. male who presents for Medicare Annual/Subsequent preventive examination.  I connected with Morris today by telephone and verified that I am speaking with the correct person using two identifiers. Location patient: home Location provider: work Persons participating in the virtual visit: patient, Marine scientist.    I discussed the limitations, risks, security and privacy concerns of performing an evaluation and management service by telephone and the availability of in person appointments. I also discussed with the patient that there may be a patient responsible charge related to this service. The patient expressed understanding and verbally consented to this telephonic visit.    Interactive audio and video telecommunications were attempted between this provider and patient, however failed, due to patient having technical difficulties OR patient did not have access to video capability.  We continued and completed visit with audio only.  Some vital signs may be absent or patient reported.   Time Spent with patient on telephone encounter: 40 minutes   Review of Systems     Cardiac Risk Factors include: male gender;advanced age (>19mn, >>51women);dyslipidemia     Objective:    Today's Vitals   07/20/21 1502  Weight: 161 lb (73 kg)  Height: '6\' 2"'$  (1.88 m)   Body mass index is 20.67 kg/m.  Advanced Directives 07/20/2021 05/09/2020 03/21/2020 11/24/2019 12/19/2018 09/08/2018 08/16/2018  Does Patient Have a Medical Advance Directive? Yes No No Yes No No No  Type of Advance Directive Living will - - HKamrarLiving will - - -  Does patient want to make changes to medical advance directive? - - - No - Patient declined - - -  Copy of [place] Chart? - - - Yes - validated most recent copy scanned in chart (See row information) - - -  Would patient like information on creating a medical advance directive? - Yes (ED -  Information included in AVS) - - No - Patient declined No - Patient declined -  Pre-existing out of facility DNR order (yellow form or pink MOST form) - - - - - - -    Current Medications (verified) Outpatient Encounter Medications as of 07/20/2021  Medication Sig   acetaminophen (TYLENOL) 650 MG CR tablet Take 325-650 mg by mouth every 8 (eight) hours as needed for pain.   apixaban (ELIQUIS) 5 MG TABS tablet Take 1 tablet (5 mg total) by mouth 2 (two) times daily.   aspirin EC 81 MG tablet Take 81 mg by mouth daily.   calcium carbonate (TUMS - DOSED IN MG ELEMENTAL CALCIUM) 500 MG chewable tablet Chew 1 tablet by mouth daily.   Cholecalciferol (VITAMIN D-3) 25 MCG (1000 UT) CAPS Take 1,000 Units by mouth daily with breakfast.   denosumab (PROLIA) 60 MG/ML SOSY injection Inject 60 mg into the skin every 6 (six) months.   famotidine-calcium carbonate-magnesium hydroxide (PEPCID COMPLETE) 10-800-165 MG chewable tablet Chew 1 tablet by mouth daily as needed.   folic acid (FOLVITE) 1 MG tablet TAKE ONE (1) TABLET BY MOUTH EACH DAY   loratadine (CLARITIN) 10 MG tablet Take 1 tablet (10 mg total) by mouth daily. (Patient taking differently: Take 10 mg by mouth daily. If needed for runny nose / allergies)   Multiple Vitamin (MULITIVITAMIN WITH MINERALS) TABS Take 1 tablet by mouth daily.   rosuvastatin (CRESTOR) 10 MG tablet TAKE 1 TABLET BY MOUTH DAILY   nitroGLYCERIN (NITROSTAT) 0.4 MG SL tablet Place 1 tablet (0.4 mg  total) under the tongue every 5 (five) minutes x 3 doses as needed for chest pain. (Patient not taking: No sig reported)   No facility-administered encounter medications on file as of 07/20/2021.    Allergies (verified) Amoxicillin, Zoloft [sertraline hcl], and Remeron [mirtazapine]   History: Past Medical History:  Diagnosis Date   Anxiety    Atrial fibrillation ([place])    Blood transfusion without reported diagnosis    CAD    a. s/p CABGx3 01/2010 (multivessel CAD with  anomalous RCA takeoff near the L cusp) - LIMA-LAD, SVG seq to PDA and PLA.   Cataract    Chronic abdominal pain    Chronic nausea    Coronary artery disease    Diverticulosis 2011   Diverticulitis 2004   Dyspepsia    Esophageal reflux    GERD (gastroesophageal reflux disease)    Headache    saw neuro 4-16, MRI done   HYPERPLASIA, PROSTATE NOS W/URINARY OBST/LUTS    IBS    INGUINAL HERNIA    Lichen planus [id number]   [name]   LUMBAR RADICULOPATHY, RIGHT    Mixed hyperlipidemia    OSTEOPOROSIS    PONV (postoperative nausea and vomiting)    PREMATURE VENTRICULAR CONTRACTIONS    a. After CABG - was on Coumadin/Multaq for a period of time. Coumadin discontinued 04/2010 after maintaining NSR.   UNSPECIFIED ANEMIA    Past Surgical History:  Procedure Laterality Date   BIOPSY  09/10/2018   Procedure: BIOPSY;  Surgeon: Jerene Bears, MD;  Location: [facility];  Service: Gastroenterology;;   CATARACT EXTRACTION Right 10/06/2013   CORONARY ARTERY BYPASS GRAFT     2 VD;anomalous RCA;post op compliacted by AF   ESOPHAGOGASTRODUODENOSCOPY (EGD) WITH PROPOFOL N/A 09/10/2018   Procedure: ESOPHAGOGASTRODUODENOSCOPY (EGD) WITH PROPOFOL;  Surgeon: Jerene Bears, MD;  Location: [facility];  Service: Gastroenterology;  Laterality: N/A;   HIP FRACTURE SURGERY Right 03/2007   Dr. Salvadore Farber   INGUINAL HERNIA REPAIR Left 2012   Dr Brantley Stage   KNEE SURGERY Right 1977   repair   [place]  09/19/2013   [facility]   Family History  Problem Relation Age of Onset   Stroke Mother    Pancreatic cancer Father        Deceased, 95   Breast cancer Sister        Deceased   Heart disease Sister 39   Stroke Maternal Aunt    Colon cancer Neg Hx    Prostate cancer Neg Hx    Esophageal cancer Neg Hx    Rectal cancer Neg Hx    Stomach cancer Neg Hx    Social History   Socioeconomic History   Marital status: Widowed    Spouse name: Not on file   Number of children: 0   Years of  education: Not on file   Highest education level: Not on file  Occupational History   Occupation: retired    Fish farm manager: RETIRED  Tobacco Use   Smoking status: Former    Types: Cigarettes    Quit date: 11/27/1962    Years since quitting: 58.6   Smokeless tobacco: Never  Vaping Use   Vaping Use: Never used  Substance and Sexual Activity   Alcohol use: Not Currently    Alcohol/week: 0.0 standard drinks    Comment: quit drinking    Drug use: No   Sexual activity: Not Currently  Other Topics Concern   Not on file  Social History Narrative  Lives at [place] in independent living.     Lost wife 06-2016.     Has no children.  Family: nephews-nices in [place]   Retired from [employer].     Still drives.    Social Determinants of Health   Financial Resource Strain: Low Risk    Difficulty of Paying Living Expenses: Not hard at all  Food Insecurity: No Food Insecurity   Worried About Charity fundraiser in the Last Year: Never true   Reliance in the Last Year: Never true  Transportation Needs: No Transportation Needs   Lack of Transportation (Medical): No   Lack of Transportation (Non-Medical): No  Physical Activity: Sufficiently Active   Days of Exercise per Week: 7 days   Minutes of Exercise per Session: 40 min  Stress: No Stress Concern Present   Feeling of Stress : Not at all  Social Connections: Moderately Isolated   Frequency of Communication with Friends and Family: More than three times a week   Frequency of Social Gatherings with Friends and Family: More than three times a week   Attends Religious Services: More than 4 times per year   Active Member of [employer] or Organizations: No   Attends Archivist Meetings: Never   Marital Status: Widowed    Tobacco Counseling Counseling given: Not Answered   Clinical Intake:  Pre-visit preparation completed: Yes  Pain : No/denies pain     Nutritional Status: BMI of 19-24  Normal Nutritional Risks:  None Diabetes: No  How often do you need to have someone help you when you read instructions, pamphlets, or other written materials from your doctor or pharmacy?: 1 - Never  Diabetic?NO  Interpreter Needed?: No  Information entered by :: Caroleen Hamman LPN   Activities of Daily Living In your present state of health, do you have any difficulty performing the following activities: 07/20/2021 05/05/2021  Hearing? N N  Vision? N N  Difficulty concentrating or making decisions? N N  Walking or climbing stairs? N N  Dressing or bathing? N N  Doing errands, shopping? N N  Preparing Food and eating ? N -  Using the Toilet? N -  In the past six months, have you accidently leaked urine? N -  Do you have problems with loss of bowel control? N -  Managing your Medications? N -  Managing your Finances? N -  Housekeeping or managing your Housekeeping? N -  Some recent data might be hidden    Patient Care Team: Colon Branch, MD as PCP - General (Internal Medicine) Bjorn Loser, MD as Consulting Physician (Urology) Josue Hector, MD as Consulting Physician (Cardiology) Sydnee Levans, MD as Consulting Physician (Dermatology) Deneise Lever, MD as Consulting Physician (Pulmonary Disease) Alda Berthold, DO as Consulting Physician (Neurology) Inda Castle, MD (Inactive) as Consulting Physician (Gastroenterology) Clent Jacks, MD as Consulting Physician (Ophthalmology) Cherre Robins, PharmD (Pharmacist)  Indicate any recent Medical Services you may have received from other than [facility] providers in the past year (date may be approximate).     Assessment:   This is a routine wellness examination for Kaydence.  Hearing/Vision screen Hearing Screening - Comments:: C/o mild hearing loss Vision Screening - Comments:: Last eye exam-2 years ago-Has an upcoming appt with Dr. Katy Fitch  Dietary issues and exercise activities discussed: Current Exercise Habits: Home exercise routine, Type  of exercise: strength training/weights;stretching;walking, Time (Minutes): 30, Frequency (Times/Week): 7, Weekly Exercise (Minutes/Week): 210, Intensity: Mild, Exercise  limited by: None identified   Goals Addressed               This Visit's Progress     Patient Stated     Maintain current health (pt-stated)   On track      Depression Screen PHQ 2/9 Scores 07/20/2021 05/05/2021 11/26/2019 11/24/2019 12/13/2018 11/22/2018 08/21/2017  PHQ - 2 Score 1 0 0 0 '1 1 1  '$ PHQ- 9 Score - 1 0 - 2 - -  Exception Documentation - - - - - - -    Fall Risk Fall Risk  07/20/2021 05/05/2021 05/04/2021 11/24/2019 12/13/2018  Falls in the past year? 0 0 0 0 0  Number falls in past yr: 0 0 0 - -  Injury with Fall? 0 0 0 - -  Risk for fall due to : - - Impaired balance/gait;Other (Comment) - -  Follow up Falls prevention discussed Falls evaluation completed Falls evaluation completed;Falls prevention discussed Education provided;Falls prevention discussed -    FALL RISK PREVENTION PERTAINING TO THE HOME:  Any stairs in or around the home? No  Home free of loose throw rugs in walkways, pet beds, electrical cords, etc? Yes  Adequate lighting in your home to reduce risk of falls? Yes   ASSISTIVE DEVICES UTILIZED TO PREVENT FALLS:  Life alert? No  Use of a cane, walker or w/c? No  Grab bars in the bathroom? Yes  Shower chair or bench in shower? No  Elevated toilet seat or a handicapped toilet? Yes   TIMED UP AND GO:  Was the test performed? No . Phone visit   Cognitive Function:Normal cognitive status assessed by  this Nurse Health Advisor. No abnormalities found.   MMSE - Mini Mental State Exam 08/21/2017  Orientation to time 5  Orientation to Place 5  Registration 3  Attention/ Calculation 5  Recall 3  Language- name 2 objects 2  Language- repeat 1  Language- follow 3 step command 3  Language- read & follow direction 1  Write a sentence 1  Copy design 1  Total score 30         Immunizations Immunization History  Administered Date(s) Administered   Influenza Whole 09/05/2007, 08/13/2009, 08/27/2012   Influenza, High Dose Seasonal PF 08/20/2018, 10/02/2019, 08/30/2020   Influenza,inj,Quad PF,6+ Mos 08/25/2016   Influenza-Unspecified 08/26/2013, 08/28/2015, 08/28/2017   Moderna Sars-Covid-2 Vaccination 12/10/2019, 02/05/2020, 09/29/2020   PPD Test 05/16/2011   Pneumococcal Conjugate-13 05/17/2015   Pneumococcal Polysaccharide-23 05/18/2011, 05/05/2021   Tdap 05/16/2011   Zoster Recombinat (Shingrix) 05/20/2020, 08/26/2020    TDAP status: Due, Education has been provided regarding the importance of this vaccine. Advised may receive this vaccine at local pharmacy or Health Dept. Aware to provide a copy of the vaccination record if obtained from local pharmacy or Health Dept. Verbalized acceptance and understanding.  Flu Vaccine status: Up to date  Pneumococcal vaccine status: Up to date  Covid-19 vaccine status: Completed vaccines per patient. Date of last booster unknown  Qualifies for Shingles Vaccine? No   Zostavax completed No   Shingrix Completed?: Yes  Screening Tests Health Maintenance  Topic Date Due   COVID-19 Vaccine (4 - Booster for Moderna series) 12/30/2020   TETANUS/TDAP  05/15/2021   INFLUENZA VACCINE  06/27/2021   PNA vac Low Risk Adult  Completed   Zoster Vaccines- Shingrix  Completed   HPV VACCINES  Aged Out    Health Maintenance  Health Maintenance Due  Topic Date Due   COVID-19 Vaccine (4 -  Booster for Moderna series) 12/30/2020   TETANUS/TDAP  05/15/2021   INFLUENZA VACCINE  06/27/2021    Colorectal cancer screening: No longer required.   Lung Cancer Screening: (Low Dose CT Chest recommended if Age 28-80 years, 30 pack-year currently smoking OR have quit w/in 15years.) does not qualify.     Additional Screening:  Hepatitis C Screening: does not qualify  Vision Screening: Recommended annual ophthalmology exams for  early detection of glaucoma and other disorders of the eye. Is the patient up to date with their annual eye exam?  Yes  Who is the provider or what is the name of the office in which the patient attends annual eye exams? Dr. Katy Fitch   Dental Screening: Recommended annual dental exams for proper oral hygiene  Community Resource Referral / Chronic Care Management: CRR required this visit?  No   CCM required this visit?  No      Plan:     I have personally reviewed and noted the following in the patient's chart:   Medical and social history Use of alcohol, tobacco or illicit drugs  Current medications and supplements including opioid prescriptions. Patient is not currently taking opioid prescriptions. Functional ability and status Nutritional status Physical activity Advanced directives List of other physicians Hospitalizations, surgeries, and ER visits in previous 12 months Vitals Screenings to include cognitive, depression, and falls Referrals and appointments  In addition, I have reviewed and discussed with patient certain preventive protocols, quality metrics, and best practice recommendations. A written personalized care plan for preventive services as well as general preventive health recommendations were provided to patient.   Due to this being a telephonic visit, the after visit summary with patients personalized plan was offered to patient via mail or my-chart.  Per request, patient was mailed a copy of [place], LPN   [id number]  Nurse Health Advisor  Nurse Notes: None  I have reviewed and agree with [facility] documentation.  Kathlene November, MD

## 2021-07-20 NOTE — Patient Instructions (Signed)
[name] , Thank you for taking time to complete your Medicare Wellness Visit. I appreciate your ongoing commitment to your health goals. Please review the following plan we discussed and let me know if I can assist you in the future.   Screening recommendations/referrals: Colonoscopy: No longer required Recommended yearly ophthalmology/optometry visit for glaucoma screening and checkup Recommended yearly dental visit for hygiene and checkup  Vaccinations: Influenza vaccine: Due Pneumococcal vaccine: Up to date Tdap vaccine: Discus with pharmacy Shingles vaccine: Completed vaccines   Covid-19: UP to date  Advanced directives: Copy in chart  Conditions/risks identified: See problem list  Next appointment: Follow up in one year for your annual wellness visit.   Preventive Care 33 Years and Older, Male Preventive care refers to lifestyle choices and visits with your health care provider that can promote health and wellness. What does preventive care include? A yearly physical exam. This is also called an annual well check. Dental exams once or twice a year. Routine eye exams. Ask your health care provider how often you should have your eyes checked. Personal lifestyle choices, including: Daily care of your teeth and gums. Regular physical activity. Eating a healthy diet. Avoiding tobacco and drug use. Limiting alcohol use. Practicing safe sex. Taking low doses of aspirin every day. Taking vitamin and mineral supplements as recommended by your health care provider. What happens during an annual well check? The services and screenings done by your health care provider during your annual well check will depend on your age, overall health, lifestyle risk factors, and family history of disease. Counseling  Your health care provider may ask you questions about your: Alcohol use. Tobacco use. Drug use. Emotional well-being. Home and relationship well-being. Sexual activity. Eating  habits. History of falls. Memory and ability to understand (cognition). Work and work Statistician. Screening  You may have the following tests or measurements: Height, weight, and BMI. Blood pressure. Lipid and cholesterol levels. These may be checked every 5 years, or more frequently if you are over 58 years old. Skin check. Lung cancer screening. You may have this screening every year starting at age 85 if you have a 30-pack-year history of smoking and currently smoke or have quit within the past 85 years. Fecal occult blood test (FOBT) of the stool. You may have this test every year starting at age 85. Flexible sigmoidoscopy or colonoscopy. You may have a sigmoidoscopy every 5 years or a colonoscopy every 10 years starting at age 17. Prostate cancer screening. Recommendations will vary depending on your family history and other risks. Hepatitis C blood test. Hepatitis B blood test. Sexually transmitted disease (STD) testing. Diabetes screening. This is done by checking your blood sugar (glucose) after you have not eaten for a while (fasting). You may have this done every 1-3 years. Abdominal aortic aneurysm (AAA) screening. You may need this if you are a current or former smoker. Osteoporosis. You may be screened starting at age 85 if you are at high risk. Talk with your health care provider about your test results, treatment options, and if necessary, the need for more tests. Vaccines  Your health care provider may recommend certain vaccines, such as: Influenza vaccine. This is recommended every year. Tetanus, diphtheria, and acellular pertussis (Tdap, Td) vaccine. You may need a Td booster every 10 years. Zoster vaccine. You may need this after age 69. Pneumococcal 13-valent conjugate (PCV13) vaccine. One dose is recommended after age 35. Pneumococcal polysaccharide (PPSV23) vaccine. One dose is recommended after age 85.  Talk to your health care provider about which screenings and  vaccines you need and how often you need them. This information is not intended to replace advice given to you by your health care provider. Make sure you discuss any questions you have with your health care provider. Document Released: 12/10/2015 Document Revised: 08/02/2016 Document Reviewed: 09/14/2015 Elsevier Interactive Patient Education  2017 [place] Prevention in the Home Falls can cause injuries. They can happen to people of all ages. There are many things you can do to make your home safe and to help prevent falls. What can I do on the outside of my home? Regularly fix the edges of walkways and driveways and fix any cracks. Remove anything that might make you trip as you walk through a door, such as a raised step or threshold. Trim any bushes or trees on the path to your home. Use bright outdoor lighting. Clear any walking paths of anything that might make someone trip, such as rocks or tools. Regularly check to see if handrails are loose or broken. Make sure that both sides of any steps have handrails. Any raised decks and porches should have guardrails on the edges. Have any leaves, snow, or ice cleared regularly. Use sand or salt on walking paths during winter. Clean up any spills in your garage right away. This includes oil or grease spills. What can I do in the bathroom? Use night lights. Install grab bars by the toilet and in the tub and shower. Do not use towel bars as grab bars. Use non-skid mats or decals in the tub or shower. If you need to sit down in the shower, use a plastic, non-slip stool. Keep the floor dry. Clean up any water that spills on the floor as soon as it happens. Remove soap buildup in the tub or shower regularly. Attach bath mats securely with double-sided non-slip rug tape. Do not have throw rugs and other things on the floor that can make you trip. What can I do in the bedroom? Use night lights. Make sure that you have a light by your  bed that is easy to reach. Do not use any sheets or blankets that are too big for your bed. They should not hang down onto the floor. Have a firm chair that has side arms. You can use this for support while you get dressed. Do not have throw rugs and other things on the floor that can make you trip. What can I do in the kitchen? Clean up any spills right away. Avoid walking on wet floors. Keep items that you use a lot in easy-to-reach places. If you need to reach something above you, use a strong step stool that has a grab bar. Keep electrical cords out of the way. Do not use floor polish or wax that makes floors slippery. If you must use wax, use non-skid floor wax. Do not have throw rugs and other things on the floor that can make you trip. What can I do with my stairs? Do not leave any items on the stairs. Make sure that there are handrails on both sides of the stairs and use them. Fix handrails that are broken or loose. Make sure that handrails are as long as the stairways. Check any carpeting to make sure that it is firmly attached to the stairs. Fix any carpet that is loose or worn. Avoid having throw rugs at the top or bottom of the stairs. If you do have throw  rugs, attach them to the floor with carpet tape. Make sure that you have a light switch at the top of the stairs and the bottom of the stairs. If you do not have them, ask someone to add them for you. What else can I do to help prevent falls? Wear shoes that: Do not have high heels. Have rubber bottoms. Are comfortable and fit you well. Are closed at the toe. Do not wear sandals. If you use a stepladder: Make sure that it is fully opened. Do not climb a closed stepladder. Make sure that both sides of the stepladder are locked into place. Ask someone to hold it for you, if possible. Clearly mark and make sure that you can see: Any grab bars or handrails. First and last steps. Where the edge of each step is. Use tools that  help you move around (mobility aids) if they are needed. These include: Canes. Walkers. Scooters. Crutches. Turn on the lights when you go into a dark area. Replace any light bulbs as soon as they burn out. Set up your furniture so you have a clear path. Avoid moving your furniture around. If any of your floors are uneven, fix them. If there are any pets around you, be aware of where they are. Review your medicines with your doctor. Some medicines can make you feel dizzy. This can increase your chance of falling. Ask your doctor what other things that you can do to help prevent falls. This information is not intended to replace advice given to you by your health care provider. Make sure you discuss any questions you have with your health care provider. Document Released: 09/09/2009 Document Revised: 04/20/2016 Document Reviewed: 12/18/2014 Elsevier Interactive Patient Education  2017 [employer].

## 2021-07-23 ENCOUNTER — Encounter (HOSPITAL_COMMUNITY): Payer: Self-pay | Admitting: Emergency Medicine

## 2021-07-23 ENCOUNTER — Emergency Department (HOSPITAL_COMMUNITY)
Admission: EM | Admit: 2021-07-23 | Discharge: 2021-07-23 | Disposition: A | Discharge status: Home / Self Care | Payer: Medicare Other | Attending: Emergency Medicine | Admitting: Emergency Medicine

## 2021-07-23 ENCOUNTER — Other Ambulatory Visit: Payer: Self-pay

## 2021-07-23 ENCOUNTER — Emergency Department (HOSPITAL_COMMUNITY): Payer: Medicare Other

## 2021-07-23 DIAGNOSIS — R0602 Shortness of breath: Secondary | ICD-10-CM | POA: Diagnosis not present

## 2021-07-23 DIAGNOSIS — I2510 Atherosclerotic heart disease of native coronary artery without angina pectoris: Secondary | ICD-10-CM | POA: Diagnosis not present

## 2021-07-23 DIAGNOSIS — R079 Chest pain, unspecified: Secondary | ICD-10-CM

## 2021-07-23 DIAGNOSIS — J449 Chronic obstructive pulmonary disease, unspecified: Secondary | ICD-10-CM | POA: Diagnosis not present

## 2021-07-23 DIAGNOSIS — Z743 Need for continuous supervision: Secondary | ICD-10-CM | POA: Diagnosis not present

## 2021-07-23 DIAGNOSIS — Z7901 Long term (current) use of anticoagulants: Secondary | ICD-10-CM | POA: Diagnosis not present

## 2021-07-23 DIAGNOSIS — R002 Palpitations: Secondary | ICD-10-CM | POA: Insufficient documentation

## 2021-07-23 DIAGNOSIS — Z87891 Personal history of nicotine dependence: Secondary | ICD-10-CM | POA: Diagnosis not present

## 2021-07-23 DIAGNOSIS — R1110 Vomiting, unspecified: Secondary | ICD-10-CM | POA: Diagnosis not present

## 2021-07-23 DIAGNOSIS — Z79899 Other long term (current) drug therapy: Secondary | ICD-10-CM | POA: Diagnosis not present

## 2021-07-23 DIAGNOSIS — Z951 Presence of aortocoronary bypass graft: Secondary | ICD-10-CM | POA: Insufficient documentation

## 2021-07-23 DIAGNOSIS — Z7982 Long term (current) use of aspirin: Secondary | ICD-10-CM | POA: Diagnosis not present

## 2021-07-23 DIAGNOSIS — I499 Cardiac arrhythmia, unspecified: Secondary | ICD-10-CM | POA: Diagnosis not present

## 2021-07-23 LAB — CBC WITH DIFFERENTIAL/PLATELET
Abs Immature Granulocytes: 0.02 10*3/uL (ref 0.00–0.07)
Basophils Absolute: 0.1 10*3/uL (ref 0.0–0.1)
Basophils Relative: 1 %
Eosinophils Absolute: 0.2 10*3/uL (ref 0.0–0.5)
Eosinophils Relative: 3 %
HCT: 42.4 % (ref 39.0–52.0)
Hemoglobin: 13.8 g/dL (ref 13.0–17.0)
Immature Granulocytes: 0 %
Lymphocytes Relative: 20 %
Lymphs Abs: 1.7 10*3/uL (ref 0.7–4.0)
MCH: 31.2 pg (ref 26.0–34.0)
MCHC: 32.5 g/dL (ref 30.0–36.0)
MCV: 95.7 fL (ref 80.0–100.0)
Monocytes Absolute: 0.7 10*3/uL (ref 0.1–1.0)
Monocytes Relative: 8 %
Neutro Abs: 5.7 10*3/uL (ref 1.7–7.7)
Neutrophils Relative %: 68 %
Platelets: 160 10*3/uL (ref 150–400)
RBC: 4.43 MIL/uL (ref 4.22–5.81)
RDW: 16.2 % — ABNORMAL HIGH (ref 11.5–15.5)
WBC: 8.3 10*3/uL (ref 4.0–10.5)
nRBC: 0 % (ref 0.0–0.2)

## 2021-07-23 LAB — BASIC METABOLIC PANEL
Anion gap: 7 (ref 5–15)
BUN: 16 mg/dL (ref 8–23)
CO2: 26 mmol/L (ref 22–32)
Calcium: 9.5 mg/dL (ref 8.9–10.3)
Chloride: 104 mmol/L (ref 98–111)
Creatinine, Ser: 0.87 mg/dL (ref 0.61–1.24)
GFR, Estimated: >60 mL/min (ref >60)
Glucose, Bld: 115 mg/dL — ABNORMAL HIGH (ref 70–99)
Potassium: 4.7 mmol/L (ref 3.5–5.1)
Sodium: 137 mmol/L (ref 135–145)

## 2021-07-23 LAB — TROPONIN I (HIGH SENSITIVITY)
Troponin I (High Sensitivity): 25 ng/L — ABNORMAL HIGH (ref <18)
Troponin I (High Sensitivity): 26 ng/L — ABNORMAL HIGH (ref <18)

## 2021-07-23 LAB — MAGNESIUM: Magnesium: 2.2 mg/dL (ref 1.7–2.4)

## 2021-07-23 NOTE — ED Provider Notes (Signed)
[facility] EMERGENCY DEPARTMENT Provider Note   CSN: [id number] Arrival date & time: 07/23/21  1443     History Chief Complaint  Patient presents with   Palpitations    [name] is a 87 y.o. male.  The history is provided by the patient and medical records. No language interpreter was used.  Palpitations Palpitations quality:  Irregular Onset quality:  At rest Duration:  1 hour Timing:  Intermittent Progression:  Waxing and waning Chronicity:  Recurrent Relieved by:  Nothing Worsened by:  Nothing Ineffective treatments:  None tried Associated symptoms: shortness of breath   Associated symptoms: no back pain, no chest pain, no chest pressure, no cough, no diaphoresis, no dizziness, no leg pain, no lower extremity edema, no malaise/fatigue, no nausea, no near-syncope, no numbness and no vomiting   Risk factors: hx of atrial fibrillation       Past Medical History:  Diagnosis Date   Anxiety    Atrial fibrillation (HCC)    Blood transfusion without reported diagnosis    CAD    a. 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   Cataract    Chronic abdominal pain    Chronic nausea    Coronary artery disease    Diverticulosis 2011   Diverticulitis 2004   Dyspepsia    Esophageal reflux    GERD (gastroesophageal reflux disease)    Headache    saw neuro 4-16, MRI done   HYPERPLASIA, PROSTATE NOS W/URINARY OBST/LUTS    IBS    INGUINAL HERNIA    Lichen planus [id number]   [name]   LUMBAR RADICULOPATHY, RIGHT    Mixed hyperlipidemia    OSTEOPOROSIS    PONV (postoperative nausea and vomiting)    PREMATURE VENTRICULAR CONTRACTIONS    a. After CABG - was on Coumadin/Multaq for a period of time. Coumadin discontinued 04/2010 after maintaining NSR.   UNSPECIFIED ANEMIA     Patient Active Problem List   Diagnosis Date Noted   Abdominal pain, epigastric    Anxiety and depression  06/22/2016   COPD (chronic obstructive pulmonary disease) ([place]) 03/25/2016   Annual physical exam 03/24/2016   PCP NOTES >>>>>>>>>>>>>> 08/27/2015   Allergic rhinitis 03/09/2015   Headache 02/23/2015   Bradycardia 06/15/2014   Arthritis, degenerative 06/15/2014   Hyperglycemia 01/08/2014   Paroxysmal A-fib ([place]) 12/28/2013   Diverticulosis of colon without hemorrhage 10/13/2013   Mixed hyperlipidemia 04/18/2010   CAD (coronary artery disease) 02/25/2010   HIP FRACTURE, RIGHT 12/13/2009   IBS 09/01/2009   LUMBAR RADICULOPATHY, RIGHT 06/09/2009   Esophageal reflux 09/22/2008   PALPITATIONS 05/11/2008   HYPERPLASIA, PROSTATE NOS W/URINARY OBST/LUTS 09/20/2007   Closed fracture of thoracic vertebra ([place]) 09/20/2007   Osteoporosis 01/01/2007    Past Surgical History:  Procedure Laterality Date   BIOPSY  09/10/2018   Procedure: BIOPSY;  Surgeon: Jerene Bears, MD;  Location: [facility];  Service: Gastroenterology;;   CATARACT EXTRACTION Right 10/06/2013   CORONARY ARTERY BYPASS GRAFT     2 VD;anomalous RCA;post op compliacted by AF   ESOPHAGOGASTRODUODENOSCOPY (EGD) WITH PROPOFOL N/A 09/10/2018   Procedure: ESOPHAGOGASTRODUODENOSCOPY (EGD) WITH PROPOFOL;  Surgeon: Jerene Bears, MD;  Location: [facility];  Service: Gastroenterology;  Laterality: N/A;   HIP FRACTURE SURGERY Right 03/2007   Dr. Salvadore Farber   INGUINAL HERNIA REPAIR Left 2012   Dr Brantley Stage   KNEE SURGERY Right [phone]  repair   PROSTATE BIOPSY  09/19/2013   [facility]       Family History  Problem Relation Age of Onset   Stroke Mother    Pancreatic cancer Father        Deceased, 63   Breast cancer Sister        Deceased   Heart disease Sister 55   Stroke Maternal Aunt    Colon cancer Neg Hx    Prostate cancer Neg Hx    Esophageal cancer Neg Hx    Rectal cancer Neg Hx    Stomach cancer Neg Hx     Social History   Tobacco Use   Smoking status: Former    Types: Cigarettes    Quit date: 11/27/1962     Years since quitting: 58.6   Smokeless tobacco: Never  Vaping Use   Vaping Use: Never used  Substance Use Topics   Alcohol use: Not Currently    Alcohol/week: 0.0 standard drinks    Comment: quit drinking    Drug use: No    Home Medications Prior to Admission medications   Medication Sig Start Date End Date Taking? Authorizing Provider  acetaminophen (TYLENOL) 650 MG CR tablet Take 325-650 mg by mouth every 8 (eight) hours as needed for pain.    [provider]  apixaban (ELIQUIS) 5 MG TABS tablet Take 1 tablet (5 mg total) by mouth 2 (two) times daily. 03/09/21   Josue Hector, MD  aspirin EC 81 MG tablet Take 81 mg by mouth daily.    [provider]  calcium carbonate (TUMS - DOSED IN MG ELEMENTAL CALCIUM) 500 MG chewable tablet Chew 1 tablet by mouth daily.    [provider]  Cholecalciferol (VITAMIN D-3) 25 MCG (1000 UT) CAPS Take 1,000 Units by mouth daily with breakfast.    [provider]  denosumab (PROLIA) 60 MG/ML SOSY injection Inject 60 mg into the skin every 6 (six) months.    [provider]  famotidine-calcium carbonate-magnesium hydroxide (PEPCID COMPLETE) 10-800-165 MG chewable tablet Chew 1 tablet by mouth daily as needed.    [provider]  folic acid (FOLVITE) 1 MG tablet TAKE ONE (1) TABLET BY MOUTH EACH DAY 05/04/21   Josue Hector, MD  loratadine (CLARITIN) 10 MG tablet Take 1 tablet (10 mg total) by mouth daily. Patient taking differently: Take 10 mg by mouth daily. If needed for runny nose / allergies 09/06/20   Colon Branch, MD  Multiple Vitamin (MULITIVITAMIN WITH MINERALS) TABS Take 1 tablet by mouth daily.    [provider]  nitroGLYCERIN (NITROSTAT) 0.4 MG SL tablet Place 1 tablet (0.4 mg total) under the tongue every 5 (five) minutes x 3 doses as needed for chest pain. Patient not taking: No sig reported 03/24/20   Josue Hector, MD  rosuvastatin (CRESTOR) 10 MG tablet TAKE 1 TABLET BY  MOUTH DAILY 01/04/21   Josue Hector, MD    Allergies    Amoxicillin, Zoloft [sertraline hcl], and Remeron [mirtazapine]  Review of Systems   Review of Systems  Constitutional:  Negative for chills, diaphoresis, fatigue, fever and malaise/fatigue.  HENT:  Negative for congestion.   Respiratory:  Positive for shortness of breath. Negative for cough, chest tightness and wheezing.   Cardiovascular:  Positive for palpitations. Negative for chest pain, leg swelling and near-syncope.  Gastrointestinal:  Negative for abdominal pain, constipation, diarrhea, nausea and vomiting.  Genitourinary:  Negative for flank pain and frequency.  Musculoskeletal:  Negative for back pain.  Skin:  Negative for rash and wound.  Neurological:  Negative for dizziness, light-headedness, numbness and headaches.  Psychiatric/Behavioral:  Negative for agitation and confusion.   All other systems reviewed and are negative.  Physical Exam Updated Vital Signs BP 140/75   Pulse 90   Temp 98.8 F (37.1 C) (Oral)   Resp 15   SpO2 99%   Physical Exam Vitals and nursing note reviewed.  Constitutional:      General: He is not in acute distress.    Appearance: He is well-developed. He is not ill-appearing, toxic-appearing or diaphoretic.  HENT:     Head: Normocephalic and atraumatic.     Nose: No congestion or rhinorrhea.     Mouth/Throat:     Mouth: Mucous membranes are moist.     Pharynx: No oropharyngeal exudate or posterior oropharyngeal erythema.  Eyes:     Extraocular Movements: Extraocular movements intact.     Conjunctiva/sclera: Conjunctivae normal.     Pupils: Pupils are equal, round, and reactive to light.  Cardiovascular:     Rate and Rhythm: Normal rate and regular rhythm.     Heart sounds: No murmur heard. Pulmonary:     Effort: Pulmonary effort is normal. No respiratory distress.     Breath sounds: Normal breath sounds. No wheezing, rhonchi or rales.  Chest:     Chest wall: No tenderness.   Abdominal:     General: Abdomen is flat.     Palpations: Abdomen is soft.     Tenderness: There is no abdominal tenderness. There is no guarding or rebound.  Musculoskeletal:        General: No tenderness.     Cervical back: Neck supple. No tenderness.     Right lower leg: No edema.     Left lower leg: No edema.  Skin:    General: Skin is warm and dry.     Capillary Refill: Capillary refill takes less than 2 seconds.     Findings: No erythema.  Neurological:     General: No focal deficit present.     Mental Status: He is alert.     Sensory: No sensory deficit.     Motor: No weakness.  Psychiatric:        Mood and Affect: Mood normal.    ED Results / Procedures / Treatments   Labs (all labs ordered are listed, but only abnormal results are displayed) Labs Reviewed  CBC WITH DIFFERENTIAL/PLATELET - Abnormal; Notable for the following components:      Result Value   RDW 16.2 (*)    All other components within normal limits  MAGNESIUM  BASIC METABOLIC PANEL  TROPONIN I (HIGH SENSITIVITY)    EKG EKG Interpretation  Date/Time:  Saturday July 23 2021 14:59:14 EDT Ventricular Rate:  74 PR Interval:  188 QRS Duration: 108 QT Interval:  374 QTC Calculation: 415 R Axis:   28 Text Interpretation: Sinus rhythm with marked sinus arrhythmia Incomplete right bundle branch block Borderline ECG When compared to prior, similar appearance with more irregularity, No STEMI Confirmed by Antony Blackbird [phone]) on 07/23/2021 4:30:18 PM  Radiology DG Chest 2 View  Result Date: 07/23/2021 CLINICAL DATA:  Palpitations. EXAM: CHEST - 2 VIEW COMPARISON:  Chest x-ray dated September 01, 2020. FINDINGS: The heart size and mediastinal contours are within normal limits. Prior CABG. Chronically coarsened interstitial markings and biapical pleuroparenchymal scarring again noted. No focal consolidation, pleural effusion, or pneumothorax. No acute osseous abnormality. Multiple chronic thoracic  compression deformities again noted. IMPRESSION: 1. No acute cardiopulmonary disease. Electronically Signed   By: Titus Dubin M.D.   On: 07/23/2021 16:25    Procedures Procedures   Medications Ordered in ED Medications - No data to display  ED Course  I have reviewed the triage vital signs and the nursing notes.  Pertinent labs & imaging results that were available during my care of the patient were reviewed by me and considered in my medical decision making (see chart for details).    MDM Rules/Calculators/A&P                           QUILLAN BORK is a 87 y.o. male with a past medical history significant for CAD with prior CABG, paroxysmal atrial fibrillation, COPD, osteoporosis, and IBS who presents for palpitations.  He reports that this morning  he had around 1 hour of palpitations that cause him shortness of breath.  He reports that resolved and he only is briefly having intermittent palpitations now.  Denies any chest pain or shortness of breath.  Denies any nausea, vomiting, diaphoresis.  Denies any recent fevers, chills congestion, cough, nausea, vomiting, constipation, diarrhea, urinary symptoms.  Denies leg pain or leg swelling.  Denies any other changes or concerns.  On exam, lungs clear and chest nontender.  Abdomen nontender.  No murmur.  Good pulses in extremities.  No focal neurologic deficits.  Patient resting comfortably.  Patient is feeling occasional PVCs that are seen on telemetry monitoring.  EKG showed no STEMI and no evidence of A. fib at this time.  Patient will get screening labs and reassessment.  If work-up is reassuring, anticipate this is symptomatic PVCs and he will be able to follow-up with his cardiologist.   Labs were overall reassuring.  I reviewed his telemetry with no significant A. fib or other acute abnormalities.  Patient is feeling well and was observed for over 7 hours without any new persistent palpitations or arrhythmia.  He would like to go  home and follow-up with his cardiologist.  Patient discharged in good condition with likely symptomatic PVCs and we discussed this could be from either dehydration or idiopathic.  Patient agrees and was discharged   Final Clinical Impression(s) / ED Diagnoses Final diagnoses:  Palpitations    Rx / DC Orders ED Discharge Orders     None      Clinical Impression: 1. Palpitations   2. Chest pain     Disposition: Discharge  Condition: Good  I have discussed the results, Dx and Tx plan with the pt(& family if present). He/she/they expressed understanding and agree(s) with the plan. Discharge instructions discussed at great length. Strict return precautions discussed and pt &/or family have verbalized understanding of the instructions. No further questions at time of discharge.    Discharge Medication List as of 07/23/2021 10:00 PM      Follow Up: your cardiologist     [facility] [id number] [place], [place], [place] [address] [place] [address] [phone]        Galia Rahm, Gwenyth Allegra, MD 07/24/21 0100

## 2021-07-23 NOTE — ED Provider Notes (Signed)
Emergency Medicine Provider Triage Evaluation Note  [name] , a 87 y.o. male  was evaluated in triage.  Pt complains of palpitations.  Seems that they occurred between 5 and 6:30 AM is when they started.  He states that he does not feel lightheaded denies any chest pain or shortness of breath.  Has a history of A. fib states he was taken back of medications because he did not have any other episodes of A. fib.  Denies any recreational drug use or alcohol use or withdrawal.  Denies any fevers or chills.  States that he feels much improved currently not currently having heart palpitations.  Review of Systems  Positive: Heart palpitations Negative: Fever  Physical Exam  There were no vitals taken for this visit. Gen:   Awake, no distress   Resp:  Normal effort  MSK:   Moves extremities without difficulty  Other:  Regular rate and regular rhythm.  No murmurs rubs or gallops.  Medical Decision Making  Medically screening exam initiated at 3:14 PM.  Appropriate orders placed.  [name] was informed that the remainder of the evaluation will be completed by another provider, this initial triage assessment does not replace that evaluation, and the importance of remaining in the ED until their evaluation is complete.  Patient has had regular interval TSH labs in the past which have been within normal limits.     [name] [name], [place] 07/23/21 1524    Eric Leigh, MD 07/26/21 [phone]

## 2021-07-23 NOTE — ED Triage Notes (Signed)
Pt BIB [facility] from [facility], complaint of palpitations that began early this morning and have come and gone all day worse around 12.

## 2021-07-23 NOTE — ED Notes (Signed)
DC instructions reviewed with pt. Pt verbalized understanding.  Pt DC 

## 2021-07-23 NOTE — Discharge Instructions (Addendum)
Your history, exam, work-up today are consistent with symptomatic palpitations as was on the monitor.  Your other work-up was similar to prior and reassuring.  Your troponin was slightly elevated but was essentially unchanging.  After monitoring for over 7 hours, we feel you are safe for discharge home.  There were no significant arrhythmias seen on the monitor.  Please call to follow-up with your primary doctor and cardiologist this week.  Please rest and stay hydrated.  If any symptoms change or worsen acutely, please return to the nearest emergency department.

## 2021-07-25 ENCOUNTER — Telehealth: Payer: Self-pay | Admitting: Cardiovascular Disease

## 2021-07-25 NOTE — Telephone Encounter (Signed)
Patient c/o Palpitations:  High priority if patient c/o lightheadedness, shortness of breath, or chest pain  How long have you had palpitations/irregular HR/ Afib? Are you having the symptoms now? Yes a hour ago   Are you currently experiencing lightheadedness, SOB or CP?  No not right now  Do you have a history of afib (atrial fibrillation) or irregular heart rhythm? yes  Have you checked your BP or HR? (document readings if available):  Yes but he does not have the reading   Are you experiencing any other symptoms?

## 2021-07-25 NOTE — Telephone Encounter (Signed)
Returned call to Pt.  Pt needs f/u s/p ER visit for symptomatic PVC's.  Pt scheduled 07/27/21 at 8:15 am  Pt aware.  Thanked nurse for call back.

## 2021-07-26 NOTE — Progress Notes (Addendum)
Cardiology Office Note:    Date:  07/27/2021   ID:  [name], DOB [birth year], MRN [id number]  PCP:  Eric Branch, MD   [facility] Providers Cardiologist:  Eric Rouge, MD      Referring MD: Eric Branch, MD   Chief Complaint:  F/u from ED visit with palpitations    Patient Profile:    [name] is a 87 y.o. male with:  Coronary artery disease  S/p CABG in 2011 Myoview 6/21: Low risk Paroxysmal atrial fibrillation Dronedarone DC'd secondary to GI side effects Not on beta-blocker due to bradycardia Anticoagulation: Apixaban Hyperlipidemia Osteoporosis Elevated PSA GERD   Prior CV studies:  GATED SPECT MYO PERF W/LEXISCAN STRESS 1D 05/21/2020 Normal stress nuclear study with no ischemia or infarction.  Gated ejection fraction 57% with normal wall motion.  Echocardiogram 08/30/2015 EF 55-60, no RWMA, GR 1 DD, trivial AI, mild RAE   History of Present Illness: [name] was last seen by Dr. Johnsie [name] in 5/22.  He was seen in the emergency room 07/23/2021 with palpitations.  [name] palpitations resolved by the time he got to the emergency room.  No arrhythmias were noted on electrocardiogram or telemetry.  I personally reviewed [name] electrocardiogram and this demonstrated normal sinus rhythm with incomplete right bundle [name] block.  There were no ST-T wave changes.  Labs: K+ on 4.7, creatinine 0.7, magnesium 2.2, Hgb 13.8; hs-Trop 25 >>26.  He was asked to follow-up with cardiology for possible symptomatic PVCs.  He is here alone today.  He notes that on the day he went to the emergency room, he felt poorly for several hours.  He felt that [name] heart was going fast.  He was told by EMS when they arrived that they did see an irregular heartbeat.  He notes that the ER physician told him that they saw PVCs on [name] monitor.  He has not had syncope.  On the day he went to the emergency room, he was somewhat short of breath.  Since that day, he has had indigestion.  This is unusual for  him.  He has had relief with taking Tums.  He has not had exertional symptoms.  He has not had orthopnea, PND, leg edema.    Past Medical History:  Diagnosis Date   Anxiety    Atrial fibrillation ([place])    Blood transfusion without reported diagnosis    CAD    a. 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   Cataract    Chronic abdominal pain    Chronic nausea    Coronary artery disease    Diverticulosis 2011   Diverticulitis 2004   Dyspepsia    Esophageal reflux    GERD (gastroesophageal reflux disease)    Headache    saw neuro 4-16, MRI done   HYPERPLASIA, PROSTATE NOS W/URINARY OBST/LUTS    IBS    INGUINAL HERNIA    Lichen planus [id number]   Eric Chimes MD   LUMBAR RADICULOPATHY, RIGHT    Mixed hyperlipidemia    OSTEOPOROSIS    PONV (postoperative nausea and vomiting)    PREMATURE VENTRICULAR CONTRACTIONS    a. After CABG - was on Coumadin/Multaq for a period of time. Coumadin discontinued 04/2010 after maintaining NSR.   UNSPECIFIED ANEMIA     Current Medications: Current Meds  Medication Sig   acetaminophen (TYLENOL) 650 MG CR tablet Take 325-650 mg by mouth  every 8 (eight) hours as needed for pain.   apixaban (ELIQUIS) 5 MG TABS tablet Take 1 tablet (5 mg total) by mouth 2 (two) times daily.   aspirin EC 81 MG tablet Take 81 mg by mouth daily.   calcium carbonate (TUMS - DOSED IN MG ELEMENTAL CALCIUM) 500 MG chewable tablet Chew 1 tablet by mouth daily.   Cholecalciferol (VITAMIN D-3) 25 MCG (1000 UT) CAPS Take 1,000 Units by mouth daily with breakfast.   denosumab (PROLIA) 60 MG/ML SOSY injection Inject 60 mg into the skin every 6 (six) months.   famotidine-calcium carbonate-magnesium hydroxide (PEPCID COMPLETE) 10-800-165 MG chewable tablet Chew 1 tablet by mouth daily as needed.   folic acid (FOLVITE) 1 MG tablet TAKE ONE (1) TABLET BY MOUTH EACH DAY   loratadine (CLARITIN) 10 MG tablet Take 1 tablet  (10 mg total) by mouth daily.   Multiple Vitamin (MULITIVITAMIN WITH MINERALS) TABS Take 1 tablet by mouth daily.   nitroGLYCERIN (NITROSTAT) 0.4 MG SL tablet Place 1 tablet (0.4 mg total) under the tongue every 5 (five) minutes x 3 doses as needed for chest pain.   rosuvastatin (CRESTOR) 10 MG tablet TAKE 1 TABLET BY MOUTH DAILY     Allergies:   Amoxicillin, Zoloft [sertraline hcl], and Remeron [mirtazapine]   Social History   Tobacco Use   Smoking status: Former    Types: Cigarettes    Quit date: 11/27/1962    Years since quitting: 58.7   Smokeless tobacco: Never  Vaping Use   Vaping Use: Never used  Substance Use Topics   Alcohol use: Not Currently    Alcohol/week: 0.0 standard drinks    Comment: quit drinking    Drug use: No     Family Hx: The patient's family history includes Breast cancer in [name] sister; Heart disease (age of onset: 76) in [name] sister; Pancreatic cancer in [name] father; Stroke in [name] maternal aunt and mother. There is no history of Eric cancer, Prostate cancer, Esophageal cancer, Rectal cancer, or Stomach cancer.  Review of Systems  Constitutional: Negative for fever.  Respiratory:  Negative for cough.   Gastrointestinal:  Positive for heartburn. Negative for diarrhea, hematemesis, hematochezia and melena.  Genitourinary:  Negative for dysuria and hematuria.    EKGs/Labs/Other Test Reviewed:    EKG:  EKG is ordered today.  The ekg ordered today demonstrates NSR, HR 67, normal axis, incomplete right bundle [name] block, QTC 426, no ST-T wave changes, PVC  Recent Labs: 05/05/2021: ALT 16 07/23/2021: BUN 16; Creatinine, Ser 0.87; Hemoglobin 13.8; Magnesium 2.2; Platelets 160; Potassium 4.7; Sodium 137   Recent Lipid Panel Lab Results  Component Value Date/Time   CHOL 105 05/05/2021 10:45 AM   TRIG 152.0 (H) 05/05/2021 10:45 AM   HDL 56.20 05/05/2021 10:45 AM   LDLCALC 19 05/05/2021 10:45 AM      Risk Assessment/Calculations:    CHA2DS2-VASc Score = 3   This indicates a 3.2% annual risk of stroke. The patient's score is based upon: CHF History: No HTN History: No Diabetes History: No Stroke History: No Vascular Disease History: Yes Age Score: 2 Gender Score: 0        Physical Exam:    VS:  BP 122/74   Pulse 67   Ht '6\' 2"'$  (1.88 m)   Wt 161 lb 3.2 oz (73.1 kg)   SpO2 94%   BMI 20.70 kg/m     Wt Readings from Last 3 Encounters:  07/27/21 161 lb 3.2 oz (73.1 kg)  07/20/21 161 lb (73 kg)  05/05/21 162 lb 2 oz (73.5 kg)     Constitutional:      Appearance: Healthy appearance. Not in distress.  Neck:     Vascular: JVD normal.  Pulmonary:     Effort: Pulmonary effort is normal.     Breath sounds: No wheezing. No rales.  Cardiovascular:     Normal rate. Regular rhythm. Normal S1. Normal S2.      Murmurs: There is no murmur.  Edema:    Peripheral edema absent.  Abdominal:     Palpations: Abdomen is soft. There is no hepatomegaly.     Tenderness: There is no abdominal tenderness.  Skin:    General: Skin is warm and dry.  Neurological:     General: No focal deficit present.     Mental Status: Alert and oriented to person, place and time.     Cranial Nerves: Cranial nerves are intact.        ASSESSMENT & PLAN:    1. Palpitations 2. PVC's (premature ventricular contractions) 3. Precordial chest pain 4. Shortness of breath Overall, [name] symptoms seem to be most consistent with PVCs.  However, he notes indigestion symptoms.  This is unusual for him.  He tells me that he never has indigestion.  It typically occur several hours after eating.  He has not had exertional symptoms.  However, [name] bypass grafts are more than 85 years old.  He has been a little bit short of breath, especially with [name] presenting symptoms to the emergency room.  [name] high-sensitivity troponins were minimally elevated but without significant trend.  This was not consistent with ACS.  He does have a singular PVC on electrocardiogram today.  I think we  need to rule out significant PVC burden as well as ischemia and other arrhythmias.  I considered whether or not to place him on low-dose beta-blocker.  However, he had significant side effects to this in the past with bradycardia.  In the emergency room, [name] electrolytes were normal.  -14-day ZIO AT monitor  -2D echocardiogram  -Lexiscan Myoview  -Famotidine 20 mg twice daily x2 weeks, then as needed  -If cardiac work-up completely unremarkable, consider referral to GI  5. Coronary artery disease involving native coronary artery of native heart without angina pectoris Status post CABG in 2011.  Low risk Myoview in 6/21.  As noted, we will proceed with Lexiscan Myoview to rule out ischemia given [name] current symptoms.  Continue aspirin 81 mg daily, rosuvastatin 10 mg daily.  6. Paroxysmal atrial fibrillation (HCC) No apparent recurrence.  He is tolerating anticoagulation well.  Recent hemoglobin and creatinine normal.  Continue apixaban 5 mg twice daily.  7. Mixed hyperlipidemia Recent LDL optimal.  Continue rosuvastatin 10 mg daily.   Shared Decision Making/Informed Consent The risks [chest pain, shortness of breath, cardiac arrhythmias, dizziness, blood pressure fluctuations, myocardial infarction, stroke/transient ischemic attack, nausea, vomiting, allergic reaction, radiation exposure, metallic taste sensation and life-threatening complications (estimated to be 1 in 10,000)], benefits (risk stratification, diagnosing coronary artery disease, treatment guidance) and alternatives of a nuclear stress test were discussed in detail with [name] and he agrees to proceed.    Dispo:  Return in about 6 weeks (around 09/07/2021) for Follow up after testing w/ Dr. Johnsie [name], or Richardson Dopp, PA-C.   Medication Adjustments/Labs and Tests Ordered: Current medicines are reviewed at length with the patient today.  Concerns regarding medicines are outlined above.  Tests Ordered: Orders Placed This  Encounter  Procedures   Myocardial Perfusion Imaging   EKG 12-Lead   ECHOCARDIOGRAM COMPLETE    Medication Changes: No orders of the defined types were placed in this encounter.   Signed, Richardson Dopp, PA-C  07/27/2021 10:28 AM    [facility] [address], [place], [place]  [address] Phone: [phone]; Fax: [phone]

## 2021-07-27 ENCOUNTER — Other Ambulatory Visit: Payer: Self-pay

## 2021-07-27 ENCOUNTER — Encounter: Payer: Self-pay | Admitting: Cardiology

## 2021-07-27 ENCOUNTER — Ambulatory Visit (INDEPENDENT_AMBULATORY_CARE_PROVIDER_SITE_OTHER): Payer: Medicare Other

## 2021-07-27 ENCOUNTER — Encounter: Payer: Self-pay | Admitting: Physician Assistant

## 2021-07-27 ENCOUNTER — Other Ambulatory Visit: Payer: Self-pay | Admitting: Physician Assistant

## 2021-07-27 ENCOUNTER — Ambulatory Visit: Payer: Medicare Other | Admitting: Physician Assistant

## 2021-07-27 ENCOUNTER — Encounter: Payer: Self-pay | Admitting: *Deleted

## 2021-07-27 VITALS — BP 122/74 | HR 67 | Ht 74.0 in | Wt 161.2 lb

## 2021-07-27 DIAGNOSIS — R0602 Shortness of breath: Secondary | ICD-10-CM

## 2021-07-27 DIAGNOSIS — R079 Chest pain, unspecified: Secondary | ICD-10-CM | POA: Diagnosis not present

## 2021-07-27 DIAGNOSIS — R072 Precordial pain: Secondary | ICD-10-CM | POA: Diagnosis not present

## 2021-07-27 DIAGNOSIS — I2510 Atherosclerotic heart disease of native coronary artery without angina pectoris: Secondary | ICD-10-CM

## 2021-07-27 DIAGNOSIS — E782 Mixed hyperlipidemia: Secondary | ICD-10-CM

## 2021-07-27 DIAGNOSIS — I493 Ventricular premature depolarization: Secondary | ICD-10-CM

## 2021-07-27 DIAGNOSIS — R002 Palpitations: Secondary | ICD-10-CM

## 2021-07-27 DIAGNOSIS — I480 Paroxysmal atrial fibrillation: Secondary | ICD-10-CM

## 2021-07-27 NOTE — Patient Instructions (Signed)
Medication Instructions:   START pepcid one tablet by mouth ( 20 mg) twice daily X 2 weeks than just take as needed.  This is over the counter you do not need a prescription.   *If you need a refill on your cardiac medications before your next appointment, please call your pharmacy*   Lab Work:  -NONE  If you have labs (blood work) drawn today and your tests are completely normal, you will receive your results only by: [place] (if you have MyChart) OR A paper copy in the mail If you have any lab test that is abnormal or we need to change your treatment, we will call you to review the results.   Testing/Procedures: ZIO AT Long term monitor-Live Telemetry  Your physician has requested you wear a ZIO patch monitor for 14 days.  This is a single patch monitor. [employer] supplies one patch monitor per enrollment. Additional  stickers are not available.  Please do not apply patch if you will be having a Nuclear Stress Test, Echocardiogram, Cardiac CT, MRI,  or Chest Xray during the period you would be wearing the monitor. The patch cannot be worn during  these tests. You cannot remove and re-apply the ZIO AT patch monitor.  Your ZIO patch monitor will be mailed 3 day USPS to your address on file. It may take 3-5 days to  receive your monitor after you have been enrolled.  Once you have received your monitor, please review the enclosed instructions. Your monitor has  already been registered assigning a specific monitor serial # to you.   Billing and Patient Assistance Program information  [name] has been supplied with any insurance information on record for billing. [employer] offers a sliding scale Patient Assistance Program for patients without insurance, or whose  insurance does not completely cover the cost of the ZIO patch monitor. You must apply for the  Patient Assistance Program to qualify for the discounted rate. To apply, call [employer] at [phone],  select option 4,  select option 2 , ask to apply for the Patient Assistance Program, (you can request an  interpreter if needed). [employer] will ask your household income and how many people are in your  household. [employer] will quote your out-of-pocket cost based on this information. They will also be able  to set up a 12 month interest free payment plan if needed.  Applying the monitor   Shave hair from upper left chest.  Hold the abrader disc by orange tab. Rub the abrader in 40 strokes over left upper chest as indicated in  your monitor instructions.  Clean area with 4 enclosed alcohol pads. Use all pads to ensure the area is cleaned thoroughly. Let  dry.  Apply patch as indicated in monitor instructions. Patch will be placed under collarbone on left side of  chest with arrow pointing upward.  Rub patch adhesive wings for 2 minutes. Remove the white label marked "1". Remove the white label  marked "2". Rub patch adhesive wings for 2 additional minutes.  While looking in a mirror, press and release button in center of patch. A small green light will flash 3-4  times. This will be your only indicator that the monitor has been turned on.  Do not shower for the first 24 hours. You may shower after the first 24 hours.  Press the button if you feel a symptom. You will hear a small click. Record Date, Time and Symptom in  the Patient Log.   Starting  the Gateway  In your kit there is a small plastic box the size of a cellphone. This is Airline pilot. It transmits all your  recorded data to [facility]. This box must always stay within 10 feet of you. Open the box and push the *  button. There will be a light that blinks orange and then green a few times. When the light stops  blinking, the Gateway is connected to the ZIO patch. Call [employer] at [phone] to confirm your monitor is transmitting.  Returning your monitor  Remove your patch and place it inside the [place]. In the lower half of the Gateway there is a  white  bag with prepaid postage on it. Place Gateway in bag and seal. Mail package back to [place] as soon as  possible. Your physician should have your final report approximately 7 days after you have mailed back  your monitor. Call [place] at [phone] if you have questions regarding your ZIO AT  patch monitor. Call them immediately if you see an orange light blinking on your monitor.  If your monitor falls off in less than 4 days, contact our Monitor department at [phone]. If your  monitor becomes loose or falls off after 4 days call [employer] at [phone] for suggestions on  securing your monitor  You are scheduled for a Myocardial Perfusion Imaging Study on Friday, September 2 at 10:30 am.   Please arrive 15 minutes prior to your appointment time for registration and insurance purposes.   The test will take approximately 3 to 4 hours to complete; you may bring reading material. If someone comes with you to your appointment, they will need to remain in the main lobby due to limited space in the testing area.   How to prepare for your Myocardial Perfusion test:   Do not eat or drink 3 hours prior to your test, except you may have water.    Do not consume products containing caffeine (regular or decaffeinated) 12 hours prior to your test (ex: coffee, chocolate, soda, tea)   Do bring a list of your current medications with you. If not listed below, you may take your medications as normal.   Bring any held medication to your appointment, as you may be required to take it once the test is complete.   Do wear comfortable clothes (no overalls) and walking shoes. Tennis shoes are preferred. No open toed shoes.  Do not wear cologne, aftershave or lotions (deodorant is allowed).   If these instructions are not followed, you test will have to be rescheduled.   Please report to [address] for your test. If you have questions or  concerns about your appointment, please call the Nuclear Lab at [phone].  If you cannot keep your appointment, please provide 24 hour notification to the Nuclear lab to avoid a possible $50 charge to your account.    Your physician has requested that you have an echocardiogram. Echocardiography is a painless test that uses sound waves to create images of your heart. It provides your doctor with information about the size and shape of your heart and how well your heart's chambers and valves are working. This procedure takes approximately one hour. There are no restrictions for this procedure.    Follow-Up: At [facility], you and your health needs are our priority.  As part of our continuing mission to provide you with exceptional heart care, we have created designated Provider Care Teams.  These  Care Teams include your primary Cardiologist (physician) and Advanced Practice Providers (APPs -  Physician Assistants and Nurse Practitioners) who all work together to provide you with the care you need, when you need it.  We recommend signing up for the patient portal called "MyChart".  Sign up information is provided on this After Visit Summary.  MyChart is used to connect with patients for Virtual Visits (Telemedicine).  Patients are able to view lab/test results, encounter notes, upcoming appointments, etc.  Non-urgent messages can be sent to your provider as well.   To learn more about what you can do with MyChart, go to [email or web address].    Your next appointment:   6 week(s)  The format for your next appointment:   In Person  Provider:   Richardson Dopp, PA-C   Other Instructions

## 2021-07-27 NOTE — Progress Notes (Unsigned)
Patient enrolled for [employer] to mail a 14 day ZIO AT monitor to his home.  Letter with instructions mailed to patient.

## 2021-07-28 DIAGNOSIS — H04123 Dry eye syndrome of bilateral lacrimal glands: Secondary | ICD-10-CM | POA: Diagnosis not present

## 2021-07-28 DIAGNOSIS — Z961 Presence of intraocular lens: Secondary | ICD-10-CM | POA: Diagnosis not present

## 2021-07-28 DIAGNOSIS — H26492 Other secondary cataract, left eye: Secondary | ICD-10-CM | POA: Diagnosis not present

## 2021-07-28 NOTE — Addendum Note (Signed)
Addended byKathlen Mody, Nicki Reaper T on: 07/28/2021 03:36 PM   Modules accepted: Orders

## 2021-07-29 ENCOUNTER — Other Ambulatory Visit: Payer: Self-pay

## 2021-07-29 ENCOUNTER — Ambulatory Visit (HOSPITAL_COMMUNITY): Payer: Medicare Other | Attending: Internal Medicine

## 2021-07-29 DIAGNOSIS — R079 Chest pain, unspecified: Secondary | ICD-10-CM

## 2021-07-29 LAB — MYOCARDIAL PERFUSION IMAGING
LV dias vol: 75 mL (ref 62–150)
LV sys vol: 33 mL
Nuc Stress EF: 57 %
Peak HR: 87 {beats}/min
Rest HR: 59 {beats}/min
Rest Nuclear Isotope Dose: 10.2 mCi
SDS: 0
SRS: 0
SSS: 0
ST Depression (mm): 0 mm
Stress Nuclear Isotope Dose: 32.4 mCi
TID: 1.02

## 2021-07-29 MED ORDER — REGADENOSON 0.4 MG/5ML IV SOLN
0.4000 mg | Freq: Once | INTRAVENOUS | Status: AC
Start: 2021-07-29 — End: 2021-07-29
  Administered 2021-07-29: 0.4 mg via INTRAVENOUS

## 2021-07-29 MED ORDER — TECHNETIUM TC 99M TETROFOSMIN IV KIT
32.4000 | PACK | Freq: Once | INTRAVENOUS | Status: AC | PRN
  Administered 2021-07-29: 32.4 via INTRAVENOUS
  Filled 2021-07-29: qty 33

## 2021-07-29 MED ORDER — TECHNETIUM TC 99M TETROFOSMIN IV KIT
10.2000 | PACK | Freq: Once | INTRAVENOUS | Status: AC | PRN
  Administered 2021-07-29: 10.2 via INTRAVENOUS
  Filled 2021-07-29: qty 11

## 2021-07-30 DIAGNOSIS — R002 Palpitations: Secondary | ICD-10-CM | POA: Diagnosis not present

## 2021-07-31 DIAGNOSIS — I480 Paroxysmal atrial fibrillation: Secondary | ICD-10-CM | POA: Diagnosis not present

## 2021-07-31 DIAGNOSIS — I2510 Atherosclerotic heart disease of native coronary artery without angina pectoris: Secondary | ICD-10-CM | POA: Diagnosis not present

## 2021-07-31 DIAGNOSIS — R002 Palpitations: Secondary | ICD-10-CM | POA: Diagnosis not present

## 2021-08-02 ENCOUNTER — Encounter: Payer: Self-pay | Admitting: Physician Assistant

## 2021-08-18 ENCOUNTER — Ambulatory Visit (HOSPITAL_COMMUNITY): Payer: Medicare Other | Attending: Cardiology

## 2021-08-18 ENCOUNTER — Other Ambulatory Visit: Payer: Self-pay

## 2021-08-18 DIAGNOSIS — R079 Chest pain, unspecified: Secondary | ICD-10-CM | POA: Insufficient documentation

## 2021-08-18 DIAGNOSIS — I480 Paroxysmal atrial fibrillation: Secondary | ICD-10-CM | POA: Insufficient documentation

## 2021-08-18 DIAGNOSIS — E782 Mixed hyperlipidemia: Secondary | ICD-10-CM | POA: Diagnosis not present

## 2021-08-18 DIAGNOSIS — R072 Precordial pain: Secondary | ICD-10-CM | POA: Insufficient documentation

## 2021-08-18 DIAGNOSIS — I2510 Atherosclerotic heart disease of native coronary artery without angina pectoris: Secondary | ICD-10-CM | POA: Insufficient documentation

## 2021-08-18 DIAGNOSIS — R0602 Shortness of breath: Secondary | ICD-10-CM | POA: Diagnosis not present

## 2021-08-18 DIAGNOSIS — R002 Palpitations: Secondary | ICD-10-CM | POA: Insufficient documentation

## 2021-08-18 LAB — ECHOCARDIOGRAM COMPLETE
AR max vel: 3.77 cm2
AV Area VTI: 3.57 cm2
AV Area mean vel: 3.6 cm2
AV Mean grad: 5 mmHg
AV Peak grad: 10 mmHg
Ao pk vel: 1.58 m/s
Area-P 1/2: 2.48 cm2
P 1/2 time: 499 msec
S' Lateral: 3.1 cm

## 2021-08-19 ENCOUNTER — Encounter: Payer: Self-pay | Admitting: Physician Assistant

## 2021-08-29 DIAGNOSIS — Z23 Encounter for immunization: Secondary | ICD-10-CM | POA: Diagnosis not present

## 2021-09-05 ENCOUNTER — Other Ambulatory Visit: Payer: Self-pay | Admitting: Cardiovascular Disease

## 2021-09-05 DIAGNOSIS — I480 Paroxysmal atrial fibrillation: Secondary | ICD-10-CM

## 2021-09-05 NOTE — Telephone Encounter (Signed)
Prescription refill request for Eliquis received. Indication:Afib  Last office visit: 07/27/21 Kathlen Mody)  Scr: 0.87 (07/23/21) Age: 87 Weight: 73kg  Appropriate dose and refill sent to requested pharmacy.

## 2021-09-06 NOTE — Progress Notes (Signed)
Cardiology Office Note:    Date:  09/07/2021   ID:  [name], DOB 12-29-1929, MRN [id number]  PCP:  Colon Branch, MD   [facility] Providers Cardiologist:  Jenkins Rouge, MD    Referring MD: Colon Branch, MD   Chief Complaint:  F/u for palpitations    Patient Profile:   [name] is a 87 y.o. male with:  Coronary artery disease  S/p CABG in 2011 Myoview 6/21: Low risk Paroxysmal atrial fibrillation Dronedarone DC'd secondary to GI side effects Not on beta-blocker due to bradycardia Anticoagulation: Apixaban Hyperlipidemia Osteoporosis Elevated PSA GERD     Prior CV studies: GATED SPECT MYO PERF W/LEXISCAN STRESS 1D 07/29/2021 Normal perfusion. LVEF 57% with normal wall motion. This is a low risk study. Compared to a prior study in 2021, there is no change.   CARDIAC TELEMETRY MONITORING-INTERPRETATION ONLY 08/23/2021 Summary:  NSR average HR 68 bpm Atrial tachycardia longest only 13 beats PAC/PVCls < 1% total beats No significant arrhythmias   ECHO COMPLETE WO IMAGING ENHANCING AGENT 08/18/2021 EF 60-65, no RWMA, GLS -24.9, normal RVSF, moderate BAE, trivial MR, moderate MAC, mild AS (mean 5), mild AI, mild dilation of ascending aorta (40 mm), RVSP 20.6  GATED SPECT MYO PERF W/LEXISCAN STRESS 1D 05/21/2020 Normal stress nuclear study with no ischemia or infarction.  Gated ejection fraction 57% with normal wall motion.   Echocardiogram 08/30/2015 EF 55-60, no RWMA, GR 1 DD, trivial AI, mild RAE    History of Present Illness: [name] was last seen 07/27/21 after a trip to the ED with palpitations.  He also noted chest pain, shortness of breath.  An echocardiogram was obtained and demonstrated normal EF, mild AS, mild dilation of aorta.  A Myoview was obtained and was normal.   An event monitor showed a brief episode of atrial tachycardia but no significant arrhythmias.  He returns for f/u.  He is here alone.  He did have 1 episode of palpitations after he put the  event monitor on.  He has not had any further palpitations.  He has not had chest pain, shortness of breath, syncope    Past Medical History:  Diagnosis Date   Anxiety    Atrial fibrillation ([place])    Blood transfusion without reported diagnosis    CAD    a. 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 // Myoview 9/22: EF 57, normal perfusion; low risk   Cataract    Chronic abdominal pain    Chronic nausea    Coronary artery disease    Diverticulosis 2011   Diverticulitis 2004   Dyspepsia    Esophageal reflux    GERD (gastroesophageal reflux disease)    Headache    saw neuro 4-16, MRI done   History of echocardiogram    Echo 9/22: EF 60-65, no RWMA, normal diastolic function, GLS -29.9, normal RVSF, normal PASP, mild-moderate BAE, trivial MR, moderate MAC, moderate AV calcification, mild AI, mild aortic stenosis (mean gradient 5 mmHg, V-max 158 cm/s, DI 0.62), mild dilation of ascending aorta (40 mm)   HYPERPLASIA, PROSTATE NOS W/URINARY OBST/LUTS    IBS    INGUINAL HERNIA    Lichen planus [id number]   [name]   LUMBAR RADICULOPATHY, RIGHT    Mixed hyperlipidemia    OSTEOPOROSIS    PONV (postoperative nausea and vomiting)    PREMATURE VENTRICULAR CONTRACTIONS    a.  After CABG - was on Coumadin/Multaq for a period of time. Coumadin discontinued 04/2010 after maintaining NSR.   UNSPECIFIED ANEMIA    Current Medications: Current Meds  Medication Sig   acetaminophen (TYLENOL) 650 MG CR tablet Take 325-650 mg by mouth every 8 (eight) hours as needed for pain.   aspirin EC 81 MG tablet Take 81 mg by mouth daily.   calcium carbonate (TUMS - DOSED IN MG ELEMENTAL CALCIUM) 500 MG chewable tablet Chew 1 tablet by mouth daily.   Cholecalciferol (VITAMIN D-3) 25 MCG (1000 UT) CAPS Take 1,000 Units by mouth daily with breakfast.   denosumab (PROLIA) 60 MG/ML SOSY injection Inject 60 mg into the skin every 6 (six)  months.   ELIQUIS 5 MG TABS tablet TAKE ONE (1) TABLET BY MOUTH TWO (2) TIMES DAILY   famotidine-calcium carbonate-magnesium hydroxide (PEPCID COMPLETE) 10-800-165 MG chewable tablet Chew 1 tablet by mouth daily as needed.   folic acid (FOLVITE) 1 MG tablet TAKE ONE (1) TABLET BY MOUTH EACH DAY   loratadine (CLARITIN) 10 MG tablet Take 10 mg by mouth daily as needed for allergies.   Multiple Vitamin (MULITIVITAMIN WITH MINERALS) TABS Take 1 tablet by mouth daily.   nitroGLYCERIN (NITROSTAT) 0.4 MG SL tablet Place 1 tablet (0.4 mg total) under the tongue every 5 (five) minutes x 3 doses as needed for chest pain.   rosuvastatin (CRESTOR) 10 MG tablet TAKE 1 TABLET BY MOUTH DAILY    Allergies:   Amoxicillin, Zoloft [sertraline hcl], and Remeron [mirtazapine]   Social History   Tobacco Use   Smoking status: Former    Types: Cigarettes    Quit date: 11/27/1962    Years since quitting: 58.8   Smokeless tobacco: Never  Vaping Use   Vaping Use: Never used  Substance Use Topics   Alcohol use: Not Currently    Alcohol/week: 0.0 standard drinks    Comment: quit drinking    Drug use: No    Family Hx: The patient's family history includes Breast cancer in his sister; Heart disease (age of onset: 65) in his sister; Pancreatic cancer in his father; Stroke in his maternal aunt and mother. There is no history of Colon cancer, Prostate cancer, Esophageal cancer, Rectal cancer, or Stomach cancer.  ROS   EKGs/Labs/Other Test Reviewed:    EKG:  EKG is not ordered today.  The ekg ordered today demonstrates n/a  Recent Labs: 05/05/2021: ALT 16 07/23/2021: BUN 16; Creatinine, Ser 0.87; Hemoglobin 13.8; Magnesium 2.2; Platelets 160; Potassium 4.7; Sodium 137   Recent Lipid Panel Lab Results  Component Value Date/Time   CHOL 105 05/05/2021 10:45 AM   TRIG 152.0 (H) 05/05/2021 10:45 AM   HDL 56.20 05/05/2021 10:45 AM   LDLCALC 19 05/05/2021 10:45 AM     Risk Assessment/Calculations:     CHA2DS2-VASc Score = 3   This indicates a 3.2% annual risk of stroke. The patient's score is based upon: CHF History: 0 HTN History: 0 Diabetes History: 0 Stroke History: 0 Vascular Disease History: 1 Age Score: 2 Gender Score: 0         Physical Exam:    VS:  BP 100/60   Pulse 60   Ht _0  (1.88 m)   Wt 160 lb (72.6 kg)   SpO2 97%   BMI 20.54 kg/m     Wt Readings from Last 3 Encounters:  09/07/21 160 lb (72.6 kg)  07/29/21 161 lb (73 kg)  07/27/21 161 lb 3.2 oz (73.1 kg)  Constitutional:      Appearance: Healthy appearance. Not in distress.  Neck:     Vascular: JVD normal.  Pulmonary:     Effort: Pulmonary effort is normal.     Breath sounds: No wheezing. No rales.  Cardiovascular:     Normal rate. Regular rhythm. Normal S1. Normal S2.      Murmurs: There is a grade 1/6 systolic murmur at the URSB.  Edema:    Peripheral edema absent.  Abdominal:     Palpations: Abdomen is soft.  Skin:    General: Skin is warm and dry.  Neurological:     General: No focal deficit present.     Mental Status: Alert and oriented to person, place and time.     Cranial Nerves: Cranial nerves are intact.       ASSESSMENT & PLAN:   1. Palpitations Recent event monitor did demonstrate 1 brief episode of atrial tachycardia.  He did have symptoms with this.  He had a very low burden of PVCs and no other significant arrhythmias.  He has a history of bradycardia and beta-blocker therapy was stopped in the past.  He has not had any further palpitations since he was last seen.  If he has more more palpitations, we can certainly send in propranolol 10 mg to take once daily as needed.  He knows to contact us if he has an increased burden of palpitations.  2. Coronary artery disease involving native coronary artery of native heart without angina pectoris History of CABG in 2011.  Recent Myoview low risk.  He is not having angina.  He remains on rosuvastatin.  3. Paroxysmal atrial  fibrillation (HCC) Recent hemoglobin, creatinine normal.  He remains on apixaban 5 mg twice daily.  4. Dilated aorta Ascending aorta 40 mm on recent echocardiogram.  Repeat echocardiogram could be considered in the next 1 to 2 years to reassess this.           Dispo:  Return in about 6 months (around 03/08/2022) for Routine Follow Up w/ Dr. Johnsie Cancel.   Medication Adjustments/Labs and Tests Ordered: Current medicines are reviewed at length with the patient today.  Concerns regarding medicines are outlined above.  Tests Ordered: No orders of the defined types were placed in this encounter.  Medication Changes: No orders of the defined types were placed in this encounter.  Signed, Richardson Dopp, PA-C  09/07/2021 10:52 AM    [facility] [address], [place], [place]  [address] Phone: [phone]; Fax: [phone]

## 2021-09-07 ENCOUNTER — Encounter: Payer: Self-pay | Admitting: Physician Assistant

## 2021-09-07 ENCOUNTER — Ambulatory Visit: Payer: Medicare Other | Admitting: Physician Assistant

## 2021-09-07 ENCOUNTER — Other Ambulatory Visit: Payer: Self-pay

## 2021-09-07 VITALS — BP 100/60 | HR 60 | Ht 74.0 in | Wt 160.0 lb

## 2021-09-07 DIAGNOSIS — R002 Palpitations: Secondary | ICD-10-CM | POA: Diagnosis not present

## 2021-09-07 DIAGNOSIS — I2510 Atherosclerotic heart disease of native coronary artery without angina pectoris: Secondary | ICD-10-CM

## 2021-09-07 DIAGNOSIS — I77810 Thoracic aortic ectasia: Secondary | ICD-10-CM | POA: Diagnosis not present

## 2021-09-07 DIAGNOSIS — E782 Mixed hyperlipidemia: Secondary | ICD-10-CM

## 2021-09-07 DIAGNOSIS — I480 Paroxysmal atrial fibrillation: Secondary | ICD-10-CM

## 2021-09-07 NOTE — Patient Instructions (Signed)
Medication Instructions:   Your physician recommends that you continue on your current medications as directed. Please refer to the Current Medication list given to you today.  *If you need a refill on your cardiac medications before your next appointment, please call your pharmacy*   Lab Work:  -NONE  If you have labs (blood work) drawn today and your tests are completely normal, you will receive your results only by: [place] (if you have MyChart) OR A paper copy in the mail If you have any lab test that is abnormal or we need to change your treatment, we will call you to review the results.   Testing/Procedures:  -NONE   Follow-Up: At [facility], you and your health needs are our priority.  As part of our continuing mission to provide you with exceptional heart care, we have created designated Provider Care Teams.  These Care Teams include your primary Cardiologist (physician) and Advanced Practice Providers (APPs -  Physician Assistants and Nurse Practitioners) who all work together to provide you with the care you need, when you need it.  We recommend signing up for the patient portal called "MyChart".  Sign up information is provided on this After Visit Summary.  MyChart is used to connect with patients for Virtual Visits (Telemedicine).  Patients are able to view lab/test results, encounter notes, upcoming appointments, etc.  Non-urgent messages can be sent to your provider as well.   To learn more about what you can do with MyChart, go to [email or web address].    Your next appointment:   6 month(s)  The format for your next appointment:   In Person  Provider:   Jenkins Rouge, MD   Other Instructions

## 2021-10-06 NOTE — Telephone Encounter (Signed)
Prior Auth required for Prolia  PA PROCESS DETAILS: Please complete the prior authorization form located at [email or web address] or call [phone].

## 2021-10-10 NOTE — Telephone Encounter (Signed)
Pt ready for scheduling on or after 11/10/21  Out-of-pocket cost due at time of visit: $30  Primary:  [employer] Medicare Prolia co-insurance: 0% Admin fee co-insurance: $30   Secondary: n/a Prolia co-insurance:  Admin fee co-insurance:   Deductible: does not apply  Prior Auth: APPROVED PA# [id number] Valid: 10/10/21-10/10/22    ** This summary of benefits is an estimation of the patient's out-of-pocket cost. Exact cost may very based on individual plan coverage.

## 2021-10-18 ENCOUNTER — Encounter: Payer: Medicare Other | Admitting: Internal Medicine

## 2021-10-19 ENCOUNTER — Other Ambulatory Visit: Payer: Self-pay

## 2021-10-19 ENCOUNTER — Encounter: Payer: Self-pay | Admitting: Internal Medicine

## 2021-10-19 ENCOUNTER — Ambulatory Visit (INDEPENDENT_AMBULATORY_CARE_PROVIDER_SITE_OTHER): Payer: Medicare Other | Admitting: Internal Medicine

## 2021-10-19 VITALS — BP 136/72 | HR 64 | Temp 98.1°F | Resp 18 | Ht 74.0 in | Wt 162.2 lb

## 2021-10-19 DIAGNOSIS — R6889 Other general symptoms and signs: Secondary | ICD-10-CM

## 2021-10-19 DIAGNOSIS — R269 Unspecified abnormalities of gait and mobility: Secondary | ICD-10-CM

## 2021-10-19 DIAGNOSIS — Z0000 Encounter for general adult medical examination without abnormal findings: Secondary | ICD-10-CM | POA: Diagnosis not present

## 2021-10-19 DIAGNOSIS — E782 Mixed hyperlipidemia: Secondary | ICD-10-CM | POA: Diagnosis not present

## 2021-10-19 DIAGNOSIS — R739 Hyperglycemia, unspecified: Secondary | ICD-10-CM

## 2021-10-19 DIAGNOSIS — I480 Paroxysmal atrial fibrillation: Secondary | ICD-10-CM | POA: Diagnosis not present

## 2021-10-19 LAB — BASIC METABOLIC PANEL
BUN: 20 mg/dL (ref 6–23)
CO2: 31 mEq/L (ref 19–32)
Calcium: 9.5 mg/dL (ref 8.4–10.5)
Chloride: 102 mEq/L (ref 96–112)
Creatinine, Ser: 1.11 mg/dL (ref 0.40–1.50)
GFR: 59.06 mL/min — ABNORMAL LOW (ref >60.00)
Glucose, Bld: 100 mg/dL — ABNORMAL HIGH (ref 70–99)
Potassium: 5.3 mEq/L — ABNORMAL HIGH (ref 3.5–5.1)
Sodium: 137 mEq/L (ref 135–145)

## 2021-10-19 LAB — CBC WITH DIFFERENTIAL/PLATELET
Basophils Absolute: 0.1 10*3/uL (ref 0.0–0.1)
Basophils Relative: 1 % (ref 0.0–3.0)
Eosinophils Absolute: 0.5 10*3/uL (ref 0.0–0.7)
Eosinophils Relative: 5.9 % — ABNORMAL HIGH (ref 0.0–5.0)
HCT: 41.1 % (ref 39.0–52.0)
Hemoglobin: 13.3 g/dL (ref 13.0–17.0)
Lymphocytes Relative: 21.9 % (ref 12.0–46.0)
Lymphs Abs: 1.8 10*3/uL (ref 0.7–4.0)
MCHC: 32.5 g/dL (ref 30.0–36.0)
MCV: 95.3 fl (ref 78.0–100.0)
Monocytes Absolute: 0.7 10*3/uL (ref 0.1–1.0)
Monocytes Relative: 8.1 % (ref 3.0–12.0)
Neutro Abs: 5.1 10*3/uL (ref 1.4–7.7)
Neutrophils Relative %: 63.1 % (ref 43.0–77.0)
Platelets: 156 10*3/uL (ref 150.0–400.0)
RBC: 4.31 Mil/uL (ref 4.22–5.81)
RDW: 16.1 % — ABNORMAL HIGH (ref 11.5–15.5)
WBC: 8 10*3/uL (ref 4.0–10.5)

## 2021-10-19 LAB — TSH: TSH: 3.31 u[IU]/mL (ref 0.35–5.50)

## 2021-10-19 LAB — HEMOGLOBIN A1C: Hgb A1c MFr Bld: 6 % (ref 4.6–6.5)

## 2021-10-19 NOTE — Patient Instructions (Addendum)
Proceed with a COVID booster and a tetanus shot: Tdap  If you have a healthcare power of attorney, is a good idea to provide [place] a copy.  GO TO THE LAB : Get the blood work     [place], [place] back for   a checkup in 6 months.

## 2021-10-19 NOTE — Progress Notes (Signed)
Subjective:    Patient ID: [name], male    DOB: 1930-07-29, 87 y.o.   MRN: [id number]  DOS:  10/19/2021 Type of visit - description: cpx  Since the last office visit saw cardiology, notes reviewed. In general feels well. States that he feels slightly unsteady, also stated  is not really dizziness,  just feels that he "could fall" . No actual accidents or falls.  Review of Systems  denies chest pain, difficulty breathing.  No stroke symptoms such as diplopia, face numbness or focal deficits. Has some difficulty urinating at night.  During the daytime he is fine.  Denies dysuria or gross hematuria.   Past Medical History:  Diagnosis Date   Anxiety    Atrial fibrillation ([place])    Blood transfusion without reported diagnosis    CAD    a. 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 // Myoview 9/22: EF 57, normal perfusion; low risk   Cataract    Chronic abdominal pain    Chronic nausea    Coronary artery disease    Diverticulosis 2011   Diverticulitis 2004   Dyspepsia    Esophageal reflux    GERD (gastroesophageal reflux disease)    Headache    saw neuro 4-16, MRI done   History of echocardiogram    Echo 9/22: EF 60-65, no RWMA, normal diastolic function, GLS -65.4, normal RVSF, normal PASP, mild-moderate BAE, trivial MR, moderate MAC, moderate AV calcification, mild AI, mild aortic stenosis (mean gradient 5 mmHg, V-max 158 cm/s, DI 0.62), mild dilation of ascending aorta (40 mm)   HYPERPLASIA, PROSTATE NOS W/URINARY OBST/LUTS    IBS    INGUINAL HERNIA    Lichen planus [id number]   [name]   LUMBAR RADICULOPATHY, RIGHT    Mixed hyperlipidemia    OSTEOPOROSIS    PONV (postoperative nausea and vomiting)    PREMATURE VENTRICULAR CONTRACTIONS    a. After CABG - was on Coumadin/Multaq for a period of time. Coumadin discontinued 04/2010 after maintaining NSR.   UNSPECIFIED ANEMIA     Past Surgical  History:  Procedure Laterality Date   BIOPSY  09/10/2018   Procedure: BIOPSY;  Surgeon: Jerene Bears, MD;  Location: [facility];  Service: Gastroenterology;;   CATARACT EXTRACTION Right 10/06/2013   CORONARY ARTERY BYPASS GRAFT     2 VD;anomalous RCA;post op compliacted by AF   ESOPHAGOGASTRODUODENOSCOPY (EGD) WITH PROPOFOL N/A 09/10/2018   Procedure: ESOPHAGOGASTRODUODENOSCOPY (EGD) WITH PROPOFOL;  Surgeon: Jerene Bears, MD;  Location: [facility];  Service: Gastroenterology;  Laterality: N/A;   HIP FRACTURE SURGERY Right 03/2007   Dr. Salvadore Farber   INGUINAL HERNIA REPAIR Left 2012   Dr Brantley Stage   KNEE SURGERY Right 1977   repair   [place] BIOPSY  09/19/2013   [facility]    Allergies as of 10/19/2021       Reactions   Amoxicillin Diarrhea, Nausea And Vomiting, Other (See Comments)   Patient questions this   Zoloft [sertraline Hcl]    Patient didn't like the way it made him feel   Remeron [mirtazapine] Other (See Comments)   Caused excessive drowsiness        Medication List        Accurate as of October 19, 2021 11:59 PM. If you have any questions, ask your nurse or doctor.          acetaminophen 650 MG CR tablet  Commonly known as: TYLENOL Take 325-650 mg by mouth every 8 (eight) hours as needed for pain.   aspirin EC 81 MG tablet Take 81 mg by mouth daily.   calcium carbonate 500 MG chewable tablet Commonly known as: TUMS - dosed in mg elemental calcium Chew 1 tablet by mouth daily.   denosumab 60 MG/ML Sosy injection Commonly known as: PROLIA Inject 60 mg into the skin every 6 (six) months.   Eliquis 5 MG Tabs tablet Generic drug: apixaban TAKE ONE (1) TABLET BY MOUTH TWO (2) TIMES DAILY   famotidine-calcium carbonate-magnesium hydroxide 10-800-165 MG chewable tablet Commonly known as: PEPCID COMPLETE Chew 1 tablet by mouth daily as needed.   folic acid 1 MG tablet Commonly known as: FOLVITE TAKE ONE (1) TABLET BY MOUTH EACH DAY    loratadine 10 MG tablet Commonly known as: CLARITIN Take 10 mg by mouth daily as needed for allergies.   multivitamin with minerals Tabs tablet Take 1 tablet by mouth daily.   nitroGLYCERIN 0.4 MG SL tablet Commonly known as: NITROSTAT Place 1 tablet (0.4 mg total) under the tongue every 5 (five) minutes x 3 doses as needed for chest pain.   rosuvastatin 10 MG tablet Commonly known as: CRESTOR TAKE 1 TABLET BY MOUTH DAILY   Vitamin D-3 25 MCG (1000 UT) Caps Take 1,000 Units by mouth daily with breakfast.           Objective:   Physical Exam BP 136/72 (BP Location: Left Arm, Patient Position: Sitting, Cuff Size: Small)   Pulse 64   Temp 98.1 F (36.7 C) (Oral)   Resp 18   Ht _0  (1.88 m)   Wt 162 lb 4 oz (73.6 kg)   BMI 20.83 kg/m  General: Well developed, NAD, BMI noted Neck: No  thyromegaly  HEENT:  Normocephalic . Face symmetric, atraumatic Lungs:  CTA B Normal respiratory effort, no intercostal retractions, no accessory muscle use. Heart: RRR, trace systolic murmur ?  Abdomen:  Not distended, soft, non-tender. No rebound or rigidity.   Lower extremities: no pretibial edema bilaterally  Skin: Exposed areas without rash. Not pale. Not jaundice Neurologic:  alert & oriented X3.  Speech normal, gait appropriate for age and unassisted Strength symmetric and appropriate for age.  Psych: Cognition and judgment appear intact.  Cooperative with normal attention span and concentration.  Behavior appropriate. No anxious or depressed appearing.     Assessment    Assessment Prediabetes, A1c 6.0 (2015) Hyperlipidemia Anxiety, depression:  -intol to zoloft, Rx lexapro 5 mg 07-2015: intolerant d/t nausea  -lost wife July 2017 CV: ---CAD--CABG 2011 ----P- Atrial fibrillation, PVCs.  Elliquis ; Multaq d/c d/t GI s/e ----Palpable Ao x years, [place] (-) for AAA 2005 COPD (per CXR 06-2015), PFTs 10-04-15 mild obstruction DJD  Gait-- uses a walker prn GI: --GERD,  IBS, diverticulosis --Chronic nausea: 2019.  Work-up: 08-2018: Negative EGD, CT  and US abdomen. 12/2018 wnl Gastric empty stomach.  Symptoms decreased after Multaq stopped GU: elevated PSA, BPH , (-) bx 2014 Osteoporosis: -hip FX 2008 - dexa ~2005 --> rx fosamax, took x 1 year, dexa ~2010 was rec no fosamax at that time. Dexa 09-2015: T score -2.4 --> rx fosamax, d/c d/t jaw pain 06-2017; took prolia #25 February 2018; T score (-) 30 December 4032, per Endo Lichen planus: Well-controlled per last FLP C Headache -- saw neurology 02-2015, had a MRI/MRA (-) COPD: PFTs 08-2020: Mild obstruction with some air trapping.  Insignificant response to bronchodilators.  PLAN Here for CPX Prediabetes: Check A1c Hyperlipidemia, CAD, paroxysmal A. fib, dilated aorta: Saw cardiology 09/07/2021 for palpitations, note reviewed, had a  very low burden of PVCs on his event monitor. They rec observation and consider propanolol as needed palpitations.  No chest pain. BPH:   mild nocturia,  not bothersome to patient, at baseline.  Last PSA satisfactory.  No change.  In the past was prescribed tamsulosin but does not like to take more medicines. Osteoporosis: Will be due for Prolia in few weeks. Cold intolerance: Reports cold intolerance in the afternoon, check a TSH. Gait disorder: Feels somewhat unsteady, no recent falls, recommend the use of a cane, offered PT. Trace syst murmur: see physical exam, observation RTC 6 months  In addition to CPX, I addressed his chronic medical problems, reviewed the chart, we have a long conversation about his gait disorder.   This visit occurred during the SARS-CoV-2 public health emergency.  Safety protocols were in place, including screening questions prior to the visit, additional usage of staff PPE, and extensive cleaning of exam room while observing appropriate contact time as indicated for disinfecting solutions.

## 2021-10-21 NOTE — Assessment & Plan Note (Addendum)
Assessment Prediabetes, A1c 6.0 (2015) Hyperlipidemia Anxiety, depression:  -intol to zoloft, Rx lexapro 5 mg 07-2015: intolerant d/t nausea  -lost wife July 2017 CV: ---CAD--CABG 2011 ----P- Atrial fibrillation, PVCs.  Elliquis ; Multaq d/c d/t GI s/e ----Palpable Ao x years, [place] (-) for AAA 2005 COPD (per CXR 06-2015), PFTs 10-04-15 mild obstruction DJD  Gait-- uses a walker prn GI: --GERD, IBS, diverticulosis --Chronic nausea: 2019.  Work-up: 08-2018: Negative EGD, CT  and US abdomen. 12/2018 wnl Gastric empty stomach.  Symptoms decreased after Multaq stopped GU: elevated PSA, BPH , (-) bx 2014 Osteoporosis: -hip FX 2008 - dexa ~2005 --> rx fosamax, took x 1 year, dexa ~2010 was rec no fosamax at that time. Dexa 09-2015: T score -2.4 --> rx fosamax, d/c d/t jaw pain 06-2017; took prolia #25 February 2018; T score (-) 31 December 979, per Endo Lichen planus: Well-controlled per last FLP C Headache -- saw neurology 02-2015, had a MRI/MRA (-) COPD: PFTs 08-2020: Mild obstruction with some air trapping.  Insignificant response to bronchodilators.   PLAN Here for CPX Prediabetes: Check A1c Hyperlipidemia, CAD, paroxysmal A. fib, dilated aorta: Saw cardiology 09/07/2021 for palpitations, note reviewed, had a  very low burden of PVCs on his event monitor. They rec observation and consider propanolol as needed palpitations.  No chest pain. BPH:   mild nocturia,  not bothersome to patient, at baseline.  Last PSA satisfactory.  No change.  In the past was prescribed tamsulosin but does not like to take more medicines. Osteoporosis: Will be due for Prolia in few weeks. Cold intolerance: Reports cold intolerance in the afternoon, check a TSH. Gait disorder: Feels somewhat unsteady, no recent falls, recommend the use of a cane, offered PT. Trace syst murmur: see physical exam, observation RTC 6 months

## 2021-10-21 NOTE — Assessment & Plan Note (Signed)
-  Tdap 2012, tdap rec  -PNA-- 05/17/15; prevnar 2016 -Last   Covid shots 07/2021, rec bivalent booster at his convenience - s/p shingrix x2 - had a flu shot -CCS, prostate ca screening: : no further screenings   -Prostate cancer screening: See comments under BPH - diet -exercise discussed - has a health care power of attorney, recommend to provide a copy -Labs: BMP, A1c, TSH

## 2021-10-31 ENCOUNTER — Ambulatory Visit (INDEPENDENT_AMBULATORY_CARE_PROVIDER_SITE_OTHER): Payer: Medicare Other | Admitting: Pharmacist

## 2021-10-31 DIAGNOSIS — R739 Hyperglycemia, unspecified: Secondary | ICD-10-CM

## 2021-10-31 DIAGNOSIS — I25810 Atherosclerosis of coronary artery bypass graft(s) without angina pectoris: Secondary | ICD-10-CM

## 2021-10-31 DIAGNOSIS — E782 Mixed hyperlipidemia: Secondary | ICD-10-CM

## 2021-10-31 DIAGNOSIS — M810 Age-related osteoporosis without current pathological fracture: Secondary | ICD-10-CM

## 2021-10-31 DIAGNOSIS — I480 Paroxysmal atrial fibrillation: Secondary | ICD-10-CM

## 2021-10-31 DIAGNOSIS — E875 Hyperkalemia: Secondary | ICD-10-CM

## 2021-10-31 NOTE — Chronic Care Management (AMB) (Signed)
Chronic Care Management Pharmacy Note  10/31/2021 Name:  [name] MRN:  [id number] DOB:  [birth year]  Summary:  Patient is doing well; potassium was a little elevated when checked 10/19/2021.   Recommendations/Changes made from today's visit: Reviewed high potassium food to limit - potatoes, bananas, avocados and nuts.  Advised trial of holding multivitamin to see if potassium remains in normal range.   Subjective: [name] is an 87 y.o. year old male who is a primary patient of Paz, Alda Berthold, MD.  The CCM team was consulted for assistance with disease management and care coordination needs.    Engaged with patient by telephone for follow up visit in response to provider referral for pharmacy case management and/or care coordination services.   Consent to Services:  The patient was given information about Chronic Care Management services, agreed to services, and gave verbal consent prior to initiation of services.  Please see initial visit note for detailed documentation.   Patient Care Team: Colon Branch, MD as PCP - General (Internal Medicine) Josue Hector, MD as PCP - Cardiology (Cardiology) Bjorn Loser, MD as Consulting Physician (Urology) Josue Hector, MD as Consulting Physician (Cardiology) Sydnee Levans, MD as Consulting Physician (Dermatology) Deneise Lever, MD as Consulting Physician (Pulmonary Disease) Alda Berthold, DO as Consulting Physician (Neurology) Inda Castle, MD (Inactive) as Consulting Physician (Gastroenterology) Clent Jacks, MD as Consulting Physician (Ophthalmology) Cherre Robins, [place] (Pharmacist)  Recent office visits: 10/19/2021 - Int Med (Dr Larose Kells) annual physical. Noted gait discorder. Use cane and PT. 05/05/2021 - Int Med (Dr Larose Kells) General follow up. No med changes.   Recent consult visits: 09/07/2021 - Cardio Kathlen Mody, [facility]) F/U palpitations. Reviewed recent testing. Low PVC burden. ECHO - EF 60-65%. Stress test = low  risk. No med changes.  07/27/2021 - Cardio ([place], [facility]) F/U palpitations / afib. Noted PVC on EKG in office. Placed 14 day Zio AT onitor. Ordered 2D ECHO and stress test. Prescribed famotidine 75m twice a day for 2 weeks, then use as needed.  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 Basal ell carcinoma of scalp and neck.  06/06/2021 - Derm (Dr SFontaine No seen for basal cell carcinoma on scalp and neck.  05/10/2021 - Endo -(Dr GCruzita Lederer  Received Prolia injeciton 03/28/2021 Cardio (Dr NJohnsie Cancel Cardio annual follow up. No med changes. Noted BMP showed slightly elevated potassium 5.3 11/29/2020 - Endo (Dr GCruzita Lederer osteoporosis f/u; Diagnosed in 2008; h/o vertebral fracture; restarted Prolia in 2021. Repeat DEXA ordered. No medication changes.   [facility]: [facility]ed for 7 hours. Not new palpitations or arrythmias. Released home with no med changes Told to f/u with cardio.  Objective:  Lab Results  Component Value Date   CREATININE 1.11 10/19/2021   CREATININE 0.87 07/23/2021   CREATININE 0.82 03/28/2021    Lab Results  Component Value Date   HGBA1C 6.0 10/19/2021   Last diabetic Eye exam: No results found for: HMDIABEYEEXA  Last diabetic Foot exam: No results found for: HMDIABFOOTEX      Component Value Date/Time   CHOL 105 05/05/2021 1045   TRIG 152.0 (H) 05/05/2021 1045   HDL 56.20 05/05/2021 1045   CHOLHDL 2 05/05/2021 1045   VLDL 30.4 05/05/2021 1045   LDLCALC 19 05/05/2021 1045    Hepatic Function Latest Ref Rng & Units 05/05/2021 03/21/2020 05/27/2019  Total Protein 6.5 -  8.1 g/dL - 7.2 6.7  Albumin 3.5 - 5.0 g/dL - 4.1 4.2  AST 0 - 37 U/L _0 ALT 0 - 53 U/L _1 Alk Phosphatase 38 - 126 U/L - 50 50  Total Bilirubin 0.3 - 1.2 mg/dL - 0.4 0.4  Bilirubin, Direct 0.0 - 0.3 mg/dL - - -    Lab Results  Component Value Date/Time   TSH  3.31 10/19/2021 01:31 PM   TSH 2.44 08/14/2018 01:28 PM   FREET4 1.21 01/08/2014 02:48 PM   FREET4 0.79 06/12/2011 11:03 AM    CBC Latest Ref Rng & Units 10/19/2021 07/23/2021 03/28/2021  WBC 4.0 - 10.5 K/uL 8.0 8.3 8.9  Hemoglobin 13.0 - 17.0 g/dL 13.3 13.8 13.3  Hematocrit 39.0 - 52.0 % 41.1 42.4 41.1  Platelets 150.0 - 400.0 K/uL 156.0 160 189    Lab Results  Component Value Date/Time   VD25OH 36.3 12/09/2020 10:54 AM   VD25OH 46.37 12/09/2019 12:08 PM   VD25OH 50.45 12/09/2018 02:22 PM    Clinical ASCVD: Yes  The ASCVD Risk score (Arnett DK, et al., 2019) failed to calculate for the following reasons:   The 2019 ASCVD risk score is only valid for ages 32 to 7     Social History   Tobacco Use  Smoking Status Former   Types: Cigarettes   Quit date: 11/27/1962   Years since quitting: 58.9  Smokeless Tobacco Never   BP Readings from Last 3 Encounters:  10/19/21 136/72  09/07/21 100/60  07/27/21 122/74   Pulse Readings from Last 3 Encounters:  10/19/21 64  09/07/21 60  07/27/21 67   Wt Readings from Last 3 Encounters:  10/19/21 162 lb 4 oz (73.6 kg)  09/07/21 160 lb (72.6 kg)  07/29/21 161 lb (73 kg)    Assessment: Review of patient past medical history, allergies, medications, health status, including review of consultants reports, laboratory and other test data, was performed as part of comprehensive evaluation and provision of chronic care management services.   SDOH:  (Social Determinants of Health) assessments and interventions performed:  SDOH Interventions    Flowsheet Row Most Recent Value  SDOH Interventions   Financial Strain Interventions Intervention Not Indicated       CCM Care Plan  Allergies  Allergen Reactions   Amoxicillin Diarrhea, Nausea And Vomiting and Other (See Comments)    Patient questions this   Zoloft [Sertraline Hcl]     Patient didn't like the way it made him feel   Remeron [Mirtazapine] Other (See Comments)    Caused  excessive drowsiness    Medications Reviewed Today     Reviewed by Cherre Robins, RPH-CPP (Pharmacist) on 10/31/21 at [place] List Status: <None>   Medication Order Taking? Sig Documenting Provider Last Dose Status Informant  acetaminophen (TYLENOL) 650 MG CR tablet [id number] Yes Take 325-650 mg by mouth every 8 (eight) hours as needed for pain. [provider] Taking Active Self  aspirin EC 81 MG tablet [id number] Yes Take 81 mg by mouth daily. [provider] Taking Active Self  calcium carbonate (TUMS - DOSED IN MG ELEMENTAL CALCIUM) 500 MG chewable tablet [id number] Yes Chew 1 tablet by mouth daily. [provider] Taking Active Self  Cholecalciferol (VITAMIN D-3) 25 MCG (1000 UT) CAPS [id number] Yes Take 1,000 Units by mouth daily with breakfast. [provider] Taking Active Self  denosumab (PROLIA) 60 MG/ML SOSY injection [id number] Yes Inject 60 mg into the skin every  6 (six) months. [provider] Taking Active            Med Note Briant Cedar   Wed Jul 27, 2021  8:06 AM)    ELIQUIS 5 MG TABS tablet [id number] Yes TAKE ONE (1) TABLET BY MOUTH TWO (2) TIMES DAILY Josue Hector, MD Taking Active   famotidine-calcium carbonate-magnesium hydroxide (PEPCID COMPLETE) 10-800-165 MG chewable tablet [id number] Yes Chew 1 tablet by mouth daily as needed. [provider] Taking Active   folic acid (FOLVITE) 1 MG tablet [id number] Yes TAKE ONE (1) TABLET BY MOUTH EACH DAY Josue Hector, MD Taking Active   loratadine (CLARITIN) 10 MG tablet [id number] Yes Take 10 mg by mouth daily as needed for allergies. [provider] Taking Active   Multiple Vitamin (MULITIVITAMIN WITH MINERALS) TABS [id number] Yes Take 1 tablet by mouth daily. [provider] Taking Active Self  nitroGLYCERIN (NITROSTAT) 0.4 MG SL tablet [id number] Yes Place 1 tablet (0.4 mg total) under the tongue every 5 (five) minutes x 3 doses as needed for chest pain.  Josue Hector, MD Taking Active            Med Note Scanlon, Lester Kinsman Oct 19, 2021 12:54 PM) PRN  rosuvastatin (CRESTOR) 10 MG tablet [id number] Yes TAKE 1 TABLET BY MOUTH DAILY Josue Hector, MD Taking Active             Patient Active Problem List   Diagnosis Date Noted   Abdominal pain, epigastric    Anxiety and depression 06/22/2016   COPD (chronic obstructive pulmonary disease) ([place]) 03/25/2016   Annual physical exam 03/24/2016   PCP NOTES >>>>>>>>>>>>>> 08/27/2015   Allergic rhinitis 03/09/2015   Headache 02/23/2015   Bradycardia 06/15/2014   Arthritis, degenerative 06/15/2014   Hyperglycemia 01/08/2014   Paroxysmal A-fib ([place]) 12/28/2013   Diverticulosis of colon without hemorrhage 10/13/2013   Mixed hyperlipidemia 04/18/2010   CAD (coronary artery disease) 02/25/2010   HIP FRACTURE, RIGHT 12/13/2009   IBS 09/01/2009   LUMBAR RADICULOPATHY, RIGHT 06/09/2009   Esophageal reflux 09/22/2008   PALPITATIONS 05/11/2008   HYPERPLASIA, PROSTATE NOS W/URINARY OBST/LUTS 09/20/2007   Closed fracture of thoracic vertebra ([place]) 09/20/2007   Osteoporosis 01/01/2007    Immunization History  Administered Date(s) Administered   Influenza Whole 09/05/2007, 08/13/2009, 08/27/2012   Influenza, High Dose Seasonal PF 08/20/2018, 10/02/2019, 08/30/2020, 08/29/2021   Influenza,inj,Quad PF,6+ Mos 08/25/2016   Influenza-Unspecified 08/26/2013, 08/28/2015, 08/28/2017   Moderna Sars-Covid-2 Vaccination 12/10/2019, 02/05/2020, 09/29/2020   PPD Test 05/16/2011   Pneumococcal Conjugate-13 05/17/2015   Pneumococcal Polysaccharide-23 05/18/2011, 05/05/2021   Tdap 05/16/2011   Zoster Recombinat (Shingrix) 05/20/2020, 08/26/2020    Conditions to be addressed/monitored: HLD and pre DM; osteoporosis; BPH / elevated PSA; h/o CABG; GERD; hyperkalemia; atrial fibrillation  Care Plan : General Pharmacy (Adult)  Updates made by Cherre Robins, RPH-CPP since 10/31/2021 12:00 AM      Problem: PVCs; dysrhythmia; HDL; osteoporosis      Long-Range Goal: Provide education, support and care coordination for medication therapy and chronic conditions   Start Date: 05/04/2021  Recent Progress: On track  Priority: High  Note:   Current Barriers:  Chronic Disease Management support, education, and care coordination needs related to Afib, Pre-Diabetes, Hyperlipidemia/CAD, GERD, Osteoporosis, Allergic Rhinitis, Arthritis   Pharmacist Clinical Goal(s):  Over the next 180 days, patient will achieve adherence to monitoring guidelines and medication adherence to achieve therapeutic efficacy maintain control of hyperlipidemia and pre DM as evidenced  by attainment of goal listed below  Received next dose of Prolia  through collaboration with PharmD and provider.   Interventions: 1:1 collaboration with Colon Branch, MD regarding development and update of comprehensive plan of care as evidenced by provider attestation and co-signature Inter-disciplinary care team collaboration (see longitudinal plan of care) Comprehensive medication review performed; medication list updated in electronic medical record  Hyperlipidemia Controlled; LDL goal < 70 Current regimen:  Rosuvastatin 86m daily Interventions:  Reviewed last lipids panel and discussed LDL goals and importance of taking statin with history of CABG Maintain cholesterol medication regimen.   Pre-Diabetes Controlled; maintain A1c goal <6.5% Does not check blood glucose at home. Current regimen:  Diet and exercise management    Interventions: Discussed diet extensively Discussed exercise Maintain A1c <6.5%  Hyperkalemia Goal serum potassium 3.5 to 5.2; Last potassium was slightly elevated at 5.3 Denies symptoms - no cramping / muscle twitching Interventions: Discussed last potassium level - mailed lab results at patient's request Discussed foods that can increase potassium Consider stopping mutlivitamin if continues to  be elevated.  Consider recheck potassium in 4 to 6 weeks  Osteoporosis:  Goal: prevent falls and fractures and increase bone density Last DEXA was 01/31/2021 T-Score at lumbar spine L1 to L4 was -0.9 (was 2.2 in 2020 - improved by 12.5%);  T-Score at femoral neck was -2.0 (was -3.0 - improved by 11.1% from 2020) Osteoporosis diagnosed in 2008; h/o vertebral fracture; Past therapy:  Fosamax - 2007-2009, then restarted in 2016-11/2017  - stopped because patient reported jaw degeneration but no evidence of any degeneration Prolia 12/2017 - 08/2018 - patient stopped because he felt he was developing jaw degeneration Prolia restarted  -tolerated well 02/2020, 10/2020 Current Therapy:  Prolia 62m- receives at endocrinilogist office every 6 months - next due 11/09/2021 or after  Vitamin D 1000 IU daily  Calcium / Tums 50056maily Interventions: Reviewed and discussed recent DEXA - printed copy for patient to pick up tomorrow Patient has appointment for Prolia injection scheduled for Dr GheArman Filterfice this month 10/2021.  Discussed importance of continued calcium and vitamin d supplementatio Continue weight bearing exercise every day - walking, weight lifting. Great job!    Medication management Pharmacist Clinical Goal(s): Over the next 180 days, patient will work with PharmD and providers to maintain optimal medication adherence Current pharmacy: [facility] Comprehensive medication review performed. Continue current medication management strategy Collaboration with provider regarding medication management (folic acid indication) Patient self care activities - Over the next 180 days, patient will: Focus on medication adherence by filling and taking medications appropriately  Take medications as prescribed Report any questions or concerns to PharmD and/or provider(s)  Patient Goals/Self-Care Activities Over the next 180 days, patient will:  take medications as  prescribed,  target a minimum of 150 minutes of moderate intensity exercise weekly received next Prolia injection  Follow Up Plan: Telephone follow up appointment with care management team member scheduled for:  April 2023          Medication Assistance: None required.  Patient affirms current coverage meets needs.  [facility] [id number]: [phone]: [phone] pill box? Yes Pt endorses 100% compliance  Follow Up:  Patient agrees to Care Plan and Follow-up.  Plan: Telephone follow up appointment with care management team member scheduled for:  4 months  Rashel Okeefe,  PharmD Clinical Pharmacist [place] [phone]

## 2021-10-31 NOTE — Patient Instructions (Signed)
[name] It was a pleasure speaking with you today.  I have attached a summary of our visit today and information about your health goals.   Our next appointment is by telephone on March 02, 2022  at 1:30pm  Please call the care guide team at [phone] if you need to cancel or reschedule your appointment.    If you have any questions or concerns, please feel free to contact me either at the phone number below or with a MyChart message.   Keep up the good work!  Cherre Robins, PharmD Clinical Pharmacist [place] [phone] (direct line)  [phone] (main office number)  CARE PLAN ENTRY (see longitudinal plan of care for additional care plan information)  Current Barriers:  Chronic Disease Management support, education, and care coordination needs related to Afib, Pre-Diabetes, Hyperlipidemia/CAD, GERD, Osteoporosis, Allergic Rhinitis, Arthritis    Hyperlipidemia Lab Results  Component Value Date/Time   LDLCALC 19 05/05/2021 10:45 AM   Pharmacist Clinical Goal(s): Over the next 180 days, patient will work with PharmD and providers to maintain LDL goal < 70 Current regimen:  Rosuvastatin 10mg  daily Patient self care activities - Over the next 180 days, patient will: Maintain cholesterol medication regimen.   Pre-Diabetes Lab Results  Component Value Date/Time   HGBA1C 6.0 10/19/2021 01:31 PM   HGBA1C 6.1 05/05/2021 10:45 AM   Pharmacist Clinical Goal(s): Over the next 180 days, patient will work with PharmD and providers to maintain A1c goal <6.5% Current regimen:  Diet and exercise management    Interventions: Discussed diet extensively Discussed exercise Patient self care activities - Over the next 180 days, patient will: Maintain a1c <6.5%  Hyperkalemia Pharmacist Clinical Goal(s) Over the next 180 days, patient will work with PharmD and providers to monitor potassium  Interventions: Discussed last potassium level -  slightly elevated at 5.3 Discussed foods that can increase potassium Patient self care activities - Over the next 180 days, patient will: Cut back on foods high in potassium  Hold Multivitamin to see if potassium remains in normal range.  Consider recheck potassium in 4 to 6 weeks  Osteoporosis:  Pharmacist Clinical Goal(s) Over the next 180 days, patient will work with PharmD and providers decrease fall risk and increase bone density Current Therapy:  Prolia 60mg  - receives at endocrinilogist office every 6 months - next due 05/06/2021 or after Vitamin D 1000 IU daily  Calcium / Tums 500mg  daily Interventions: Recommended continue current calcium and vitamin D supplementation and Prolia Discussed importance of continued calcium and vitamin d supplementation Patient self care activities - Over the next 180 days, patient will: Continue current therapy for bone health and to prevent fractures Continue weight bearing exercise every day - walking, weight lifting. Great job!    Medication management Pharmacist Clinical Goal(s): Over the next 180 days, patient will work with PharmD and providers to maintain optimal medication adherence Current pharmacy: [facility] Comprehensive medication review performed. Continue current medication management strategy Collaboration with provider regarding medication management (folic acid indication) Patient self care activities - Over the next 180 days, patient will: Focus on medication adherence by filling and taking medications appropriately  Take medications as prescribed Report any questions or concerns to PharmD and/or provider(s)     The patient verbalized understanding of instructions, educational materials, and care plan provided today and agreed to receive a mailed copy of patient instructions, educational materials, and care plan.

## 2021-11-11 ENCOUNTER — Other Ambulatory Visit: Payer: Self-pay

## 2021-11-11 ENCOUNTER — Ambulatory Visit: Payer: Medicare Other

## 2021-11-11 DIAGNOSIS — M810 Age-related osteoporosis without current pathological fracture: Secondary | ICD-10-CM | POA: Diagnosis not present

## 2021-11-11 MED ORDER — DENOSUMAB 60 MG/ML ~~LOC~~ SOSY
60.0000 mg | PREFILLED_SYRINGE | Freq: Once | SUBCUTANEOUS | Status: AC
  Administered 2021-11-11: 60 mg via SUBCUTANEOUS

## 2021-11-11 NOTE — Progress Notes (Signed)
Administered prolia to Left arm. Patient tolerated well.

## 2021-11-12 NOTE — Telephone Encounter (Signed)
Last Prolia inj 11/11/21 Next Prolia inj due 05/14/11

## 2021-11-26 DIAGNOSIS — I480 Paroxysmal atrial fibrillation: Secondary | ICD-10-CM | POA: Diagnosis not present

## 2021-11-26 DIAGNOSIS — M810 Age-related osteoporosis without current pathological fracture: Secondary | ICD-10-CM

## 2021-11-26 DIAGNOSIS — I25810 Atherosclerosis of coronary artery bypass graft(s) without angina pectoris: Secondary | ICD-10-CM

## 2021-11-26 DIAGNOSIS — E782 Mixed hyperlipidemia: Secondary | ICD-10-CM

## 2021-12-14 ENCOUNTER — Ambulatory Visit: Payer: Medicare Other | Admitting: Internal Medicine

## 2021-12-14 ENCOUNTER — Other Ambulatory Visit: Payer: Self-pay

## 2021-12-14 ENCOUNTER — Encounter: Payer: Self-pay | Admitting: Internal Medicine

## 2021-12-14 VITALS — BP 118/60 | HR 63 | Ht 74.0 in | Wt 161.8 lb

## 2021-12-14 DIAGNOSIS — M810 Age-related osteoporosis without current pathological fracture: Secondary | ICD-10-CM

## 2021-12-14 LAB — VITAMIN D 25 HYDROXY (VIT D DEFICIENCY, FRACTURES): VITD: 42.45 ng/mL (ref 30.00–100.00)

## 2021-12-14 NOTE — Patient Instructions (Addendum)
Please stop at the lab.  Continue vitamin D 1000 units daily and the multivitamin.  Please come back for a follow-up appointment in 1 year.

## 2021-12-14 NOTE — Progress Notes (Signed)
Patient ID: [name], male   DOB: 1930/03/09, 88 y.o.   MRN: [id number]   This visit occurred during the SARS-CoV-2 public health emergency.  Safety protocols were in place, including screening questions prior to the visit, additional usage of staff PPE, and extensive cleaning of exam room while observing appropriate contact time as indicated for disinfecting solutions.   HPI  [name] is a 88 y.o.-year-old pleasan male, initially referred by his PCP, Dr. Larose Kells, returning for follow-up osteoporosis.  Last visit 1 year ago.  Interim history: No falls or fractures since last visit. No vertigo, disequilibrium, orthostasis. He had palpitations in 07/2021 - PVCs. Now resolved. He had extensive cardiac investigation >> negative. He has fatigue and brain fog. He has stiffness of his back - walking, stretching.  Reviewed history: He was diagnosed with osteoporosis in 2008  I first saw the patient in 2020 and at that time I suggested either Tymlos/Forteo or Romosozumab Field seismologist).   He did not decide for these medications but  we decided to restart Prolia. He felt better after starting this, with less joint pain and more energy.  Reviewed previous DXA scan reports: 01/31/2021 ([place]) Lumbar spine L1-L4 (L3) Femoral neck (FN)  T-score -0.9 LFN: -2.0  Change in BMD from previous DXA test (%) + 2.5% +11.1%  (*) statistically significant  01/22/2019 ([place]) Lumbar spine L1-L4 Femoral neck (FN)  T-score -2.2 LFN: -3.0  Change in BMD from previous DXA test (%) +1.4% -8.0%*  (*) statistically significant  Date L1-L4 T score FN T score  10/14/2015 ([place]) -2.3 LFN: -2.6  10/04/2007 -2.5 RFN: -2.8 LFN: -2.3   Reviewed previous fractures: - vertebral compression fractures per review of the abd. CT report from 08/16/2018: moderate T8, mild T9, T10, moderate T12 compression fractures. He feels they developed after lifting heavy objects. - Right hip fracture 03/2008: "There is a spiral fracture  of the proximal aspect of the right femur.  This begins in the intertrochanteric region and extends into the proximal femur.  There is a avulsion of the lesser trochanter.  The femoral head is located within the acetabulum. Degenerative changes are noted in the lower spine.  Bones appear Osteopenic".  Osteopenia was detected on x ray of sacrum/coccyx 01/14/2016.  He denies dizziness, vertigo, orthostasis, or vision.  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  After last visit I gave him a prescription for physical therapy (03/2020) at [facility] rehab. He had 6 sessions >> very helpful >> now exercises by himself.  He does not take high vitamin A doses.  He has a family history of osteoporosis in one of his sisters and possibly also in his mother.  No consistent history of hyper or hypocalcemia or hyperparathyroidism.  No history of kidney stones. Lab Results  Component Value Date   CALCIUM 9.5 10/19/2021   CALCIUM 9.5 07/23/2021   CALCIUM 9.6 03/28/2021   CALCIUM 9.7 05/27/2020   CALCIUM 10.4 (H) 05/14/2020   CALCIUM 7.9 (L) 05/09/2020   CALCIUM 9.2 03/21/2020   CALCIUM 9.7 12/17/2019   CALCIUM 10.0 12/09/2019   CALCIUM 9.4 05/27/2019   No history of thyrotoxicosis: Lab Results  Component Value Date    TSH 3.31 10/19/2021   TSH 2.44 08/14/2018   TSH 2.61 02/19/2018   TSH 2.36 08/08/2016   TSH 2.00 08/06/2014   No CKD. Last BUN/Cr: Lab Results  Component Value Date   BUN 20 10/19/2021   CREATININE 1.11 10/19/2021   H/o CABG 2011.  He also has a history of A. fib.  ROS: + Joint aches, + easy bruising  I reviewed pt's medications, allergies, PMH, social hx, family hx, and changes were documented in the history of present illness. Otherwise, unchanged from my initial visit note.  Past Medical History:  Diagnosis Date   Anxiety    Atrial fibrillation ([place])    Blood transfusion without reported diagnosis    CAD    a. 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 // Myoview 9/22: EF 57, normal perfusion; low risk   Cataract    Chronic abdominal pain    Chronic nausea    Coronary artery disease    Diverticulosis 2011   Diverticulitis 2004   Dyspepsia    Esophageal reflux    GERD (gastroesophageal reflux disease)    Headache    saw neuro 4-16, MRI done   History of echocardiogram    Echo 9/22: EF 60-65, no RWMA, normal diastolic function, GLS -23.3, normal RVSF, normal PASP, mild-moderate BAE, trivial MR, moderate MAC, moderate AV calcification, mild AI, mild aortic stenosis (mean gradient 5 mmHg, V-max 158 cm/s, DI 0.62), mild dilation of ascending aorta (40 mm)   HYPERPLASIA, PROSTATE NOS W/URINARY OBST/LUTS    IBS    INGUINAL HERNIA    Lichen planus [id number]   [name]   LUMBAR RADICULOPATHY, RIGHT    Mixed hyperlipidemia    OSTEOPOROSIS    PONV (postoperative nausea and vomiting)    PREMATURE VENTRICULAR CONTRACTIONS    a. After CABG - was on Coumadin/Multaq for a period of time. Coumadin discontinued 04/2010 after maintaining NSR.   UNSPECIFIED ANEMIA    Past Surgical History:  Procedure Laterality Date   BIOPSY  09/10/2018   Procedure: BIOPSY;  Surgeon: Jerene Bears, MD;  Location: [facility]  ENDOSCOPY;  Service: Gastroenterology;;   CATARACT EXTRACTION Right 10/06/2013   CORONARY ARTERY BYPASS GRAFT     2 VD;anomalous RCA;post op compliacted by AF   ESOPHAGOGASTRODUODENOSCOPY (EGD) WITH PROPOFOL N/A 09/10/2018   Procedure: ESOPHAGOGASTRODUODENOSCOPY (EGD) WITH PROPOFOL;  Surgeon: Jerene Bears, MD;  Location: [facility] ENDOSCOPY;  Service: Gastroenterology;  Laterality: N/A;   HIP FRACTURE SURGERY Right 03/2007   Dr. Salvadore Farber   INGUINAL HERNIA REPAIR Left 2012   Dr Brantley Stage   KNEE SURGERY Right 1977   repair   [place]  09/19/2013   [facility]   Social History   Socioeconomic History   Marital status: Widowed    Spouse name: Not on file   Number of children: 0   Years of education: Not on file   Highest education level: Not on file  Occupational History   Occupation: retired    Fish farm manager: RETIRED  Tobacco Use   Smoking status: Former    Types: Cigarettes    Quit date: 11/27/1962    Years since quitting: 59.0   Smokeless tobacco: Never  Vaping Use   Vaping Use: Never used  Substance and Sexual Activity   Alcohol use: Not Currently    Alcohol/week: 0.0 standard drinks    Comment: quit drinking    Drug use: No   Sexual activity: Not Currently  Other Topics Concern   Not on file  Social History Narrative   Lives at [facility] in independent living.     Lost wife 06-2016.     Has no children.  Family: nephews-nices in [place]   Retired from [employer].     Still drives.    Social Determinants of Health   Financial Resource Strain: Low Risk    Difficulty of Paying Living Expenses: Not very hard  Food Insecurity: No Food Insecurity   Worried About Charity fundraiser in the Last Year: Never true   Ran Out of Food in the Last Year: Never true  Transportation Needs: No Transportation Needs   Lack of Transportation (Medical): No   Lack of Transportation (Non-Medical): No  Physical Activity: Sufficiently Active   Days of Exercise per Week: 7 days    Minutes of Exercise per Session: 40 min  Stress: No Stress Concern Present   Feeling of Stress : Not at all  Social Connections: Moderately Isolated   Frequency of Communication with Friends and Family: More than three times a week   Frequency of Social Gatherings with Friends and Family: More than three times a week   Attends Religious Services: More than 4 times per year   Active Member of [employer] or Organizations: No   Attends Archivist Meetings: Never   Marital Status: Widowed  Human resources officer Violence: Not At Risk   Fear of Current or Ex-Partner: No   Emotionally Abused: No   Physically Abused: No   Sexually Abused: No   Current Outpatient Medications on File Prior to Visit  Medication Sig Dispense Refill   acetaminophen (TYLENOL) 650 MG CR tablet Take 325-650 mg by mouth every 8 (eight) hours as needed for pain.     aspirin EC 81 MG tablet Take 81 mg by mouth daily.     calcium carbonate (TUMS - DOSED IN MG ELEMENTAL CALCIUM) 500 MG chewable tablet Chew 1 tablet by mouth daily.     Cholecalciferol (VITAMIN D-3) 25 MCG (1000 UT) CAPS Take 1,000 Units by mouth daily with breakfast.     denosumab (PROLIA) 60 MG/ML SOSY injection Inject 60 mg into the skin every 6 (six) months.     ELIQUIS 5 MG TABS tablet TAKE ONE (1) TABLET BY MOUTH TWO (2) TIMES DAILY 60 tablet 5   famotidine-calcium carbonate-magnesium hydroxide (PEPCID COMPLETE) 10-800-165 MG chewable tablet Chew 1 tablet by mouth daily as needed.     folic acid (FOLVITE) 1 MG tablet TAKE ONE (1) TABLET BY MOUTH EACH DAY 90 tablet 3   loratadine (CLARITIN) 10 MG tablet Take 10 mg by mouth daily as needed for allergies.     Multiple Vitamin (MULITIVITAMIN WITH MINERALS) TABS Take 1 tablet by mouth daily.     nitroGLYCERIN (NITROSTAT) 0.4 MG SL tablet Place 1 tablet (0.4 mg total) under the tongue every 5 (five) minutes x 3 doses as needed for chest pain. 25 tablet 3   rosuvastatin (CRESTOR) 10 MG tablet TAKE 1 TABLET BY  MOUTH DAILY  90 tablet 3   No current facility-administered medications on file prior to visit.   Allergies  Allergen Reactions   Amoxicillin Diarrhea, Nausea And Vomiting and Other (See Comments)    Patient questions this   Zoloft [Sertraline Hcl]     Patient didn't like the way it made him feel   Remeron [Mirtazapine] Other (See Comments)    Caused excessive drowsiness   Family History  Problem Relation Age of Onset   Stroke Mother    Pancreatic cancer Father        Deceased, 42   Breast cancer Sister        Deceased   Heart disease Sister 75   Stroke Maternal Aunt    Colon cancer Neg Hx    Prostate cancer Neg Hx    Esophageal cancer Neg Hx    Rectal cancer Neg Hx    Stomach cancer Neg Hx    PE: BP 118/60 (BP Location: Left Arm, Patient Position: Sitting, Cuff Size: Normal)    Pulse 63    Ht _0  (1.88 m)    Wt 161 lb 12.8 oz (73.4 kg)    SpO2 99%    BMI 20.77 kg/m  Wt Readings from Last 3 Encounters:  12/14/21 161 lb 12.8 oz (73.4 kg)  10/19/21 162 lb 4 oz (73.6 kg)  09/07/21 160 lb (72.6 kg)   Constitutional: normal weight, in NAD Eyes: PERRLA, EOMI, no exophthalmos ENT: moist mucous membranes, no thyromegaly, no cervical lymphadenopathy Cardiovascular: RRR, No MRG Respiratory: CTA B Musculoskeletal: no deformities, strength intact in all 4 Skin: moist, warm, no rashes Neurological: no tremor with outstretched hands, DTR normal in all 4  Assessment: 1. Osteoporosis  Plan: 1. Osteoporosis -Likely age-related and she also has a family history of osteoporosis -he has an increased risk for fracture due to previous history of fractures: lumbar compression fractures developed after lifting buckets of water for his garden, also typical, osteoporotic, right hip fracture -but which developed while on Fosamax) and also a T score lower than -2.5.  -In the past, after his right hip fracture, he was advised to stop Fosamax.  He did restart afterwards but developed jaw  "degeneration" (most likely not ONJ -we will look together as ONJ pictures and he adamantly denied having had a similar picture).  Per his description, his jaw was locking up while on medication.  We discussed that this may have been due to osteoarthritis, but unlikely to be due to the medication.  She tried Prolia afterwards but he again developed jaw "degeneration" and stopped. -At our first visit, I suggested either Prolia or an anabolic agent but, after our discussion, he decided to use Prolia instead.  He had 4 injections so far, without side effects. -We discussed in the past about possible side effects from Prolia including osteonecrosis of the jaw.  He is consistent with visits to his dentist: No jaw problems.  No thigh/hip pain.  In fact, after starting Prolia, he started to feel better with less joint pains -We reviewed together his latest DXA scan report and I also reviewed the images.  It appears that his bone mineral density is improved significantly at the level of the spine and the right femoral neck.  This is an excellent result.  It is of note this L3 vertebra was not analyzed on the last bone density scan due to degenerative changes. -His calcium and kidney function were normal in 09/2021.  Will not repeat these today.   -vitamin  D level was normal at last visit.  We will recheck the level today -He continues on calcium (Tums) 1 tablet daily and 1000 units vitamin D daily + the amount in the multivitamin -I advised him to continue exercise and again discussed about fall precautions.  He does have a walker, but he is not using it.  He did not Fall since last visit.  He continues stretches and walking and very light exercises.  We discussed about increasing weightbearing exercises and advised him which weights to use and for which exercises.  Also, he has access to the gym at the facility where he resides and I advised him to start to use their machines.  He is now walking half a mile on their  track. -He does describe fatigue and brain fog.  I reviewed recent TSH which was normal.  No vertigo or falls. -I will see him back in a year, but sooner for Prolia injections  Orders Placed This Encounter  Procedures   VITAMIN D 25 Hydroxy (Vit-D Deficiency, Fractures)   - Total time spent for the visit: 25 minutes, in obtaining medical information from the chart, reviewing his  previous labs, imaging evaluations, and treatments, reviewing his symptoms, counseling him about his condition (please see the discussed topics above), and developing a plan to further investigate and treat it; he had a number of questions which I addressed.  Component     Latest Ref Rng & Units 12/14/2021  VITD     30.00 - 100.00 ng/mL 42.45  Normal vit D level.  Philemon Kingdom, MD PhD [facility] Endocrinology

## 2021-12-15 ENCOUNTER — Telehealth: Payer: Self-pay

## 2021-12-15 NOTE — Telephone Encounter (Signed)
Attempted to contact the patient regarding lab results, LVM for a call back.

## 2021-12-15 NOTE — Telephone Encounter (Signed)
-----   Message from Philemon Kingdom, MD sent at 12/15/2021 11:40 AM EST ----- Can you please call pt.:  Normal vit D level.

## 2021-12-19 ENCOUNTER — Ambulatory Visit: Payer: Medicare Other | Admitting: Medical

## 2021-12-19 ENCOUNTER — Encounter: Payer: Self-pay | Admitting: Medical

## 2021-12-19 VITALS — BP 118/60 | HR 79 | Resp 18 | Ht 74.0 in | Wt 160.0 lb

## 2021-12-19 DIAGNOSIS — K219 Gastro-esophageal reflux disease without esophagitis: Secondary | ICD-10-CM

## 2021-12-19 DIAGNOSIS — E782 Mixed hyperlipidemia: Secondary | ICD-10-CM

## 2021-12-19 DIAGNOSIS — I25810 Atherosclerosis of coronary artery bypass graft(s) without angina pectoris: Secondary | ICD-10-CM | POA: Diagnosis not present

## 2021-12-19 DIAGNOSIS — E875 Hyperkalemia: Secondary | ICD-10-CM | POA: Diagnosis not present

## 2021-12-19 LAB — COMPREHENSIVE METABOLIC PANEL
ALT: 14 U/L (ref 0–53)
AST: 23 U/L (ref 0–37)
Albumin: 4.5 g/dL (ref 3.5–5.2)
Alkaline Phosphatase: 45 U/L (ref 39–117)
BUN: 19 mg/dL (ref 6–23)
CO2: 30 mEq/L (ref 19–32)
Calcium: 10.1 mg/dL (ref 8.4–10.5)
Chloride: 103 mEq/L (ref 96–112)
Creatinine, Ser: 0.78 mg/dL (ref 0.40–1.50)
GFR: 79.23 mL/min (ref >60.00)
Glucose, Bld: 93 mg/dL (ref 70–99)
Potassium: 5.1 mEq/L (ref 3.5–5.1)
Sodium: 140 mEq/L (ref 135–145)
Total Bilirubin: 0.5 mg/dL (ref 0.2–1.2)
Total Protein: 7.6 g/dL (ref 6.0–8.3)

## 2021-12-19 LAB — TROPONIN I (HIGH SENSITIVITY): High Sens Troponin I: 12 ng/L (ref 2–17)

## 2021-12-19 NOTE — Progress Notes (Signed)
Subjective:    Patient ID: [name], male    DOB: 12/22/1929, 88 y.o.   MRN: [id number]  HPI  Pt states this morning he woke up. He states he slept well all last night.  His bp was 158/80 his self check. He went down the hall and ws 159/82 around 10:30.   He feels slight unsteady on his feet. No ha, no blurred vision, no gross motor or sensory function deficits. No vision changes.  Pt has high cholesterol. In 2011 had bypass surgery. Has been on eliquis since then. He also states he is on baby aspirin.   Pt states Saturday had some upset epigastric tenderness. He took famotadine and belched. He states stomach felt. He also makes the comment that felt like something was behind heart that needed to get out.   Since Saturday stomach sensation went away.   Pt states last year had echo, stress test and holter.  07-29-2021 echo was good. I don't see last cardiologist notes?.    Review of Systems  Constitutional:  Negative for chills and fatigue.  HENT:  Negative for congestion and drooling.   Respiratory:  Negative for cough, chest tightness, shortness of breath and wheezing.   Cardiovascular:  Negative for chest pain and palpitations.  Gastrointestinal:  Positive for abdominal pain.       Jerrye Bushy possible on Saturday.  Genitourinary:  Negative for dysuria, flank pain and frequency.  Musculoskeletal:  Negative for back pain.    Past Medical History:  Diagnosis Date   Anxiety    Atrial fibrillation ([place])    Blood transfusion without reported diagnosis    CAD    a. 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 // Myoview 9/22: EF 57, normal perfusion; low risk   Cataract    Chronic abdominal pain    Chronic nausea    Coronary artery disease    Diverticulosis 2011   Diverticulitis 2004   Dyspepsia    Esophageal reflux    GERD (gastroesophageal reflux disease)    Headache    saw neuro 4-16, MRI done    History of echocardiogram    Echo 9/22: EF 60-65, no RWMA, normal diastolic function, GLS -31.5, normal RVSF, normal PASP, mild-moderate BAE, trivial MR, moderate MAC, moderate AV calcification, mild AI, mild aortic stenosis (mean gradient 5 mmHg, V-max 158 cm/s, DI 0.62), mild dilation of ascending aorta (40 mm)   HYPERPLASIA, PROSTATE NOS W/URINARY OBST/LUTS    IBS    INGUINAL HERNIA    Lichen planus 1761   [name]   LUMBAR RADICULOPATHY, RIGHT    Mixed hyperlipidemia    OSTEOPOROSIS    PONV (postoperative nausea and vomiting)    PREMATURE VENTRICULAR CONTRACTIONS    a. After CABG - was on Coumadin/Multaq for a period of time. Coumadin discontinued 04/2010 after maintaining NSR.   UNSPECIFIED ANEMIA      Social History   Socioeconomic History   Marital status: Widowed    Spouse name: Not on file   Number of children: 0   Years of education: Not on file   Highest education level: Not on file  Occupational History   Occupation: retired    Fish farm manager: RETIRED  Tobacco Use   Smoking status: Former    Types: Cigarettes    Quit date: 11/27/1962    Years since quitting: 59.1   Smokeless tobacco: Never  Vaping Use  Vaping Use: Never used  Substance and Sexual Activity   Alcohol use: Not Currently    Alcohol/week: 0.0 standard drinks    Comment: quit drinking    Drug use: No   Sexual activity: Not Currently  Other Topics Concern   Not on file  Social History Narrative   Lives at [facility] in independent living.     Lost wife 06-2016.     Has no children.  Family: nephews-nices in [place]   Retired from [employer].     Still drives.    Social Determinants of Health   Financial Resource Strain: Low Risk    Difficulty of Paying Living Expenses: Not very hard  Food Insecurity: No Food Insecurity   Worried About Charity fundraiser in the Last Year: Never true   Ran Out of Food in the Last Year: Never true  Transportation Needs: No Transportation Needs   Lack of  Transportation (Medical): No   Lack of Transportation (Non-Medical): No  Physical Activity: Sufficiently Active   Days of Exercise per Week: 7 days   Minutes of Exercise per Session: 40 min  Stress: No Stress Concern Present   Feeling of Stress : Not at all  Social Connections: Moderately Isolated   Frequency of Communication with Friends and Family: More than three times a week   Frequency of Social Gatherings with Friends and Family: More than three times a week   Attends Religious Services: More than 4 times per year   Active Member of [employer] or Organizations: No   Attends Archivist Meetings: Never   Marital Status: Widowed  Intimate Partner Violence: Not At Risk   Fear of Current or Ex-Partner: No   Emotionally Abused: No   Physically Abused: No   Sexually Abused: No    Past Surgical History:  Procedure Laterality Date   BIOPSY  09/10/2018   Procedure: BIOPSY;  Surgeon: Jerene Bears, MD;  Location: [facility];  Service: Gastroenterology;;   CATARACT EXTRACTION Right 10/06/2013   CORONARY ARTERY BYPASS GRAFT     2 VD;anomalous RCA;post op compliacted by AF   ESOPHAGOGASTRODUODENOSCOPY (EGD) WITH PROPOFOL N/A 09/10/2018   Procedure: ESOPHAGOGASTRODUODENOSCOPY (EGD) WITH PROPOFOL;  Surgeon: Jerene Bears, MD;  Location: [facility];  Service: Gastroenterology;  Laterality: N/A;   HIP FRACTURE SURGERY Right 03/2007   Dr. Salvadore Farber   INGUINAL HERNIA REPAIR Left 2012   Dr Brantley Stage   KNEE SURGERY Right 1977   repair   [place]  09/19/2013   [facility]    Family History  Problem Relation Age of Onset   Stroke Mother    Pancreatic cancer Father        Deceased, 20   Breast cancer Sister        Deceased   Heart disease Sister 46   Stroke Maternal Aunt    Colon cancer Neg Hx    Prostate cancer Neg Hx    Esophageal cancer Neg Hx    Rectal cancer Neg Hx    Stomach cancer Neg Hx     Allergies  Allergen Reactions   Amoxicillin Diarrhea, Nausea  And Vomiting and Other (See Comments)    Patient questions this   Zoloft [Sertraline Hcl]     Patient didn't like the way it made him feel   Remeron [Mirtazapine] Other (See Comments)    Caused excessive drowsiness    Current Outpatient Medications on File Prior to Visit  Medication Sig Dispense Refill   acetaminophen (  TYLENOL) 650 MG CR tablet Take 325-650 mg by mouth every 8 (eight) hours as needed for pain.     aspirin EC 81 MG tablet Take 81 mg by mouth daily.     calcium carbonate (TUMS - DOSED IN MG ELEMENTAL CALCIUM) 500 MG chewable tablet Chew 1 tablet by mouth daily.     Cholecalciferol (VITAMIN D-3) 25 MCG (1000 UT) CAPS Take 1,000 Units by mouth daily with breakfast.     denosumab (PROLIA) 60 MG/ML SOSY injection Inject 60 mg into the skin every 6 (six) months.     ELIQUIS 5 MG TABS tablet TAKE ONE (1) TABLET BY MOUTH TWO (2) TIMES DAILY 60 tablet 5   famotidine-calcium carbonate-magnesium hydroxide (PEPCID COMPLETE) 10-800-165 MG chewable tablet Chew 1 tablet by mouth daily as needed.     folic acid (FOLVITE) 1 MG tablet TAKE ONE (1) TABLET BY MOUTH EACH DAY 90 tablet 3   loratadine (CLARITIN) 10 MG tablet Take 10 mg by mouth daily as needed for allergies.     Multiple Vitamin (MULITIVITAMIN WITH MINERALS) TABS Take 1 tablet by mouth daily.     nitroGLYCERIN (NITROSTAT) 0.4 MG SL tablet Place 1 tablet (0.4 mg total) under the tongue every 5 (five) minutes x 3 doses as needed for chest pain. 25 tablet 3   rosuvastatin (CRESTOR) 10 MG tablet TAKE 1 TABLET BY MOUTH DAILY 90 tablet 3   No current facility-administered medications on file prior to visit.    BP 118/60 Comment: 130/60 second check.   Pulse 79    Resp 18    Ht _0  (1.88 m)    Wt 160 lb (72.6 kg)    SpO2 99%    BMI 20.54 kg/m       Objective:   Physical Exam  General Mental Status- Alert. General Appearance- Not in acute distress.   Skin General: Color- Normal Color. Moisture- Normal  Moisture.  Neck Carotid Arteries- Normal color. Moisture- Normal Moisture. No carotid bruits. No JVD.  Chest and Lung Exam Auscultation: Breath Sounds:-Normal.  Cardiovascular Auscultation:Rythm- Regular. Murmurs & Other Heart Sounds:Auscultation of the heart reveals- No Murmurs.  Abdomen Inspection:-Inspeection Normal. Palpation/Percussion:Note:No mass. Palpation and Percussion of the abdomen reveal- Non Tender, Non Distended + BS, no rebound or guarding.    Neurologic Cranial Nerve exam:- CN III-XII intact(No nystagmus), symmetric smile. Drift Test:- No drift. Romberg Exam:- Negative.  Heal to Toe Gait exam:-Normal. Finger to Nose:- Normal/Intact Strength:- 5/5 equal and symmetric strength both upper and lower extremities.       Assessment & Plan:   Patient Instructions  Your blood pressure was mild to moderate elevated compared to normal baseline blood pressure which is low/tightly controlled.  No history of hypertension.  It is unclear whether or not you had high salt food on [place]. That might have led to higher blood pressure readings this morning.  Your blood pressure was 118/60 when I first checked and then on second repeat blood pressure check it was 130/60.  I want you to check your blood pressure daily over the next week.  If you get a mild to moderate elevated blood pressure reading initially then wait 5 minutes and check blood pressure.  If still little high but coming down and also recommend checking third time.  Write your blood pressure readings down and will review those readings in 1 week..  If blood pressures are consistently higher than 140/90 let us know before 1 week.  He described feeling mild unsteady on  your feet but no gross motor or sensory function deficits described.  Completely normal neurologic exam.  CAD and history of CABG.  No obvious cardiac signs symptoms reported but she did note on Saturday some epigastric area pain that resolves with  famotidine and belching.  No recurrent signs/symptoms since.  Since he described discomfort like it was a new your heart decided to go ahead and get EKG today.  Also decided to get 1 set of troponin stat.  2 days since ED epigastric discomfort but if troponin were to be elevated would need you to be further evaluated in the emergency department.  Keep follow-up appointments with your cardiologist.  If any point cardiac or neurologic signs symptoms then be seen in the emergency department.  Follow-up in 1 week with PCP or myself.  Sooner if needed.    Mackie Pai, PA-C

## 2021-12-19 NOTE — Addendum Note (Signed)
Addended by: Jenaye Rickert M on: 10/29/2022 02:35 PM   Modules accepted: Orders  

## 2021-12-19 NOTE — Patient Instructions (Addendum)
Your blood pressure was mild to moderate elevated compared to normal baseline blood pressure which is low/tightly controlled.  No history of hypertension.  It is unclear whether or not you had high salt food on [place]. That might have led to higher blood pressure readings this morning.  Your blood pressure was 118/60 when I first checked and then on second repeat blood pressure check it was 130/60.  I want you to check your blood pressure daily over the next week.  If you get a mild to moderate elevated blood pressure reading initially then wait 5 minutes and check blood pressure.  If still little high but coming down and also recommend checking third time.  Write your blood pressure readings down and will review those readings in 1 week..  If blood pressures are consistently higher than 140/90 let us know before 1 week.  Sinus rhythm and sinus rhythm.  Computer read incomplete right bundle branch block.  Similar to August 2022 EKG. however on this EKG no PVCs which were seen on prior August EKG.  He described feeling mild unsteady on your feet but no gross motor or sensory function deficits described.  Completely normal neurologic exam.  CAD and history of CABG.  No obvious cardiac signs symptoms reported but she did note on Saturday some epigastric area pain that resolves with famotidine and belching.  No recurrent signs/symptoms since.  Since he described discomfort like it was a new your heart decided to go ahead and get EKG today.  Also decided to get 1 set of troponin stat.  2 days since ED epigastric discomfort but if troponin were to be elevated would need you to be further evaluated in the emergency department.  Keep follow-up appointments with your cardiologist.  If any point cardiac or neurologic signs symptoms then be seen in the emergency department.  Follow-up in 1 week with PCP or myself.  Sooner if needed.

## 2021-12-27 ENCOUNTER — Ambulatory Visit (INDEPENDENT_AMBULATORY_CARE_PROVIDER_SITE_OTHER): Payer: Medicare Other | Admitting: Medical

## 2021-12-27 VITALS — BP 136/60 | HR 68 | Resp 18 | Ht 74.0 in | Wt 160.0 lb

## 2021-12-27 DIAGNOSIS — R059 Cough, unspecified: Secondary | ICD-10-CM | POA: Diagnosis not present

## 2021-12-27 DIAGNOSIS — R439 Unspecified disturbances of smell and taste: Secondary | ICD-10-CM | POA: Diagnosis not present

## 2021-12-27 DIAGNOSIS — R0789 Other chest pain: Secondary | ICD-10-CM

## 2021-12-27 LAB — POC COVID19 BINAXNOW: SARS Coronavirus 2 Ag: NEGATIVE

## 2021-12-27 LAB — TROPONIN I (HIGH SENSITIVITY): High Sens Troponin I: 9 ng/L (ref 2–17)

## 2021-12-27 NOTE — Patient Instructions (Addendum)
Your blood pressures overall are in a good range.  Mostly blood pressure readings are 120-130/70 range.  Very rare systolic elevation into the 140 range.  Based on these readings I do not think you need to be on any hypertension med.  With readings such as this I would defer to cardiology if they want you more tightly controlled.  History of CABG with intermittent mild atypical chest pain.  We got EKG again today and will repeat 1 set of troponin as I did on the last visit.  No pain presently and none since yesterday evening so 1 set of troponin should be adequate.  However if you have any recurrent pain then have to recommend being seen in the emergency department.  I am going to go ahead and refer you back to your cardiologist and see if they might be able to see you sooner than April as already scheduled.  EKG today showed sinus rhythm.  Similar EKG to 1 done last week.  Mild decreased appetite and you report decreased taste.  Very rare mild intermittent cough/clearing of her throat in the morning.  I doubt that you have COVID but for caution sake we will go ahead and do rapid COVID test.  Your lungs do sound clear presently. Covid test was negative.  Follow-up as regular scheduled with PCP or sooner if needed.

## 2021-12-27 NOTE — Progress Notes (Signed)
Subjective:    Patient ID: [name], male    DOB: 1930-02-28, 88 y.o.   MRN: [id number]  HPI  Pt in for follow up.  Bp readings have been mostly all good. Most of readings have been I 120-130/70 range. Occasional rare 037-048 systolic range.  Overall last week pt states decreased appetiite and some slight decreased taste.   Some rare brief cough in morning to bring up phlem. Mild runny nose.  No fever or sweats. He states felt chill yesterday around 4-6.    Pt states around 4 pm had very faint low level intermittent mild chest pain until 8 pm. No recurrent pain since then.   Scheduled to see Dr. Johnsie Cancel in April        Review of Systems  Constitutional:  Negative for chills, fatigue and fever.  HENT:  Positive for congestion.   Respiratory:  Positive for cough. Negative for chest tightness, shortness of breath and wheezing.   Cardiovascular:  Negative for chest pain and palpitations.  Gastrointestinal:  Negative for abdominal pain, diarrhea, nausea and vomiting.  Musculoskeletal:  Negative for back pain.  Skin:  Negative for rash.  Neurological:  Negative for dizziness, speech difficulty, light-headedness, numbness and headaches.  Hematological:  Negative for adenopathy. Does not bruise/bleed easily.  Psychiatric/Behavioral:  Negative for behavioral problems, dysphoric mood and sleep disturbance. The patient is not nervous/anxious.     Past Medical History:  Diagnosis Date   Anxiety    Atrial fibrillation ([place])    Blood transfusion without reported diagnosis    CAD    a. 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 // Myoview 9/22: EF 57, normal perfusion; low risk   Cataract    Chronic abdominal pain    Chronic nausea    Coronary artery disease    Diverticulosis 2011   Diverticulitis 2004   Dyspepsia    Esophageal reflux    GERD (gastroesophageal reflux disease)    Headache    saw neuro  4-16, MRI done   History of echocardiogram    Echo 9/22: EF 60-65, no RWMA, normal diastolic function, GLS -88.9, normal RVSF, normal PASP, mild-moderate BAE, trivial MR, moderate MAC, moderate AV calcification, mild AI, mild aortic stenosis (mean gradient 5 mmHg, V-max 158 cm/s, DI 0.62), mild dilation of ascending aorta (40 mm)   HYPERPLASIA, PROSTATE NOS W/URINARY OBST/LUTS    IBS    INGUINAL HERNIA    Lichen planus 1694   [name]   LUMBAR RADICULOPATHY, RIGHT    Mixed hyperlipidemia    OSTEOPOROSIS    PONV (postoperative nausea and vomiting)    PREMATURE VENTRICULAR CONTRACTIONS    a. After CABG - was on Coumadin/Multaq for a period of time. Coumadin discontinued 04/2010 after maintaining NSR.   UNSPECIFIED ANEMIA      Social History   Socioeconomic History   Marital status: Widowed    Spouse name: Not on file   Number of children: 0   Years of education: Not on file   Highest education level: Not on file  Occupational History   Occupation: retired    Fish farm manager: RETIRED  Tobacco Use   Smoking status: Former    Types: Cigarettes    Quit date: 11/27/1962    Years since quitting: 59.1   Smokeless tobacco: Never  Vaping Use   Vaping Use: Never used  Substance and Sexual Activity   Alcohol  use: Not Currently    Alcohol/week: 0.0 standard drinks    Comment: quit drinking    Drug use: No   Sexual activity: Not Currently  Other Topics Concern   Not on file  Social History Narrative   Lives at [facility] in independent living.     Lost wife 06-2016.     Has no children.  Family: nephews-nices in [place]   Retired from [employer].     Still drives.    Social Determinants of Health   Financial Resource Strain: Low Risk    Difficulty of Paying Living Expenses: Not very hard  Food Insecurity: No Food Insecurity   Worried About Charity fundraiser in the Last Year: Never true   Ran Out of Food in the Last Year: Never true  Transportation Needs: No Transportation  Needs   Lack of Transportation (Medical): No   Lack of Transportation (Non-Medical): No  Physical Activity: Sufficiently Active   Days of Exercise per Week: 7 days   Minutes of Exercise per Session: 40 min  Stress: No Stress Concern Present   Feeling of Stress : Not at all  Social Connections: Moderately Isolated   Frequency of Communication with Friends and Family: More than three times a week   Frequency of Social Gatherings with Friends and Family: More than three times a week   Attends Religious Services: More than 4 times per year   Active Member of [employer] or Organizations: No   Attends Archivist Meetings: Never   Marital Status: Widowed  Intimate Partner Violence: Not At Risk   Fear of Current or Ex-Partner: No   Emotionally Abused: No   Physically Abused: No   Sexually Abused: No    Past Surgical History:  Procedure Laterality Date   BIOPSY  09/10/2018   Procedure: BIOPSY;  Surgeon: Jerene Bears, MD;  Location: [facility];  Service: Gastroenterology;;   CATARACT EXTRACTION Right 10/06/2013   CORONARY ARTERY BYPASS GRAFT     2 VD;anomalous RCA;post op compliacted by AF   ESOPHAGOGASTRODUODENOSCOPY (EGD) WITH PROPOFOL N/A 09/10/2018   Procedure: ESOPHAGOGASTRODUODENOSCOPY (EGD) WITH PROPOFOL;  Surgeon: Jerene Bears, MD;  Location: [facility];  Service: Gastroenterology;  Laterality: N/A;   HIP FRACTURE SURGERY Right 03/2007   Dr. Salvadore Farber   INGUINAL HERNIA REPAIR Left 2012   Dr Brantley Stage   KNEE SURGERY Right 1977   repair   [place]  09/19/2013   [facility]    Family History  Problem Relation Age of Onset   Stroke Mother    Pancreatic cancer Father        Deceased, 82   Breast cancer Sister        Deceased   Heart disease Sister 76   Stroke Maternal Aunt    Colon cancer Neg Hx    Prostate cancer Neg Hx    Esophageal cancer Neg Hx    Rectal cancer Neg Hx    Stomach cancer Neg Hx     Allergies  Allergen Reactions   Amoxicillin  Diarrhea, Nausea And Vomiting and Other (See Comments)    Patient questions this   Zoloft [Sertraline Hcl]     Patient didn't like the way it made him feel   Remeron [Mirtazapine] Other (See Comments)    Caused excessive drowsiness    Current Outpatient Medications on File Prior to Visit  Medication Sig Dispense Refill   acetaminophen (TYLENOL) 650 MG CR tablet Take 325-650 mg by mouth every 8 (  eight) hours as needed for pain.     aspirin EC 81 MG tablet Take 81 mg by mouth daily.     calcium carbonate (TUMS - DOSED IN MG ELEMENTAL CALCIUM) 500 MG chewable tablet Chew 1 tablet by mouth daily.     Cholecalciferol (VITAMIN D-3) 25 MCG (1000 UT) CAPS Take 1,000 Units by mouth daily with breakfast.     denosumab (PROLIA) 60 MG/ML SOSY injection Inject 60 mg into the skin every 6 (six) months.     ELIQUIS 5 MG TABS tablet TAKE ONE (1) TABLET BY MOUTH TWO (2) TIMES DAILY 60 tablet 5   famotidine-calcium carbonate-magnesium hydroxide (PEPCID COMPLETE) 10-800-165 MG chewable tablet Chew 1 tablet by mouth daily as needed.     folic acid (FOLVITE) 1 MG tablet TAKE ONE (1) TABLET BY MOUTH EACH DAY 90 tablet 3   loratadine (CLARITIN) 10 MG tablet Take 10 mg by mouth daily as needed for allergies.     Multiple Vitamin (MULITIVITAMIN WITH MINERALS) TABS Take 1 tablet by mouth daily.     nitroGLYCERIN (NITROSTAT) 0.4 MG SL tablet Place 1 tablet (0.4 mg total) under the tongue every 5 (five) minutes x 3 doses as needed for chest pain. 25 tablet 3   rosuvastatin (CRESTOR) 10 MG tablet TAKE 1 TABLET BY MOUTH DAILY 90 tablet 3   No current facility-administered medications on file prior to visit.    BP 136/60    Pulse 68    Resp 18    Ht _0  (1.88 m)    Wt 160 lb (72.6 kg)    SpO2 99%    BMI 20.54 kg/m       Objective:   Physical Exam  General Mental Status- Alert. General Appearance- Not in acute distress.   Skin General: Color- Normal Color. Moisture- Normal Moisture.  Neck Carotid Arteries-  Normal color. Moisture- Normal Moisture. No carotid bruits. No JVD.  Chest and Lung Exam Auscultation: Breath Sounds:-Normal.  Cardiovascular Auscultation:Rythm- Regular. Murmurs & Other Heart Sounds:Auscultation of the heart reveals- No Murmurs.  Abdomen Inspection:-Inspeection Normal. Palpation/Percussion:Note:No mass. Palpation and Percussion of the abdomen reveal- Non Tender, Non Distended + BS, no rebound or guarding.    Neurologic Cranial Nerve exam:- CN III-XII intact(No nystagmus), symmetric smile. Drift Test:- No drift. Romberg Exam:- Negative.  Heal to Toe Gait exam:-Normal. Finger to Nose:- Normal/Intact Strength:- 5/5 equal and symmetric strength both upper and lower extremities.       Assessment & Plan:   Patient Instructions  Your blood pressures overall are in a good range.  Mostly blood pressure readings are 120-130/70 range.  Very rare systolic elevation into the 140 range.  Based on these readings I do not think you need to be on any hypertension med.  With readings such as this I would defer to cardiology if they want you more tightly controlled.  History of CABG with intermittent mild atypical chest pain.  We got EKG again today and will repeat 1 set of troponin as I did on the last visit.  No pain presently and none since yesterday evening so 1 set of troponin should be adequate.  However if you have any recurrent pain then have to recommend being seen in the emergency department.  I am going to go ahead and refer you back to your cardiologist and see if they might be able to see you sooner than April as already scheduled.  EKG today showed sinus rhythm.  Similar EKG to 1 done last week.  Mild decreased appetite and you report decreased taste.  Very rare mild intermittent cough/clearing of her throat in the morning.  I doubt that you have COVID but for caution sake we will go ahead and do rapid COVID test.  Your lungs do sound clear presently.   Follow-up as  regular scheduled with PCP or sooner if needed.     Mackie Pai, PA-C

## 2022-01-01 NOTE — Progress Notes (Signed)
Chief Complaint  Patient presents with   Follow-up    CAD   History of Present Illness:88 yo [name] with history of CAD s/p 3V CABG, PAF, HLD, GERD, mild AS here today for evaluation of his blood pressure. He is followed in our office by Dr. Johnsie Cancel. He is added on to my schedule today as an urgent visit to discuss his blood pressure. Normal stress test in September 2022. Echo September 2022 with normal LV function and mild AS. Cardiac monitor September 2022 with sinus with 13 beat run of atrial tachycardia.   He tells me today that he feels great. The patient denies any chest pain, dyspnea, palpitations, lower extremity edema, orthopnea, PND, dizziness, near syncope or syncope. His BP at home has been 889-169 systolic. He was not sure if he should be concerned about blood pressures in the 130s. He was seen in primary care last week and his BP was 136/60. Two weeks ago it was 118/60 in primary care. He does not take anti-hypertensive therapy. EKG from primary care office last week shows sinus, incomplete RBBB.   Primary Care Physician: Colon Branch, MD Primary Cardiologist: Johnsie Cancel  Past Medical History:  Diagnosis Date   Anxiety    Atrial fibrillation [facility])    Blood transfusion without reported diagnosis    CAD    a. 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 // Myoview 9/22: EF 57, normal perfusion; low risk   Cataract    Chronic abdominal pain    Chronic nausea    Coronary artery disease    Diverticulosis 2011   Diverticulitis 2004   Dyspepsia    Esophageal reflux    GERD (gastroesophageal reflux disease)    Headache    saw neuro 4-16, MRI done   History of echocardiogram    Echo 9/22: EF 60-65, no RWMA, normal diastolic function, GLS -45.0, normal RVSF, normal PASP, mild-moderate BAE, trivial MR, moderate MAC, moderate AV calcification, mild AI, mild aortic stenosis (mean gradient 5 mmHg, V-max 158 cm/s, DI  0.62), mild dilation of ascending aorta (40 mm)   HYPERPLASIA, PROSTATE NOS W/URINARY OBST/LUTS    IBS    INGUINAL HERNIA    Lichen planus [id number]   [name]   LUMBAR RADICULOPATHY, RIGHT    Mixed hyperlipidemia    OSTEOPOROSIS    PONV (postoperative nausea and vomiting)    PREMATURE VENTRICULAR CONTRACTIONS    a. After CABG - was on Coumadin/Multaq for a period of time. Coumadin discontinued 04/2010 after maintaining NSR.   UNSPECIFIED ANEMIA     Past Surgical History:  Procedure Laterality Date   BIOPSY  09/10/2018   Procedure: BIOPSY;  Surgeon: Jerene Bears, MD;  Location: [facility];  Service: Gastroenterology;;   CATARACT EXTRACTION Right 10/06/2013   CORONARY ARTERY BYPASS GRAFT     2 VD;anomalous RCA;post op compliacted by AF   ESOPHAGOGASTRODUODENOSCOPY (EGD) WITH PROPOFOL N/A 09/10/2018   Procedure: ESOPHAGOGASTRODUODENOSCOPY (EGD) WITH PROPOFOL;  Surgeon: Jerene Bears, MD;  Location: [facility];  Service: Gastroenterology;  Laterality: N/A;   HIP FRACTURE SURGERY Right 03/2007   Dr. Salvadore Farber   INGUINAL HERNIA REPAIR Left 2012   Dr Brantley Stage   KNEE SURGERY Right 1977   repair   PROSTATE BIOPSY  09/19/2013   [facility]    Current Outpatient Medications  Medication Sig Dispense Refill   acetaminophen (TYLENOL) 650 MG CR tablet Take  325-650 mg by mouth every 8 (eight) hours as needed for pain.     aspirin EC 81 MG tablet Take 81 mg by mouth daily.     calcium carbonate (TUMS - DOSED IN MG ELEMENTAL CALCIUM) 500 MG chewable tablet Chew 1 tablet by mouth daily.     Cholecalciferol (VITAMIN D-3) 25 MCG (1000 UT) CAPS Take 1,000 Units by mouth daily with breakfast.     denosumab (PROLIA) 60 MG/ML SOSY injection Inject 60 mg into the skin every 6 (six) months.     ELIQUIS 5 MG TABS tablet TAKE ONE (1) TABLET BY MOUTH TWO (2) TIMES DAILY 60 tablet 5   famotidine-calcium carbonate-magnesium hydroxide (PEPCID COMPLETE) 10-800-165 MG chewable tablet Chew 1 tablet by  mouth daily as needed.     folic acid (FOLVITE) 1 MG tablet TAKE ONE (1) TABLET BY MOUTH EACH DAY 90 tablet 3   loratadine (CLARITIN) 10 MG tablet Take 10 mg by mouth daily as needed for allergies.     Multiple Vitamin (MULITIVITAMIN WITH MINERALS) TABS Take 1 tablet by mouth daily.     nitroGLYCERIN (NITROSTAT) 0.4 MG SL tablet Place 1 tablet (0.4 mg total) under the tongue every 5 (five) minutes x 3 doses as needed for chest pain. 25 tablet 3   rosuvastatin (CRESTOR) 10 MG tablet TAKE 1 TABLET BY MOUTH DAILY 90 tablet 3   No current facility-administered medications for this visit.    Allergies  Allergen Reactions   Amoxicillin Diarrhea, Nausea And Vomiting and Other (See Comments)    Patient questions this   Zoloft [Sertraline Hcl]     Patient didn't like the way it made him feel   Remeron [Mirtazapine] Other (See Comments)    Caused excessive drowsiness    Social History   Socioeconomic History   Marital status: Widowed    Spouse name: Not on file   Number of children: 0   Years of education: Not on file   Highest education level: Not on file  Occupational History   Occupation: retired    Fish farm manager: RETIRED  Tobacco Use   Smoking status: Former    Types: Cigarettes    Quit date: 11/27/1962    Years since quitting: 59.1   Smokeless tobacco: Never  Vaping Use   Vaping Use: Never used  Substance and Sexual Activity   Alcohol use: Not Currently    Alcohol/week: 0.0 standard drinks    Comment: quit drinking    Drug use: No   Sexual activity: Not Currently  Other Topics Concern   Not on file  Social History Narrative   Lives at [place] in independent living.     Lost wife 06-2016.     Has no children.  Family: nephews-nices in [place] Eric [name]   Retired from [employer].     Still drives.    Social Determinants of Health   Financial Resource Strain: Low Risk    Difficulty of Paying Living Expenses: Not very hard  Food Insecurity: No Food Insecurity   Worried About  Charity fundraiser in the Last Year: Never true   Ran Out of Food in the Last Year: Never true  Transportation Needs: No Transportation Needs   Lack of Transportation (Medical): No   Lack of Transportation (Non-Medical): No  Physical Activity: Sufficiently Active   Days of Exercise per Week: 7 days   Minutes of Exercise per Session: 40 min  Stress: No Stress Concern Present   Feeling of Stress : Not at  all  Social Connections: Moderately Isolated   Frequency of Communication with Friends and Family: More than three times a week   Frequency of Social Gatherings with Friends and Family: More than three times a week   Attends Religious Services: More than 4 times per year   Active Member of [employer] or Organizations: No   Attends Archivist Meetings: Never   Marital Status: Widowed  Human resources officer Violence: Not At Risk   Fear of Current or Ex-Partner: No   Emotionally Abused: No   Physically Abused: No   Sexually Abused: No    Family History  Problem Relation Age of Onset   Stroke Mother    Pancreatic cancer Father        Deceased, 14   Breast cancer Sister        Deceased   Heart disease Sister 52   Stroke Maternal Aunt    Colon cancer Neg Hx    Prostate cancer Neg Hx    Esophageal cancer Neg Hx    Rectal cancer Neg Hx    Stomach cancer Neg Hx     Review of Systems:  As stated in the HPI and otherwise negative.   BP 134/62    Pulse 67    Ht _0  (1.88 m)    Wt 161 lb 6.4 oz (73.2 kg)    SpO2 97%    BMI 20.72 kg/m   Physical Examination: General: Well developed, well nourished, NAD  HEENT: OP clear, mucus membranes moist  SKIN: warm, dry. No rashes. Neuro: No focal deficits  Musculoskeletal: Muscle strength 5/5 all ext  Psychiatric: Mood and affect normal  Neck: No JVD, no carotid bruits, no thyromegaly, no lymphadenopathy.  Lungs:Clear bilaterally, no wheezes, rhonci, crackles Cardiovascular: Regular rate and rhythm. No murmurs, gallops or  rubs. Abdomen:Soft. Bowel sounds present. Non-tender.  Extremities: No lower extremity edema. Pulses are 2 + in the bilateral DP/PT.  EKG:  EKG is not ordered today. The ekg ordered today demonstrates   Recent Labs: 07/23/2021: Magnesium 2.2 10/19/2021: Hemoglobin 13.3; Platelets 156.0; TSH 3.31 12/19/2021: ALT 14; BUN 19; Creatinine, Ser 0.78; Potassium 5.1; Sodium 140   Lipid Panel    Component Value Date/Time   CHOL 105 05/05/2021 1045   TRIG 152.0 (H) 05/05/2021 1045   HDL 56.20 05/05/2021 1045   CHOLHDL 2 05/05/2021 1045   VLDL 30.4 05/05/2021 1045   LDLCALC 19 05/05/2021 1045     Wt Readings from Last 3 Encounters:  01/02/22 161 lb 6.4 oz (73.2 kg)  12/27/21 160 lb (72.6 kg)  12/19/21 160 lb (72.6 kg)      Assessment and Plan:   1. CAD s/p CABG: No chest pain. Continue current therapy  2. HTN: BP is within normal limits at home. He feels great. No changes today  Labs/ tests ordered today include:  No orders of the defined types were placed in this encounter.    Disposition:   F/U with Dr. Johnsie Cancel in 6 months.    Signed, Lauree Chandler, MD 01/02/2022 10:25 AM    [facility] [place], [place],   [address] Phone: [phone]; Fax: [phone]

## 2022-01-02 ENCOUNTER — Encounter: Payer: Self-pay | Admitting: Cardiovascular Disease

## 2022-01-02 ENCOUNTER — Ambulatory Visit: Payer: Medicare Other | Admitting: Cardiovascular Disease

## 2022-01-02 ENCOUNTER — Other Ambulatory Visit: Payer: Self-pay

## 2022-01-02 VITALS — BP 134/62 | HR 67 | Ht 74.0 in | Wt 161.4 lb

## 2022-01-02 DIAGNOSIS — I2510 Atherosclerotic heart disease of native coronary artery without angina pectoris: Secondary | ICD-10-CM

## 2022-01-02 NOTE — Patient Instructions (Signed)
Medication Instructions:  No changes *If you need a refill on your cardiac medications before your next appointment, please call your pharmacy*   Lab Work: None Ordered If you have labs (blood work) drawn today and your tests are completely normal, you will receive your results only by: [place] (if you have MyChart) OR A paper copy in the mail If you have any lab test that is abnormal or we need to change your treatment, we will call you to review the results.   Testing/Procedures: None Ordered   Follow-Up: At [facility], you and your health needs are our priority.  As part of our continuing mission to provide you with exceptional heart care, we have created designated Provider Care Teams.  These Care Teams include your primary Cardiologist (physician) and Advanced Practice Providers (APPs -  Physician Assistants and Nurse Practitioners) who all work together to provide you with the care you need, when you need it.  We recommend signing up for the patient portal called "MyChart".  Sign up information is provided on this After Visit Summary.  MyChart is used to connect with patients for Virtual Visits (Telemedicine).  Patients are able to view lab/test results, encounter notes, upcoming appointments, etc.  Non-urgent messages can be sent to your provider as well.   To learn more about what you can do with MyChart, go to [email or web address].    Your next appointment:   6 month(s)  The format for your next appointment:   In Person  Provider:   Jenkins Rouge, MD {   Other Instructions

## 2022-01-05 ENCOUNTER — Other Ambulatory Visit: Payer: Self-pay | Admitting: Cardiovascular Disease

## 2022-01-05 DIAGNOSIS — R079 Chest pain, unspecified: Secondary | ICD-10-CM

## 2022-03-02 ENCOUNTER — Ambulatory Visit (INDEPENDENT_AMBULATORY_CARE_PROVIDER_SITE_OTHER): Payer: Medicare Other | Admitting: Pharmacist

## 2022-03-02 DIAGNOSIS — M810 Age-related osteoporosis without current pathological fracture: Secondary | ICD-10-CM

## 2022-03-02 DIAGNOSIS — I25810 Atherosclerosis of coronary artery bypass graft(s) without angina pectoris: Secondary | ICD-10-CM

## 2022-03-02 DIAGNOSIS — E782 Mixed hyperlipidemia: Secondary | ICD-10-CM

## 2022-03-02 DIAGNOSIS — I480 Paroxysmal atrial fibrillation: Secondary | ICD-10-CM

## 2022-03-02 NOTE — Patient Instructions (Addendum)
[name], ?It was a pleasure speaking with you  ?Below is a summary of your health goals and care plan ? ?Remember to get your Prolia injection after 05/14/2022 from Dr Arman Filter office ?Continue to exercise daily - great job with walking balance classes and weight lifting! Goal is to get at least 150 minutes of physical activity per week. ? ?If you have any questions or concerns, please feel free to contact me either at the phone number below or with a MyChart message.  ? ?Keep up the good work! ? ?Cherre Robins, PharmD ?Clinical Pharmacist ?[facility] ?[place] ?[phone] (direct line)  ?[phone] (main office number) ? ? ?Chronic Care Management Care Plan ?  ?Hyperlipidemia ?Lab Results  ?Component Value Date/Time  ? LDLCALC 19 05/05/2021 10:45 AM  ? ?Pharmacist Clinical Goal(s): ?Over the next 180 days, patient will work with PharmD and providers to maintain LDL goal < 70 ?Current regimen:  ?Rosuvastatin '10mg'$  daily ?Patient self care activities - Over the next 180 days, patient will: ?Maintain cholesterol medication regimen.  ? ?Pre-Diabetes ?Lab Results  ?Component Value Date/Time  ? HGBA1C 6.0 10/19/2021 01:31 PM  ? HGBA1C 6.1 05/05/2021 10:45 AM  ? ?Pharmacist Clinical Goal(s): ?Over the next 180 days, patient will work with PharmD and providers to maintain A1c goal <6.5% ?Current regimen:  ?Diet and exercise management    ?Interventions: ?Discussed diet extensively ?Discussed exercise ?Patient self care activities - Over the next 180 days, patient will: ?Maintain a1c <6.5% ? ?Hyperkalemia ?Pharmacist Clinical Goal(s) ?Over the next 180 days, patient will work with PharmD and providers to monitor potassium  ?Interventions: ?Discussed last potassium level - slightly elevated at 5.3 ?Discussed foods that can increase potassium ?Patient self care activities - Over the next 180 days, patient will: ?Cut back on foods high in potassium  ?Hold Multivitamin to see if potassium remains in  normal range.  ?Consider recheck potassium in 4 to 6 weeks ? ?Osteoporosis:  ?Pharmacist Clinical Goal(s) ?Over the next 180 days, patient will work with PharmD and providers decrease fall risk and increase bone density ?Current Therapy:  ?Prolia '60mg'$  - receives at endocrinilogist office every 6 months - next due 05/14/2022 or after ?Vitamin D 1000 IU daily  ?Calcium / Tums '500mg'$  daily ?Interventions: ?Recommended continue current calcium and vitamin D supplementation and Prolia ?Discussed importance of continued calcium and vitamin d supplementation ?Patient self care activities - Over the next 180 days, patient will: ?Continue current therapy for bone health and to prevent fractures ?Continue weight bearing exercise every day - walking, weight lifting. Great job! ?Contact Dr Arman Filter office for Prolia appointment - will be due around 05/14/2022  ? ? ?Medication management ?Pharmacist Clinical Goal(s): ?Over the next 180 days, patient will work with PharmD and providers to maintain optimal medication adherence ?Current pharmacy: [facility] ?Interventions ?Comprehensive medication review performed. ?Continue current medication management strategy ?Collaboration with provider regarding medication management (folic acid indication) ?Patient self care activities - Over the next 180 days, patient will: ?Focus on medication adherence by filling and taking medications appropriately  ?Take medications as prescribed ?Report any questions or concerns to PharmD and/or provider(s) ? ?The patient verbalized understanding of instructions, educational materials, and care plan provided today and agreed to receive a mailed copy of patient instructions, educational materials, and care plan.   ?

## 2022-03-02 NOTE — Chronic Care Management (AMB) (Signed)
? ? ?Chronic Care Management ?Pharmacy Note ? ?03/02/2022 ?Name:  [name] MRN:  [id number] DOB:  02/19/1930 ? ?Summary:  ?Patient is doing well; potassium was a little elevated when checked 10/19/2021 but was back to normal 11/2021. Patient has been limiting high potassium foods.  ?Patient has been walking daily and doing balance exercise class several times per week.  Great job! ?Reminded him that next Prolia injection will be due after 05/14/2022 ?Reviewed medications and adherence. No issues with adherence noted.  ? ? ?Subjective: ?[name] is an 88 y.o. year old male who is a primary patient of Paz, Alda Berthold, MD.  The CCM team was consulted for assistance with disease management and care coordination needs.   ? ?Engaged with patient by telephone for follow up visit in response to provider referral for pharmacy case management and/or care coordination services.  ? ?Consent to Services:  ?The patient was given information about Chronic Care Management services, agreed to services, and gave verbal consent prior to initiation of services.  Please see initial visit note for detailed documentation.  ? ?Patient Care Team: ?Colon Branch, MD as PCP - General (Internal Medicine) ?Josue Hector, MD as PCP - Cardiology (Cardiology) ?Bjorn Loser, MD as Consulting Physician (Urology) ?Josue Hector, MD as Consulting Physician (Cardiology) ?Sydnee Levans, MD as Consulting Physician (Dermatology) ?Deneise Lever, MD as Consulting Physician (Pulmonary Disease) ?Alda Berthold, DO as Consulting Physician (Neurology) ?Inda Castle, MD (Inactive) as Consulting Physician (Gastroenterology) ?Clent Jacks, MD as Consulting Physician (Ophthalmology) ?Cherre Robins, RPH-CPP (Pharmacist) ? ?Recent office visits: ?12/27/2021 - Fam Med ([facility]) seen for follow up and decreased taste and smell. EKG was normal. Checked COVID test - was negative.  ?12/19/2021 - Fam Med ([place], [place]) Acute visit for elevated BP  at home and epigastric pain. Office BP at goal. No med changes. Labs checked - were normal. ?10/19/2021 - Int Med (Dr Larose Kells) annual physical. Noted gait discorder. Use cane and PT. ? ? ?Recent consult visits: ?12/14/2021 - Endo (Dr Cruzita Lederer) Seen for osteoporosis. Continue Prolia q 6 months.  ?11/11/2022 - Prolia injection received.  ?09/07/2021 - Cardio [place]) F/U palpitations. Reviewed recent testing. Low PVC burden. ECHO - EF 60-65%. Stress test = low risk. No med changes.  ?07/27/2021 - Cardio ([place]) F/U palpitations / afib. Noted PVC on EKG in office. Placed 14 day Zio AT onitor. Ordered 2D ECHO and stress test. Prescribed famotidine 56m twice a day for 2 weeks, then use as needed.  ? ?Hospital visits: ?None in the last 6 months ? ?Objective: ? ?Lab Results  ?Component Value Date  ? CREATININE 0.78 12/19/2021  ? CREATININE 1.11 10/19/2021  ? CREATININE 0.87 07/23/2021  ? ? ?Lab Results  ?Component Value Date  ? HGBA1C 6.0 10/19/2021  ? ?Last diabetic Eye exam: No results found for: HMDIABEYEEXA  ?Last diabetic Foot exam: No results found for: HMDIABFOOTEX  ? ?   ?Component Value Date/Time  ? CHOL 105 05/05/2021 1045  ? TRIG 152.0 (H) 05/05/2021 1045  ? HDL 56.20 05/05/2021 1045  ? CHOLHDL 2 05/05/2021 1045  ? VLDL 30.4 05/05/2021 1045  ? LR[place]ford19 05/05/2021 1045  ? ? ? ?  Latest Ref Rng & Units 12/19/2021  ?  2:54 PM 05/05/2021  ? 10:45 AM 03/21/2020  ?  6:15 PM  ?Hepatic Function  ?Total Protein 6.0 - 8.3 g/dL 7.6    7.2    ?Albumin 3.5 - 5.2 g/dL 4.5  4.1    ?AST 0 - 37 U/L 23   22   26     ?ALT 0 - 53 U/L 14   16   19     ?Alk Phosphatase 39 - 117 U/L 45    50    ?Total Bilirubin 0.2 - 1.2 mg/dL 0.5    0.4    ? ? ?Lab Results  ?Component Value Date/Time  ? TSH 3.31 10/19/2021 01:31 PM  ? TSH 2.44 08/14/2018 01:28 PM  ? FREET4 1.21 01/08/2014 02:48 PM  ? FREET4 0.79 06/12/2011 11:03 AM  ? ? ? ?  Latest Ref Rng & Units 10/19/2021  ?  1:31 PM 07/23/2021  ?  3:28 PM 03/28/2021  ?  9:51 AM  ?CBC  ?WBC 4.0  - 10.5 K/uL 8.0   8.3   8.9    ?Hemoglobin 13.0 - 17.0 g/dL 13.3   13.8   13.3    ?Hematocrit 39.0 - 52.0 % 41.1   42.4   41.1    ?Platelets 150.0 - 400.0 K/uL 156.0   160   189    ? ? ?Lab Results  ?Component Value Date/Time  ? VD25OH 42.45 12/14/2021 11:14 AM  ? VD25OH 36.3 12/09/2020 10:54 AM  ? VD25OH 46.37 12/09/2019 12:08 PM  ? ? ?Clinical ASCVD: Yes  ?The ASCVD Risk score (Arnett DK, et al., 2019) failed to calculate for the following reasons: ?  The 2019 ASCVD risk score is only valid for ages 12 to 67   ? ? ?Social History  ? ?Tobacco Use  ?Smoking Status Former  ? Types: Cigarettes  ? Quit date: 11/27/1962  ? Years since quitting: 59.3  ?Smokeless Tobacco Never  ? ?BP Readings from Last 3 Encounters:  ?01/02/22 134/62  ?12/27/21 136/60  ?12/19/21 118/60  ? ?Pulse Readings from Last 3 Encounters:  ?01/02/22 67  ?12/27/21 68  ?12/19/21 79  ? ?Wt Readings from Last 3 Encounters:  ?01/02/22 161 lb 6.4 oz (73.2 kg)  ?12/27/21 160 lb (72.6 kg)  ?12/19/21 160 lb (72.6 kg)  ? ? ?Assessment: Review of patient past medical history, allergies, medications, health status, including review of consultants reports, laboratory and other test data, was performed as part of comprehensive evaluation and provision of chronic care management services.  ? ?SDOH:  (Social Determinants of Health) assessments and interventions performed:  ? ? ? ?CCM Care Plan ? ?Allergies  ?Allergen Reactions  ? Amoxicillin Diarrhea, Nausea And Vomiting and Other (See Comments)  ?  Patient questions this  ? Zoloft [Sertraline Hcl]   ?  Patient didn't like the way it made him feel  ? Remeron [Mirtazapine] Other (See Comments)  ?  Caused excessive drowsiness  ? ? ?Medications Reviewed Today   ? ? Reviewed by Cherre Robins, RPH-CPP (Pharmacist) on 03/02/22 at 1349  Med List Status: <None>  ? ?Medication Order Taking? Sig Documenting Provider Last Dose Status Informant  ?acetaminophen (TYLENOL) 650 MG CR tablet [id number] Yes Take 325-650 mg by mouth  every 8 (eight) hours as needed for pain. [provider] Taking Active Self  ?aspirin EC 81 MG tablet [id number] Yes Take 81 mg by mouth daily. [provider] Taking Active Self  ?calcium carbonate (TUMS - DOSED IN MG ELEMENTAL CALCIUM) 500 MG chewable tablet [id number] Yes Chew 1 tablet by mouth daily. [provider] Taking Active Self  ?Cholecalciferol (VITAMIN D-3) 25 MCG (1000 UT) CAPS [id number] Yes Take 1,000 Units by mouth daily with breakfast. [provider] Taking  Active Self  ?denosumab (PROLIA) 60 MG/ML SOSY injection [id number] Yes Inject 60 mg into the skin every 6 (six) months. [provider] Taking Active   ?         ?Med Note Briant Cedar   Wed Jul 27, 2021  8:06 AM)    ?ELIQUIS 5 MG TABS tablet [id number] Yes TAKE ONE (1) TABLET BY MOUTH TWO (2) TIMES DAILY Josue Hector, MD Taking Active   ?famotidine-calcium carbonate-magnesium hydroxide (PEPCID COMPLETE) 10-800-165 MG chewable tablet [id number] Yes Chew 1 tablet by mouth daily as needed. [provider] Taking Active   ?folic acid (FOLVITE) 1 MG tablet [id number] Yes TAKE ONE (1) TABLET BY MOUTH EACH DAY Josue Hector, MD Taking Active   ?loratadine (CLARITIN) 10 MG tablet [id number] Yes Take 10 mg by mouth daily as needed for allergies. [provider] Taking Active   ?Multiple Vitamin (MULITIVITAMIN WITH MINERALS) TABS [id number] Yes Take 1 tablet by mouth daily. [provider] Taking Active Self  ?nitroGLYCERIN (NITROSTAT) 0.4 MG SL tablet [id number] Yes DISSOLVE 1 TABLET UNDER TONGUE EVERY 5 MINUTES FOR UP TO 3 DOSES AS NEEDED FOR CHEST PAIN Josue Hector, MD Taking Active   ?rosuvastatin (CRESTOR) 10 MG tablet [id number] Yes TAKE 1 TABLET BY MOUTH DAILY Josue Hector, MD Taking Active   ? ?  ?  ? ?  ? ? ?Patient Active Problem List  ? Diagnosis Date Noted  ? Abdominal pain, epigastric   ? Anxiety and depression 06/22/2016  ? COPD (chronic obstructive pulmonary  disease) ([place]) 03/25/2016  ? Annual physical exam 03/24/2016  ? PCP NOTES >>>>>>>>>>>>>> 08/27/2015  ? Allergic rhinitis 03/09/2015  ? Headache 02/23/2015  ? Bradycardia 06/15/2014  ? Arthritis, degenerative

## 2022-03-07 ENCOUNTER — Other Ambulatory Visit: Payer: Self-pay | Admitting: Cardiovascular Disease

## 2022-03-07 DIAGNOSIS — I480 Paroxysmal atrial fibrillation: Secondary | ICD-10-CM

## 2022-03-07 NOTE — Telephone Encounter (Signed)
Age 88, weight 73kg, SCr 0.78 on 12/19/21, afib indication, last OV 12/2021, refill sent in ?

## 2022-03-14 ENCOUNTER — Ambulatory Visit: Payer: Medicare Other | Admitting: Cardiovascular Disease

## 2022-03-26 DIAGNOSIS — E782 Mixed hyperlipidemia: Secondary | ICD-10-CM

## 2022-03-26 DIAGNOSIS — M810 Age-related osteoporosis without current pathological fracture: Secondary | ICD-10-CM

## 2022-03-30 ENCOUNTER — Encounter: Payer: Self-pay | Admitting: Medical

## 2022-03-30 ENCOUNTER — Ambulatory Visit (INDEPENDENT_AMBULATORY_CARE_PROVIDER_SITE_OTHER): Payer: Medicare Other | Admitting: Medical

## 2022-03-30 VITALS — BP 120/65 | HR 72 | Resp 18 | Ht 74.0 in | Wt 155.8 lb

## 2022-03-30 DIAGNOSIS — M545 Low back pain, unspecified: Secondary | ICD-10-CM

## 2022-03-30 MED ORDER — METHYLPREDNISOLONE 4 MG PO TABS: ORAL_TABLET | ORAL | 0 refills | Status: DC

## 2022-03-30 NOTE — Patient Instructions (Addendum)
Acute on chronic back pain. Typically daily mild low back pain in am that subsides quickly usually within 1 hour but today pain is not resolving and moderate level. Can't continue tylenol and add on medrol 4 day low dose taper. Can prescribe nsaid since on eliquis. ? ?I am placing future lumbar xray to do if by early next week you pain is not resolved. ? ?Follow up as regularly scheduled with pcp or sooner if needed for the above. ?

## 2022-03-30 NOTE — Progress Notes (Addendum)
? ?Subjective:  ? ? Patient ID: [name], male    DOB: [birth year], 88 y.o.   MRN: [id number] ? ?HPI ? ?Pt in with some recent onset of low back pain. Hx of back pain intermittently over the years. When younger would get back after gardening.  ? ?He notes usually will have stiff low back pain only in morning that will go away. But this morning pain is persisting which is unusually. He states if stands upright has less pain. Pain worse when he is bent over. ? ?Pain level 5-8/10.  ? ?Pt took 2 tylenol this morning at 9 am and did not help much. ? ? ?States for years he had pain that will go within an hour of waking.  ? ? ? ?Review of Systems  ?Constitutional:  Negative for chills, fatigue and fever.  ?Respiratory:  Negative for cough, chest tightness, shortness of breath and wheezing.   ?Cardiovascular:  Negative for chest pain and palpitations.  ?Gastrointestinal:  Negative for abdominal pain.  ?Musculoskeletal:  Positive for back pain.  ?Neurological:  Negative for dizziness and headaches.  ?Hematological:  Negative for adenopathy. Does not bruise/bleed easily.  ?Psychiatric/Behavioral:  Negative for behavioral problems and confusion.   ? ? ?Past Medical History:  ?Diagnosis Date  ? Anxiety   ? Atrial fibrillation ([place])   ? Blood transfusion without reported diagnosis   ? CAD   ? a. 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 // Myoview 9/22: EF 57, normal perfusion; low risk  ? Cataract   ? Chronic abdominal pain   ? Chronic nausea   ? Coronary artery disease   ? Diverticulosis 2011  ? Diverticulitis 2004  ? Dyspepsia   ? Esophageal reflux   ? GERD (gastroesophageal reflux disease)   ? Headache   ? saw neuro 4-16, MRI done  ? History of echocardiogram   ? Echo 9/22: EF 60-65, no RWMA, normal diastolic function, GLS -87.5, normal RVSF, normal PASP, mild-moderate BAE, trivial MR, moderate MAC, moderate AV calcification, mild AI, mild aortic  stenosis (mean gradient 5 mmHg, V-max 158 cm/s, DI 0.62), mild dilation of ascending aorta (40 mm)  ? HYPERPLASIA, PROSTATE NOS W/URINARY OBST/LUTS   ? IBS   ? INGUINAL HERNIA   ? Lichen planus [id number]  ? [name]  ? LUMBAR RADICULOPATHY, RIGHT   ? Mixed hyperlipidemia   ? OSTEOPOROSIS   ? PONV (postoperative nausea and vomiting)   ? PREMATURE VENTRICULAR CONTRACTIONS   ? a. After CABG - was on Coumadin/Multaq for a period of time. Coumadin discontinued 04/2010 after maintaining NSR.  ? UNSPECIFIED ANEMIA   ? ?  ?Social History  ? ?Socioeconomic History  ? Marital status: Widowed  ?  Spouse name: Not on file  ? Number of children: 0  ? Years of education: Not on file  ? Highest education level: Not on file  ?Occupational History  ? Occupation: retired  ?  Employer: RETIRED  ?Tobacco Use  ? Smoking status: Former  ?  Types: Cigarettes  ?  Quit date: 11/27/1962  ?  Years since quitting: 59.3  ? Smokeless tobacco: Never  ?Vaping Use  ? Vaping Use: Never used  ?Substance and Sexual Activity  ? Alcohol use: Not Currently  ?  Alcohol/week: 0.0 standard drinks  ?  Comment: quit drinking   ? Drug use: No  ? Sexual activity: Not Currently  ?Other Topics  Concern  ? Not on file  ?Social History Narrative  ? Lives at [facility] in independent living.    ? Lost wife 06-2016.    ? Has no children.  Family: nephews-nices in [place]  ? Retired from [employer].    ? Still drives.   ? ?Social Determinants of Health  ? ?Financial Resource Strain: Low Risk   ? Difficulty of Paying Living Expenses: Not very hard  ?Food Insecurity: No Food Insecurity  ? Worried About Charity fundraiser in the Last Year: Never true  ? Ran Out of Food in the Last Year: Never true  ?Transportation Needs: No Transportation Needs  ? Lack of Transportation (Medical): No  ? Lack of Transportation (Non-Medical): No  ?Physical Activity: Sufficiently Active  ? Days of Exercise per Week: 7 days  ? Minutes of Exercise per Session: 40 min  ?Stress: No Stress  Concern Present  ? Feeling of Stress : Not at all  ?Social Connections: Moderately Isolated  ? Frequency of Communication with Friends and Family: More than three times a week  ? Frequency of Social Gatherings with Friends and Family: More than three times a week  ? Attends Religious Services: More than 4 times per year  ? Active Member of Clubs or Organizations: No  ? Attends Archivist Meetings: Never  ? Marital Status: Widowed  ?Intimate Partner Violence: Not At Risk  ? Fear of Current or Ex-Partner: No  ? Emotionally Abused: No  ? Physically Abused: No  ? Sexually Abused: No  ? ? ?Past Surgical History:  ?Procedure Laterality Date  ? BIOPSY  09/10/2018  ? Procedure: BIOPSY;  Surgeon: Jerene Bears, MD;  Location: [facility];  Service: Gastroenterology;;  ? CATARACT EXTRACTION Right 10/06/2013  ? CORONARY ARTERY BYPASS GRAFT    ? 2 VD;anomalous RCA;post op compliacted by AF  ? ESOPHAGOGASTRODUODENOSCOPY (EGD) WITH PROPOFOL N/A 09/10/2018  ? Procedure: ESOPHAGOGASTRODUODENOSCOPY (EGD) WITH PROPOFOL;  Surgeon: Jerene Bears, MD;  Location: [facility];  Service: Gastroenterology;  Laterality: N/A;  ? HIP FRACTURE SURGERY Right 03/2007  ? Dr. Salvadore Farber  ? INGUINAL HERNIA REPAIR Left 2012  ? Dr Brantley Stage  ? KNEE SURGERY Right 1977  ? repair  ? PROSTATE BIOPSY  09/19/2013  ? [facility]  ? ? ?Family History  ?Problem Relation Age of Onset  ? Stroke Mother   ? Pancreatic cancer Father   ?     Deceased, 70  ? Breast cancer Sister   ?     Deceased  ? Heart disease Sister 58  ? Stroke Maternal Aunt   ? Colon cancer Neg Hx   ? Prostate cancer Neg Hx   ? Esophageal cancer Neg Hx   ? Rectal cancer Neg Hx   ? Stomach cancer Neg Hx   ? ? ?Allergies  ?Allergen Reactions  ? Amoxicillin Diarrhea, Nausea And Vomiting and Other (See Comments)  ?  Patient questions this  ? Zoloft [Sertraline Hcl]   ?  Patient didn't like the way it made him feel  ? Remeron [Mirtazapine] Other (See Comments)  ?  Caused excessive  drowsiness  ? ? ?Current Outpatient Medications on File Prior to Visit  ?Medication Sig Dispense Refill  ? acetaminophen (TYLENOL) 650 MG CR tablet Take 325-650 mg by mouth every 8 (eight) hours as needed for pain.    ? aspirin EC 81 MG tablet Take 81 mg by mouth daily.    ? calcium carbonate (TUMS - DOSED IN  MG ELEMENTAL CALCIUM) 500 MG chewable tablet Chew 1 tablet by mouth daily.    ? Cholecalciferol (VITAMIN D-3) 25 MCG (1000 UT) CAPS Take 1,000 Units by mouth daily with breakfast.    ? denosumab (PROLIA) 60 MG/ML SOSY injection Inject 60 mg into the skin every 6 (six) months.    ? ELIQUIS 5 MG TABS tablet TAKE ONE (1) TABLET BY MOUTH TWO (2) TIMES DAILY 60 tablet 5  ? famotidine-calcium carbonate-magnesium hydroxide (PEPCID COMPLETE) 10-800-165 MG chewable tablet Chew 1 tablet by mouth daily as needed.    ? folic acid (FOLVITE) 1 MG tablet TAKE ONE (1) TABLET BY MOUTH EACH DAY 90 tablet 3  ? loratadine (CLARITIN) 10 MG tablet Take 10 mg by mouth daily as needed for allergies.    ? Multiple Vitamin (MULITIVITAMIN WITH MINERALS) TABS Take 1 tablet by mouth daily.    ? nitroGLYCERIN (NITROSTAT) 0.4 MG SL tablet DISSOLVE 1 TABLET UNDER TONGUE EVERY 5 MINUTES FOR UP TO 3 DOSES AS NEEDED FOR CHEST PAIN 25 tablet 9  ? rosuvastatin (CRESTOR) 10 MG tablet TAKE 1 TABLET BY MOUTH DAILY 90 tablet 2  ? ?No current facility-administered medications on file prior to visit.  ? ? ?BP 120/65   Pulse 72   Resp 18   Ht _0  (1.88 m)   Wt 155 lb 12.8 oz (70.7 kg)   SpO2 98%   BMI 20.00 kg/m?  ?  ?   ?Objective:  ? Physical Exam ? ?General Appearance- Not in acute distress. ? ? ? ?Chest and Lung Exam ?Auscultation: ?Breath sounds:-Normal. Clear even and unlabored. ?Adventitious sounds:- No Adventitious sounds. ? ?Cardiovascular ?Auscultation:Rythm - Regular, rate and rythm. Heart Sounds -Normal heart sounds. ? ?Abdomen ?Inspection:-Inspection Normal.  ?Palpation/Perucssion: Palpation and Percussion of the abdomen reveal- Non  Tender, No Rebound tenderness, No rigidity(Guarding) and No Palpable abdominal masses.  ?Liver:-Normal.  ?Spleen:- Normal.  ? ?Back ?Mid lumbar spine tenderness to palpation and some left side paraspinal tenderness to pa

## 2022-04-03 ENCOUNTER — Encounter: Payer: Self-pay | Admitting: Emergency Medicine

## 2022-04-03 ENCOUNTER — Emergency Department (INDEPENDENT_AMBULATORY_CARE_PROVIDER_SITE_OTHER)
Admission: EM | Admit: 2022-04-03 | Discharge: 2022-04-03 | Disposition: A | Discharge status: Home / Self Care | Payer: Medicare Other | Source: Home / Self Care

## 2022-04-03 DIAGNOSIS — Z23 Encounter for immunization: Secondary | ICD-10-CM | POA: Diagnosis not present

## 2022-04-03 MED ORDER — TETANUS-DIPHTH-ACELL PERTUSSIS 5-2.5-18.5 LF-MCG/0.5 IM SUSY
0.5000 mL | PREFILLED_SYRINGE | Freq: Once | INTRAMUSCULAR | Status: AC
  Administered 2022-04-03: 0.5 mL via INTRAMUSCULAR

## 2022-04-03 NOTE — ED Notes (Signed)
Pt given tdap vaccine in left delt. Tolerated well.  ?

## 2022-04-03 NOTE — ED Triage Notes (Signed)
Pt here for tdap vaccine. No injury, last tdap was 2012 per immunization registry. States his PCP advised he come here for vaccine as they are out of stock. Denies previous allergies to vaccines and has no questions or concerns for provider.  ?

## 2022-04-05 NOTE — Telephone Encounter (Signed)
Prolia VOB initiated via [email or web address] ? ?Last Prolia inj 11/11/21 ?Next Prolia inj due 05/14/11 ? ?Prior Auth: APPROVED ?PA# [id number] ?Valid: 10/10/21-10/10/22 ?

## 2022-04-11 NOTE — Telephone Encounter (Signed)
Prior auth required for PROLIA  PA PROCESS DETAILS: Effective 11/27/2021 if the patient is new to Prolia, Prior authorization and Step Therapy are required & not on file. Please go to [email or web address] or call [phone] to initiate the prior authorization. For exception to the policy please visit [email or web address] and review Policy Number [id number] 

## 2022-04-18 ENCOUNTER — Ambulatory Visit: Payer: Medicare Other | Admitting: Internal Medicine

## 2022-04-24 NOTE — Telephone Encounter (Signed)
Pt ready for scheduling on or after 05/13/22  Out-of-pocket cost due at time of visit: $296  Primary: [facility] Medicare Prolia co-insurance: 20% (approximately $276) Admin fee co-insurance: $20  Secondary: n/a Prolia co-insurance:  Admin fee co-insurance:   Deductible: does not apply  Prior Auth: APPROVED PA# [id number] Valid: 10/10/21-10/10/22  ** This summary of benefits is an estimation of the patient's out-of-pocket cost. Exact cost may vary based on individual plan coverage.

## 2022-05-02 ENCOUNTER — Other Ambulatory Visit: Payer: Self-pay | Admitting: Cardiovascular Disease

## 2022-05-03 ENCOUNTER — Ambulatory Visit (INDEPENDENT_AMBULATORY_CARE_PROVIDER_SITE_OTHER): Payer: Medicare Other | Admitting: Internal Medicine

## 2022-05-03 ENCOUNTER — Ambulatory Visit (HOSPITAL_BASED_OUTPATIENT_CLINIC_OR_DEPARTMENT_OTHER)
Admission: RE | Admit: 2022-05-03 | Discharge: 2022-05-03 | Disposition: A | Discharge status: Home / Self Care | Payer: Medicare Other | Source: Ambulatory Visit | Attending: Internal Medicine | Admitting: Internal Medicine

## 2022-05-03 ENCOUNTER — Encounter: Payer: Self-pay | Admitting: Internal Medicine

## 2022-05-03 VITALS — BP 122/60 | HR 75 | Temp 98.0°F | Resp 18 | Ht 74.0 in | Wt 151.1 lb

## 2022-05-03 DIAGNOSIS — R9389 Abnormal findings on diagnostic imaging of other specified body structures: Secondary | ICD-10-CM | POA: Diagnosis not present

## 2022-05-03 DIAGNOSIS — R911 Solitary pulmonary nodule: Secondary | ICD-10-CM | POA: Diagnosis not present

## 2022-05-03 DIAGNOSIS — R739 Hyperglycemia, unspecified: Secondary | ICD-10-CM | POA: Diagnosis not present

## 2022-05-03 DIAGNOSIS — E782 Mixed hyperlipidemia: Secondary | ICD-10-CM | POA: Diagnosis not present

## 2022-05-03 DIAGNOSIS — R634 Abnormal weight loss: Secondary | ICD-10-CM

## 2022-05-03 DIAGNOSIS — I25810 Atherosclerosis of coronary artery bypass graft(s) without angina pectoris: Secondary | ICD-10-CM | POA: Diagnosis not present

## 2022-05-03 DIAGNOSIS — K219 Gastro-esophageal reflux disease without esophagitis: Secondary | ICD-10-CM

## 2022-05-03 LAB — COMPREHENSIVE METABOLIC PANEL
ALT: 14 U/L (ref 0–53)
AST: 22 U/L (ref 0–37)
Albumin: 4.5 g/dL (ref 3.5–5.2)
Alkaline Phosphatase: 44 U/L (ref 39–117)
BUN: 20 mg/dL (ref 6–23)
CO2: 30 mEq/L (ref 19–32)
Calcium: 9.9 mg/dL (ref 8.4–10.5)
Chloride: 99 mEq/L (ref 96–112)
Creatinine, Ser: 0.75 mg/dL (ref 0.40–1.50)
GFR: 79.96 mL/min (ref >60.00)
Glucose, Bld: 89 mg/dL (ref 70–99)
Potassium: 5.5 mEq/L — ABNORMAL HIGH (ref 3.5–5.1)
Sodium: 137 mEq/L (ref 135–145)
Total Bilirubin: 0.6 mg/dL (ref 0.2–1.2)
Total Protein: 7 g/dL (ref 6.0–8.3)

## 2022-05-03 LAB — CBC WITH DIFFERENTIAL/PLATELET
Basophils Absolute: 0.1 10*3/uL (ref 0.0–0.1)
Basophils Relative: 1.1 % (ref 0.0–3.0)
Eosinophils Absolute: 0.3 10*3/uL (ref 0.0–0.7)
Eosinophils Relative: 3.6 % (ref 0.0–5.0)
HCT: 41.8 % (ref 39.0–52.0)
Hemoglobin: 13.7 g/dL (ref 13.0–17.0)
Lymphocytes Relative: 19.6 % (ref 12.0–46.0)
Lymphs Abs: 1.6 10*3/uL (ref 0.7–4.0)
MCHC: 32.8 g/dL (ref 30.0–36.0)
MCV: 95.1 fl (ref 78.0–100.0)
Monocytes Absolute: 0.6 10*3/uL (ref 0.1–1.0)
Monocytes Relative: 7.9 % (ref 3.0–12.0)
Neutro Abs: 5.5 10*3/uL (ref 1.4–7.7)
Neutrophils Relative %: 67.8 % (ref 43.0–77.0)
Platelets: 185 10*3/uL (ref 150.0–400.0)
RBC: 4.39 Mil/uL (ref 4.22–5.81)
RDW: 15.8 % — ABNORMAL HIGH (ref 11.5–15.5)
WBC: 8.1 10*3/uL (ref 4.0–10.5)

## 2022-05-03 LAB — LIPID PANEL
Cholesterol: 112 mg/dL (ref 0–200)
HDL: 67.6 mg/dL (ref >39.00)
LDL Cholesterol: 30 mg/dL (ref 0–99)
NonHDL: 44.11
Total CHOL/HDL Ratio: 2
Triglycerides: 72 mg/dL (ref 0.0–149.0)
VLDL: 14.4 mg/dL (ref 0.0–40.0)

## 2022-05-03 LAB — TSH: TSH: 2.66 u[IU]/mL (ref 0.35–5.50)

## 2022-05-03 LAB — HEMOGLOBIN A1C: Hgb A1c MFr Bld: 6.2 % (ref 4.6–6.5)

## 2022-05-03 LAB — T3, FREE: T3, Free: 3.6 pg/mL (ref 2.3–4.2)

## 2022-05-03 LAB — T4, FREE: Free T4: 0.86 ng/dL (ref 0.60–1.60)

## 2022-05-03 MED ORDER — PANTOPRAZOLE SODIUM 40 MG PO TBEC: 40.0000 mg | DELAYED_RELEASE_TABLET | Freq: Two times a day (BID) | ORAL | 3 refills | Status: DC

## 2022-05-03 NOTE — Patient Instructions (Addendum)
Recommend to proceed with covid booster (bivalent) at your pharmacy.   Okay to take melatonin over-the-counter approximately 8 mg every night to help with the sleep.  Stop Pepcid  Start pantoprazole 40 mg 1 tablet twice daily before breakfast and before dinner.   Check the  blood pressure regularly BP GOAL is between 110/65 and  135/85. If it is consistently higher or lower, let me know  Consider seeing a counselor    GO TO THE LAB : Get the blood work     [place], [place] back for a checkup in 6 weeks   STOP BY THE FIRST FLOOR:  get the XR

## 2022-05-03 NOTE — Progress Notes (Signed)
Subjective:    Patient ID: [name], male    DOB: [birth year], 88 y.o.   MRN: [id number]  DOS:  05/03/2022 Type of visit - description: f/u  Routine follow-up, has multiple concerns. Has a long history of difficulty swallowing solids (like bread).  Slightly more noticeable lately. He denies cough when eating. No nausea vomiting.  No diarrhea.  No blood in the stools.  No abdominal pain. Admits to occasional heartburn.  Takes Pepcid OTC.  Has left temple  skin lesion  for several months   Has noted weight loss in the last 6 months. He denies night sweats, cough, headaches. Appetite is slightly decreased but has no postprandial abdominal pain.  He is in  a friendship with a lady and that may him sometimes nervous but denies depression or  suicidal ideas  Wt Readings from Last 3 Encounters:  05/03/22 151 lb 2 oz (68.5 kg)  03/30/22 155 lb 12.8 oz (70.7 kg)  01/02/22 161 lb 6.4 oz (73.2 kg)    Review of Systems See above   Past Medical History:  Diagnosis Date   Anxiety    Atrial fibrillation ([place])    Blood transfusion without reported diagnosis    CAD    a. 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 // Myoview 9/22: EF 57, normal perfusion; low risk   Cataract    Chronic abdominal pain    Chronic nausea    Coronary artery disease    Diverticulosis 2011   Diverticulitis 2004   Dyspepsia    Esophageal reflux    GERD (gastroesophageal reflux disease)    Headache    saw neuro 4-16, MRI done   History of echocardiogram    Echo 9/22: EF 60-65, no RWMA, normal diastolic function, GLS -83.7, normal RVSF, normal PASP, mild-moderate BAE, trivial MR, moderate MAC, moderate AV calcification, mild AI, mild aortic stenosis (mean gradient 5 mmHg, V-max 158 cm/s, DI 0.62), mild dilation of ascending aorta (40 mm)   HYPERPLASIA, PROSTATE NOS W/URINARY OBST/LUTS    IBS    INGUINAL HERNIA    Lichen planus [id number]    [name]   LUMBAR RADICULOPATHY, RIGHT    Mixed hyperlipidemia    OSTEOPOROSIS    PONV (postoperative nausea and vomiting)    PREMATURE VENTRICULAR CONTRACTIONS    a. After CABG - was on Coumadin/Multaq for a period of time. Coumadin discontinued 04/2010 after maintaining NSR.   UNSPECIFIED ANEMIA     Past Surgical History:  Procedure Laterality Date   BIOPSY  09/10/2018   Procedure: BIOPSY;  Surgeon: Jerene Bears, MD;  Location: [facility];  Service: Gastroenterology;;   CATARACT EXTRACTION Right 10/06/2013   CORONARY ARTERY BYPASS GRAFT     2 VD;anomalous RCA;post op compliacted by AF   ESOPHAGOGASTRODUODENOSCOPY (EGD) WITH PROPOFOL N/A 09/10/2018   Procedure: ESOPHAGOGASTRODUODENOSCOPY (EGD) WITH PROPOFOL;  Surgeon: Jerene Bears, MD;  Location: [facility];  Service: Gastroenterology;  Laterality: N/A;   HIP FRACTURE SURGERY Right 03/2007   Dr. Salvadore Farber   INGUINAL HERNIA REPAIR Left 2012   Dr Brantley Stage   KNEE SURGERY Right 1977   repair   PROSTATE BIOPSY  09/19/2013   [facility]    Current Outpatient Medications  Medication Instructions   acetaminophen (TYLENOL) 325-650 mg, Oral, Every 8 hours PRN   aspirin EC 81 mg, Oral, Daily   calcium carbonate (TUMS - DOSED IN MG  ELEMENTAL CALCIUM) 500 MG chewable tablet 1 tablet, Oral, Daily   denosumab (PROLIA) 60 mg, Subcutaneous, Every 6 months   ELIQUIS 5 MG TABS tablet TAKE ONE (1) TABLET BY MOUTH TWO (2) TIMES DAILY   famotidine-calcium carbonate-magnesium hydroxide (PEPCID COMPLETE) 10-800-165 MG chewable tablet 1 tablet, Oral, Daily PRN   folic acid (FOLVITE) 1 MG tablet TAKE ONE (1) TABLET BY MOUTH EACH DAY   loratadine (CLARITIN) 10 mg, Oral, Daily PRN   Multiple Vitamin (MULITIVITAMIN WITH MINERALS) TABS 1 tablet, Oral, Daily,     nitroGLYCERIN (NITROSTAT) 0.4 MG SL tablet DISSOLVE 1 TABLET UNDER TONGUE EVERY 5 MINUTES FOR UP TO 3 DOSES AS NEEDED FOR CHEST PAIN   pantoprazole (PROTONIX) 40 mg, Oral, 2 times  daily before meals   rosuvastatin (CRESTOR) 10 MG tablet TAKE 1 TABLET BY MOUTH DAILY   Vitamin D-3 1,000 Units, Oral, Daily with breakfast       Objective:   Physical Exam BP 122/60   Pulse 75   Temp 98 F (36.7 C) (Oral)   Resp 18   Ht _0  (1.88 m)   Wt 151 lb 2 oz (68.5 kg)   SpO2 99%   BMI 19.40 kg/m  General:   Well developed, NAD, BMI noted.  HEENT:  Normocephalic . Face symmetric, atraumatic. Neck: No thyromegaly Lungs:  CTA B Normal respiratory effort, no intercostal retractions, no accessory muscle use. Heart: RRR,  no murmur.  Abdomen:  Not distended, soft, non-tender. No rebound or rigidity.  No organomegaly direct and Skin: Not pale. Not jaundice Lower extremities: no pretibial edema bilaterally  Neurologic:  alert & oriented X3.  Speech normal, gait appropriate for age and unassisted Psych--  Cognition and judgment appear intact.  Cooperative with normal attention span and concentration.  Behavior appropriate. No anxious or depressed appearing.     Assessment     Assessment Prediabetes, A1c 6.0 (2015) Hyperlipidemia Anxiety, depression:  -intol to zoloft, Rx lexapro 5 mg 07-2015: intolerant d/t nausea  -lost wife July 2017 CV: ---CAD--CABG 2011 ----P- Atrial fibrillation, PVCs.  Elliquis ; Multaq d/c d/t GI s/e ----Palpable Ao x years, [place] (-) for AAA 2005 COPD (per CXR 06-2015), PFTs 10-04-15 mild obstruction DJD  Gait-- uses a walker prn GI: --GERD, IBS, diverticulosis --Chronic nausea: 2019.  Work-up: 08-2018: Negative EGD, CT  and US abdomen. 12/2018 wnl Gastric empty stomach.  Symptoms decreased after Multaq stopped GU: elevated PSA, BPH , (-) bx 2014 Osteoporosis: -hip FX 2008 - dexa ~2005 --> rx fosamax, took x 1 year, dexa ~2010 was rec no fosamax at that time. Dexa 09-2015: T score -2.4 --> rx fosamax, d/c d/t jaw pain 06-2017; took prolia #25 February 2018; T score (-) 30 December 1738, per Endo Lichen planus: Well-controlled per last  FLP C Headache -- saw neurology 02-2015, had a MRI/MRA (-) COPD: PFTs 08-2020: Mild obstruction with some air trapping.  Insignificant response to bronchodilators.   PLAN Prediabetes: Check A1c Hyperlipidemia: On Crestor, check FLP. Anxiety depression: Currently on no medications, occasionally anxious , sxs triggered by entering in a friendship relationship w/  a lady.   Listening therapy provided, recommend to talk with a counselor if needed. He also reports insomnia, wonders if melatonin is a good idea (yes).   CAD, A-fib Saw cardiology 01/02/2022, felt to be stable.  Check labs. GERD: Mild dysphagia to solids, chronic issue, slightly more noticeable lately, no red flags (other than weight loss, see next).  On chart review he had a  EGD in October 2019, essentially normal with a benign biopsy.  Plan: Stop Pepcid OTC, RX Protonix twice daily.  Reassess in 6-week Weight loss: For the last few months, approximately 10 pounds.  Review of systems negative except for slightly worse dysphagia.  We will check TFTs, chest x-ray, and CBC.  Reassess in 6 weeks. Skin lesion: Has a skin lesion on the left temple for the last few months, encouraged to keep the appointment he has with dermatology next month. Osteoporosis: Saw endocrinology 12/14/2021, was noted to be tolerating Prolia well.  Vitamin D was normal, recommend to continue present care RTC 6 weeks

## 2022-05-03 NOTE — Assessment & Plan Note (Signed)
Prediabetes: Check A1c Hyperlipidemia: On Crestor, check FLP. Anxiety depression: Currently on no medications, occasionally anxious , sxs triggered by entering in a friendship relationship w/  a lady.   Listening therapy provided, recommend to talk with a counselor if needed. He also reports insomnia, wonders if melatonin is a good idea (yes).   CAD, A-fib Saw cardiology 01/02/2022, felt to be stable.  Check labs. GERD: Mild dysphagia to solids, chronic issue, slightly more noticeable lately, no red flags (other than weight loss, see next).  On chart review he had a EGD in October 2019, essentially normal with a benign biopsy.  Plan: Stop Pepcid OTC, RX Protonix twice daily.  Reassess in 6-week Weight loss: For the last few months, approximately 10 pounds.  Review of systems negative except for slightly worse dysphagia.  We will check TFTs, chest x-ray, and CBC.  Reassess in 6 weeks. Skin lesion: Has a skin lesion on the left temple for the last few months, encouraged to keep the appointment he has with dermatology next month. Osteoporosis: Saw endocrinology 12/14/2021, was noted to be tolerating Prolia well.  Vitamin D was normal, recommend to continue present care RTC 6 weeks

## 2022-05-05 NOTE — Addendum Note (Signed)
Addended byDamita Dunnings D on: 05/05/2022 08:41 AM   Modules accepted: Orders

## 2022-05-17 ENCOUNTER — Ambulatory Visit: Payer: Medicare Other | Admitting: Family Medicine

## 2022-05-18 ENCOUNTER — Encounter (HOSPITAL_BASED_OUTPATIENT_CLINIC_OR_DEPARTMENT_OTHER): Payer: Self-pay

## 2022-05-18 ENCOUNTER — Ambulatory Visit (HOSPITAL_BASED_OUTPATIENT_CLINIC_OR_DEPARTMENT_OTHER)
Admission: RE | Admit: 2022-05-18 | Discharge: 2022-05-18 | Disposition: A | Discharge status: Home / Self Care | Payer: Medicare Other | Source: Ambulatory Visit | Attending: Internal Medicine | Admitting: Internal Medicine

## 2022-05-18 DIAGNOSIS — J449 Chronic obstructive pulmonary disease, unspecified: Secondary | ICD-10-CM | POA: Diagnosis not present

## 2022-05-18 DIAGNOSIS — R634 Abnormal weight loss: Secondary | ICD-10-CM | POA: Diagnosis not present

## 2022-05-18 DIAGNOSIS — R9389 Abnormal findings on diagnostic imaging of other specified body structures: Secondary | ICD-10-CM | POA: Diagnosis not present

## 2022-05-18 DIAGNOSIS — R911 Solitary pulmonary nodule: Secondary | ICD-10-CM | POA: Diagnosis not present

## 2022-05-18 MED ORDER — IOHEXOL 300 MG/ML  SOLN
100.0000 mL | Freq: Once | INTRAMUSCULAR | Status: AC | PRN
  Administered 2022-05-18: 80 mL via INTRAVENOUS

## 2022-05-22 ENCOUNTER — Telehealth: Payer: Self-pay

## 2022-05-23 ENCOUNTER — Ambulatory Visit (INDEPENDENT_AMBULATORY_CARE_PROVIDER_SITE_OTHER): Payer: Medicare Other | Admitting: Medical

## 2022-05-23 ENCOUNTER — Ambulatory Visit: Payer: Medicare Other | Admitting: Family Medicine

## 2022-05-23 VITALS — BP 132/76 | HR 78 | Temp 97.8°F | Resp 18 | Ht 74.0 in | Wt 150.0 lb

## 2022-05-23 DIAGNOSIS — E875 Hyperkalemia: Secondary | ICD-10-CM

## 2022-05-23 DIAGNOSIS — R197 Diarrhea, unspecified: Secondary | ICD-10-CM | POA: Diagnosis not present

## 2022-05-23 DIAGNOSIS — K219 Gastro-esophageal reflux disease without esophagitis: Secondary | ICD-10-CM | POA: Diagnosis not present

## 2022-05-23 LAB — COMPREHENSIVE METABOLIC PANEL
ALT: 14 U/L (ref 0–53)
AST: 21 U/L (ref 0–37)
Albumin: 4.3 g/dL (ref 3.5–5.2)
Alkaline Phosphatase: 41 U/L (ref 39–117)
BUN: 14 mg/dL (ref 6–23)
CO2: 27 mEq/L (ref 19–32)
Calcium: 9.6 mg/dL (ref 8.4–10.5)
Chloride: 101 mEq/L (ref 96–112)
Creatinine, Ser: 0.79 mg/dL (ref 0.40–1.50)
GFR: 78.69 mL/min (ref >60.00)
Glucose, Bld: 93 mg/dL (ref 70–99)
Potassium: 5.1 mEq/L (ref 3.5–5.1)
Sodium: 137 mEq/L (ref 135–145)
Total Bilirubin: 0.6 mg/dL (ref 0.2–1.2)
Total Protein: 6.9 g/dL (ref 6.0–8.3)

## 2022-05-23 LAB — CBC WITH DIFFERENTIAL/PLATELET
Basophils Absolute: 0.1 10*3/uL (ref 0.0–0.1)
Basophils Relative: 1 % (ref 0.0–3.0)
Eosinophils Absolute: 0.2 10*3/uL (ref 0.0–0.7)
Eosinophils Relative: 3.6 % (ref 0.0–5.0)
HCT: 41.7 % (ref 39.0–52.0)
Hemoglobin: 13.7 g/dL (ref 13.0–17.0)
Lymphocytes Relative: 20.4 % (ref 12.0–46.0)
Lymphs Abs: 1.4 10*3/uL (ref 0.7–4.0)
MCHC: 32.8 g/dL (ref 30.0–36.0)
MCV: 95.4 fl (ref 78.0–100.0)
Monocytes Absolute: 0.9 10*3/uL (ref 0.1–1.0)
Monocytes Relative: 12.5 % — ABNORMAL HIGH (ref 3.0–12.0)
Neutro Abs: 4.3 10*3/uL (ref 1.4–7.7)
Neutrophils Relative %: 62.5 % (ref 43.0–77.0)
Platelets: 178 10*3/uL (ref 150.0–400.0)
RBC: 4.37 Mil/uL (ref 4.22–5.81)
RDW: 15.3 % (ref 11.5–15.5)
WBC: 6.9 10*3/uL (ref 4.0–10.5)

## 2022-05-24 ENCOUNTER — Other Ambulatory Visit: Payer: Medicare Other

## 2022-05-24 DIAGNOSIS — R197 Diarrhea, unspecified: Secondary | ICD-10-CM | POA: Diagnosis not present

## 2022-05-26 LAB — CLOSTRIDIUM DIFFICILE BY PCR: Toxigenic C. Difficile by PCR: NEGATIVE

## 2022-05-28 LAB — STOOL CULTURE: E coli, Shiga toxin Assay: NEGATIVE

## 2022-05-31 ENCOUNTER — Ambulatory Visit (INDEPENDENT_AMBULATORY_CARE_PROVIDER_SITE_OTHER): Payer: Medicare Other | Admitting: Medical

## 2022-05-31 VITALS — BP 115/60 | HR 57 | Resp 18 | Ht 74.0 in | Wt 149.8 lb

## 2022-05-31 DIAGNOSIS — R197 Diarrhea, unspecified: Secondary | ICD-10-CM

## 2022-05-31 LAB — OVA AND PARASITE EXAMINATION
CONCENTRATE RESULT:: NONE SEEN
MICRO NUMBER:: 13583348
SPECIMEN QUALITY:: ADEQUATE
TRICHROME RESULT:: NONE SEEN

## 2022-05-31 NOTE — Patient Instructions (Addendum)
Diarrhea/loose stools resolved/much better. Stool appear to forming now per your report and you blood work and culture studies were negative.   You report in past for years chronic looser side stools. If that occurs on occasion can use immodium ad.  High potassium in past. Recent labs showed good k level.  Some mild fatigue reported. Will get b1, b1, tsh and t4.  Discussed prior pft and CT chest studies. No significant shortness of breath described. If you feel short of breath with minimal activity let us know.you 02 sat levels are very good.  Follow up with Dr. Larose Kells next month or sooner if needed.

## 2022-05-31 NOTE — Progress Notes (Signed)
Subjective:    Patient ID: [name], male    DOB: [birth year], 88 y.o.   MRN: [id number]  HPI Pt in stating he did get progressively better since last visit with me. Pt did start probiotics. He states mild soft loose stool yesterday. Last bm today. He states frequency of stool is less and more formed stool Appetite slowly improving.   No abdomen pain. Stool test that I ordered were negative.  Cbc and cmp were negative.  Pt has questions on prior pft testing and ct chest studies. Remote brief hx of smoking and some chemical exposure thru work per his report/description.     Review of Systems  Constitutional:  Positive for fatigue. Negative for chills and fever.  Respiratory:  Negative for cough, chest tightness, shortness of breath and wheezing.   Cardiovascular:  Negative for chest pain and palpitations.  Gastrointestinal:  Negative for abdominal pain.  Genitourinary:  Negative for enuresis, frequency, genital sores and urgency.  Musculoskeletal:  Negative for gait problem, myalgias and neck stiffness.    Past Medical History:  Diagnosis Date   Anxiety    Atrial fibrillation ([place])    Blood transfusion without reported diagnosis    CAD    a. 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 // Myoview 9/22: EF 57, normal perfusion; low risk   Cataract    Chronic abdominal pain    Chronic nausea    Coronary artery disease    Diverticulosis 2011   Diverticulitis 2004   Dyspepsia    Esophageal reflux    GERD (gastroesophageal reflux disease)    Headache    saw neuro 4-16, MRI done   History of echocardiogram    Echo 9/22: EF 60-65, no RWMA, normal diastolic function, GLS -19.4, normal RVSF, normal PASP, mild-moderate BAE, trivial MR, moderate MAC, moderate AV calcification, mild AI, mild aortic stenosis (mean gradient 5 mmHg, V-max 158 cm/s, DI 0.62), mild dilation of ascending aorta (40 mm)   HYPERPLASIA,  PROSTATE NOS W/URINARY OBST/LUTS    IBS    INGUINAL HERNIA    Lichen planus [id number]   [name]   LUMBAR RADICULOPATHY, RIGHT    Mixed hyperlipidemia    OSTEOPOROSIS    PONV (postoperative nausea and vomiting)    PREMATURE VENTRICULAR CONTRACTIONS    a. After CABG - was on Coumadin/Multaq for a period of time. Coumadin discontinued 04/2010 after maintaining NSR.   UNSPECIFIED ANEMIA      Social History   Socioeconomic History   Marital status: Widowed    Spouse name: Not on file   Number of children: 0   Years of education: Not on file   Highest education level: Not on file  Occupational History   Occupation: retired    Fish farm manager: RETIRED  Tobacco Use   Smoking status: Former    Types: Cigarettes    Quit date: 11/27/1962    Years since quitting: 59.5   Smokeless tobacco: Never  Vaping Use   Vaping Use: Never used  Substance and Sexual Activity   Alcohol use: Not Currently    Alcohol/week: 0.0 standard drinks of alcohol    Comment: quit drinking    Drug use: No   Sexual activity: Not Currently  Other Topics Concern   Not on file  Social History Narrative   Lives at [facility] in independent living.     Lost wife 06-2016.  Has no children.  Family: nephews-nices in [place]   Retired from [employer].     Still drives.    Social Determinants of Health   Financial Resource Strain: Low Risk  (10/31/2021)   Overall Financial Resource Strain (CARDIA)    Difficulty of Paying Living Expenses: Not very hard  Food Insecurity: No Food Insecurity (07/20/2021)   Hunger Vital Sign    Worried About Running Out of Food in the Last Year: Never true    Ran Out of Food in the Last Year: Never true  Transportation Needs: No Transportation Needs (07/20/2021)   PRAPARE - Hydrologist (Medical): No    Lack of Transportation (Non-Medical): No  Physical Activity: Sufficiently Active (05/04/2021)   Exercise Vital Sign    Days of Exercise per Week: 7 days     Minutes of Exercise per Session: 40 min  Stress: No Stress Concern Present (07/20/2021)   [place]    Feeling of Stress : Not at all  Social Connections: Moderately Isolated (07/20/2021)   Social Connection and Isolation Panel [NHANES]    Frequency of Communication with Friends and Family: More than three times a week    Frequency of Social Gatherings with Friends and Family: More than three times a week    Attends Religious Services: More than 4 times per year    Active Member of [employer] or Organizations: No    Attends Archivist Meetings: Never    Marital Status: Widowed  Intimate Partner Violence: Not At Risk (07/20/2021)   Humiliation, Afraid, Rape, and Kick questionnaire    Fear of Current or Ex-Partner: No    Emotionally Abused: No    Physically Abused: No    Sexually Abused: No    Past Surgical History:  Procedure Laterality Date   BIOPSY  09/10/2018   Procedure: BIOPSY;  Surgeon: Jerene Bears, MD;  Location: [facility];  Service: Gastroenterology;;   CATARACT EXTRACTION Right 10/06/2013   CORONARY ARTERY BYPASS GRAFT     2 VD;anomalous RCA;post op compliacted by AF   ESOPHAGOGASTRODUODENOSCOPY (EGD) WITH PROPOFOL N/A 09/10/2018   Procedure: ESOPHAGOGASTRODUODENOSCOPY (EGD) WITH PROPOFOL;  Surgeon: Jerene Bears, MD;  Location: [facility];  Service: Gastroenterology;  Laterality: N/A;   HIP FRACTURE SURGERY Right 03/2007   Dr. Salvadore Farber   INGUINAL HERNIA REPAIR Left 2012   Dr Brantley Stage   KNEE SURGERY Right 1977   repair   [place]  09/19/2013   [facility]    Family History  Problem Relation Age of Onset   Stroke Mother    Pancreatic cancer Father        Deceased, 38   Breast cancer Sister        Deceased   Heart disease Sister 39   Stroke Maternal Aunt    Colon cancer Neg Hx    Prostate cancer Neg Hx    Esophageal cancer Neg Hx    Rectal cancer Neg Hx    Stomach  cancer Neg Hx     Allergies  Allergen Reactions   Amoxicillin Diarrhea, Nausea And Vomiting and Other (See Comments)    Patient questions this   Zoloft [Sertraline Hcl]     Patient didn't like the way it made him feel   Remeron [Mirtazapine] Other (See Comments)    Caused excessive drowsiness    Current Outpatient Medications on File Prior to Visit  Medication Sig Dispense Refill  acetaminophen (TYLENOL) 650 MG CR tablet Take 325-650 mg by mouth every 8 (eight) hours as needed for pain.     aspirin EC 81 MG tablet Take 81 mg by mouth daily.     calcium carbonate (TUMS - DOSED IN MG ELEMENTAL CALCIUM) 500 MG chewable tablet Chew 1 tablet by mouth daily.     Cholecalciferol (VITAMIN D-3) 25 MCG (1000 UT) CAPS Take 1,000 Units by mouth daily with breakfast.     denosumab (PROLIA) 60 MG/ML SOSY injection Inject 60 mg into the skin every 6 (six) months.     ELIQUIS 5 MG TABS tablet TAKE ONE (1) TABLET BY MOUTH TWO (2) TIMES DAILY 60 tablet 5   famotidine-calcium carbonate-magnesium hydroxide (PEPCID COMPLETE) 10-800-165 MG chewable tablet Chew 1 tablet by mouth daily as needed.     folic acid (FOLVITE) 1 MG tablet TAKE ONE (1) TABLET BY MOUTH EACH DAY 90 tablet 2   loratadine (CLARITIN) 10 MG tablet Take 10 mg by mouth daily as needed for allergies.     Multiple Vitamin (MULITIVITAMIN WITH MINERALS) TABS Take 1 tablet by mouth daily.     nitroGLYCERIN (NITROSTAT) 0.4 MG SL tablet DISSOLVE 1 TABLET UNDER TONGUE EVERY 5 MINUTES FOR UP TO 3 DOSES AS NEEDED FOR CHEST PAIN 25 tablet 9   pantoprazole (PROTONIX) 40 MG tablet Take 1 tablet (40 mg total) by mouth 2 (two) times daily before a meal. 60 tablet 3   rosuvastatin (CRESTOR) 10 MG tablet TAKE 1 TABLET BY MOUTH DAILY 90 tablet 2   No current facility-administered medications on file prior to visit.    BP 115/60   Pulse (!) 57   Resp 18   Ht _0  (1.88 m)   Wt 149 lb 12.8 oz (67.9 kg)   SpO2 98%   BMI 19.23 kg/m    .      Objective:   Physical Exam  General- No acute distress. Pleasant patient. Neck- Full range of motion, no jvd Lungs- Clear, even and unlabored. Heart- regular rate and rhythm. Neurologic- CNII- XII grossly intact.       Assessment & Plan:   Patient Instructions  Diarrhea/loose stools resolved/much better. Stool appear to forming now per your report and you blood work and culture studies were negative.   You report in past for years chronic looser side stools. If that occurs on occasion can use immodium ad.  High potassium in past. Recent labs showed good k level.  Some mild fatigue reported. Will get b1, b1, tsh and t4.  Discussed prior pft and CT chest studies. No significant shortness of breath described. If you feel short of breath with minimal activity let us know.you 02 sat levels are very good.  Follow up with Dr. Larose Kells next month or sooner if needed.   Mackie Pai, PA-C

## 2022-06-08 ENCOUNTER — Ambulatory Visit (INDEPENDENT_AMBULATORY_CARE_PROVIDER_SITE_OTHER): Payer: Medicare Other | Admitting: Internal Medicine

## 2022-06-08 ENCOUNTER — Telehealth: Payer: Self-pay

## 2022-06-08 ENCOUNTER — Encounter: Payer: Self-pay | Admitting: Internal Medicine

## 2022-06-08 VITALS — BP 110/60 | HR 71 | Temp 97.7°F | Resp 18 | Ht 74.0 in | Wt 148.6 lb

## 2022-06-08 DIAGNOSIS — R634 Abnormal weight loss: Secondary | ICD-10-CM

## 2022-06-08 DIAGNOSIS — K219 Gastro-esophageal reflux disease without esophagitis: Secondary | ICD-10-CM | POA: Diagnosis not present

## 2022-06-08 DIAGNOSIS — R197 Diarrhea, unspecified: Secondary | ICD-10-CM

## 2022-06-08 NOTE — Telephone Encounter (Signed)
Nurse Assessment Nurse: Micki Riley, RN, Domenick Gong Date/Time (Eastern Time): 06/08/2022 7:02:03 AM Confirm and document reason for call. If symptomatic, describe symptoms. ---Caller states he has been having diarrhea, seen MD 2x, (comes/goes, better at times/not others). States he had gnawing abdominal pain yesterday (not now). Denies known fever, abx, or any other symptoms. Does the patient have any new or worsening symptoms? ---Yes Will a triage be completed? ---Yes Related visit to physician within the last 2 weeks? ---Yes Does the PT have any chronic conditions? (i.e. diabetes, asthma, this includes High risk factors for pregnancy, etc.) ---Unknown Is this a behavioral health or substance abuse call? ---No Guidelines Guideline Title Affirmed Question Affirmed Notes Nurse Date/Time (Eastern Time) Diarrhea [1] SEVERE diarrhea (e.g., 7 or more times / day more than normal) AND [2] age > 2 years Cazares, [place], Domenick Gong 06/08/2022 7:04:57 AM Disp. Time Eilene Ghazi Time) Disposition Final User 06/08/2022 7:10:21 AM See HCP within 4 Hours (or PCP triage) Yes Cazares, RN, Domenick Gong PLEASE NOTE: All timestamps contained within this report are represented as [place] Standard Time. CONFIDENTIALTY NOTICE: This fax transmission is intended only for the addressee. It contains information that is legally privileged, confidential or otherwise protected from use or disclosure. If you are not the intended recipient, you are strictly prohibited from reviewing, disclosing, copying using or disseminating any of this information or taking any action in reliance on or regarding this information. If you have received this fax in error, please notify us immediately by telephone so that we can arrange for its return to [place]. Phone: [phone], Toll-Free: [phone], Fax: [phone] Page: 2 of 2 Call Id: [id number] Final Disposition 06/08/2022 7:10:21 AM See HCP within 4 Hours (or PCP  triage) Yes Cazares, RN, [place] Disagree/Comply Comply Caller Understands Yes PreDisposition Did not know what to do Care Advice Given Per Guideline SEE HCP (OR PCP TRIAGE) WITHIN 4 HOURS: CARE ADVICE given per Diarrhea (Adult) guideline. CALL BACK IF: * You become worse CLEAR FLUIDS: * Sip water or a half-strength sports drink (e.g., Gatorade, Powerade; mix half and half with water). * Other options: An oral rehydration solution (e.g., Pedialyte, Rehydralyte). * Drink more fluids. Referrals REFERRED TO PCP OFFICE

## 2022-06-08 NOTE — Telephone Encounter (Signed)
Appt w/ PCP today.  

## 2022-06-08 NOTE — Patient Instructions (Addendum)
Drink plenty fluids  Take over-the-counter probiotics 1 time a day  Avoid excessive fruits and vegetables until your stools are back to normal  If you are not gradually better let me know  Next visit in 3 months, please make an appointment

## 2022-06-08 NOTE — Progress Notes (Addendum)
Subjective:    Patient ID: [name], male    DOB: Feb 08, 1930, 88 y.o.   MRN: [id number]  DOS:  06/08/2022 Type of visit - description: Acute  Had a sudden onset of diarrhea 05/05/2022.  Was seen by Percell Miller PA 05-23-22: Work-up: CBC, C diff, CMP essentially normal. Stools for ova and parasite , culture negative . No  Campylobacter, Salmonella and Shigella  Was seen on follow-up 05/31/2022.  At the time he was improving.  He started to take probiotics.  Since then, he still has loose stools some days and  soft stools other days but they have not become normal/solid. He did have some lower abdominal cramps in the last few days.  No fever chills Appetite was a slightly decreased but is improving. No nausea vomiting or blood in the stools. No LUTS.      Wt Readings from Last 3 Encounters:  06/08/22 148 lb 9.6 oz (67.4 kg)  05/31/22 149 lb 12.8 oz (67.9 kg)  05/23/22 150 lb (68 kg)     Review of Systems See above   Past Medical History:  Diagnosis Date   Anxiety    Atrial fibrillation ([place])    Blood transfusion without reported diagnosis    CAD    a. 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 // Myoview 9/22: EF 57, normal perfusion; low risk   Cataract    Chronic abdominal pain    Chronic nausea    Coronary artery disease    Diverticulosis 2011   Diverticulitis 2004   Dyspepsia    Esophageal reflux    GERD (gastroesophageal reflux disease)    Headache    saw neuro 4-16, MRI done   History of echocardiogram    Echo 9/22: EF 60-65, no RWMA, normal diastolic function, GLS -59.5, normal RVSF, normal PASP, mild-moderate BAE, trivial MR, moderate MAC, moderate AV calcification, mild AI, mild aortic stenosis (mean gradient 5 mmHg, V-max 158 cm/s, DI 0.62), mild dilation of ascending aorta (40 mm)   HYPERPLASIA, PROSTATE NOS W/URINARY OBST/LUTS    IBS    INGUINAL HERNIA    Lichen planus [id number]   [name]   LUMBAR RADICULOPATHY, RIGHT    Mixed hyperlipidemia    OSTEOPOROSIS    PONV (postoperative nausea and vomiting)    PREMATURE VENTRICULAR CONTRACTIONS    a. After CABG - was on Coumadin/Multaq for a period of time. Coumadin discontinued 04/2010 after maintaining NSR.   UNSPECIFIED ANEMIA     Past Surgical History:  Procedure Laterality Date   BIOPSY  09/10/2018   Procedure: BIOPSY;  Surgeon: Jerene Bears, MD;  Location: [facility];  Service: Gastroenterology;;   CATARACT EXTRACTION Right 10/06/2013   CORONARY ARTERY BYPASS GRAFT     2 VD;anomalous RCA;post op compliacted by AF   ESOPHAGOGASTRODUODENOSCOPY (EGD) WITH PROPOFOL N/A 09/10/2018   Procedure: ESOPHAGOGASTRODUODENOSCOPY (EGD) WITH PROPOFOL;  Surgeon: Jerene Bears, MD;  Location: [facility];  Service: Gastroenterology;  Laterality: N/A;   HIP FRACTURE SURGERY Right 03/2007   Dr. Salvadore Farber   INGUINAL HERNIA REPAIR Left 2012   Dr Brantley Stage   KNEE SURGERY Right 1977   repair   PROSTATE BIOPSY  09/19/2013   [facility]    Current Outpatient Medications  Medication Instructions   acetaminophen (TYLENOL) 325-650 mg, Oral, Every 8 hours PRN   aspirin EC 81 mg, Oral, Daily   calcium carbonate (TUMS -  DOSED IN MG ELEMENTAL CALCIUM) 500 MG chewable tablet 1 tablet, Oral, Daily   denosumab (PROLIA) 60 mg, Subcutaneous, Every 6 months   ELIQUIS 5 MG TABS tablet TAKE ONE (1) TABLET BY MOUTH TWO (2) TIMES DAILY   famotidine-calcium carbonate-magnesium hydroxide (PEPCID COMPLETE) 10-800-165 MG chewable tablet 1 tablet, Oral, Daily PRN   folic acid (FOLVITE) 1 MG tablet TAKE ONE (1) TABLET BY MOUTH EACH DAY   loratadine (CLARITIN) 10 mg, Oral, Daily PRN   Multiple Vitamin (MULITIVITAMIN WITH MINERALS) TABS 1 tablet, Oral, Daily,     nitroGLYCERIN (NITROSTAT) 0.4 MG SL tablet DISSOLVE 1 TABLET UNDER TONGUE EVERY 5 MINUTES FOR UP TO 3 DOSES AS NEEDED FOR CHEST PAIN   pantoprazole (PROTONIX) 40 mg, Oral, 2 times daily  before meals   rosuvastatin (CRESTOR) 10 MG tablet TAKE 1 TABLET BY MOUTH DAILY   Vitamin D-3 1,000 Units, Oral, Daily with breakfast       Objective:   Physical Exam BP 110/60 (BP Location: Left Arm, Patient Position: Sitting, Cuff Size: Normal)   Pulse 71   Temp 97.7 F (36.5 C) (Oral)   Resp 18   Ht _0  (1.88 m)   Wt 148 lb 9.6 oz (67.4 kg)   SpO2 93%   BMI 19.08 kg/m  General:   Well developed, NAD, BMI noted.  HEENT:  Normocephalic . Face symmetric, atraumatic Abdomen:  Not distended, soft, non-tender. No rebound or rigidity.   Skin: Not pale. Not jaundice Lower extremities: no pretibial edema bilaterally  Neurologic:  alert & oriented X3.  Speech normal, gait appropriate for age and unassisted Psych--  Cognition and judgment appear intact.  Cooperative with normal attention span and concentration.  Behavior appropriate. No anxious or depressed appearing.     Assessment     Assessment Prediabetes, A1c 6.0 (2015) Hyperlipidemia Anxiety, depression:  -intol to zoloft, Rx lexapro 5 mg 07-2015: intolerant d/t nausea  -lost wife July 2017 CV: ---CAD--CABG 2011 ----P- Atrial fibrillation, PVCs.  Elliquis ; Multaq d/c d/t GI s/e ----Palpable Ao x years, [place] (-) for AAA 2005 COPD (per CXR 06-2015), PFTs 10-04-15 mild obstruction DJD  Gait-- uses a walker prn GI: --GERD, IBS, diverticulosis --Chronic nausea: 2019.  Work-up: 08-2018: Negative EGD, CT  and US abdomen. 12/2018 wnl Gastric empty stomach.  Symptoms decreased after Multaq stopped GU: elevated PSA, BPH , (-) bx 2014 Osteoporosis: -hip FX 2008 - dexa ~2005 --> rx fosamax, took x 1 year, dexa ~2010 was rec no fosamax at that time. Dexa 09-2015: T score -2.4 --> rx fosamax, d/c d/t jaw pain 06-2017; took prolia #25 February 2018; T score (-) 30 December 8364, per Endo Lichen planus: Well-controlled per last FLP C Headache -- saw neurology 02-2015, had a MRI/MRA (-) COPD: PFTs 08-2020: Mild obstruction with some  air trapping.  Insignificant response to bronchodilators.   PLAN Diarrhea: Sudden onset of diarrhea around 05/05/2022, this is a third visit for this issue, see HPI, work-up negative, he is better, still has diarrhea on and off and few abdominal cramps in the last few days.. Had a colonoscopy was 2012. Plan: Avoid excessive fruits and vegetables until back to normal. Probiotics 1 times daily (he is taking twice daily) Good hydration Call if not back to normal in 2 to 3 weeks.  Call if fever, chills, severe cramps, severe symptoms. Weight loss: See my last visit note, labs were okay, chest x-ray is slightly abnormal, follow-up by CT chest that showed  emphysema without other  concerning findings. Weight was 151 last month and now is 148.  Reassess on RTC GERD: Started Protonix few weeks ago, that is when the diarrhea as described above started.  Patient self DC Protonix and is only taking Pepcid as needed and reports it is working well for him. RTC 3 months

## 2022-06-09 NOTE — Assessment & Plan Note (Signed)
Diarrhea: Sudden onset of diarrhea around 05/05/2022, this is a third visit for this issue, see HPI, work-up negative, he is better, still has diarrhea on and off and few abdominal cramps in the last few days.. Had a colonoscopy was 2012. Plan: Avoid excessive fruits and vegetables until back to normal. Probiotics 1 times daily (he is taking twice daily) Good hydration Call if not back to normal in 2 to 3 weeks.  Call if fever, chills, severe cramps, severe symptoms. Weight loss: See my last visit note, labs were okay, chest x-ray is slightly abnormal, follow-up by CT chest that showed  emphysema without other concerning findings. Weight was 151 last month and now is 148.  Reassess on RTC GERD: Started Protonix few weeks ago, that is when the diarrhea as described above started.  Patient self DC Protonix and is only taking Pepcid as needed and reports it is working well for him. RTC 3 months

## 2022-06-15 DIAGNOSIS — Z85828 Personal history of other malignant neoplasm of skin: Secondary | ICD-10-CM | POA: Diagnosis not present

## 2022-06-15 DIAGNOSIS — L570 Actinic keratosis: Secondary | ICD-10-CM | POA: Diagnosis not present

## 2022-06-15 DIAGNOSIS — L814 Other melanin hyperpigmentation: Secondary | ICD-10-CM | POA: Diagnosis not present

## 2022-06-15 DIAGNOSIS — D2271 Melanocytic nevi of right lower limb, including hip: Secondary | ICD-10-CM | POA: Diagnosis not present

## 2022-06-15 DIAGNOSIS — B351 Tinea unguium: Secondary | ICD-10-CM | POA: Diagnosis not present

## 2022-06-15 DIAGNOSIS — L821 Other seborrheic keratosis: Secondary | ICD-10-CM | POA: Diagnosis not present

## 2022-06-15 DIAGNOSIS — D485 Neoplasm of uncertain behavior of skin: Secondary | ICD-10-CM | POA: Diagnosis not present

## 2022-06-16 ENCOUNTER — Telehealth: Payer: Self-pay | Admitting: Internal Medicine

## 2022-06-16 NOTE — Telephone Encounter (Signed)
Called and lvm for pt to call back to schedule overdue Prolia injection.

## 2022-06-16 NOTE — Telephone Encounter (Signed)
Patient is calling saying that he needs to schedule an appointment to get his denosumab denosumab (PROLIA) 60 MG/ML SOSY injection But that he has not received a telephone call that the medication has come in.  Patient is calling to check on the status of the injection.

## 2022-06-20 ENCOUNTER — Emergency Department (HOSPITAL_BASED_OUTPATIENT_CLINIC_OR_DEPARTMENT_OTHER): Payer: Medicare Other

## 2022-06-20 ENCOUNTER — Other Ambulatory Visit: Payer: Self-pay

## 2022-06-20 ENCOUNTER — Emergency Department (HOSPITAL_BASED_OUTPATIENT_CLINIC_OR_DEPARTMENT_OTHER)
Admission: EM | Admit: 2022-06-20 | Discharge: 2022-06-20 | Disposition: A | Discharge status: Home / Self Care | Payer: Medicare Other | Attending: Emergency Medicine | Admitting: Emergency Medicine

## 2022-06-20 ENCOUNTER — Encounter (HOSPITAL_BASED_OUTPATIENT_CLINIC_OR_DEPARTMENT_OTHER): Payer: Self-pay | Admitting: Emergency Medicine

## 2022-06-20 DIAGNOSIS — Z7982 Long term (current) use of aspirin: Secondary | ICD-10-CM | POA: Diagnosis not present

## 2022-06-20 DIAGNOSIS — R531 Weakness: Secondary | ICD-10-CM | POA: Diagnosis not present

## 2022-06-20 DIAGNOSIS — R059 Cough, unspecified: Secondary | ICD-10-CM | POA: Diagnosis not present

## 2022-06-20 DIAGNOSIS — R6889 Other general symptoms and signs: Secondary | ICD-10-CM | POA: Diagnosis not present

## 2022-06-20 DIAGNOSIS — Z79899 Other long term (current) drug therapy: Secondary | ICD-10-CM | POA: Diagnosis not present

## 2022-06-20 DIAGNOSIS — Z7901 Long term (current) use of anticoagulants: Secondary | ICD-10-CM | POA: Diagnosis not present

## 2022-06-20 DIAGNOSIS — R112 Nausea with vomiting, unspecified: Secondary | ICD-10-CM | POA: Diagnosis not present

## 2022-06-20 DIAGNOSIS — I700 Atherosclerosis of aorta: Secondary | ICD-10-CM | POA: Diagnosis not present

## 2022-06-20 DIAGNOSIS — R918 Other nonspecific abnormal finding of lung field: Secondary | ICD-10-CM | POA: Diagnosis not present

## 2022-06-20 DIAGNOSIS — U071 COVID-19: Secondary | ICD-10-CM | POA: Diagnosis not present

## 2022-06-20 DIAGNOSIS — R0981 Nasal congestion: Secondary | ICD-10-CM | POA: Diagnosis present

## 2022-06-20 DIAGNOSIS — Z743 Need for continuous supervision: Secondary | ICD-10-CM | POA: Diagnosis not present

## 2022-06-20 DIAGNOSIS — Z7401 Bed confinement status: Secondary | ICD-10-CM | POA: Diagnosis not present

## 2022-06-20 DIAGNOSIS — R110 Nausea: Secondary | ICD-10-CM | POA: Diagnosis not present

## 2022-06-20 LAB — SARS CORONAVIRUS 2 BY RT PCR: SARS Coronavirus 2 by RT PCR: POSITIVE — AB

## 2022-06-20 MED ORDER — ONDANSETRON 4 MG PO TBDP
8.0000 mg | ORAL_TABLET | Freq: Once | ORAL | Status: AC
  Administered 2022-06-20: 8 mg via ORAL
  Filled 2022-06-20: qty 2

## 2022-06-20 MED ORDER — MOLNUPIRAVIR EUA 200MG CAPSULE
4.0000 | ORAL_CAPSULE | Freq: Two times a day (BID) | ORAL | 0 refills | Status: AC
Start: 2022-06-20 — End: 2022-06-25

## 2022-06-20 NOTE — ED Provider Notes (Signed)
[facility]ENC[facility]MENT Provider Note   CSN: [id number] Arrival date & time: 06/20/22  0554     History  Chief Complaint  Patient presents with   Nausea    [name] is a 88 y.o. male.  The history is provided by the patient.  URI Presenting symptoms: congestion, cough and fever   Presenting symptoms comment:  Nausea  Severity:  Moderate Onset quality:  Sudden Duration: hours. Timing:  Sporadic Progression:  Unchanged Chronicity:  New Relieved by:  Nothing Worsened by:  Nothing Ineffective treatments:  None tried Associated symptoms: no arthralgias and no headaches   Risk factors: being elderly        Home Medications Prior to Admission medications   Medication Sig Start Date End Date Taking? Authorizing Provider  molnupiravir EUA (LAGEVRIO) 200 mg CAPS capsule Take 4 capsules (800 mg total) by mouth 2 (two) times daily for 5 days. 06/20/22 06/25/22 Yes Ariyana Faw, MD  acetaminophen (TYLENOL) 650 MG CR tablet Take 325-650 mg by mouth every 8 (eight) hours as needed for pain.    [provider]  aspirin EC 81 MG tablet Take 81 mg by mouth daily.    [provider]  calcium carbonate (TUMS - DOSED IN MG ELEMENTAL CALCIUM) 500 MG chewable tablet Chew 1 tablet by mouth daily.    [provider]  Cholecalciferol (VITAMIN D-3) 25 MCG (1000 UT) CAPS Take 1,000 Units by mouth daily with breakfast.    [provider]  denosumab (PROLIA) 60 MG/ML SOSY injection Inject 60 mg into the skin every 6 (six) months.    [provider]  ELIQUIS 5 MG TABS tablet TAKE ONE (1) TABLET BY MOUTH TWO (2) TIMES DAILY 03/07/22   Josue Hector, MD  famotidine-calcium carbonate-magnesium hydroxide (PEPCID COMPLETE) 10-800-165 MG chewable tablet Chew 1 tablet by mouth daily as needed.    [provider]  folic acid (FOLVITE) 1 MG tablet TAKE ONE (1) TABLET BY MOUTH EACH DAY 05/02/22   Josue Hector, MD  loratadine  (CLARITIN) 10 MG tablet Take 10 mg by mouth daily as needed for allergies.    [provider]  Multiple Vitamin (MULITIVITAMIN WITH MINERALS) TABS Take 1 tablet by mouth daily.    [provider]  nitroGLYCERIN (NITROSTAT) 0.4 MG SL tablet DISSOLVE 1 TABLET UNDER TONGUE EVERY 5 MINUTES FOR UP TO 3 DOSES AS NEEDED FOR CHEST PAIN 01/05/22   Josue Hector, MD  rosuvastatin (CRESTOR) 10 MG tablet TAKE 1 TABLET BY MOUTH DAILY 01/05/22   Josue Hector, MD      Allergies    Amoxicillin, Zoloft [sertraline hcl], and Remeron [mirtazapine]    Review of Systems   Review of Systems  Constitutional:  Positive for fever.  HENT:  Positive for congestion.   Respiratory:  Positive for cough. Negative for shortness of breath.   Cardiovascular:  Negative for chest pain and leg swelling.  Gastrointestinal:  Negative for abdominal pain.  Musculoskeletal:  Negative for arthralgias.  Neurological:  Negative for headaches.  All other systems reviewed and are negative.   Physical Exam Updated Vital Signs BP (!) 166/67 (BP Location: Right Arm)   Pulse 85   Temp 100 F (37.8 C) (Oral)   Resp (!) 26   Ht '6\' 2"'$  (1.88 m)   Wt 70 kg   SpO2 100%   BMI 19.81 kg/m  Physical Exam Vitals and nursing note reviewed.  Constitutional:      General:  He is not in acute distress.    Appearance: Normal appearance. He is well-developed. He is not diaphoretic.  HENT:     Head: Normocephalic and atraumatic.     Nose: Nose normal.  Eyes:     Conjunctiva/sclera: Conjunctivae normal.     Pupils: Pupils are equal, round, and reactive to light.  Cardiovascular:     Rate and Rhythm: Normal rate and regular rhythm.     Pulses: Normal pulses.     Heart sounds: Normal heart sounds.  Pulmonary:     Effort: Pulmonary effort is normal.     Breath sounds: Normal breath sounds. No wheezing or rales.  Abdominal:     General: Abdomen is flat. Bowel sounds are normal.     Palpations: Abdomen is soft.      Tenderness: There is no abdominal tenderness. There is no guarding or rebound.  Musculoskeletal:        General: Normal range of motion.     Cervical back: Normal range of motion and neck supple.  Skin:    General: Skin is warm and dry.     Capillary Refill: Capillary refill takes less than 2 seconds.  Neurological:     General: No focal deficit present.     Mental Status: He is alert and oriented to person, place, and time.     ED Results / Procedures / Treatments   Labs (all labs ordered are listed, but only abnormal results are displayed) Labs Reviewed  SARS CORONAVIRUS 2 BY RT PCR - Abnormal; Notable for the following components:      Result Value   SARS Coronavirus 2 by RT PCR POSITIVE (*)    All other components within normal limits    EKG None  Radiology No results found.  Procedures Procedures    Medications Ordered in ED Medications  ondansetron (ZOFRAN-ODT) disintegrating tablet 8 mg (8 mg Oral Given 06/20/22 9983)    ED Course/ Medical Decision Making/ A&P                           Medical Decision Making Patient here by EMS thinking he has covid with a cough and nausea   Amount and/or Complexity of Data Reviewed Independent Historian: EMS    Details: see above External Data Reviewed: notes.    Details: previous notes reviewed Labs: ordered.    Details: covid is positive Radiology: ordered and independent interpretation performed.    Details: NO PNA by me on CXR  Risk Prescription drug management. Risk Details: Well appearing, normal HR and O2 saturation.  No vomitus in the ED.  Scant spit in emesis bag.  Based on home medication cannot get paxlovid.  Will start molnupavir.  Tylenol for fever.  Strict return precautions.  [name] was evaluated in Emergency Department on 06/20/2022 for the symptoms described in the history of present illness. He was evaluated in the context of the global COVID-19 pandemic, which necessitated consideration that the  patient might be at risk for infection with the SARS-CoV-2 virus that causes COVID-19. Institutional protocols and algorithms that pertain to the evaluation of patients at risk for COVID-19 are in a state of rapid change based on information released by regulatory bodies including the CDC and federal and state organizations. These policies and algorithms were followed during the patient's care in the ED.     Final Clinical Impression(s) / ED Diagnoses Final diagnoses:  [id number]  Return for intractable cough,  coughing up blood, fevers > 100.4 unrelieved by medication, shortness of breath, intractable vomiting, chest pain, shortness of breath, weakness, numbness, changes in speech, facial asymmetry, abdominal pain, passing out, Inability to tolerate liquids or food, cough, altered mental status or any concerns. No signs of systemic illness or infection. The patient is nontoxic-appearing on exam and vital signs are within normal limits.  I have reviewed the triage vital signs and the nursing notes. Pertinent labs & imaging results that were available during my care of the patient were reviewed by me and considered in my medical decision making (see chart for details). After history, exam, and medical workup I feel the patient has been appropriately medically screened and is safe for discharge home. Pertinent diagnoses were discussed with the patient. Patient was given return precautions.   Rx / DC Orders ED Discharge Orders          Ordered    molnupiravir EUA (LAGEVRIO) 200 mg CAPS capsule  2 times daily        06/20/22 0649              Charidy Cappelletti, MD 06/20/22 4010

## 2022-06-20 NOTE — ED Notes (Signed)
[place] [phone]

## 2022-06-20 NOTE — ED Triage Notes (Signed)
No vomiting noted. Pt coughing into emesis bag and drooling. States he thinks he has covid because he has a runny nose.

## 2022-06-20 NOTE — ED Triage Notes (Signed)
BIB [facility] from [facility]. C/o nausea today. No vomiting. EMS states "he thinks he has covid". Dry cough.

## 2022-06-22 ENCOUNTER — Ambulatory Visit: Payer: Medicare Other | Admitting: Internal Medicine

## 2022-06-22 ENCOUNTER — Emergency Department (HOSPITAL_BASED_OUTPATIENT_CLINIC_OR_DEPARTMENT_OTHER)
Admission: EM | Admit: 2022-06-22 | Discharge: 2022-06-22 | Disposition: A | Discharge status: Skilled Nursing Facility | Payer: Medicare Other | Attending: Emergency Medicine | Admitting: Emergency Medicine

## 2022-06-22 ENCOUNTER — Encounter (HOSPITAL_BASED_OUTPATIENT_CLINIC_OR_DEPARTMENT_OTHER): Payer: Self-pay | Admitting: *Deleted

## 2022-06-22 ENCOUNTER — Other Ambulatory Visit (HOSPITAL_BASED_OUTPATIENT_CLINIC_OR_DEPARTMENT_OTHER): Payer: Self-pay

## 2022-06-22 ENCOUNTER — Telehealth: Payer: Self-pay | Admitting: Internal Medicine

## 2022-06-22 DIAGNOSIS — R1111 Vomiting without nausea: Secondary | ICD-10-CM | POA: Diagnosis not present

## 2022-06-22 DIAGNOSIS — Z743 Need for continuous supervision: Secondary | ICD-10-CM | POA: Diagnosis not present

## 2022-06-22 DIAGNOSIS — U071 COVID-19: Secondary | ICD-10-CM | POA: Insufficient documentation

## 2022-06-22 DIAGNOSIS — Z7982 Long term (current) use of aspirin: Secondary | ICD-10-CM | POA: Diagnosis not present

## 2022-06-22 DIAGNOSIS — Z7401 Bed confinement status: Secondary | ICD-10-CM | POA: Diagnosis not present

## 2022-06-22 DIAGNOSIS — Z7901 Long term (current) use of anticoagulants: Secondary | ICD-10-CM | POA: Insufficient documentation

## 2022-06-22 DIAGNOSIS — R110 Nausea: Secondary | ICD-10-CM | POA: Diagnosis not present

## 2022-06-22 DIAGNOSIS — M2550 Pain in unspecified joint: Secondary | ICD-10-CM | POA: Diagnosis not present

## 2022-06-22 DIAGNOSIS — R5383 Other fatigue: Secondary | ICD-10-CM | POA: Diagnosis present

## 2022-06-22 DIAGNOSIS — R9431 Abnormal electrocardiogram [ECG] [EKG]: Secondary | ICD-10-CM | POA: Diagnosis not present

## 2022-06-22 DIAGNOSIS — R059 Cough, unspecified: Secondary | ICD-10-CM | POA: Diagnosis not present

## 2022-06-22 LAB — CBC WITH DIFFERENTIAL/PLATELET
Abs Immature Granulocytes: 0.03 10*3/uL (ref 0.00–0.07)
Basophils Absolute: 0.1 10*3/uL (ref 0.0–0.1)
Basophils Relative: 1 %
Eosinophils Absolute: 0 10*3/uL (ref 0.0–0.5)
Eosinophils Relative: 0 %
HCT: 42.5 % (ref 39.0–52.0)
Hemoglobin: 14.3 g/dL (ref 13.0–17.0)
Immature Granulocytes: 0 %
Lymphocytes Relative: 14 %
Lymphs Abs: 1.2 10*3/uL (ref 0.7–4.0)
MCH: 30.7 pg (ref 26.0–34.0)
MCHC: 33.6 g/dL (ref 30.0–36.0)
MCV: 91.2 fL (ref 80.0–100.0)
Monocytes Absolute: 1.1 10*3/uL — ABNORMAL HIGH (ref 0.1–1.0)
Monocytes Relative: 12 %
Neutro Abs: 6.5 10*3/uL (ref 1.7–7.7)
Neutrophils Relative %: 73 %
Platelets: 184 10*3/uL (ref 150–400)
RBC: 4.66 MIL/uL (ref 4.22–5.81)
RDW: 15.9 % — ABNORMAL HIGH (ref 11.5–15.5)
WBC: 8.8 10*3/uL (ref 4.0–10.5)
nRBC: 0 % (ref 0.0–0.2)

## 2022-06-22 LAB — COMPREHENSIVE METABOLIC PANEL
ALT: 19 U/L (ref 0–44)
AST: 36 U/L (ref 15–41)
Albumin: 3.7 g/dL (ref 3.5–5.0)
Alkaline Phosphatase: 37 U/L — ABNORMAL LOW (ref 38–126)
Anion gap: 10 (ref 5–15)
BUN: 22 mg/dL (ref 8–23)
CO2: 23 mmol/L (ref 22–32)
Calcium: 8.4 mg/dL — ABNORMAL LOW (ref 8.9–10.3)
Chloride: 99 mmol/L (ref 98–111)
Creatinine, Ser: 0.69 mg/dL (ref 0.61–1.24)
GFR, Estimated: >60 mL/min (ref >60)
Glucose, Bld: 104 mg/dL — ABNORMAL HIGH (ref 70–99)
Potassium: 4 mmol/L (ref 3.5–5.1)
Sodium: 132 mmol/L — ABNORMAL LOW (ref 135–145)
Total Bilirubin: 1 mg/dL (ref 0.3–1.2)
Total Protein: 7.1 g/dL (ref 6.5–8.1)

## 2022-06-22 MED ORDER — ONDANSETRON HCL 4 MG/2ML IJ SOLN
4.0000 mg | Freq: Once | INTRAMUSCULAR | Status: AC
  Administered 2022-06-22: 4 mg via INTRAVENOUS
  Filled 2022-06-22: qty 2

## 2022-06-22 MED ORDER — ONDANSETRON 4 MG PO TBDP
4.0000 mg | ORAL_TABLET | ORAL | 0 refills | Status: DC | PRN
  Filled 2022-06-22: qty 20, 4d supply, fill #0

## 2022-06-22 MED ORDER — SODIUM CHLORIDE 0.9 % IV BOLUS
1000.0000 mL | Freq: Once | INTRAVENOUS | Status: AC
  Administered 2022-06-22: 1000 mL via INTRAVENOUS

## 2022-06-22 MED ORDER — ONDANSETRON 4 MG PO TBDP: 4.0000 mg | ORAL_TABLET | ORAL | 0 refills | Status: DC | PRN

## 2022-06-22 NOTE — Telephone Encounter (Signed)
I spoke with the patient. He already lives at  [facility] and I suggested he asked that RN there to transfer him - temporarily - to the skilled nursing facility, they still require a medical evaluation. He states he will have a hard time coming to the office, in addition I have no appointments. He plans to go to the ER.

## 2022-06-22 NOTE — ED Triage Notes (Signed)
States he was rec Dx with Covid and has not felt well. Feeling very weak and no appetite

## 2022-06-22 NOTE — Discharge Instructions (Signed)
Take tylenol 2 pills 4 times a day.  Drink plenty of fluids.  Return for worsening shortness of breath, headache, confusion. Follow up with your family doctor.     

## 2022-06-22 NOTE — ED Provider Notes (Signed)
[facility] EMERGENCY DEPARTMENT Provider Note   CSN: [id number] Arrival date & time: 06/22/22  1450     History  Chief Complaint  Patient presents with   Weakness    [name] is a 88 y.o. male.  88 yo M with chief complaints COVID.  States been having trouble eating and drinking well.  Going on for a couple days.  He has been having trouble taking himself at home.  No significant difficulty breathing.   Weakness      Home Medications Prior to Admission medications   Medication Sig Start Date End Date Taking? Authorizing Provider  ondansetron (ZOFRAN-ODT) 4 MG disintegrating tablet '4mg'$  ODT q4 hours prn nausea/vomit 06/22/22  Yes Deno Etienne, DO  acetaminophen (TYLENOL) 650 MG CR tablet Take 325-650 mg by mouth every 8 (eight) hours as needed for pain.    [provider]  aspirin EC 81 MG tablet Take 81 mg by mouth daily.    [provider]  calcium carbonate (TUMS - DOSED IN MG ELEMENTAL CALCIUM) 500 MG chewable tablet Chew 1 tablet by mouth daily.    [provider]  Cholecalciferol (VITAMIN D-3) 25 MCG (1000 UT) CAPS Take 1,000 Units by mouth daily with breakfast.    [provider]  denosumab (PROLIA) 60 MG/ML SOSY injection Inject 60 mg into the skin every 6 (six) months.    [provider]  ELIQUIS 5 MG TABS tablet TAKE ONE (1) TABLET BY MOUTH TWO (2) TIMES DAILY 03/07/22   Josue Hector, MD  famotidine-calcium carbonate-magnesium hydroxide (PEPCID COMPLETE) 10-800-165 MG chewable tablet Chew 1 tablet by mouth daily as needed.    [provider]  folic acid (FOLVITE) 1 MG tablet TAKE ONE (1) TABLET BY MOUTH EACH DAY 05/02/22   Josue Hector, MD  loratadine (CLARITIN) 10 MG tablet Take 10 mg by mouth daily as needed for allergies.    [provider]  molnupiravir EUA (LAGEVRIO) 200 mg CAPS capsule Take 4 capsules (800 mg total) by mouth 2 (two) times daily for 5 days. 06/20/22 06/25/22  Palumbo, April,  MD  Multiple Vitamin (MULITIVITAMIN WITH MINERALS) TABS Take 1 tablet by mouth daily.    [provider]  nitroGLYCERIN (NITROSTAT) 0.4 MG SL tablet DISSOLVE 1 TABLET UNDER TONGUE EVERY 5 MINUTES FOR UP TO 3 DOSES AS NEEDED FOR CHEST PAIN 01/05/22   Josue Hector, MD  rosuvastatin (CRESTOR) 10 MG tablet TAKE 1 TABLET BY MOUTH DAILY 01/05/22   Josue Hector, MD      Allergies    Amoxicillin, Zoloft [sertraline hcl], and Remeron [mirtazapine]    Review of Systems   Review of Systems  Neurological:  Positive for weakness.    Physical Exam Updated Vital Signs BP (!) 160/67   Pulse 74   Temp 98.2 F (36.8 C) (Oral)   Resp 19   Ht '6\' 2"'$  (1.88 m)   Wt 63.7 kg   SpO2 97%   BMI 18.03 kg/m  Physical Exam Vitals and nursing note reviewed.  Constitutional:      Appearance: He is well-developed.  HENT:     Head: Normocephalic and atraumatic.  Eyes:     Pupils: Pupils are equal, round, and reactive to light.  Neck:     Vascular: No JVD.  Cardiovascular:     Rate and Rhythm: Normal rate and regular rhythm.     Heart sounds: No murmur heard.    No friction rub. No gallop.  Pulmonary:     Effort: No respiratory distress.     Breath sounds: No wheezing.  Abdominal:     General: There is no distension.     Tenderness: There is no abdominal tenderness. There is no guarding or rebound.  Musculoskeletal:        General: Normal range of motion.     Cervical back: Normal range of motion and neck supple.  Skin:    Coloration: Skin is not pale.     Findings: No rash.  Neurological:     Mental Status: He is alert and oriented to person, place, and time.  Psychiatric:        Behavior: Behavior normal.     ED Results / Procedures / Treatments   Labs (all labs ordered are listed, but only abnormal results are displayed) Labs Reviewed  COMPREHENSIVE METABOLIC PANEL - Abnormal; Notable for the following components:      Result Value   Sodium 132 (*)    Glucose, Bld 104 (*)     Calcium 8.4 (*)    Alkaline Phosphatase 37 (*)    All other components within normal limits  CBC WITH DIFFERENTIAL/PLATELET - Abnormal; Notable for the following components:   RDW 15.9 (*)    Monocytes Absolute 1.1 (*)    All other components within normal limits    EKG None  Radiology No results found.  Procedures Procedures    Medications Ordered in ED Medications  sodium chloride 0.9 % bolus 1,000 mL (0 mLs Intravenous Stopped 06/22/22 1618)  ondansetron (ZOFRAN) injection 4 mg (4 mg Intravenous Given 06/22/22 1518)    ED Course/ Medical Decision Making/ A&P                           Medical Decision Making Amount and/or Complexity of Data Reviewed Labs: ordered.  Risk Prescription drug management.   88 yo M with chief complaints of not feeling well.  He was diagnosed with COVID a few days ago.  Has eaten and drinking less than normal.  He is not hypoxic not tachycardic.  He is mildly dehydrated clinically.  We will give a bolus of IV fluids oral trial ambulate and reassess.  Lab work without significant anemia no significant electrolyte abnormality.  Patient able to eat and drink here and able to ambulate without issue.  Will discharge home.  PCP follow-up.  4:25 PM:  I have discussed the diagnosis/risks/treatment options with the patient.  Evaluation and diagnostic testing in the emergency department does not suggest an emergent condition requiring admission or immediate intervention beyond what has been performed at this time.  They will follow up with  PCP. We also discussed returning to the ED immediately if new or worsening sx occur. We discussed the sx which are most concerning (e.g., sudden worsening pain, fever, inability to tolerate by mouth) that necessitate immediate return. Medications administered to the patient during their visit and any new prescriptions provided to the patient are listed below.  Medications given during this visit Medications  sodium  chloride 0.9 % bolus 1,000 mL (0 mLs Intravenous Stopped 06/22/22 1618)  ondansetron (ZOFRAN) injection 4 mg (4 mg Intravenous Given 06/22/22 1518)     The patient appears reasonably screen and/or stabilized for discharge and I doubt any other medical condition or other [facility] requiring further screening, evaluation, or treatment in the ED at this time prior to discharge.  Final Clinical Impression(s) / ED Diagnoses Final diagnoses:  COVID-19 virus infection    Rx / DC Orders ED Discharge Orders          Ordered    Face-to-face encounter (required for Medicare/Medicaid patients)       Comments: I Cecilio Asper certify that this patient is under my care and that I, or a nurse practitioner or physician's assistant working with me, had a face-to-face encounter that meets the physician face-to-face encounter requirements with this patient on 06/22/2022. The encounter with the patient was in whole, or in part for the following medical condition(s) which is the primary reason for home health care (List medical condition): Patient normally lives alone but is contracted COVID-19 and is increasingly fatigued with this.  Has some difficulty leaving his house to seek medical care.   06/22/22 1624    ondansetron (ZOFRAN-ODT) 4 MG disintegrating tablet        06/22/22 1625              Deno Etienne, DO 06/22/22 1625

## 2022-06-22 NOTE — ED Notes (Signed)
Pt ambulatory without difficulty and without incident. Denies complaints while ambulating. Pt able to eat saltine crackers and drink water without vomiting.

## 2022-06-22 NOTE — Telephone Encounter (Signed)
Patient states he was just seen in the ER for being Covid positive, and that he needs orders to be sent to [facility]. [phone]) in order for him to be admitted to the skill care facility. Asked patient for more details regarding the facility and what he needs specifically but he was unable to give them to me. Please advise.

## 2022-06-22 NOTE — Telephone Encounter (Signed)
FYI- Spoke w/ Pt- he is feeling very poorly since being dx w/ Covid 2 days ago, he feels he needs to be admitted to skilled nursing at [facility]. I informed him that I think he should be seen again at the ER for further evaluation- it can take [place] (outpatient) several days to get skilled nursing paperwork together. Pt agreed to be seen here at the ED at [facility]

## 2022-06-22 NOTE — ED Notes (Signed)
Pt verbalizes understanding of discharge instructions. Opportunity for questions and answers were provided. Pt discharged from the ED.   ?

## 2022-06-23 ENCOUNTER — Ambulatory Visit: Payer: Medicare Other

## 2022-06-23 DIAGNOSIS — U071 COVID-19: Secondary | ICD-10-CM | POA: Diagnosis not present

## 2022-06-23 DIAGNOSIS — I480 Paroxysmal atrial fibrillation: Secondary | ICD-10-CM | POA: Diagnosis not present

## 2022-06-23 DIAGNOSIS — R262 Difficulty in walking, not elsewhere classified: Secondary | ICD-10-CM | POA: Diagnosis not present

## 2022-06-23 DIAGNOSIS — E782 Mixed hyperlipidemia: Secondary | ICD-10-CM | POA: Diagnosis not present

## 2022-06-23 DIAGNOSIS — R278 Other lack of coordination: Secondary | ICD-10-CM | POA: Diagnosis not present

## 2022-06-23 DIAGNOSIS — K219 Gastro-esophageal reflux disease without esophagitis: Secondary | ICD-10-CM | POA: Diagnosis not present

## 2022-06-23 DIAGNOSIS — Z7389 Other problems related to life management difficulty: Secondary | ICD-10-CM | POA: Diagnosis not present

## 2022-06-23 DIAGNOSIS — J449 Chronic obstructive pulmonary disease, unspecified: Secondary | ICD-10-CM | POA: Diagnosis not present

## 2022-06-23 DIAGNOSIS — M810 Age-related osteoporosis without current pathological fracture: Secondary | ICD-10-CM | POA: Diagnosis not present

## 2022-06-23 DIAGNOSIS — M6281 Muscle weakness (generalized): Secondary | ICD-10-CM | POA: Diagnosis not present

## 2022-06-23 DIAGNOSIS — R1314 Dysphagia, pharyngoesophageal phase: Secondary | ICD-10-CM | POA: Diagnosis not present

## 2022-06-26 DIAGNOSIS — K219 Gastro-esophageal reflux disease without esophagitis: Secondary | ICD-10-CM | POA: Diagnosis not present

## 2022-06-26 DIAGNOSIS — R262 Difficulty in walking, not elsewhere classified: Secondary | ICD-10-CM | POA: Diagnosis not present

## 2022-06-26 DIAGNOSIS — R278 Other lack of coordination: Secondary | ICD-10-CM | POA: Diagnosis not present

## 2022-06-26 DIAGNOSIS — R1314 Dysphagia, pharyngoesophageal phase: Secondary | ICD-10-CM | POA: Diagnosis not present

## 2022-06-26 DIAGNOSIS — M6281 Muscle weakness (generalized): Secondary | ICD-10-CM | POA: Diagnosis not present

## 2022-06-26 DIAGNOSIS — Z7389 Other problems related to life management difficulty: Secondary | ICD-10-CM | POA: Diagnosis not present

## 2022-06-26 DIAGNOSIS — U071 COVID-19: Secondary | ICD-10-CM | POA: Diagnosis not present

## 2022-06-27 DIAGNOSIS — Z7389 Other problems related to life management difficulty: Secondary | ICD-10-CM | POA: Diagnosis not present

## 2022-06-27 DIAGNOSIS — F32A Depression, unspecified: Secondary | ICD-10-CM | POA: Diagnosis not present

## 2022-06-27 DIAGNOSIS — I2510 Atherosclerotic heart disease of native coronary artery without angina pectoris: Secondary | ICD-10-CM | POA: Diagnosis not present

## 2022-06-27 DIAGNOSIS — R262 Difficulty in walking, not elsewhere classified: Secondary | ICD-10-CM | POA: Diagnosis not present

## 2022-06-27 DIAGNOSIS — M6281 Muscle weakness (generalized): Secondary | ICD-10-CM | POA: Diagnosis not present

## 2022-06-27 DIAGNOSIS — K219 Gastro-esophageal reflux disease without esophagitis: Secondary | ICD-10-CM | POA: Diagnosis not present

## 2022-06-27 DIAGNOSIS — J449 Chronic obstructive pulmonary disease, unspecified: Secondary | ICD-10-CM | POA: Diagnosis not present

## 2022-06-27 DIAGNOSIS — U071 COVID-19: Secondary | ICD-10-CM | POA: Diagnosis not present

## 2022-06-27 DIAGNOSIS — R278 Other lack of coordination: Secondary | ICD-10-CM | POA: Diagnosis not present

## 2022-06-27 DIAGNOSIS — I480 Paroxysmal atrial fibrillation: Secondary | ICD-10-CM | POA: Diagnosis not present

## 2022-06-27 DIAGNOSIS — K589 Irritable bowel syndrome without diarrhea: Secondary | ICD-10-CM | POA: Diagnosis not present

## 2022-06-27 DIAGNOSIS — R1314 Dysphagia, pharyngoesophageal phase: Secondary | ICD-10-CM | POA: Diagnosis not present

## 2022-06-28 DIAGNOSIS — K219 Gastro-esophageal reflux disease without esophagitis: Secondary | ICD-10-CM | POA: Diagnosis not present

## 2022-06-28 DIAGNOSIS — R262 Difficulty in walking, not elsewhere classified: Secondary | ICD-10-CM | POA: Diagnosis not present

## 2022-06-28 DIAGNOSIS — R278 Other lack of coordination: Secondary | ICD-10-CM | POA: Diagnosis not present

## 2022-06-28 DIAGNOSIS — U071 COVID-19: Secondary | ICD-10-CM | POA: Diagnosis not present

## 2022-06-28 DIAGNOSIS — Z7389 Other problems related to life management difficulty: Secondary | ICD-10-CM | POA: Diagnosis not present

## 2022-06-28 DIAGNOSIS — M6281 Muscle weakness (generalized): Secondary | ICD-10-CM | POA: Diagnosis not present

## 2022-06-28 DIAGNOSIS — R1314 Dysphagia, pharyngoesophageal phase: Secondary | ICD-10-CM | POA: Diagnosis not present

## 2022-06-29 ENCOUNTER — Other Ambulatory Visit (HOSPITAL_BASED_OUTPATIENT_CLINIC_OR_DEPARTMENT_OTHER): Payer: Self-pay

## 2022-06-29 DIAGNOSIS — U071 COVID-19: Secondary | ICD-10-CM | POA: Diagnosis not present

## 2022-06-29 DIAGNOSIS — R1314 Dysphagia, pharyngoesophageal phase: Secondary | ICD-10-CM | POA: Diagnosis not present

## 2022-06-29 DIAGNOSIS — Z7389 Other problems related to life management difficulty: Secondary | ICD-10-CM | POA: Diagnosis not present

## 2022-06-29 DIAGNOSIS — M6281 Muscle weakness (generalized): Secondary | ICD-10-CM | POA: Diagnosis not present

## 2022-06-29 DIAGNOSIS — K219 Gastro-esophageal reflux disease without esophagitis: Secondary | ICD-10-CM | POA: Diagnosis not present

## 2022-06-29 DIAGNOSIS — R278 Other lack of coordination: Secondary | ICD-10-CM | POA: Diagnosis not present

## 2022-06-29 DIAGNOSIS — R262 Difficulty in walking, not elsewhere classified: Secondary | ICD-10-CM | POA: Diagnosis not present

## 2022-06-30 DIAGNOSIS — K219 Gastro-esophageal reflux disease without esophagitis: Secondary | ICD-10-CM | POA: Diagnosis not present

## 2022-06-30 DIAGNOSIS — R262 Difficulty in walking, not elsewhere classified: Secondary | ICD-10-CM | POA: Diagnosis not present

## 2022-06-30 DIAGNOSIS — U071 COVID-19: Secondary | ICD-10-CM | POA: Diagnosis not present

## 2022-06-30 DIAGNOSIS — R1314 Dysphagia, pharyngoesophageal phase: Secondary | ICD-10-CM | POA: Diagnosis not present

## 2022-06-30 DIAGNOSIS — Z7389 Other problems related to life management difficulty: Secondary | ICD-10-CM | POA: Diagnosis not present

## 2022-06-30 DIAGNOSIS — R278 Other lack of coordination: Secondary | ICD-10-CM | POA: Diagnosis not present

## 2022-06-30 DIAGNOSIS — M6281 Muscle weakness (generalized): Secondary | ICD-10-CM | POA: Diagnosis not present

## 2022-07-01 DIAGNOSIS — Z7389 Other problems related to life management difficulty: Secondary | ICD-10-CM | POA: Diagnosis not present

## 2022-07-01 DIAGNOSIS — U071 COVID-19: Secondary | ICD-10-CM | POA: Diagnosis not present

## 2022-07-01 DIAGNOSIS — M6281 Muscle weakness (generalized): Secondary | ICD-10-CM | POA: Diagnosis not present

## 2022-07-01 DIAGNOSIS — R1314 Dysphagia, pharyngoesophageal phase: Secondary | ICD-10-CM | POA: Diagnosis not present

## 2022-07-01 DIAGNOSIS — K219 Gastro-esophageal reflux disease without esophagitis: Secondary | ICD-10-CM | POA: Diagnosis not present

## 2022-07-01 DIAGNOSIS — R262 Difficulty in walking, not elsewhere classified: Secondary | ICD-10-CM | POA: Diagnosis not present

## 2022-07-01 DIAGNOSIS — R278 Other lack of coordination: Secondary | ICD-10-CM | POA: Diagnosis not present

## 2022-07-03 DIAGNOSIS — M6281 Muscle weakness (generalized): Secondary | ICD-10-CM | POA: Diagnosis not present

## 2022-07-03 DIAGNOSIS — U071 COVID-19: Secondary | ICD-10-CM | POA: Diagnosis not present

## 2022-07-03 DIAGNOSIS — R1314 Dysphagia, pharyngoesophageal phase: Secondary | ICD-10-CM | POA: Diagnosis not present

## 2022-07-03 DIAGNOSIS — Z7389 Other problems related to life management difficulty: Secondary | ICD-10-CM | POA: Diagnosis not present

## 2022-07-03 DIAGNOSIS — K219 Gastro-esophageal reflux disease without esophagitis: Secondary | ICD-10-CM | POA: Diagnosis not present

## 2022-07-03 DIAGNOSIS — R262 Difficulty in walking, not elsewhere classified: Secondary | ICD-10-CM | POA: Diagnosis not present

## 2022-07-03 DIAGNOSIS — R278 Other lack of coordination: Secondary | ICD-10-CM | POA: Diagnosis not present

## 2022-07-04 DIAGNOSIS — M6281 Muscle weakness (generalized): Secondary | ICD-10-CM | POA: Diagnosis not present

## 2022-07-04 DIAGNOSIS — R262 Difficulty in walking, not elsewhere classified: Secondary | ICD-10-CM | POA: Diagnosis not present

## 2022-07-04 DIAGNOSIS — R278 Other lack of coordination: Secondary | ICD-10-CM | POA: Diagnosis not present

## 2022-07-04 DIAGNOSIS — K219 Gastro-esophageal reflux disease without esophagitis: Secondary | ICD-10-CM | POA: Diagnosis not present

## 2022-07-04 DIAGNOSIS — U071 COVID-19: Secondary | ICD-10-CM | POA: Diagnosis not present

## 2022-07-04 DIAGNOSIS — Z7389 Other problems related to life management difficulty: Secondary | ICD-10-CM | POA: Diagnosis not present

## 2022-07-04 DIAGNOSIS — R1314 Dysphagia, pharyngoesophageal phase: Secondary | ICD-10-CM | POA: Diagnosis not present

## 2022-07-05 DIAGNOSIS — Z7389 Other problems related to life management difficulty: Secondary | ICD-10-CM | POA: Diagnosis not present

## 2022-07-05 DIAGNOSIS — M6281 Muscle weakness (generalized): Secondary | ICD-10-CM | POA: Diagnosis not present

## 2022-07-05 DIAGNOSIS — R262 Difficulty in walking, not elsewhere classified: Secondary | ICD-10-CM | POA: Diagnosis not present

## 2022-07-05 DIAGNOSIS — R1314 Dysphagia, pharyngoesophageal phase: Secondary | ICD-10-CM | POA: Diagnosis not present

## 2022-07-05 DIAGNOSIS — R278 Other lack of coordination: Secondary | ICD-10-CM | POA: Diagnosis not present

## 2022-07-05 DIAGNOSIS — K219 Gastro-esophageal reflux disease without esophagitis: Secondary | ICD-10-CM | POA: Diagnosis not present

## 2022-07-05 DIAGNOSIS — U071 COVID-19: Secondary | ICD-10-CM | POA: Diagnosis not present

## 2022-07-06 DIAGNOSIS — K219 Gastro-esophageal reflux disease without esophagitis: Secondary | ICD-10-CM | POA: Diagnosis not present

## 2022-07-06 DIAGNOSIS — R262 Difficulty in walking, not elsewhere classified: Secondary | ICD-10-CM | POA: Diagnosis not present

## 2022-07-06 DIAGNOSIS — R278 Other lack of coordination: Secondary | ICD-10-CM | POA: Diagnosis not present

## 2022-07-06 DIAGNOSIS — R1314 Dysphagia, pharyngoesophageal phase: Secondary | ICD-10-CM | POA: Diagnosis not present

## 2022-07-06 DIAGNOSIS — Z7389 Other problems related to life management difficulty: Secondary | ICD-10-CM | POA: Diagnosis not present

## 2022-07-06 DIAGNOSIS — U071 COVID-19: Secondary | ICD-10-CM | POA: Diagnosis not present

## 2022-07-06 DIAGNOSIS — M6281 Muscle weakness (generalized): Secondary | ICD-10-CM | POA: Diagnosis not present

## 2022-07-07 DIAGNOSIS — K219 Gastro-esophageal reflux disease without esophagitis: Secondary | ICD-10-CM | POA: Diagnosis not present

## 2022-07-07 DIAGNOSIS — R278 Other lack of coordination: Secondary | ICD-10-CM | POA: Diagnosis not present

## 2022-07-07 DIAGNOSIS — Z7389 Other problems related to life management difficulty: Secondary | ICD-10-CM | POA: Diagnosis not present

## 2022-07-07 DIAGNOSIS — U071 COVID-19: Secondary | ICD-10-CM | POA: Diagnosis not present

## 2022-07-07 DIAGNOSIS — R1314 Dysphagia, pharyngoesophageal phase: Secondary | ICD-10-CM | POA: Diagnosis not present

## 2022-07-07 DIAGNOSIS — R262 Difficulty in walking, not elsewhere classified: Secondary | ICD-10-CM | POA: Diagnosis not present

## 2022-07-07 DIAGNOSIS — M6281 Muscle weakness (generalized): Secondary | ICD-10-CM | POA: Diagnosis not present

## 2022-07-09 IMAGING — CR DG CHEST 2V
2 series · 2 of 2 positions shown · non-contrast
Comparison: [DATE] [DATE], [DATE], [DATE] [DATE], [DATE], [DATE] [DATE], [DATE]

CLINICAL DATA: 10 pound weight loss since [REDACTED].

EXAM:
CHEST - 2 VIEW

[w chest pa]
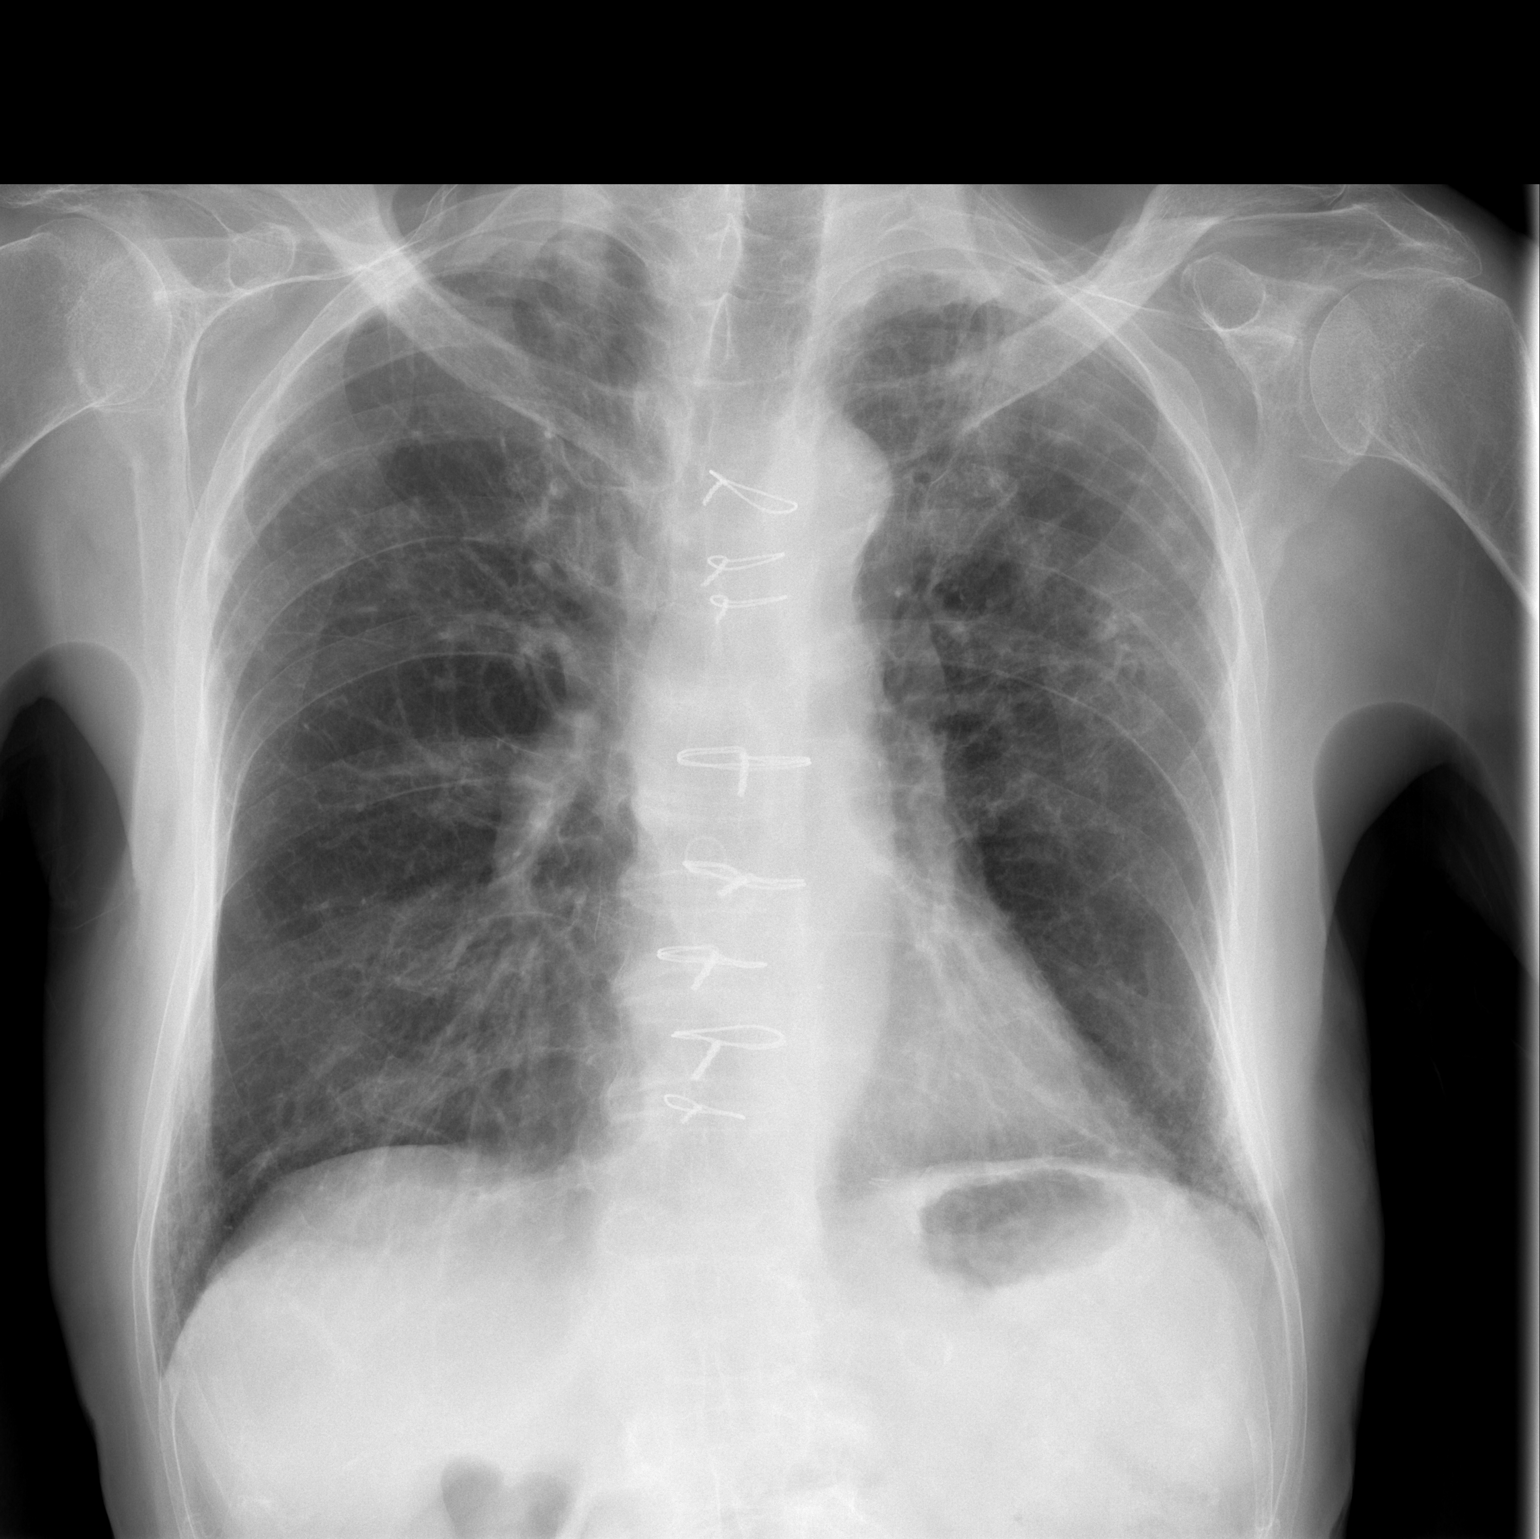

[w chest lat]
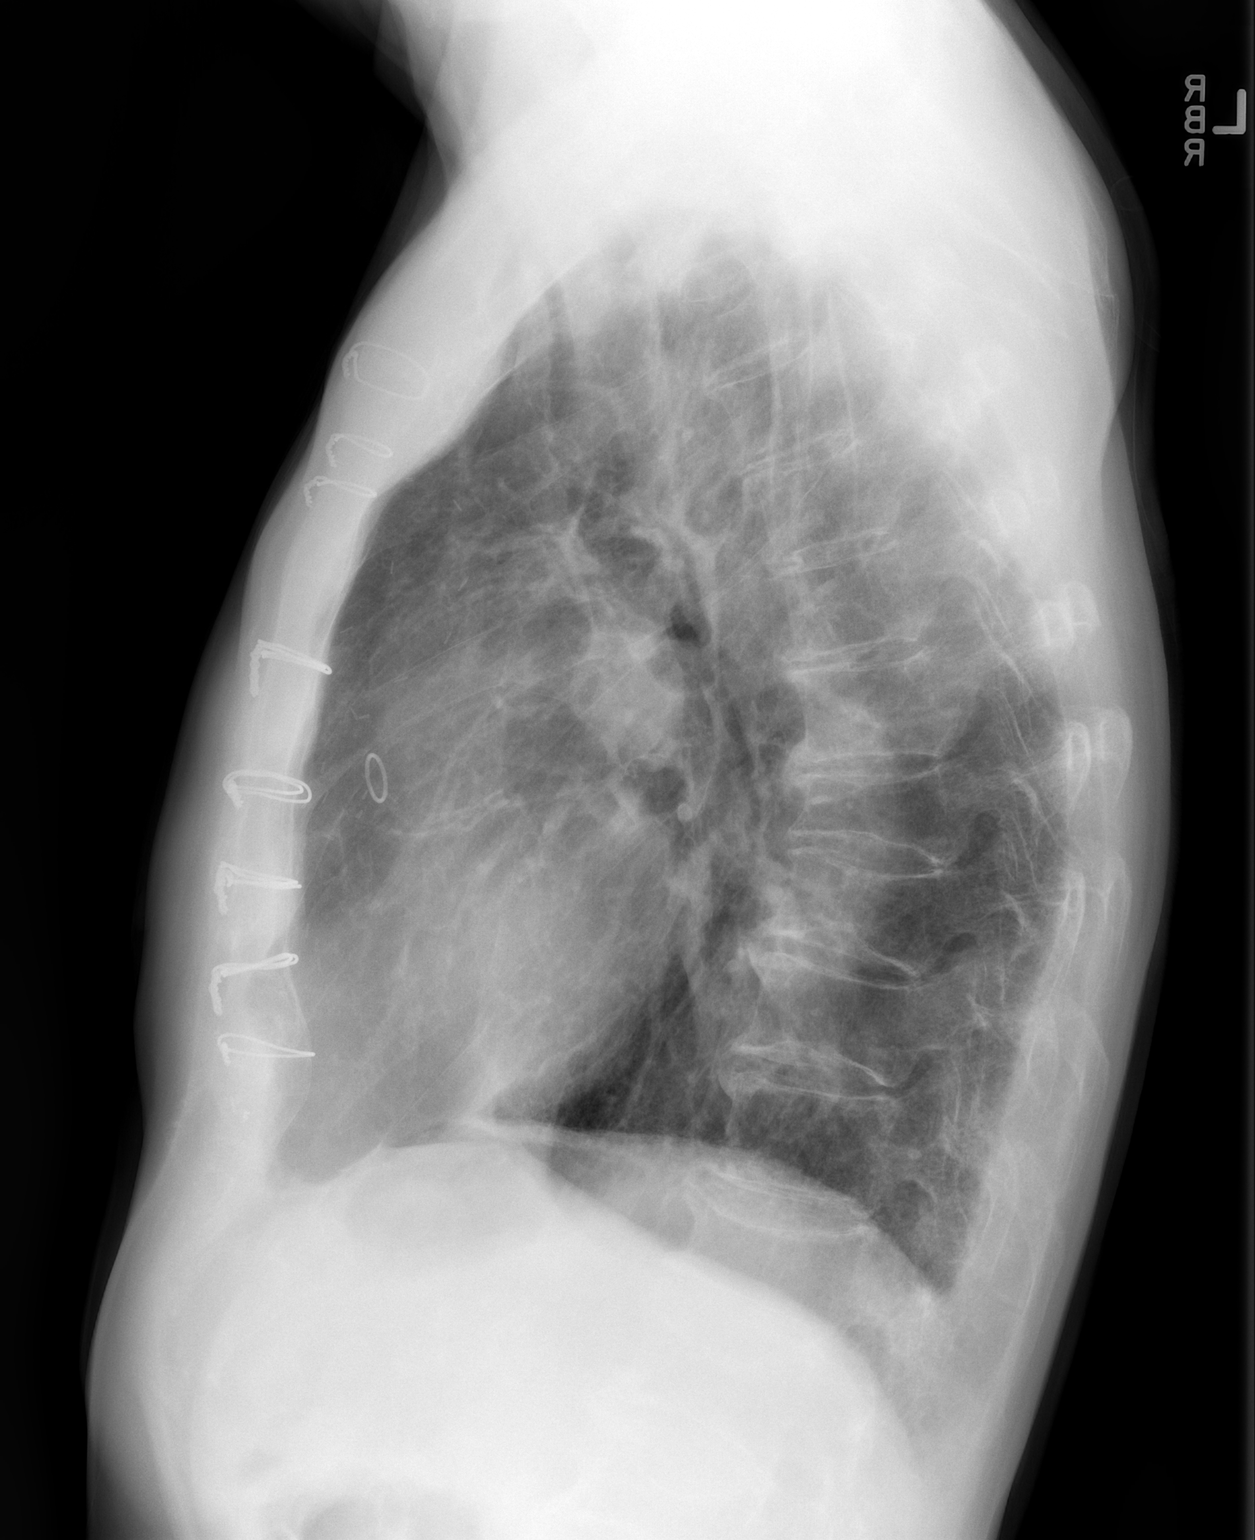

[2 of 2 positions shown; findings below may reference images not displayed]

FINDINGS: The heart size and mediastinal contours are stable. Chronic scars of
bilateral upper lobes are identified. There is hazy opacity in the
left apex slightly more prominent compared to prior exams. There is
no focal infiltrate, pulmonary edema, or pleural effusion. The
visualized skeletal structures are stable.
IMPRESSION: Chronic scars of bilateral upper lobes are identified. There is hazy
opacity in the left apex slightly more prominent compared to prior
exams. Consider further evaluation with CT chest to exclude
underlying mass.

## 2022-07-10 DIAGNOSIS — Z7389 Other problems related to life management difficulty: Secondary | ICD-10-CM | POA: Diagnosis not present

## 2022-07-10 DIAGNOSIS — R1314 Dysphagia, pharyngoesophageal phase: Secondary | ICD-10-CM | POA: Diagnosis not present

## 2022-07-10 DIAGNOSIS — U071 COVID-19: Secondary | ICD-10-CM | POA: Diagnosis not present

## 2022-07-10 DIAGNOSIS — M6281 Muscle weakness (generalized): Secondary | ICD-10-CM | POA: Diagnosis not present

## 2022-07-10 DIAGNOSIS — R262 Difficulty in walking, not elsewhere classified: Secondary | ICD-10-CM | POA: Diagnosis not present

## 2022-07-10 DIAGNOSIS — K219 Gastro-esophageal reflux disease without esophagitis: Secondary | ICD-10-CM | POA: Diagnosis not present

## 2022-07-10 DIAGNOSIS — R278 Other lack of coordination: Secondary | ICD-10-CM | POA: Diagnosis not present

## 2022-07-10 NOTE — Progress Notes (Unsigned)
Patient ID: [name], male   DOB: 1930/06/03, 88 y.o.   MRN: [id number]     88 y.o. with history of CABG in 2011.  Has had atypical chest pains in past. Most recent normal myovue done 05/21/20 with no ischemia and EF 57% Had post op PAF with recurrence in 2015 Multaq d/c due to GI side effects. Maintaining NSR and continues on Eliquis He takes statin for HLD   Still living at [facility] last 11years and likes it   Seen by CM 01/02/22 for labile BP no changes made   Seen in ED 06/22/22 for weakness and poor PO intake with COVID hydrated and d/c home CXR some diffuse interstitial dx stable   ***    ROS: Denies fever, malais, weight loss, blurry vision, decreased visual acuity, cough, sputum, SOB, hemoptysis, pleuritic pain, palpitaitons, heartburn, abdominal pain, melena, lower extremity edema, claudication, or rash.  All other systems reviewed and negative  General: There were no vitals taken for this visit. Affect appropriate Healthy:  appears stated age 88: normal Neck supple with no adenopathy JVP normal no bruits no thyromegaly Lungs clear with no wheezing and good diaphragmatic motion Heart:  S1/S2 no murmur, no rub, gallop or click PMI normal post sternotomy  Abdomen: benighn, BS positve, no tenderness, no AAA no bruit.  No HSM or HJR Distal pulses intact with no bruits No edema Neuro non-focal Skin warm and dry No muscular weakness   Current Outpatient Medications  Medication Sig Dispense Refill   acetaminophen (TYLENOL) 650 MG CR tablet Take 325-650 mg by mouth every 8 (eight) hours as needed for pain.     aspirin EC 81 MG tablet Take 81 mg by mouth daily.     calcium carbonate (TUMS - DOSED IN MG ELEMENTAL CALCIUM) 500 MG chewable tablet Chew 1 tablet by mouth daily.     Cholecalciferol (VITAMIN D-3) 25 MCG (1000 UT) CAPS Take 1,000 Units by mouth daily with breakfast.     denosumab (PROLIA) 60 MG/ML SOSY injection Inject 60 mg into the skin every 6 (six) months.      ELIQUIS 5 MG TABS tablet TAKE ONE (1) TABLET BY MOUTH TWO (2) TIMES DAILY 60 tablet 5   famotidine-calcium carbonate-magnesium hydroxide (PEPCID COMPLETE) 10-800-165 MG chewable tablet Chew 1 tablet by mouth daily as needed.     folic acid (FOLVITE) 1 MG tablet TAKE ONE (1) TABLET BY MOUTH EACH DAY 90 tablet 2   loratadine (CLARITIN) 10 MG tablet Take 10 mg by mouth daily as needed for allergies.     Multiple Vitamin (MULITIVITAMIN WITH MINERALS) TABS Take 1 tablet by mouth daily.     nitroGLYCERIN (NITROSTAT) 0.4 MG SL tablet DISSOLVE 1 TABLET UNDER TONGUE EVERY 5 MINUTES FOR UP TO 3 DOSES AS NEEDED FOR CHEST PAIN 25 tablet 9   ondansetron (ZOFRAN-ODT) 4 MG disintegrating tablet Take 1 tablet (4 mg total) by mouth and melt on the tongue every 4 (four) hours as needed for nausea/vomiting. 20 tablet 0   rosuvastatin (CRESTOR) 10 MG tablet TAKE 1 TABLET BY MOUTH DAILY 90 tablet 2   No current facility-administered medications for this visit.    Allergies  Amoxicillin, Zoloft [sertraline hcl], and Remeron [mirtazapine]  Electrocardiogram:  4/15  SB rate 45  ICRBBB  Toprol stopped  09/26/17 SR rate 54 ICRBBB   Assessment and Plan CAD/CABG:  2011   Normal myovue 08/27/18 EF 62% and 05/21/20 with EF 57%   PAF: Maint NSR no palpitaitons  on eliquis Multaq d/c due to nausea No beta blockers due to bradycardia   GERD continue protonix concern for weight loss CT abdomen negative FU GI    Chol at goal continue crestor LDL 20   Osteoporosis:  Continue Vit D and prolia f/u [place]   Urology:  Elevated PSA labs today if elevated refer back to urology   F/U with cardiology in a year    Jenkins Rouge MD [facility]

## 2022-07-11 ENCOUNTER — Encounter: Payer: Self-pay | Admitting: Cardiovascular Disease

## 2022-07-11 ENCOUNTER — Ambulatory Visit: Payer: Medicare Other | Admitting: Cardiovascular Disease

## 2022-07-11 VITALS — BP 96/58 | HR 80 | Ht 74.0 in | Wt 142.0 lb

## 2022-07-11 DIAGNOSIS — E782 Mixed hyperlipidemia: Secondary | ICD-10-CM

## 2022-07-11 DIAGNOSIS — I480 Paroxysmal atrial fibrillation: Secondary | ICD-10-CM

## 2022-07-11 DIAGNOSIS — I2510 Atherosclerotic heart disease of native coronary artery without angina pectoris: Secondary | ICD-10-CM | POA: Diagnosis not present

## 2022-07-11 NOTE — Patient Instructions (Addendum)
Medication Instructions:  Your physician recommends that you continue on your current medications as directed. Please refer to the Current Medication list given to you today.  *If you need a refill on your cardiac medications before your next appointment, please call your pharmacy*  Lab Work: If you have labs (blood work) drawn today and your tests are completely normal, you will receive your results only by: MyChart Message (if you have MyChart) OR A paper copy in the mail If you have any lab test that is abnormal or we need to change your treatment, we will call you to review the results.  Testing/Procedures: None ordered today.  Follow-Up: At [facility], you and your health needs are our priority.  As part of our continuing mission to provide you with exceptional heart care, we have created designated Provider Care Teams.  These Care Teams include your primary Cardiologist (physician) and Advanced Practice Providers (APPs -  Physician Assistants and Nurse Practitioners) who all work together to provide you with the care you need, when you need it.  We recommend signing up for the patient portal called "MyChart".  Sign up information is provided on this After Visit Summary.  MyChart is used to connect with patients for Virtual Visits (Telemedicine).  Patients are able to view lab/test results, encounter notes, upcoming appointments, etc.  Non-urgent messages can be sent to your provider as well.   To learn more about what you can do with MyChart, go to [email or web address].    Your next appointment:   12 month(s)  The format for your next appointment:   In Person  Provider:   Peter Nishan, MD {   Important Information About Sugar       

## 2022-07-18 DIAGNOSIS — U071 COVID-19: Secondary | ICD-10-CM | POA: Diagnosis not present

## 2022-07-18 DIAGNOSIS — R2689 Other abnormalities of gait and mobility: Secondary | ICD-10-CM | POA: Diagnosis not present

## 2022-07-19 ENCOUNTER — Ambulatory Visit: Payer: Self-pay

## 2022-07-19 DIAGNOSIS — U071 COVID-19: Secondary | ICD-10-CM | POA: Diagnosis not present

## 2022-07-19 DIAGNOSIS — R2689 Other abnormalities of gait and mobility: Secondary | ICD-10-CM | POA: Diagnosis not present

## 2022-07-19 NOTE — Patient Instructions (Signed)
Visit Information  Thank you for taking time to visit with me today. Please don't hesitate to contact me if I can be of assistance to you.   Following are the goals we discussed today:   Goals Addressed             This Visit's Progress    Care Coordination-continue to Improve post Covid infection       Care Coordination Interventions: Provided education to patient to enhance basic understanding of COVID-19 as a viral disease, measures to prevent exposure, signs and symptoms, recommended vaccine schedule, when to contact provider Assessed social determinant of health barriers Reviewed upcoming scheduled appointments and encouraged to attend as scheduled Confirmed patient has transportation to appointments Care Gaps reviewed Discussed appetite and activity level Encouraged to contact provider if condition does not continue to improve or worsens        Our next appointment is by telephone on 08/02/22 at 11 am  Please call the care guide team at [phone] if you need to cancel or reschedule your appointment.   If you are experiencing a Mental Health or [place] or need someone to talk to, please call the Suicide and Crisis Lifeline: 988  Patient verbalizes understanding of instructions and care plan provided today and agrees to view in [place]. Active MyChart status and patient understanding of how to access instructions and care plan via MyChart confirmed with patient.     Thea Silversmith, RN, MSN, BSN, CCM Care Coordinator [phone]

## 2022-07-19 NOTE — Patient Outreach (Signed)
  Care Coordination   Initial Visit Note   07/19/2022 Name: [name] MRN: [id number] DOB: [birth year]  [name] is a 88 y.o. year old male who sees Larose Kells, Alda Berthold, MD for primary care. I spoke with  [name] by phone today  What matters to the patients health and wellness today?  Continue to recover from Covid. Patient reports is in the process of recovering from Covid and states it has been a couple weeks. Per chart ED visit for Covid 06/22/22. He states was previously nauseas, but the nausea has resided now. He reports today was the first day he felt like he had an appetite.   Goals Addressed             This Visit's Progress    Care Coordination-continue to Improve post Covid infection       Care Coordination Interventions: Assessed social determinant of health barriers Reviewed upcoming scheduled appointments and encouraged to attend as scheduled Confirmed patient has transportation to appointments Care Gaps reviewed Discussed appetite and activity level Encouraged to contact provider if condition does not continue to improve or worsens       SDOH assessments and interventions completed:  Yes  SDOH Interventions Today    Flowsheet Row Most Recent Value  SDOH Interventions   Food Insecurity Interventions Intervention Not Indicated  Transportation Interventions Intervention Not Indicated        Care Coordination Interventions Activated:  Yes  Care Coordination Interventions:  Yes, provided   Follow up plan: Follow up call scheduled for 08/02/22    Encounter Outcome:  Pt. Visit Completed   Thea Silversmith, RN, MSN, BSN, [place] Coordinator [phone]

## 2022-07-21 ENCOUNTER — Ambulatory Visit: Payer: Medicare Other

## 2022-07-21 DIAGNOSIS — M810 Age-related osteoporosis without current pathological fracture: Secondary | ICD-10-CM

## 2022-07-21 MED ORDER — DENOSUMAB 60 MG/ML ~~LOC~~ SOSY
60.0000 mg | PREFILLED_SYRINGE | Freq: Once | SUBCUTANEOUS | Status: AC
  Administered 2022-07-21: 60 mg via SUBCUTANEOUS

## 2022-07-21 NOTE — Progress Notes (Signed)
Patient verbally confirmed name, date of birth, and correct medication to be administered. Prolia injection administered and pt tolerated well.  

## 2022-07-24 DIAGNOSIS — R2689 Other abnormalities of gait and mobility: Secondary | ICD-10-CM | POA: Diagnosis not present

## 2022-07-24 DIAGNOSIS — U071 COVID-19: Secondary | ICD-10-CM | POA: Diagnosis not present

## 2022-07-25 ENCOUNTER — Ambulatory Visit: Payer: Medicare Other | Admitting: Medical

## 2022-07-25 ENCOUNTER — Ambulatory Visit (INDEPENDENT_AMBULATORY_CARE_PROVIDER_SITE_OTHER): Payer: Medicare Other | Admitting: *Deleted

## 2022-07-25 ENCOUNTER — Encounter: Payer: Self-pay | Admitting: Medical

## 2022-07-25 ENCOUNTER — Ambulatory Visit (HOSPITAL_BASED_OUTPATIENT_CLINIC_OR_DEPARTMENT_OTHER)
Admission: RE | Admit: 2022-07-25 | Discharge: 2022-07-25 | Disposition: A | Discharge status: Home / Self Care | Payer: Medicare Other | Source: Ambulatory Visit | Attending: Medical | Admitting: Medical

## 2022-07-25 VITALS — BP 97/59 | HR 85 | Temp 97.9°F | Ht 74.0 in | Wt 143.8 lb

## 2022-07-25 VITALS — BP 118/60 | HR 85 | Temp 97.9°F | Resp 18 | Ht 74.0 in | Wt 143.0 lb

## 2022-07-25 DIAGNOSIS — R5383 Other fatigue: Secondary | ICD-10-CM | POA: Insufficient documentation

## 2022-07-25 DIAGNOSIS — R059 Cough, unspecified: Secondary | ICD-10-CM

## 2022-07-25 DIAGNOSIS — R142 Eructation: Secondary | ICD-10-CM | POA: Diagnosis not present

## 2022-07-25 DIAGNOSIS — R110 Nausea: Secondary | ICD-10-CM | POA: Diagnosis not present

## 2022-07-25 DIAGNOSIS — D649 Anemia, unspecified: Secondary | ICD-10-CM

## 2022-07-25 DIAGNOSIS — R6883 Chills (without fever): Secondary | ICD-10-CM | POA: Diagnosis not present

## 2022-07-25 DIAGNOSIS — Z0000 Encounter for general adult medical examination without abnormal findings: Secondary | ICD-10-CM

## 2022-07-25 NOTE — Progress Notes (Signed)
Subjective:    Patient ID: [name], male    DOB: 05-Mar-1930, 88 y.o.   MRN: [id number]  HPI Pt in for follow up.  Pt had covid about one month ago. Overall he states he feels better but still has some fatigue and chills.   He states decreased taste/food does taste right. Decreased appetite.  Pt went to [facility] as he was recovering from covid. He was nausea at rehab. He states was not eating much. He states was almost dry heaving saliva at that time.  On review pt did get molnupiravir on July 25. 2023.  Still having some daily nausea. He is also belching some recently. No diarrhea reported.     Review of Systems  Constitutional:  Negative for chills, fatigue and fever.  HENT:  Negative for congestion, drooling and ear pain.   Respiratory:  Negative for chest tightness, shortness of breath and wheezing.   Cardiovascular:  Negative for chest pain and palpitations.  Gastrointestinal:  Negative for abdominal pain and blood in stool.       Mild nausea and some belching.  Genitourinary:  Negative for dysuria and flank pain.  Neurological:  Negative for facial asymmetry and headaches.  Hematological:  Negative for adenopathy. Does not bruise/bleed easily.  Psychiatric/Behavioral:  Negative for behavioral problems, decreased concentration and dysphoric mood.    Past Medical History:  Diagnosis Date   Anxiety    Atrial fibrillation ([place])    Blood transfusion without reported diagnosis    CAD    a. 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 // Myoview 9/22: EF 57, normal perfusion; low risk   Cataract    Chronic abdominal pain    Chronic nausea    Coronary artery disease    Diverticulosis 2011   Diverticulitis 2004   Dyspepsia    Esophageal reflux    GERD (gastroesophageal reflux disease)    Headache    saw neuro 4-16, MRI done   History of echocardiogram    Echo 9/22: EF 60-65, no RWMA,  normal diastolic function, GLS -73.5, normal RVSF, normal PASP, mild-moderate BAE, trivial MR, moderate MAC, moderate AV calcification, mild AI, mild aortic stenosis (mean gradient 5 mmHg, V-max 158 cm/s, DI 0.62), mild dilation of ascending aorta (40 mm)   HYPERPLASIA, PROSTATE NOS W/URINARY OBST/LUTS    IBS    INGUINAL HERNIA    [place]ichen planus 3299   [name]   LUMBAR RADICULOPATHY, RIGHT    Mixed hyperlipidemia    OSTEOPOROSIS    PONV (postoperative nausea and vomiting)    PREMATURE VENTRICULAR CONTRACTIONS    a. After CABG - was on Coumadin/Multaq for a period of time. Coumadin discontinued 04/2010 after maintaining NSR.   UNSPECIFIED ANEMIA      Social History   Socioeconomic History   Marital status: Widowed    Spouse name: Not on file   Number of children: 0   Years of education: Not on file   Highest education level: Not on file  Occupational History   Occupation: retired    Fish farm manager: RETIRED  Tobacco Use   Smoking status: Former    Types: Cigarettes    Quit date: 11/27/1962    Years since quitting: 59.6   Smokeless tobacco: Never  Vaping Use   Vaping Use: Never used  Substance and Sexual Activity   Alcohol use: Not Currently    Alcohol/week: 0.0  standard drinks of alcohol    Comment: quit drinking    Drug use: No   Sexual activity: Not Currently  Other Topics Concern   Not on file  Social History Narrative   Lives at [facility] in independent living.     Lost wife 06-2016.     Has no children.  Family: nephews-nices in [place]   Retired from [employer].     Still drives.    Social Determinants of Health   Financial Resource Strain: Low Risk  (10/31/2021)   Overall Financial Resource Strain (CARDIA)    Difficulty of Paying Living Expenses: Not very hard  Food Insecurity: No Food Insecurity (07/19/2022)   Hunger Vital Sign    Worried About Running Out of Food in the Last Year: Never true    Ran Out of Food in the Last Year: Never true   Transportation Needs: No Transportation Needs (07/19/2022)   PRAPARE - Hydrologist (Medical): No    Lack of Transportation (Non-Medical): No  Physical Activity: Sufficiently Active (05/04/2021)   Exercise Vital Sign    Days of Exercise per Week: 7 days    Minutes of Exercise per Session: 40 min  Stress: No Stress Concern Present (07/20/2021)   [place]    Feeling of Stress : Not at all  Social Connections: Moderately Isolated (07/20/2021)   Social Connection and Isolation Panel [NHANES]    Frequency of Communication with Friends and Family: More than three times a week    Frequency of Social Gatherings with Friends and Family: More than three times a week    Attends Religious Services: More than 4 times per year    Active Member of [employer] or Organizations: No    Attends Archivist Meetings: Never    Marital Status: Widowed  Intimate Partner Violence: Not At Risk (07/20/2021)   Humiliation, Afraid, Rape, and Kick questionnaire    Fear of Current or Ex-Partner: No    Emotionally Abused: No    Physically Abused: No    Sexually Abused: No    Past Surgical History:  Procedure Laterality Date   BIOPSY  09/10/2018   Procedure: BIOPSY;  Surgeon: Jerene Bears, MD;  Location: [facility];  Service: Gastroenterology;;   CATARACT EXTRACTION Right 10/06/2013   CORONARY ARTERY BYPASS GRAFT     2 VD;anomalous RCA;post op compliacted by AF   ESOPHAGOGASTRODUODENOSCOPY (EGD) WITH PROPOFOL N/A 09/10/2018   Procedure: ESOPHAGOGASTRODUODENOSCOPY (EGD) WITH PROPOFOL;  Surgeon: Jerene Bears, MD;  Location: [facility];  Service: Gastroenterology;  Laterality: N/A;   HIP FRACTURE SURGERY Right 03/2007   Dr. Salvadore Farber   INGUINAL HERNIA REPAIR Left 2012   Dr Brantley Stage   KNEE SURGERY Right 1977   repair   [place]  09/19/2013   [facility]    Family History  Problem Relation Age of  Onset   Stroke Mother    Pancreatic cancer Father        Deceased, 42   Breast cancer Sister        Deceased   Heart disease Sister 42   Stroke Maternal Aunt    Colon cancer Neg Hx    Prostate cancer Neg Hx    Esophageal cancer Neg Hx    Rectal cancer Neg Hx    Stomach cancer Neg Hx     Allergies  Allergen Reactions   Amoxicillin Diarrhea, Nausea And Vomiting and Other (See Comments)  Patient questions this   Zoloft [Sertraline Hcl]     Patient didn't like the way it made him feel   Remeron [Mirtazapine] Other (See Comments)    Caused excessive drowsiness    Current Outpatient Medications on File Prior to Visit  Medication Sig Dispense Refill   acetaminophen (TYLENOL) 650 MG CR tablet Take 325-650 mg by mouth every 8 (eight) hours as needed for pain.     aspirin EC 81 MG tablet Take 81 mg by mouth daily.     calcium carbonate (TUMS - DOSED IN MG ELEMENTAL CALCIUM) 500 MG chewable tablet Chew 1 tablet by mouth daily.     Cholecalciferol (VITAMIN D-3) 25 MCG (1000 UT) CAPS Take 1,000 Units by mouth daily with breakfast.     denosumab (PROLIA) 60 MG/ML SOSY injection Inject 60 mg into the skin every 6 (six) months.     ELIQUIS 5 MG TABS tablet TAKE ONE (1) TABLET BY MOUTH TWO (2) TIMES DAILY 60 tablet 5   famotidine-calcium carbonate-magnesium hydroxide (PEPCID COMPLETE) 10-800-165 MG chewable tablet Chew 1 tablet by mouth daily as needed.     folic acid (FOLVITE) 1 MG tablet TAKE ONE (1) TABLET BY MOUTH EACH DAY 90 tablet 2   loratadine (CLARITIN) 10 MG tablet Take 10 mg by mouth daily as needed for allergies.     Multiple Vitamin (MULITIVITAMIN WITH MINERALS) TABS Take 1 tablet by mouth daily.     nitroGLYCERIN (NITROSTAT) 0.4 MG SL tablet DISSOLVE 1 TABLET UNDER TONGUE EVERY 5 MINUTES FOR UP TO 3 DOSES AS NEEDED FOR CHEST PAIN 25 tablet 9   ondansetron (ZOFRAN-ODT) 4 MG disintegrating tablet Take 1 tablet (4 mg total) by mouth and melt on the tongue every 4 (four) hours as  needed for nausea/vomiting. 20 tablet 0   rosuvastatin (CRESTOR) 10 MG tablet TAKE 1 TABLET BY MOUTH DAILY 90 tablet 2   No current facility-administered medications on file prior to visit.    BP 118/60   Pulse 85   Temp 97.9 F (36.6 C)   Resp 18   Ht _0  (1.88 m)   Wt 143 lb (64.9 kg)   SpO2 99%   BMI 18.36 kg/m          Objective:   Physical Exam  General Mental Status- Alert. General Appearance- Not in acute distress.   Skin General: Color- Normal Color. Moisture- Normal Moisture.  Neck Carotid Arteries- Normal color. Moisture- Normal Moisture. No carotid bruits. No JVD.  Chest and Lung Exam Auscultation: Breath Sounds:-Normal.  Cardiovascular Auscultation:Rythm- Regular. Murmurs & Other Heart Sounds:Auscultation of the heart reveals- No Murmurs.  Abdomen Inspection:-Inspeection Normal. Palpation/Percussion:Note:No mass. Palpation and Percussion of the abdomen reveal- Non Tender, Non Distended + BS, no rebound or guarding.   Neurologic Cranial Nerve exam:- CN III-XII intact(No nystagmus), symmetric smile. Strength:- 5/5 equal and symmetric strength both upper and lower extremities.       Assessment & Plan:   Patient Instructions  Chills, fatigue and very random mild cough one month out post covid infection. Will get cbc, cmp and chest xray.  After review of labs and xray may decide on possibly giving antibiotics.   For nausea will get lipase protein on labs. Continue zofran if needed and make sure well hydrated.  For upset stomach and belching recommend using famotadine otc 20 mg 1-2 tabs daily. Can get back on probiotics.   Please make sure you are well hydrated.   Follow up in 2-3 weeks or sooner  if needed.      Mackie Pai, PA-C

## 2022-07-25 NOTE — Progress Notes (Signed)
Subjective:   [name] is a 88 y.o. male who presents for Medicare Annual/Subsequent preventive examination.  Review of Systems    Defer to PCP Cardiac Risk Factors include: advanced age (>41mn, >>32women);male gender;dyslipidemia     Objective:    Today's Vitals   07/25/22 1308  BP: (!) 97/59  Pulse: 85  Temp: 97.9 F (36.6 C)  SpO2: 99%  Weight: 143 lb 12.8 oz (65.2 kg)  Height: _0  (1.88 m)   Body mass index is 18.46 kg/m.     07/25/2022    1:17 PM 06/20/2022    6:11 AM 07/23/2021    2:59 PM 07/20/2021    3:07 PM 05/09/2020    7:05 PM 03/21/2020    6:12 PM 11/24/2019   11:16 AM  Advanced Directives  Does Patient Have a Medical Advance Directive? Yes Yes No Yes No No Yes  Type of [place] Living will  Living will   [place]  Does patient want to make changes to medical advance directive? No - Patient declined      No - Patient declined  Copy of [place]roadwayin Chart? No - copy requested      Yes - validated most recent copy scanned in chart (See row information)  Would patient like information on creating a medical advance directive?   No - Patient declined  Yes (ED - Information included in AVS)      Current Medications (verified) Outpatient Encounter Medications as of 07/25/2022  Medication Sig   acetaminophen (TYLENOL) 650 MG CR tablet Take 325-650 mg by mouth every 8 (eight) hours as needed for pain.   aspirin EC 81 MG tablet Take 81 mg by mouth daily.   calcium carbonate (TUMS - DOSED IN MG ELEMENTAL CALCIUM) 500 MG chewable tablet Chew 1 tablet by mouth daily.   Cholecalciferol (VITAMIN D-3) 25 MCG (1000 UT) CAPS Take 1,000 Units by mouth daily with breakfast.   denosumab (PROLIA) 60 MG/ML SOSY injection Inject 60 mg into the skin every 6 (six) months.   ELIQUIS 5 MG TABS tablet TAKE ONE (1) TABLET BY MOUTH TWO (2) TIMES DAILY   famotidine-calcium  carbonate-magnesium hydroxide (PEPCID COMPLETE) 10-800-165 MG chewable tablet Chew 1 tablet by mouth daily as needed.   folic acid (FOLVITE) 1 MG tablet TAKE ONE (1) TABLET BY MOUTH EACH DAY   loratadine (CLARITIN) 10 MG tablet Take 10 mg by mouth daily as needed for allergies.   Multiple Vitamin (MULITIVITAMIN WITH MINERALS) TABS Take 1 tablet by mouth daily.   nitroGLYCERIN (NITROSTAT) 0.4 MG SL tablet DISSOLVE 1 TABLET UNDER TONGUE EVERY 5 MINUTES FOR UP TO 3 DOSES AS NEEDED FOR CHEST PAIN   ondansetron (ZOFRAN-ODT) 4 MG disintegrating tablet Take 1 tablet (4 mg total) by mouth and melt on the tongue every 4 (four) hours as needed for nausea/vomiting.   rosuvastatin (CRESTOR) 10 MG tablet TAKE 1 TABLET BY MOUTH DAILY   No facility-administered encounter medications on file as of 07/25/2022.    Allergies (verified) Amoxicillin, Zoloft [sertraline hcl], and Remeron [mirtazapine]   History: Past Medical History:  Diagnosis Date   Anxiety    Atrial fibrillation (HStokes    Blood transfusion without reported diagnosis    CAD    a. 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 // Myoview 9/22: EF 57, normal perfusion;  low risk   Cataract    Chronic abdominal pain    Chronic nausea    Coronary artery disease    Diverticulosis 2011   Diverticulitis 2004   Dyspepsia    Esophageal reflux    GERD (gastroesophageal reflux disease)    Headache    saw neuro 4-16, MRI done   History of echocardiogram    Echo 9/22: EF 60-65, no RWMA, normal diastolic function, GLS -87.5, normal RVSF, normal PASP, mild-moderate BAE, trivial MR, moderate MAC, moderate AV calcification, mild AI, mild aortic stenosis (mean gradient 5 mmHg, V-max 158 cm/s, DI 0.62), mild dilation of ascending aorta (40 mm)   HYPERPLASIA, PROSTATE NOS W/URINARY OBST/LUTS    IBS    INGUINAL HERNIA    Lichen planus [id number]   [name]   LUMBAR RADICULOPATHY, RIGHT     Mixed hyperlipidemia    OSTEOPOROSIS    PONV (postoperative nausea and vomiting)    PREMATURE VENTRICULAR CONTRACTIONS    a. After CABG - was on Coumadin/Multaq for a period of time. Coumadin discontinued 04/2010 after maintaining NSR.   UNSPECIFIED ANEMIA    Past Surgical History:  Procedure Laterality Date   BIOPSY  09/10/2018   Procedure: BIOPSY;  Surgeon: Jerene Bears, MD;  Location: [facility];  Service: Gastroenterology;;   CATARACT EXTRACTION Right 10/06/2013   CORONARY ARTERY BYPASS GRAFT     2 VD;anomalous RCA;post op compliacted by AF   ESOPHAGOGASTRODUODENOSCOPY (EGD) WITH PROPOFOL N/A 09/10/2018   Procedure: ESOPHAGOGASTRODUODENOSCOPY (EGD) WITH PROPOFOL;  Surgeon: Jerene Bears, MD;  Location: [facility];  Service: Gastroenterology;  Laterality: N/A;   HIP FRACTURE SURGERY Right 03/2007   Dr. Salvadore Farber   INGUINAL HERNIA REPAIR Left 2012   Dr Brantley Stage   KNEE SURGERY Right 1977   repair   [place]  09/19/2013   [facility]   Family History  Problem Relation Age of Onset   Stroke Mother    Pancreatic cancer Father        Deceased, 10   Breast cancer Sister        Deceased   Heart disease Sister 24   Stroke Maternal Aunt    Colon cancer Neg Hx    Prostate cancer Neg Hx    Esophageal cancer Neg Hx    Rectal cancer Neg Hx    Stomach cancer Neg Hx    Social History   Socioeconomic History   Marital status: Widowed    Spouse name: Not on file   Number of children: 0   Years of education: Not on file   Highest education level: Not on file  Occupational History   Occupation: retired    Fish farm manager: RETIRED  Tobacco Use   Smoking status: Former    Types: Cigarettes    Quit date: 11/27/1962    Years since quitting: 59.6   Smokeless tobacco: Never  Vaping Use   Vaping Use: Never used  Substance and Sexual Activity   Alcohol use: Not Currently    Alcohol/week: 0.0 standard drinks of alcohol    Comment: quit drinking    Drug use: No   Sexual  activity: Not Currently  Other Topics Concern   Not on file  Social History Narrative   Lives at [facility] in independent living.     Lost wife 06-2016.     Has no children.  Family: nephews-nices in [place]   Retired from [employer].     Still drives.    Social Determinants  of Health   Financial Resource Strain: Low Risk  (10/31/2021)   Overall Financial Resource Strain (CARDIA)    Difficulty of Paying Living Expenses: Not very hard  Food Insecurity: No Food Insecurity (07/19/2022)   Hunger Vital Sign    Worried About Running Out of Food in the Last Year: Never true    Ran Out of Food in the Last Year: Never true  Transportation Needs: No Transportation Needs (07/19/2022)   PRAPARE - Hydrologist (Medical): No    Lack of Transportation (Non-Medical): No  Physical Activity: Sufficiently Active (05/04/2021)   Exercise Vital Sign    Days of Exercise per Week: 7 days    Minutes of Exercise per Session: 40 min  Stress: No Stress Concern Present (07/20/2021)   [place]    Feeling of Stress : Not at all  Social Connections: Moderately Isolated (07/20/2021)   Social Connection and Isolation Panel [NHANES]    Frequency of Communication with Friends and Family: More than three times a week    Frequency of Social Gatherings with Friends and Family: More than three times a week    Attends Religious Services: More than 4 times per year    Active Member of [employer] or Organizations: No    Attends Archivist Meetings: Never    Marital Status: Widowed    Tobacco Counseling Counseling given: Not Answered   Clinical Intake:  Pre-visit preparation completed: Yes  Pain : No/denies pain     BMI - recorded: 18.46 Nutritional Risks: Unintentional weight loss Diabetes: No  How often do you need to have someone help you when you read instructions, pamphlets, or other written materials  from your doctor or pharmacy?: 1 - Never  Diabetic? No  Interpreter Needed?: No  Information entered by :: Beatris Ship, [place]   Activities of Daily Living    07/25/2022    1:21 PM  In your present state of health, do you have any difficulty performing the following activities:  Hearing? 1  Vision? 0  Difficulty concentrating or making decisions? 0  Walking or climbing stairs? 1  Comment uses rolling walking  Dressing or bathing? 0  Doing errands, shopping? 0  Preparing Food and eating ? N  Using the Toilet? N  In the past six months, have you accidently leaked urine? N  Do you have problems with loss of bowel control? N  Managing your Medications? N  Managing your Finances? N  Housekeeping or managing your Housekeeping? N    Patient Care Team: Colon Branch, MD as PCP - General (Internal Medicine) Josue Hector, MD as PCP - Cardiology (Cardiology) Bjorn Loser, MD as Consulting Physician (Urology) Josue Hector, MD as Consulting Physician (Cardiology) Sydnee Levans, MD as Consulting Physician (Dermatology) Deneise Lever, MD as Consulting Physician (Pulmonary Disease) Alda Berthold, DO as Consulting Physician (Neurology) Inda Castle, MD (Inactive) as Consulting Physician (Gastroenterology) Clent Jacks, MD as Consulting Physician (Ophthalmology)  Indicate any recent Medical Services you may have received from other than [facility] providers in the past year (date may be approximate).     Assessment:   This is a routine wellness examination for [place].  Hearing/Vision screen No results found.  Dietary issues and exercise activities discussed: Current Exercise Habits: Home exercise routine, Type of exercise: walking, Time (Minutes): 20, Frequency (Times/Week): 7, Weekly Exercise (Minutes/Week): 140, Intensity: Mild, Exercise limited by: None identified   Goals  Addressed   None    Depression Screen    07/25/2022    1:26 PM 05/03/2022   10:07 AM  10/19/2021    1:01 PM 07/20/2021    3:18 PM 05/05/2021   10:46 AM 11/26/2019   10:56 AM 11/24/2019   11:25 AM  PHQ 2/9 Scores  PHQ - 2 Score _0 0 0 0  PHQ- 9 Score  _1 0     Fall Risk    07/25/2022    1:27 PM 10/19/2021    1:01 PM 07/20/2021    3:14 PM 05/05/2021   10:17 AM 05/04/2021    1:25 PM  Fall Risk   Falls in the past year? 0 0 0 0 0  Number falls in past yr: 0 0 0 0 0  Injury with Fall? 0 0 0 0 0  Risk for fall due to : No Fall Risks    Impaired balance/gait;Other (Comment)  Follow up Falls evaluation completed Falls evaluation completed Falls prevention discussed Falls evaluation completed Falls evaluation completed;Falls prevention discussed    FALL RISK PREVENTION PERTAINING TO THE HOME:  Any stairs in or around the home? Yes  If so, are there any without handrails? No  Home free of loose throw rugs in walkways, pet beds, electrical cords, etc? Yes  Adequate lighting in your home to reduce risk of falls? Yes   ASSISTIVE DEVICES UTILIZED TO PREVENT FALLS:  Life alert? No  Use of a cane, walker or w/c? Yes  Grab bars in the bathroom? Yes  Shower chair or bench in shower? Yes  Elevated toilet seat or a handicapped toilet? Yes   TIMED UP AND GO:  Was the test performed? Yes .  Length of time to ambulate 10 feet: 15 sec.   Gait slow and steady with assistive device  Cognitive Function:    08/21/2017    1:35 PM  MMSE - Mini Mental State Exam  Orientation to time 5  Orientation to Place 5  Registration 3  Attention/ Calculation 5  Recall 3  Language- name 2 objects 2  Language- repeat 1  Language- follow 3 step command 3  Language- read & follow direction 1  Write a sentence 1  Copy design 1  Total score 30        07/25/2022    1:30 PM  6CIT Screen  What Year? 0 points  What month? 0 points  What time? 0 points  Count back from 20 4 points  Months in reverse 0 points  Repeat phrase 0 points  Total Score 4 points     Immunizations Immunization History  Administered Date(s) Administered   Influenza Whole 09/05/2007, 08/13/2009, 08/27/2012   Influenza, High Dose Seasonal PF 08/20/2018, 10/02/2019, 08/30/2020, 08/29/2021   Influenza,inj,Quad PF,6+ Mos 08/25/2016   Influenza-Unspecified 08/26/2013, 08/28/2015, 08/28/2017   Moderna Sars-Covid-2 Vaccination 12/10/2019, 02/05/2020, 09/29/2020   PPD Test 05/16/2011   Pneumococcal Conjugate-13 05/17/2015   Pneumococcal Polysaccharide-23 05/18/2011, 05/05/2021   Tdap 05/16/2011, 04/03/2022   Zoster Recombinat (Shingrix) 05/20/2020, 08/26/2020    TDAP status: Up to date  Flu Vaccine status: Up to date  Pneumococcal vaccine status: Up to date  Covid-19 vaccine status: Information provided on how to obtain vaccines.   Qualifies for Shingles Vaccine? Yes   Zostavax completed No   Shingrix Completed?: Yes  Screening Tests Health Maintenance  Topic Date Due   COVID-19 Vaccine (4 - Moderna risk series) 11/24/2020   INFLUENZA VACCINE  06/27/2022  TETANUS/TDAP  04/03/2032   Pneumonia Vaccine 20+ Years old  Completed   Zoster Vaccines- Shingrix  Completed   HPV VACCINES  Aged Out    Health Maintenance  Health Maintenance Due  Topic Date Due   COVID-19 Vaccine (4 - Moderna risk series) 11/24/2020   INFLUENZA VACCINE  06/27/2022    Colorectal cancer screening: No longer required.   Lung Cancer Screening: (Low Dose CT Chest recommended if Age 16-80 years, 30 pack-year currently smoking OR have quit w/in 15years.) does not qualify.   Lung Cancer Screening Referral: N/a  Additional Screening:  Hepatitis C Screening: does not qualify; Completed N/a  Vision Screening: Recommended annual ophthalmology exams for early detection of glaucoma and other disorders of the eye. Is the patient up to date with their annual eye exam?  Yes  Who is the provider or what is the name of the office in which the patient attends annual eye exams? [facility] If pt is not established with a provider, would they like to be referred to a provider to establish care? No .   Dental Screening: Recommended annual dental exams for proper oral hygiene  Community Resource Referral / Chronic Care Management: CRR required this visit?  No   CCM required this visit?  No      Plan:     I have personally reviewed and noted the following in the patient's chart:   Medical and social history Use of alcohol, tobacco or illicit drugs  Current medications and supplements including opioid prescriptions. Patient is not currently taking opioid prescriptions. Functional ability and status Nutritional status Physical activity Advanced directives List of other physicians Hospitalizations, surgeries, and ER visits in previous 12 months Vitals Screenings to include cognitive, depression, and falls Referrals and appointments  In addition, I have reviewed and discussed with patient certain preventive protocols, quality metrics, and best practice recommendations. A written personalized care plan for preventive services as well as general preventive health recommendations were provided to patient.     Beatris Ship, [place]   07/25/2022   Nurse Notes: Note

## 2022-07-25 NOTE — Telephone Encounter (Signed)
Last Prolia inj 07/21/22 Next Prolia inj due 01/22/23 

## 2022-07-25 NOTE — Patient Instructions (Addendum)
Chills, fatigue and very random mild cough one month out post covid infection. Will get cbc, cmp and chest xray.  After review of labs and xray may decide on possibly giving antibiotics.   For nausea will get lipase protein on labs. Continue zofran if needed and make sure well hydrated.  For upset stomach and belching recommend using famotadine otc 20 mg 1-2 tabs daily. Can get back on probiotics.   Please make sure you are well hydrated.   Follow up in 2-3 weeks or sooner if needed.

## 2022-07-25 NOTE — Patient Instructions (Signed)
[name] , Thank you for taking time to come for your Medicare Wellness Visit. I appreciate your ongoing commitment to your health goals. Please review the following plan we discussed and let me know if I can assist you in the future.   These are the goals we discussed:  Goals       Care Coordination-continue to Improve post Covid infection      Care Coordination Interventions: Assessed social determinant of health barriers Reviewed upcoming scheduled appointments and encouraged to attend as scheduled Confirmed patient has transportation to appointments Care Gaps reviewed Discussed appetite and activity level Encouraged to contact provider if condition does not continue to improve or worsens      Chronic Care Management Pharmacy Care Plan      CARE PLAN ENTRY (see longitudinal plan of care for additional care plan information)  Current Barriers:  Chronic Disease Management support, education, and care coordination needs related to Afib, Pre-Diabetes, Hyperlipidemia/CAD, GERD, Osteoporosis, Allergic Rhinitis, Arthritis    Hyperlipidemia Lab Results  Component Value Date/Time   LDLCALC 19 05/05/2021 10:45 AM  Pharmacist Clinical Goal(s): Over the next 180 days, patient will work with PharmD and providers to maintain LDL goal < 70 Current regimen:  Rosuvastatin '10mg'$  daily Patient self care activities - Over the next 180 days, patient will: Maintain cholesterol medication regimen.   Pre-Diabetes Lab Results  Component Value Date/Time   HGBA1C 6.0 10/19/2021 01:31 PM   HGBA1C 6.1 05/05/2021 10:45 AM  Pharmacist Clinical Goal(s): Over the next 180 days, patient will work with PharmD and providers to maintain A1c goal <6.5% Current regimen:  Diet and exercise management    Interventions: Discussed diet extensively Discussed exercise Patient self care activities - Over the next 180 days, patient will: Maintain a1c <6.5%  Hyperkalemia Pharmacist Clinical Goal(s) Over the  next 180 days, patient will work with PharmD and providers to monitor potassium  Interventions: Discussed last potassium level - slightly elevated at 5.3 Discussed foods that can increase potassium Patient self care activities - Over the next 180 days, patient will: Cut back on foods high in potassium  Hold Multivitamin to see if potassium remains in normal range.  Consider recheck potassium in 4 to 6 weeks  Osteoporosis:  Pharmacist Clinical Goal(s) Over the next 180 days, patient will work with PharmD and providers decrease fall risk and increase bone density Current Therapy:  Prolia '60mg'$  - receives at endocrinilogist office every 6 months - next due 05/14/2022 or after Vitamin D 1000 IU daily  Calcium / Tums '500mg'$  daily Interventions: Recommended continue current calcium and vitamin D supplementation and Prolia Discussed importance of continued calcium and vitamin d supplementation Patient self care activities - Over the next 180 days, patient will: Continue current therapy for bone health and to prevent fractures Continue weight bearing exercise every day - walking, weight lifting. Great job! Contact Dr Arman Filter office for Prolia appointment - will be due around 05/14/2022    Medication management Pharmacist Clinical Goal(s): Over the next 180 days, patient will work with PharmD and providers to maintain optimal medication adherence Current pharmacy: [facility] Comprehensive medication review performed. Continue current medication management strategy Collaboration with provider regarding medication management (folic acid indication) Patient self care activities - Over the next 180 days, patient will: Focus on medication adherence by filling and taking medications appropriately  Take medications as prescribed Report any questions or concerns to PharmD and/or provider(s)  Please see past updates related to this goal by clicking  on the "Past Updates" button  in the selected goal        Maintain current health (pt-stated)        This is a list of the screening recommended for you and due dates:  Health Maintenance  Topic Date Due   COVID-19 Vaccine (4 - Moderna risk series) 11/24/2020   Flu Shot  06/27/2022   Tetanus Vaccine  04/03/2032   Pneumonia Vaccine  Completed   Zoster (Shingles) Vaccine  Completed   HPV Vaccine  Aged Out       Next appointment: Follow up in one year for your annual wellness visit.   Preventive Care 57 Years and Older, Male Preventive care refers to lifestyle choices and visits with your health care provider that can promote health and wellness. What does preventive care include? A yearly physical exam. This is also called an annual well check. Dental exams once or twice a year. Routine eye exams. Ask your health care provider how often you should have your eyes checked. Personal lifestyle choices, including: Daily care of your teeth and gums. Regular physical activity. Eating a healthy diet. Avoiding tobacco and drug use. Limiting alcohol use. Practicing safe sex. Taking low doses of aspirin every day. Taking vitamin and mineral supplements as recommended by your health care provider. What happens during an annual well check? The services and screenings done by your health care provider during your annual well check will depend on your age, overall health, lifestyle risk factors, and family history of disease. Counseling  Your health care provider may ask you questions about your: Alcohol use. Tobacco use. Drug use. Emotional well-being. Home and relationship well-being. Sexual activity. Eating habits. History of falls. Memory and ability to understand (cognition). Work and work Statistician. Screening  You may have the following tests or measurements: Height, weight, and BMI. Blood pressure. Lipid and cholesterol levels. These may be checked every 5 years, or more frequently if you are over  24 years old. Skin check. Lung cancer screening. You may have this screening every year starting at age 33 if you have a 30-pack-year history of smoking and currently smoke or have quit within the past 15 years. Fecal occult blood test (FOBT) of the stool. You may have this test every year starting at age 38. Flexible sigmoidoscopy or colonoscopy. You may have a sigmoidoscopy every 5 years or a colonoscopy every 10 years starting at age 70. Prostate cancer screening. Recommendations will vary depending on your family history and other risks. Hepatitis C blood test. Hepatitis B blood test. Sexually transmitted disease (STD) testing. Diabetes screening. This is done by checking your blood sugar (glucose) after you have not eaten for a while (fasting). You may have this done every 1-3 years. Abdominal aortic aneurysm (AAA) screening. You may need this if you are a current or former smoker. Osteoporosis. You may be screened starting at age 47 if you are at high risk. Talk with your health care provider about your test results, treatment options, and if necessary, the need for more tests. Vaccines  Your health care provider may recommend certain vaccines, such as: Influenza vaccine. This is recommended every year. Tetanus, diphtheria, and acellular pertussis (Tdap, Td) vaccine. You may need a Td booster every 10 years. Zoster vaccine. You may need this after age 31. Pneumococcal 13-valent conjugate (PCV13) vaccine. One dose is recommended after age 8. Pneumococcal polysaccharide (PPSV23) vaccine. One dose is recommended after age 4. Talk to your health care provider about which screenings  and vaccines you need and how often you need them. This information is not intended to replace advice given to you by your health care provider. Make sure you discuss any questions you have with your health care provider. Document Released: 12/10/2015 Document Revised: 08/02/2016 Document Reviewed:  09/14/2015 Elsevier Interactive Patient Education  2017 [place] Prevention in the Home Falls can cause injuries. They can happen to people of all ages. There are many things you can do to make your home safe and to help prevent falls. What can I do on the outside of my home? Regularly fix the edges of walkways and driveways and fix any cracks. Remove anything that might make you trip as you walk through a door, such as a raised step or threshold. Trim any bushes or trees on the path to your home. Use bright outdoor lighting. Clear any walking paths of anything that might make someone trip, such as rocks or tools. Regularly check to see if handrails are loose or broken. Make sure that both sides of any steps have handrails. Any raised decks and porches should have guardrails on the edges. Have any leaves, snow, or ice cleared regularly. Use sand or salt on walking paths during winter. Clean up any spills in your garage right away. This includes oil or grease spills. What can I do in the bathroom? Use night lights. Install grab bars by the toilet and in the tub and shower. Do not use towel bars as grab bars. Use non-skid mats or decals in the tub or shower. If you need to sit down in the shower, use a plastic, non-slip stool. Keep the floor dry. Clean up any water that spills on the floor as soon as it happens. Remove soap buildup in the tub or shower regularly. Attach bath mats securely with double-sided non-slip rug tape. Do not have throw rugs and other things on the floor that can make you trip. What can I do in the bedroom? Use night lights. Make sure that you have a light by your bed that is easy to reach. Do not use any sheets or blankets that are too big for your bed. They should not hang down onto the floor. Have a firm chair that has side arms. You can use this for support while you get dressed. Do not have throw rugs and other things on the floor that can make you  trip. What can I do in the kitchen? Clean up any spills right away. Avoid walking on wet floors. Keep items that you use a lot in easy-to-reach places. If you need to reach something above you, use a strong step stool that has a grab bar. Keep electrical cords out of the way. Do not use floor polish or wax that makes floors slippery. If you must use wax, use non-skid floor wax. Do not have throw rugs and other things on the floor that can make you trip. What can I do with my stairs? Do not leave any items on the stairs. Make sure that there are handrails on both sides of the stairs and use them. Fix handrails that are broken or loose. Make sure that handrails are as long as the stairways. Check any carpeting to make sure that it is firmly attached to the stairs. Fix any carpet that is loose or worn. Avoid having throw rugs at the top or bottom of the stairs. If you do have throw rugs, attach them to the floor with carpet tape.  Make sure that you have a light switch at the top of the stairs and the bottom of the stairs. If you do not have them, ask someone to add them for you. What else can I do to help prevent falls? Wear shoes that: Do not have high heels. Have rubber bottoms. Are comfortable and fit you well. Are closed at the toe. Do not wear sandals. If you use a stepladder: Make sure that it is fully opened. Do not climb a closed stepladder. Make sure that both sides of the stepladder are locked into place. Ask someone to hold it for you, if possible. Clearly mark and make sure that you can see: Any grab bars or handrails. First and last steps. Where the edge of each step is. Use tools that help you move around (mobility aids) if they are needed. These include: Canes. Walkers. Scooters. Crutches. Turn on the lights when you go into a dark area. Replace any light bulbs as soon as they burn out. Set up your furniture so you have a clear path. Avoid moving your furniture  around. If any of your floors are uneven, fix them. If there are any pets around you, be aware of where they are. Review your medicines with your doctor. Some medicines can make you feel dizzy. This can increase your chance of falling. Ask your doctor what other things that you can do to help prevent falls. This information is not intended to replace advice given to you by your health care provider. Make sure you discuss any questions you have with your health care provider. Document Released: 09/09/2009 Document Revised: 04/20/2016 Document Reviewed: 12/18/2014 Elsevier Interactive Patient Education  2017 [employer].

## 2022-07-26 LAB — CBC WITH DIFFERENTIAL/PLATELET
Basophils Absolute: 0.1 K/uL (ref 0.0–0.1)
Basophils Relative: 0.8 % (ref 0.0–3.0)
Eosinophils Absolute: 0.3 K/uL (ref 0.0–0.7)
Eosinophils Relative: 3.9 % (ref 0.0–5.0)
HCT: 38.5 % — ABNORMAL LOW (ref 39.0–52.0)
Hemoglobin: 12.9 g/dL — ABNORMAL LOW (ref 13.0–17.0)
Lymphocytes Relative: 16.5 % (ref 12.0–46.0)
Lymphs Abs: 1.4 K/uL (ref 0.7–4.0)
MCHC: 33.6 g/dL (ref 30.0–36.0)
MCV: 93.8 fl (ref 78.0–100.0)
Monocytes Absolute: 1 K/uL (ref 0.1–1.0)
Monocytes Relative: 11.5 % (ref 3.0–12.0)
Neutro Abs: 5.7 K/uL (ref 1.4–7.7)
Neutrophils Relative %: 67.3 % (ref 43.0–77.0)
Platelets: 268 K/uL (ref 150.0–400.0)
RBC: 4.1 Mil/uL — ABNORMAL LOW (ref 4.22–5.81)
RDW: 16.9 % — ABNORMAL HIGH (ref 11.5–15.5)
WBC: 8.5 K/uL (ref 4.0–10.5)

## 2022-07-26 LAB — COMPREHENSIVE METABOLIC PANEL
ALT: 10 U/L (ref 0–53)
AST: 18 U/L (ref 0–37)
Albumin: 3.8 g/dL (ref 3.5–5.2)
Alkaline Phosphatase: 46 U/L (ref 39–117)
BUN: 15 mg/dL (ref 6–23)
CO2: 27 mEq/L (ref 19–32)
Calcium: 9.2 mg/dL (ref 8.4–10.5)
Chloride: 101 mEq/L (ref 96–112)
Creatinine, Ser: 0.72 mg/dL (ref 0.40–1.50)
GFR: 80.82 mL/min (ref >60.00)
Glucose, Bld: 98 mg/dL (ref 70–99)
Potassium: 5.1 mEq/L (ref 3.5–5.1)
Sodium: 136 mEq/L (ref 135–145)
Total Bilirubin: 0.6 mg/dL (ref 0.2–1.2)
Total Protein: 6.6 g/dL (ref 6.0–8.3)

## 2022-07-26 LAB — LIPASE: Lipase: 27 U/L (ref 11.0–59.0)

## 2022-07-26 LAB — VITAMIN B12: Vitamin B-12: 470 pg/mL (ref 211–911)

## 2022-07-26 NOTE — Addendum Note (Signed)
Addended by: Anabel Halon on: 07/26/2022 12:51 PM   Modules accepted: Orders

## 2022-07-27 ENCOUNTER — Other Ambulatory Visit (INDEPENDENT_AMBULATORY_CARE_PROVIDER_SITE_OTHER): Payer: Medicare Other

## 2022-07-27 DIAGNOSIS — R2689 Other abnormalities of gait and mobility: Secondary | ICD-10-CM | POA: Diagnosis not present

## 2022-07-27 DIAGNOSIS — D649 Anemia, unspecified: Secondary | ICD-10-CM

## 2022-07-27 DIAGNOSIS — U071 COVID-19: Secondary | ICD-10-CM | POA: Diagnosis not present

## 2022-07-27 LAB — IBC + FERRITIN
Ferritin: 73.8 ng/mL (ref 22.0–322.0)
Iron: 26 ug/dL — ABNORMAL LOW (ref 42–165)
Saturation Ratios: 9.8 % — ABNORMAL LOW (ref 20.0–50.0)
TIBC: 264.6 ug/dL (ref 250.0–450.0)
Transferrin: 189 mg/dL — ABNORMAL LOW (ref 212.0–360.0)

## 2022-07-27 MED ORDER — IRON (FERROUS SULFATE) 325 (65 FE) MG PO TABS: ORAL_TABLET | ORAL | 0 refills | Status: DC

## 2022-07-27 NOTE — Addendum Note (Signed)
Addended by: Anabel Halon on: 07/27/2022 07:19 PM   Modules accepted: Orders

## 2022-07-27 NOTE — Addendum Note (Signed)
Addended by: Kelle Darting A on: 07/27/2022 01:15 PM   Modules accepted: Orders

## 2022-07-31 DIAGNOSIS — U071 COVID-19: Secondary | ICD-10-CM | POA: Diagnosis not present

## 2022-07-31 DIAGNOSIS — R2689 Other abnormalities of gait and mobility: Secondary | ICD-10-CM | POA: Diagnosis not present

## 2022-08-01 DIAGNOSIS — U071 COVID-19: Secondary | ICD-10-CM | POA: Diagnosis not present

## 2022-08-01 DIAGNOSIS — R2689 Other abnormalities of gait and mobility: Secondary | ICD-10-CM | POA: Diagnosis not present

## 2022-08-02 ENCOUNTER — Ambulatory Visit: Payer: Self-pay

## 2022-08-02 NOTE — Patient Outreach (Signed)
  Care Coordination   Follow Up Visit Note   08/02/2022 Name: [name] MRN: [id number] DOB: October 02, 1930  [name] is a 88 y.o. year old male who sees Larose Kells, Alda Berthold, MD for primary care. I spoke with  [name] by phone today.  What matters to the patients health and wellness today?  Patient reports he is feeling better, but tires easily. He states he has followed up with provider and is now on iron due to low hemoglobin. States continues to take physical therapy.   Goals Addressed             This Visit's Progress    Care Coordination-continue to Improve post Covid infection       Care Coordination Interventions: Reviewed upcoming appointments, encouraged to continue to attend as scheduled Discussed appetite Discussed activity level and importance of rest periods throughout the day as needed.  Medications reviewed and encouraged to take as prescribed Discussed foods high in iron. Provided education        SDOH assessments and interventions completed:  No     Care Coordination Interventions Activated:  Yes  Care Coordination Interventions:  Yes, provided   Follow up plan: Follow up call scheduled for 09/13/22    Encounter Outcome:  Pt. Visit Completed   Thea Silversmith, RN, MSN, BSN, [place] Coordinator [phone]

## 2022-08-03 DIAGNOSIS — R2689 Other abnormalities of gait and mobility: Secondary | ICD-10-CM | POA: Diagnosis not present

## 2022-08-03 DIAGNOSIS — U071 COVID-19: Secondary | ICD-10-CM | POA: Diagnosis not present

## 2022-08-07 DIAGNOSIS — R2689 Other abnormalities of gait and mobility: Secondary | ICD-10-CM | POA: Diagnosis not present

## 2022-08-07 DIAGNOSIS — U071 COVID-19: Secondary | ICD-10-CM | POA: Diagnosis not present

## 2022-08-09 DIAGNOSIS — R2689 Other abnormalities of gait and mobility: Secondary | ICD-10-CM | POA: Diagnosis not present

## 2022-08-09 DIAGNOSIS — U071 COVID-19: Secondary | ICD-10-CM | POA: Diagnosis not present

## 2022-08-10 DIAGNOSIS — R2689 Other abnormalities of gait and mobility: Secondary | ICD-10-CM | POA: Diagnosis not present

## 2022-08-10 DIAGNOSIS — U071 COVID-19: Secondary | ICD-10-CM | POA: Diagnosis not present

## 2022-08-14 DIAGNOSIS — U071 COVID-19: Secondary | ICD-10-CM | POA: Diagnosis not present

## 2022-08-14 DIAGNOSIS — R2689 Other abnormalities of gait and mobility: Secondary | ICD-10-CM | POA: Diagnosis not present

## 2022-08-15 ENCOUNTER — Ambulatory Visit: Payer: Medicare Other | Admitting: Internal Medicine

## 2022-08-16 DIAGNOSIS — R2689 Other abnormalities of gait and mobility: Secondary | ICD-10-CM | POA: Diagnosis not present

## 2022-08-16 DIAGNOSIS — U071 COVID-19: Secondary | ICD-10-CM | POA: Diagnosis not present

## 2022-08-17 DIAGNOSIS — R2689 Other abnormalities of gait and mobility: Secondary | ICD-10-CM | POA: Diagnosis not present

## 2022-08-17 DIAGNOSIS — U071 COVID-19: Secondary | ICD-10-CM | POA: Diagnosis not present

## 2022-08-21 DIAGNOSIS — R2689 Other abnormalities of gait and mobility: Secondary | ICD-10-CM | POA: Diagnosis not present

## 2022-08-21 DIAGNOSIS — U071 COVID-19: Secondary | ICD-10-CM | POA: Diagnosis not present

## 2022-08-23 DIAGNOSIS — R2689 Other abnormalities of gait and mobility: Secondary | ICD-10-CM | POA: Diagnosis not present

## 2022-08-23 DIAGNOSIS — U071 COVID-19: Secondary | ICD-10-CM | POA: Diagnosis not present

## 2022-08-24 ENCOUNTER — Other Ambulatory Visit: Payer: Self-pay | Admitting: Medical

## 2022-08-24 DIAGNOSIS — U071 COVID-19: Secondary | ICD-10-CM | POA: Diagnosis not present

## 2022-08-24 DIAGNOSIS — R2689 Other abnormalities of gait and mobility: Secondary | ICD-10-CM | POA: Diagnosis not present

## 2022-08-28 DIAGNOSIS — U071 COVID-19: Secondary | ICD-10-CM | POA: Diagnosis not present

## 2022-08-28 DIAGNOSIS — R2689 Other abnormalities of gait and mobility: Secondary | ICD-10-CM | POA: Diagnosis not present

## 2022-08-30 DIAGNOSIS — U071 COVID-19: Secondary | ICD-10-CM | POA: Diagnosis not present

## 2022-08-30 DIAGNOSIS — R2689 Other abnormalities of gait and mobility: Secondary | ICD-10-CM | POA: Diagnosis not present

## 2022-08-31 DIAGNOSIS — U071 COVID-19: Secondary | ICD-10-CM | POA: Diagnosis not present

## 2022-08-31 DIAGNOSIS — R2689 Other abnormalities of gait and mobility: Secondary | ICD-10-CM | POA: Diagnosis not present

## 2022-09-06 ENCOUNTER — Other Ambulatory Visit: Payer: Self-pay | Admitting: Cardiovascular Disease

## 2022-09-06 DIAGNOSIS — I480 Paroxysmal atrial fibrillation: Secondary | ICD-10-CM

## 2022-09-07 ENCOUNTER — Ambulatory Visit (INDEPENDENT_AMBULATORY_CARE_PROVIDER_SITE_OTHER): Payer: Medicare Other | Admitting: Internal Medicine

## 2022-09-07 ENCOUNTER — Encounter: Payer: Self-pay | Admitting: Internal Medicine

## 2022-09-07 VITALS — BP 126/70 | HR 83 | Temp 98.2°F | Resp 18 | Ht 74.0 in | Wt 146.0 lb

## 2022-09-07 DIAGNOSIS — R739 Hyperglycemia, unspecified: Secondary | ICD-10-CM | POA: Diagnosis not present

## 2022-09-07 DIAGNOSIS — R197 Diarrhea, unspecified: Secondary | ICD-10-CM | POA: Diagnosis not present

## 2022-09-07 DIAGNOSIS — D649 Anemia, unspecified: Secondary | ICD-10-CM

## 2022-09-07 DIAGNOSIS — Q7959 Other congenital malformations of abdominal wall: Secondary | ICD-10-CM | POA: Diagnosis not present

## 2022-09-07 DIAGNOSIS — R634 Abnormal weight loss: Secondary | ICD-10-CM | POA: Diagnosis not present

## 2022-09-07 LAB — CBC WITH DIFFERENTIAL/PLATELET
Basophils Absolute: 0.1 10*3/uL (ref 0.0–0.1)
Basophils Relative: 0.7 % (ref 0.0–3.0)
Eosinophils Absolute: 0.2 10*3/uL (ref 0.0–0.7)
Eosinophils Relative: 2.4 % (ref 0.0–5.0)
HCT: 40.5 % (ref 39.0–52.0)
Hemoglobin: 13.2 g/dL (ref 13.0–17.0)
Lymphocytes Relative: 17.6 % (ref 12.0–46.0)
Lymphs Abs: 1.5 10*3/uL (ref 0.7–4.0)
MCHC: 32.5 g/dL (ref 30.0–36.0)
MCV: 95.7 fl (ref 78.0–100.0)
Monocytes Absolute: 0.7 10*3/uL (ref 0.1–1.0)
Monocytes Relative: 8.8 % (ref 3.0–12.0)
Neutro Abs: 6 10*3/uL (ref 1.4–7.7)
Neutrophils Relative %: 70.5 % (ref 43.0–77.0)
Platelets: 175 10*3/uL (ref 150.0–400.0)
RBC: 4.24 Mil/uL (ref 4.22–5.81)
RDW: 18.3 % — ABNORMAL HIGH (ref 11.5–15.5)
WBC: 8.5 10*3/uL (ref 4.0–10.5)

## 2022-09-07 LAB — IBC + FERRITIN
Ferritin: 43.9 ng/mL (ref 22.0–322.0)
Iron: 55 ug/dL (ref 42–165)
Saturation Ratios: 21.1 % (ref 20.0–50.0)
TIBC: 260.4 ug/dL (ref 250.0–450.0)
Transferrin: 186 mg/dL — ABNORMAL LOW (ref 212.0–360.0)

## 2022-09-07 LAB — HEMOGLOBIN A1C: Hgb A1c MFr Bld: 5.9 % (ref 4.6–6.5)

## 2022-09-07 NOTE — Patient Instructions (Addendum)
Stop taking iron supplements  Pick up a container and bring a stool sample.  We are referring you to gastroenterologist for diarrhea and loose stools.  Vaccines I recommend:  Covid booster RSV vaccine Flu shot- high dose   GO TO THE LAB : Get the blood work     [place], [place] back for a checkup in 2  months

## 2022-09-07 NOTE — Telephone Encounter (Signed)
Eliquis '5mg'$  refill request received. Patient is 88 years old, weight-66.2kg, Crea-0.72 on 07/25/2022, Diagnosis-Afib, and last seen by Dr. Johnsie Cancel on 07/11/2022. Dose is appropriate based on dosing criteria. Will send in refill to requested pharmacy.

## 2022-09-07 NOTE — Assessment & Plan Note (Signed)
Multiple issues discussed today. COVID infection: Dx 06/20/2022, he felt really poorly for 3 weeks, he is "80% better".  No fever chills or cough. CV, A-fib. saw cardiology 06-2022, was on atrial fibrillation, no changes made. Mild anemia: Last hemoglobin slightly low, iron was low but ferritin was normal, was Rx iron. Plan: CBC, stop iron supplements, check iron panel. Diarrhea: Sudden onset of symptoms 05/05/2022, today reports no diarrhea but he still has loose stools and occasional mucus.  Previously, C. difficile was negative. Plan: GI referral, GI pathology panel Weight loss: Has been losing weight for a while,  exacerbated  by COVID episode,  however problem seems stable now.  His weight went from 143 pounds 6 weeks ago to 146 pounds. Last TFTs normal. Abdominal wall tenderness: See physical exam, has tender spot at the LLQ abdominal wall.  Not hernia on clinical grounds.  Patient was not aware of that tender area.  We agreed on observation, will call if the area gets much more tender or started growing in size.  Reassess in 2 month, consider CT abdomen  prediabetes: Check A1c Vaccine advice provided  RTC 1 months

## 2022-09-07 NOTE — Progress Notes (Signed)
Subjective:    Patient ID: [name], male    DOB: [birth year], 88 y.o.   MRN: [id number]  DOS:  09/07/2022 Type of visit - description: Follow-up.  Multiple issues.  Chronic medical problems were discussed.  Also, had COVID 05-2022, he recuperated at home, had home physical therapy.  He felt really poorly for 3 weeks but he is bouncing back.  He feels "80% better". Denies cough, fever or chills.  Diarrhea has been a problem this year, he now reports ongoing loose stools, occasionally they are watery. No nausea or vomiting.  No abdominal cramps.  No blood in the stools (+ occasional mucus).  Taking iron pills for a slightly low iron.  Occasional LUTS, mostly nocturia.  Wt Readings from Last 3 Encounters:  09/07/22 146 lb (66.2 kg)  07/25/22 143 lb (64.9 kg)  07/25/22 143 lb 12.8 oz (65.2 kg)   Review of Systems See above   Past Medical History:  Diagnosis Date   Anxiety    Atrial fibrillation ([place])    Blood transfusion without reported diagnosis    CAD    a. 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 // Myoview 9/22: EF 57, normal perfusion; low risk   Cataract    Chronic abdominal pain    Chronic nausea    Coronary artery disease    Diverticulosis 2011   Diverticulitis 2004   Dyspepsia    Esophageal reflux    GERD (gastroesophageal reflux disease)    Headache    saw neuro 4-16, MRI done   History of echocardiogram    Echo 9/22: EF 60-65, no RWMA, normal diastolic function, GLS -49.6, normal RVSF, normal PASP, mild-moderate BAE, trivial MR, moderate MAC, moderate AV calcification, mild AI, mild aortic stenosis (mean gradient 5 mmHg, V-max 158 cm/s, DI 0.62), mild dilation of ascending aorta (40 mm)   HYPERPLASIA, PROSTATE NOS W/URINARY OBST/LUTS    IBS    INGUINAL HERNIA    Lichen planus [id number]   [name]   LUMBAR RADICULOPATHY, RIGHT    Mixed hyperlipidemia    OSTEOPOROSIS    PONV  (postoperative nausea and vomiting)    PREMATURE VENTRICULAR CONTRACTIONS    a. After CABG - was on Coumadin/Multaq for a period of time. Coumadin discontinued 04/2010 after maintaining NSR.   UNSPECIFIED ANEMIA     Past Surgical History:  Procedure Laterality Date   BIOPSY  09/10/2018   Procedure: BIOPSY;  Surgeon: Jerene Bears, MD;  Location: [facility];  Service: Gastroenterology;;   CATARACT EXTRACTION Right 10/06/2013   CORONARY ARTERY BYPASS GRAFT     2 VD;anomalous RCA;post op compliacted by AF   ESOPHAGOGASTRODUODENOSCOPY (EGD) WITH PROPOFOL N/A 09/10/2018   Procedure: ESOPHAGOGASTRODUODENOSCOPY (EGD) WITH PROPOFOL;  Surgeon: Jerene Bears, MD;  Location: [facility];  Service: Gastroenterology;  Laterality: N/A;   HIP FRACTURE SURGERY Right 03/2007   Dr. Salvadore Farber   INGUINAL HERNIA REPAIR Left 2012   Dr Brantley Stage   KNEE SURGERY Right 1977   repair   [place]  09/19/2013   [facility]    Current Outpatient Medications  Medication Instructions   acetaminophen (TYLENOL) 325-650 mg, Oral, Every 8 hours PRN   aspirin EC 81 mg, Oral, Daily   calcium carbonate (TUMS - DOSED IN MG ELEMENTAL CALCIUM) 500 MG chewable tablet 1 tablet, Oral, Daily   denosumab (PROLIA) 60 mg, Subcutaneous, Every 6 months  ELIQUIS 5 MG TABS tablet TAKE ONE (1) TABLET BY MOUTH TWO (2) TIMES DAILY   famotidine-calcium carbonate-magnesium hydroxide (PEPCID COMPLETE) 10-800-165 MG chewable tablet 1 tablet, Oral, Daily PRN   FeroSul 325 mg, Oral, 2 times daily   folic acid (FOLVITE) 1 MG tablet TAKE ONE (1) TABLET BY MOUTH EACH DAY   loratadine (CLARITIN) 10 mg, Oral, Daily PRN   Multiple Vitamin (MULITIVITAMIN WITH MINERALS) TABS 1 tablet, Oral, Daily,     nitroGLYCERIN (NITROSTAT) 0.4 MG SL tablet DISSOLVE 1 TABLET UNDER TONGUE EVERY 5 MINUTES FOR UP TO 3 DOSES AS NEEDED FOR CHEST PAIN   ondansetron (ZOFRAN-ODT) 4 MG disintegrating tablet Take 1 tablet (4 mg total) by mouth and melt on the  tongue every 4 (four) hours as needed for nausea/vomiting.   rosuvastatin (CRESTOR) 10 MG tablet TAKE 1 TABLET BY MOUTH DAILY   Vitamin D-3 1,000 Units, Oral, Daily with breakfast       Objective:   Physical Exam BP 126/70   Pulse (!) 30   Temp 98.2 F (36.8 C) (Oral)   Resp 18   Ht _0  (1.88 m)   Wt 146 lb (66.2 kg)   SpO2 90%   BMI 18.75 kg/m  General:   Well developed, NAD, BMI noted. HEENT:  Normocephalic . Face symmetric, atraumatic Lungs:  CTA B Normal respiratory effort, no intercostal retractions, no accessory muscle use. Heart: Seems regular Abdomen: Not distended, soft, normal bowel sounds. At the [facility] has tender area about 2 x 1 cm in size, question of a soft lump there , seems to be on the abdominal wall.  No worse with Valsalva maneuver.  No obvious hernia.   Lower extremities: no pretibial edema bilaterally  Skin: Not pale. Not jaundice Neurologic:  alert & oriented X3.  Speech normal, gait appropriate for age and unassisted Psych--  Cognition and judgment appear intact.  Cooperative with normal attention span and concentration.  Behavior appropriate. No anxious or depressed appearing.      Assessment     Assessment Prediabetes, A1c 6.0 (2015) Hyperlipidemia Anxiety, depression:  -intol to zoloft, Rx lexapro 5 mg 07-2015: intolerant d/t nausea  -lost wife July 2017 CV: ---CAD--CABG 2011 ----P- Atrial fibrillation, PVCs.  Elliquis ; Multaq d/c d/t GI s/e ----Palpable Ao x years, [place] (-) for AAA 2005 COPD (per CXR 06-2015), PFTs 10-04-15 mild obstruction DJD  Gait-- uses a walker prn GI: --GERD, IBS, diverticulosis --Chronic nausea: 2019.  Work-up: 08-2018: Negative EGD, CT  and US abdomen. 12/2018 wnl Gastric empty stomach.  Symptoms decreased after Multaq stopped GU: elevated PSA, BPH , (-) bx 2014 Osteoporosis: -hip FX 2008 - dexa ~2005 --> rx fosamax, took x 1 year, dexa ~2010 was rec no fosamax at that time. Dexa 09-2015: T score -2.4 --> rx  fosamax, d/c d/t jaw pain 06-2017; took prolia #25 February 2018; T score (-) 30 December 2681, per Endo Lichen planus: Well-controlled per last FLP C Headache -- saw neurology 02-2015, had a MRI/MRA (-) COPD: PFTs 08-2020: Mild obstruction with some air trapping.  Insignificant response to bronchodilators.   PLAN Multiple issues discussed today. COVID infection: Dx 06/20/2022, he felt really poorly for 3 weeks, he is "80% better".  No fever chills or cough. CV, A-fib. saw cardiology 06-2022, was on atrial fibrillation, no changes made. Mild anemia: Last hemoglobin slightly low, iron was low but ferritin was normal, was Rx iron. Plan: CBC, stop iron supplements, check iron panel. Diarrhea: Sudden onset of symptoms 05/05/2022,  today reports no diarrhea but he still has loose stools and occasional mucus.  Previously, C. difficile was negative. Plan: GI referral, GI pathology panel Weight loss: Has been losing weight for a while,  exacerbated  by COVID episode,  however problem seems stable now.  His weight went from 143 pounds 6 weeks ago to 146 pounds. Last TFTs normal. Abdominal wall tenderness: See physical exam, has tender spot at the LLQ abdominal wall.  Not hernia on clinical grounds.  Patient was not aware of that tender area.  We agreed on observation, will call if the area gets much more tender or started growing in size.  Reassess in 2 month, consider CT abdomen  prediabetes: Check A1c Vaccine advice provided  RTC 1 months

## 2022-09-08 ENCOUNTER — Other Ambulatory Visit: Payer: Medicare Other

## 2022-09-08 DIAGNOSIS — R197 Diarrhea, unspecified: Secondary | ICD-10-CM

## 2022-09-12 LAB — GI PROFILE, STOOL, PCR

## 2022-09-13 ENCOUNTER — Ambulatory Visit: Payer: Self-pay

## 2022-09-13 NOTE — Patient Outreach (Signed)
  Care Coordination   Follow Up Visit Note   09/13/2022 Name: [name] MRN: [id number] DOB: 1930-06-01  [name] is a 88 y.o. year old male who sees Larose Kells, Alda Berthold, MD for primary care. I spoke with  [name] by phone today.  What matters to the patients health and wellness today?  Mr. Nixon reports he is doing well. Patient lives at [facility] and he reports food is provided. Reports he is eating better at this time. He states he is no longer active with Physical therapy stating, "I've graduated". Last office visit with PCP 09/07/22. He denies any questions or concerns. Patient is agreeable to care coordinator closing case. Patient is aware he can contact primary care provider and/or care coordinator for any care coordination needs in the future.   Goals Addressed             This Visit's Progress    COMPLETED: Care Coordination-continue to Improve post Covid infection       Care Coordination Interventions: Reviewed upcoming appointments, encouraged to continue to attend as scheduled Provided positive feedback regarding self management of health Encouraged to continue to contact provider with health questions or concerns.      SDOH assessments and interventions completed:  No  Care Coordination Interventions Activated:  Yes  Care Coordination Interventions:  Yes, provided   Follow up plan: No further intervention required.   Encounter Outcome:  Pt. Visit Completed   Thea Silversmith, RN, MSN, BSN, [place] Coordinator [phone]

## 2022-09-13 NOTE — Patient Instructions (Addendum)
Visit Information  Thank you for taking time to visit with me today. Please don't hesitate to contact me if I can be of assistance to you.   Following are the goals we discussed today:   Goals Addressed             This Visit's Progress    COMPLETED: Care Coordination-continue to Improve post Covid infection       Care Coordination Interventions: Reviewed upcoming appointments, encouraged to continue to attend as scheduled Provided positive feedback regarding self management of health Encouraged to continue to contact provider with health questions or concerns.      If you are experiencing a Mental Health or [place] or need someone to talk to, please call the Suicide and Crisis Lifeline: [phone]  Patient verbalizes understanding of instructions and care plan provided today and agrees to view in [place]. Active MyChart status and patient understanding of how to access instructions and care plan via MyChart confirmed with patient.     Thea Silversmith, RN, MSN, BSN, [place] Coordinator [phone]

## 2022-09-20 DIAGNOSIS — Z23 Encounter for immunization: Secondary | ICD-10-CM | POA: Diagnosis not present

## 2022-10-09 ENCOUNTER — Other Ambulatory Visit: Payer: Self-pay | Admitting: Cardiovascular Disease

## 2022-11-22 ENCOUNTER — Ambulatory Visit (INDEPENDENT_AMBULATORY_CARE_PROVIDER_SITE_OTHER): Payer: Medicare Other | Admitting: Internal Medicine

## 2022-11-22 ENCOUNTER — Encounter: Payer: Self-pay | Admitting: Internal Medicine

## 2022-11-22 VITALS — BP 130/74 | HR 68 | Temp 97.5°F | Resp 16 | Ht 74.0 in | Wt 146.2 lb

## 2022-11-22 DIAGNOSIS — Z0000 Encounter for general adult medical examination without abnormal findings: Secondary | ICD-10-CM

## 2022-11-22 DIAGNOSIS — M542 Cervicalgia: Secondary | ICD-10-CM | POA: Diagnosis not present

## 2022-11-22 DIAGNOSIS — E782 Mixed hyperlipidemia: Secondary | ICD-10-CM | POA: Diagnosis not present

## 2022-11-22 LAB — BASIC METABOLIC PANEL
BUN: 15 mg/dL (ref 6–23)
CO2: 31 mEq/L (ref 19–32)
Calcium: 10.2 mg/dL (ref 8.4–10.5)
Chloride: 101 mEq/L (ref 96–112)
Creatinine, Ser: 0.76 mg/dL (ref 0.40–1.50)
GFR: 79.33 mL/min (ref >60.00)
Glucose, Bld: 99 mg/dL (ref 70–99)
Potassium: 5.3 mEq/L — ABNORMAL HIGH (ref 3.5–5.1)
Sodium: 139 mEq/L (ref 135–145)

## 2022-11-22 NOTE — Progress Notes (Signed)
Subjective:    Patient ID: [name], male    DOB: 02-22-1930, 88 y.o.   MRN: [id number]  DOS:  11/22/2022 Type of visit - description: CPX  Here for CPX Since the last office visit is feeling well. Diarrhea has subsided, he feels more energetic. Has developed some neck pain for the last couple of weeks.  No injury or fall.  No paresthesias  Wt Readings from Last 3 Encounters:  11/22/22 146 lb 4 oz (66.3 kg)  09/07/22 146 lb (66.2 kg)  07/25/22 143 lb (64.9 kg)   Review of Systems  Other than above, a 14 point review of systems is negative    Past Medical History:  Diagnosis Date   Anxiety    Atrial fibrillation ([place])    Blood transfusion without reported diagnosis    CAD    a. 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 // Myoview 9/22: EF 57, normal perfusion; low risk   Cataract    Chronic abdominal pain    Chronic nausea    Coronary artery disease    Diverticulosis 2011   Diverticulitis 2004   Dyspepsia    Esophageal reflux    GERD (gastroesophageal reflux disease)    Headache    saw neuro 4-16, MRI done   History of echocardiogram    Echo 9/22: EF 60-65, no RWMA, normal diastolic function, GLS -34.7, normal RVSF, normal PASP, mild-moderate BAE, trivial MR, moderate MAC, moderate AV calcification, mild AI, mild aortic stenosis (mean gradient 5 mmHg, V-max 158 cm/s, DI 0.62), mild dilation of ascending aorta (40 mm)   HYPERPLASIA, PROSTATE NOS W/URINARY OBST/LUTS    IBS    INGUINAL HERNIA    [place] planus [id number]   [name]   LUMBAR RADICULOPATHY, RIGHT    Mixed hyperlipidemia    OSTEOPOROSIS    PONV (postoperative nausea and vomiting)    PREMATURE VENTRICULAR CONTRACTIONS    a. After CABG - was on Coumadin/Multaq for a period of time. Coumadin discontinued 04/2010 after maintaining NSR.   UNSPECIFIED ANEMIA     Past Surgical History:  Procedure Laterality Date   BIOPSY   09/10/2018   Procedure: BIOPSY;  Surgeon: Jerene Bears, MD;  Location: [facility];  Service: Gastroenterology;;   CATARACT EXTRACTION Right 10/06/2013   CORONARY ARTERY BYPASS GRAFT     2 VD;anomalous RCA;post op compliacted by AF   ESOPHAGOGASTRODUODENOSCOPY (EGD) WITH PROPOFOL N/A 09/10/2018   Procedure: ESOPHAGOGASTRODUODENOSCOPY (EGD) WITH PROPOFOL;  Surgeon: Jerene Bears, MD;  Location: [facility];  Service: Gastroenterology;  Laterality: N/A;   HIP FRACTURE SURGERY Right 03/2007   Dr. Salvadore Farber   INGUINAL HERNIA REPAIR Left 2012   Dr Brantley Stage   KNEE SURGERY Right 1977   repair   [place]  09/19/2013   [facility]   Social History   Socioeconomic History   Marital status: Widowed    Spouse name: Not on file   Number of children: 0   Years of education: Not on file   Highest education level: Not on file  Occupational History   Occupation: retired    Fish farm manager: RETIRED  Tobacco Use   Smoking status: Former    Types: Cigarettes    Quit date: 11/27/1962    Years since quitting: 60.0   Smokeless tobacco: Never  Vaping Use   Vaping Use: Never used  Substance and Sexual Activity   Alcohol use:  Not Currently    Alcohol/week: 0.0 standard drinks of alcohol    Comment: quit drinking    Drug use: No   Sexual activity: Not Currently  Other Topics Concern   Not on file  Social History Narrative   Lives at [facility] in independent living.     Lost wife 06-2016.     Has no children.  Family: nephews-nices in [place]   Retired from [employer].     Still drives.    Social Determinants of Health   Financial Resource Strain: Low Risk  (10/31/2021)   Overall Financial Resource Strain (CARDIA)    Difficulty of Paying Living Expenses: Not very hard  Food Insecurity: No Food Insecurity (07/19/2022)   Hunger Vital Sign    Worried About Running Out of Food in the Last Year: Never true    Ran Out of Food in the Last Year: Never true  Transportation Needs: No  Transportation Needs (07/19/2022)   PRAPARE - Hydrologist (Medical): No    Lack of Transportation (Non-Medical): No  Physical Activity: Sufficiently Active (05/04/2021)   Exercise Vital Sign    Days of Exercise per Week: 7 days    Minutes of Exercise per Session: 40 min  Stress: No Stress Concern Present (07/20/2021)   [place]    Feeling of Stress : Not at all  Social Connections: Moderately Isolated (07/20/2021)   Social Connection and Isolation Panel [NHANES]    Frequency of Communication with Friends and Family: More than three times a week    Frequency of Social Gatherings with Friends and Family: More than three times a week    Attends Religious Services: More than 4 times per year    Active Member of [employer] or Organizations: No    Attends Archivist Meetings: Never    Marital Status: Widowed  Intimate Partner Violence: Not At Risk (07/20/2021)   Humiliation, Afraid, Rape, and Kick questionnaire    Fear of Current or Ex-Partner: No    Emotionally Abused: No    Physically Abused: No    Sexually Abused: No    Current Outpatient Medications  Medication Instructions   acetaminophen (TYLENOL) 325-650 mg, Oral, Every 8 hours PRN   aspirin EC 81 mg, Oral, Daily   calcium carbonate (TUMS - DOSED IN MG ELEMENTAL CALCIUM) 500 MG chewable tablet 1 tablet, Oral, Daily   denosumab (PROLIA) 60 mg, Subcutaneous, Every 6 months   ELIQUIS 5 MG TABS tablet TAKE ONE (1) TABLET BY MOUTH TWO (2) TIMES DAILY   famotidine-calcium carbonate-magnesium hydroxide (PEPCID COMPLETE) 10-800-165 MG chewable tablet 1 tablet, Oral, Daily PRN   folic acid (FOLVITE) 1 MG tablet TAKE ONE (1) TABLET BY MOUTH EACH DAY   loratadine (CLARITIN) 10 mg, Oral, Daily PRN   Multiple Vitamin (MULITIVITAMIN WITH MINERALS) TABS 1 tablet, Oral, Daily,     nitroGLYCERIN (NITROSTAT) 0.4 MG SL tablet DISSOLVE 1 TABLET UNDER  TONGUE EVERY 5 MINUTES FOR UP TO 3 DOSES AS NEEDED FOR CHEST PAIN   ondansetron (ZOFRAN-ODT) 4 MG disintegrating tablet Take 1 tablet (4 mg total) by mouth and melt on the tongue every 4 (four) hours as needed for nausea/vomiting.   rosuvastatin (CRESTOR) 10 MG tablet TAKE 1 TABLET BY MOUTH DAILY   Vitamin D-3 1,000 Units, Oral, Daily with breakfast       Objective:   Physical Exam BP 130/74   Pulse 68   Temp [name])  97.5 F (36.4 C) (Oral)   Resp 16   Ht _0  (1.88 m)   Wt 146 lb 4 oz (66.3 kg)   BMI 18.78 kg/m  General: Well developed, NAD, BMI noted Neck: No  thyromegaly.  No TTP of the cervical spine, range of motion is satisfactory. HEENT:  Normocephalic . Face symmetric, atraumatic Lungs:  CTA B Normal respiratory effort, no intercostal retractions, no accessory muscle use. Heart: Irregularly irregular Abdomen:  Not distended, soft, non-tender. No rebound or rigidity.  Nontender palpable aorta at the upper abdomen Lower extremities: no pretibial edema bilaterally  Skin: Exposed areas without rash. Not pale. Not jaundice Neurologic:  alert & oriented X3.  Speech normal, gait appropriate for age and unassisted Strength symmetric and appropriate for age.  Psych: Cognition and judgment appear intact.  Cooperative with normal attention span and concentration.  Behavior appropriate. No anxious or depressed appearing.     Assessment      Assessment Prediabetes, A1c 6.0 (2015) Hyperlipidemia Anxiety, depression:  -intol to zoloft, Rx lexapro 5 mg 07-2015: intolerant d/t nausea  -lost wife July 2017 CV: ---CAD--CABG 2011 ----P- Atrial fibrillation, PVCs.  Elliquis ; Multaq d/c d/t GI s/e ----Palpable Ao x years, [place] (-) for AAA 2005 COPD (per CXR 06-2015), PFTs 10-04-15 mild obstruction DJD  Gait-- uses a walker prn GI: --GERD, IBS, diverticulosis --Chronic nausea: 2019.  Work-up: 08-2018: Negative EGD, CT  and US abdomen. 12/2018 wnl Gastric empty stomach.  Symptoms  decreased after Multaq stopped GU: elevated PSA, BPH , (-) bx 2014 Osteoporosis: -hip FX 2008 - dexa ~2005 --> rx fosamax, took x 1 year, dexa ~2010 was rec no fosamax at that time. Dexa 09-2015: T score -2.4 --> rx fosamax, d/c d/t jaw pain 06-2017; took prolia #25 February 2018; T score (-) 31 December 1939, per Endo Lichen planus: Well-controlled per last FLP C Headache -- saw neurology 02-2015, had a MRI/MRA (-) COPD: PFTs 08-2020: Mild obstruction with some air trapping.  Insignificant response to bronchodilators.   PLAN Here for CPX. Prediabetes: A1c's have been stable and very good. Hyperlipidemia: Well-controlled CAD, atrial fibrillation: Seems to be tolerating Eliquis very well, denies chest pain. Osteoporosis: Per Endo, reports she gets Prolia injections regularly Mild anemia: See LOV,  hemoglobin much improved, no iron deficiency. Weight loss: Weight has stabilized Diarrhea: See LOV, GI panel came back showing no infection.  Symptoms resolved. Neck pain: Also for the last 2 weeks is having some neck pain, exam is benign, recommend Tylenol and observation but he desires to see Dr. Rhona Raider.  Will arrange. RTC 6 months

## 2022-11-22 NOTE — Assessment & Plan Note (Signed)
Here for CPX. Prediabetes: A1c's have been stable and very good. Hyperlipidemia: Well-controlled CAD, atrial fibrillation: Seems to be tolerating Eliquis very well, denies chest pain. Osteoporosis: Per Endo, reports she gets Prolia injections regularly Mild anemia: See LOV,  hemoglobin much improved, no iron deficiency. Weight loss: Weight has stabilized Diarrhea: See LOV, GI panel came back showing no infection.  Symptoms resolved. Neck pain: Also for the last 2 weeks is having some neck pain, exam is benign, recommend Tylenol and observation but he desires to see Dr. Rhona Raider.  Will arrange. RTC 6 months

## 2022-11-22 NOTE — Assessment & Plan Note (Signed)
Tdap 2023 -PNA: 2016, 2022; prevnar 2016 -RSV discussed - COVID booster if not done recently recommended. - s/p shingrix x2 - had a flu shot -CCS, prostate ca screening: no further screenings   -Prostate cancer screening: No further screenings, no symptoms, last PSA within normal. - diet -exercise discussed - has a health care power of attorney, recommend to provide a copy - Labs reviewed, check a BMP

## 2022-11-22 NOTE — Patient Instructions (Addendum)
Vaccines I recommend:  RSV vaccine  Check the  blood pressure regularly BP GOAL is between 110/65 and  135/85. If it is consistently higher or lower, let me know     GO TO THE LAB : Get the blood work     [place], [place] back for   checkup in 6 months

## 2022-12-14 NOTE — Telephone Encounter (Signed)
Prolia VOB initiated via [email or web address]  Last Prolia inj 07/21/22  Next Prolia inj due 01/22/23

## 2022-12-18 DIAGNOSIS — M542 Cervicalgia: Secondary | ICD-10-CM | POA: Diagnosis not present

## 2022-12-19 ENCOUNTER — Ambulatory Visit: Payer: Medicare Other | Admitting: Internal Medicine

## 2022-12-19 ENCOUNTER — Encounter: Payer: Self-pay | Admitting: Internal Medicine

## 2022-12-19 VITALS — BP 110/68 | HR 94 | Ht 74.0 in | Wt 150.8 lb

## 2022-12-19 DIAGNOSIS — M810 Age-related osteoporosis without current pathological fracture: Secondary | ICD-10-CM

## 2022-12-19 LAB — VITAMIN D 25 HYDROXY (VIT D DEFICIENCY, FRACTURES): VITD: 36.87 ng/mL (ref 30.00–100.00)

## 2022-12-19 NOTE — Progress Notes (Signed)
Patient ID: [name], male   DOB: 01/17/1930, 89 y.o.   MRN: [id number]   HPI  [name] is a 89 y.o.-year-old [place]an male, initially referred by his PCP, Dr. Larose Kells, returning for follow-up osteoporosis.  Last visit 1 year ago.  Interim history: No falls or fractures since last visit. No vertigo, disequilibrium, orthostasis.  He has a little bit of unsteadiness on his feet, which is not new. He has stiffness of his back. He saw an orthopedic dr. Wilburn Mylar for cervical osteoarthritis - significant mostly when sleeping. He was started on a Prednisone taper and an aniinflammatory cream. He will also start PT. He had Covid 19 in 05/2022.  He recovered well.  Reviewed history: He was diagnosed with osteoporosis in 2008  I first saw the patient in 2020 and at that time I suggested either Tymlos/Forteo or Romosozumab Field seismologist).   He decided against these medications >>  restarted Prolia. He felt better after starting this, with less joint pain and more energy.  Reviewed previous DXA scan reports: 01/31/2021 ([place]) Lumbar spine L1-L4 (L3) Femoral neck (FN)  T-score -0.9 LFN: -2.0  Change in BMD from previous DXA test (%) + 2.5% +11.1%  (*) statistically significant  01/22/2019 ([place]) Lumbar spine L1-L4 Femoral neck (FN)  T-score -2.2 LFN: -3.0  Change in BMD from previous DXA test (%) +1.4% -8.0%*  (*) statistically significant  Date L1-L4 T score FN T score  10/14/2015 ([place]) -2.3 LFN: -2.6  10/04/2007 -2.5 RFN: -2.8 LFN: -2.3   Reviewed previous fractures: - vertebral compression fractures per review of the abd. CT report from 08/16/2018: moderate T8, mild T9, T10, moderate T12 compression fractures. He feels they developed after lifting heavy objects. - Right hip fracture 03/2008: "There is a spiral fracture of the proximal aspect of the right femur.  This begins in the intertrochanteric region and extends into the proximal femur.  There is a avulsion of the  lesser trochanter.  The femoral head is located within the acetabulum. Degenerative changes are noted in the lower spine.  Bones appear Osteopenic".  Osteopenia was detected on x ray of sacrum/coccyx 01/14/2016.  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  No history of vitamin D deficiency: Lab Results  Component Value Date   VD25OH 42.45 12/14/2021   VD25OH 36.3 12/09/2020   VD25OH 46.37 12/09/2019   VD25OH 50.45 12/09/2018   VD25OH 48 12/18/2011   VD25OH 52 12/14/2009   VD25OH 35 06/09/2009   He is on: - calcium (Tums, 750 mg) 1x a day - vit D 1000 units daily - Multivitamin  He does weightbearing exercises- lives at [facility] (he loves it there).  After last visit I gave him a prescription for physical therapy (03/2020) at [facility] rehab. He had PT after Covid19 >> now exercises by himself. He uses the stepper, strength machines; also balance and mobility class.  He does not take high vitamin A doses.  He has a family history of osteoporosis in one of his sisters and possibly also in his mother.  No consistent history of hyper or hypocalcemia or hyperparathyroidism.  No history of kidney stones. Lab Results  Component Value Date   CALCIUM 10.2 11/22/2022   CALCIUM 9.2 07/25/2022  CALCIUM 8.4 (L) 06/22/2022   CALCIUM 9.6 05/23/2022   CALCIUM 9.9 05/03/2022   CALCIUM 10.1 12/19/2021   CALCIUM 9.5 10/19/2021   CALCIUM 9.5 07/23/2021   CALCIUM 9.6 03/28/2021   CALCIUM 9.7 05/27/2020   No history of thyrotoxicosis: Lab Results  Component Value Date   TSH 2.66 05/03/2022   TSH 3.31 10/19/2021   TSH 2.44 08/14/2018   TSH 2.61 02/19/2018   TSH 2.36  08/08/2016   No CKD. Last BUN/Cr: Lab Results  Component Value Date   BUN 15 11/22/2022   CREATININE 0.76 11/22/2022   H/o CABG 2011.  He also has a history of A. fib. He had palpitations in 07/2021 - PVCs.He had extensive cardiac investigation >> negative.  ROS: + Joint aches  I reviewed pt's medications, allergies, PMH, social hx, family hx, and changes were documented in the history of present illness. Otherwise, unchanged from my initial visit note.  Past Medical History:  Diagnosis Date   Anxiety    Atrial fibrillation ([place])    Blood transfusion without reported diagnosis    CAD    a. 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 // Myoview 9/22: EF 57, normal perfusion; low risk   Cataract    Chronic abdominal pain    Chronic nausea    Coronary artery disease    Diverticulosis 2011   Diverticulitis 2004   Dyspepsia    Esophageal reflux    GERD (gastroesophageal reflux disease)    Headache    saw neuro 4-16, MRI done   History of echocardiogram    Echo 9/22: EF 60-65, no RWMA, normal diastolic function, GLS -37.1, normal RVSF, normal PASP, mild-moderate BAE, trivial MR, moderate MAC, moderate AV calcification, mild AI, mild aortic stenosis (mean gradient 5 mmHg, V-max 158 cm/s, DI 0.62), mild dilation of ascending aorta (40 mm)   HYPERPLASIA, PROSTATE NOS W/URINARY OBST/LUTS    IBS    INGUINAL HERNIA    Lichen planus [id number]   [name]   LUMBAR RADICULOPATHY, RIGHT    Mixed hyperlipidemia    OSTEOPOROSIS    PONV (postoperative nausea and vomiting)    PREMATURE VENTRICULAR CONTRACTIONS    a. After CABG - was on Coumadin/Multaq for a period of time. Coumadin discontinued 04/2010 after maintaining NSR.   UNSPECIFIED ANEMIA    Past Surgical History:  Procedure Laterality Date   BIOPSY  09/10/2018   Procedure: BIOPSY;  Surgeon: Jerene Bears, MD;  Location: [facility];  Service:  Gastroenterology;;   CATARACT EXTRACTION Right 10/06/2013   CORONARY ARTERY BYPASS GRAFT     2 VD;anomalous RCA;post op compliacted by AF   ESOPHAGOGASTRODUODENOSCOPY (EGD) WITH PROPOFOL N/A 09/10/2018   Procedure: ESOPHAGOGASTRODUODENOSCOPY (EGD) WITH PROPOFOL;  Surgeon: Jerene Bears, MD;  Location: [facility];  Service: Gastroenterology;  Laterality: N/A;   HIP FRACTURE SURGERY Right 03/2007   Dr. Salvadore Farber   INGUINAL HERNIA REPAIR Left 2012   Dr Brantley Stage   KNEE SURGERY Right 1977   repair   [place]  09/19/2013   [facility]   Social History   Socioeconomic History   Marital status: Widowed    Spouse name: Not on file   Number of children: 0   Years of education: Not on file   Highest education level: Not on file  Occupational History   Occupation: retired    Fish farm manager: RETIRED  Tobacco Use   Smoking status:  Former    Types: Cigarettes    Quit date: 11/27/1962    Years since quitting: 60.1   Smokeless tobacco: Never  Vaping Use   Vaping Use: Never used  Substance and Sexual Activity   Alcohol use: Not Currently    Alcohol/week: 0.0 standard drinks of alcohol    Comment: quit drinking    Drug use: No   Sexual activity: Not Currently  Other Topics Concern   Not on file  Social History Narrative   Lives at [place] in independent living.     Lost wife 06-2016.     Has no children.  Family: nephews-nices in [place]   Retired from [employer].     Still drives.    Social Determinants of Health   Financial Resource Strain: Low Risk  (10/31/2021)   Overall Financial Resource Strain (CARDIA)    Difficulty of Paying Living Expenses: Not very hard  Food Insecurity: No Food Insecurity (07/19/2022)   Hunger Vital Sign    Worried About Running Out of Food in the Last Year: Never true    Ran Out of Food in the Last Year: Never true  Transportation Needs: No Transportation Needs (07/19/2022)   PRAPARE - Hydrologist (Medical): No     Lack of Transportation (Non-Medical): No  Physical Activity: Sufficiently Active (05/04/2021)   Exercise Vital Sign    Days of Exercise per Week: 7 days    Minutes of Exercise per Session: 40 min  Stress: No Stress Concern Present (07/20/2021)   [place]    Feeling of Stress : Not at all  Social Connections: Moderately Isolated (07/20/2021)   Social Connection and Isolation Panel [NHANES]    Frequency of Communication with Friends and Family: More than three times a week    Frequency of Social Gatherings with Friends and Family: More than three times a week    Attends Religious Services: More than 4 times per year    Active Member of [employer] or Organizations: No    Attends Archivist Meetings: Never    Marital Status: Widowed  Intimate Partner Violence: Not At Risk (07/20/2021)   Humiliation, Afraid, Rape, and Kick questionnaire    Fear of Current or Ex-Partner: No    Emotionally Abused: No    Physically Abused: No    Sexually Abused: No   Current Outpatient Medications on File Prior to Visit  Medication Sig Dispense Refill   acetaminophen (TYLENOL) 650 MG CR tablet Take 325-650 mg by mouth every 8 (eight) hours as needed for pain.     aspirin EC 81 MG tablet Take 81 mg by mouth daily.     calcium carbonate (TUMS - DOSED IN MG ELEMENTAL CALCIUM) 500 MG chewable tablet Chew 1 tablet by mouth daily.     Cholecalciferol (VITAMIN D-3) 25 MCG (1000 UT) CAPS Take 1,000 Units by mouth daily with breakfast.     denosumab (PROLIA) 60 MG/ML SOSY injection Inject 60 mg into the skin every 6 (six) months.     ELIQUIS 5 MG TABS tablet TAKE ONE (1) TABLET BY MOUTH TWO (2) TIMES DAILY 60 tablet 5   famotidine-calcium carbonate-magnesium hydroxide (PEPCID COMPLETE) 10-800-165 MG chewable tablet Chew 1 tablet by mouth daily as needed.     folic acid (FOLVITE) 1 MG tablet TAKE ONE (1) TABLET BY MOUTH EACH DAY 90 tablet 2    loratadine (CLARITIN) 10 MG tablet Take 10 mg  by mouth daily as needed for allergies.     Multiple Vitamin (MULITIVITAMIN WITH MINERALS) TABS Take 1 tablet by mouth daily.     nitroGLYCERIN (NITROSTAT) 0.4 MG SL tablet DISSOLVE 1 TABLET UNDER TONGUE EVERY 5 MINUTES FOR UP TO 3 DOSES AS NEEDED FOR CHEST PAIN (Patient not taking: Reported on 09/07/2022) 25 tablet 9   ondansetron (ZOFRAN-ODT) 4 MG disintegrating tablet Take 1 tablet (4 mg total) by mouth and melt on the tongue every 4 (four) hours as needed for nausea/vomiting. (Patient not taking: Reported on 11/22/2022) 20 tablet 0   rosuvastatin (CRESTOR) 10 MG tablet TAKE 1 TABLET BY MOUTH DAILY 90 tablet 2   No current facility-administered medications on file prior to visit.   Allergies  Allergen Reactions   Amoxicillin Diarrhea, Nausea And Vomiting and Other (See Comments)    Patient questions this   Zoloft [Sertraline Hcl]     Patient didn't like the way it made him feel   Remeron [Mirtazapine] Other (See Comments)    Caused excessive drowsiness   Family History  Problem Relation Age of Onset   Stroke Mother    Pancreatic cancer Father        Deceased, 20   Breast cancer Sister        Deceased   Heart disease Sister 43   Stroke Maternal Aunt    Colon cancer Neg Hx    Prostate cancer Neg Hx    Esophageal cancer Neg Hx    Rectal cancer Neg Hx    Stomach cancer Neg Hx    PE: BP 110/68 (BP Location: Right Arm, Patient Position: Sitting, Cuff Size: Normal)   Pulse 94   Ht _0  (1.88 m)   Wt 150 lb 12.8 oz (68.4 kg)   SpO2 97%   BMI 19.36 kg/m  Wt Readings from Last 3 Encounters:  12/19/22 150 lb 12.8 oz (68.4 kg)  11/22/22 146 lb 4 oz (66.3 kg)  09/07/22 146 lb (66.2 kg)   Constitutional: normal weight, in NAD Eyes:  EOMI, no exophthalmos ENT: no neck masses, no cervical lymphadenopathy Cardiovascular: tachycardia, reg. Irreg. rhythm, No MRG Respiratory: CTA B Musculoskeletal: no deformities Skin:no  rashes Neurological: no tremor with outstretched hands  Assessment: 1. Osteoporosis  Plan: 1. Osteoporosis -Likely age-related and he also has a family history of osteoporosis -He has an increased risk for fracture due to previous history of fractures: Lumbar compression fracture developed after lifting buckets of water for his garden, also, typical, osteoporotic, right hip fracture, developed while on Fosamax.  He also has a T-score lower than -2.5. -In the past, after his right hip fracture, he was advised to stop Fosamax.  He did restart afterwards but developed jaw "degeneration" (most likely not ONJ -we looked together at ONJ pictures and he adamantly denied having had a similar picture).  Per his description, his jaw was locking up while on the medication.  We discussed that he this may have been due to osteoarthritis but unlikely to the medication.  He started Prolia afterwards and again developed jaw "degeneration" and stopped.  When I first saw him, we discussed about either restarting Prolia or use an anabolic agent, but after our discussion, he decided to restart Prolia.  He had 5 injections so far, without side effects.  He is aware about possible side effects from Prolia including possible necrosis of the jaw and he is consistent with his visit with his dentist.  He has no jaw problems.  Also, no  thigh/hip pain.  In fact, after starting Prolia, he started to feel better, with less joint pains. -Latest DXA scan from 2022 showed that the bone mineral density was significantly improved at the level of the spine and the right femoral neck.  His L3 vertebra was not analyzed on the latest bone density due to to degenerative changes. -at today's visit, I ordered another DXA scan to be done after 02/01/2023 -At last visit, vitamin D level was normal.  Will recheck the level today -Calcium and kidney function were normal at last check -11/22/2022 -He continues on calcium (Tums) 1 tablet daily and  1000 units vitamin D daily + the amount in the multivitamin -He is aware about fall precautions.  He does have a walker but does not use it.  No falls since last visit.  He continues to stretch and walk and he also started to do more strength and balance exercises since last visit.   -Will continue Prolia for now -we discussed about not leaving more than 6 to 7 months between injections.  The latest injection was delayed due to his COVID-19 infection. -I will see him back in a year  Orders Placed This Encounter  Procedures   DG Bone Density   Vitamin D, 25-hydroxy   - Total time spent for the visit: 25 min, in precharting, obtaining medical information from the chart and from the patient, reviewing his  previous labs, imaging evaluations, and treatments, reviewing his symptoms, counseling him about his osteoporosis (please see the discussed topics above), and developing a plan to further investigate and treat it; he had a number of questions which I addressed.  Component     Latest Ref Rng 12/19/2022  VITD     30.00 - 100.00 ng/mL 36.87   Normal vitamin D level.  Philemon Kingdom, MD PhD [facility]

## 2022-12-19 NOTE — Patient Instructions (Addendum)
Please stop at the lab.  Continue vitamin D 1000 units daily and the multivitamin.  Please call and schedule bone density scan at the [facility]: [phone].   Please come back for a follow-up appointment in 1 year.

## 2022-12-25 NOTE — Telephone Encounter (Signed)
Prior auth renewal required for PROLIA  PA PROCESS DETAILS: Please complete the prior authorization form located at [email or web address] or call [phone]

## 2022-12-27 DIAGNOSIS — M6281 Muscle weakness (generalized): Secondary | ICD-10-CM | POA: Diagnosis not present

## 2022-12-27 DIAGNOSIS — M542 Cervicalgia: Secondary | ICD-10-CM | POA: Diagnosis not present

## 2023-01-01 DIAGNOSIS — M542 Cervicalgia: Secondary | ICD-10-CM | POA: Diagnosis not present

## 2023-01-01 DIAGNOSIS — M6281 Muscle weakness (generalized): Secondary | ICD-10-CM | POA: Diagnosis not present

## 2023-01-02 DIAGNOSIS — M6281 Muscle weakness (generalized): Secondary | ICD-10-CM | POA: Diagnosis not present

## 2023-01-02 DIAGNOSIS — M542 Cervicalgia: Secondary | ICD-10-CM | POA: Diagnosis not present

## 2023-01-04 DIAGNOSIS — M542 Cervicalgia: Secondary | ICD-10-CM | POA: Diagnosis not present

## 2023-01-04 DIAGNOSIS — M6281 Muscle weakness (generalized): Secondary | ICD-10-CM | POA: Diagnosis not present

## 2023-01-04 NOTE — Telephone Encounter (Signed)
Pt ready for scheduling on or after 01/22/23  Out-of-pocket cost due at time of visit: $327  Primary: [place] Medicare Adv HMOPOS Prolia co-insurance: 20% (approximately $302) Admin fee co-insurance: 20% (approximately $25)  Deductible: does not apply  Prior Auth: APPROVED PA# [id number] Valid: 01/04/23-01/05/24  Secondary: n/a Prolia co-insurance:  Admin fee co-insurance:   Deductible: n/a  Prior Auth: n/a PA# Valid:   ** This summary of benefits is an estimation of the patient's out-of-pocket cost. Exact cost may vary based on individual plan coverage.

## 2023-01-04 NOTE — Telephone Encounter (Signed)
PA# [id number] Valid: 01/04/23-01/05/24

## 2023-01-08 DIAGNOSIS — M542 Cervicalgia: Secondary | ICD-10-CM | POA: Diagnosis not present

## 2023-01-08 DIAGNOSIS — M6281 Muscle weakness (generalized): Secondary | ICD-10-CM | POA: Diagnosis not present

## 2023-01-10 DIAGNOSIS — M6281 Muscle weakness (generalized): Secondary | ICD-10-CM | POA: Diagnosis not present

## 2023-01-10 DIAGNOSIS — M542 Cervicalgia: Secondary | ICD-10-CM | POA: Diagnosis not present

## 2023-01-12 DIAGNOSIS — M6281 Muscle weakness (generalized): Secondary | ICD-10-CM | POA: Diagnosis not present

## 2023-01-12 DIAGNOSIS — M542 Cervicalgia: Secondary | ICD-10-CM | POA: Diagnosis not present

## 2023-01-15 DIAGNOSIS — M6281 Muscle weakness (generalized): Secondary | ICD-10-CM | POA: Diagnosis not present

## 2023-01-15 DIAGNOSIS — M542 Cervicalgia: Secondary | ICD-10-CM | POA: Diagnosis not present

## 2023-01-18 DIAGNOSIS — M542 Cervicalgia: Secondary | ICD-10-CM | POA: Diagnosis not present

## 2023-01-18 DIAGNOSIS — M6281 Muscle weakness (generalized): Secondary | ICD-10-CM | POA: Diagnosis not present

## 2023-01-22 DIAGNOSIS — M542 Cervicalgia: Secondary | ICD-10-CM | POA: Diagnosis not present

## 2023-01-22 DIAGNOSIS — M6281 Muscle weakness (generalized): Secondary | ICD-10-CM | POA: Diagnosis not present

## 2023-01-25 DIAGNOSIS — M542 Cervicalgia: Secondary | ICD-10-CM | POA: Diagnosis not present

## 2023-01-25 DIAGNOSIS — M6281 Muscle weakness (generalized): Secondary | ICD-10-CM | POA: Diagnosis not present

## 2023-01-26 ENCOUNTER — Encounter: Payer: Self-pay | Admitting: Internal Medicine

## 2023-01-26 ENCOUNTER — Ambulatory Visit (INDEPENDENT_AMBULATORY_CARE_PROVIDER_SITE_OTHER): Payer: Medicare Other | Admitting: Internal Medicine

## 2023-01-26 VITALS — BP 100/66 | HR 99 | Temp 97.4°F | Resp 12 | Ht 74.0 in | Wt 148.2 lb

## 2023-01-26 DIAGNOSIS — L89152 Pressure ulcer of sacral region, stage 2: Secondary | ICD-10-CM | POA: Diagnosis not present

## 2023-01-26 DIAGNOSIS — I9589 Other hypotension: Secondary | ICD-10-CM | POA: Diagnosis not present

## 2023-01-26 NOTE — Assessment & Plan Note (Signed)
Pressure ulcer, stage I and II: As described above, he is putting too much pressure on the area since he lost weight. Plan:  -Increase nutrition with Ensure proteins.  Addendum pt states Ensure has a lot of potassium. Sheep  skin. Desitin. Change positions frequently. Low blood pressure: BP slightly low today and at home but typically does not feel poorly, recommend to liberalize a little salt intake Nosebleed: Single event, anticoagulated, observation

## 2023-01-26 NOTE — Patient Instructions (Addendum)
You have early pressure ulcers in the sacrum.  Recommend to get sheep skin and use it when you sit on the sofa.    Also consider a donut pillow  Desitin twice daily to protect the area  Ask somebody to check the area weekly.  If is getting worse let me know   Okay to use the saltshaker a little more than before

## 2023-01-26 NOTE — Progress Notes (Signed)
Subjective:    Patient ID: [name], male    DOB: 1930/07/29, 89 y.o.   MRN: [id number]  DOS:  01/26/2023 Type of visit - description: Acute, several concerns  After he had COVID several months ago he lost weight, since then he lost most of he has buttock volume and  has developed a sore between the buttocks. It made him uncomfortable prevent him from going to exercise and doing physical therapy  Today noted a nosebleed from the left side.  No recent URI.  He is ambulatory BPs are rather in the low side, SBP range: 98-128. Today he feels slightly tired but typically feels well.  Wt Readings from Last 3 Encounters:  01/26/23 148 lb 3.2 oz (67.2 kg)  12/19/22 150 lb 12.8 oz (68.4 kg)  11/22/22 146 lb 4 oz (66.3 kg)     Review of Systems See above   Past Medical History:  Diagnosis Date   Anxiety    Atrial fibrillation ([place])    Blood transfusion without reported diagnosis    CAD    a. 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 // Myoview 9/22: EF 57, normal perfusion; low risk   Cataract    Chronic abdominal pain    Chronic nausea    Coronary artery disease    Diverticulosis 2011   Diverticulitis 2004   Dyspepsia    Esophageal reflux    GERD (gastroesophageal reflux disease)    Headache    saw neuro 4-16, MRI done   History of echocardiogram    Echo 9/22: EF 60-65, no RWMA, normal diastolic function, GLS -[id number], normal RVSF, normal PASP, mild-moderate BAE, trivial MR, moderate MAC, moderate AV calcification, mild AI, mild aortic stenosis (mean gradient 5 mmHg, V-max 158 cm/s, DI 0.62), mild dilation of ascending aorta (40 mm)   HYPERPLASIA, PROSTATE NOS W/URINARY OBST/LUTS    IBS    INGUINAL HERNIA    Lichen planus [id number]   [name]   LUMBAR RADICULOPATHY, RIGHT    Mixed hyperlipidemia    OSTEOPOROSIS    PONV (postoperative nausea and vomiting)    PREMATURE VENTRICULAR CONTRACTIONS    a.  After CABG - was on Coumadin/Multaq for a period of time. Coumadin discontinued 04/2010 after maintaining NSR.   UNSPECIFIED ANEMIA     Past Surgical History:  Procedure Laterality Date   BIOPSY  09/10/2018   Procedure: BIOPSY;  Surgeon: Jerene Bears, MD;  [facility];  Service: Gastroenterology;;   CATARACT EXTRACTION Right 10/06/2013   CORONARY ARTERY BYPASS GRAFT     2 VD;anomalous RCA;post op compliacted by AF   ESOPHAGOGASTRODUODENOSCOPY (EGD) WITH PROPOFOL N/A 09/10/2018   Procedure: ESOPHAGOGASTRODUODENOSCOPY (EGD) WITH PROPOFOL;  Surgeon: Jerene Bears, MD;  [facility];  Service: Gastroenterology;  Laterality: N/A;   HIP FRACTURE SURGERY Right 03/2007   Dr. Salvadore Farber   INGUINAL HERNIA REPAIR Left 2012   Dr Brantley Stage   KNEE SURGERY Right 1977   repair   [place]  09/19/2013   [facility]    Current Outpatient Medications  Medication Instructions   acetaminophen (TYLENOL) 325-650 mg, Oral, Every 8 hours PRN   aspirin EC 81 mg, Oral, Daily   calcium carbonate (TUMS - DOSED IN MG ELEMENTAL CALCIUM) 500 MG chewable tablet 1 tablet, Oral, Daily   denosumab (PROLIA) 60 mg, Subcutaneous, Every 6 months   ELIQUIS 5 MG TABS tablet TAKE  ONE (1) TABLET BY MOUTH TWO (2) TIMES DAILY   famotidine-calcium carbonate-magnesium hydroxide (PEPCID COMPLETE) 10-800-165 MG chewable tablet 1 tablet, Oral, Daily PRN   folic acid (FOLVITE) 1 MG tablet TAKE ONE (1) TABLET BY MOUTH EACH DAY   loratadine (CLARITIN) 10 mg, Oral, Daily PRN   Multiple Vitamin (MULITIVITAMIN WITH MINERALS) TABS 1 tablet, Oral, Daily,     nitroGLYCERIN (NITROSTAT) 0.4 MG SL tablet DISSOLVE 1 TABLET UNDER TONGUE EVERY 5 MINUTES FOR UP TO 3 DOSES AS NEEDED FOR CHEST PAIN   ondansetron (ZOFRAN-ODT) 4 MG disintegrating tablet Take 1 tablet (4 mg total) by mouth and melt on the tongue every 4 (four) hours as needed for nausea/vomiting.   rosuvastatin (CRESTOR) 10 MG tablet TAKE 1 TABLET BY MOUTH  DAILY   Vitamin D-3 1,000 Units, Oral, Daily with breakfast       Objective:   Physical Exam BP 100/66 (BP Location: Right Arm, Cuff Size: Normal)   Pulse 99   Temp (!) 97.4 F (36.3 C) (Oral)   Resp 12   Ht '6\' 2"'$  (1.88 m)   Wt 148 lb 3.2 oz (67.2 kg)   SpO2 96%   BMI 19.03 kg/m  General:   Well developed, NAD, BMI noted. HEENT:  Normocephalic . Face symmetric, atraumatic Nostrils: No lesions, no polyps.  Possibly recent bleeding from the medial aspect of the left nostril Lungs:  CTA B Normal respiratory effort, no intercostal retractions, no accessory muscle use. Heart: Irregularly irregular Lower extremities: no pretibial edema bilaterally  Skin: Sacrum  between the buttocks, has an area of erythema around 5 x 6 cm in size.  Within the area there is area for skin denudation around 1 x 1 cm. No discharge, no abscess. Neurologic:  alert & oriented X3.  Speech normal, gait appropriate for age and unassisted Psych--  Cognition and judgment appear intact.  Cooperative with normal attention span and concentration.  Behavior appropriate. No anxious or depressed appearing.      Assessment    Assessment Prediabetes, A1c 6.0 (2015) Hyperlipidemia Anxiety, depression:  -intol to zoloft, Rx lexapro 5 mg 07-2015: intolerant d/t nausea  -lost wife July 2017 CV: ---CAD--CABG 2011 ----P- Atrial fibrillation, PVCs.  Elliquis ; Multaq d/c d/t GI s/e ----Palpable Ao x years, [place] (-) for AAA 2005 COPD (per CXR 06-2015), PFTs 10-04-15 mild obstruction DJD  Gait-- uses a walker prn GI: --GERD, IBS, diverticulosis --Chronic nausea: 2019.  Work-up: 08-2018: Negative EGD, CT  and US abdomen. 12/2018 wnl Gastric empty stomach.  Symptoms decreased after Multaq stopped GU: elevated PSA, BPH , (-) bx 2014 Osteoporosis: -hip FX 2008 - dexa ~2005 --> rx fosamax, took x 1 year, dexa ~2010 was rec no fosamax at that time. Dexa 09-2015: T score -2.4 --> rx fosamax, d/c d/t jaw pain 06-2017;  took prolia #25 February 2018; T score (-) 3 February [id number], per Endo Lichen planus: Well-controlled per last FLP Headache -- saw neurology 02-2015, had a MRI/MRA (-) COPD: PFTs 08-2020: Mild obstruction with some air trapping.  Insignificant response to bronchodilators.   PLAN Pressure ulcer, stage I and II: As described above, he is putting too much pressure on the area since he lost weight. Plan:  -Increase nutrition with Ensure proteins.  Addendum pt states Ensure has a lot of potassium. Sheep  skin. Desitin. Change positions frequently. Low blood pressure: BP slightly low today and at home but typically does not feel poorly, recommend to liberalize a little salt intake Nosebleed: Single event,  anticoagulated, observation

## 2023-01-29 DIAGNOSIS — M6281 Muscle weakness (generalized): Secondary | ICD-10-CM | POA: Diagnosis not present

## 2023-01-29 DIAGNOSIS — M542 Cervicalgia: Secondary | ICD-10-CM | POA: Diagnosis not present

## 2023-02-06 DIAGNOSIS — M6281 Muscle weakness (generalized): Secondary | ICD-10-CM | POA: Diagnosis not present

## 2023-02-06 DIAGNOSIS — M542 Cervicalgia: Secondary | ICD-10-CM | POA: Diagnosis not present

## 2023-02-07 DIAGNOSIS — M6281 Muscle weakness (generalized): Secondary | ICD-10-CM | POA: Diagnosis not present

## 2023-02-07 DIAGNOSIS — M542 Cervicalgia: Secondary | ICD-10-CM | POA: Diagnosis not present

## 2023-02-08 ENCOUNTER — Other Ambulatory Visit: Payer: Self-pay | Admitting: Cardiovascular Disease

## 2023-02-12 ENCOUNTER — Ambulatory Visit (INDEPENDENT_AMBULATORY_CARE_PROVIDER_SITE_OTHER)
Admission: RE | Admit: 2023-02-12 | Discharge: 2023-02-12 | Disposition: A | Discharge status: Home / Self Care | Payer: Medicare Other | Source: Ambulatory Visit | Attending: Internal Medicine | Admitting: Internal Medicine

## 2023-02-12 DIAGNOSIS — M542 Cervicalgia: Secondary | ICD-10-CM | POA: Diagnosis not present

## 2023-02-12 DIAGNOSIS — M810 Age-related osteoporosis without current pathological fracture: Secondary | ICD-10-CM

## 2023-02-12 DIAGNOSIS — M6281 Muscle weakness (generalized): Secondary | ICD-10-CM | POA: Diagnosis not present

## 2023-02-14 DIAGNOSIS — M6281 Muscle weakness (generalized): Secondary | ICD-10-CM | POA: Diagnosis not present

## 2023-02-14 DIAGNOSIS — M542 Cervicalgia: Secondary | ICD-10-CM | POA: Diagnosis not present

## 2023-02-24 NOTE — Telephone Encounter (Signed)
FYI, pt > 30 days past due for PROLIA injection.   If you would like for pt to continue with Prolia therapy, please have clinical staff reach out to pt for scheduling and to explain to importance of receiving Prolia injections every 6 months as abrupt cessation of Prolia raises risk of osteoporotic fracture.    "Discontinuation of Dmab is associated with a 3- to 5-fold higher risk for vertebral, major osteoporotic, and hip fractures [38,39]."   [email or web address]     Per DEXA 02/12/23:  Dear Mr. Browder, The bone density results are back and they show that the T-scores are very slightly lower than before, but this decrease is not statistically significant.  I will suggest to continue with the Prolia for now. Sincerely, Philemon Kingdom MD

## 2023-03-06 ENCOUNTER — Other Ambulatory Visit: Payer: Self-pay | Admitting: Cardiovascular Disease

## 2023-03-06 DIAGNOSIS — I480 Paroxysmal atrial fibrillation: Secondary | ICD-10-CM

## 2023-03-06 NOTE — Telephone Encounter (Signed)
Pt last saw Dr Eden Emms 07/11/22, last labs 11/22/22 Creat 0.76, age 89, weight 67.2kg, based on specified criteria pt is on appropriate dosage of Eliquis 5mg  BID for afib.  Will refill rx.

## 2023-03-26 LAB — GLUCOSE, POCT (MANUAL RESULT ENTRY): Glucose Fasting, POC: 87 mg/dL (ref 70–99)

## 2023-04-11 NOTE — Telephone Encounter (Signed)
Please reach out to pt to assist with schedule visit for  PAST DUE PROLIA injection

## 2023-04-18 NOTE — Telephone Encounter (Signed)
LMx1, MyCx1 to schedule Prolia

## 2023-04-24 ENCOUNTER — Ambulatory Visit: Payer: Medicare Other

## 2023-04-24 VITALS — BP 100/62 | Ht 74.0 in | Wt 150.0 lb

## 2023-04-24 DIAGNOSIS — M810 Age-related osteoporosis without current pathological fracture: Secondary | ICD-10-CM | POA: Diagnosis not present

## 2023-04-24 MED ORDER — DENOSUMAB 60 MG/ML ~~LOC~~ SOSY
60.0000 mg | PREFILLED_SYRINGE | Freq: Once | SUBCUTANEOUS | Status: AC
  Administered 2023-04-24: 60 mg via SUBCUTANEOUS

## 2023-04-24 NOTE — Progress Notes (Signed)
Patient verbally confirmed name, date of birth, and correct medication to be administered. Prolia injection administered and pt tolerated well.  

## 2023-05-15 NOTE — Telephone Encounter (Signed)
Last Prolia inj 04/24/23 Next Prolia inj due 10/26/23 

## 2023-05-22 ENCOUNTER — Ambulatory Visit (INDEPENDENT_AMBULATORY_CARE_PROVIDER_SITE_OTHER): Payer: Medicare Other | Admitting: Internal Medicine

## 2023-05-22 ENCOUNTER — Encounter: Payer: Self-pay | Admitting: Internal Medicine

## 2023-05-22 VITALS — BP 120/68 | HR 91 | Temp 97.5°F | Resp 16 | Ht 74.0 in | Wt 152.5 lb

## 2023-05-22 DIAGNOSIS — R351 Nocturia: Secondary | ICD-10-CM | POA: Diagnosis not present

## 2023-05-22 DIAGNOSIS — I480 Paroxysmal atrial fibrillation: Secondary | ICD-10-CM | POA: Diagnosis not present

## 2023-05-22 DIAGNOSIS — N401 Enlarged prostate with lower urinary tract symptoms: Secondary | ICD-10-CM | POA: Diagnosis not present

## 2023-05-22 DIAGNOSIS — E875 Hyperkalemia: Secondary | ICD-10-CM

## 2023-05-22 DIAGNOSIS — E782 Mixed hyperlipidemia: Secondary | ICD-10-CM | POA: Diagnosis not present

## 2023-05-22 DIAGNOSIS — R3989 Other symptoms and signs involving the genitourinary system: Secondary | ICD-10-CM

## 2023-05-22 DIAGNOSIS — I25810 Atherosclerosis of coronary artery bypass graft(s) without angina pectoris: Secondary | ICD-10-CM | POA: Diagnosis not present

## 2023-05-22 DIAGNOSIS — M542 Cervicalgia: Secondary | ICD-10-CM | POA: Diagnosis not present

## 2023-05-22 DIAGNOSIS — R739 Hyperglycemia, unspecified: Secondary | ICD-10-CM | POA: Diagnosis not present

## 2023-05-22 LAB — CBC WITH DIFFERENTIAL/PLATELET
Basophils Absolute: 0.1 10*3/uL (ref 0.0–0.1)
Basophils Relative: 0.8 % (ref 0.0–3.0)
Eosinophils Absolute: 0.4 10*3/uL (ref 0.0–0.7)
Eosinophils Relative: 4.2 % (ref 0.0–5.0)
HCT: 44.2 % (ref 39.0–52.0)
Hemoglobin: 14.2 g/dL (ref 13.0–17.0)
Lymphocytes Relative: 18.1 % (ref 12.0–46.0)
Lymphs Abs: 1.6 10*3/uL (ref 0.7–4.0)
MCHC: 32.2 g/dL (ref 30.0–36.0)
MCV: 97.4 fl (ref 78.0–100.0)
Monocytes Absolute: 0.8 10*3/uL (ref 0.1–1.0)
Monocytes Relative: 9.5 % (ref 3.0–12.0)
Neutro Abs: 6 10*3/uL (ref 1.4–7.7)
Neutrophils Relative %: 67.4 % (ref 43.0–77.0)
Platelets: 174 10*3/uL (ref 150.0–400.0)
RBC: 4.54 Mil/uL (ref 4.22–5.81)
RDW: 16.1 % — ABNORMAL HIGH (ref 11.5–15.5)
WBC: 8.9 10*3/uL (ref 4.0–10.5)

## 2023-05-22 LAB — LIPID PANEL
Cholesterol: 110 mg/dL (ref 0–200)
HDL: 60.9 mg/dL (ref >39.00)
LDL Cholesterol: 27 mg/dL (ref 0–99)
NonHDL: 48.97
Total CHOL/HDL Ratio: 2
Triglycerides: 112 mg/dL (ref 0.0–149.0)
VLDL: 22.4 mg/dL (ref 0.0–40.0)

## 2023-05-22 LAB — URINALYSIS, ROUTINE W REFLEX MICROSCOPIC
Bilirubin Urine: NEGATIVE
Ketones, ur: NEGATIVE
Nitrite: NEGATIVE
Specific Gravity, Urine: <=1.005 — AB (ref 1.000–1.030)
Total Protein, Urine: NEGATIVE
Urine Glucose: NEGATIVE
Urobilinogen, UA: 0.2 (ref 0.0–1.0)
pH: 6.5 (ref 5.0–8.0)

## 2023-05-22 LAB — PSA: PSA: 5.66 ng/mL — ABNORMAL HIGH (ref 0.10–4.00)

## 2023-05-22 LAB — BASIC METABOLIC PANEL
BUN: 23 mg/dL (ref 6–23)
CO2: 30 mEq/L (ref 19–32)
Calcium: 10.5 mg/dL (ref 8.4–10.5)
Chloride: 99 mEq/L (ref 96–112)
Creatinine, Ser: 0.77 mg/dL (ref 0.40–1.50)
GFR: 78.75 mL/min (ref >60.00)
Glucose, Bld: 89 mg/dL (ref 70–99)
Potassium: 5.6 mEq/L — ABNORMAL HIGH (ref 3.5–5.1)
Sodium: 137 mEq/L (ref 135–145)

## 2023-05-22 LAB — HEMOGLOBIN A1C: Hgb A1c MFr Bld: 6 % (ref 4.6–6.5)

## 2023-05-22 MED ORDER — TAMSULOSIN HCL 0.4 MG PO CAPS: 0.4000 mg | ORAL_CAPSULE | Freq: Every day | ORAL | 1 refills | Status: DC

## 2023-05-22 MED ORDER — PREDNISONE 10 MG PO TABS
ORAL_TABLET | ORAL | 0 refills | Status: DC
Start: 2023-05-22 — End: 2023-07-09

## 2023-05-22 NOTE — Progress Notes (Signed)
Subjective:    Patient ID: [name], male    DOB: 05-Dec-1929, 89 y.o.   MRN: [id number]  DOS:  05/22/2023 Type of visit - description: f/u  Chronic medical problems addressed, has multiple other symptoms as well. Having neck pain for months, saw orthopedics, did physical therapy, pain has returned  4 weeks history of nocturia, wakes up to 3 times at night, some difficulty urinated. Denies fever or chills No dysuria or gross hematuria During the daytime he is typically okay.  Recent potassium was elevated, needs to be recheck.  Complaining of on and off generalized arthralgias without myalgias.  Joint pains are at different places depending on the day.  On Eliquis, good compliance, denies nausea vomiting no blood in the stools.  Has questions about vitamins.  Wt Readings from Last 3 Encounters:  05/22/23 152 lb 8 oz (69.2 kg)  04/24/23 150 lb (68 kg)  01/26/23 148 lb 3.2 oz (67.2 kg)     Review of Systems See above   Past Medical History:  Diagnosis Date   Anxiety    Atrial fibrillation (HCC)    Blood transfusion without reported diagnosis    CAD    a. 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 // Myoview 9/22: EF 57, normal perfusion; low risk   Cataract    Chronic abdominal pain    Chronic nausea    Coronary artery disease    Diverticulosis 2011   Diverticulitis 2004   Dyspepsia    Esophageal reflux    GERD (gastroesophageal reflux disease)    Headache    saw neuro 4-16, MRI done   History of echocardiogram    Echo 9/22: EF 60-65, no RWMA, normal diastolic function, GLS -24.9, normal RVSF, normal PASP, mild-moderate BAE, trivial MR, moderate MAC, moderate AV calcification, mild AI, mild aortic stenosis (mean gradient 5 mmHg, V-max 158 cm/s, DI 0.62), mild dilation of ascending aorta (40 mm)   HYPERPLASIA, PROSTATE NOS W/URINARY OBST/LUTS    IBS    INGUINAL HERNIA    Lichen planus 2006    Francesca Oman MD   LUMBAR RADICULOPATHY, RIGHT    Mixed hyperlipidemia    OSTEOPOROSIS    PONV (postoperative nausea and vomiting)    PREMATURE VENTRICULAR CONTRACTIONS    a. After CABG - was on Coumadin/Multaq for a period of time. Coumadin discontinued 04/2010 after maintaining NSR.   UNSPECIFIED ANEMIA     Past Surgical History:  Procedure Laterality Date   BIOPSY  09/10/2018   Procedure: BIOPSY;  Surgeon: Beverley Fiedler, MD;  Location: [facility];  Service: Gastroenterology;;   CATARACT EXTRACTION Right 10/06/2013   CORONARY ARTERY BYPASS GRAFT     2 VD;anomalous RCA;post op compliacted by AF   ESOPHAGOGASTRODUODENOSCOPY (EGD) WITH PROPOFOL N/A 09/10/2018   Procedure: ESOPHAGOGASTRODUODENOSCOPY (EGD) WITH PROPOFOL;  Surgeon: Beverley Fiedler, MD;  Location: [facility];  Service: Gastroenterology;  Laterality: N/A;   HIP FRACTURE SURGERY Right 03/2007   Dr. Erin Sons   INGUINAL HERNIA REPAIR Left 2012   Dr Luisa Hart   KNEE SURGERY Right 1977   repair   PROSTATE BIOPSY  09/19/2013   [facility]    Current Outpatient Medications  Medication Instructions   acetaminophen (TYLENOL) 325-650 mg, Oral, Every 8 hours PRN   aspirin EC 81 mg, Oral, Daily   calcium carbonate (TUMS - DOSED IN MG ELEMENTAL CALCIUM) 500 MG chewable tablet 1 tablet,  Oral, Daily   denosumab (PROLIA) 60 mg, Subcutaneous, Every 6 months   ELIQUIS 5 MG TABS tablet TAKE ONE (1) TABLET BY MOUTH TWO (2) TIMES DAILY   famotidine-calcium carbonate-magnesium hydroxide (PEPCID COMPLETE) 10-800-165 MG chewable tablet 1 tablet, Oral, Daily PRN   folic acid (FOLVITE) 1 mg, Oral, Daily, Please call [phone] to schedule an appointment for August for future refills. Thank you.   loratadine (CLARITIN) 10 mg, Oral, Daily PRN   Multiple Vitamin (MULITIVITAMIN WITH MINERALS) TABS 1 tablet, Oral, Daily,     nitroGLYCERIN (NITROSTAT) 0.4 MG SL tablet DISSOLVE 1 TABLET UNDER TONGUE EVERY 5 MINUTES FOR UP TO 3 DOSES AS  NEEDED FOR CHEST PAIN   ondansetron (ZOFRAN-ODT) 4 MG disintegrating tablet Take 1 tablet (4 mg total) by mouth and melt on the tongue every 4 (four) hours as needed for nausea/vomiting.   rosuvastatin (CRESTOR) 10 MG tablet TAKE 1 TABLET BY MOUTH DAILY   Vitamin D-3 1,000 Units, Oral, Daily with breakfast       Objective:   Physical Exam BP 120/68   Pulse 91   Temp (!) 97.5 F (36.4 C) (Oral)   Resp 16   Ht 6\' 2"  (1.88 m)   Wt 152 lb 8 oz (69.2 kg)   SpO2 97%   BMI 19.58 kg/m  General:   Well developed, NAD, BMI noted. HEENT:  Normocephalic . Face symmetric, atraumatic Lungs:  CTA B Normal respiratory effort, no intercostal retractions, no accessory muscle use. Heart: Irregularly irregular Upper extremities: Hands and wrist with bony changes consistent with DJD with no synovitis. Skin: Not pale. Not jaundice DRE: Prostate asymmetric, larger and more firm on the right side. Neurologic:  alert & oriented X3.  Speech normal, gait appropriate for age and unassisted Psych--  Cognition and judgment appear intact.  Cooperative with normal attention span and concentration.  Behavior appropriate. No anxious or depressed appearing.      Assessment    Assessment Prediabetes, A1c 6.0 (2015) Hyperlipidemia Anxiety, depression:  -intol to zoloft, Rx lexapro 5 mg 07-2015: intolerant d/t nausea  -lost wife July 2017 CV: ---CAD--CABG 2011 ----P- Atrial fibrillation, PVCs.  Elliquis ; Multaq d/c d/t GI s/e ----Palpable Ao x years, [place] (-) for AAA 2005 COPD (per CXR 06-2015), PFTs 10-04-15 mild obstruction DJD  Gait-- uses a walker prn GI: --GERD, IBS, diverticulosis --Chronic nausea: 2019.  Work-up: 08-2018: Negative EGD, CT  and US abdomen. 12/2018 wnl Gastric empty stomach.  Symptoms decreased after Multaq stopped GU: elevated PSA, BPH , (-) bx 2014 Osteoporosis: -hip FX 2008 - dexa ~2005 --> rx fosamax, took x 1 year, dexa ~2010 was rec no fosamax at that time. Dexa 09-2015:  T score -2.4 --> rx fosamax, d/c d/t jaw pain 06-2017; took prolia #25 February 2018; T score (-) 30 December 2018, per Endo Lichen planus: Well-controlled per last FLP Headache -- saw neurology 02-2015, had a MRI/MRA (-) COPD: PFTs 08-2020: Mild obstruction with some air trapping.  Insignificant response to bronchodilators.   PLAN Hyperglycemia: Check A1c Hyperlipidemia: On Crestor, check FLP. CAD, A-fib: Anticoagulated w/o apparent complications.  Check a CBC.  Check BMP. DJD: Suspect arthralgias as described above related to DJD, no synovitis on exam, no fever or chills, no weight loss.  On Tylenol.  He is very aware of the need to avoid NSAIDs. Neck pain: Saw Ortho, did physical therapy, improved.  Now is doing PT at home but the pain has returned, mostly at the right trapezoid area without radiation  to the arm and without paresthesias.  Plan: Trial with a low-dose of prednisone, continue Tylenol, if not better needs to see Ortho again. BPH, abnormal DRE: C/o nocturia, on DRE R side of the prostate is larger and indurated, on chart review the R side of the prostate has been large before, leading to a  Bx in 2014 which was negative. In 2021, he had some LUTS, PSA was temporarily elevated, Flomax helped?.  Plan:  UA urine culture PSA, we agreed to rechallenge with Flomax.  Further advised for results.  Discussed possible referral to urology. Mole R  forehead: To see his dermatologist soon. RTC 3 m  Multiple concerns and symptoms evaluated today in addition to chronic medical problems.  Extensive chart review.  Time spent 40 minutes

## 2023-05-22 NOTE — Assessment & Plan Note (Signed)
Hyperglycemia: Check A1c Hyperlipidemia: On Crestor, check FLP. CAD, A-fib: Anticoagulated w/o apparent complications.  Check a CBC.  Check BMP. DJD: Suspect arthralgias as described above related to DJD, no synovitis on exam, no fever or chills, no weight loss.  On Tylenol.  He is very aware of the need to avoid NSAIDs. Neck pain: Saw Ortho, did physical therapy, improved.  Now is doing PT at home but the pain has returned, mostly at the right trapezoid area without radiation to the arm and without paresthesias.  Plan: Trial with a low-dose of prednisone, continue Tylenol, if not better needs to see Ortho again. BPH, abnormal DRE: C/o nocturia, on DRE R side of the prostate is larger and indurated, on chart review the R side of the prostate has been large before, leading to a  Bx in 2014 which was negative. In 2021, he had some LUTS, PSA was temporarily elevated, Flomax helped?.  Plan:  UA urine culture PSA, we agreed to rechallenge with Flomax.  Further advised for results.  Discussed possible referral to urology. Mole R  forehead: To see his dermatologist soon. RTC 3 m

## 2023-05-22 NOTE — Patient Instructions (Addendum)
Take prednisone as prescribed for neck pain If not better see your orthopedic doctor  Start Flomax, 1 tablet at nighttime.  Vaccines I recommend: Flu shot this fall RSV vaccine    GO TO THE LAB : Get the blood work     GO TO THE FRONT DESK, PLEASE SCHEDULE YOUR APPOINTMENTS Come back for   a checkup in 3 months

## 2023-05-23 LAB — URINE CULTURE
MICRO NUMBER:: 15124520
Result:: NO GROWTH
SPECIMEN QUALITY:: ADEQUATE

## 2023-05-24 NOTE — Addendum Note (Signed)
Addended byConrad [place] D on: 05/24/2023 10:53 AM   Modules accepted: Orders

## 2023-07-04 ENCOUNTER — Telehealth: Payer: Self-pay | Admitting: Internal Medicine

## 2023-07-04 NOTE — Telephone Encounter (Signed)
Please call patient, overdue for a BMP, DX hypokalemia. Please arrange a lab appointment.

## 2023-07-05 ENCOUNTER — Other Ambulatory Visit: Payer: Self-pay | Admitting: Cardiovascular Disease

## 2023-07-05 NOTE — Telephone Encounter (Signed)
Called pt and left a VM asking pt to call the office back to schedule an appointment.

## 2023-07-06 ENCOUNTER — Other Ambulatory Visit (INDEPENDENT_AMBULATORY_CARE_PROVIDER_SITE_OTHER): Payer: Medicare Other

## 2023-07-06 DIAGNOSIS — E875 Hyperkalemia: Secondary | ICD-10-CM | POA: Diagnosis not present

## 2023-07-06 LAB — BASIC METABOLIC PANEL
BUN: 21 mg/dL (ref 6–23)
CO2: 29 mEq/L (ref 19–32)
Calcium: 9.2 mg/dL (ref 8.4–10.5)
Chloride: 102 mEq/L (ref 96–112)
Creatinine, Ser: 0.78 mg/dL (ref 0.40–1.50)
GFR: 78.37 mL/min (ref >60.00)
Glucose, Bld: 101 mg/dL — ABNORMAL HIGH (ref 70–99)
Potassium: 4.8 mEq/L (ref 3.5–5.1)
Sodium: 139 mEq/L (ref 135–145)

## 2023-07-06 NOTE — Telephone Encounter (Signed)
Pt has lab appointment today 07/06/2023.

## 2023-07-08 NOTE — Progress Notes (Unsigned)
Cardiology Office Note:    Date:  07/09/2023  ID:  [name], DOB August 24, 1930, MRN [id number] PCP: Wanda Plump, MD  [facility] Providers Cardiologist:  Charlton Haws, MD       Patient Profile:      Coronary artery disease  S/p CABG in 2011 Myoview 07/29/21: EF 57, normal perfusion, low risk Paroxysmal atrial fibrillation Dronedarone DC'd secondary to GI side effects; Not on beta-blocker due to bradycardia Anticoagulation: Apixaban Monitor 07/2021: NSR, ATach longest 13 beats TTE 08/30/15: EF 55-60, no RWMA, GR 1 DD, trivial AI, mild RAE  Hyperlipidemia Ascending aorta dilation  CT 05/18/22: ascending aorta ectasia, 3.8 cm  Osteoporosis Elevated PSA GERD           Discussed the use of AI scribe software for clinical note transcription with the patient, who gave verbal consent to proceed.  History of Present Illness   A 89 year old patient with a history of CAD status post CABG, AFib, and hyperlipidemia presents for a follow-up visit. The patient reports experiencing dizziness and fatigue for the past year, which have been progressively worsening. The dizziness is described as a constant fogginess that does not seem to be related to changes in position or blood pressure. The patient also reports episodes of shortness of breath even at rest, which do not seem to be related to the dizziness. The patient denies chest pain, swelling in the legs, recent hospitalizations, or surgeries. The patient had COVID-19 the previous year and reports a significant weight loss during that time due to decreased appetite, but has since regained most of the weight. The patient denies any recent fever, chills, cough, changes in bowel or bladder habits, or other systemic symptoms.     ROS:  See HPI No melena, hematochezia, hematuria.    Studies Reviewed:   EKG Interpretation Date/Time:  Monday July 09 2023 11:04:22 EDT Ventricular Rate:  81 PR Interval:    QRS Duration:  108 QT  Interval:  378 QTC Calculation: 439 R Axis:   60  Text Interpretation: Atrial fibrillation Right bundle branch block No change since ECG dated 07/11/22 Confirmed by Tereso Newcomer [phone]) on 07/09/2023 11:16:39 AM   Risk Assessment/Calculations:    CHA2DS2-VASc Score = 3   This indicates a 3.2% annual risk of stroke. The patient's score is based upon: CHF History: 0 HTN History: 0 Diabetes History: 0 Stroke History: 0 Vascular Disease History: 1 Age Score: 6 Gender Score: 0            Physical Exam:   VS:  BP 121/62   Pulse 80   Ht 6\' 2"  (1.88 m)   Wt 150 lb 3.2 oz (68.1 kg)   SpO2 97%   BMI 19.28 kg/m    Wt Readings from Last 3 Encounters:  07/09/23 150 lb 3.2 oz (68.1 kg)  05/22/23 152 lb 8 oz (69.2 kg)  04/24/23 150 lb (68 kg)    Constitutional:      Appearance: Healthy appearance. Not in distress.  Neck:     Vascular: No JVR. JVD normal.  Pulmonary:     Breath sounds: Normal breath sounds. No wheezing. No rales.  Cardiovascular:     Normal rate. Irregularly irregular rhythm.     Murmurs: There is no murmur.  Edema:    Peripheral edema absent.  Abdominal:     Palpations: Abdomen is soft.  Skin:    General: Skin is warm and dry.      Assessment and Plan:  Paroxysmal A-fib (HCC) Persistent symptoms of dizziness and fatigue. He was in NSR in 2022. EKG in 2023 demonstrated AFib. Symptoms have been ongoing for about a year and getting worse. He notes profound fatigue at times. Question if symptoms related to atrial fibrillation. Currently on Eliquis 5mg  twice daily. -Based upon weight, creatinine, continue Eliquis 5mg  twice daily. -Order 14-day Zio monitor to assess AFib burden. -Order echocardiogram to assess atrial size and LV function. -Consider antiarrhythmic drug therapy and cardioversion if AFib is present 100% of the time and atrial size is fairly normal.  CAD (coronary artery disease) Status post CABG in 2011. Myoview in 2022 was low risk. He is not  having chest pain to suggest of angina.  -Continue rosuvastatin 10mg  daily, nitroglycerin as needed, and aspirin 81mg  daily.  Mixed hyperlipidemia Labs from PCP reviewed. LDL optimal at 27 in June 1308. -Continue rosuvastatin 10mg  daily.  Ascending aorta dilation (HCC) Dilation of 3.8cm noted in June 2023. -Order echocardiogram as part of AFib assessment. -Consider repeat chest CT in 2 years.  Fatigue Dizziness, fatigue, dyspnea on exertion. Question if related to atrial fibrillation as noted. He brings in detailed BP list. He has BPs in the mid to low 100s and high 90s at times. He has no associated symptoms. Proceed with Echo and Monitor as noted. Recent Hgb normal.  -Check TSH -Advised patient to wear compression socks.     Dispo:  Return in about 8 weeks (around 09/03/2023) for Follow up after testing w/ Dr. Eden Emms, or Tereso Newcomer, PA-C.  Signed, Tereso Newcomer, PA-C

## 2023-07-09 ENCOUNTER — Encounter: Payer: Self-pay | Admitting: Physician Assistant

## 2023-07-09 ENCOUNTER — Ambulatory Visit: Payer: Medicare Other | Attending: Physician Assistant | Admitting: Physician Assistant

## 2023-07-09 ENCOUNTER — Ambulatory Visit: Payer: Medicare Other | Attending: Physician Assistant

## 2023-07-09 VITALS — BP 121/62 | HR 80 | Ht 74.0 in | Wt 150.2 lb

## 2023-07-09 DIAGNOSIS — I77810 Thoracic aortic ectasia: Secondary | ICD-10-CM

## 2023-07-09 DIAGNOSIS — R0602 Shortness of breath: Secondary | ICD-10-CM | POA: Diagnosis not present

## 2023-07-09 DIAGNOSIS — R55 Syncope and collapse: Secondary | ICD-10-CM

## 2023-07-09 DIAGNOSIS — E782 Mixed hyperlipidemia: Secondary | ICD-10-CM | POA: Diagnosis not present

## 2023-07-09 DIAGNOSIS — I480 Paroxysmal atrial fibrillation: Secondary | ICD-10-CM

## 2023-07-09 DIAGNOSIS — R5383 Other fatigue: Secondary | ICD-10-CM | POA: Diagnosis not present

## 2023-07-09 DIAGNOSIS — I25810 Atherosclerosis of coronary artery bypass graft(s) without angina pectoris: Secondary | ICD-10-CM | POA: Diagnosis not present

## 2023-07-09 LAB — TSH: TSH: 3.37 u[IU]/mL (ref 0.450–4.500)

## 2023-07-09 NOTE — Progress Notes (Unsigned)
Enrolled for [employer] to mail a ZIO XT long term holter monitor to the patients address on file.   Dr. Nishan to read. 

## 2023-07-09 NOTE — Assessment & Plan Note (Signed)
Labs from PCP reviewed. LDL optimal at 27 in June 8295. -Continue rosuvastatin 10mg  daily.

## 2023-07-09 NOTE — Assessment & Plan Note (Signed)
Persistent symptoms of dizziness and fatigue. He was in NSR in 2022. EKG in 2023 demonstrated AFib. Symptoms have been ongoing for about a year and getting worse. He notes profound fatigue at times. Question if symptoms related to atrial fibrillation. Currently on Eliquis 5mg  twice daily. -Based upon weight, creatinine, continue Eliquis 5mg  twice daily. -Order 14-day Zio monitor to assess AFib burden. -Order echocardiogram to assess atrial size and LV function. -Consider antiarrhythmic drug therapy and cardioversion if AFib is present 100% of the time and atrial size is fairly normal.

## 2023-07-09 NOTE — Assessment & Plan Note (Signed)
Dilation of 3.8cm noted in June 2023. -Order echocardiogram as part of AFib assessment. -Consider repeat chest CT in 2 years.

## 2023-07-09 NOTE — Assessment & Plan Note (Signed)
Status post CABG in 2011. Myoview in 2022 was low risk. He is not having chest pain to suggest of angina.  -Continue rosuvastatin 10mg  daily, nitroglycerin as needed, and aspirin 81mg  daily.

## 2023-07-09 NOTE — Assessment & Plan Note (Signed)
Dizziness, fatigue, dyspnea on exertion. Question if related to atrial fibrillation as noted. He brings in detailed BP list. He has BPs in the mid to low 100s and high 90s at times. He has no associated symptoms. Proceed with Echo and Monitor as noted. Recent Hgb normal.  -Check TSH -Advised patient to wear compression socks.

## 2023-07-09 NOTE — Patient Instructions (Signed)
Medication Instructions:  Your physician recommends that you continue on your current medications as directed. Please refer to the Current Medication list given to you today.  *If you need a refill on your cardiac medications before your next appointment, please call your pharmacy*  Lab Work: TSH-TODAY If you have labs (blood work) drawn today and your tests are completely normal, you will receive your results only by: MyChart Message (if you have MyChart) OR A paper copy in the mail If you have any lab test that is abnormal or we need to change your treatment, we will call you to review the results.  Testing/Procedures: Your physician has requested that you have an echocardiogram. Echocardiography is a painless test that uses sound waves to create images of your heart. It provides your doctor with information about the size and shape of your heart and how well your heart's chambers and valves are working. This procedure takes approximately one hour. There are no restrictions for this procedure. Please do NOT wear cologne, perfume, aftershave, or lotions (deodorant is allowed). Please arrive 15 minutes prior to your appointment time.   ZIO XT- Long Term Monitor Instructions  Your physician has requested you wear a ZIO patch monitor for 14 days.  This is a single patch monitor. [employer] supplies one patch monitor per enrollment. Additional stickers are not available. Please do not apply patch if you will be having a Nuclear Stress Test,  Echocardiogram, Cardiac CT, MRI, or Chest Xray during the period you would be wearing the  monitor. The patch cannot be worn during these tests. You cannot remove and re-apply the  ZIO XT patch monitor.  Your ZIO patch monitor will be mailed 3 day USPS to your address on file. It may take 3-5 days  to receive your monitor after you have been enrolled.  Once you have received your monitor, please review the enclosed instructions. Your monitor  has already  been registered assigning a specific monitor serial # to you.  Billing and Patient Assistance Program Information  We have supplied [employer] with any of your insurance information on file for billing purposes. [employer] offers a sliding scale Patient Assistance Program for patients that do not have  insurance, or whose insurance does not completely cover the cost of the ZIO monitor.  You must apply for the Patient Assistance Program to qualify for this discounted rate.  To apply, please call [employer] at [phone], select option 4, select option 2, ask to apply for  Patient Assistance Program. Meredeth Ide will ask your household income, and how many people  are in your household. They will quote your out-of-pocket cost based on that information.  [employer] will also be able to set up a 57-month, interest-free payment plan if needed.  Applying the monitor   Shave hair from upper left chest.  Hold abrader disc by orange tab. Rub abrader in 40 strokes over the upper left chest as  indicated in your monitor instructions.  Clean area with 4 enclosed alcohol pads. Let dry.  Apply patch as indicated in monitor instructions. Patch will be placed under collarbone on left  side of chest with arrow pointing upward.  Rub patch adhesive wings for 2 minutes. Remove white label marked "1". Remove the white  label marked "2". Rub patch adhesive wings for 2 additional minutes.  While looking in a mirror, press and release button in center of patch. A small green light will  flash 3-4 times. This will be your only indicator that the  monitor has been turned on.  Do not shower for the first 24 hours. You may shower after the first 24 hours.  Press the button if you feel a symptom. You will hear a small click. Record Date, Time and  Symptom in the Patient Logbook.  When you are ready to remove the patch, follow instructions on the last 2 pages of Patient  Logbook. Stick patch monitor onto the last page of Patient  Logbook.  Place Patient Logbook in the blue and white box. Use locking tab on box and tape box closed  securely. The blue and white box has prepaid postage on it. Please place it in the mailbox as  soon as possible. Your physician should have your test results approximately 7 days after the  monitor has been mailed back to [facility].  Call [facility] Customer Care at [phone] if you have questions regarding  your ZIO XT patch monitor. Call them immediately if you see an orange light blinking on your  monitor.  If your monitor falls off in less than 4 days, contact our Monitor department at [phone].  If your monitor becomes loose or falls off after 4 days call [employer] at [phone] for  suggestions on securing your monitor   Follow-Up: At [facility], you and your health needs are our priority.  As part of our continuing mission to provide you with exceptional heart care, we have created designated Provider Care Teams.  These Care Teams include your primary Cardiologist (physician) and Advanced Practice Providers (APPs -  Physician Assistants and Nurse Practitioners) who all work together to provide you with the care you need, when you need it.   Your next appointment:   6-8 week(s)  Provider:   Charlton Haws, MD  or Tereso Newcomer, PA-C  on a day that Dr Eden Emms is in the office

## 2023-07-11 DIAGNOSIS — I480 Paroxysmal atrial fibrillation: Secondary | ICD-10-CM | POA: Diagnosis not present

## 2023-07-11 DIAGNOSIS — R55 Syncope and collapse: Secondary | ICD-10-CM | POA: Diagnosis not present

## 2023-07-12 ENCOUNTER — Encounter (INDEPENDENT_AMBULATORY_CARE_PROVIDER_SITE_OTHER): Payer: Self-pay

## 2023-07-27 ENCOUNTER — Ambulatory Visit (HOSPITAL_COMMUNITY): Payer: Medicare Other | Attending: Physician Assistant

## 2023-07-27 DIAGNOSIS — I480 Paroxysmal atrial fibrillation: Secondary | ICD-10-CM | POA: Diagnosis not present

## 2023-07-27 DIAGNOSIS — R0602 Shortness of breath: Secondary | ICD-10-CM | POA: Insufficient documentation

## 2023-07-27 LAB — ECHOCARDIOGRAM COMPLETE
Area-P 1/2: 3.6 cm2
P 1/2 time: 556 msec
S' Lateral: 2.8 cm

## 2023-07-31 ENCOUNTER — Telehealth: Payer: Self-pay | Admitting: *Deleted

## 2023-07-31 DIAGNOSIS — I350 Nonrheumatic aortic (valve) stenosis: Secondary | ICD-10-CM

## 2023-07-31 NOTE — Telephone Encounter (Signed)
-----   Message from Tereso Newcomer sent at 07/30/2023  9:09 PM EDT ----- Results sent to [name] via MyChart. See MyChart comments below. I will send a copy to Wanda Plump, MD as Lorain Childes.  PLAN:  - Continue current medications/treatment plan and follow up as scheduled.  - Schedule echocardiogram in 1 year (Dx ascending thoracic aorta)  [name]  Your echocardiogram shows normal ejection fraction (heart function), mild leakage of the mitral valve (mitral regurgitation) and aortic valve (aortic insufficiency). There is mild dilation of the large vessel that exits the heart (ascending aorta dilation). We will get a repeat echocardiogram in 1 year. There is moderate enlargement of the upper chambers (atrial enlargement).  This is usually seen if a patient has been in atrial fibrillation for a while. Continue current medications/treatment plan and follow up as scheduled.  Tereso Newcomer, PA-C

## 2023-08-01 ENCOUNTER — Encounter: Payer: Self-pay | Admitting: Internal Medicine

## 2023-08-01 ENCOUNTER — Ambulatory Visit (INDEPENDENT_AMBULATORY_CARE_PROVIDER_SITE_OTHER): Payer: Medicare Other | Admitting: Internal Medicine

## 2023-08-01 ENCOUNTER — Ambulatory Visit (INDEPENDENT_AMBULATORY_CARE_PROVIDER_SITE_OTHER): Payer: Medicare Other | Admitting: *Deleted

## 2023-08-01 VITALS — BP 113/56 | HR 78 | Ht 74.0 in | Wt 154.2 lb

## 2023-08-01 VITALS — BP 113/56 | HR 78 | Ht 74.0 in | Wt 154.0 lb

## 2023-08-01 DIAGNOSIS — M159 Polyosteoarthritis, unspecified: Secondary | ICD-10-CM

## 2023-08-01 DIAGNOSIS — I25810 Atherosclerosis of coronary artery bypass graft(s) without angina pectoris: Secondary | ICD-10-CM

## 2023-08-01 DIAGNOSIS — J42 Unspecified chronic bronchitis: Secondary | ICD-10-CM

## 2023-08-01 DIAGNOSIS — I480 Paroxysmal atrial fibrillation: Secondary | ICD-10-CM

## 2023-08-01 DIAGNOSIS — Z0000 Encounter for general adult medical examination without abnormal findings: Secondary | ICD-10-CM | POA: Diagnosis not present

## 2023-08-01 NOTE — Assessment & Plan Note (Signed)
DJD: Complain of pain in both thumbs, exam consistent with DJD.  Recommend observation.  To let me know if he ever develops swelling or redness at any joint.   CAD, paroxysmal A-fib 07/09/2023: Saw cardiology, he had persistent dizziness and fatigue.  No angina symptoms.  They ordered an echo Zio patch; Echo was okay and they are planning the next echo in 1-year.  Zio patch pending. Today reports occasional lack of energy.  On chart review he does not have anemia, vitamin levels have been checked over the last few years and essentially normal.  He wonders about emphysema but he has minimal symptoms and O2 sat is 99% at rest. For now recommend observation.  Overall I think she is doing okay and the patient agreed with me Fatigue: See above. Hyperkalemia: At LOV, potassium elevated, follow-up BMP normal. BPH, abnormal DRE: Since LOV, PSA was elevated but stable.  Has some leukocyturia with NEG UCX, started empiric Flomax without much help.  Plan: Stop Flomax, observation. Vaccine advice provided. RTC 4 months.

## 2023-08-01 NOTE — Progress Notes (Signed)
Subjective:   [name] is a 89 y.o. male who presents for Medicare Annual/Subsequent preventive examination.  Visit Complete: In person  Patient Medicare AWV questionnaire was completed by the patient on 07/25/23; I have confirmed that all information answered by patient is correct and no changes since this date.  Review of Systems     Cardiac Risk Factors include: advanced age (>87men, >61 women);dyslipidemia;hypertension;male gender     Objective:    Today's Vitals   08/01/23 1039  BP: (!) 113/56  Pulse: 78  Weight: 154 lb 3.2 oz (69.9 kg)  Height: 6\' 2"  (1.88 m)   Body mass index is 19.8 kg/m.     08/01/2023   10:46 AM 07/25/2022    1:17 PM 06/20/2022    6:11 AM 07/23/2021    2:59 PM 07/20/2021    3:07 PM 05/09/2020    7:05 PM 03/21/2020    6:12 PM  Advanced Directives  Does Patient Have a Medical Advance Directive? Yes Yes Yes No Yes No No  Type of Estate agent of [place];Living will Healthcare Power of [place];Living will Living will  Living will    Does patient want to make changes to medical advance directive? No - Patient declined No - Patient declined       Copy of Healthcare Power of Attorney in Chart? No - copy requested No - copy requested       Would patient like information on creating a medical advance directive?    No - Patient declined  Yes (ED - Information included in AVS)     Current Medications (verified) Outpatient Encounter Medications as of 08/01/2023  Medication Sig   acetaminophen (TYLENOL) 650 MG CR tablet Take 325-650 mg by mouth every 8 (eight) hours as needed for pain.   aspirin EC 81 MG tablet Take 81 mg by mouth daily.   calcium carbonate (TUMS - DOSED IN MG ELEMENTAL CALCIUM) 500 MG chewable tablet Chew 1 tablet by mouth daily.   Cholecalciferol (VITAMIN D-3) 25 MCG (1000 UT) CAPS Take 1,000 Units by mouth daily with breakfast.   denosumab (PROLIA) 60 MG/ML SOSY injection Inject 60 mg into the skin every 6 (six)  months.   ELIQUIS 5 MG TABS tablet TAKE ONE (1) TABLET BY MOUTH TWO (2) TIMES DAILY   famotidine-calcium carbonate-magnesium hydroxide (PEPCID COMPLETE) 10-800-165 MG chewable tablet Chew 1 tablet by mouth daily as needed.   folic acid (FOLVITE) 1 MG tablet Take 1 tablet (1 mg total) by mouth daily. Please call [phone] to schedule an appointment for August for future refills. Thank you.   loratadine (CLARITIN) 10 MG tablet Take 10 mg by mouth daily as needed for allergies.   Multiple Vitamin (MULITIVITAMIN WITH MINERALS) TABS Take 1 tablet by mouth daily.   nitroGLYCERIN (NITROSTAT) 0.4 MG SL tablet DISSOLVE 1 TABLET UNDER TONGUE EVERY 5 MINUTES FOR UP TO 3 DOSES AS NEEDED FOR CHEST PAIN   rosuvastatin (CRESTOR) 10 MG tablet Take 1 tablet (10 mg total) by mouth daily.   tamsulosin (FLOMAX) 0.4 MG CAPS capsule Take 1 capsule (0.4 mg total) by mouth daily after supper.   [DISCONTINUED] ondansetron (ZOFRAN-ODT) 4 MG disintegrating tablet Take 1 tablet (4 mg total) by mouth and melt on the tongue every 4 (four) hours as needed for nausea/vomiting.   No facility-administered encounter medications on file as of 08/01/2023.    Allergies (verified) Amoxicillin, Zoloft [sertraline hcl], and Remeron [mirtazapine]   History: Past Medical History:  Diagnosis Date  Anxiety    Atrial fibrillation (HCC)    Blood transfusion without reported diagnosis    CAD    a. 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 // Myoview 9/22: EF 57, normal perfusion; low risk   Cataract    Chronic abdominal pain    Chronic nausea    Coronary artery disease    Diverticulosis 2011   Diverticulitis 2004   Dyspepsia    Esophageal reflux    GERD (gastroesophageal reflux disease)    Headache    saw neuro 4-16, MRI done   History of echocardiogram    Echo 9/22: EF 60-65, no RWMA, normal diastolic function, GLS -24.9, normal RVSF, normal PASP,  mild-moderate BAE, trivial MR, moderate MAC, moderate AV calcification, mild AI, mild aortic stenosis (mean gradient 5 mmHg, V-max 158 cm/s, DI 0.62), mild dilation of ascending aorta (40 mm)   HYPERPLASIA, PROSTATE NOS W/URINARY OBST/LUTS    IBS    INGUINAL HERNIA    Lichen planus 2006   Francesca Oman MD   LUMBAR RADICULOPATHY, RIGHT    Mixed hyperlipidemia    OSTEOPOROSIS    PONV (postoperative nausea and vomiting)    PREMATURE VENTRICULAR CONTRACTIONS    a. After CABG - was on Coumadin/Multaq for a period of time. Coumadin discontinued 04/2010 after maintaining NSR.   UNSPECIFIED ANEMIA    Past Surgical History:  Procedure Laterality Date   BIOPSY  09/10/2018   Procedure: BIOPSY;  Surgeon: Beverley Fiedler, MD;  Location: [facility];  Service: Gastroenterology;;   CATARACT EXTRACTION Right 10/06/2013   CORONARY ARTERY BYPASS GRAFT     2 VD;anomalous RCA;post op compliacted by AF   ESOPHAGOGASTRODUODENOSCOPY (EGD) WITH PROPOFOL N/A 09/10/2018   Procedure: ESOPHAGOGASTRODUODENOSCOPY (EGD) WITH PROPOFOL;  Surgeon: Beverley Fiedler, MD;  Location: [facility];  Service: Gastroenterology;  Laterality: N/A;   HIP FRACTURE SURGERY Right 03/2007   Dr. Erin Sons   INGUINAL HERNIA REPAIR Left 2012   Dr Luisa Hart   KNEE SURGERY Right 1977   repair   PROSTATE BIOPSY  09/19/2013   [facility]   Family History  Problem Relation Age of Onset   Stroke Mother    Pancreatic cancer Father        Deceased, 69   Breast cancer Sister        Deceased   Heart disease Sister 31   Stroke Maternal Aunt    Colon cancer Neg Hx    Prostate cancer Neg Hx    Esophageal cancer Neg Hx    Rectal cancer Neg Hx    Stomach cancer Neg Hx    Social History   Socioeconomic History   Marital status: Widowed    Spouse name: Not on file   Number of children: 0   Years of education: Not on file   Highest education level: Not on file  Occupational History   Occupation: retired    Associate Professor: RETIRED   Tobacco Use   Smoking status: Former    Current packs/day: 0.00    Types: Cigarettes    Quit date: 11/27/1962    Years since quitting: 60.7   Smokeless tobacco: Never  Vaping Use   Vaping status: Never Used  Substance and Sexual Activity   Alcohol use: Not Currently    Alcohol/week: 0.0 standard drinks of alcohol    Comment: quit drinking    Drug use: No   Sexual activity: Not Currently  Other Topics Concern   Not on file  Social History Narrative   Lives at [facility] in independent living.     Lost wife 06-2016.     Has no children.  Family: nephews-nices in [place]   Retired from [employer].     Still drives.    Social Determinants of Health   Financial Resource Strain: Low Risk  (07/25/2023)   Overall Financial Resource Strain (CARDIA)    Difficulty of Paying Living Expenses: Not hard at all  Food Insecurity: No Food Insecurity (07/25/2023)   Hunger Vital Sign    Worried About Running Out of Food in the Last Year: Never true    Ran Out of Food in the Last Year: Never true  Transportation Needs: No Transportation Needs (07/25/2023)   PRAPARE - Administrator, Civil Service (Medical): No    Lack of Transportation (Non-Medical): No  Physical Activity: Sufficiently Active (07/25/2023)   Exercise Vital Sign    Days of Exercise per Week: 7 days    Minutes of Exercise per Session: 60 min  Stress: No Stress Concern Present (07/25/2023)   [employer] of Occupational Health - Occupational Stress Questionnaire    Feeling of Stress : Not at all  Social Connections: Unknown (07/25/2023)   Social Connection and Isolation Panel [NHANES]    Frequency of Communication with Friends and Family: Once a week    Frequency of Social Gatherings with Friends and Family: Twice a week    Attends Religious Services: Not on Marketing executive or Organizations: Yes    Attends Banker Meetings: More than 4 times per year    Marital Status: Widowed     Tobacco Counseling Counseling given: Not Answered   Clinical Intake:  Pre-visit preparation completed: Yes  Pain : No/denies pain  BMI - recorded: 19.8 Nutritional Status: BMI of 19-24  Normal Nutritional Risks: None Diabetes: No  How often do you need to have someone help you when you read instructions, pamphlets, or other written materials from your doctor or pharmacy?: 2 - Rarely  Interpreter Needed?: No  Information entered by :: Donne Anon, CMA   Activities of Daily Living    07/25/2023   10:03 AM  In your present state of health, do you have any difficulty performing the following activities:  Hearing? 1  Vision? 0  Difficulty concentrating or making decisions? 0  Walking or climbing stairs? 0  Dressing or bathing? 0  Doing errands, shopping? 0  Preparing Food and eating ? N  Using the Toilet? N  In the past six months, have you accidently leaked urine? N  Do you have problems with loss of bowel control? N  Managing your Medications? N  Managing your Finances? N  Housekeeping or managing your Housekeeping? N    Patient Care Team: Wanda Plump, MD as PCP - General (Internal Medicine) Wendall Stade, MD as PCP - Cardiology (Cardiology) Alfredo Martinez, MD as Consulting Physician (Urology) Wendall Stade, MD as Consulting Physician (Cardiology) Bufford Buttner, MD as Consulting Physician (Dermatology) Waymon Budge, MD as Consulting Physician (Pulmonary Disease) Glendale Chard, DO as Consulting Physician (Neurology) Louis Meckel, MD (Inactive) as Consulting Physician (Gastroenterology) Ernesto Rutherford, MD as Consulting Physician (Ophthalmology)  Indicate any recent Medical Services you may have received from other than [facility] providers in the past year (date may be approximate).     Assessment:   This is a routine wellness  examination for [place].  Hearing/Vision screen No results found.  Dietary issues and exercise activities  discussed:     Goals Addressed   None    Depression Screen    08/01/2023   10:58 AM 05/22/2023   11:05 AM 11/22/2022   11:35 AM 09/07/2022   10:00 AM 07/25/2022    1:26 PM 05/03/2022   10:07 AM 10/19/2021    1:01 PM  PHQ 2/9 Scores  PHQ - 2 Score 1 1 1 1 1 1 1   PHQ- 9 Score  2  1  1 1     Fall Risk    07/25/2023   10:03 AM 05/22/2023   10:31 AM 11/22/2022   11:35 AM 09/07/2022   10:07 AM 07/25/2022    1:27 PM  Fall Risk   Falls in the past year? 0 0 0 0 0  Number falls in past yr: 0 0 0 0 0  Injury with Fall? 0 0 0 0 0  Risk for fall due to : No Fall Risks    No Fall Risks  Follow up Falls evaluation completed Falls evaluation completed Falls evaluation completed Falls evaluation completed Falls evaluation completed    MEDICARE RISK AT HOME: Medicare Risk at Home Any stairs in or around the home?: No Home free of loose throw rugs in walkways, pet beds, electrical cords, etc?: Yes Adequate lighting in your home to reduce risk of falls?: Yes Life alert?: No Use of a cane, walker or w/c?: Yes Grab bars in the bathroom?: Yes Shower chair or bench in shower?: Yes Elevated toilet seat or a handicapped toilet?: Yes  TIMED UP AND GO:  Was the test performed?  Yes  Length of time to ambulate 10 feet: 8 sec Gait slow and steady without use of assistive device    Cognitive Function:    08/21/2017    1:35 PM  MMSE - Mini Mental State Exam  Orientation to time 5  Orientation to Place 5  Registration 3  Attention/ Calculation 5  Recall 3  Language- name 2 objects 2  Language- repeat 1  Language- follow 3 step command 3  Language- read & follow direction 1  Write a sentence 1  Copy design 1  Total score 30        08/01/2023   11:00 AM 07/25/2022    1:30 PM  6CIT Screen  What Year? 0 points 0 points  What month? 0 points 0 points  What time? 0 points 0 points  Count back from 20 0 points 4 points  Months in reverse 0 points 0 points  Repeat phrase 0 points 0  points  Total Score 0 points 4 points    Immunizations Immunization History  Administered Date(s) Administered   Covid-19, Mrna,Vaccine(Spikevax)2yrs and older 10/04/2022   Influenza Whole 09/05/2007, 08/13/2009, 08/27/2012   Influenza, High Dose Seasonal PF 08/20/2018, 10/02/2019, 08/30/2020, 08/29/2021, 09/20/2022   Influenza,inj,Quad PF,6+ Mos 08/25/2016   Influenza-Unspecified 08/26/2013, 08/28/2015, 08/28/2017   Moderna Sars-Covid-2 Vaccination 12/10/2019, 02/05/2020, 09/29/2020   PPD Test 05/16/2011   Pneumococcal Conjugate-13 05/17/2015   Pneumococcal Polysaccharide-23 05/18/2011, 05/05/2021   Tdap 05/16/2011, 04/03/2022   Zoster Recombinant(Shingrix) 05/20/2020, 08/26/2020    TDAP status: Up to date  Flu Vaccine status: Declined, Education has been provided regarding the importance of this vaccine but patient still declined. Advised may receive this vaccine at local pharmacy or Health Dept. Aware to provide a copy of the vaccination record if obtained from local pharmacy or Health Dept. Verbalized acceptance  and understanding.  Pneumococcal vaccine status: Up to date  Covid-19 vaccine status: Information provided on how to obtain vaccines.   Qualifies for Shingles Vaccine? Yes   Zostavax completed No   Shingrix Completed?: Yes  Screening Tests Health Maintenance  Topic Date Due   Medicare Annual Wellness (AWV)  07/26/2023   COVID-19 Vaccine (5 - 2023-24 season) 07/29/2023   INFLUENZA VACCINE  02/25/2024 (Originally 06/28/2023)   DTaP/Tdap/Td (3 - Td or Tdap) 04/03/2032   Pneumonia Vaccine 28+ Years old  Completed   Zoster Vaccines- Shingrix  Completed   HPV VACCINES  Aged Out    Health Maintenance  Health Maintenance Due  Topic Date Due   Medicare Annual Wellness (AWV)  07/26/2023   COVID-19 Vaccine (5 - 2023-24 season) 07/29/2023    Colorectal cancer screening: No longer required.   Lung Cancer Screening: (Low Dose CT Chest recommended if Age 50-80 years,  20 pack-year currently smoking OR have quit w/in 15years.) does not qualify.   Additional Screening:  Hepatitis C Screening: does not qualify  Vision Screening: Recommended annual ophthalmology exams for early detection of glaucoma and other disorders of the eye. Is the patient up to date with their annual eye exam?  Yes  Who is the provider or what is the name of the office in which the patient attends annual eye exams? [facility] If pt is not established with a provider, would they like to be referred to a provider to establish care? No .   Dental Screening: Recommended annual dental exams for proper oral hygiene  Diabetic Foot Exam: N/a  Community Resource Referral / Chronic Care Management: CRR required this visit?  No   CCM required this visit?  No     Plan:     I have personally reviewed and noted the following in the patient's chart:   Medical and social history Use of alcohol, tobacco or illicit drugs  Current medications and supplements including opioid prescriptions. Patient is not currently taking opioid prescriptions. Functional ability and status Nutritional status Physical activity Advanced directives List of other physicians Hospitalizations, surgeries, and ER visits in previous 12 months Vitals Screenings to include cognitive, depression, and falls Referrals and appointments  In addition, I have reviewed and discussed with patient certain preventive protocols, quality metrics, and best practice recommendations. A written personalized care plan for preventive services as well as general preventive health recommendations were provided to patient.     Donne Anon, CMA   08/01/2023   After Visit Summary: Sent to mychart  Nurse Notes: None

## 2023-08-01 NOTE — Patient Instructions (Signed)
[name] , Thank you for taking time to come for your Medicare Wellness Visit. I appreciate your ongoing commitment to your health goals. Please review the following plan we discussed and let me know if I can assist you in the future.     This is a list of the screening recommended for you and due dates:  Health Maintenance  Topic Date Due   COVID-19 Vaccine (5 - 2023-24 season) 07/29/2023   Flu Shot  02/25/2024*   Medicare Annual Wellness Visit  07/31/2024   DTaP/Tdap/Td vaccine (3 - Td or Tdap) 04/03/2032   Pneumonia Vaccine  Completed   Zoster (Shingles) Vaccine  Completed   HPV Vaccine  Aged Out  *Topic was postponed. The date shown is not the original due date.    Next appointment: Follow up in one year for your annual wellness visit.   Preventive Care 87 Years and Older, Male Preventive care refers to lifestyle choices and visits with your health care provider that can promote health and wellness. What does preventive care include? A yearly physical exam. This is also called an annual well check. Dental exams once or twice a year. Routine eye exams. Ask your health care provider how often you should have your eyes checked. Personal lifestyle choices, including: Daily care of your teeth and gums. Regular physical activity. Eating a healthy diet. Avoiding tobacco and drug use. Limiting alcohol use. Practicing safe sex. Taking low doses of aspirin every day. Taking vitamin and mineral supplements as recommended by your health care provider. What happens during an annual well check? The services and screenings done by your health care provider during your annual well check will depend on your age, overall health, lifestyle risk factors, and family history of disease. Counseling  Your health care provider may ask you questions about your: Alcohol use. Tobacco use. Drug use. Emotional well-being. Home and relationship well-being. Sexual activity. Eating habits. History  of falls. Memory and ability to understand (cognition). Work and work Astronomer. Screening  You may have the following tests or measurements: Height, weight, and BMI. Blood pressure. Lipid and cholesterol levels. These may be checked every 5 years, or more frequently if you are over 57 years old. Skin check. Lung cancer screening. You may have this screening every year starting at age 20 if you have a 30-pack-year history of smoking and currently smoke or have quit within the past 15 years. Fecal occult blood test (FOBT) of the stool. You may have this test every year starting at age 67. Flexible sigmoidoscopy or colonoscopy. You may have a sigmoidoscopy every 5 years or a colonoscopy every 10 years starting at age 69. Prostate cancer screening. Recommendations will vary depending on your family history and other risks. Hepatitis C blood test. Hepatitis B blood test. Sexually transmitted disease (STD) testing. Diabetes screening. This is done by checking your blood sugar (glucose) after you have not eaten for a while (fasting). You may have this done every 1-3 years. Abdominal aortic aneurysm (AAA) screening. You may need this if you are a current or former smoker. Osteoporosis. You may be screened starting at age 35 if you are at high risk. Talk with your health care provider about your test results, treatment options, and if necessary, the need for more tests. Vaccines  Your health care provider may recommend certain vaccines, such as: Influenza vaccine. This is recommended every year. Tetanus, diphtheria, and acellular pertussis (Tdap, Td) vaccine. You may need a Td booster every 10 years. Zoster  vaccine. You may need this after age 71. Pneumococcal 13-valent conjugate (PCV13) vaccine. One dose is recommended after age 37. Pneumococcal polysaccharide (PPSV23) vaccine. One dose is recommended after age 69. Talk to your health care provider about which screenings and vaccines you need  and how often you need them. This information is not intended to replace advice given to you by your health care provider. Make sure you discuss any questions you have with your health care provider. Document Released: 12/10/2015 Document Revised: 08/02/2016 Document Reviewed: 09/14/2015 Elsevier Interactive Patient Education  2017 [employer].  Fall Prevention in the Home Falls can cause injuries. They can happen to people of all ages. There are many things you can do to make your home safe and to help prevent falls. What can I do on the outside of my home? Regularly fix the edges of walkways and driveways and fix any cracks. Remove anything that might make you trip as you walk through a door, such as a raised step or threshold. Trim any bushes or trees on the path to your home. Use bright outdoor lighting. Clear any walking paths of anything that might make someone trip, such as rocks or tools. Regularly check to see if handrails are loose or broken. Make sure that both sides of any steps have handrails. Any raised decks and porches should have guardrails on the edges. Have any leaves, snow, or ice cleared regularly. Use sand or salt on walking paths during winter. Clean up any spills in your garage right away. This includes oil or grease spills. What can I do in the bathroom? Use night lights. Install grab bars by the toilet and in the tub and shower. Do not use towel bars as grab bars. Use non-skid mats or decals in the tub or shower. If you need to sit down in the shower, use a plastic, non-slip stool. Keep the floor dry. Clean up any water that spills on the floor as soon as it happens. Remove soap buildup in the tub or shower regularly. Attach bath mats securely with double-sided non-slip rug tape. Do not have throw rugs and other things on the floor that can make you trip. What can I do in the bedroom? Use night lights. Make sure that you have a light by your bed that is easy  to reach. Do not use any sheets or blankets that are too big for your bed. They should not hang down onto the floor. Have a firm chair that has side arms. You can use this for support while you get dressed. Do not have throw rugs and other things on the floor that can make you trip. What can I do in the kitchen? Clean up any spills right away. Avoid walking on wet floors. Keep items that you use a lot in easy-to-reach places. If you need to reach something above you, use a strong step stool that has a grab bar. Keep electrical cords out of the way. Do not use floor polish or wax that makes floors slippery. If you must use wax, use non-skid floor wax. Do not have throw rugs and other things on the floor that can make you trip. What can I do with my stairs? Do not leave any items on the stairs. Make sure that there are handrails on both sides of the stairs and use them. Fix handrails that are broken or loose. Make sure that handrails are as long as the stairways. Check any carpeting to make sure that it  is firmly attached to the stairs. Fix any carpet that is loose or worn. Avoid having throw rugs at the top or bottom of the stairs. If you do have throw rugs, attach them to the floor with carpet tape. Make sure that you have a light switch at the top of the stairs and the bottom of the stairs. If you do not have them, ask someone to add them for you. What else can I do to help prevent falls? Wear shoes that: Do not have high heels. Have rubber bottoms. Are comfortable and fit you well. Are closed at the toe. Do not wear sandals. If you use a stepladder: Make sure that it is fully opened. Do not climb a closed stepladder. Make sure that both sides of the stepladder are locked into place. Ask someone to hold it for you, if possible. Clearly mark and make sure that you can see: Any grab bars or handrails. First and last steps. Where the edge of each step is. Use tools that help you move  around (mobility aids) if they are needed. These include: Canes. Walkers. Scooters. Crutches. Turn on the lights when you go into a dark area. Replace any light bulbs as soon as they burn out. Set up your furniture so you have a clear path. Avoid moving your furniture around. If any of your floors are uneven, fix them. If there are any pets around you, be aware of where they are. Review your medicines with your doctor. Some medicines can make you feel dizzy. This can increase your chance of falling. Ask your doctor what other things that you can do to help prevent falls. This information is not intended to replace advice given to you by your health care provider. Make sure you discuss any questions you have with your health care provider. Document Released: 09/09/2009 Document Revised: 04/20/2016 Document Reviewed: 12/18/2014 Elsevier Interactive Patient Education  2017 [employer].

## 2023-08-01 NOTE — Patient Instructions (Signed)
Vaccines I recommend:  Flu shot this fall  New COVID-vaccine  Make an appointment, come back in 4 months, sooner if needed

## 2023-08-01 NOTE — Progress Notes (Signed)
Subjective:    Patient ID: [name], male    DOB: Jun 28, 1930, 89 y.o.   MRN: [id number]  DOS:  08/01/2023 Type of visit - description: Follow-up  Since the last visit, saw cardiology, notes reviewed. In general feels well although there are some days that he has low energy. Wonders if related to his history of COPD. Denies fever or chills. No cough, no chest congestion but sometimes needs to clear his throat. Complaining of pain at the thumbs bilaterally.  Denies any swelling, warmness or redness.  Review of Systems See above   Past Medical History:  Diagnosis Date   Anxiety    Atrial fibrillation (HCC)    Blood transfusion without reported diagnosis    CAD    a. 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 // Myoview 9/22: EF 57, normal perfusion; low risk   Cataract    Chronic abdominal pain    Chronic nausea    Coronary artery disease    Diverticulosis 2011   Diverticulitis 2004   Dyspepsia    Esophageal reflux    GERD (gastroesophageal reflux disease)    Headache    saw neuro 4-16, MRI done   History of echocardiogram    Echo 9/22: EF 60-65, no RWMA, normal diastolic function, GLS -24.9, normal RVSF, normal PASP, mild-moderate BAE, trivial MR, moderate MAC, moderate AV calcification, mild AI, mild aortic stenosis (mean gradient 5 mmHg, V-max 158 cm/s, DI 0.62), mild dilation of ascending aorta (40 mm)   HYPERPLASIA, PROSTATE NOS W/URINARY OBST/LUTS    IBS    INGUINAL HERNIA    Lichen planus 2006   Francesca Oman MD   LUMBAR RADICULOPATHY, RIGHT    Mixed hyperlipidemia    OSTEOPOROSIS    PONV (postoperative nausea and vomiting)    PREMATURE VENTRICULAR CONTRACTIONS    a. After CABG - was on Coumadin/Multaq for a period of time. Coumadin discontinued 04/2010 after maintaining NSR.   UNSPECIFIED ANEMIA     Past Surgical History:  Procedure Laterality Date   BIOPSY  09/10/2018   Procedure:  BIOPSY;  Surgeon: Beverley Fiedler, MD;  Location: [facility];  Service: Gastroenterology;;   CATARACT EXTRACTION Right 10/06/2013   CORONARY ARTERY BYPASS GRAFT     2 VD;anomalous RCA;post op compliacted by AF   ESOPHAGOGASTRODUODENOSCOPY (EGD) WITH PROPOFOL N/A 09/10/2018   Procedure: ESOPHAGOGASTRODUODENOSCOPY (EGD) WITH PROPOFOL;  Surgeon: Beverley Fiedler, MD;  Location: [facility];  Service: Gastroenterology;  Laterality: N/A;   HIP FRACTURE SURGERY Right 03/2007   Dr. Erin Sons   INGUINAL HERNIA REPAIR Left 2012   Dr Luisa Hart   KNEE SURGERY Right 1977   repair   PROSTATE BIOPSY  09/19/2013   [facility]    Current Outpatient Medications  Medication Instructions   acetaminophen (TYLENOL) 325-650 mg, Oral, Every 8 hours PRN   aspirin EC 81 mg, Oral, Daily   calcium carbonate (TUMS - DOSED IN MG ELEMENTAL CALCIUM) 500 MG chewable tablet 1 tablet, Oral, Daily   denosumab (PROLIA) 60 mg, Subcutaneous, Every 6 months   ELIQUIS 5 MG TABS tablet TAKE ONE (1) TABLET BY MOUTH TWO (2) TIMES DAILY   famotidine-calcium carbonate-magnesium hydroxide (PEPCID COMPLETE) 10-800-165 MG chewable tablet 1 tablet, Oral, Daily PRN   folic acid (FOLVITE) 1 mg, Oral, Daily, Please call [phone] to schedule an appointment for August for future refills. Thank you.   loratadine (CLARITIN) 10 mg, Oral,  Daily PRN   Multiple Vitamin (MULITIVITAMIN WITH MINERALS) TABS 1 tablet, Oral, Daily,     nitroGLYCERIN (NITROSTAT) 0.4 MG SL tablet DISSOLVE 1 TABLET UNDER TONGUE EVERY 5 MINUTES FOR UP TO 3 DOSES AS NEEDED FOR CHEST PAIN   rosuvastatin (CRESTOR) 10 mg, Oral, Daily   Vitamin D-3 1,000 Units, Oral, Daily with breakfast       Objective:   Physical Exam BP (!) 113/56   Pulse 78   Ht 6\' 2"  (1.88 m)   Wt 154 lb (69.9 kg)   SpO2 99%   BMI 19.77 kg/m  General:   Well developed, NAD, BMI noted.  O2 sat 99% HEENT:  Normocephalic . Face symmetric, atraumatic Lungs:  CTA B Normal respiratory  effort, no intercostal retractions, no accessory muscle use. Heart: Irregularly irregular.  Lower extremities: no pretibial edema bilaterally Hands: Bony changes consistent with DJD throughout Skin: Not pale. Not jaundice Neurologic:  alert & oriented X3.  Speech normal, gait appropriate for age and unassisted Psych--  Cognition and judgment appear intact.  Cooperative with normal attention span and concentration.  Behavior appropriate. No anxious or depressed appearing.      Assessment   Assessment Prediabetes, A1c 6.0 (2015) Hyperlipidemia Anxiety, depression:  -intol to zoloft, Rx lexapro 5 mg 07-2015: intolerant d/t nausea  -lost wife July 2017 CV: ---CAD--CABG 2011 ----P- Atrial fibrillation, PVCs.  Elliquis ; Multaq d/c d/t GI s/e ----Palpable Ao x years, [place] (-) for AAA 2005 COPD (per CXR 06-2015), PFTs 10-04-15 mild obstruction DJD  Gait-- uses a walker prn GI: --GERD, IBS, diverticulosis --Chronic nausea: 2019.  Work-up: 08-2018: Negative EGD, CT  and US abdomen. 12/2018 wnl Gastric empty stomach.  Symptoms decreased after Multaq stopped GU: elevated PSA, BPH , (-) bx 2014 Osteoporosis: -hip FX 2008 - dexa ~2005 --> rx fosamax, took x 1 year, dexa ~2010 was rec no fosamax at that time. Dexa 09-2015: T score -2.4 --> rx fosamax, d/c d/t jaw pain 06-2017; took prolia #25 February 2018; T score (-) 30 December 2018, per Endo Lichen planus: Well-controlled per last FLP Headache -- saw neurology 02-2015, had a MRI/MRA (-) COPD: PFTs 08-2020: Mild obstruction with some air trapping.  Insignificant response to bronchodilators.   PLAN DJD: Complain of pain in both thumbs, exam consistent with DJD.  Recommend observation.  To let me know if he ever develops swelling or redness at any joint.   CAD, paroxysmal A-fib 07/09/2023: Saw cardiology, he had persistent dizziness and fatigue.  No angina symptoms.  They ordered an echo Zio patch; Echo was okay and they are planning the next echo  in 1-year.  Zio patch pending. Today reports occasional lack of energy.  On chart review he does not have anemia, vitamin levels have been checked over the last few years and essentially normal.  He wonders about emphysema but he has minimal symptoms and O2 sat is 99% at rest. For now recommend observation.  Overall I think she is doing okay and the patient agreed with me Fatigue: See above. Hyperkalemia: At LOV, potassium elevated, follow-up BMP normal. BPH, abnormal DRE: Since LOV, PSA was elevated but stable.  Has some leukocyturia with NEG UCX, started empiric Flomax without much help.  Plan: Stop Flomax, observation. Vaccine advice provided. RTC 4 months.  Time spent 37 minutes, chart reviewed, multiple questions about arthritis and occasional lack of energy reviewed, she needed some reassurance that he is doing okay which he is.

## 2023-08-02 DIAGNOSIS — Z961 Presence of intraocular lens: Secondary | ICD-10-CM | POA: Diagnosis not present

## 2023-08-02 DIAGNOSIS — R55 Syncope and collapse: Secondary | ICD-10-CM | POA: Diagnosis not present

## 2023-08-02 DIAGNOSIS — H04123 Dry eye syndrome of bilateral lacrimal glands: Secondary | ICD-10-CM | POA: Diagnosis not present

## 2023-08-02 DIAGNOSIS — H26492 Other secondary cataract, left eye: Secondary | ICD-10-CM | POA: Diagnosis not present

## 2023-08-02 DIAGNOSIS — I480 Paroxysmal atrial fibrillation: Secondary | ICD-10-CM | POA: Diagnosis not present

## 2023-08-03 ENCOUNTER — Other Ambulatory Visit (HOSPITAL_COMMUNITY): Payer: Medicare Other

## 2023-08-08 ENCOUNTER — Other Ambulatory Visit: Payer: Self-pay | Admitting: *Deleted

## 2023-08-08 ENCOUNTER — Other Ambulatory Visit: Payer: Self-pay | Admitting: Cardiovascular Disease

## 2023-08-08 DIAGNOSIS — I480 Paroxysmal atrial fibrillation: Secondary | ICD-10-CM

## 2023-08-08 MED ORDER — AMIODARONE HCL 200 MG PO TABS: 200.0000 mg | ORAL_TABLET | Freq: Two times a day (BID) | ORAL | 3 refills | Status: DC

## 2023-08-08 MED ORDER — FOLIC ACID 1 MG PO TABS: 1.0000 mg | ORAL_TABLET | Freq: Every day | ORAL | 1 refills | Status: DC

## 2023-08-08 MED ORDER — APIXABAN 5 MG PO TABS: 5.0000 mg | ORAL_TABLET | Freq: Two times a day (BID) | ORAL | 5 refills | Status: DC

## 2023-08-08 NOTE — Telephone Encounter (Signed)
Prescription refill request for Eliquis received. Indication: Afib  Last office visit: 07/09/23 Alben Spittle)  Scr: 0.78 (07/06/23)  Age: 89 Weight: 69.9kg  Appropriate dose. Refill sent.

## 2023-08-08 NOTE — Telephone Encounter (Signed)
-----   Message from Tereso Newcomer sent at 08/07/2023  5:50 PM EDT ----- Results sent to [name] via MyChart. See MyChart comments below. PLAN:  -Start Amiodarone 200 mg twice daily  -Continue all other medications -Follow up in Oct as planned.   [name]  Your monitor shows that you have been in atrial fibrillation 100% of the time. I reviewed this with Dr. Eden Emms. I am going to start you on a medication called Amiodarone. This is a medication to help return your heart to normal rhythm. Make sure you stay on the Eliquis. Once the Amiodarone has built up in your system over the next few weeks, we can try to arrange a Cardioversion. This is a procedure to force your heart back in rhythm. We can discuss this at your follow up on 10/1. Tereso Newcomer, PA-C

## 2023-08-08 NOTE — Telephone Encounter (Signed)
Pt has been made aware of his results.  Rx sent in for Amiodarone 200 mg bid to [facility].  Pt also advised that he needed a refill on Folic Acid and Eliquis.  I have sent in the Folic Acid and will fwd the Eliquis to our anticoagulation dept.

## 2023-08-22 DIAGNOSIS — L720 Epidermal cyst: Secondary | ICD-10-CM | POA: Diagnosis not present

## 2023-08-22 DIAGNOSIS — D1801 Hemangioma of skin and subcutaneous tissue: Secondary | ICD-10-CM | POA: Diagnosis not present

## 2023-08-22 DIAGNOSIS — D485 Neoplasm of uncertain behavior of skin: Secondary | ICD-10-CM | POA: Diagnosis not present

## 2023-08-22 DIAGNOSIS — B351 Tinea unguium: Secondary | ICD-10-CM | POA: Diagnosis not present

## 2023-08-22 DIAGNOSIS — Z85828 Personal history of other malignant neoplasm of skin: Secondary | ICD-10-CM | POA: Diagnosis not present

## 2023-08-22 DIAGNOSIS — L821 Other seborrheic keratosis: Secondary | ICD-10-CM | POA: Diagnosis not present

## 2023-08-22 DIAGNOSIS — D2261 Melanocytic nevi of right upper limb, including shoulder: Secondary | ICD-10-CM | POA: Diagnosis not present

## 2023-08-22 DIAGNOSIS — D225 Melanocytic nevi of trunk: Secondary | ICD-10-CM | POA: Diagnosis not present

## 2023-08-22 DIAGNOSIS — L814 Other melanin hyperpigmentation: Secondary | ICD-10-CM | POA: Diagnosis not present

## 2023-08-22 DIAGNOSIS — L570 Actinic keratosis: Secondary | ICD-10-CM | POA: Diagnosis not present

## 2023-08-27 NOTE — Progress Notes (Unsigned)
Cardiology Office Note:    Date:  08/28/2023  ID:  [name], DOB 09/03/1930, MRN [id number] PCP: Wanda Plump, MD  [facility] Providers Cardiologist:  Charlton Haws, MD       Patient Profile:      Coronary artery disease  S/p CABG in 2011 Myoview 07/29/21: EF 57, normal perfusion, low risk Paroxysmal atrial fibrillation Dronedarone DC'd secondary to GI side effects; Not on beta-blocker due to bradycardia Anticoagulation: Apixaban Monitor 07/2021: NSR, ATach longest 13 beats Monitor 06/2023: 100% AFib TTE 08/30/15: EF 55-60, no RWMA, GR 1 DD, trivial AI, mild RAE  TTE 07/27/23: EF 60-65, no RWMA, mild LVH, NL RVSF, mod BAE, mild MR, mild AI, asc Aorta 40 mm, RAP 3, AV calcified but no AS by doppler Hyperlipidemia Ascending aorta dilation  CT 05/18/22: ascending aorta ectasia, 3.8 cm  TTE 06/2023: 40 mm  Osteoporosis Elevated PSA GERD          History of Present Illness:  Discussed the use of AI scribe software for clinical note transcription with the patient, who gave verbal consent to proceed.  [name] is a 89 y.o. male who returns for follow up of atrial fibrillation. He was last seen 07/09/23. He noted symptoms of dizziness that seemed to coincide with being back in atrial fibrillation. A follow up monitor demonstrated 100% AFib burden. An echocardiogram showed mod BAE and normal EF. I started him on Amiodarone with plans to proceed with DCCV if he remains in AFib.   He is here alone.  The patient reports occasional constipation since starting amiodarone, which he manages with Dulcolax. The patient denies chest pain or palpitations. The patient also reports arthritis in the neck.  He does describe lightheadedness at times.  He thinks it may be associated with his neck pain. The patient denies any swelling in the legs, passing out, or any bleeding.  Of note, he is excited to go on the [employer] for veterans tomorrow to [place].     Review of Systems  Gastrointestinal:   Positive for constipation.  See HPI     Studies Reviewed:   EKG Interpretation Date/Time:  Tuesday August 28 2023 09:46:48 EDT Ventricular Rate:  79 PR Interval:    QRS Duration:  116 QT Interval:  402 QTC Calculation: 460 R Axis:   2  Text Interpretation: Atrial fibrillation Right bundle branch block No significant change since last tracing Confirmed by Tereso Newcomer [phone]) on 08/28/2023 9:51:43 AM    Risk Assessment/Calculations:    CHA2DS2-VASc Score = 3   This indicates a 3.2% annual risk of stroke. The patient's score is based upon: CHF History: 0 HTN History: 0 Diabetes History: 0 Stroke History: 0 Vascular Disease History: 1 Age Score: 2 Gender Score: 0            Physical Exam:   VS:  BP 114/66   Pulse 79   Ht 6\' 2"  (1.88 m)   Wt 153 lb 12.8 oz (69.8 kg)   SpO2 98%   BMI 19.75 kg/m    Wt Readings from Last 3 Encounters:  08/28/23 153 lb 12.8 oz (69.8 kg)  08/01/23 154 lb (69.9 kg)  08/01/23 154 lb 3.2 oz (69.9 kg)    Constitutional:      Appearance: Healthy appearance. Not in distress.  Neck:     Vascular: JVD normal.  Pulmonary:     Breath sounds: Normal breath sounds. No wheezing. No rales.  Cardiovascular:  Normal rate. Irregularly irregular rhythm.     Murmurs: There is no murmur.  Edema:    Peripheral edema absent.  Abdominal:     Palpations: Abdomen is soft.      Assessment and Plan:   Assessment & Plan Persistent atrial fibrillation (HCC) Persistent symptoms of dizziness, unclear if related to atrial fibrillation or neck arthritis. 100% atrial fibrillation burden on recent monitor.  He was started on Amiodarone 200mg  twice daily 1 month ago.  He has not missed any doses of Eliquis. -Continue Eliquis 5mg  twice daily. -Decrease Amiodarone to 200mg  daily. -Arrange for cardioversion later this week. -Follow up post cardioversion. -Periodically check liver and thyroid function due to Amiodarone use. -Annual eye exam due to Amiodarone  use. Coronary artery disease involving coronary bypass graft of native heart without angina pectoris Status post CABG in 2011.  Stable without chest pain to suggest angina.  -Continue Eliquis 5mg  twice daily, Nitroglycerin, and Crestor 10mg  daily. -Discontinue Aspirin. Mixed hyperlipidemia LDL optimal at 27 in June 3086. Currently on Crestor 10mg  daily. -Continue Crestor 10mg  daily. Chronic bronchitis, unspecified chronic bronchitis type (HCC) PFTs in 2021 demonstrated mild obstruction and minimal decrease in diffusion capacity.  He is asymptomatic and does not use any inhaled medications.  At this point, I think it is safe for him to be on amiodarone.    Informed Consent   Shared Decision Making/Informed Consent The risks (stroke, cardiac arrhythmias rarely resulting in the need for a temporary or permanent pacemaker, skin irritation or burns and complications associated with conscious sedation including aspiration, arrhythmia, respiratory failure and death), benefits (restoration of normal sinus rhythm) and alternatives of a direct current cardioversion were explained in detail to [name] and he agrees to proceed.       Dispo:  Return for Post Procedure Follow Up with Dr. Eden Emms, or Tereso Newcomer, PA-C.  Signed, Tereso Newcomer, PA-C

## 2023-08-27 NOTE — H&P (View-Only) (Signed)
Cardiology Office Note:    Date:  08/28/2023  ID:  [name], DOB 09/03/1930, MRN [id number] PCP: Wanda Plump, MD  [facility] Providers Cardiologist:  Charlton Haws, MD       Patient Profile:      Coronary artery disease  S/p CABG in 2011 Myoview 07/29/21: EF 57, normal perfusion, low risk Paroxysmal atrial fibrillation Dronedarone DC'd secondary to GI side effects; Not on beta-blocker due to bradycardia Anticoagulation: Apixaban Monitor 07/2021: NSR, ATach longest 13 beats Monitor 06/2023: 100% AFib TTE 08/30/15: EF 55-60, no RWMA, GR 1 DD, trivial AI, mild RAE  TTE 07/27/23: EF 60-65, no RWMA, mild LVH, NL RVSF, mod BAE, mild MR, mild AI, asc Aorta 40 mm, RAP 3, AV calcified but no AS by doppler Hyperlipidemia Ascending aorta dilation  CT 05/18/22: ascending aorta ectasia, 3.8 cm  TTE 06/2023: 40 mm  Osteoporosis Elevated PSA GERD          History of Present Illness:  Discussed the use of AI scribe software for clinical note transcription with the patient, who gave verbal consent to proceed.  [name] is a 89 y.o. male who returns for follow up of atrial fibrillation. He was last seen 07/09/23. He noted symptoms of dizziness that seemed to coincide with being back in atrial fibrillation. A follow up monitor demonstrated 100% AFib burden. An echocardiogram showed mod BAE and normal EF. I started him on Amiodarone with plans to proceed with DCCV if he remains in AFib.   He is here alone.  The patient reports occasional constipation since starting amiodarone, which he manages with Dulcolax. The patient denies chest pain or palpitations. The patient also reports arthritis in the neck.  He does describe lightheadedness at times.  He thinks it may be associated with his neck pain. The patient denies any swelling in the legs, passing out, or any bleeding.  Of note, he is excited to go on the [employer] for veterans tomorrow to [place].     Review of Systems  Gastrointestinal:   Positive for constipation.  See HPI     Studies Reviewed:   EKG Interpretation Date/Time:  Tuesday August 28 2023 09:46:48 EDT Ventricular Rate:  79 PR Interval:    QRS Duration:  116 QT Interval:  402 QTC Calculation: 460 R Axis:   2  Text Interpretation: Atrial fibrillation Right bundle branch block No significant change since last tracing Confirmed by Tereso Newcomer [phone]) on 08/28/2023 9:51:43 AM    Risk Assessment/Calculations:    CHA2DS2-VASc Score = 3   This indicates a 3.2% annual risk of stroke. The patient's score is based upon: CHF History: 0 HTN History: 0 Diabetes History: 0 Stroke History: 0 Vascular Disease History: 1 Age Score: 2 Gender Score: 0            Physical Exam:   VS:  BP 114/66   Pulse 79   Ht 6\' 2"  (1.88 m)   Wt 153 lb 12.8 oz (69.8 kg)   SpO2 98%   BMI 19.75 kg/m    Wt Readings from Last 3 Encounters:  08/28/23 153 lb 12.8 oz (69.8 kg)  08/01/23 154 lb (69.9 kg)  08/01/23 154 lb 3.2 oz (69.9 kg)    Constitutional:      Appearance: Healthy appearance. Not in distress.  Neck:     Vascular: JVD normal.  Pulmonary:     Breath sounds: Normal breath sounds. No wheezing. No rales.  Cardiovascular:  Normal rate. Irregularly irregular rhythm.     Murmurs: There is no murmur.  Edema:    Peripheral edema absent.  Abdominal:     Palpations: Abdomen is soft.      Assessment and Plan:   Assessment & Plan Persistent atrial fibrillation (HCC) Persistent symptoms of dizziness, unclear if related to atrial fibrillation or neck arthritis. 100% atrial fibrillation burden on recent monitor.  He was started on Amiodarone 200mg  twice daily 1 month ago.  He has not missed any doses of Eliquis. -Continue Eliquis 5mg  twice daily. -Decrease Amiodarone to 200mg  daily. -Arrange for cardioversion later this week. -Follow up post cardioversion. -Periodically check liver and thyroid function due to Amiodarone use. -Annual eye exam due to Amiodarone  use. Coronary artery disease involving coronary bypass graft of native heart without angina pectoris Status post CABG in 2011.  Stable without chest pain to suggest angina.  -Continue Eliquis 5mg  twice daily, Nitroglycerin, and Crestor 10mg  daily. -Discontinue Aspirin. Mixed hyperlipidemia LDL optimal at 27 in June 3086. Currently on Crestor 10mg  daily. -Continue Crestor 10mg  daily. Chronic bronchitis, unspecified chronic bronchitis type (HCC) PFTs in 2021 demonstrated mild obstruction and minimal decrease in diffusion capacity.  He is asymptomatic and does not use any inhaled medications.  At this point, I think it is safe for him to be on amiodarone.    Informed Consent   Shared Decision Making/Informed Consent The risks (stroke, cardiac arrhythmias rarely resulting in the need for a temporary or permanent pacemaker, skin irritation or burns and complications associated with conscious sedation including aspiration, arrhythmia, respiratory failure and death), benefits (restoration of normal sinus rhythm) and alternatives of a direct current cardioversion were explained in detail to [name] and he agrees to proceed.       Dispo:  Return for Post Procedure Follow Up with Dr. Eden Emms, or Tereso Newcomer, PA-C.  Signed, Tereso Newcomer, PA-C

## 2023-08-28 ENCOUNTER — Ambulatory Visit: Payer: Medicare Other | Attending: Physician Assistant | Admitting: Physician Assistant

## 2023-08-28 ENCOUNTER — Encounter: Payer: Self-pay | Admitting: Physician Assistant

## 2023-08-28 VITALS — BP 114/66 | HR 79 | Ht 74.0 in | Wt 153.8 lb

## 2023-08-28 DIAGNOSIS — E782 Mixed hyperlipidemia: Secondary | ICD-10-CM

## 2023-08-28 DIAGNOSIS — I77810 Thoracic aortic ectasia: Secondary | ICD-10-CM | POA: Diagnosis not present

## 2023-08-28 DIAGNOSIS — I4819 Other persistent atrial fibrillation: Secondary | ICD-10-CM | POA: Diagnosis not present

## 2023-08-28 DIAGNOSIS — I25810 Atherosclerosis of coronary artery bypass graft(s) without angina pectoris: Secondary | ICD-10-CM

## 2023-08-28 DIAGNOSIS — J42 Unspecified chronic bronchitis: Secondary | ICD-10-CM

## 2023-08-28 MED ORDER — AMIODARONE HCL 200 MG PO TABS: 200.0000 mg | ORAL_TABLET | Freq: Every day | ORAL | 3 refills | Status: DC

## 2023-08-28 NOTE — Assessment & Plan Note (Signed)
PFTs in 2021 demonstrated mild obstruction and minimal decrease in diffusion capacity.  He is asymptomatic and does not use any inhaled medications.  At this point, I think it is safe for him to be on amiodarone.

## 2023-08-28 NOTE — Patient Instructions (Addendum)
Medication Instructions:  Your physician has recommended you make the following change in your medication: 1.  STOP Aspirin 2.  REDUCE Amiodarone to 200 mg taking 1 daily    *If you need a refill on your cardiac medications before your next appointment, please call your pharmacy*   Lab Work: TODAY:  BMET & CBC  If you have labs (blood work) drawn today and your tests are completely normal, you will receive your results only by: MyChart Message (if you have MyChart) OR A paper copy in the mail If you have any lab test that is abnormal or we need to change your treatment, we will call you to review the results.   Testing/Procedures: Your physician has recommended that you have a Cardioversion (DCCV). Electrical Cardioversion uses a jolt of electricity to your heart either through paddles or wired patches attached to your chest. This is a controlled, usually prescheduled, procedure. Defibrillation is done under light anesthesia in the hospital, and you usually go home the day of the procedure. This is done to get your heart back into a normal rhythm. You are not awake for the procedure. Please see the instruction sheet BELOW:      Dear [name]  You are scheduled for a Cardioversion on Friday, October 4 with Dr. Odis Hollingshead.  Please arrive at the [facility] (Main Entrance A) at [facility]: [address] [place], [place] [address] at 7:30 AM (This time is 1 hour(s) before your procedure to ensure your preparation). Free valet parking service is available. You will check in at ADMITTING. The support person will be asked to wait in the waiting room.  It is OK to have someone drop you off and come back when you are ready to be discharged.      DIET:  Nothing to eat or drink after midnight except a sip of water with medications (see medication instructions below)  MEDICATION INSTRUCTIONS: !!IF ANY NEW MEDICATIONS ARE STARTED AFTER TODAY, PLEASE NOTIFY YOUR PROVIDER AS SOON AS POSSIBLE!!   FYI: Medications such as Semaglutide (Ozempic, [place]), Tirzepatide (Mounjaro, Zepbound), Dulaglutide (Trulicity), etc ("GLP1 agonists") AND Canagliflozin (Invokana), Dapagliflozin (Farxiga), Empagliflozin (Jardiance), Ertugliflozin (Steglatro), Bexagliflozin [employer]) or any combination with one of these drugs such as Invokamet (Canagliflozin/Metformin), Synjardy (Empagliflozin/Metformin), etc ("SGLT2 inhibitors") must be held around the time of a procedure. This is not a comprehensive list of all of these drugs. Please review all of your medications and talk to your provider if you take any one of these. If you are not sure, ask your provider.     Continue taking your anticoagulant (blood thinner): Apixaban (Eliquis).  You will need to continue this after your procedure until you are told by your provider that it is safe to stop.    LABS: TODAY  FYI:  For your safety, and to allow [place] to monitor your vital signs accurately during the surgery/procedure we request: If you have artificial nails, gel coating, SNS etc, please have those removed prior to your surgery/procedure. Not having the nail coverings /polish removed may result in cancellation or delay of your surgery/procedure.  You must have a responsible person to drive you home and stay in the waiting area during your procedure. Failure to do so could result in cancellation.  Bring your insurance cards.  *Special Note: Every effort is made to have your procedure done on time. Occasionally there are emergencies that occur at the hospital that may cause delays. Please be patient if a delay does  occur.      Follow-Up: At [facility], you and your health needs are our priority.  As part of our continuing mission to provide you with exceptional heart care, we have created designated Provider Care Teams.  These Care Teams include your primary Cardiologist (physician) and Advanced Practice Providers (APPs -  Physician Assistants and  Nurse Practitioners) who all work together to provide you with the care you need, when you need it.  We recommend signing up for the patient portal called "MyChart".  Sign up information is provided on this After Visit Summary.  MyChart is used to connect with patients for Virtual Visits (Telemedicine).  Patients are able to view lab/test results, encounter notes, upcoming appointments, etc.  Non-urgent messages can be sent to your provider as well.   To learn more about what you can do with MyChart, go to [email or web address].    Your next appointment:   2 week(s)  09/14/2023 ARRIVE AT 9:45  Provider:   Charlton Haws, MD  or Tereso Newcomer, PA-C         Other Instructions

## 2023-08-28 NOTE — Assessment & Plan Note (Signed)
Status post CABG in 2011.  Stable without chest pain to suggest angina.  -Continue Eliquis 5mg  twice daily, Nitroglycerin, and Crestor 10mg  daily. -Discontinue Aspirin.

## 2023-08-28 NOTE — Assessment & Plan Note (Signed)
Persistent symptoms of dizziness, unclear if related to atrial fibrillation or neck arthritis. 100% atrial fibrillation burden on recent monitor.  He was started on Amiodarone 200mg  twice daily 1 month ago.  He has not missed any doses of Eliquis. -Continue Eliquis 5mg  twice daily. -Decrease Amiodarone to 200mg  daily. -Arrange for cardioversion later this week. -Follow up post cardioversion. -Periodically check liver and thyroid function due to Amiodarone use. -Annual eye exam due to Amiodarone use.

## 2023-08-28 NOTE — Assessment & Plan Note (Signed)
LDL optimal at 27 in June 4098. Currently on Crestor 10mg  daily. -Continue Crestor 10mg  daily.

## 2023-08-29 LAB — BASIC METABOLIC PANEL
BUN/Creatinine Ratio: 24 (ref 10–24)
BUN: 19 mg/dL (ref 10–36)
CO2: 27 mmol/L (ref 20–29)
Calcium: 10 mg/dL (ref 8.6–10.2)
Chloride: 99 mmol/L (ref 96–106)
Creatinine, Ser: 0.79 mg/dL (ref 0.76–1.27)
Glucose: 95 mg/dL (ref 70–99)
Potassium: 5.6 mmol/L — ABNORMAL HIGH (ref 3.5–5.2)
Sodium: 139 mmol/L (ref 134–144)
eGFR: 84 mL/min/{1.73_m2} (ref >59)

## 2023-08-29 LAB — CBC
Hematocrit: 44.1 % (ref 37.5–51.0)
Hemoglobin: 14.1 g/dL (ref 13.0–17.7)
MCH: 31.2 pg (ref 26.6–33.0)
MCHC: 32 g/dL (ref 31.5–35.7)
MCV: 98 fL — ABNORMAL HIGH (ref 79–97)
Platelets: 174 10*3/uL (ref 150–450)
RBC: 4.52 x10E6/uL (ref 4.14–5.80)
RDW: 13.7 % (ref 11.6–15.4)
WBC: 9.5 10*3/uL (ref 3.4–10.8)

## 2023-08-30 ENCOUNTER — Telehealth: Payer: Self-pay | Admitting: *Deleted

## 2023-08-30 DIAGNOSIS — I4819 Other persistent atrial fibrillation: Secondary | ICD-10-CM

## 2023-08-30 NOTE — Telephone Encounter (Signed)
-----   Message from Tereso Newcomer sent at 08/29/2023  6:04 PM EDT ----- Results sent to Ezequiel Kayser via MyChart. See MyChart comments below. PLAN: -Repeat stat BMET tomorrow 08/30/23 -Decrease dietary potassium   Mr. Habib Your kidney function (Creatinine) is normal. Your potassium is elevated.  Your blood count (hemoglobin) is normal.  Use caution with excess dietary potassium.  We will repeat your potassium again tomorrow to ensure it is not worse. Tereso Newcomer, PA-C

## 2023-08-30 NOTE — Anesthesia Preprocedure Evaluation (Addendum)
Anesthesia Evaluation  Patient identified by MRN, date of birth, ID band Patient awake    Reviewed: Allergy & Precautions, NPO status , Patient's Chart, lab work & pertinent test results  History of Anesthesia Complications (+) PONV and history of anesthetic complications  Airway Mallampati: II  TM Distance: >3 FB Neck ROM: Full    Dental  (+) Missing, Dental Advisory Given   Pulmonary COPD, former smoker   Pulmonary exam normal breath sounds clear to auscultation       Cardiovascular + CAD and + CABG  Normal cardiovascular exam+ dysrhythmias Atrial Fibrillation  Rhythm:Regular Rate:Normal  Echo 07/27/2023  1. Visually aortic valve is calcied with reduced cusp excursion but AS not noted by doppler.   2. Left ventricular ejection fraction, by estimation, is 60 to 65%. The left ventricle has normal function. The left ventricle has no regional wall motion abnormalities. There is mild left ventricular hypertrophy. Left ventricular diastolic parameters are indeterminate.   3. Right ventricular systolic function is normal. The right ventricular size is normal.   4. Left atrial size was moderately dilated.   5. Right atrial size was moderately dilated.   6. The mitral valve is normal in structure. Mild mitral valve regurgitation. No evidence of mitral stenosis.   7. The aortic valve is tricuspid. Aortic valve regurgitation is mild. No aortic stenosis is present.   8. Aortic dilatation noted. There is mild dilatation of the ascending aorta, measuring 40 mm.   9. The inferior vena cava is normal in size with greater than 50% respiratory variability, suggesting right atrial pressure of 3 mmHg.     Neuro/Psych  Headaches PSYCHIATRIC DISORDERS Anxiety Depression     Neuromuscular disease    GI/Hepatic Neg liver ROS,GERD  Medicated and Controlled,,  Endo/Other  negative endocrine ROS    Renal/GU negative Renal ROS      Musculoskeletal  (+) Arthritis ,    Abdominal   Peds  Hematology  (+) Blood dyscrasia, anemia   Anesthesia Other Findings On eliquis   epigastric pain  Reproductive/Obstetrics                             Anesthesia Physical Anesthesia Plan  ASA: 3  Anesthesia Plan: General   Post-op Pain Management: Minimal or no pain anticipated   Induction: Intravenous  PONV Risk Score and Plan: 3 and Treatment may vary due to age or medical condition and Propofol infusion  Airway Management Planned: Natural Airway and Simple Face Mask  Additional Equipment:   Intra-op Plan:   Post-operative Plan:   Informed Consent: I have reviewed the patients History and Physical, chart, labs and discussed the procedure including the risks, benefits and alternatives for the proposed anesthesia with the patient or authorized representative who has indicated his/her understanding and acceptance.     Dental advisory given  Plan Discussed with: CRNA  Anesthesia Plan Comments:         Anesthesia Quick Evaluation

## 2023-08-30 NOTE — Telephone Encounter (Signed)
I called the patient.  He wishes to go to his PCP for the blood work since it is much closer.   [facility] on [address] Dr. Drue Novel office.  Called Dr. Drue Novel office.  They advise that he go to the [facility] in their building.  I placed the order stat and released.   Called patient back to make sure he is aware of location of lab.  Going this morning.   He does eat a bit of tomatoes and greens often, salmon.   He will cut back a little bit.

## 2023-08-30 NOTE — Progress Notes (Addendum)
Spoke with patient, Procedure scheduled for 0900, Please arrive at the hospital at 0745, NPO after midnight on  Thursday, May take meds with sips of water in the AM, please have transportation for home post procedure, and someone to stay with pt for approximately 24 hours after  Pt states he has been taking eliquis regularly with no missed doses

## 2023-08-31 ENCOUNTER — Encounter: Payer: Self-pay | Admitting: *Deleted

## 2023-08-31 ENCOUNTER — Other Ambulatory Visit: Payer: Self-pay

## 2023-08-31 ENCOUNTER — Telehealth: Payer: Self-pay

## 2023-08-31 ENCOUNTER — Ambulatory Visit (HOSPITAL_COMMUNITY): Payer: Medicare Other | Admitting: Anesthesiology

## 2023-08-31 ENCOUNTER — Ambulatory Visit (HOSPITAL_COMMUNITY)
Admission: RE | Admit: 2023-08-31 | Discharge: 2023-08-31 | Disposition: A | Discharge status: Skilled Nursing Facility | Payer: Medicare Other | Attending: Cardiology | Admitting: Cardiology

## 2023-08-31 ENCOUNTER — Encounter (HOSPITAL_COMMUNITY)
Admission: RE | Disposition: A | Discharge status: Skilled Nursing Facility | Payer: Self-pay | Source: Home / Self Care | Attending: Cardiology

## 2023-08-31 DIAGNOSIS — I2510 Atherosclerotic heart disease of native coronary artery without angina pectoris: Secondary | ICD-10-CM | POA: Insufficient documentation

## 2023-08-31 DIAGNOSIS — Z7901 Long term (current) use of anticoagulants: Secondary | ICD-10-CM | POA: Insufficient documentation

## 2023-08-31 DIAGNOSIS — Z951 Presence of aortocoronary bypass graft: Secondary | ICD-10-CM | POA: Diagnosis not present

## 2023-08-31 DIAGNOSIS — Z87891 Personal history of nicotine dependence: Secondary | ICD-10-CM | POA: Diagnosis not present

## 2023-08-31 DIAGNOSIS — K219 Gastro-esophageal reflux disease without esophagitis: Secondary | ICD-10-CM | POA: Insufficient documentation

## 2023-08-31 DIAGNOSIS — J42 Unspecified chronic bronchitis: Secondary | ICD-10-CM | POA: Insufficient documentation

## 2023-08-31 DIAGNOSIS — I4891 Unspecified atrial fibrillation: Secondary | ICD-10-CM | POA: Diagnosis not present

## 2023-08-31 DIAGNOSIS — Z79899 Other long term (current) drug therapy: Secondary | ICD-10-CM | POA: Insufficient documentation

## 2023-08-31 DIAGNOSIS — J449 Chronic obstructive pulmonary disease, unspecified: Secondary | ICD-10-CM | POA: Diagnosis not present

## 2023-08-31 DIAGNOSIS — I4819 Other persistent atrial fibrillation: Secondary | ICD-10-CM | POA: Diagnosis not present

## 2023-08-31 DIAGNOSIS — E782 Mixed hyperlipidemia: Secondary | ICD-10-CM | POA: Diagnosis not present

## 2023-08-31 HISTORY — PX: CARDIOVERSION: SHX1299

## 2023-08-31 LAB — BASIC METABOLIC PANEL
BUN/Creatinine Ratio: 21 (ref 10–24)
BUN: 23 mg/dL (ref 10–36)
CO2: 25 mmol/L (ref 20–29)
Calcium: 9.6 mg/dL (ref 8.6–10.2)
Chloride: 103 mmol/L (ref 96–106)
Creatinine, Ser: 1.1 mg/dL (ref 0.76–1.27)
Glucose: 89 mg/dL (ref 70–99)
Potassium: 5.1 mmol/L (ref 3.5–5.2)
Sodium: 143 mmol/L (ref 134–144)
eGFR: 64 mL/min/{1.73_m2} (ref >59)

## 2023-08-31 SURGERY — CARDIOVERSION
Anesthesia: General

## 2023-08-31 MED ORDER — HYDROCORTISONE 1 % EX CREA: 1.0000 | TOPICAL_CREAM | Freq: Three times a day (TID) | CUTANEOUS | Status: DC | PRN

## 2023-08-31 MED ORDER — LIDOCAINE 2% (20 MG/ML) 5 ML SYRINGE
INTRAMUSCULAR | Status: DC | PRN
  Administered 2023-08-31: 60 mg via INTRAVENOUS

## 2023-08-31 MED ORDER — PROPOFOL 10 MG/ML IV BOLUS
INTRAVENOUS | Status: DC | PRN
Start: 2023-08-31 — End: 2023-08-31
  Administered 2023-08-31: 50 mg via INTRAVENOUS

## 2023-08-31 MED ORDER — SODIUM CHLORIDE 0.9 % IV SOLN
INTRAVENOUS | Status: DC
  Administered 2023-08-31: 20 mL/h via INTRAVENOUS

## 2023-08-31 SURGICAL SUPPLY — 1 items: PAD DEFIB RADIO PHYSIO CONN (PAD) ×1 IMPLANT

## 2023-08-31 NOTE — Transfer of Care (Signed)
Immediate Anesthesia Transfer of Care Note  Patient: [name]  Procedure(s) Performed: CARDIOVERSION  Patient Location: Cath Lab  Anesthesia Type:General  Level of Consciousness: drowsy and patient cooperative  Airway & Oxygen Therapy: Patient Spontanous Breathing and Patient connected to nasal cannula oxygen  Post-op Assessment: Report given to RN, Post -op Vital signs reviewed and stable, and Patient moving all extremities X 4  Post vital signs: Reviewed and stable  Last Vitals:  Vitals Value Taken Time  BP 143/69 08/31/23 0858  Temp    Pulse 55 08/31/23 0859  Resp 24 08/31/23 0859  SpO2 99 % 08/31/23 0859    Last Pain: There were no vitals filed for this visit.       Complications: No notable events documented.

## 2023-08-31 NOTE — Telephone Encounter (Signed)
Dru called back, just requesting pt's updated med list emailed to [email or web address]

## 2023-08-31 NOTE — Interval H&P Note (Signed)
Update   Labs from yesterday came through potassium within normal limits.      Latest Ref Rng & Units 08/30/2023    9:10 AM 08/28/2023   10:37 AM 07/06/2023    9:29 AM  BMP  Glucose 70 - 99 mg/dL 89  95  161   BUN 10 - 36 mg/dL 23  19  21    Creatinine 0.76 - 1.27 mg/dL 0.96  0.45  4.09   BUN/Creat Ratio 10 - 24 21  24     Sodium 134 - 144 mmol/L 143  139  139   Potassium 3.5 - 5.2 mmol/L 5.1  5.6  4.8   Chloride 96 - 106 mmol/L 103  99  102   CO2 20 - 29 mmol/L 25  27  29    Calcium 8.6 - 10.2 mg/dL 9.6  81.1  9.2    Tessa Lerner, DO, [facility]  [address] [place], [place] [address] [phone]

## 2023-08-31 NOTE — Discharge Instructions (Signed)
 Electrical Cardioversion Electrical cardioversion is the delivery of a jolt of electricity to restore a normal rhythm to the heart. A rhythm that is too fast or is not regular (arrhythmia) keeps the heart from pumping blood well. There is also another type of cardioversion called a chemical (pharmacologic) cardioversion. This is when your health care provider gives you one or more medicines to bring back your regular heart rhythm. Electrical cardioversion is done as a scheduled procedure for arrhythmiasthat are not life-threatening. Electrical cardioversion may also be done in an emergency for sudden life-threatening arrhythmias. Tell a health care provider about: Any allergies you have. All medicines you are taking, including vitamins, herbs, eye drops, creams, and over-the-counter medicines. Any problems you or family members have had with sedatives or anesthesia. Any bleeding problems you have. Any surgeries you have had, including a pacemaker, defibrillator, or other implanted device. Any medical conditions you have. Whether you are pregnant or may be pregnant. What are the risks? Your provider will talk with you about risks. These include: Allergic reactions to medicines. Irritation to the skin on your chest or back where the sticky pads (electrodes) or paddles were put during electrical cardioversion. A blood clot that breaks free and travels to other parts of your body, such as your brain. Return of a worse abnormal heart rhythm that will need to be treated with medicines, a pacemaker, or an implantable cardioverter defibrillator (ICD). What happens before the procedure? Medicines Your provider may give you: Blood-thinning medicines (anticoagulants) so your blood does not clot as easily. If your provider gives you this medicine, you may need to take it for 4 weeks before the procedure. Medicines to help stabilize your heart rate and rhythm. Ask your provider about: Changing or stopping  your regular medicines. These include any diabetes medicines or blood thinners you take. Taking medicines such as aspirin and ibuprofen. These medicines can thin your blood. Do not take them unless your provider tells you to. Taking over-the-counter medicines, vitamins, herbs, and supplements. General instructions Follow instructions from your provider about what you may eat and drink. Do not put any lotions, powders, or ointments on your chest and back for 24 hours before the procedure. They can cause problems with the electrodes or paddles used to deliver electricity to your heart. Do not wear jewelry as this can interfere with delivering electricity to your heart. If you will be going home right after the procedure, plan to have a responsible adult: Take you home from the hospital or clinic. You will not be allowed to drive. Care for you for the time you are told. Tests You may have an exam or testing. This may include: Blood labs. A transesophageal echocardiogram (TEE). What happens during the procedure?     An IV will be inserted into one of your veins. You will be given a sedative. This helps you relax. Electrodes or metal paddles will be placed on your chest. They may be placed in one of these ways: One placed on your right chest, the other on the left ribs. One placed on your chest and the other on your back. An electrical shock will be delivered. The shock briefly stops (resets) your heart rhythm. Your provider will check to see if your heart rhythm is now normal. Some people need only one shock. Some need more to restore a normal heart rhythm. The procedure may vary among providers and hospitals. What happens after the procedure? Your blood pressure, heart rate, breathing rate, and blood oxygen  level will be monitored until you leave the hospital or clinic. Your heart rhythm will be watched to make sure it does not change. This information is not intended to replace advice  given to you by your health care provider. Make sure you discuss any questions you have with your health care provider. Document Revised: 07/06/2022 Document Reviewed: 07/06/2022 Elsevier Patient Education  2024 [employer].

## 2023-08-31 NOTE — CV Procedure (Signed)
   DIRECT CURRENT CARDIOVERSION  NAME:  [name]    MRN: [id number] DOB:  05-31-1930    ADMIT DATE: 08/31/2023  Indication:  Symptomatic atrial fibrillation   Procedure Note:  The patient signed informed consent.  They have had had therapeutic anticoagulation with Eliquis greater than 3 weeks.  Anesthesia was administered by Dr. Renold Don.  Adequate airway was maintained throughout and vital followed per protocol.  They were cardioverted x 1 with 150J of biphasic synchronized energy.  They converted to sinus bradycardia.  There were no apparent complications.  The patient had normal neuro status and respiratory status post procedure with vitals stable as recorded elsewhere.    Follow up: They will continue on current medical therapy and follow up with cardiology as scheduled.  Update: Prior to procedure he was asked who needs to be updated of the procedure outcome. He said "no one."   Tessa Lerner, DO, [facility]  [address] [place], [place] [address] [phone] 9:02 AM

## 2023-08-31 NOTE — Interval H&P Note (Signed)
History and Physical Interval Note:  08/31/2023 8:03 AM  [name]  has presented today for surgery, with the diagnosis of AFIB.  The various methods of treatment have been discussed with the patient and family. After consideration of risks, benefits and other options for treatment, the patient has consented to  Procedure(s): CARDIOVERSION (N/A) as a surgical intervention.  The patient's history has been reviewed, patient examined, no change in status, stable for surgery.  I have reviewed the patient's chart and labs.  Questions were answered to the patient's satisfaction.    Risks, benefits, and alternatives of direct current cardioversion reviewed with the and patient. Risk includes but not limited to: potential for post-cardioversion rhythms, life-threatening arrhythmias (ventricular tachycardia and fibrillation, profound bradycardia, cardiac arrest), myocardial damage, acute pulmonary edema, skin burns, transient hypotension. Benefits include restoration of sinus rhythm. Alternatives to treatment were discussed, questions were answered, patient voices understanding and provides verbal feedback.  Patient is willing to proceed.   Will recheck electrolytes to check potassium levels.   Has been taking Eliquis without fail over the last 4 weeks.   Tessa Lerner, DO, [facility] [place]  [facility]  [address] [place], [place] [address] [phone]

## 2023-08-31 NOTE — Telephone Encounter (Signed)
Received FL2 form via fax from [facility], Pt had cardiac procedure and will be d/c to their ALF- needing PCP to complete FL2 and fax back to [phone].  [facility] [phone])- informing PCP has been out of office since 08/20/23, he will not return to office until 10/7. Informed that cardiology maybe able to complete FL2 form for them if they want to reach out directly to their office.

## 2023-08-31 NOTE — Telephone Encounter (Signed)
Med list emailed.

## 2023-08-31 NOTE — Anesthesia Postprocedure Evaluation (Signed)
Anesthesia Post Note  Patient: [name]  Procedure(s) Performed: CARDIOVERSION     Patient location during evaluation: PACU Anesthesia Type: General Level of consciousness: sedated and patient cooperative Pain management: pain level controlled Vital Signs Assessment: post-procedure vital signs reviewed and stable Respiratory status: spontaneous breathing Cardiovascular status: stable Anesthetic complications: no   No notable events documented.  Last Vitals:  Vitals:   08/31/23 0910 08/31/23 0915  BP: 127/63 (!) 131/54  Pulse: 64 73  Resp: (!) 22 12  Temp:  (!) 36.3 C  SpO2: 96% 93%    Last Pain:  Vitals:   08/31/23 0915  TempSrc: Temporal  PainSc: 0-No pain                 Lewie Loron

## 2023-09-03 ENCOUNTER — Encounter (HOSPITAL_COMMUNITY): Payer: Self-pay | Admitting: Cardiology

## 2023-09-13 NOTE — Progress Notes (Signed)
Cardiology Office Note:    Date:  09/14/2023  ID:  Ezequiel Kayser, DOB 1930-09-03, MRN [id number] PCP: Wanda Plump, MD  [facility] Providers Cardiologist:  Charlton Haws, MD       Patient Profile:      Coronary artery disease  S/p CABG in 2011 Myoview 07/29/21: EF 57, normal perfusion, low risk Paroxysmal atrial fibrillation Dronedarone DC'd secondary to GI side effects; Not on beta-blocker due to bradycardia Anticoagulation: Apixaban Monitor 07/2021: NSR, ATach longest 13 beats Monitor 06/2023: 100% AFib>>Amiodarone S/p DCCV 08/2023  TTE 08/30/15: EF 55-60, no RWMA, GR 1 DD, trivial AI, mild RAE  TTE 07/27/23: EF 60-65, no RWMA, mild LVH, NL RVSF, mod BAE, mild MR, mild AI, asc Aorta 40 mm, RAP 3, AV calcified but no AS by doppler Hyperlipidemia Ascending aorta dilation  CT 05/18/22: ascending aorta ectasia, 3.8 cm  TTE 06/2023: 40 mm  Osteoporosis Elevated PSA GERD           History of Present Illness:  Discussed the use of AI scribe software for clinical note transcription with the patient, who gave verbal consent to proceed.  [name] is a 89 y.o. male who returns for follow up of AFib after recent DCCV. He was last seen 08/28/23 and set up for DCCV. He had a successful DCCV 08/31/23 with return of NSR.   He is here alone. The patient reports feeling better since the cardioversion, with no symptoms of dizziness. However, he did mention experiencing 'brain fog' and cold sensitivity, which he attributed to his thin physique and blood thinner medication. The patient also reported arthritis in the neck, causing discomfort and sleep disturbances. He notes concerns about potential memory issues related to Crestor use. 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  He has not had chest pain, syncope, shortness of breath.      Review of Systems  Gastrointestinal:  Negative for hematochezia and melena.  Genitourinary:  Negative for hematuria.  See  HPI     Studies Reviewed:   EKG Interpretation Date/Time:  Friday September 14 2023 09:51:49 EDT Ventricular Rate:  52 PR Interval:  224 QRS Duration:  112 QT Interval:  448 QTC Calculation: 416 R Axis:   38  Text Interpretation: Sinus bradycardia with 1st degree A-V block with Premature atrial complexes Right bundle branch block No significant change since last tracing Confirmed by Tereso Newcomer [phone]) on 09/14/2023 10:08:22 AM    Results   LABS - Chart Review LDL: 27 (05/22/2023) Hemoglobin: 14.2 (05/22/2023) Creatinine: 1.1 (08/30/2023) Potassium: 5.1 (08/30/2023)      Risk Assessment/Calculations:    CHA2DS2-VASc Score = 3   This indicates a 3.2% annual risk of stroke. The patient's score is based upon: CHF History: 0 HTN History: 0 Diabetes History: 0 Stroke History: 0 Vascular Disease History: 1 Age Score: 2 Gender Score: 0            Physical Exam:   VS:  BP (!) 110/43   Pulse (!) 52   Ht 6\' 2"  (1.88 m)   Wt 153 lb 6.4 oz (69.6 kg)   SpO2 93%   BMI 19.70 kg/m    Wt Readings from Last 3 Encounters:  09/14/23 153 lb 6.4 oz (69.6 kg)  08/31/23 153 lb (69.4 kg)  08/28/23 153 lb 12.8 oz (69.8 kg)    Constitutional:      Appearance: Healthy appearance. Not in distress.  Neck:  Vascular: JVD normal.  Pulmonary:     Breath sounds: Normal breath sounds. No wheezing. No rales.  Cardiovascular:     Normal rate. Regularly irregular rhythm. Normal S1. Normal S2.      Murmurs: There is a grade 2/6 systolic murmur at the URSB and ULSB.  Edema:    Peripheral edema absent.  Abdominal:     Palpations: Abdomen is soft.        Assessment and Plan:   Assessment & Plan Persistent atrial fibrillation (HCC) He is maintaining sinus rhythm after recent cardioversion on Amiodarone without symptoms of dizziness or lightheadedness.  -Continue Amiodarone 200mg  daily and Eliquis 5mg  twice daily.  -CMET and TSH in 3 months.  -Considering his bradycardia and advanced  age, we may decrease Amiodarone at future visits. Coronary artery disease involving coronary bypass graft of native heart without angina pectoris He is status post CABG in 2011. Low risk stress test in 2022. He has not had symptoms suggestive of angina. We will continue current management. Mixed hyperlipidemia His LDL is optimal as of June 2024, but he reports issues with brain fog. We will hold Rosuvastatin for 2 weeks to assess for improvement in brain fog. If there is no improvement, Rosuvastatin 10mg  daily will be resumed. If there is improvement, we will consider Ezetimibe or referral to a lipid clinic for a PCSK9 inhibitor. Ascending aorta dilation [facility]) An echocardiogram in August 2024 showed a 40mm dilation. We will continue surveillance with echocardiograms every 1-2 years.          Dispo:  Return in about 6 months (around 03/14/2024) for Routine Follow Up, w/ Dr. Eden Emms.  Signed, Tereso Newcomer, PA-C

## 2023-09-14 ENCOUNTER — Ambulatory Visit: Payer: Medicare Other | Attending: Physician Assistant | Admitting: Physician Assistant

## 2023-09-14 ENCOUNTER — Encounter: Payer: Self-pay | Admitting: Physician Assistant

## 2023-09-14 VITALS — BP 110/43 | HR 52 | Ht 74.0 in | Wt 153.4 lb

## 2023-09-14 DIAGNOSIS — E782 Mixed hyperlipidemia: Secondary | ICD-10-CM | POA: Diagnosis not present

## 2023-09-14 DIAGNOSIS — I25810 Atherosclerosis of coronary artery bypass graft(s) without angina pectoris: Secondary | ICD-10-CM | POA: Diagnosis not present

## 2023-09-14 DIAGNOSIS — I77810 Thoracic aortic ectasia: Secondary | ICD-10-CM

## 2023-09-14 DIAGNOSIS — I4819 Other persistent atrial fibrillation: Secondary | ICD-10-CM

## 2023-09-14 NOTE — Patient Instructions (Signed)
Medication Instructions:  Your physician recommends that you continue on your current medications as directed. Please refer to the Current Medication list given to you today.  Hold your Rosuvastatin for 2 weeks, if you feel better off of it, let us know.  If you don't, then resume it.  *If you need a refill on your cardiac medications before your next appointment, please call your pharmacy*   Lab Work: WHEN YOU GO SEE YOUR PCP:  GO TO [facility] AND GET CMET & TSH If you have labs (blood work) drawn today and your tests are completely normal, you will receive your results only by: MyChart Message (if you have MyChart) OR A paper copy in the mail If you have any lab test that is abnormal or we need to change your treatment, we will call you to review the results.   Testing/Procedures:  None ordered   Follow-Up: At [facility], you and your health needs are our priority.  As part of our continuing mission to provide you with exceptional heart care, we have created designated Provider Care Teams.  These Care Teams include your primary Cardiologist (physician) and Advanced Practice Providers (APPs -  Physician Assistants and Nurse Practitioners) who all work together to provide you with the care you need, when you need it.  We recommend signing up for the patient portal called "MyChart".  Sign up information is provided on this After Visit Summary.  MyChart is used to connect with patients for Virtual Visits (Telemedicine).  Patients are able to view lab/test results, encounter notes, upcoming appointments, etc.  Non-urgent messages can be sent to your provider as well.   To learn more about what you can do with MyChart, go to [email or web address].    Your next appointment:   6 month(s)  Provider:   Charlton Haws, MD     Other Instructions

## 2023-09-14 NOTE — Assessment & Plan Note (Signed)
An echocardiogram in August 2024 showed a 40mm dilation. We will continue surveillance with echocardiograms every 1-2 years.

## 2023-09-14 NOTE — Assessment & Plan Note (Signed)
He is maintaining sinus rhythm after recent cardioversion on Amiodarone without symptoms of dizziness or lightheadedness.  -Continue Amiodarone 200mg  daily and Eliquis 5mg  twice daily.  -CMET and TSH in 3 months.  -Considering his bradycardia and advanced age, we may decrease Amiodarone at future visits.

## 2023-09-14 NOTE — Assessment & Plan Note (Signed)
His LDL is optimal as of June 2024, but he reports issues with brain fog. We will hold Rosuvastatin for 2 weeks to assess for improvement in brain fog. If there is no improvement, Rosuvastatin 10mg  daily will be resumed. If there is improvement, we will consider Ezetimibe or referral to a lipid clinic for a PCSK9 inhibitor.

## 2023-09-14 NOTE — Assessment & Plan Note (Signed)
He is status post CABG in 2011. Low risk stress test in 2022. He has not had symptoms suggestive of angina. We will continue current management.

## 2023-09-25 NOTE — Telephone Encounter (Signed)
Prolia VOB initiated via [email or web address]  Next Prolia inj DUE: 10/26/23

## 2023-09-26 DIAGNOSIS — M542 Cervicalgia: Secondary | ICD-10-CM | POA: Diagnosis not present

## 2023-10-02 DIAGNOSIS — M542 Cervicalgia: Secondary | ICD-10-CM | POA: Diagnosis not present

## 2023-10-08 ENCOUNTER — Other Ambulatory Visit: Payer: Self-pay | Admitting: Cardiovascular Disease

## 2023-10-12 DIAGNOSIS — M542 Cervicalgia: Secondary | ICD-10-CM | POA: Diagnosis not present

## 2023-10-17 NOTE — Telephone Encounter (Signed)
Prior Auth: APPROVED PA# [id number] Valid: 01/04/23-01/05/24

## 2023-10-20 NOTE — Telephone Encounter (Signed)
Pt ready for scheduling on or after 10/26/23  Out-of-pocket cost due at time of visit: $346  Primary: UHC Medicare Adv HMO POS Prolia co-insurance: 20% (approximately $320.99) Admin fee co-insurance: 20% (approximately $25)  Deductible: does not apply  Prior Auth: APPROVED PA# [id number] Valid: 01/04/23-01/05/24  Secondary: N/A Prolia co-insurance:  Admin fee co-insurance:  Deductible:  Prior Auth:  PA# Valid:   ** This summary of benefits is an estimation of the patient's out-of-pocket cost. Exact cost may vary based on individual plan coverage.

## 2023-10-20 NOTE — Telephone Encounter (Signed)
Prior Auth valid and on file  Prior Auth: APPROVED PA# [id number] Valid: 01/04/23-01/05/24

## 2023-10-20 NOTE — Telephone Encounter (Signed)
Prior Auth REQUIRED for PROLIA   PA PROCESS DETAILS: Please complete the prior authorization form located at  [email or web address] or call [phone].   Primary UHC Medicare Adv HMO POS  COVERAGE DETAILS: Prolia will be subject to 20.0% coinsurance up to a $3,600.00 out of pocket max  ($1,227.76 met). Once met, coverage increases to 100%. Administration is subject to $20.00 copay, which  does contribute to OOP max. No deductible applies. The benefits provided on this Verification of Benefits  form are Medical Benefits and are the patient's In-Network benefits for Prolia. If you would like Pharmacy  Benefits for Prolia, please call [phone].

## 2023-10-30 ENCOUNTER — Ambulatory Visit: Payer: Medicare Other

## 2023-10-30 DIAGNOSIS — M810 Age-related osteoporosis without current pathological fracture: Secondary | ICD-10-CM

## 2023-10-30 MED ORDER — DENOSUMAB 60 MG/ML ~~LOC~~ SOSY
60.0000 mg | PREFILLED_SYRINGE | Freq: Once | SUBCUTANEOUS | Status: AC
Start: 2024-04-29 — End: 2024-05-02
  Administered 2024-05-02: 60 mg via SUBCUTANEOUS

## 2023-10-30 MED ORDER — DENOSUMAB 60 MG/ML ~~LOC~~ SOSY
60.0000 mg | PREFILLED_SYRINGE | Freq: Once | SUBCUTANEOUS | Status: AC
Start: 2023-10-30 — End: 2023-10-30
  Administered 2023-10-30: 60 mg via SUBCUTANEOUS

## 2023-10-30 NOTE — Progress Notes (Signed)
After obtaining consent, and per orders of Dr. Elvera Lennox, injection of Prolia given by Pollie Meyer. Patient instructed to remain in clinic for 20 minutes afterwards, and to report any adverse reaction to me immediately.

## 2023-11-02 DIAGNOSIS — M47812 Spondylosis without myelopathy or radiculopathy, cervical region: Secondary | ICD-10-CM | POA: Diagnosis not present

## 2023-11-02 DIAGNOSIS — M5412 Radiculopathy, cervical region: Secondary | ICD-10-CM | POA: Diagnosis not present

## 2023-11-05 NOTE — Telephone Encounter (Signed)
Last Prolia inj 10/30/23 Next Prolia inj due 04/30/24

## 2023-12-03 ENCOUNTER — Ambulatory Visit (INDEPENDENT_AMBULATORY_CARE_PROVIDER_SITE_OTHER): Payer: Medicare Other | Admitting: Internal Medicine

## 2023-12-03 ENCOUNTER — Encounter: Payer: Self-pay | Admitting: Internal Medicine

## 2023-12-03 VITALS — BP 122/60 | HR 62 | Temp 98.1°F | Resp 16 | Ht 74.0 in | Wt 155.5 lb

## 2023-12-03 DIAGNOSIS — M1712 Unilateral primary osteoarthritis, left knee: Secondary | ICD-10-CM

## 2023-12-03 DIAGNOSIS — I480 Paroxysmal atrial fibrillation: Secondary | ICD-10-CM

## 2023-12-03 DIAGNOSIS — I25810 Atherosclerosis of coronary artery bypass graft(s) without angina pectoris: Secondary | ICD-10-CM

## 2023-12-03 NOTE — Assessment & Plan Note (Signed)
 DJD flareup: Developed pain on the left knee after walking 1 mile yesterday.  Exam is benign.  We agree on resting until he feels better, continue Tylenol , knee sleeve, icing.  Will call if not better.  Consider PT versus referral for possible injection. Ok to go back to exercise once better, avoid long walks  A-fib, CAD. Cardioversion 08/31/2023, saw cardiology 09/14/2023, he was maintaining sinus rhythm.  No changes were recommended.  He feels more energetic since he is back on NSR Hyperlipidemia: Cardiology recommended to hold rosuvastatin  temporarily due to brain fog. Vaccines: Already had a flu shot and a COVID-vaccine RTC C 3 to 4 months

## 2023-12-03 NOTE — Progress Notes (Signed)
 Subjective:    Patient ID: [name], male    DOB: [birth year], 89 y.o.   MRN: [id number]  DOS:  12/03/2023 Type of visit - description: Acute visit.  Yesterday, he decided to exercise and walk a mile in a indoor track. Subsequently developed left knee pain. Denies any falls.  The knee is not swollen or red.  Also, saw cardiology, note reviewed. Vaccines reviewed.   Review of Systems See above   Past Medical History:  Diagnosis Date   Anxiety    Atrial fibrillation (HCC)    Blood transfusion without reported diagnosis    CAD    a. 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 // Myoview  9/22: EF 57, normal perfusion; low risk   Cataract    Chronic abdominal pain    Chronic nausea    Coronary artery disease    Diverticulosis 2011   Diverticulitis 2004   Dyspepsia    Esophageal reflux    GERD (gastroesophageal reflux disease)    Headache    saw neuro 4-16, MRI done   History of echocardiogram    Echo 9/22: EF 60-65, no RWMA, normal diastolic function, GLS -24.9, normal RVSF, normal PASP, mild-moderate BAE, trivial MR, moderate MAC, moderate AV calcification, mild AI, mild aortic stenosis (mean gradient 5 mmHg, V-max 158 cm/s, DI 0.62), mild dilation of ascending aorta (40 mm)   HYPERPLASIA, PROSTATE NOS W/URINARY OBST/LUTS    IBS    INGUINAL HERNIA    Lichen planus 2006   Niels Hoover MD   LUMBAR RADICULOPATHY, RIGHT    Mixed hyperlipidemia    OSTEOPOROSIS    PONV (postoperative nausea and vomiting)    PREMATURE VENTRICULAR CONTRACTIONS    a. After CABG - was on Coumadin/Multaq  for a period of time. Coumadin discontinued 04/2010 after maintaining NSR.   UNSPECIFIED ANEMIA     Past Surgical History:  Procedure Laterality Date   BIOPSY  09/10/2018   Procedure: BIOPSY;  Surgeon: Albertus Gordy HERO, MD;  Location: [facility];  Service: Gastroenterology;;   CARDIOVERSION N/A 08/31/2023   Procedure:  CARDIOVERSION;  Surgeon: Michele Richardson, DO;  Location: [facility];  Service: Cardiovascular;  Laterality: N/A;   CATARACT EXTRACTION Right 10/06/2013   CORONARY ARTERY BYPASS GRAFT     2 VD;anomalous RCA;post op compliacted by AF   ESOPHAGOGASTRODUODENOSCOPY (EGD) WITH PROPOFOL  N/A 09/10/2018   Procedure: ESOPHAGOGASTRODUODENOSCOPY (EGD) WITH PROPOFOL ;  Surgeon: Albertus Gordy HERO, MD;  Location: [facility];  Service: Gastroenterology;  Laterality: N/A;   HIP FRACTURE SURGERY Right 03/2007   Dr. Rollie   INGUINAL HERNIA REPAIR Left 2012   Dr Vanderbilt   KNEE SURGERY Right 1977   repair   PROSTATE BIOPSY  09/19/2013   [facility]    Current Outpatient Medications  Medication Instructions   acetaminophen  (TYLENOL ) 325-650 mg, Every 8 hours PRN   amiodarone  (PACERONE ) 200 mg, Oral, Daily   apixaban  (ELIQUIS ) 5 mg, Oral, 2 times daily   bisacodyl (BISACODYL) 5 mg, 3 times daily   calcium  carbonate (TUMS - DOSED IN MG ELEMENTAL CALCIUM ) 500 mg, Daily   denosumab  (PROLIA ) 60 mg, Every 6 months   famotidine -calcium  carbonate-magnesium hydroxide (PEPCID  COMPLETE) 10-800-165 MG chewable tablet 1 tablet, Daily PRN   folic acid  (FOLVITE ) 1 mg, Oral, Daily, Please call [phone] to schedule an appointment for August for future refills. Thank you.   Glycerin-Hypromellose-PEG 400 (DRY EYE RELIEF DROPS)  0.2-0.2-1 % SOLN 1 drop, Every morning   Multiple Vitamin (MULITIVITAMIN WITH MINERALS) TABS 1 tablet, Daily   nitroGLYCERIN  (NITROSTAT ) 0.4 MG SL tablet DISSOLVE 1 TABLET UNDER TONGUE EVERY 5 MINUTES FOR UP TO 3 DOSES AS NEEDED FOR CHEST PAIN   rosuvastatin  (CRESTOR ) 10 mg, Oral, Daily   Vitamin D -3 1,000 Units, Daily with breakfast       Objective:   Physical Exam BP 122/60   Pulse 62   Temp 98.1 F (36.7 C) (Oral)   Resp 16   Ht 6' 2 (1.88 m)   Wt 155 lb 8 oz (70.5 kg)   SpO2 95%   BMI 19.97 kg/m  General:   Well developed, NAD, BMI noted. HEENT:  Normocephalic . Face  symmetric, atraumatic Lungs:  CTA B Normal respiratory effort, no intercostal retractions, no accessory muscle use. Heart: RRR, trace systolic murmur L from the sternum  Lower extremities: Bony changes consistent with DJD in both knees.  No effusion, no redness.  ROM symmetric. Skin: Not pale. Not jaundice Neurologic:  alert & oriented X3.  Speech normal, gait assisted by a walker. Psych--  Cognition and judgment appear intact.  Cooperative with normal attention span and concentration.  Behavior appropriate. No anxious or depressed appearing.      Assessment     Assessment Prediabetes, A1c 6.0 (2015) Hyperlipidemia Anxiety, depression:  -intol to zoloft , Rx lexapro  5 mg 07-2015: intolerant d/t nausea  -lost wife July 2017 CV: ---CAD--CABG 2011 ----P- Atrial fibrillation, PVCs.  Elliquis ; Multaq  d/c d/t GI s/e ----Palpable Ao x years, US  (-) for AAA 2005 COPD (per CXR 06-2015), PFTs 10-04-15 mild obstruction DJD  Gait-- uses a walker prn GI: --GERD, IBS, diverticulosis --Chronic nausea: 2019.  Work-up: 08-2018: Negative EGD, CT  and US  abdomen. 12/2018 wnl Gastric empty stomach.  Symptoms decreased after Multaq  stopped GU: elevated PSA, BPH , (-) bx 2014 Osteoporosis:   (per ENDO) -hip FX 2008 - dexa ~2005 --> rx fosamax , took x 1 year, dexa ~2010 was rec no fosamax  at that time. Dexa 09-2015: T score -2.4 --> rx fosamax , d/c d/t jaw pain 06-2017; took prolia  #25 February 2018; T score (-) 30 December 2018  Lichen planusHeadache -- saw neurology 02-2015, had a MRI/MRA (-) COPD: PFTs 08-2020: Mild obstruction with some air trapping.  Insignificant response to bronchodilators.   PLAN DJD flareup: Developed pain on the left knee after walking 1 mile yesterday.  Exam is benign.  We agree on resting until he feels better, continue Tylenol , knee sleeve, icing.  Will call if not better.  Consider PT versus referral for possible injection. Ok to go back to exercise once better, avoid long  walks  A-fib, CAD. Cardioversion 08/31/2023, saw cardiology 09/14/2023, he was maintaining sinus rhythm.  No changes were recommended.  He feels more energetic since he is back on NSR Hyperlipidemia: Cardiology recommended to hold rosuvastatin  temporarily due to brain fog. Vaccines: Already had a flu shot and a COVID-vaccine RTC C 3 to 4 months

## 2023-12-03 NOTE — Patient Instructions (Addendum)
 Tylenol   500 mg OTC 2 tabs a day every 8 hours as needed for pain  Continue using knee sleeve  Apply ice to your left knee twice daily  Continue using your walker  Restart your walking program once you feel better.  Start slow, 5 to 10 minutes daily.  Call if not gradually better    Next visit with me in 3 to 4 months.  Please schedule it at the front desk

## 2023-12-20 ENCOUNTER — Encounter: Payer: Self-pay | Admitting: Internal Medicine

## 2023-12-20 ENCOUNTER — Ambulatory Visit: Payer: Medicare Other | Admitting: Internal Medicine

## 2023-12-20 VITALS — BP 124/70 | HR 73 | Ht 74.0 in | Wt 153.6 lb

## 2023-12-20 DIAGNOSIS — M810 Age-related osteoporosis without current pathological fracture: Secondary | ICD-10-CM | POA: Diagnosis not present

## 2023-12-20 DIAGNOSIS — Z87898 Personal history of other specified conditions: Secondary | ICD-10-CM | POA: Diagnosis not present

## 2023-12-20 NOTE — Progress Notes (Addendum)
Patient ID: [name], male   DOB: January 10, 1930, 89 y.o.   MRN: [id number]   HPI  [name] is a 89 y.o.-year-old pleasant male, initially referred by his PCP, Dr. Drue Novel, returning for follow-up osteoporosis.  Last visit 1 year ago.  Interim history: No falls or fractures since last visit. No vertigo, disequilibrium, orthostasis.  He has a little bit of unsteadiness on his feet, which is not new. He had atrial fibrillation.  He had cardioversion 08/2023 >> this worked well for him. He had significant cervical spondylosis.  Since last visit, he had an MRI: bulged disk.he was offered a steroid injection, but did not have it.  The pain resolved after he since then.  He continues to go to [employer] (Dr. Yisroel Ramming).   Reviewed history: He was diagnosed with osteoporosis in 2008  I first saw the patient in 2020 and at that time I suggested either Tymlos/Forteo or Romosozumab Secretary/administrator).   He decided against these medications >>  restarted Prolia. He felt better after starting this, with less joint pain and more energy.  Reviewed previous DXA scan reports: 02/12/2023([place])  Lumbar spine L1-L4 (L3) Femoral neck (FN)  T-score -1.1 LFN: -2.3  Change in BMD from previous DXA test (%) -2.1% -4.6%  (*) statistically significant  01/31/2021 ([facility]) Lumbar spine L1-L4 (L3) Femoral neck (FN)  T-score -0.9 LFN: -2.0  Change in BMD from previous DXA test (%) + 2.5% +11.1%  (*) statistically significant  01/22/2019 ([place]) Lumbar spine L1-L4 Femoral neck (FN)  T-score -2.2 LFN: -3.0  Change in BMD from previous DXA test (%) +1.4% -8.0%*  (*) statistically significant  Date L1-L4 T score FN T score  10/14/2015 ([place]) -2.3 LFN: -2.6  10/04/2007 -2.5 RFN: -2.8 LFN: -2.3   Reviewed previous fractures: - vertebral compression fractures per review of the abd. CT report from 08/16/2018: moderate T8, mild T9, T10, moderate T12 compression fractures. He feels they developed after lifting  heavy objects. - Right hip fracture 03/2008: "There is a spiral fracture of the proximal aspect of the right femur.  This begins in the intertrochanteric region and extends into the proximal femur.  There is a avulsion of the lesser trochanter.  The femoral head is located within the acetabulum. Degenerative changes are noted in the lower spine.  Bones appear Osteopenic".  Osteopenia was detected on x ray of sacrum/coccyx 01/14/2016.  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  After last visit I gave him a prescription for physical therapy (03/2020) at [facility] rehab. He had PT after Covid19 >> now exercises by himself. He uses the stepper, strength machines; also balance and mobility class.  He does not take high vitamin A doses.  He has a family history of osteoporosis in one of his sisters and possibly also in his mother.  No consistent history of hyper or hypocalcemia or hyperparathyroidism.  No history of kidney stones. Lab Results  Component Value Date   CALCIUM 9.6 08/30/2023   CALCIUM 10.0 08/28/2023   CALCIUM 9.2 07/06/2023   CALCIUM 10.5 05/22/2023   CALCIUM 10.2 11/22/2022   CALCIUM 9.2 07/25/2022   CALCIUM  8.4 (L) 06/22/2022   CALCIUM 9.6 05/23/2022   CALCIUM 9.9 05/03/2022   CALCIUM 10.1 12/19/2021   No history of thyrotoxicosis: Lab Results  Component Value Date   TSH 3.370 07/09/2023   TSH 2.66 05/03/2022   TSH 3.31 10/19/2021   TSH 2.44 08/14/2018   TSH 2.61 02/19/2018   No CKD. Last BUN/Cr: Lab Results  Component Value Date   BUN 23 08/30/2023   CREATININE 1.10 08/30/2023   H/o CABG 2011.  He also has a history of A. fib. He had palpitations in 07/2021 - PVCs.He had extensive cardiac investigation >> negative. Covid 19 06/2022 >> nausea >> lost 15 lbsin 1 year. He gained them back.  ROS: + Joint aches  I reviewed pt's medications, allergies, PMH, social hx, family hx, and changes were documented in the history of present illness. Otherwise, unchanged from my initial visit note.  Past Medical History:  Diagnosis Date   Anxiety    Atrial fibrillation (HCC)    Blood transfusion without reported diagnosis    CAD    a. 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 // Myoview 9/22: EF 57, normal perfusion; low risk   Cataract    Chronic abdominal pain    Chronic nausea    Coronary artery disease    Diverticulosis 2011   Diverticulitis 2004   Dyspepsia    Esophageal reflux    GERD (gastroesophageal reflux disease)    Headache    saw neuro 4-16, MRI done   History of echocardiogram    Echo 9/22: EF 60-65, no RWMA, normal diastolic function, GLS -24.9, normal RVSF, normal PASP, mild-moderate BAE, trivial MR, moderate MAC, moderate AV calcification, mild AI, mild aortic stenosis (mean gradient 5 mmHg, V-max 158 cm/s, DI 0.62), mild dilation of ascending aorta (40 mm)   HYPERPLASIA, PROSTATE NOS W/URINARY OBST/LUTS    IBS    INGUINAL HERNIA    Lichen planus 2006   Francesca Oman MD   LUMBAR RADICULOPATHY, RIGHT    Mixed hyperlipidemia    OSTEOPOROSIS    PONV (postoperative nausea and vomiting)     PREMATURE VENTRICULAR CONTRACTIONS    a. After CABG - was on Coumadin/Multaq for a period of time. Coumadin discontinued 04/2010 after maintaining NSR.   UNSPECIFIED ANEMIA    Past Surgical History:  Procedure Laterality Date   BIOPSY  09/10/2018   Procedure: BIOPSY;  Surgeon: Beverley Fiedler, MD;  Location: [facility];  Service: Gastroenterology;;   CARDIOVERSION N/A 08/31/2023   Procedure: CARDIOVERSION;  Surgeon: Tessa Lerner, DO;  Location: [facility];  Service: Cardiovascular;  Laterality: N/A;   CATARACT EXTRACTION Right 10/06/2013   CORONARY ARTERY BYPASS GRAFT     2 VD;anomalous RCA;post op compliacted by AF   ESOPHAGOGASTRODUODENOSCOPY (EGD) WITH PROPOFOL N/A 09/10/2018   Procedure: ESOPHAGOGASTRODUODENOSCOPY (EGD) WITH PROPOFOL;  Surgeon: Beverley Fiedler, MD;  Location: [facility];  Service: Gastroenterology;  Laterality: N/A;   HIP FRACTURE SURGERY Right 03/2007   Dr.  [facility]   INGUINAL HERNIA REPAIR Left 2012   Dr Luisa Hart   KNEE SURGERY Right 1977   repair   PROSTATE BIOPSY  09/19/2013   [facility]   Social History   Socioeconomic History   Marital status: Widowed    Spouse name: Not on file   Number of children: 0   Years of education: Not on file   Highest education level: Not on file  Occupational History   Occupation: retired    Associate Professor: RETIRED  Tobacco Use   Smoking status: Former    Current packs/day: 0.00    Types: Cigarettes    Quit date: 11/27/1962    Years since quitting: 61.1   Smokeless tobacco: Never  Vaping Use   Vaping status: Never Used  Substance and Sexual Activity   Alcohol use: Not Currently    Alcohol/week: 0.0 standard drinks of alcohol    Comment: quit drinking    Drug use: No   Sexual activity: Not Currently  Other Topics Concern   Not on file  Social History Narrative   Lives at [facility] in independent living.     Lost wife 06-2016.     Has no children.  Family: nephews-nices in [place]   Retired from [employer].     Still drives.    Social Drivers of Corporate investment banker Strain: Low Risk  (07/25/2023)   Overall Financial Resource Strain (CARDIA)    Difficulty of Paying Living Expenses: Not hard at all  Food Insecurity: No Food Insecurity (07/25/2023)   Hunger Vital Sign    Worried About Running Out of Food in the Last Year: Never true    Ran Out of Food in the Last Year: Never true  Transportation Needs: No Transportation Needs (07/25/2023)   PRAPARE - Administrator, Civil Service (Medical): No    Lack of Transportation (Non-Medical): No  Physical Activity: Sufficiently Active (07/25/2023)   Exercise Vital Sign    Days of Exercise per Week: 7 days    Minutes of Exercise per Session: 60 min  Stress: No Stress Concern Present (07/25/2023)   [employer] of Occupational Health - Occupational Stress Questionnaire    Feeling of Stress : Not at all  Social Connections: Unknown (07/25/2023)   Social Connection and Isolation Panel [NHANES]    Frequency of Communication with Friends and Family: Once a week    Frequency of Social Gatherings with Friends and Family: Twice a week    Attends Religious Services: Not on Marketing executive or Organizations: Yes    Attends Banker Meetings: More than 4 times per year    Marital Status: Widowed  Intimate Partner Violence: Not At Risk (08/01/2023)   Humiliation, Afraid, Rape, and Kick questionnaire    Fear of Current or Ex-Partner: No    Emotionally Abused: No    Physically Abused: No    Sexually Abused: No   Current Outpatient Medications on File Prior to Visit  Medication Sig Dispense Refill   acetaminophen (TYLENOL) 650 MG CR tablet Take 325-650 mg by mouth every 8 (eight) hours as needed for pain (Arthritis).     amiodarone (PACERONE) 200 MG tablet Take 1 tablet (200 mg total) by mouth daily. 90 tablet 3   apixaban (ELIQUIS) 5 MG TABS tablet Take 1 tablet (5 mg total) by mouth 2 (two) times daily.  60 tablet 5   bisacodyl 5 MG EC tablet Take 5 mg  by mouth 3 (three) times daily. Dulcolax soft gel     calcium carbonate (TUMS - DOSED IN MG ELEMENTAL CALCIUM) 500 MG chewable tablet Chew 500 mg by mouth daily.     Cholecalciferol (VITAMIN D-3) 25 MCG (1000 UT) CAPS Take 1,000 Units by mouth daily with breakfast.     denosumab (PROLIA) 60 MG/ML SOSY injection Inject 60 mg into the skin every 6 (six) months.     famotidine-calcium carbonate-magnesium hydroxide (PEPCID COMPLETE) 10-800-165 MG chewable tablet Chew 1 tablet by mouth daily as needed (indigestion).     folic acid (FOLVITE) 1 MG tablet Take 1 tablet (1 mg total) by mouth daily. Please call [phone] to schedule an appointment for August for future refills. Thank you. 90 tablet 1   Glycerin-Hypromellose-PEG 400 (DRY EYE RELIEF DROPS) 0.2-0.2-1 % SOLN Place 1 drop into both eyes every morning. Lubricating eyes     Multiple Vitamin (MULITIVITAMIN WITH MINERALS) TABS Take 1 tablet by mouth daily.     nitroGLYCERIN (NITROSTAT) 0.4 MG SL tablet DISSOLVE 1 TABLET UNDER TONGUE EVERY 5 MINUTES FOR UP TO 3 DOSES AS NEEDED FOR CHEST PAIN (Patient not taking: Reported on 12/03/2023) 25 tablet 9   rosuvastatin (CRESTOR) 10 MG tablet Take 1 tablet (10 mg total) by mouth daily. 90 tablet 3   Current Facility-Administered Medications on File Prior to Visit  Medication Dose Route Frequency Provider Last Rate Last Admin   [START ON 04/29/2024] denosumab (PROLIA) injection 60 mg  60 mg Subcutaneous Once Eric Pavlov, MD       Allergies  Allergen Reactions   Amoxicillin Diarrhea, Nausea And Vomiting and Other (See Comments)    Patient questions this   Zoloft [Sertraline Hcl]     Patient didn't like the way it made him feel   Remeron [Mirtazapine] Other (See Comments)    Caused excessive drowsiness   Family History  Problem Relation Age of Onset   Stroke Mother    Pancreatic cancer Father        Deceased, 23   Breast cancer Sister         Deceased   Heart disease Sister 27   Stroke Maternal Aunt    Colon cancer Neg Hx    Prostate cancer Neg Hx    Esophageal cancer Neg Hx    Rectal cancer Neg Hx    Stomach cancer Neg Hx    PE: BP 124/70   Pulse 73   Ht 6\' 2"  (1.88 m)   Wt 153 lb 9.6 oz (69.7 kg)   BMI 19.72 kg/m  Wt Readings from Last 20 Encounters:  12/20/23 153 lb 9.6 oz (69.7 kg)  12/03/23 155 lb 8 oz (70.5 kg)  09/14/23 153 lb 6.4 oz (69.6 kg)  08/31/23 153 lb (69.4 kg)  08/28/23 153 lb 12.8 oz (69.8 kg)  08/01/23 154 lb (69.9 kg)  08/01/23 154 lb 3.2 oz (69.9 kg)  07/09/23 150 lb 3.2 oz (68.1 kg)  05/22/23 152 lb 8 oz (69.2 kg)  04/24/23 150 lb (68 kg)  01/26/23 148 lb 3.2 oz (67.2 kg)  12/19/22 150 lb 12.8 oz (68.4 kg)  11/22/22 146 lb 4 oz (66.3 kg)  09/07/22 146 lb (66.2 kg)  07/25/22 143 lb (64.9 kg)  07/25/22 143 lb 12.8 oz (65.2 kg)  07/11/22 142 lb (64.4 kg)  06/22/22 140 lb 6.9 oz (63.7 kg)  06/20/22 154 lb 5.2 oz (70 kg)  06/08/22 148 lb 9.6 oz (67.4 kg)   Constitutional: thin, in NAD  Eyes:  EOMI, no exophthalmos ENT: no neck masses, no cervical lymphadenopathy Cardiovascular: RRR, No MRG Respiratory: CTA B Musculoskeletal: no deformities Skin:no rashes Neurological: no tremor with outstretched hands  Assessment: 1. Osteoporosis  2.  History of weight loss  Plan: 1. Osteoporosis -Likely age-related and she also has a family history of osteoporosis -He has an increased risk for fracture due to previous history of fractures: Lumbar compression fracture developed after lifting buckets of water for his garden.  He also has a classical osteoporosis right hip fracture, but developed while on Fosamax.  He also has a T-score lower than -2.5. -In the past, after his right hip fracture, he was advised to stop Fosamax.  He did restart afterwards but developed jaw "degeneration" (most likely not ONJ -we looked together at ONJ pictures online and he adamantly denied having had a similar picture,  but I do not have official records).  Per his description, he jaw was locking up while on the medication.  We discussed that this may have been due to osteoarthritis but likely not to the medication.  We started Prolia afterwards and he again developed jaw "degeneration" and stopped.  When I first saw him, we discussed about either restarting Prolia or use an anabolic agent but after our discussion, he decided to try Prolia again.  He did not have any side effects.  He is aware about osteonecrosis of the jaw as a possible side effect but he did not develop this.  He has regular visits with his dentist.  He has no thigh/hip pain.  In fact, after starting Prolia, he started to feel better, with less joint pains -He is bone density scan from 2022 showed that the bone mineral density was significantly improved at the spine and right femoral neck.  His L3 vertebra was not analyzed due to degenerative changes.  Since last visit, he had another bone density 02/12/2023 which showed slight decrease in T-scores, but the decrease did not reach statistical significance.  We decided to continue with Prolia. -He had 2 Prolia injections since last visit: 04/24/2023 and 10/30/2023 -He is aware about not leaving more than 6 months between injections -We can continue with Prolia for 10 years or more, if needed -He continues on vitamin D 1000 units daily + the amount of multivitamins.  He is also on Tums 1 tablet daily. -He is aware about fall precautions.  He does have a walker but does not use it.  No falls since last visit. -Will recheck his vitamin D level today -I will see him back in a year  2.  History of weight loss -Patient describes that he had lost 15 pounds in the year after he had COVID-19 infection in 2023.  He was nauseated and was unable to eat and was barely able to drink.  He feels that he lost both fat and muscle mass at that time.  However, afterwards, he was able to gain the weight back -As of now, he is  trying to build muscle and exercising consistently at the gym.  He is also walking for exercise. -He gained 3 pounds since last visit.  He feels that he has a good diet. -At today's visit we reviewed his BMI and this is lower than 20.  We discussed that studies show that the best BMI  to prevent osteoporosis complications is actually 24, but as a first step, I advised him to aim for increasing this to above 20.  Orders Placed This Encounter  Procedures   Vitamin D, 25-hydroxy   Component     Latest Ref Rng 12/20/2023  Vitamin D, 25-Hydroxy     30 - 100 ng/mL 43   Normal vitamin D.  Eric Pavlov, MD PhD [facility]

## 2023-12-20 NOTE — Patient Instructions (Signed)
Please stop at the lab.  Continue vitamin D 1000 units daily and the multivitamin.  Please come back for a follow-up appointment in 1 year.

## 2023-12-21 ENCOUNTER — Encounter: Payer: Self-pay | Admitting: Internal Medicine

## 2023-12-21 LAB — VITAMIN D 25 HYDROXY (VIT D DEFICIENCY, FRACTURES): Vit D, 25-Hydroxy: 43 ng/mL (ref 30–100)

## 2024-01-22 ENCOUNTER — Encounter (HOSPITAL_BASED_OUTPATIENT_CLINIC_OR_DEPARTMENT_OTHER): Payer: Self-pay | Admitting: Emergency Medicine

## 2024-01-22 ENCOUNTER — Other Ambulatory Visit: Payer: Self-pay

## 2024-01-22 ENCOUNTER — Emergency Department (HOSPITAL_BASED_OUTPATIENT_CLINIC_OR_DEPARTMENT_OTHER)
Admission: EM | Admit: 2024-01-22 | Discharge: 2024-01-22 | Disposition: A | Discharge status: Home / Self Care | Payer: Medicare Other | Attending: Emergency Medicine | Admitting: Emergency Medicine

## 2024-01-22 ENCOUNTER — Emergency Department (HOSPITAL_BASED_OUTPATIENT_CLINIC_OR_DEPARTMENT_OTHER): Payer: Medicare Other

## 2024-01-22 DIAGNOSIS — M25552 Pain in left hip: Secondary | ICD-10-CM | POA: Diagnosis not present

## 2024-01-22 DIAGNOSIS — Z7901 Long term (current) use of anticoagulants: Secondary | ICD-10-CM | POA: Insufficient documentation

## 2024-01-22 DIAGNOSIS — Z743 Need for continuous supervision: Secondary | ICD-10-CM | POA: Diagnosis not present

## 2024-01-22 DIAGNOSIS — M1612 Unilateral primary osteoarthritis, left hip: Secondary | ICD-10-CM | POA: Diagnosis not present

## 2024-01-22 DIAGNOSIS — K5730 Diverticulosis of large intestine without perforation or abscess without bleeding: Secondary | ICD-10-CM | POA: Diagnosis not present

## 2024-01-22 DIAGNOSIS — M1611 Unilateral primary osteoarthritis, right hip: Secondary | ICD-10-CM | POA: Diagnosis not present

## 2024-01-22 DIAGNOSIS — M8580 Other specified disorders of bone density and structure, unspecified site: Secondary | ICD-10-CM | POA: Diagnosis not present

## 2024-01-22 DIAGNOSIS — S72001A Fracture of unspecified part of neck of right femur, initial encounter for closed fracture: Secondary | ICD-10-CM

## 2024-01-22 NOTE — Discharge Instructions (Signed)
 You were seen in the emergency department for left hip pain.  You had x-rays and a CAT scan of your hip that did not show any fracture.  This may be related to arthritis.  Please use your walker and follow-up with your orthopedic doctor.  Return to the emergency department if any worsening or concerning symptoms

## 2024-01-22 NOTE — ED Provider Notes (Signed)
 [facility] Provider Note   CSN: [id number] Arrival date & time: 01/22/24  1407     History {Add pertinent medical, surgical, social history, OB history to HPI:1} Chief Complaint  Patient presents with   Hip Pain    [name] is a 89 y.o. male.  He is here with complaint of left hip pain that is been going on for 4 days.  He has no pain at rest but has significant pain every time he tries to bear weight.  He does not recall any trauma but did say he walked more than normal prior to it occurring.  He is very active, does exercises and tries to stay moving all the time.  No numbness or weakness.  The history is provided by the patient.  Hip Pain This is a new problem. The current episode started more than 2 days ago. The problem has not changed since onset.Pertinent negatives include no chest pain, no abdominal pain, no headaches and no shortness of breath. The symptoms are aggravated by walking and standing. Nothing relieves the symptoms. He has tried rest for the symptoms. The treatment provided no relief.       Home Medications Prior to Admission medications   Medication Sig Start Date End Date Taking? Authorizing Provider  acetaminophen (TYLENOL) 650 MG CR tablet Take 325-650 mg by mouth every 8 (eight) hours as needed for pain (Arthritis).    [provider]  amiodarone (PACERONE) 200 MG tablet Take 1 tablet (200 mg total) by mouth daily. 08/28/23   Tereso Newcomer T, PA-C  apixaban (ELIQUIS) 5 MG TABS tablet Take 1 tablet (5 mg total) by mouth 2 (two) times daily. 08/08/23   Wendall Stade, MD  bisacodyl 5 MG EC tablet Take 5 mg by mouth 3 (three) times daily. Dulcolax soft gel    [provider]  calcium carbonate (TUMS - DOSED IN MG ELEMENTAL CALCIUM) 500 MG chewable tablet Chew 500 mg by mouth daily.    [provider]  Cholecalciferol (VITAMIN D-3) 25 MCG (1000 UT) CAPS Take 1,000 Units by mouth daily with  breakfast.    [provider]  denosumab (PROLIA) 60 MG/ML SOSY injection Inject 60 mg into the skin every 6 (six) months.    [provider]  famotidine-calcium carbonate-magnesium hydroxide (PEPCID COMPLETE) 10-800-165 MG chewable tablet Chew 1 tablet by mouth daily as needed (indigestion).    [provider]  folic acid (FOLVITE) 1 MG tablet Take 1 tablet (1 mg total) by mouth daily. Please call [phone] to schedule an appointment for August for future refills. Thank you. 08/08/23   Tereso Newcomer T, PA-C  Glycerin-Hypromellose-PEG 400 (DRY EYE RELIEF DROPS) 0.2-0.2-1 % SOLN Place 1 drop into both eyes every morning. Lubricating eyes    [provider]  Multiple Vitamin (MULITIVITAMIN WITH MINERALS) TABS Take 1 tablet by mouth daily.    [provider]  nitroGLYCERIN (NITROSTAT) 0.4 MG SL tablet DISSOLVE 1 TABLET UNDER TONGUE EVERY 5 MINUTES FOR UP TO 3 DOSES AS NEEDED FOR CHEST PAIN 01/05/22   Wendall Stade, MD  rosuvastatin (CRESTOR) 10 MG tablet Take 1 tablet (10 mg total) by mouth daily. 10/08/23   Tereso Newcomer T, PA-C      Allergies    Amoxicillin, Zoloft [sertraline hcl], and Remeron [mirtazapine]    Review of Systems   Review of Systems  Respiratory:  Negative for shortness of breath.   Cardiovascular:  Negative for  chest pain.  Gastrointestinal:  Negative for abdominal pain.  Neurological:  Negative for headaches.    Physical Exam Updated Vital Signs BP (!) 153/60 (BP Location: Right Arm)   Pulse (!) 59   Temp 98.2 F (36.8 C) (Oral)   Resp 12   Ht 6\' 2"  (1.88 m)   Wt 69.7 kg   SpO2 100%   BMI 19.73 kg/m  Physical Exam Vitals and nursing note reviewed.  Constitutional:      Appearance: Normal appearance. He is well-developed.  HENT:     Head: Normocephalic and atraumatic.  Eyes:     Conjunctiva/sclera: Conjunctivae normal.  Cardiovascular:     Rate and Rhythm: Normal rate and regular rhythm.     Heart sounds: No  murmur heard. Pulmonary:     Effort: Pulmonary effort is normal. No respiratory distress.     Breath sounds: Normal breath sounds. No stridor.  Abdominal:     Palpations: Abdomen is soft.     Tenderness: There is no abdominal tenderness. There is no guarding or rebound.  Musculoskeletal:        General: No tenderness or deformity. Normal range of motion.     Cervical back: Neck supple.  Skin:    General: Skin is warm and dry.  Neurological:     General: No focal deficit present.     Mental Status: He is alert.     GCS: GCS eye subscore is 4. GCS verbal subscore is 5. GCS motor subscore is 6.     Sensory: No sensory deficit.     Motor: No weakness.     ED Results / Procedures / Treatments   Labs (all labs ordered are listed, but only abnormal results are displayed) Labs Reviewed - No data to display  EKG None  Radiology DG Hip Unilat W or Wo Pelvis 2-3 Views Left Result Date: 01/22/2024 CLINICAL DATA:  Left hip pain no known injury. EXAM: DG HIP (WITH OR WITHOUT PELVIS) 2-3V LEFT COMPARISON:  Pelvis and left hip radiographs 11/26/2019 FINDINGS: There is diffuse decreased bone mineralization. Postsurgical changes are again seen of right cephalomedullary nail fixation of the proximal right femur, partially visualized. No perihardware lucency is seen to indicate hardware failure or loosening. Moderate to severe superior right and mild-to-moderate left femoroacetabular joint space narrowing. The pubic symphysis joint space is maintained. Mild bilateral sacroiliac subchondral sclerosis, unchanged. No acute fracture or dislocation. IMPRESSION: Moderate right and mild left femoroacetabular osteoarthritis. Electronically Signed   By: Neita Garnet M.D.   On: 01/22/2024 15:37    Procedures Procedures  {Document cardiac monitor, telemetry assessment procedure when appropriate:1}  Medications Ordered in ED Medications - No data to display  ED Course/ Medical Decision Making/ A&P   {    Click here for ABCD2, HEART and other calculatorsREFRESH Note before signing :1}                              Medical Decision Making Amount and/or Complexity of Data Reviewed Radiology: ordered.   This patient complains of ***; this involves an extensive number of treatment Options and is a complaint that carries with it a high risk of complications and morbidity. The differential includes ***  I ordered, reviewed and interpreted labs, which included *** I ordered medication *** and reviewed PMP when indicated. I ordered imaging studies which included *** and I independently    visualized and interpreted imaging which showed *** Additional  history obtained from *** Previous records obtained and reviewed *** I consulted *** and discussed lab and imaging findings and discussed disposition.  Cardiac monitoring reviewed, *** Social determinants considered, *** Critical Interventions: ***  After the interventions stated above, I reevaluated the patient and found *** Admission and further testing considered, ***   {Document critical care time when appropriate:1} {Document review of labs and clinical decision tools ie heart score, Chads2Vasc2 etc:1}  {Document your independent review of radiology images, and any outside records:1} {Document your discussion with family members, caretakers, and with consultants:1} {Document social determinants of health affecting pt's care:1} {Document your decision making why or why not admission, treatments were needed:1} Final Clinical Impression(s) / ED Diagnoses Final diagnoses:  None    Rx / DC Orders ED Discharge Orders     None

## 2024-01-22 NOTE — ED Triage Notes (Addendum)
 Pt brought in by [facility]  from [facility] Left hip pain and hurts to bear weight since sat , no fall  he states  able to stand with assist but hurts

## 2024-01-23 DIAGNOSIS — M160 Bilateral primary osteoarthritis of hip: Secondary | ICD-10-CM | POA: Diagnosis not present

## 2024-01-25 DIAGNOSIS — M1612 Unilateral primary osteoarthritis, left hip: Secondary | ICD-10-CM | POA: Diagnosis not present

## 2024-01-30 DIAGNOSIS — M25552 Pain in left hip: Secondary | ICD-10-CM | POA: Diagnosis not present

## 2024-01-30 DIAGNOSIS — R2689 Other abnormalities of gait and mobility: Secondary | ICD-10-CM | POA: Diagnosis not present

## 2024-01-31 DIAGNOSIS — M25552 Pain in left hip: Secondary | ICD-10-CM | POA: Diagnosis not present

## 2024-01-31 DIAGNOSIS — R2689 Other abnormalities of gait and mobility: Secondary | ICD-10-CM | POA: Diagnosis not present

## 2024-02-01 DIAGNOSIS — R2689 Other abnormalities of gait and mobility: Secondary | ICD-10-CM | POA: Diagnosis not present

## 2024-02-01 DIAGNOSIS — M25552 Pain in left hip: Secondary | ICD-10-CM | POA: Diagnosis not present

## 2024-02-04 ENCOUNTER — Other Ambulatory Visit: Payer: Self-pay | Admitting: Physician Assistant

## 2024-02-05 DIAGNOSIS — R2689 Other abnormalities of gait and mobility: Secondary | ICD-10-CM | POA: Diagnosis not present

## 2024-02-05 DIAGNOSIS — M25552 Pain in left hip: Secondary | ICD-10-CM | POA: Diagnosis not present

## 2024-02-07 DIAGNOSIS — M25552 Pain in left hip: Secondary | ICD-10-CM | POA: Diagnosis not present

## 2024-02-07 DIAGNOSIS — R2689 Other abnormalities of gait and mobility: Secondary | ICD-10-CM | POA: Diagnosis not present

## 2024-02-08 DIAGNOSIS — R2689 Other abnormalities of gait and mobility: Secondary | ICD-10-CM | POA: Diagnosis not present

## 2024-02-08 DIAGNOSIS — M25552 Pain in left hip: Secondary | ICD-10-CM | POA: Diagnosis not present

## 2024-02-12 DIAGNOSIS — R2689 Other abnormalities of gait and mobility: Secondary | ICD-10-CM | POA: Diagnosis not present

## 2024-02-12 DIAGNOSIS — M25552 Pain in left hip: Secondary | ICD-10-CM | POA: Diagnosis not present

## 2024-02-14 DIAGNOSIS — R2689 Other abnormalities of gait and mobility: Secondary | ICD-10-CM | POA: Diagnosis not present

## 2024-02-14 DIAGNOSIS — M25552 Pain in left hip: Secondary | ICD-10-CM | POA: Diagnosis not present

## 2024-02-15 DIAGNOSIS — R2689 Other abnormalities of gait and mobility: Secondary | ICD-10-CM | POA: Diagnosis not present

## 2024-02-15 DIAGNOSIS — M25552 Pain in left hip: Secondary | ICD-10-CM | POA: Diagnosis not present

## 2024-02-19 DIAGNOSIS — M25552 Pain in left hip: Secondary | ICD-10-CM | POA: Diagnosis not present

## 2024-02-19 DIAGNOSIS — R2689 Other abnormalities of gait and mobility: Secondary | ICD-10-CM | POA: Diagnosis not present

## 2024-02-21 DIAGNOSIS — R2689 Other abnormalities of gait and mobility: Secondary | ICD-10-CM | POA: Diagnosis not present

## 2024-02-21 DIAGNOSIS — M25552 Pain in left hip: Secondary | ICD-10-CM | POA: Diagnosis not present

## 2024-02-22 DIAGNOSIS — M25552 Pain in left hip: Secondary | ICD-10-CM | POA: Diagnosis not present

## 2024-02-22 DIAGNOSIS — R2689 Other abnormalities of gait and mobility: Secondary | ICD-10-CM | POA: Diagnosis not present

## 2024-02-25 DIAGNOSIS — M25552 Pain in left hip: Secondary | ICD-10-CM | POA: Diagnosis not present

## 2024-02-25 DIAGNOSIS — R2689 Other abnormalities of gait and mobility: Secondary | ICD-10-CM | POA: Diagnosis not present

## 2024-02-26 DIAGNOSIS — M25552 Pain in left hip: Secondary | ICD-10-CM | POA: Diagnosis not present

## 2024-02-26 DIAGNOSIS — R2689 Other abnormalities of gait and mobility: Secondary | ICD-10-CM | POA: Diagnosis not present

## 2024-02-28 DIAGNOSIS — M25552 Pain in left hip: Secondary | ICD-10-CM | POA: Diagnosis not present

## 2024-02-28 DIAGNOSIS — R2689 Other abnormalities of gait and mobility: Secondary | ICD-10-CM | POA: Diagnosis not present

## 2024-03-03 DIAGNOSIS — S7290XA Unspecified fracture of unspecified femur, initial encounter for closed fracture: Secondary | ICD-10-CM | POA: Insufficient documentation

## 2024-03-03 DIAGNOSIS — R2689 Other abnormalities of gait and mobility: Secondary | ICD-10-CM | POA: Diagnosis not present

## 2024-03-03 DIAGNOSIS — M25552 Pain in left hip: Secondary | ICD-10-CM | POA: Diagnosis not present

## 2024-03-04 ENCOUNTER — Encounter: Payer: Self-pay | Admitting: Internal Medicine

## 2024-03-04 ENCOUNTER — Other Ambulatory Visit: Payer: Self-pay | Admitting: Cardiovascular Disease

## 2024-03-04 ENCOUNTER — Ambulatory Visit (INDEPENDENT_AMBULATORY_CARE_PROVIDER_SITE_OTHER): Payer: Medicare Other | Admitting: Internal Medicine

## 2024-03-04 VITALS — BP 130/56 | HR 61 | Temp 98.1°F | Resp 18 | Ht 74.0 in | Wt 153.5 lb

## 2024-03-04 DIAGNOSIS — M79671 Pain in right foot: Secondary | ICD-10-CM

## 2024-03-04 DIAGNOSIS — M79672 Pain in left foot: Secondary | ICD-10-CM

## 2024-03-04 DIAGNOSIS — E782 Mixed hyperlipidemia: Secondary | ICD-10-CM

## 2024-03-04 DIAGNOSIS — I480 Paroxysmal atrial fibrillation: Secondary | ICD-10-CM

## 2024-03-04 DIAGNOSIS — R739 Hyperglycemia, unspecified: Secondary | ICD-10-CM | POA: Diagnosis not present

## 2024-03-04 DIAGNOSIS — L84 Corns and callosities: Secondary | ICD-10-CM

## 2024-03-04 DIAGNOSIS — K5900 Constipation, unspecified: Secondary | ICD-10-CM

## 2024-03-04 LAB — BASIC METABOLIC PANEL WITH GFR
BUN: 23 mg/dL (ref 6–23)
CO2: 30 meq/L (ref 19–32)
Calcium: 9.9 mg/dL (ref 8.4–10.5)
Chloride: 100 meq/L (ref 96–112)
Creatinine, Ser: 0.92 mg/dL (ref 0.40–1.50)
GFR: 72.76 mL/min (ref >60.00)
Glucose, Bld: 86 mg/dL (ref 70–99)
Potassium: 5.3 meq/L — ABNORMAL HIGH (ref 3.5–5.1)
Sodium: 139 meq/L (ref 135–145)

## 2024-03-04 LAB — CBC WITH DIFFERENTIAL/PLATELET
Basophils Absolute: 0.1 10*3/uL (ref 0.0–0.1)
Basophils Relative: 1.3 % (ref 0.0–3.0)
Eosinophils Absolute: 0.2 10*3/uL (ref 0.0–0.7)
Eosinophils Relative: 2.1 % (ref 0.0–5.0)
HCT: 40.6 % (ref 39.0–52.0)
Hemoglobin: 13.2 g/dL (ref 13.0–17.0)
Lymphocytes Relative: 15.1 % (ref 12.0–46.0)
Lymphs Abs: 1.2 10*3/uL (ref 0.7–4.0)
MCHC: 32.6 g/dL (ref 30.0–36.0)
MCV: 99.7 fl (ref 78.0–100.0)
Monocytes Absolute: 0.8 10*3/uL (ref 0.1–1.0)
Monocytes Relative: 9.7 % (ref 3.0–12.0)
Neutro Abs: 5.6 10*3/uL (ref 1.4–7.7)
Neutrophils Relative %: 71.8 % (ref 43.0–77.0)
Platelets: 186 10*3/uL (ref 150.0–400.0)
RBC: 4.07 Mil/uL — ABNORMAL LOW (ref 4.22–5.81)
RDW: 16.2 % — ABNORMAL HIGH (ref 11.5–15.5)
WBC: 7.8 10*3/uL (ref 4.0–10.5)

## 2024-03-04 LAB — T4, FREE: Free T4: 0.99 ng/dL (ref 0.60–1.60)

## 2024-03-04 LAB — TSH: TSH: 3.29 u[IU]/mL (ref 0.35–5.50)

## 2024-03-04 LAB — T3, FREE: T3, Free: 3.3 pg/mL (ref 2.3–4.2)

## 2024-03-04 MED ORDER — DOCUSATE SODIUM 100 MG PO CAPS
100.0000 mg | ORAL_CAPSULE | Freq: Two times a day (BID) | ORAL | 1 refills | Status: DC
Start: 2024-03-04 — End: 2024-08-04

## 2024-03-04 NOTE — Assessment & Plan Note (Signed)
 DJD: Recently c/o left hip pain, CT of the ER showed no fracture, saw Ortho, they recommended PT, currently doing better as far as the hip pain is concerned, having some back pain but managing well with Tylenol. Feet pain: New issue, on exam he has preulcerative callus at the base of the right great toe.  Also the fat pad is very thin on the plantar area.  Refer to podiatry for further advice, may benefit from shoe inserts. Atrial fibrillation: Anticoagulated without apparent problems, check a BMP, CBC, TFTs. Osteoporosis: Saw Endo 12/20/2023, was recommended to continue Prolia.  Vitamin D. Constipation, hard stools: Dulcolax is not working, still having some hard stools.  Switch to Colace twice daily, Rx sent. Hyperlipidemia: Off rosuvastatin due to brain fog as recommended by cardiology. RTC 3 months CPX

## 2024-03-04 NOTE — Progress Notes (Signed)
 Patient ID: [name], male   DOB: 05-26-1930, 89 y.o.   MRN: [id number]     89 y.o. with history of CABG in 2011.  Has had atypical chest pains in past. Most recent normal myovue done 07/29/21 with no ischemia and EF 57% Had post op PAF with recurrence in 2015 Multaq d/c due to GI side effects On eliquis Beta blocker reserved due to bradycardia. Started on amiodarone and had [facility] 08/2023.    Still living at [name] last 13years and likes it Some left hip pain Uses walker PRN getting RX Had acute worsening in February 2025 Seeing [name]   ROS: Denies fever, malais, weight loss, blurry vision, decreased visual acuity, cough, sputum, SOB, hemoptysis, pleuritic pain, palpitaitons, heartburn, abdominal pain, melena, lower extremity edema, claudication, or rash.  All other systems reviewed and negative  General: BP (!) 116/53   Pulse (!) 51   Ht 6\' 2"  (1.88 m)   Wt 153 lb (69.4 kg)   SpO2 93%   BMI 19.64 kg/m  Affect appropriate Healthy:  appears stated age HEENT: normal Neck supple with no adenopathy JVP normal no bruits no thyromegaly Lungs clear with no wheezing and good diaphragmatic motion Heart:  S1/S2 no murmur, no rub, gallop or click PMI normal post sternotomy  Abdomen: benighn, BS positve, no tenderness, no AAA no bruit.  No HSM or HJR Distal pulses intact with no bruits No edema Neuro non-focal Skin warm and dry No muscular weakness   Current Outpatient Medications  Medication Sig Dispense Refill   acetaminophen (TYLENOL) 650 MG CR tablet Take 325-650 mg by mouth every 8 (eight) hours as needed for pain (Arthritis).     amiodarone (PACERONE) 200 MG tablet Take 1 tablet (200 mg total) by mouth daily. 90 tablet 3   calcium carbonate (TUMS - DOSED IN MG ELEMENTAL CALCIUM) 500 MG chewable tablet Chew 500 mg by mouth daily.     Cholecalciferol (VITAMIN D-3) 25 MCG (1000 UT) CAPS Take 1,000 Units by mouth daily with breakfast.     denosumab (PROLIA) 60 MG/ML SOSY  injection Inject 60 mg into the skin every 6 (six) months.     docusate sodium (COLACE) 100 MG capsule Take 1 capsule (100 mg total) by mouth 2 (two) times daily. 180 capsule 1   ELIQUIS 5 MG TABS tablet TAKE 1 TABLET (5 MG TOTAL) BY MOUTH 2 (TWO) TIMES DAILY. 60 tablet 5   famotidine-calcium carbonate-magnesium hydroxide (PEPCID COMPLETE) 10-800-165 MG chewable tablet Chew 1 tablet by mouth daily as needed (indigestion).     folic acid (FOLVITE) 1 MG tablet TAKE 1 TABLET (1 MG TOTAL) BY MOUTH DAILY. 90 tablet 0   Glycerin-Hypromellose-PEG 400 (DRY EYE RELIEF DROPS) 0.2-0.2-1 % SOLN Place 1 drop into both eyes every morning. Lubricating eyes     Multiple Vitamin (MULITIVITAMIN WITH MINERALS) TABS Take 1 tablet by mouth daily.     rosuvastatin (CRESTOR) 10 MG tablet Take 1 tablet (10 mg total) by mouth daily. 90 tablet 3   nitroGLYCERIN (NITROSTAT) 0.4 MG SL tablet DISSOLVE 1 TABLET UNDER TONGUE EVERY 5 MINUTES FOR UP TO 3 DOSES AS NEEDED FOR CHEST PAIN (Patient not taking: No sig reported) 25 tablet 9   Current Facility-Administered Medications  Medication Dose Route Frequency Provider Last Rate Last Admin   [START ON 04/29/2024] denosumab (PROLIA) injection 60 mg  60 mg Subcutaneous Once [name], Cristina, MD        Allergies  Amoxicillin, Zoloft [sertraline hcl], and  Remeron [mirtazapine]  Electrocardiogram:   09/14/23 SR rate 52 RBBB   Assessment and Plan CAD/CABG:  2011   Normal myovue9/2/22 no angina  with EF 57%   PAF: [facility] 08/31/2023 Mutlaq d/c due to GI side effects On amiodarone   GERD continue protonix concern for weight loss CT abdomen negative FU GI    Chol at goal continue crestor LDL 20   Osteoporosis:  Continue Vit D and prolia f/u [facility]   Urology:  Elevated PSA 5.66 f/u urology  Ortho:  f/u [name] left hip arthritis PT/OT Using Tylenol for pain   F/U with cardiology in a year    Eric Mediate MD [facility]

## 2024-03-04 NOTE — Patient Instructions (Addendum)
 For feet pain, we are referring you to podiatry  For constipation: Stop bisacodyl Start docusate (COLACE) 100 mg twice daily.  You can decrease to once daily only if needed.     GO TO THE LAB : Get the blood work     Please go to the front desk: Arrange for a physical exam in 4  months

## 2024-03-04 NOTE — Telephone Encounter (Signed)
 Eliquis 5mg  refill request received. Patient is 89 years old, weight-69.7kg, Crea-1.10 on 08/30/23, Diagnosis-Afib, and last seen by Tereso Newcomer on 09/14/23. Dose is appropriate based on dosing criteria. Will send in refill to requested pharmacy.

## 2024-03-04 NOTE — Progress Notes (Signed)
 Subjective:    Patient ID: [name], male    DOB: November 09, 1930, 89 y.o.   MRN: [id number]  DOS:  03/04/2024 Type of visit - description: Follow-up  Chronic medical problems addressed.  We also addressed the following issues:  ER 01/22/2024: went to the  ER visit, significant L hip pain. CT left hip negative. Hip x-rays: DJD Subsequently saw orthopedics, Rx PT. At this point hip pain is better, having some back pain but is well-controlled with Tylenol as needed.  Complaining of bilateral feet pain, at the base of the toes.  No swelling.  Has constipation, on Dulcolax 3 times daily, despite that occasionally has hard bowel movements that cause peri rectal pain. He is anticoagulated but denies rectal bleeding, no nausea or vomiting.  Wt Readings from Last 3 Encounters:  03/04/24 153 lb 8 oz (69.6 kg)  01/22/24 153 lb 10.6 oz (69.7 kg)  12/20/23 153 lb 9.6 oz (69.7 kg)     Review of Systems See above   Past Medical History:  Diagnosis Date   Anxiety    Atrial fibrillation (HCC)    Blood transfusion without reported diagnosis    CAD    a. 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 // Myoview 9/22: EF 57, normal perfusion; low risk   Cataract    Chronic abdominal pain    Chronic nausea    Coronary artery disease    Diverticulosis 2011   Diverticulitis 2004   Dyspepsia    Esophageal reflux    GERD (gastroesophageal reflux disease)    Headache    saw neuro 4-16, MRI done   History of echocardiogram    Echo 9/22: EF 60-65, no RWMA, normal diastolic function, GLS -24.9, normal RVSF, normal PASP, mild-moderate BAE, trivial MR, moderate MAC, moderate AV calcification, mild AI, mild aortic stenosis (mean gradient 5 mmHg, V-max 158 cm/s, DI 0.62), mild dilation of ascending aorta (40 mm)   HYPERPLASIA, PROSTATE NOS W/URINARY OBST/LUTS    IBS    INGUINAL HERNIA    Lichen planus 2006   Francesca Oman MD    LUMBAR RADICULOPATHY, RIGHT    Mixed hyperlipidemia    OSTEOPOROSIS    PONV (postoperative nausea and vomiting)    PREMATURE VENTRICULAR CONTRACTIONS    a. After CABG - was on Coumadin/Multaq for a period of time. Coumadin discontinued 04/2010 after maintaining NSR.   UNSPECIFIED ANEMIA     Past Surgical History:  Procedure Laterality Date   BIOPSY  09/10/2018   Procedure: BIOPSY;  Surgeon: Beverley Fiedler, MD;  Location: [facility];  Service: Gastroenterology;;   CARDIOVERSION N/A 08/31/2023   Procedure: CARDIOVERSION;  Surgeon: Tessa Lerner, DO;  Location: [facility];  Service: Cardiovascular;  Laterality: N/A;   CATARACT EXTRACTION Right 10/06/2013   CORONARY ARTERY BYPASS GRAFT     2 VD;anomalous RCA;post op compliacted by AF   ESOPHAGOGASTRODUODENOSCOPY (EGD) WITH PROPOFOL N/A 09/10/2018   Procedure: ESOPHAGOGASTRODUODENOSCOPY (EGD) WITH PROPOFOL;  Surgeon: Beverley Fiedler, MD;  Location: [facility];  Service: Gastroenterology;  Laterality: N/A;   HIP FRACTURE SURGERY Right 03/2007   Dr. Erin Sons   INGUINAL HERNIA REPAIR Left 2012   Dr Luisa Hart   KNEE SURGERY Right 1977   repair   PROSTATE BIOPSY  09/19/2013   [facility]    Current Outpatient Medications  Medication Instructions   acetaminophen (TYLENOL) 325-650 mg, Every 8 hours PRN  amiodarone (PACERONE) 200 mg, Oral, Daily   calcium carbonate (TUMS - DOSED IN MG ELEMENTAL CALCIUM) 500 mg, Daily   denosumab (PROLIA) 60 mg, Every 6 months   docusate sodium (COLACE) 100 mg, Oral, 2 times daily   Eliquis 5 mg, Oral, 2 times daily   famotidine-calcium carbonate-magnesium hydroxide (PEPCID COMPLETE) 10-800-165 MG chewable tablet 1 tablet, Daily PRN   folic acid (FOLVITE) 1 mg, Oral, Daily   Glycerin-Hypromellose-PEG 400 (DRY EYE RELIEF DROPS) 0.2-0.2-1 % SOLN 1 drop, Every morning   Multiple Vitamin (MULITIVITAMIN WITH MINERALS) TABS 1 tablet, Daily   nitroGLYCERIN (NITROSTAT) 0.4 MG SL tablet DISSOLVE 1 TABLET  UNDER TONGUE EVERY 5 MINUTES FOR UP TO 3 DOSES AS NEEDED FOR CHEST PAIN   rosuvastatin (CRESTOR) 10 mg, Oral, Daily   Vitamin D-3 1,000 Units, Daily with breakfast       Objective:   Physical Exam BP (!) 130/56   Pulse 61   Temp 98.1 F (36.7 C) (Oral)   Resp 18   Ht 6\' 2"  (1.88 m)   Wt 153 lb 8 oz (69.6 kg)   SpO2 94%   BMI 19.71 kg/m  General:   Well developed, NAD, BMI noted. HEENT:  Normocephalic . Face symmetric, atraumatic Lungs:  CTA B Normal respiratory effort, no intercostal retractions, no accessory muscle use. Heart: RRR,  no murmur.  Lower extremities: No edema, good pedal pulses, has some DJD deformities of the toes. Has a preulcerative callus at the base of the right great toe. Skin: Not pale. Not jaundice Neurologic:  alert & oriented X3.  Speech normal, gait slow and assisted by rolling walker. Psych--  Cognition and judgment appear intact.  Cooperative with normal attention span and concentration.  Behavior appropriate. No anxious or depressed appearing.      Assessment    Problem list Prediabetes, A1c 6.0 (2015) Hyperlipidemia Anxiety, depression:  -intol to zoloft, Rx lexapro 5 mg 07-2015: intolerant d/t nausea  -lost wife July 2017 CV: ---CAD--CABG 2011 ----P- Atrial fibrillation, PVCs.  Elliquis ; Multaq d/c d/t GI s/e ----Palpable Ao x years, [place] (-) for AAA 2005 COPD (per CXR 06-2015), PFTs 10-04-15 mild obstruction DJD  Gait-- uses a walker prn GI: --GERD, IBS, diverticulosis --Chronic nausea: 2019.  Work-up: 08-2018: Negative EGD, CT  and US abdomen. 12/2018 wnl Gastric empty stomach.  Symptoms decreased after Multaq stopped GU: elevated PSA, BPH , (-) bx 2014 Osteoporosis:   (per ENDO) -hip FX 2008 - dexa ~2005 --> rx fosamax, took x 1 year, dexa ~2010 was rec no fosamax at that time. Dexa 09-2015: T score -2.4 --> rx fosamax, d/c d/t jaw pain 06-2017; took prolia #25 February 2018; T score (-) 30 December 2018  Lichen planusHeadache -- saw  neurology 02-2015, had a MRI/MRA (-) COPD: PFTs 08-2020: Mild obstruction with some air trapping.  Insignificant response to bronchodilators.   PLAN DJD: Recently c/o left hip pain, CT of the ER showed no fracture, saw Ortho, they recommended PT, currently doing better as far as the hip pain is concerned, having some back pain but managing well with Tylenol. Feet pain: New issue, on exam he has preulcerative callus at the base of the right great toe.  Also the fat pad is very thin on the plantar area.  Refer to podiatry for further advice, may benefit from shoe inserts. Atrial fibrillation: Anticoagulated without apparent problems, check a BMP, CBC, TFTs. Osteoporosis: Saw Endo 12/20/2023, was recommended to continue Prolia.  Vitamin D. Constipation, hard stools:  Dulcolax is not working, still having some hard stools.  Switch to Colace twice daily, Rx sent. Hyperlipidemia: Off rosuvastatin due to brain fog as recommended by cardiology. RTC 3 months CPX

## 2024-03-05 DIAGNOSIS — M25552 Pain in left hip: Secondary | ICD-10-CM | POA: Diagnosis not present

## 2024-03-05 DIAGNOSIS — R2689 Other abnormalities of gait and mobility: Secondary | ICD-10-CM | POA: Diagnosis not present

## 2024-03-06 ENCOUNTER — Encounter: Payer: Self-pay | Admitting: Internal Medicine

## 2024-03-06 DIAGNOSIS — R2689 Other abnormalities of gait and mobility: Secondary | ICD-10-CM | POA: Diagnosis not present

## 2024-03-06 DIAGNOSIS — M25552 Pain in left hip: Secondary | ICD-10-CM | POA: Diagnosis not present

## 2024-03-07 ENCOUNTER — Encounter: Payer: Self-pay | Admitting: Podiatry

## 2024-03-07 ENCOUNTER — Ambulatory Visit: Admitting: Podiatry

## 2024-03-07 DIAGNOSIS — L84 Corns and callosities: Secondary | ICD-10-CM

## 2024-03-07 DIAGNOSIS — M79674 Pain in right toe(s): Secondary | ICD-10-CM | POA: Diagnosis not present

## 2024-03-07 DIAGNOSIS — Z7901 Long term (current) use of anticoagulants: Secondary | ICD-10-CM

## 2024-03-07 DIAGNOSIS — B351 Tinea unguium: Secondary | ICD-10-CM | POA: Diagnosis not present

## 2024-03-07 DIAGNOSIS — M79675 Pain in left toe(s): Secondary | ICD-10-CM

## 2024-03-07 NOTE — Progress Notes (Signed)
  Subjective:  Patient ID: [name], male    DOB: May 14, 1930,   MRN: [id number]  No chief complaint on file.   89 y.o. male presents for concern of callus on both feet and  of thickened elongated and painful nails that are difficult to trim. Requesting to have them trimmed today. He is on eliquis and at risk for foot care  PCP:  Wanda Plump, MD    . Denies any other pedal complaints. Denies n/v/f/c.   Past Medical History:  Diagnosis Date   Anxiety    Atrial fibrillation (HCC)    Blood transfusion without reported diagnosis    CAD    a. 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 // Myoview 9/22: EF 57, normal perfusion; low risk   Cataract    Chronic abdominal pain    Chronic nausea    Coronary artery disease    Diverticulosis 2011   Diverticulitis 2004   Dyspepsia    Esophageal reflux    GERD (gastroesophageal reflux disease)    Headache    saw neuro 4-16, MRI done   History of echocardiogram    Echo 9/22: EF 60-65, no RWMA, normal diastolic function, GLS -24.9, normal RVSF, normal PASP, mild-moderate BAE, trivial MR, moderate MAC, moderate AV calcification, mild AI, mild aortic stenosis (mean gradient 5 mmHg, V-max 158 cm/s, DI 0.62), mild dilation of ascending aorta (40 mm)   HYPERPLASIA, PROSTATE NOS W/URINARY OBST/LUTS    IBS    INGUINAL HERNIA    Lichen planus 2006   Francesca Oman MD   LUMBAR RADICULOPATHY, RIGHT    Mixed hyperlipidemia    OSTEOPOROSIS    PONV (postoperative nausea and vomiting)    PREMATURE VENTRICULAR CONTRACTIONS    a. After CABG - was on Coumadin/Multaq for a period of time. Coumadin discontinued 04/2010 after maintaining NSR.   UNSPECIFIED ANEMIA     Objective:  Physical Exam: Vascular: DP/PT pulses 2/4 bilateral. CFT <3 seconds. Absent hair growth on digits. Edema noted to bilateral lower extremities. Xerosis noted bilaterally.  Skin. No lacerations or abrasions  bilateral feet. Nails 1-5 bilateral  are thickened discolored and elongated with subungual debris. Hyperkeratotic lesion noted sub first metatarsal head bilateral and left fifth metatarsal head Musculoskeletal: MMT 5/5 bilateral lower extremities in DF, PF, Inversion and Eversion. Deceased ROM in DF of ankle joint.  Neurological: Sensation intact to light touch. Protective sensation intact bilateral.    Assessment:   1. Pain due to onychomycosis of toenails of both feet   2. Chronic anticoagulation   3. Pre-ulcerative calluses      Plan:  Patient was evaluated and treated and all questions answered. -Discussed and educated patient on foot care, especially with  regards to the vascular, neurological and musculoskeletal systems.  -Hyperkeratotic tissue noted sub first metatarsal bilateral and left fifth metatarsal head debrided without incident with chisel.  -Mechanically debrided all nails 1-5 bilateral using sterile nail nipper and filed with dremel without incident  -Answered all patient questions -Patient to return  in 3 months for at risk foot care -Patient advised to call the office if any problems or questions arise in the meantime.   Louann Sjogren, DPM

## 2024-03-10 DIAGNOSIS — M25552 Pain in left hip: Secondary | ICD-10-CM | POA: Diagnosis not present

## 2024-03-10 DIAGNOSIS — R2689 Other abnormalities of gait and mobility: Secondary | ICD-10-CM | POA: Diagnosis not present

## 2024-03-11 DIAGNOSIS — M25552 Pain in left hip: Secondary | ICD-10-CM | POA: Diagnosis not present

## 2024-03-12 ENCOUNTER — Encounter: Payer: Self-pay | Admitting: Cardiovascular Disease

## 2024-03-12 ENCOUNTER — Ambulatory Visit: Payer: Medicare Other | Attending: Cardiovascular Disease | Admitting: Cardiovascular Disease

## 2024-03-12 VITALS — BP 116/53 | HR 51 | Ht 74.0 in | Wt 153.0 lb

## 2024-03-12 DIAGNOSIS — I480 Paroxysmal atrial fibrillation: Secondary | ICD-10-CM

## 2024-03-12 DIAGNOSIS — E782 Mixed hyperlipidemia: Secondary | ICD-10-CM | POA: Diagnosis not present

## 2024-03-12 DIAGNOSIS — I2510 Atherosclerotic heart disease of native coronary artery without angina pectoris: Secondary | ICD-10-CM | POA: Diagnosis not present

## 2024-03-12 NOTE — Patient Instructions (Signed)
 Medication Instructions:  Your physician recommends that you continue on your current medications as directed. Please refer to the Current Medication list given to you today.  *If you need a refill on your cardiac medications before your next appointment, please call your pharmacy*  Lab Work: If you have labs (blood work) drawn today and your tests are completely normal, you will receive your results only by: MyChart Message (if you have MyChart) OR A paper copy in the mail If you have any lab test that is abnormal or we need to change your treatment, we will call you to review the results.  Testing/Procedures: None ordered today.  Follow-Up: At [facility], you and your health needs are our priority.  As part of our continuing mission to provide you with exceptional heart care, our providers are all part of one team.  This team includes your primary Cardiologist (physician) and Advanced Practice Providers or APPs (Physician Assistants and Nurse Practitioners) who all work together to provide you with the care you need, when you need it.  Your next appointment:   6 months  Provider:   Charlton Haws, MD     We recommend signing up for the patient portal called "MyChart".  Sign up information is provided on this After Visit Summary.  MyChart is used to connect with patients for Virtual Visits (Telemedicine).  Patients are able to view lab/test results, encounter notes, upcoming appointments, etc.  Non-urgent messages can be sent to your provider as well.   To learn more about what you can do with MyChart, go to [email or web address].   Other Instructions       1st Floor: - Lobby - Registration  - Pharmacy  - Lab - Cafe  2nd Floor: - PV Lab - Diagnostic Testing (echo, CT, nuclear med)  3rd Floor: - Vacant  4th Floor: - TCTS (cardiothoracic surgery) - AFib Clinic - Structural Heart Clinic - Vascular Surgery  - Vascular Ultrasound  5th Floor: - HeartCare  Cardiology (general and EP) - Clinical Pharmacy for coumadin, hypertension, lipid, weight-loss medications, and med management appointments    Valet parking services will be available as well.

## 2024-03-13 DIAGNOSIS — R2689 Other abnormalities of gait and mobility: Secondary | ICD-10-CM | POA: Diagnosis not present

## 2024-03-13 DIAGNOSIS — M25552 Pain in left hip: Secondary | ICD-10-CM | POA: Diagnosis not present

## 2024-03-17 DIAGNOSIS — M25552 Pain in left hip: Secondary | ICD-10-CM | POA: Diagnosis not present

## 2024-03-17 DIAGNOSIS — R2689 Other abnormalities of gait and mobility: Secondary | ICD-10-CM | POA: Diagnosis not present

## 2024-03-19 DIAGNOSIS — M25552 Pain in left hip: Secondary | ICD-10-CM | POA: Diagnosis not present

## 2024-03-19 DIAGNOSIS — R2689 Other abnormalities of gait and mobility: Secondary | ICD-10-CM | POA: Diagnosis not present

## 2024-03-20 DIAGNOSIS — M25552 Pain in left hip: Secondary | ICD-10-CM | POA: Diagnosis not present

## 2024-03-20 DIAGNOSIS — R2689 Other abnormalities of gait and mobility: Secondary | ICD-10-CM | POA: Diagnosis not present

## 2024-03-24 DIAGNOSIS — M25552 Pain in left hip: Secondary | ICD-10-CM | POA: Diagnosis not present

## 2024-03-24 DIAGNOSIS — R2689 Other abnormalities of gait and mobility: Secondary | ICD-10-CM | POA: Diagnosis not present

## 2024-03-31 DIAGNOSIS — R2689 Other abnormalities of gait and mobility: Secondary | ICD-10-CM | POA: Diagnosis not present

## 2024-03-31 DIAGNOSIS — M25552 Pain in left hip: Secondary | ICD-10-CM | POA: Diagnosis not present

## 2024-04-03 DIAGNOSIS — R2689 Other abnormalities of gait and mobility: Secondary | ICD-10-CM | POA: Diagnosis not present

## 2024-04-03 DIAGNOSIS — M25552 Pain in left hip: Secondary | ICD-10-CM | POA: Diagnosis not present

## 2024-04-03 NOTE — Telephone Encounter (Signed)
 Prolia  VOB initiated via [email or web address]  Next Prolia  inj DUE: 04/30/24

## 2024-04-07 DIAGNOSIS — R2689 Other abnormalities of gait and mobility: Secondary | ICD-10-CM | POA: Diagnosis not present

## 2024-04-07 DIAGNOSIS — M25552 Pain in left hip: Secondary | ICD-10-CM | POA: Diagnosis not present

## 2024-04-10 DIAGNOSIS — M25552 Pain in left hip: Secondary | ICD-10-CM | POA: Diagnosis not present

## 2024-04-10 DIAGNOSIS — R2689 Other abnormalities of gait and mobility: Secondary | ICD-10-CM | POA: Diagnosis not present

## 2024-04-14 NOTE — Telephone Encounter (Signed)
 [employer] and Annette Stable - Prior Authorization REQUIRED for [employer]  PA PROCESS DETAILS: Please ensure your patient meets medical necessity by reviewing Medical Policy  [phone] at [email or web address], and obtaining a pre-determination by contacting the PA dept at [phone].

## 2024-04-19 NOTE — Telephone Encounter (Signed)
 [employer] and Raenette Bumps  Prior Authorization for PROLIA  APPROVED PA# [id number] Valid: 04/19/24-04/19/25

## 2024-04-19 NOTE — Telephone Encounter (Signed)
 [employer] and Raenette Bumps  Patient is ready for scheduling on or after 04/30/24  Out-of-pocket cost due at time of visit: $351.87  Primary: [facility] AARP Medicare Advantage HMO-POS Prolia  co-insurance: 20% (approximately $331.87) Admin fee co-insurance: $20  Deductible: does not apply  Prior Auth: APPROVED PA# [id number] Valid: 04/19/24-04/19/25  Secondary: N/A Prolia  co-insurance:  Admin fee co-insurance:  Deductible:  Prior Auth:  PA# Valid:   ** This summary of benefits is an estimation of the patient's out-of-pocket cost. Exact cost may vary based on individual plan coverage.

## 2024-05-02 ENCOUNTER — Ambulatory Visit

## 2024-05-02 DIAGNOSIS — M810 Age-related osteoporosis without current pathological fracture: Secondary | ICD-10-CM

## 2024-05-02 MED ORDER — DENOSUMAB 60 MG/ML ~~LOC~~ SOSY
60.0000 mg | PREFILLED_SYRINGE | Freq: Once | SUBCUTANEOUS | Status: AC
  Administered 2024-11-07: 60 mg via SUBCUTANEOUS

## 2024-05-02 NOTE — Progress Notes (Signed)
 Medication: Prolia  Dosage:60 mg Location: L Arm  Injection given ZO:[id number],WUJ Provider: Linford Ribas

## 2024-05-02 NOTE — Telephone Encounter (Signed)
 Last Prolia  inj 05/02/24 Next Prolia  inj due 11/02/24

## 2024-05-06 ENCOUNTER — Other Ambulatory Visit: Payer: Self-pay | Admitting: Physician Assistant

## 2024-06-03 ENCOUNTER — Telehealth: Payer: Self-pay | Admitting: Internal Medicine

## 2024-06-03 NOTE — Telephone Encounter (Signed)
 Copied from CRM [id number]. Topic: Medicare AWV >> Jun 03, 2024 11:38 AM Nathanel DEL wrote: Reason for CRM: LVM 06/03/2024 to schedule AWV. Please schedule Virtual or Telehealth visits ONLY.   Nathanel Paschal; Care Guide Ambulatory Clinical Support [place] [facility] Direct Dial: [phone]

## 2024-06-06 ENCOUNTER — Ambulatory Visit: Admitting: Podiatry

## 2024-06-06 ENCOUNTER — Encounter: Payer: Self-pay | Admitting: Podiatry

## 2024-06-06 DIAGNOSIS — Z7901 Long term (current) use of anticoagulants: Secondary | ICD-10-CM

## 2024-06-06 DIAGNOSIS — L84 Corns and callosities: Secondary | ICD-10-CM | POA: Diagnosis not present

## 2024-06-06 DIAGNOSIS — M79674 Pain in right toe(s): Secondary | ICD-10-CM | POA: Diagnosis not present

## 2024-06-06 DIAGNOSIS — B351 Tinea unguium: Secondary | ICD-10-CM

## 2024-06-06 DIAGNOSIS — M79675 Pain in left toe(s): Secondary | ICD-10-CM | POA: Diagnosis not present

## 2024-06-06 NOTE — Progress Notes (Signed)
  Subjective:  Patient ID: [name], male    DOB: 08-29-1930,   MRN: [id number]  Chief Complaint  Patient presents with   Nail Problem    I'm here for a three month check-up.    89 y.o. male presents for concern of callus on both feet and  of thickened elongated and painful nails that are difficult to trim. Requesting to have them trimmed today. He is on eliquis  and at risk for foot care  PCP:  Amon Aloysius BRAVO, MD    . Denies any other pedal complaints. Denies n/v/f/c.   Past Medical History:  Diagnosis Date   Anxiety    Atrial fibrillation (HCC)    Blood transfusion without reported diagnosis    CAD    a. 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 // Myoview  9/22: EF 57, normal perfusion; low risk   Cataract    Chronic abdominal pain    Chronic nausea    Coronary artery disease    Diverticulosis 2011   Diverticulitis 2004   Dyspepsia    Esophageal reflux    GERD (gastroesophageal reflux disease)    Headache    saw neuro 4-16, MRI done   History of echocardiogram    Echo 9/22: EF 60-65, no RWMA, normal diastolic function, GLS -24.9, normal RVSF, normal PASP, mild-moderate BAE, trivial MR, moderate MAC, moderate AV calcification, mild AI, mild aortic stenosis (mean gradient 5 mmHg, V-max 158 cm/s, DI 0.62), mild dilation of ascending aorta (40 mm)   HYPERPLASIA, PROSTATE NOS W/URINARY OBST/LUTS    IBS    INGUINAL HERNIA    Lichen planus 2006   Niels Hoover MD   LUMBAR RADICULOPATHY, RIGHT    Mixed hyperlipidemia    OSTEOPOROSIS    PONV (postoperative nausea and vomiting)    PREMATURE VENTRICULAR CONTRACTIONS    a. After CABG - was on Coumadin/Multaq  for a period of time. Coumadin discontinued 04/2010 after maintaining NSR.   UNSPECIFIED ANEMIA     Objective:  Physical Exam: Vascular: DP/PT pulses 2/4 bilateral. CFT <3 seconds. Absent hair growth on digits. Edema noted to bilateral lower extremities.  Xerosis noted bilaterally.  Skin. No lacerations or abrasions bilateral feet. Nails 1-5 bilateral  are thickened discolored and elongated with subungual debris. Hyperkeratotic lesion noted sub first metatarsal head bilateral and left fifth metatarsal head Musculoskeletal: MMT 5/5 bilateral lower extremities in DF, PF, Inversion and Eversion. Deceased ROM in DF of ankle joint.  Neurological: Sensation intact to light touch. Protective sensation intact bilateral.    Assessment:   1. Pain due to onychomycosis of toenails of both feet   2. Chronic anticoagulation   3. Pre-ulcerative calluses      Plan:  Patient was evaluated and treated and all questions answered. -Discussed and educated patient on foot care, especially with  regards to the vascular, neurological and musculoskeletal systems.  -Hyperkeratotic tissue noted sub first metatarsal bilateral and left fifth metatarsal head debrided without incident with chisel.  -Mechanically debrided all nails 1-5 bilateral using sterile nail nipper and filed with dremel without incident  -Answered all patient questions -Patient to return  in 3 months for at risk foot care -Patient advised to call the office if any problems or questions arise in the meantime.   Asberry Failing, DPM

## 2024-07-11 DIAGNOSIS — M25552 Pain in left hip: Secondary | ICD-10-CM | POA: Diagnosis not present

## 2024-07-15 ENCOUNTER — Ambulatory Visit (INDEPENDENT_AMBULATORY_CARE_PROVIDER_SITE_OTHER): Admitting: Internal Medicine

## 2024-07-15 ENCOUNTER — Encounter: Payer: Self-pay | Admitting: Internal Medicine

## 2024-07-15 VITALS — BP 136/58 | HR 59 | Temp 98.0°F | Resp 16 | Ht 74.0 in | Wt 153.5 lb

## 2024-07-15 DIAGNOSIS — Z7185 Encounter for immunization safety counseling: Secondary | ICD-10-CM

## 2024-07-15 DIAGNOSIS — R739 Hyperglycemia, unspecified: Secondary | ICD-10-CM

## 2024-07-15 DIAGNOSIS — E782 Mixed hyperlipidemia: Secondary | ICD-10-CM | POA: Diagnosis not present

## 2024-07-15 DIAGNOSIS — J410 Simple chronic bronchitis: Secondary | ICD-10-CM

## 2024-07-15 DIAGNOSIS — Z0001 Encounter for general adult medical examination with abnormal findings: Secondary | ICD-10-CM | POA: Diagnosis not present

## 2024-07-15 DIAGNOSIS — M810 Age-related osteoporosis without current pathological fracture: Secondary | ICD-10-CM

## 2024-07-15 DIAGNOSIS — I25810 Atherosclerosis of coronary artery bypass graft(s) without angina pectoris: Secondary | ICD-10-CM | POA: Diagnosis not present

## 2024-07-15 DIAGNOSIS — Z23 Encounter for immunization: Secondary | ICD-10-CM

## 2024-07-15 DIAGNOSIS — Z0000 Encounter for general adult medical examination without abnormal findings: Secondary | ICD-10-CM | POA: Diagnosis not present

## 2024-07-15 DIAGNOSIS — M25552 Pain in left hip: Secondary | ICD-10-CM

## 2024-07-15 DIAGNOSIS — I4819 Other persistent atrial fibrillation: Secondary | ICD-10-CM | POA: Diagnosis not present

## 2024-07-15 DIAGNOSIS — F339 Major depressive disorder, recurrent, unspecified: Secondary | ICD-10-CM

## 2024-07-15 LAB — LIPID PANEL
Cholesterol: 118 mg/dL (ref 0–200)
HDL: 68.4 mg/dL (ref >39.00)
LDL Cholesterol: 29 mg/dL (ref 0–99)
NonHDL: 49.45
Total CHOL/HDL Ratio: 2
Triglycerides: 102 mg/dL (ref 0.0–149.0)
VLDL: 20.4 mg/dL (ref 0.0–40.0)

## 2024-07-15 LAB — ALT: ALT: 18 U/L (ref 0–53)

## 2024-07-15 LAB — BASIC METABOLIC PANEL WITH GFR
BUN: 24 mg/dL — ABNORMAL HIGH (ref 6–23)
CO2: 31 meq/L (ref 19–32)
Calcium: 9.5 mg/dL (ref 8.4–10.5)
Chloride: 99 meq/L (ref 96–112)
Creatinine, Ser: 0.96 mg/dL (ref 0.40–1.50)
GFR: 68.96 mL/min (ref >60.00)
Glucose, Bld: 94 mg/dL (ref 70–99)
Potassium: 5.7 meq/L — ABNORMAL HIGH (ref 3.5–5.1)
Sodium: 137 meq/L (ref 135–145)

## 2024-07-15 LAB — AST: AST: 24 U/L (ref 0–37)

## 2024-07-15 LAB — HEMOGLOBIN A1C: Hgb A1c MFr Bld: 6.2 % (ref 4.6–6.5)

## 2024-07-15 NOTE — Progress Notes (Unsigned)
 Subjective:    Patient ID: [name], male    DOB: 02-Jul-1930, 89 y.o.   MRN: [id number]  DOS:  07/15/2024 Type of visit - description: CPX Here for CPX Chronic medical problems addressed. Also, developed bilateral hip pain, went to the ER, CT hips nonacute. Saw orthopedics, got a local injection at the left hip and physical therapy.  Got somewhat better but symptoms resurface about 2 weeks ago. Has seen orthopedics again.  No chest pain or difficulty breathing No nausea or vomiting. No GU symptoms. Emotionally feeling great.    Review of Systems See above   Past Medical History:  Diagnosis Date   Anxiety    Atrial fibrillation (HCC)    Blood transfusion without reported diagnosis    CAD    a. 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 // Myoview  9/22: EF 57, normal perfusion; low risk   Cataract    Chronic abdominal pain    Chronic nausea    Coronary artery disease    Diverticulosis 2011   Diverticulitis 2004   Dyspepsia    Esophageal reflux    GERD (gastroesophageal reflux disease)    Headache    saw neuro 4-16, MRI done   History of echocardiogram    Echo 9/22: EF 60-65, no RWMA, normal diastolic function, GLS -24.9, normal RVSF, normal PASP, mild-moderate BAE, trivial MR, moderate MAC, moderate AV calcification, mild AI, mild aortic stenosis (mean gradient 5 mmHg, V-max 158 cm/s, DI 0.62), mild dilation of ascending aorta (40 mm)   HYPERPLASIA, PROSTATE NOS W/URINARY OBST/LUTS    IBS    INGUINAL HERNIA    Lichen planus 2006   Niels Hoover MD   LUMBAR RADICULOPATHY, RIGHT    Mixed hyperlipidemia    OSTEOPOROSIS    PONV (postoperative nausea and vomiting)    PREMATURE VENTRICULAR CONTRACTIONS    a. After CABG - was on Coumadin/Multaq  for a period of time. Coumadin discontinued 04/2010 after maintaining NSR.   UNSPECIFIED ANEMIA     Past Surgical History:  Procedure Laterality Date    BIOPSY  09/10/2018   Procedure: BIOPSY;  Surgeon: Albertus Gordy HERO, MD;  Location: [facility];  Service: Gastroenterology;;   CARDIOVERSION N/A 08/31/2023   Procedure: CARDIOVERSION;  Surgeon: Michele Richardson, DO;  Location: [facility];  Service: Cardiovascular;  Laterality: N/A;   CATARACT EXTRACTION Right 10/06/2013   CORONARY ARTERY BYPASS GRAFT     2 VD;anomalous RCA;post op compliacted by AF   ESOPHAGOGASTRODUODENOSCOPY (EGD) WITH PROPOFOL  N/A 09/10/2018   Procedure: ESOPHAGOGASTRODUODENOSCOPY (EGD) WITH PROPOFOL ;  Surgeon: Albertus Gordy HERO, MD;  Location: [facility];  Service: Gastroenterology;  Laterality: N/A;   HIP FRACTURE SURGERY Right 03/2007   Dr. Rollie   INGUINAL HERNIA REPAIR Left 2012   Dr Vanderbilt   KNEE SURGERY Right 1977   repair   PROSTATE BIOPSY  09/19/2013   [facility]    Current Outpatient Medications  Medication Instructions   acetaminophen  (TYLENOL ) 325-650 mg, Every 8 hours PRN   amiodarone  (PACERONE ) 200 mg, Oral, Daily   calcium  carbonate (TUMS - DOSED IN MG ELEMENTAL CALCIUM ) 500 mg, Daily   denosumab  (PROLIA ) 60 mg, Every 6 months   docusate sodium  (COLACE) 100 mg, Oral, 2 times daily   Eliquis  5 mg, Oral, 2 times daily   famotidine -calcium  carbonate-magnesium hydroxide (PEPCID  COMPLETE) 10-800-165 MG chewable tablet 1 tablet, Daily PRN   folic acid  (  FOLVITE ) 1 mg, Oral, Daily   Glycerin-Hypromellose-PEG 400 (DRY EYE RELIEF DROPS) 0.2-0.2-1 % SOLN 1 drop, Every morning   Multiple Vitamin (MULITIVITAMIN WITH MINERALS) TABS 1 tablet, Daily   nitroGLYCERIN  (NITROSTAT ) 0.4 MG SL tablet DISSOLVE 1 TABLET UNDER TONGUE EVERY 5 MINUTES FOR UP TO 3 DOSES AS NEEDED FOR CHEST PAIN   rosuvastatin  (CRESTOR ) 10 mg, Oral, Daily   Vitamin D -3 1,000 Units, Daily with breakfast       Objective:   Physical Exam BP (!) 136/58   Pulse (!) 59   Temp 98 F (36.7 C) (Oral)   Resp 16   Ht 6' 2 (1.88 m)   Wt 153 lb 8 oz (69.6 kg)   SpO2 95%   BMI 19.71  kg/m  General: Well developed, NAD, BMI noted Neck: No  thyromegaly  HEENT:  Normocephalic . Face symmetric, atraumatic Lungs:  CTA B Normal respiratory effort, no intercostal retractions, no accessory muscle use. Heart: Bradycardic, seems regular.  Abdomen:  Not distended, soft, non-tender. No rebound or rigidity.   Lower extremities: no pretibial edema bilaterally.  Strong pedal pulses noted, good capillary refill throughout Skin: Exposed areas without rash. Not pale. Not jaundice Neurologic:  alert & oriented X3.  Speech normal, gait and transferring limited, uses a walker. Strength symmetric and appropriate for age.  Psych: Cognition and judgment appear intact.  Cooperative with normal attention span and concentration.  Behavior appropriate. No anxious or depressed appearing.     Assessment     Problem list Prediabetes, A1c 6.0 (2015) Hyperlipidemia Anxiety, depression:  -intol to zoloft , Rx lexapro  5 mg 07-2015: intolerant d/t nausea  -lost wife July 2017 CV: ---CAD--CABG 2011 ----P- Atrial fibrillation, PVCs.  Elliquis ; Multaq  d/c d/t GI s/e ----Palpable Ao x years, US  (-) for AAA 2005 COPD (per CXR 06-2015), PFTs 10-04-15 mild obstruction DJD  Gait-- uses a walker prn GI: --GERD, IBS, diverticulosis --Chronic nausea: 2019.  Work-up: 08-2018: Negative EGD, CT  and US  abdomen. 12/2018 wnl Gastric empty stomach.  Symptoms decreased after Multaq  stopped GU: elevated PSA, BPH , (-) bx 2014 Osteoporosis:   (per ENDO) -hip FX 2008 - dexa ~2005 --> rx fosamax , took x 1 year, dexa ~2010 was rec no fosamax  at that time. Dexa 09-2015: T score -2.4 --> rx fosamax , d/c d/t jaw pain 06-2017; took prolia  #25 February 2018; T score (-) 30 December 2018  Lichen planusHeadache -- saw neurology 02-2015, had a MRI/MRA (-) COPD: PFTs 08-2020: Mild obstruction with some air trapping.  Insignificant response to bronchodilators.   PLAN Here for CPX - Tdap 2023 -PNA: 2016, 2022; prevnar  2016; PNM 20: today - s/p shingrix  x2; s/p RSV per pt  - Vaccines I recommend: Flu shot, COVID booster. -CCS, prostate ca screening: no further screenings   - Exercise: Limited, using a walker.  Trying to stay as active as possible, praised! - has a healthcare POA. - Labs reviewed, check a BMP, AST ALT FLP A1c  Other issues: Hyperglycemia: Check A1c. Hyperlipidemia: On rosuvastatin , check LFTs and FLP. CAD, CABG, paroxysmal A-fib: 03/12/2024, saw cardiology.  Felt to be stable, next in 1 year.  Anticoagulated without apparent complications. Hip pain: See HPI, having hip pain for few months, CT hips no acute, Ortho recommended left hip injection which was done and physical therapy.  Got better but symptoms have resurfaced.  Now he is trying to decide if he needs another left hip injection which could be completely appropriate. Vascular exam of the legs  normal. RTC 6 months

## 2024-07-15 NOTE — Patient Instructions (Addendum)
 Vaccines I recommend: Flu shot COVID booster   GO TO THE LAB :  Get the blood work   Your results will be posted on MyChart with my comments  Go to the front desk for the checkout Please make an appointment for a checkup in 6 months

## 2024-07-16 ENCOUNTER — Telehealth: Payer: Self-pay | Admitting: Internal Medicine

## 2024-07-16 ENCOUNTER — Encounter: Payer: Self-pay | Admitting: Internal Medicine

## 2024-07-16 ENCOUNTER — Ambulatory Visit: Payer: Self-pay | Admitting: Internal Medicine

## 2024-07-16 DIAGNOSIS — E875 Hyperkalemia: Secondary | ICD-10-CM

## 2024-07-16 DIAGNOSIS — L89152 Pressure ulcer of sacral region, stage 2: Secondary | ICD-10-CM | POA: Insufficient documentation

## 2024-07-16 NOTE — Assessment & Plan Note (Signed)
 Here for CPX - Tdap 2023 -PNA: 2016, 2022; prevnar 2016; PNM 20: today - s/p shingrix  x2; s/p RSV per pt  - Vaccines I recommend: Flu shot, COVID booster. -CCS, prostate ca screening: no further screenings   - Exercise: Limited, using a walker.  Trying to stay as active as possible, praised! - has a healthcare POA. - Labs reviewed, check a BMP, AST ALT FLP A1c

## 2024-07-16 NOTE — Telephone Encounter (Signed)
 LMOM asking for call back. Okay for E2C2 to discuss. Will need lab appt for Friday, 07/18/24. BMP ordered.

## 2024-07-16 NOTE — Assessment & Plan Note (Signed)
 Here for CPX   Other issues: Hyperglycemia: Check A1c. Hyperlipidemia: On rosuvastatin , check LFTs and FLP. CAD, CABG, paroxysmal A-fib: 03/12/2024, saw cardiology.  Felt to be stable, next in 1 year.  Anticoagulated without apparent complications. Hip pain: See HPI, having hip pain for few months, CT hips no acute, Ortho recommended left hip injection which was done and physical therapy.  Got better but symptoms have resurfaced.  Now he is trying to decide if he needs another left hip injection which is  completely appropriate. Vascular exam of the legs normal. RTC 6 months

## 2024-07-16 NOTE — Telephone Encounter (Signed)
 Lab appt scheduled.

## 2024-07-16 NOTE — Telephone Encounter (Signed)
 Call patient. Labs okay except for increased potassium. - Come back for BMP in the next 48 hours.  Dx hyperkalemia -Advise lab to draw blood w/o tourniquet. -Also advised patient to stop potassium supplement if he is taking any. - Avoid foods rich in potassium such as bananas, sweet potato, avocado, spinach

## 2024-07-17 DIAGNOSIS — M1612 Unilateral primary osteoarthritis, left hip: Secondary | ICD-10-CM | POA: Diagnosis not present

## 2024-07-18 ENCOUNTER — Other Ambulatory Visit (INDEPENDENT_AMBULATORY_CARE_PROVIDER_SITE_OTHER)

## 2024-07-18 DIAGNOSIS — E875 Hyperkalemia: Secondary | ICD-10-CM | POA: Diagnosis not present

## 2024-07-18 LAB — BASIC METABOLIC PANEL WITH GFR
BUN: 27 mg/dL — ABNORMAL HIGH (ref 6–23)
CO2: 28 meq/L (ref 19–32)
Calcium: 9.4 mg/dL (ref 8.4–10.5)
Chloride: 99 meq/L (ref 96–112)
Creatinine, Ser: 0.93 mg/dL (ref 0.40–1.50)
GFR: 71.64 mL/min (ref >60.00)
Glucose, Bld: 107 mg/dL — ABNORMAL HIGH (ref 70–99)
Potassium: 5.3 meq/L — ABNORMAL HIGH (ref 3.5–5.1)
Sodium: 136 meq/L (ref 135–145)

## 2024-07-21 ENCOUNTER — Ambulatory Visit: Payer: Self-pay | Admitting: Internal Medicine

## 2024-07-21 DIAGNOSIS — E875 Hyperkalemia: Secondary | ICD-10-CM

## 2024-07-22 DIAGNOSIS — M6281 Muscle weakness (generalized): Secondary | ICD-10-CM | POA: Diagnosis not present

## 2024-07-22 DIAGNOSIS — M1612 Unilateral primary osteoarthritis, left hip: Secondary | ICD-10-CM | POA: Diagnosis not present

## 2024-07-22 DIAGNOSIS — R2681 Unsteadiness on feet: Secondary | ICD-10-CM | POA: Diagnosis not present

## 2024-07-24 DIAGNOSIS — M6281 Muscle weakness (generalized): Secondary | ICD-10-CM | POA: Diagnosis not present

## 2024-07-24 DIAGNOSIS — M1612 Unilateral primary osteoarthritis, left hip: Secondary | ICD-10-CM | POA: Diagnosis not present

## 2024-07-24 DIAGNOSIS — R2681 Unsteadiness on feet: Secondary | ICD-10-CM | POA: Diagnosis not present

## 2024-07-28 DIAGNOSIS — R2681 Unsteadiness on feet: Secondary | ICD-10-CM | POA: Diagnosis not present

## 2024-07-28 DIAGNOSIS — M1612 Unilateral primary osteoarthritis, left hip: Secondary | ICD-10-CM | POA: Diagnosis not present

## 2024-07-28 DIAGNOSIS — M6281 Muscle weakness (generalized): Secondary | ICD-10-CM | POA: Diagnosis not present

## 2024-07-30 ENCOUNTER — Ambulatory Visit (HOSPITAL_COMMUNITY)
Admission: RE | Admit: 2024-07-30 | Discharge: 2024-07-30 | Disposition: A | Discharge status: Home / Self Care | Source: Ambulatory Visit | Attending: Internal Medicine | Admitting: Internal Medicine

## 2024-07-30 DIAGNOSIS — I350 Nonrheumatic aortic (valve) stenosis: Secondary | ICD-10-CM | POA: Diagnosis not present

## 2024-07-30 LAB — ECHOCARDIOGRAM COMPLETE
AR max vel: 1.67 cm2
AV Area VTI: 1.39 cm2
AV Area mean vel: 1.46 cm2
AV Mean grad: 15 mmHg
AV Peak grad: 22.5 mmHg
Ao pk vel: 2.37 m/s
Area-P 1/2: 2.45 cm2
S' Lateral: 2.9 cm

## 2024-07-31 ENCOUNTER — Ambulatory Visit: Payer: Self-pay | Admitting: Physician Assistant

## 2024-07-31 DIAGNOSIS — R2681 Unsteadiness on feet: Secondary | ICD-10-CM | POA: Diagnosis not present

## 2024-07-31 DIAGNOSIS — M6281 Muscle weakness (generalized): Secondary | ICD-10-CM | POA: Diagnosis not present

## 2024-07-31 DIAGNOSIS — M1612 Unilateral primary osteoarthritis, left hip: Secondary | ICD-10-CM | POA: Diagnosis not present

## 2024-07-31 DIAGNOSIS — I350 Nonrheumatic aortic (valve) stenosis: Secondary | ICD-10-CM

## 2024-08-04 ENCOUNTER — Other Ambulatory Visit: Payer: Self-pay | Admitting: Internal Medicine

## 2024-08-04 ENCOUNTER — Other Ambulatory Visit: Payer: Self-pay | Admitting: Physician Assistant

## 2024-08-05 ENCOUNTER — Ambulatory Visit (INDEPENDENT_AMBULATORY_CARE_PROVIDER_SITE_OTHER)

## 2024-08-05 VITALS — Ht 74.0 in | Wt 153.0 lb

## 2024-08-05 DIAGNOSIS — M1612 Unilateral primary osteoarthritis, left hip: Secondary | ICD-10-CM | POA: Diagnosis not present

## 2024-08-05 DIAGNOSIS — Z0000 Encounter for general adult medical examination without abnormal findings: Secondary | ICD-10-CM | POA: Diagnosis not present

## 2024-08-05 DIAGNOSIS — M6281 Muscle weakness (generalized): Secondary | ICD-10-CM | POA: Diagnosis not present

## 2024-08-05 DIAGNOSIS — R2681 Unsteadiness on feet: Secondary | ICD-10-CM | POA: Diagnosis not present

## 2024-08-05 NOTE — Progress Notes (Signed)
 Subjective:   [name] is a 89 y.o. who presents for a Medicare Wellness preventive visit.  As a reminder, Annual Wellness Visits don't include a physical exam, and some assessments may be limited, especially if this visit is performed virtually. We may recommend an in-person follow-up visit with your provider if needed.  Visit Complete: Virtual I connected with  [name] on 08/05/24 by a audio enabled telemedicine application and verified that I am speaking with the correct person using two identifiers.  Patient Location: Home  Provider Location: Home Office  I discussed the limitations of evaluation and management by telemedicine. The patient expressed understanding and agreed to proceed.  Vital Signs: Because this visit was a virtual/telehealth visit, some criteria may be missing or patient reported. Any vitals not documented were not able to be obtained and vitals that have been documented are patient reported.  VideoDeclined- This patient declined Librarian, academic. Therefore the visit was completed with audio only.  Persons Participating in Visit: Patient.  AWV Questionnaire: No: Patient Medicare AWV questionnaire was not completed prior to this visit.  Cardiac Risk Factors include: advanced age (>62men, >11 women);dyslipidemia;male gender;hypertension     Objective:    Today's Vitals   08/05/24 0921  Weight: 153 lb (69.4 kg)  Height: 6' 2 (1.88 m)   Body mass index is 19.64 kg/m.     08/05/2024    9:28 AM 08/31/2023    7:33 AM 08/01/2023   10:46 AM 07/25/2022    1:17 PM 06/20/2022    6:11 AM 07/23/2021    2:59 PM 07/20/2021    3:07 PM  Advanced Directives  Does Patient Have a Medical Advance Directive? Yes No Yes Yes Yes No Yes  Type of Estate agent of [place];Living will  Healthcare Power of [place];Living will Healthcare Power of [place];Living will Living will  Living will  Does patient want to make  changes to medical advance directive?   No - Patient declined No - Patient declined     Copy of Healthcare Power of Attorney in Chart? No - copy requested  No - copy requested No - copy requested     Would patient like information on creating a medical advance directive?      No - Patient declined     Current Medications (verified) Outpatient Encounter Medications as of 08/05/2024  Medication Sig   acetaminophen  (TYLENOL ) 650 MG CR tablet Take 325-650 mg by mouth every 8 (eight) hours as needed for pain (Arthritis).   amiodarone  (PACERONE ) 200 MG tablet Take 1 tablet (200 mg total) by mouth daily.   calcium  carbonate (TUMS - DOSED IN MG ELEMENTAL CALCIUM ) 500 MG chewable tablet Chew 500 mg by mouth daily.   Cholecalciferol  (VITAMIN D -3) 25 MCG (1000 UT) CAPS Take 1,000 Units by mouth daily with breakfast.   denosumab  (PROLIA ) 60 MG/ML SOSY injection Inject 60 mg into the skin every 6 (six) months.   docusate sodium  (COLACE) 100 MG capsule Take 1 capsule (100 mg total) by mouth 2 (two) times daily.   ELIQUIS  5 MG TABS tablet TAKE 1 TABLET (5 MG TOTAL) BY MOUTH 2 (TWO) TIMES DAILY.   famotidine -calcium  carbonate-magnesium hydroxide (PEPCID  COMPLETE) 10-800-165 MG chewable tablet Chew 1 tablet by mouth daily as needed (indigestion).   folic acid  (FOLVITE ) 1 MG tablet TAKE 1 TABLET (1 MG TOTAL) BY MOUTH DAILY.   Glycerin-Hypromellose-PEG 400 (DRY EYE RELIEF DROPS) 0.2-0.2-1 % SOLN Place 1 drop into both eyes  every morning. Lubricating eyes   Multiple Vitamin (MULITIVITAMIN WITH MINERALS) TABS Take 1 tablet by mouth daily.   nitroGLYCERIN  (NITROSTAT ) 0.4 MG SL tablet DISSOLVE 1 TABLET UNDER TONGUE EVERY 5 MINUTES FOR UP TO 3 DOSES AS NEEDED FOR CHEST PAIN   rosuvastatin  (CRESTOR ) 10 MG tablet Take 1 tablet (10 mg total) by mouth daily.   Facility-Administered Encounter Medications as of 08/05/2024  Medication   [START ON 11/01/2024] denosumab  (PROLIA ) injection 60 mg    Allergies  (verified) Amoxicillin , Zoloft  [sertraline  hcl], and Remeron  [mirtazapine ]   History: Past Medical History:  Diagnosis Date   Anxiety    Atrial fibrillation (HCC)    Blood transfusion without reported diagnosis    CAD    a. 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 // Myoview  9/22: EF 57, normal perfusion; low risk   Cataract    Chronic abdominal pain    Chronic nausea    Coronary artery disease    Diverticulosis 2011   Diverticulitis 2004   Dyspepsia    Esophageal reflux    GERD (gastroesophageal reflux disease)    Headache    saw neuro 4-16, MRI done   History of echocardiogram    Echo 9/22: EF 60-65, no RWMA, normal diastolic function, GLS -24.9, normal RVSF, normal PASP, mild-moderate BAE, trivial MR, moderate MAC, moderate AV calcification, mild AI, mild aortic stenosis (mean gradient 5 mmHg, V-max 158 cm/s, DI 0.62), mild dilation of ascending aorta (40 mm)   HYPERPLASIA, PROSTATE NOS W/URINARY OBST/LUTS    IBS    INGUINAL HERNIA    Lichen planus 2006   Niels Hoover MD   LUMBAR RADICULOPATHY, RIGHT    Mixed hyperlipidemia    OSTEOPOROSIS    PONV (postoperative nausea and vomiting)    PREMATURE VENTRICULAR CONTRACTIONS    a. After CABG - was on Coumadin/Multaq  for a period of time. Coumadin discontinued 04/2010 after maintaining NSR.   UNSPECIFIED ANEMIA    Past Surgical History:  Procedure Laterality Date   BIOPSY  09/10/2018   Procedure: BIOPSY;  Surgeon: Albertus Gordy HERO, MD;  Location: [facility];  Service: Gastroenterology;;   CARDIOVERSION N/A 08/31/2023   Procedure: CARDIOVERSION;  Surgeon: Michele Richardson, DO;  Location: [facility];  Service: Cardiovascular;  Laterality: N/A;   CATARACT EXTRACTION Right 10/06/2013   CORONARY ARTERY BYPASS GRAFT     2 VD;anomalous RCA;post op compliacted by AF   ESOPHAGOGASTRODUODENOSCOPY (EGD) WITH PROPOFOL  N/A 09/10/2018   Procedure:  ESOPHAGOGASTRODUODENOSCOPY (EGD) WITH PROPOFOL ;  Surgeon: Albertus Gordy HERO, MD;  Location: [facility];  Service: Gastroenterology;  Laterality: N/A;   HIP FRACTURE SURGERY Right 03/2007   Dr. Rollie   INGUINAL HERNIA REPAIR Left 2012   Dr Vanderbilt   KNEE SURGERY Right 1977   repair   PROSTATE BIOPSY  09/19/2013   [facility]   Family History  Problem Relation Age of Onset   Stroke Mother    Pancreatic cancer Father        Deceased, 85   Breast cancer Sister        Deceased   Heart disease Sister 33   Stroke Maternal Aunt    Colon cancer Neg Hx    Prostate cancer Neg Hx    Esophageal cancer Neg Hx    Rectal cancer Neg Hx    Stomach cancer Neg Hx    Social History   Socioeconomic History   Marital  status: Legally Separated    Spouse name: Not on file   Number of children: 0   Years of education: Not on file   Highest education level: Not on file  Occupational History   Occupation: retired    Associate Professor: RETIRED  Tobacco Use   Smoking status: Former    Current packs/day: 0.00    Types: Cigarettes    Quit date: 11/27/1962    Years since quitting: 61.7   Smokeless tobacco: Never  Vaping Use   Vaping status: Never Used  Substance and Sexual Activity   Alcohol use: Not Currently    Alcohol/week: 0.0 standard drinks of alcohol    Comment: quit drinking    Drug use: No   Sexual activity: Not Currently  Other Topics Concern   Not on file  Social History Narrative   Lives at [facility] in independent living.     Lost wife 06-2016.     Has no children.  Family: nephews-nices in [place]   Retired from [employer].     Still drives sometimes.    Social Drivers of Corporate investment banker Strain: Low Risk  (08/05/2024)   Overall Financial Resource Strain (CARDIA)    Difficulty of Paying Living Expenses: Not hard at all  Food Insecurity: No Food Insecurity (08/05/2024)   Hunger Vital Sign    Worried About Running Out of Food in the Last Year: Never true    Ran  Out of Food in the Last Year: Never true  Transportation Needs: No Transportation Needs (08/05/2024)   PRAPARE - Administrator, Civil Service (Medical): No    Lack of Transportation (Non-Medical): No  Physical Activity: Insufficiently Active (08/05/2024)   Exercise Vital Sign    Days of Exercise per Week: 3 days    Minutes of Exercise per Session: 30 min  Stress: No Stress Concern Present (08/05/2024)   [employer] of Occupational Health - Occupational Stress Questionnaire    Feeling of Stress: Not at all  Social Connections: Moderately Integrated (08/05/2024)   Social Connection and Isolation Panel    Frequency of Communication with Friends and Family: Once a week    Frequency of Social Gatherings with Friends and Family: Twice a week    Attends Religious Services: More than 4 times per year    Active Member of [employer] or Organizations: Yes    Attends Banker Meetings: More than 4 times per year    Marital Status: Widowed    Tobacco Counseling Counseling given: Not Answered    Clinical Intake:  Pre-visit preparation completed: Yes  Pain : No/denies pain     Diabetes: No  Lab Results  Component Value Date   HGBA1C 6.2 07/15/2024   HGBA1C 6.0 05/22/2023   HGBA1C 5.9 09/07/2022     How often do you need to have someone help you when you read instructions, pamphlets, or other written materials from your doctor or pharmacy?: 1 - Never  Interpreter Needed?: No  Information entered by :: Charmaine Bloodgood LPN   Activities of Daily Living     08/05/2024    9:22 AM 07/15/2024   10:35 AM  In your present state of health, do you have any difficulty performing the following activities:  Hearing? 0 0  Vision? 0 0  Difficulty concentrating or making decisions? 0 0  Walking or climbing stairs? 1 1  Dressing or bathing? 0 0  Doing errands, shopping? 1 1  Preparing Food and eating ?  N   Using the Toilet? N   In the past six months, have you accidently  leaked urine? N   Do you have problems with loss of bowel control? N   Managing your Medications? Y   Managing your Finances? Y   Housekeeping or managing your Housekeeping? Y     Patient Care Team: Amon Aloysius BRAVO, MD as PCP - General (Internal Medicine) Delford Maude BROCKS, MD as PCP - Cardiology (Cardiology) Gaston Hamilton, MD as Consulting Physician (Urology) Delford Maude BROCKS, MD as Consulting Physician (Cardiology) Court Pulling, MD as Consulting Physician (Dermatology) Neysa Reggy BIRCH, MD as Consulting Physician (Pulmonary Disease) Tobie Tonita POUR, DO as Consulting Physician (Neurology) Debrah Lamar BIRCH, MD (Inactive) as Consulting Physician (Gastroenterology) Octavia Lamar, MD as Consulting Physician (Ophthalmology) Sheril Maude, MD as Consulting Physician (Orthopedic Surgery)  I have updated your Care Teams any recent Medical Services you may have received from other providers in the past year.     Assessment:   This is a routine wellness examination for Ramirez.  Hearing/Vision screen Hearing Screening - Comments:: Mild hearing loss  Vision Screening - Comments:: Wears rx glasses - up to date with routine eye exams with [facility]   Goals Addressed               This Visit's Progress     Maintain current health (pt-stated)   On track      Depression Screen     08/05/2024    9:27 AM 07/15/2024   11:13 AM 03/04/2024   10:02 AM 12/03/2023   10:27 AM 08/01/2023   10:58 AM 05/22/2023   11:05 AM 11/22/2022   11:35 AM  PHQ 2/9 Scores  PHQ - 2 Score 0 0 0 0 1 1 1   PHQ- 9 Score 1 1  1  2      Fall Risk     08/05/2024    9:31 AM 07/15/2024   11:13 AM 03/04/2024   10:02 AM 12/03/2023   10:06 AM 07/25/2023   10:03 AM  Fall Risk   Falls in the past year? 0 0 0 1 0  Number falls in past yr: 0 0 0 0 0  Injury with Fall? 0 0 0 1 0  Risk for fall due to : Impaired mobility;Impaired balance/gait;Orthopedic patient    No Fall Risks  Follow up Falls prevention  discussed;Education provided;Falls evaluation completed Falls evaluation completed;Education provided Education provided;Falls evaluation completed Falls evaluation completed;Education provided Falls evaluation completed    MEDICARE RISK AT HOME:  Medicare Risk at Home Any stairs in or around the home?: No If so, are there any without handrails?: No Home free of loose throw rugs in walkways, pet beds, electrical cords, etc?: Yes Adequate lighting in your home to reduce risk of falls?: Yes Life alert?: No Use of a cane, walker or w/c?: Yes Grab bars in the bathroom?: Yes Shower chair or bench in shower?: Yes Elevated toilet seat or a handicapped toilet?: Yes  TIMED UP AND GO:  Was the test performed?  No  Cognitive Function: 6CIT completed    08/21/2017    1:35 PM  MMSE - Mini Mental State Exam  Orientation to time 5   Orientation to Place 5   Registration 3   Attention/ Calculation 5   Recall 3   Language- name 2 objects 2   Language- repeat 1  Language- follow 3 step command 3   Language- read & follow direction 1   Write  a sentence 1   Copy design 1   Total score 30      Data saved with a previous flowsheet row definition        08/05/2024    9:31 AM 08/01/2023   11:00 AM 07/25/2022    1:30 PM  6CIT Screen  What Year? 0 points 0 points 0 points  What month? 0 points 0 points 0 points  What time? 0 points 0 points 0 points  Count back from 20 0 points 0 points 4 points  Months in reverse 0 points 0 points 0 points  Repeat phrase 0 points 0 points 0 points  Total Score 0 points 0 points 4 points    Immunizations Immunization History  Administered Date(s) Administered   INFLUENZA, HIGH DOSE SEASONAL PF 08/20/2018, 10/02/2019, 08/30/2020, 08/29/2021, 09/20/2022   Influenza Split 08/28/2023   Influenza Whole 09/05/2007, 08/13/2009, 08/27/2012   Influenza,inj,Quad PF,6+ Mos 08/25/2016   Influenza-Unspecified 08/26/2013, 08/28/2015, 08/28/2017   Moderna Covid-19  Fall Seasonal Vaccine 32yrs & older 10/04/2022   Moderna Sars-Covid-2 Vaccination 12/10/2019, 02/05/2020, 09/29/2020   PNEUMOCOCCAL CONJUGATE-20 07/15/2024   PPD Test 05/16/2011   Pfizer(Comirnaty)Fall Seasonal Vaccine 12 years and older 08/28/2023   Pneumococcal Conjugate-13 05/17/2015   Pneumococcal Polysaccharide-23 05/18/2011, 05/05/2021   RSV,unspecified 08/28/2023   Tdap 05/16/2011, 04/03/2022   Zoster Recombinant(Shingrix ) 05/20/2020, 08/26/2020    Screening Tests Health Maintenance  Topic Date Due   COVID-19 Vaccine (6 - 2024-25 season) 07/28/2024   Influenza Vaccine  02/24/2025 (Originally 06/27/2024)   Medicare Annual Wellness (AWV)  08/05/2025   DTaP/Tdap/Td (3 - Td or Tdap) 04/03/2032   Pneumococcal Vaccine: 50+ Years  Completed   Zoster Vaccines- Shingrix   Completed   HPV VACCINES  Aged Out   Meningococcal B Vaccine  Aged Out    Health Maintenance Items Addressed: Information provided on vaccine recommendations   Additional Screening:  Vision Screening: Recommended annual ophthalmology exams for early detection of glaucoma and other disorders of the eye. Is the patient up to date with their annual eye exam?  Yes  Who is the provider or what is the name of the office in which the patient attends annual eye exams? [facility]   Dental Screening: Recommended annual dental exams for proper oral hygiene  Community Resource Referral / Chronic Care Management: CRR required this visit?  No   CCM required this visit?  No   Plan:    I have personally reviewed and noted the following in the patient's chart:   Medical and social history Use of alcohol, tobacco or illicit drugs  Current medications and supplements including opioid prescriptions. Patient is not currently taking opioid prescriptions. Functional ability and status Nutritional status Physical activity Advanced directives List of other physicians Hospitalizations, surgeries, and ER visits in  previous 12 months Vitals Screenings to include cognitive, depression, and falls Referrals and appointments  In addition, I have reviewed and discussed with patient certain preventive protocols, quality metrics, and best practice recommendations. A written personalized care plan for preventive services as well as general preventive health recommendations were provided to patient.   Lavelle Pfeiffer [place], [place]   [id number]   After Visit Summary: (MyChart) Due to this being a telephonic visit, the after visit summary with patients personalized plan was offered to patient via MyChart   Notes: PCP Follow Up Recommendations: Patient is complaining of persistent headache that started a couple weeks ago.  Blood pressure has been normal.  Slightly relieved by taking Tylenol .

## 2024-08-05 NOTE — Patient Instructions (Signed)
 [name],  Thank you for taking the time for your Medicare Wellness Visit. I appreciate your continued commitment to your health goals. Please review the care plan we discussed, and feel free to reach out if I can assist you further.  Medicare recommends these wellness visits once per year to help you and your care team stay ahead of potential health issues. These visits are designed to focus on prevention, allowing your provider to concentrate on managing your acute and chronic conditions during your regular appointments.  Please note that Annual Wellness Visits do not include a physical exam. Some assessments may be limited, especially if the visit was conducted virtually. If needed, we may recommend a separate in-person follow-up with your provider.  Ongoing Care Seeing your primary care provider every 3 to 6 months helps us  monitor your health and provide consistent, personalized care.   Referrals If a referral was made during today's visit and you haven't received any updates within two weeks, please contact the referred provider directly to check on the status.  Recommended Screenings:  Health Maintenance  Topic Date Due   COVID-19 Vaccine (6 - 2024-25 season) 07/28/2024   Flu Shot  02/24/2025*   Medicare Annual Wellness Visit  08/05/2025   DTaP/Tdap/Td vaccine (3 - Td or Tdap) 04/03/2032   Pneumococcal Vaccine for age over 83  Completed   Zoster (Shingles) Vaccine  Completed   HPV Vaccine  Aged Out   Meningitis B Vaccine  Aged Out  *Topic was postponed. The date shown is not the original due date.       08/05/2024    9:28 AM  Advanced Directives  Does Patient Have a Medical Advance Directive? Yes  Type of Estate agent of [place];Living will  Copy of Healthcare Power of Attorney in Chart? No - copy requested   Advance Care Planning is important because it: Ensures you receive medical care that aligns with your values, goals, and preferences. Provides  guidance to your family and loved ones, reducing the emotional burden of decision-making during critical moments.  Vision: Annual vision screenings are recommended for early detection of glaucoma, cataracts, and diabetic retinopathy. These exams can also reveal signs of chronic conditions such as diabetes and high blood pressure.  Dental: Annual dental screenings help detect early signs of oral cancer, gum disease, and other conditions linked to overall health, including heart disease and diabetes.  Please see the attached documents for additional preventive care recommendations.

## 2024-08-06 DIAGNOSIS — M6281 Muscle weakness (generalized): Secondary | ICD-10-CM | POA: Diagnosis not present

## 2024-08-06 DIAGNOSIS — M1612 Unilateral primary osteoarthritis, left hip: Secondary | ICD-10-CM | POA: Diagnosis not present

## 2024-08-06 DIAGNOSIS — R2681 Unsteadiness on feet: Secondary | ICD-10-CM | POA: Diagnosis not present

## 2024-08-08 ENCOUNTER — Ambulatory Visit: Payer: Self-pay

## 2024-08-08 DIAGNOSIS — R2681 Unsteadiness on feet: Secondary | ICD-10-CM | POA: Diagnosis not present

## 2024-08-08 NOTE — Telephone Encounter (Signed)
 Appt scheduled

## 2024-08-08 NOTE — Telephone Encounter (Signed)
  FYI Only or Action Required?: FYI only for provider.  Patient was last seen in primary care on 07/15/2024 by Amon Aloysius BRAVO, MD.  Called Nurse Triage reporting Leg Pain.  Symptoms began several months ago.  Interventions attempted: Other: states that nothing helps.  Symptoms are: unchanged.  Triage Disposition: See PCP When Office is Open (Within 3 Days)  Patient/caregiver understands and will follow disposition?: Yes Copied from CRM [id number]. Topic: Clinical - Red Word Triage >> Aug 08, 2024 12:29 PM [name] wrote: Red Word that prompted transfer to Nurse Triage: Legs feeling hot, burning pain Reason for Disposition  [1] MODERATE pain (e.[name]., interferes with normal activities, limping) AND [2] present > 3 days  Answer Assessment - Initial Assessment Questions 1. ONSET: When did the pain start?      February 2025.  Different from hip pain that is currently being treated by orthopedics 2. LOCATION: Where is the pain located?      Bilateral leg pain and burning 3. PAIN: How bad is the pain?    (Scale 1-10; or mild, moderate, severe)     Pain is worse at night 4. WORK OR EXERCISE: Has there been any recent work or exercise that involved this part of the body?      denies 5. CAUSE: What do you think is causing the leg pain?     unknown 6. OTHER SYMPTOMS: Do you have any other symptoms? (e.[name]., chest pain, back pain, breathing difficulty, swelling, rash, fever, numbness, weakness)     Denies swelling in legs, ankles, and feet 7. PREGNANCY: Is there any chance you are pregnant? When was your last menstrual period?     N/a  At end of conversation, patient mentions headache that is responsive to tylenol   Protocols used: Leg Pain-A-AH

## 2024-08-11 DIAGNOSIS — M1612 Unilateral primary osteoarthritis, left hip: Secondary | ICD-10-CM | POA: Diagnosis not present

## 2024-08-11 DIAGNOSIS — M6281 Muscle weakness (generalized): Secondary | ICD-10-CM | POA: Diagnosis not present

## 2024-08-11 DIAGNOSIS — R2681 Unsteadiness on feet: Secondary | ICD-10-CM | POA: Diagnosis not present

## 2024-08-12 ENCOUNTER — Ambulatory Visit: Admitting: Medical

## 2024-08-12 ENCOUNTER — Other Ambulatory Visit: Payer: Self-pay | Admitting: Medical

## 2024-08-12 VITALS — BP 116/54 | HR 85 | Temp 97.9°F | Resp 18 | Ht 74.0 in | Wt 153.8 lb

## 2024-08-12 DIAGNOSIS — G629 Polyneuropathy, unspecified: Secondary | ICD-10-CM

## 2024-08-12 DIAGNOSIS — J3489 Other specified disorders of nose and nasal sinuses: Secondary | ICD-10-CM

## 2024-08-12 DIAGNOSIS — E875 Hyperkalemia: Secondary | ICD-10-CM

## 2024-08-12 DIAGNOSIS — R519 Headache, unspecified: Secondary | ICD-10-CM

## 2024-08-12 DIAGNOSIS — M545 Low back pain, unspecified: Secondary | ICD-10-CM | POA: Diagnosis not present

## 2024-08-12 DIAGNOSIS — M25552 Pain in left hip: Secondary | ICD-10-CM | POA: Diagnosis not present

## 2024-08-12 MED ORDER — FLUTICASONE PROPIONATE 50 MCG/ACT NA SUSP: 2.0000 | Freq: Every day | NASAL | 1 refills | Status: AC

## 2024-08-12 NOTE — Progress Notes (Unsigned)
   Subjective:    Patient ID: [name], male    DOB: [birth year], 89 y.o.   MRN: [id number]  HPI    Review of Systems     Objective:   Physical Exam        Assessment & Plan:

## 2024-08-12 NOTE — Progress Notes (Signed)
 Subjective:    Patient ID: [name], male    DOB: September 24, 1930, 89 y.o.   MRN: [id number]  HPI EMIR NACK is a 89 year old male with osteoarthritis who presents with a burning sensation in his legs.  He has been experiencing a burning sensation in his legs since February, approximately five days after the onset of hip pain which started back in february. The burning is primarily located in the thighs and shins, sparing the ankles. He describes it as feeling like 'all the blood up here had run down in my legs' and likens it to having weights on his legs. Initially, the sensation was severe enough to disturb his sleep, but it has improved over the past week. No associated back pain is reported.  His medical history includes osteoarthritis, particularly affecting his left hip. He reports that he had x-rays and a CT scan in February. He received cortisone injections in February and a month ago, with the latter providing some relief. He is undergoing physical therapy and has completed five or six sessions. His left hip feels better, though he occasionally takes Tylenol  for pain management.  He recalls a history of multilevel degenerative changes and space narrowing in his lumbar spine from a 2010 MRI, but denies current back pain. He also has a history of a remote fracture in his lumbar spine and disc protrusion at L3-L4.  He experiences intermittent right ear pain that began about a month ago, initially waking him at night. The pain progressed to his inner ear, jaw, and right frontal area, but has since resolved. Blowing his nose provided temporary relief from the frontal pain. He takes Tylenol  for pain management.  He takes a multivitamin daily, as recommended by his endocrinologist, who manages his osteoporosis. He receives an injection called Tolura every six months for osteoporosis management.         Review of Systems  Constitutional:  Negative for chills, fatigue and fever.  HENT:   Positive for ear pain. Negative for congestion.   Respiratory:  Negative for chest tightness, shortness of breath and wheezing.   Cardiovascular:  Negative for chest pain and palpitations.  Gastrointestinal:  Negative for abdominal pain and blood in stool.  Musculoskeletal:  Negative for back pain, myalgias and neck pain.  Neurological:  Negative for dizziness, speech difficulty, weakness and light-headedness.       Lower ext neuropathy  Hematological:  Negative for adenopathy.  Psychiatric/Behavioral:  Negative for behavioral problems and dysphoric mood.       Past Medical History:  Diagnosis Date   Anxiety    Atrial fibrillation (HCC)    Blood transfusion without reported diagnosis    CAD    a. 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 // Myoview  9/22: EF 57, normal perfusion; low risk   Cataract    Chronic abdominal pain    Chronic nausea    Coronary artery disease    Diverticulosis 2011   Diverticulitis 2004   Dyspepsia    Esophageal reflux    GERD (gastroesophageal reflux disease)    Headache    saw neuro 4-16, MRI done   History of echocardiogram    Echo 9/22: EF 60-65, no RWMA, normal diastolic function, GLS -24.9, normal RVSF, normal PASP, mild-moderate BAE, trivial MR, moderate MAC, moderate AV calcification, mild AI, mild aortic stenosis (mean gradient 5 mmHg, V-max 158 cm/s, DI 0.62), mild  dilation of ascending aorta (40 mm)   HYPERPLASIA, PROSTATE NOS W/URINARY OBST/LUTS    IBS    INGUINAL HERNIA    Lichen planus 2006   Niels Hoover MD   LUMBAR RADICULOPATHY, RIGHT    Mixed hyperlipidemia    OSTEOPOROSIS    PONV (postoperative nausea and vomiting)    PREMATURE VENTRICULAR CONTRACTIONS    a. After CABG - was on Coumadin/Multaq  for a period of time. Coumadin discontinued 04/2010 after maintaining NSR.   UNSPECIFIED ANEMIA      Social History   Socioeconomic History   Marital status:  Legally Separated    Spouse name: Not on file   Number of children: 0   Years of education: Not on file   Highest education level: Not on file  Occupational History   Occupation: retired    Associate Professor: RETIRED  Tobacco Use   Smoking status: Former    Current packs/day: 0.00    Types: Cigarettes    Quit date: 11/27/1962    Years since quitting: 61.7   Smokeless tobacco: Never  Vaping Use   Vaping status: Never Used  Substance and Sexual Activity   Alcohol use: Not Currently    Alcohol/week: 0.0 standard drinks of alcohol    Comment: quit drinking    Drug use: No   Sexual activity: Not Currently  Other Topics Concern   Not on file  Social History Narrative   Lives at [facility] in independent living.     Lost wife 06-2016.     Has no children.  Family: nephews-nices in [place]   Retired from [employer].     Still drives sometimes.    Social Drivers of Corporate investment banker Strain: Low Risk  (08/05/2024)   Overall Financial Resource Strain (CARDIA)    Difficulty of Paying Living Expenses: Not hard at all  Food Insecurity: No Food Insecurity (08/05/2024)   Hunger Vital Sign    Worried About Running Out of Food in the Last Year: Never true    Ran Out of Food in the Last Year: Never true  Transportation Needs: No Transportation Needs (08/05/2024)   PRAPARE - Administrator, Civil Service (Medical): No    Lack of Transportation (Non-Medical): No  Physical Activity: Insufficiently Active (08/05/2024)   Exercise Vital Sign    Days of Exercise per Week: 3 days    Minutes of Exercise per Session: 30 min  Stress: No Stress Concern Present (08/05/2024)   [employer] of Occupational Health - Occupational Stress Questionnaire    Feeling of Stress: Not at all  Social Connections: Moderately Integrated (08/05/2024)   Social Connection and Isolation Panel    Frequency of Communication with Friends and Family: Once a week    Frequency of Social Gatherings with Friends  and Family: Twice a week    Attends Religious Services: More than 4 times per year    Active Member of [employer] or Organizations: Yes    Attends Banker Meetings: More than 4 times per year    Marital Status: Widowed  Intimate Partner Violence: Not At Risk (08/05/2024)   Humiliation, Afraid, Rape, and Kick questionnaire    Fear of Current or Ex-Partner: No    Emotionally Abused: No    Physically Abused: No    Sexually Abused: No    Past Surgical History:  Procedure Laterality Date   BIOPSY  09/10/2018   Procedure: BIOPSY;  Surgeon: Albertus Gordy HERO, MD;  Location: [facility];  Service: Gastroenterology;;   CARDIOVERSION N/A 08/31/2023   Procedure: CARDIOVERSION;  Surgeon: Michele Richardson, DO;  Location: [facility];  Service: Cardiovascular;  Laterality: N/A;   CATARACT EXTRACTION Right 10/06/2013   CORONARY ARTERY BYPASS GRAFT     2 VD;anomalous RCA;post op compliacted by AF   ESOPHAGOGASTRODUODENOSCOPY (EGD) WITH PROPOFOL  N/A 09/10/2018   Procedure: ESOPHAGOGASTRODUODENOSCOPY (EGD) WITH PROPOFOL ;  Surgeon: Albertus Gordy HERO, MD;  Location: [facility];  Service: Gastroenterology;  Laterality: N/A;   HIP FRACTURE SURGERY Right 03/2007   Dr. Rollie   INGUINAL HERNIA REPAIR Left 2012   Dr Vanderbilt   KNEE SURGERY Right 1977   repair   PROSTATE BIOPSY  09/19/2013   [facility]    Family History  Problem Relation Age of Onset   Stroke Mother    Pancreatic cancer Father        Deceased, 21   Breast cancer Sister        Deceased   Heart disease Sister 89   Stroke Maternal Aunt    Colon cancer Neg Hx    Prostate cancer Neg Hx    Esophageal cancer Neg Hx    Rectal cancer Neg Hx    Stomach cancer Neg Hx     Allergies  Allergen Reactions   Amoxicillin  Diarrhea, Nausea And Vomiting and Other (See Comments)    Patient questions this   Zoloft  [Sertraline  Hcl]     Patient didn't like the way it made him feel   Remeron  [Mirtazapine ] Other (See Comments)     Caused excessive drowsiness    Current Outpatient Medications on File Prior to Visit  Medication Sig Dispense Refill   acetaminophen  (TYLENOL ) 650 MG CR tablet Take 325-650 mg by mouth every 8 (eight) hours as needed for pain (Arthritis).     amiodarone  (PACERONE ) 200 MG tablet Take 1 tablet (200 mg total) by mouth daily. 90 tablet 3   calcium  carbonate (TUMS - DOSED IN MG ELEMENTAL CALCIUM ) 500 MG chewable tablet Chew 500 mg by mouth daily.     Cholecalciferol  (VITAMIN D -3) 25 MCG (1000 UT) CAPS Take 1,000 Units by mouth daily with breakfast.     denosumab  (PROLIA ) 60 MG/ML SOSY injection Inject 60 mg into the skin every 6 (six) months.     docusate sodium  (COLACE) 100 MG capsule Take 1 capsule (100 mg total) by mouth 2 (two) times daily. 180 capsule 1   ELIQUIS  5 MG TABS tablet TAKE 1 TABLET (5 MG TOTAL) BY MOUTH 2 (TWO) TIMES DAILY. 60 tablet 5   famotidine -calcium  carbonate-magnesium hydroxide (PEPCID  COMPLETE) 10-800-165 MG chewable tablet Chew 1 tablet by mouth daily as needed (indigestion).     folic acid  (FOLVITE ) 1 MG tablet TAKE 1 TABLET (1 MG TOTAL) BY MOUTH DAILY. 90 tablet 0   Glycerin-Hypromellose-PEG 400 (DRY EYE RELIEF DROPS) 0.2-0.2-1 % SOLN Place 1 drop into both eyes every morning. Lubricating eyes     Multiple Vitamin (MULITIVITAMIN WITH MINERALS) TABS Take 1 tablet by mouth daily.     nitroGLYCERIN  (NITROSTAT ) 0.4 MG SL tablet DISSOLVE 1 TABLET UNDER TONGUE EVERY 5 MINUTES FOR UP TO 3 DOSES AS NEEDED FOR CHEST PAIN 25 tablet 9   rosuvastatin  (CRESTOR ) 10 MG tablet Take 1 tablet (10 mg total) by mouth daily. 90 tablet 3   Current Facility-Administered Medications on File Prior to Visit  Medication Dose Route Frequency Provider Last Rate Last Admin   [START ON 11/01/2024] denosumab  (PROLIA ) injection 60 mg  60 mg Subcutaneous Once  Trixie File, MD        BP (!) 116/54   Pulse 85   Temp 97.9 F (36.6 C)   Resp 18   Ht 6' 2 (1.88 m)   Wt 153 lb 12.8 oz (69.8 kg)    SpO2 98%   BMI 19.75 kg/m         Objective:   Physical Exam  General Mental Status- Alert. General Appearance- Not in acute distress.   Skin General: Color- Normal Color. Moisture- Normal Moisture.  Neck Carotid Arteries- Normal color. Moisture- Normal Moisture. No carotid bruits. No JVD.  Chest and Lung Exam Auscultation: Breath Sounds:-CTA  Cardiovascular Auscultation:Rythm- RRR Murmurs & Other Heart Sounds:Auscultation of the heart reveals- No Murmurs.  Abdomen Inspection:-Inspeection Normal. Palpation/Percussion:Note:No mass. Palpation and Percussion of the abdomen reveal- Non Tender, Non Distended + BS, no rebound or guarding.   Neurologic Cranial Nerve exam:- CN III-XII intact(No nystagmus), symmetric smile. Strength:- 5/5 equal and symmetric strength both upper and lower extremities.       Assessment & Plan:   Patient Instructions  Bilateral hip osteoarthritis Chronic bilateral hip osteoarthritis left side more symptomatic. Recent cortisone injection effective. Physical therapy beneficial. Can use Tylenol  for pain. -follow up with orthopedist.  Bilateral lower extremity neuropathy/paresthesias.(Rare lumbar pain last Friday) Chronic burning sensation in legs, primarily thighs and shins. Differential includes neuropathy from vitamin deficiencies or lumbar spine issues. Previous imaging showed lumbar degenerative changes and disc protrusion. - Order B12, B1, and folate levels. - Consider MRI of lumbar spine if symptoms persist or worsen. - Continue acetaminophen  for pain management as needed.  For rt frontal sinus pressure will rx flonase (treat for allergic rhinitis). For hx of temporal pain in past with regional pain will get sed rate. For regional pain in ear pain months ago or so will need to know if those area hurt again. If so check that day. If any teeth pain let us  know as well as you are on prolia . We discussed side effect  Follow up date to be  determined after lab review   I personally spent a total of 43 minutes in the care of the patient today including performing a medically appropriate exam/evaluation, counseling and educating, placing orders, and documenting clinical information in the EHR.  Extra time taken as pt gave historical account of how pain progressed since febrruay for both lower ext neuropathy and sinus pressure/regional pain complaint.

## 2024-08-12 NOTE — Patient Instructions (Addendum)
 Bilateral hip osteoarthritis Chronic bilateral hip osteoarthritis left side more symptomatic. Recent cortisone injection effective. Physical therapy beneficial. Can use Tylenol  for pain. -follow up with orthopedist.  Bilateral lower extremity neuropathy/paresthesias.(Rare lumbar pain last Friday) Chronic burning sensation in legs, primarily thighs and shins. Differential includes neuropathy from vitamin deficiencies or lumbar spine issues. Previous imaging showed lumbar degenerative changes and disc protrusion. - Order B12, B1, and folate levels. - Consider MRI of lumbar spine if symptoms persist or worsen. - Continue acetaminophen  for pain management as needed. -consider medication for neuropathy but not presently as benefit I think dose not exceed risk  For rt frontal sinus pressure will rx flonase (treat for allergic rhinitis). For hx of temporal pain in past with regional pain will get sed rate. For regional pain in ear pain months ago or so will need to know if those area hurt again. If so check that day. If any teeth pain let us  know as well as you are on prolia . We discussed side effect  Hx of high potassium. Recheck cmp today  Follow up date to be determined after lab review

## 2024-08-13 DIAGNOSIS — M6281 Muscle weakness (generalized): Secondary | ICD-10-CM | POA: Diagnosis not present

## 2024-08-13 DIAGNOSIS — R2681 Unsteadiness on feet: Secondary | ICD-10-CM | POA: Diagnosis not present

## 2024-08-13 DIAGNOSIS — M1612 Unilateral primary osteoarthritis, left hip: Secondary | ICD-10-CM | POA: Diagnosis not present

## 2024-08-13 LAB — FOLATE: Folate: >22.9 ng/mL (ref >5.9)

## 2024-08-13 LAB — SEDIMENTATION RATE: Sed Rate: 4 mm/h (ref 0–20)

## 2024-08-13 LAB — VITAMIN B12: Vitamin B-12: 594 pg/mL (ref 211–911)

## 2024-08-14 ENCOUNTER — Ambulatory Visit: Payer: Self-pay | Admitting: Medical

## 2024-08-15 DIAGNOSIS — R2681 Unsteadiness on feet: Secondary | ICD-10-CM | POA: Diagnosis not present

## 2024-08-15 DIAGNOSIS — M6281 Muscle weakness (generalized): Secondary | ICD-10-CM | POA: Diagnosis not present

## 2024-08-15 DIAGNOSIS — M1612 Unilateral primary osteoarthritis, left hip: Secondary | ICD-10-CM | POA: Diagnosis not present

## 2024-08-16 LAB — COMPLETE METABOLIC PANEL WITHOUT GFR
AG Ratio: 2 (calc) (ref 1.0–2.5)
ALT: 19 U/L (ref 9–46)
AST: 24 U/L (ref 10–35)
Albumin: 4.4 g/dL (ref 3.6–5.1)
Alkaline phosphatase (APISO): 48 U/L (ref 35–144)
BUN/Creatinine Ratio: 28 (calc) — ABNORMAL HIGH (ref 6–22)
BUN: 19 mg/dL (ref 7–25)
CO2: 29 mmol/L (ref 20–32)
Calcium: 9.1 mg/dL (ref 8.6–10.3)
Chloride: 98 mmol/L (ref 98–110)
Creat: 0.69 mg/dL — ABNORMAL LOW (ref 0.70–1.22)
Globulin: 2.2 g/dL (ref 1.9–3.7)
Glucose, Bld: 97 mg/dL (ref 65–99)
Potassium: 4.9 mmol/L (ref 3.5–5.3)
Sodium: 135 mmol/L (ref 135–146)
Total Bilirubin: 0.5 mg/dL (ref 0.2–1.2)
Total Protein: 6.6 g/dL (ref 6.1–8.1)

## 2024-08-16 LAB — VITAMIN B1: Vitamin B1 (Thiamine): 21 nmol/L (ref 8–30)

## 2024-08-18 DIAGNOSIS — M1612 Unilateral primary osteoarthritis, left hip: Secondary | ICD-10-CM | POA: Diagnosis not present

## 2024-08-18 DIAGNOSIS — R2681 Unsteadiness on feet: Secondary | ICD-10-CM | POA: Diagnosis not present

## 2024-08-18 DIAGNOSIS — M6281 Muscle weakness (generalized): Secondary | ICD-10-CM | POA: Diagnosis not present

## 2024-08-20 DIAGNOSIS — R2681 Unsteadiness on feet: Secondary | ICD-10-CM | POA: Diagnosis not present

## 2024-08-22 DIAGNOSIS — M6281 Muscle weakness (generalized): Secondary | ICD-10-CM | POA: Diagnosis not present

## 2024-08-22 DIAGNOSIS — R2681 Unsteadiness on feet: Secondary | ICD-10-CM | POA: Diagnosis not present

## 2024-08-22 DIAGNOSIS — M1612 Unilateral primary osteoarthritis, left hip: Secondary | ICD-10-CM | POA: Diagnosis not present

## 2024-08-25 DIAGNOSIS — R2681 Unsteadiness on feet: Secondary | ICD-10-CM | POA: Diagnosis not present

## 2024-08-25 DIAGNOSIS — M6281 Muscle weakness (generalized): Secondary | ICD-10-CM | POA: Diagnosis not present

## 2024-08-25 DIAGNOSIS — M1612 Unilateral primary osteoarthritis, left hip: Secondary | ICD-10-CM | POA: Diagnosis not present

## 2024-08-27 DIAGNOSIS — R2681 Unsteadiness on feet: Secondary | ICD-10-CM | POA: Diagnosis not present

## 2024-08-27 DIAGNOSIS — M1612 Unilateral primary osteoarthritis, left hip: Secondary | ICD-10-CM | POA: Diagnosis not present

## 2024-08-27 DIAGNOSIS — M6281 Muscle weakness (generalized): Secondary | ICD-10-CM | POA: Diagnosis not present

## 2024-09-01 DIAGNOSIS — R2681 Unsteadiness on feet: Secondary | ICD-10-CM | POA: Diagnosis not present

## 2024-09-01 DIAGNOSIS — M1612 Unilateral primary osteoarthritis, left hip: Secondary | ICD-10-CM | POA: Diagnosis not present

## 2024-09-01 DIAGNOSIS — M6281 Muscle weakness (generalized): Secondary | ICD-10-CM | POA: Diagnosis not present

## 2024-09-02 ENCOUNTER — Telehealth: Payer: Self-pay

## 2024-09-02 NOTE — Telephone Encounter (Signed)
 Spoke w/ RN- no appts available here today. Pt lives at [facility], he is going to see the physician on site there, he is also scheduled w/ Dr. [name] tomorrow.

## 2024-09-02 NOTE — Telephone Encounter (Signed)
 Initial Comment Caller states he is experiencing nose bleeds, headaches, and irregular heart beat. Additional Comment patient has a-fib GOTO Facility Not Listed Facility clinic within residence Translation No Nurse Assessment Nurse: Tamea, RN, Caprice Date/Time (Eastern Time): 09/02/2024 1:15:34 PM Confirm and document reason for call. If symptomatic, describe symptoms. ---Caller is at a continuing care facility, [facility]. About 600 residents. He had a nose bleed yesterday, he never has nose bleeds. Yesterday was dripping, and then stopped and went away, bleeding to nose has not continued. This morning when laying down after exercise he can feel liquid running down throatgot up to spit that out, and afterwards he had relief of headache. Yesterday he was prescribed Nasonexbut he has never had a nose bleed until taking this medication. He went to the facility clinic to have his BP checked and it was 118/54, it was checked again 116/51. He had cardioversion about one year ago. He is pending echocardiogram in September and will see cardiologist next Tuesday. Does the patient have any new or worsening symptoms? ---Yes Will a triage be completed? ---Yes Related visit to physician within the last 2 weeks? ---No Does the PT have any chronic conditions? (i.e. diabetes, asthma, this includes High risk factors for pregnancy, etc.) ---Yes List chronic conditions. ---a-fib Is this a behavioral health or substance abuse call? ---No PLEASE NOTE: All timestamps contained within this report are represented as [place] Standard Time. CONFIDENTIALTY NOTICE: This fax transmission is intended only for the addressee. It contains information that is legally privileged, confidential or otherwise protected from use or disclosure. If you are not the intended recipient, you are strictly prohibited from reviewing, disclosing, copying using or disseminating any of this information or taking any action in  reliance on or regarding this information. If you have received this fax in error, please notify us  immediately by telephone so that we can arrange for its return to us . Phone: [phone], Toll-Free: [phone], Fax: [phone] [email or web address] January 07, 1930 Page: 1 of2 CallId: [id number] Guidelines Guideline Title Affirmed Question Affirmed Notes Nurse Date/Time Titus Time) Heart Rate and Heartbeat Questions Age > 60 years (Exception: Brief heartbeat symptoms that went away and now feels well.) Tamea OBIE Caprice 09/02/2024 1:24:48 PM Disp. Time Titus Time) Disposition Final User 09/02/2024 1:35:09 PM See HCP (or PCP Triage) Within 4 Hours Yes Tamea RN, Caprice Final Disposition 09/02/2024 1:35:09 PM See HCP (or PCP Triage) Within 4 Hours Yes Tamea, RN, Caprice [place] Disagree/Comply Comply Caller Understands Yes PreDisposition Call Doctor Care Advice Given Per Guideline * IF OFFICE WILL BE OPEN: You need to be seen within the next 3 or 4 hours. Call your doctor (or NP/PA) now or as soon as the office opens. SEE HCP (OR PCP TRIAGE) WITHIN 4 HOURS: SOURCES OF CARE: * OFFICE: If patient sounds stable and not seriously ill, consult PCP (or follow your office policy) to see if patient can be seen NOW in office. CARE ADVICE given per Heart Rate and Heartbeat Questions (Adult) guideline. * You become worse CALL BACK IF: Comments User: Caprice Tamea, RN Date/Time Titus Time): 09/02/2024 1:26:34 PM Mid day he will have increased fatigue and will recover in the later afternoon. And in the evening he will get chills. User: Caprice Tamea, RN Date/Time Titus Time): 09/02/2024 1:31:49 PM Caller is on amiodarone  for a-fib, eliquis , statin, vitamins / supplement. and is up to date on all meds, he takes them faithfully. User: Caprice Tamea, RN Date/Time Titus Time): 09/02/2024 1:40:03 PM Office does not have availability. Caller will  refer to clinic in the  assisted living facility. Will refer to scheduled appointment tomorrow with office. Referrals GO TO FACILITY OTHER - SPECIFY

## 2024-09-03 ENCOUNTER — Ambulatory Visit: Admitting: Family Medicine

## 2024-09-03 ENCOUNTER — Encounter: Payer: Self-pay | Admitting: Family Medicine

## 2024-09-03 VITALS — BP 110/64 | HR 64 | Temp 97.8°F | Resp 16 | Ht 74.0 in | Wt 150.6 lb

## 2024-09-03 DIAGNOSIS — K219 Gastro-esophageal reflux disease without esophagitis: Secondary | ICD-10-CM

## 2024-09-03 DIAGNOSIS — I4819 Other persistent atrial fibrillation: Secondary | ICD-10-CM | POA: Diagnosis not present

## 2024-09-03 DIAGNOSIS — J302 Other seasonal allergic rhinitis: Secondary | ICD-10-CM

## 2024-09-03 MED ORDER — ALUM & MAG HYDROXIDE-SIMETH 200-200-20 MG/5ML PO SUSP
15.0000 mL | Freq: Once | ORAL | Status: AC
  Administered 2024-09-03: 15 mL via ORAL

## 2024-09-03 MED ORDER — LEVOCETIRIZINE DIHYDROCHLORIDE 5 MG PO TABS: 5.0000 mg | ORAL_TABLET | Freq: Every evening | ORAL | 2 refills | Status: DC

## 2024-09-03 MED ORDER — HYOSCYAMINE SULFATE 0.125 MG SL SUBL
0.1250 mg | SUBLINGUAL_TABLET | Freq: Once | SUBLINGUAL | Status: AC
  Administered 2024-09-03: 0.125 mg via SUBLINGUAL

## 2024-09-03 MED ORDER — LIDOCAINE VISCOUS HCL 2 % MT SOLN
15.0000 mL | Freq: Once | OROMUCOSAL | Status: AC
  Administered 2024-09-03: 15 mL via OROMUCOSAL

## 2024-09-03 NOTE — Progress Notes (Signed)
 Chief Complaint  Patient presents with   Gastroesophageal Reflux    Reflux    Subjective: Patient is a 89 y.o. male here for a fib.  Taking Amiodarone  200 mg/d Compliant, no AE's. 3 d ago started having low energy. Eating/drinking normally for the most part. No N/V/D, bleeding, recent illness. He does have reflux. Took some Tums which did help. Pepcid  was not helpful where it usually is. He felt his pulse and it was irregular. Had family visit 4-5 d  ago and he told them he wanted them to get certain things when he died. This made him nervous and he stops eating when he is nervous.   Past Medical History:  Diagnosis Date   Anxiety    Atrial fibrillation (HCC)    Blood transfusion without reported diagnosis    CAD    a. 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 // Myoview  9/22: EF 57, normal perfusion; low risk   Cataract    Chronic abdominal pain    Chronic nausea    Coronary artery disease    Diverticulosis 2011   Diverticulitis 2004   Dyspepsia    Esophageal reflux    GERD (gastroesophageal reflux disease)    Headache    saw neuro 4-16, MRI done   History of echocardiogram    Echo 9/22: EF 60-65, no RWMA, normal diastolic function, GLS -24.9, normal RVSF, normal PASP, mild-moderate BAE, trivial MR, moderate MAC, moderate AV calcification, mild AI, mild aortic stenosis (mean gradient 5 mmHg, V-max 158 cm/s, DI 0.62), mild dilation of ascending aorta (40 mm)   HYPERPLASIA, PROSTATE NOS W/URINARY OBST/LUTS    IBS    INGUINAL HERNIA    Lichen planus 2006   Niels Hoover MD   LUMBAR RADICULOPATHY, RIGHT    Mixed hyperlipidemia    OSTEOPOROSIS    PONV (postoperative nausea and vomiting)    PREMATURE VENTRICULAR CONTRACTIONS    a. After CABG - was on Coumadin/Multaq  for a period of time. Coumadin discontinued 04/2010 after maintaining NSR.   UNSPECIFIED ANEMIA     Objective: BP 110/64 (BP Location: Left  Arm, Patient Position: Sitting)   Pulse 64   Temp 97.8 F (36.6 C) (Oral)   Resp 16   Ht 6' 2 (1.88 m)   Wt 150 lb 9.6 oz (68.3 kg)   SpO2 95%   BMI 19.34 kg/m  General: Awake, appears stated age Heart: Reg rhythm, bradycardic, SEM heard loudest at aortic listening post, no LE edema Lungs: CTAB, no rales, wheezes or rhonchi. No accessory muscle use ABD: BS+, S, NT, ND Psych: Age appropriate judgment and insight, normal affect and mood  Assessment and Plan: Gastroesophageal reflux disease, unspecified whether esophagitis present  Persistent atrial fibrillation (HCC)  Seasonal allergic rhinitis, unspecified trigger - Plan: levocetirizine (XYZAL) 5 MG tablet  Exacerbation of chronic issue. GI cocktail today- noted improvement taking. Cont Pepcid  prn. If no sig improvement with this, would consider prn PPI. Could be related to stress.  No concerns today.  He will follow-up with his cardiologist next week Cont INCS, Xyzal.  The patient voiced understanding and agreement to the plan.  Mabel [place], DO 09/03/24  11:52 AM

## 2024-09-03 NOTE — Patient Instructions (Signed)
 The only lifestyle changes that have data behind them are weight loss for the overweight/obese and elevating the head of the bed. Finding out which foods/positions are triggers is important.  Pepcid /famotidine  20 mg 1-2 times daily can help with reflux symptoms. Send me a message early next week if we are not improving.   Let us  know if you need anything.

## 2024-09-03 NOTE — Addendum Note (Signed)
 Addended by: Deegan Valentino M on: 09/03/2024 12:03 PM   Modules accepted: Orders

## 2024-09-04 DIAGNOSIS — R2681 Unsteadiness on feet: Secondary | ICD-10-CM | POA: Diagnosis not present

## 2024-09-04 DIAGNOSIS — M1612 Unilateral primary osteoarthritis, left hip: Secondary | ICD-10-CM | POA: Diagnosis not present

## 2024-09-04 DIAGNOSIS — M6281 Muscle weakness (generalized): Secondary | ICD-10-CM | POA: Diagnosis not present

## 2024-09-05 ENCOUNTER — Other Ambulatory Visit: Payer: Self-pay | Admitting: Cardiovascular Disease

## 2024-09-05 ENCOUNTER — Ambulatory Visit: Admitting: Podiatry

## 2024-09-05 ENCOUNTER — Encounter: Payer: Self-pay | Admitting: Podiatry

## 2024-09-05 DIAGNOSIS — Z7901 Long term (current) use of anticoagulants: Secondary | ICD-10-CM

## 2024-09-05 DIAGNOSIS — B351 Tinea unguium: Secondary | ICD-10-CM

## 2024-09-05 DIAGNOSIS — M79675 Pain in left toe(s): Secondary | ICD-10-CM

## 2024-09-05 DIAGNOSIS — L84 Corns and callosities: Secondary | ICD-10-CM

## 2024-09-05 DIAGNOSIS — I480 Paroxysmal atrial fibrillation: Secondary | ICD-10-CM

## 2024-09-05 DIAGNOSIS — M79674 Pain in right toe(s): Secondary | ICD-10-CM | POA: Diagnosis not present

## 2024-09-05 NOTE — Progress Notes (Signed)
  Subjective:  Patient ID: [name], male    DOB: May 14, 1930,   MRN: [id number]  No chief complaint on file.   89 y.o. male presents for concern of callus on both feet and  of thickened elongated and painful nails that are difficult to trim. Requesting to have them trimmed today. He is on eliquis and at risk for foot care  PCP:  Wanda Plump, MD    . Denies any other pedal complaints. Denies n/v/f/c.   Past Medical History:  Diagnosis Date   Anxiety    Atrial fibrillation (HCC)    Blood transfusion without reported diagnosis    CAD    a. 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 // Myoview 9/22: EF 57, normal perfusion; low risk   Cataract    Chronic abdominal pain    Chronic nausea    Coronary artery disease    Diverticulosis 2011   Diverticulitis 2004   Dyspepsia    Esophageal reflux    GERD (gastroesophageal reflux disease)    Headache    saw neuro 4-16, MRI done   History of echocardiogram    Echo 9/22: EF 60-65, no RWMA, normal diastolic function, GLS -24.9, normal RVSF, normal PASP, mild-moderate BAE, trivial MR, moderate MAC, moderate AV calcification, mild AI, mild aortic stenosis (mean gradient 5 mmHg, V-max 158 cm/s, DI 0.62), mild dilation of ascending aorta (40 mm)   HYPERPLASIA, PROSTATE NOS W/URINARY OBST/LUTS    IBS    INGUINAL HERNIA    Lichen planus 2006   Francesca Oman MD   LUMBAR RADICULOPATHY, RIGHT    Mixed hyperlipidemia    OSTEOPOROSIS    PONV (postoperative nausea and vomiting)    PREMATURE VENTRICULAR CONTRACTIONS    a. After CABG - was on Coumadin/Multaq for a period of time. Coumadin discontinued 04/2010 after maintaining NSR.   UNSPECIFIED ANEMIA     Objective:  Physical Exam: Vascular: DP/PT pulses 2/4 bilateral. CFT <3 seconds. Absent hair growth on digits. Edema noted to bilateral lower extremities. Xerosis noted bilaterally.  Skin. No lacerations or abrasions  bilateral feet. Nails 1-5 bilateral  are thickened discolored and elongated with subungual debris. Hyperkeratotic lesion noted sub first metatarsal head bilateral and left fifth metatarsal head Musculoskeletal: MMT 5/5 bilateral lower extremities in DF, PF, Inversion and Eversion. Deceased ROM in DF of ankle joint.  Neurological: Sensation intact to light touch. Protective sensation intact bilateral.    Assessment:   1. Pain due to onychomycosis of toenails of both feet   2. Chronic anticoagulation   3. Pre-ulcerative calluses      Plan:  Patient was evaluated and treated and all questions answered. -Discussed and educated patient on foot care, especially with  regards to the vascular, neurological and musculoskeletal systems.  -Hyperkeratotic tissue noted sub first metatarsal bilateral and left fifth metatarsal head debrided without incident with chisel.  -Mechanically debrided all nails 1-5 bilateral using sterile nail nipper and filed with dremel without incident  -Answered all patient questions -Patient to return  in 3 months for at risk foot care -Patient advised to call the office if any problems or questions arise in the meantime.   Louann Sjogren, DPM

## 2024-09-07 DIAGNOSIS — I350 Nonrheumatic aortic (valve) stenosis: Secondary | ICD-10-CM | POA: Insufficient documentation

## 2024-09-07 NOTE — Assessment & Plan Note (Signed)
 An echocardiogram in August 2024 showed a 40mm dilation. ***

## 2024-09-07 NOTE — Progress Notes (Signed)
 OFFICE NOTE:    Date:  09/09/2024  ID:  [name], DOB May 11, 1930, MRN [id number] PCP: Amon Aloysius BRAVO, MD  [facility] Providers Cardiologist:  Maude Emmer, MD        Coronary artery disease  S/p CABG in 2011 Myoview  07/29/21: EF 57, normal perfusion, low risk Paroxysmal atrial fibrillation Dronedarone  DC'd secondary to GI side effects; Not on beta-blocker due to bradycardia Anticoagulation: Apixaban  Monitor 07/2021: NSR, ATach longest 13 beats Monitor 06/2023: 100% AFib>>Amiodarone  S/p DCCV 08/2023  Aortic stenosis TTE 08/30/15: EF 55-60, no RWMA, GR 1 DD, trivial AI, mild RAE  TTE 07/27/23: EF 60-65, no RWMA, mild LVH, NL RVSF, mod BAE, mild MR, mild AI, asc Aorta 40 mm, RAP 3, AV calcified but no AS by doppler TTE 07/30/24: EF 60-65, no RWMA, mild LVH, NL RVSF, NL PASP, RVSP 25.5, mild MR, trivial AI, mild to mod AS (mean 15, Vmax 237 cm/s, DI 0.33 Hyperlipidemia Ascending aorta dilation  CT 05/18/22: ascending aorta ectasia, 3.8 cm  TTE 06/2023: 40 mm  Osteoporosis Elevated PSA GERD         Discussed the use of AI scribe software for clinical note transcription with the patient, who gave verbal consent to proceed. History of Present Illness [name] is a 89 y.o. male who returns for evaluation of fatigue and shortness of breath. He was last seen by Dr. Emmer in 02/2024.   He has been experiencing significant fatigue and shortness of breath over the past two to three weeks. His energy levels have decreased, and he feels tired even after minimal exertion. Despite undergoing physical therapy for hip arthritis, which initially caused pain but has since improved, he continues to feel fatigued and short of breath, particularly when climbing stairs. No chest pain, pressure, or tightness during exertion. No episodes of syncope, significant coughing, or wheezing. He denies orthopnea, paroxysmal nocturnal dyspnea, and significant coughing or wheezing, except when clearing his  throat. He is not on beta blocker therapy due to bradycardia and has been on amiodarone  since August 2024. He mentions a recent change in his amiodarone  prescription, where he has been taking two 200 mg tablets daily instead of one. He reports experiencing indigestion, particularly after breakfast, which has been occurring for the past two to three days. He manages this with Pepcid , which provides relief.   Review of Systems  Gastrointestinal:  Negative for hematochezia and melena.  Genitourinary:  Negative for hematuria.  -See HPI    Studies Reviewed:  EKG Interpretation Date/Time:  Tuesday September 09 2024 11:00:53 EDT Ventricular Rate:  55 PR Interval:  240 QRS Duration:  126 QT Interval:  472 QTC Calculation: 451 R Axis:   -6  Text Interpretation: Sinus bradycardia with 1st degree A-V block Right bundle branch block No significant change since last tracing Confirmed by Lelon Hamilton [phone]) on 09/09/2024 11:05:09 AM    03/04/24: Hgb 13.2, Plt 186K, TSH 3.29 07/15/24: TC 118, HDL 68.4, LDL 29, Trig 102 08/12/24: K 4.9, SCr 0.69  Risk Assessment/Calculations: CHA2DS2-VASc Score = 3   This indicates a 3.2% annual risk of stroke. The patient's score is based upon: CHF History: 0 HTN History: 0 Diabetes History: 0 Stroke History: 0 Vascular Disease History: 1 Age Score: 2 Gender Score: 0      HYPERTENSION CONTROL Vitals:   09/09/24 1018 09/09/24 1134  BP: (!) 155/57 (!) 150/70    The patient's blood pressure is elevated above target today.  In order  to address the patient's elevated BP: Blood pressure will be monitored at home to determine if medication changes need to be made.         Physical Exam:  VS:  BP (!) 150/70   Pulse (!) 55   Ht 6' 2 (1.88 m)   Wt 150 lb (68 kg)   SpO2 95%   BMI 19.26 kg/m        Wt Readings from Last 3 Encounters:  09/09/24 150 lb (68 kg)  09/03/24 150 lb 9.6 oz (68.3 kg)  08/12/24 153 lb 12.8 oz (69.8 kg)    Constitutional:       Appearance: Healthy appearance. Not in distress.  Neck:     Vascular: No JVR. JVD normal.  Pulmonary:     Breath sounds: Normal breath sounds. No wheezing. No rales.  Cardiovascular:     Normal rate. Regular rhythm.     Murmurs: There is a grade 2/6 systolic murmur at the URSB.  Edema:    Peripheral edema absent.  Abdominal:     Palpations: Abdomen is soft.       Assessment and Plan:    Assessment & Plan Coronary artery disease involving coronary bypass graft of native heart without angina pectoris Shortness of breath He is status post CABG in 2011. Low risk stress test in 2022. He notes several weeks of fatigue, exercise intolerance and dyspnea on exertion. He has not really had chest pain other than indigestion which is clearly associated with meals. He has been struggling with pain from hip arthritis and is improving in that regard with steroid injection and PT. So, I question if all his symptoms are related to deconditioning. It has been several years since his last stress test and his CABG was 14 years ago. Therefore, I think we should rule out an anginal equivalent. I do not think his is describing pulmonary toxicity with Amiodarone  and his O2 sats are normal. But, this should be in the differential as well as anemia, CHF, thyroid  disease.  Of note, his echocardiogram done several weeks ago demonstrated normal EF, mild to moderate aortic stenosis and mild mitral regurgitation.  PASP is normal. - Continue rosuvastatin  10 mg daily, nitroglycerin  as needed - Arrange Lexiscan  Myoview  - BMET, CBC, TSH, NT-proBNP - Follow-up 3 months Persistent atrial fibrillation (HCC) On amiodarone  therapy Maintaining sinus rhythm.  His prescription was renewed recently and the instructions were to take amiodarone  twice daily.  He has had a history of bradycardia.  Question of the twice daily dosing of amiodarone  is somehow contributing to some of his symptoms.  I have asked him to reduce his amiodarone   back to 200 mg daily.  We could certainly consider decreasing this further to 100 mg daily at some point. Will get CXR today to make sure he is not showing any signs of pulmonary toxicity. - BMET, CBC, TSH, NT-Pro BNP today - CXR - Decrease amiodarone  back to 200 mg daily - Continue Eliquis  5 mg twice daily (weight >60 kg, creatinine >1.5) Mixed hyperlipidemia LDL optimal in August 2025. - Continue rosuvastatin  10 mg daily Ascending aorta dilation An echocardiogram in August 2024 showed a 40mm dilation.  Aorta size was normal on most recent echocardiogram. Nonrheumatic aortic valve stenosis Mild to mod AS on last TTE in September 2025.  - Repeat echocardiogram pending 1 year Elevated blood pressure reading BP elevated today. It is usually normal. He will monitor and let me know if it continues to run high.  Informed Consent   Shared Decision Making/Informed Consent The risks [chest pain, shortness of breath, cardiac arrhythmias, dizziness, blood pressure fluctuations, myocardial infarction, stroke/transient ischemic attack, nausea, vomiting, allergic reaction, radiation exposure, metallic taste sensation and life-threatening complications (estimated to be 1 in 10,000)], benefits (risk stratification, diagnosing coronary artery disease, treatment guidance) and alternatives of a nuclear stress test were discussed in detail with Mr. Monjaraz and he agrees to proceed.     Dispo:  Return in about 3 months (around 12/10/2024) for Routine Follow Up, w/ Glendia Ferrier, PA-C.  Signed, Glendia Ferrier, PA-C

## 2024-09-07 NOTE — Assessment & Plan Note (Signed)
 Mild to mod AS on last TTE in September 2025.  - Repeat echocardiogram pending 1 year

## 2024-09-07 NOTE — Assessment & Plan Note (Signed)
 He is status post CABG in 2011. Low risk stress test in 2022. He notes several weeks of fatigue, exercise intolerance and dyspnea on exertion. He has not really had chest pain other than indigestion which is clearly associated with meals. He has been struggling with pain from hip arthritis and is improving in that regard with steroid injection and PT. So, I question if all his symptoms are related to deconditioning. It has been several years since his last stress test and his CABG was 14 years ago. Therefore, I think we should rule out an anginal equivalent. I do not think his is describing pulmonary toxicity with Amiodarone  and his O2 sats are normal. But, this should be in the differential as well as anemia, CHF, thyroid  disease.  Of note, his echocardiogram done several weeks ago demonstrated normal EF, mild to moderate aortic stenosis and mild mitral regurgitation.  PASP is normal. - Continue rosuvastatin  10 mg daily, nitroglycerin  as needed - Arrange Lexiscan  Myoview  - BMET, CBC, TSH, NT-proBNP - Follow-up 3 months

## 2024-09-08 DIAGNOSIS — M6281 Muscle weakness (generalized): Secondary | ICD-10-CM | POA: Diagnosis not present

## 2024-09-08 DIAGNOSIS — R2681 Unsteadiness on feet: Secondary | ICD-10-CM | POA: Diagnosis not present

## 2024-09-08 DIAGNOSIS — M1612 Unilateral primary osteoarthritis, left hip: Secondary | ICD-10-CM | POA: Diagnosis not present

## 2024-09-09 ENCOUNTER — Encounter: Payer: Self-pay | Admitting: Physician Assistant

## 2024-09-09 ENCOUNTER — Ambulatory Visit (HOSPITAL_COMMUNITY)
Admission: RE | Admit: 2024-09-09 | Discharge: 2024-09-09 | Disposition: A | Discharge status: Home / Self Care | Source: Ambulatory Visit | Attending: Internal Medicine | Admitting: Internal Medicine

## 2024-09-09 ENCOUNTER — Ambulatory Visit: Attending: Physician Assistant | Admitting: Physician Assistant

## 2024-09-09 VITALS — BP 150/70 | HR 55 | Ht 74.0 in | Wt 150.0 lb

## 2024-09-09 DIAGNOSIS — J984 Other disorders of lung: Secondary | ICD-10-CM | POA: Diagnosis not present

## 2024-09-09 DIAGNOSIS — E782 Mixed hyperlipidemia: Secondary | ICD-10-CM

## 2024-09-09 DIAGNOSIS — I350 Nonrheumatic aortic (valve) stenosis: Secondary | ICD-10-CM | POA: Insufficient documentation

## 2024-09-09 DIAGNOSIS — Z79899 Other long term (current) drug therapy: Secondary | ICD-10-CM

## 2024-09-09 DIAGNOSIS — R030 Elevated blood-pressure reading, without diagnosis of hypertension: Secondary | ICD-10-CM

## 2024-09-09 DIAGNOSIS — I4819 Other persistent atrial fibrillation: Secondary | ICD-10-CM | POA: Diagnosis not present

## 2024-09-09 DIAGNOSIS — I77810 Thoracic aortic ectasia: Secondary | ICD-10-CM | POA: Diagnosis not present

## 2024-09-09 DIAGNOSIS — I25810 Atherosclerosis of coronary artery bypass graft(s) without angina pectoris: Secondary | ICD-10-CM | POA: Diagnosis not present

## 2024-09-09 DIAGNOSIS — J849 Interstitial pulmonary disease, unspecified: Secondary | ICD-10-CM | POA: Diagnosis not present

## 2024-09-09 DIAGNOSIS — R0602 Shortness of breath: Secondary | ICD-10-CM | POA: Diagnosis not present

## 2024-09-09 DIAGNOSIS — J949 Pleural condition, unspecified: Secondary | ICD-10-CM | POA: Diagnosis not present

## 2024-09-09 DIAGNOSIS — R0600 Dyspnea, unspecified: Secondary | ICD-10-CM | POA: Diagnosis not present

## 2024-09-09 MED ORDER — AMIODARONE HCL 200 MG PO TABS: 200.0000 mg | ORAL_TABLET | Freq: Every day | ORAL | 3 refills | Status: DC

## 2024-09-09 NOTE — Assessment & Plan Note (Signed)
 LDL optimal in August 2025. - Continue rosuvastatin  10 mg daily

## 2024-09-09 NOTE — Assessment & Plan Note (Signed)
 Maintaining sinus rhythm.  His prescription was renewed recently and the instructions were to take amiodarone  twice daily.  He has had a history of bradycardia.  Question of the twice daily dosing of amiodarone  is somehow contributing to some of his symptoms.  I have asked him to reduce his amiodarone  back to 200 mg daily.  We could certainly consider decreasing this further to 100 mg daily at some point. Will get CXR today to make sure he is not showing any signs of pulmonary toxicity. - BMET, CBC, TSH, NT-Pro BNP today - CXR - Decrease amiodarone  back to 200 mg daily - Continue Eliquis  5 mg twice daily (weight >60 kg, creatinine >1.5)

## 2024-09-09 NOTE — Patient Instructions (Addendum)
 Medication Instructions:  REDUCE Amiodarone  to 200 mg once daily   *If you need a refill on your cardiac medications before your next appointment, please call your pharmacy*  Lab Work: TODAY- BMET, CBC, NT Pro BNP, TSH  If you have labs (blood work) drawn today and your tests are completely normal, you will receive your results only by: MyChart Message (if you have MyChart) OR A paper copy in the mail If you have any lab test that is abnormal or we need to change your treatment, we will call you to review the results.  Testing/Procedures: A chest x-ray takes a picture of the organs and structures inside the chest, including the heart, lungs, and blood vessels. This test can show several things, including, whether the heart is enlarges; whether fluid is building up in the lungs; and whether pacemaker / defibrillator leads are still in place.  Your physician has requested that you have a lexiscan  myoview . For further information please visit [email or web address] Please follow instruction sheet, as given.  Follow-Up: At [facility], you and your health needs are our priority.  As part of our continuing mission to provide you with exceptional heart care, our providers are all part of one team.  This team includes your primary Cardiologist (physician) and Advanced Practice Providers or APPs (Physician Assistants and Nurse Practitioners) who all work together to provide you with the care you need, when you need it.  Your next appointment:   3 month(s)  Provider:   Glendia Ferrier, PA-C          We recommend signing up for the patient portal called MyChart.  Sign up information is provided on this After Visit Summary.  MyChart is used to connect with patients for Virtual Visits (Telemedicine).  Patients are able to view lab/test results, encounter notes, upcoming appointments, etc.  Non-urgent messages can be sent to your provider as well.   To learn more about what you can do with  MyChart, go to [email or web address].   Other Instructions

## 2024-09-10 ENCOUNTER — Ambulatory Visit: Payer: Self-pay | Admitting: Physician Assistant

## 2024-09-10 DIAGNOSIS — R2681 Unsteadiness on feet: Secondary | ICD-10-CM | POA: Diagnosis not present

## 2024-09-10 DIAGNOSIS — R9389 Abnormal findings on diagnostic imaging of other specified body structures: Secondary | ICD-10-CM

## 2024-09-10 DIAGNOSIS — M1612 Unilateral primary osteoarthritis, left hip: Secondary | ICD-10-CM | POA: Diagnosis not present

## 2024-09-10 DIAGNOSIS — E875 Hyperkalemia: Secondary | ICD-10-CM

## 2024-09-10 DIAGNOSIS — M6281 Muscle weakness (generalized): Secondary | ICD-10-CM | POA: Diagnosis not present

## 2024-09-10 NOTE — Telephone Encounter (Signed)
 LVM asking pt to call our office to discuss lab results. 1st attempt

## 2024-09-10 NOTE — Telephone Encounter (Signed)
 Spoke with pt regarding lab results. Pt verbalized understanding. Pt stated he went to [facility] today around 12pm to get repeat labs. Pt had no further questions or concerns.

## 2024-09-11 LAB — CBC

## 2024-09-11 LAB — POTASSIUM, HEPARIN PLASMA: Potassium, Heparin Plasma: 5.5 mmol/L — ABNORMAL HIGH (ref 3.5–5.2)

## 2024-09-12 ENCOUNTER — Telehealth: Payer: Self-pay | Admitting: Cardiovascular Disease

## 2024-09-12 DIAGNOSIS — I25810 Atherosclerosis of coronary artery bypass graft(s) without angina pectoris: Secondary | ICD-10-CM

## 2024-09-12 DIAGNOSIS — I480 Paroxysmal atrial fibrillation: Secondary | ICD-10-CM

## 2024-09-12 DIAGNOSIS — R0602 Shortness of breath: Secondary | ICD-10-CM

## 2024-09-12 LAB — CBC

## 2024-09-12 LAB — TSH: TSH: 3.88 u[IU]/mL (ref 0.450–4.500)

## 2024-09-12 LAB — PRO B NATRIURETIC PEPTIDE: NT-Pro BNP: 842 pg/mL — ABNORMAL HIGH (ref 0–486)

## 2024-09-12 LAB — BASIC METABOLIC PANEL WITH GFR
BUN/Creatinine Ratio: 20 (ref 10–24)
BUN: 18 mg/dL (ref 10–36)
CO2: 22 mmol/L (ref 20–29)
Calcium: 9.4 mg/dL (ref 8.6–10.2)
Chloride: 97 mmol/L (ref 96–106)
Creatinine, Ser: 0.88 mg/dL (ref 0.76–1.27)
Glucose: 91 mg/dL (ref 70–99)
Potassium: 5.9 mmol/L (ref 3.5–5.2)
Sodium: 135 mmol/L (ref 134–144)
eGFR: 81 mL/min/1.73 (ref >59)

## 2024-09-12 NOTE — Telephone Encounter (Signed)
Pt calling to f/u; please advise

## 2024-09-12 NOTE — Telephone Encounter (Signed)
Call addressed. 

## 2024-09-12 NOTE — Telephone Encounter (Signed)
  Nessti from patients PCP is call Bernarda back in regards to labs being done at that office, she states new orders will need to be placed for the labs to be done.  Placed new order for cbc

## 2024-09-12 NOTE — Telephone Encounter (Signed)
  Nessti from patients PCP is call Bernarda back in regards to labs being done at that office, she states new orders will need to be placed for the labs to be done.

## 2024-09-15 DIAGNOSIS — R2681 Unsteadiness on feet: Secondary | ICD-10-CM | POA: Diagnosis not present

## 2024-09-15 DIAGNOSIS — M6281 Muscle weakness (generalized): Secondary | ICD-10-CM | POA: Diagnosis not present

## 2024-09-15 DIAGNOSIS — M1612 Unilateral primary osteoarthritis, left hip: Secondary | ICD-10-CM | POA: Diagnosis not present

## 2024-09-17 ENCOUNTER — Encounter (HOSPITAL_COMMUNITY)

## 2024-09-17 ENCOUNTER — Other Ambulatory Visit: Payer: Self-pay | Admitting: Physician Assistant

## 2024-09-17 DIAGNOSIS — L570 Actinic keratosis: Secondary | ICD-10-CM | POA: Diagnosis not present

## 2024-09-17 DIAGNOSIS — D225 Melanocytic nevi of trunk: Secondary | ICD-10-CM | POA: Diagnosis not present

## 2024-09-17 DIAGNOSIS — D1801 Hemangioma of skin and subcutaneous tissue: Secondary | ICD-10-CM | POA: Diagnosis not present

## 2024-09-17 DIAGNOSIS — Z85828 Personal history of other malignant neoplasm of skin: Secondary | ICD-10-CM | POA: Diagnosis not present

## 2024-09-17 DIAGNOSIS — D044 Carcinoma in situ of skin of scalp and neck: Secondary | ICD-10-CM | POA: Diagnosis not present

## 2024-09-17 DIAGNOSIS — I25810 Atherosclerosis of coronary artery bypass graft(s) without angina pectoris: Secondary | ICD-10-CM

## 2024-09-17 DIAGNOSIS — L814 Other melanin hyperpigmentation: Secondary | ICD-10-CM | POA: Diagnosis not present

## 2024-09-17 DIAGNOSIS — B353 Tinea pedis: Secondary | ICD-10-CM | POA: Diagnosis not present

## 2024-09-17 DIAGNOSIS — L821 Other seborrheic keratosis: Secondary | ICD-10-CM | POA: Diagnosis not present

## 2024-09-17 DIAGNOSIS — L578 Other skin changes due to chronic exposure to nonionizing radiation: Secondary | ICD-10-CM | POA: Diagnosis not present

## 2024-09-17 DIAGNOSIS — L720 Epidermal cyst: Secondary | ICD-10-CM | POA: Diagnosis not present

## 2024-09-17 DIAGNOSIS — R0602 Shortness of breath: Secondary | ICD-10-CM

## 2024-09-18 DIAGNOSIS — M1612 Unilateral primary osteoarthritis, left hip: Secondary | ICD-10-CM | POA: Diagnosis not present

## 2024-09-18 DIAGNOSIS — M6281 Muscle weakness (generalized): Secondary | ICD-10-CM | POA: Diagnosis not present

## 2024-09-18 DIAGNOSIS — R2681 Unsteadiness on feet: Secondary | ICD-10-CM | POA: Diagnosis not present

## 2024-09-19 ENCOUNTER — Ambulatory Visit (HOSPITAL_COMMUNITY)
Admission: RE | Admit: 2024-09-19 | Discharge: 2024-09-19 | Disposition: A | Discharge status: Home / Self Care | Source: Ambulatory Visit | Attending: Physician Assistant | Admitting: Physician Assistant

## 2024-09-19 DIAGNOSIS — R0602 Shortness of breath: Secondary | ICD-10-CM | POA: Insufficient documentation

## 2024-09-19 DIAGNOSIS — I25810 Atherosclerosis of coronary artery bypass graft(s) without angina pectoris: Secondary | ICD-10-CM | POA: Insufficient documentation

## 2024-09-19 LAB — MYOCARDIAL PERFUSION IMAGING
Base ST Depression (mm): 0 mm
LV dias vol: 32 mL (ref 62–150)
LV sys vol: 99 mL (ref 4.2–5.8)
Nuc Stress EF: 68 %
Peak HR: 65 {beats}/min
Rest HR: 58 {beats}/min
Rest Nuclear Isotope Dose: 10.3 mCi
SDS: 0
SRS: 6
SSS: 3
ST Depression (mm): 0 mm
Stress Nuclear Isotope Dose: 30.7 mCi
TID: 0.98

## 2024-09-19 MED ORDER — TECHNETIUM TC 99M TETROFOSMIN IV KIT
30.7000 | PACK | Freq: Once | INTRAVENOUS | Status: AC | PRN
Start: 2024-09-19 — End: 2024-09-19
  Administered 2024-09-19: 30.7 via INTRAVENOUS

## 2024-09-19 MED ORDER — REGADENOSON 0.4 MG/5ML IV SOLN
0.4000 mg | Freq: Once | INTRAVENOUS | Status: AC
  Administered 2024-09-19: 0.4 mg via INTRAVENOUS

## 2024-09-19 MED ORDER — TECHNETIUM TC 99M TETROFOSMIN IV KIT
10.3000 | PACK | Freq: Once | INTRAVENOUS | Status: AC | PRN
  Administered 2024-09-19: 10.3 via INTRAVENOUS

## 2024-09-19 MED ORDER — REGADENOSON 0.4 MG/5ML IV SOLN
INTRAVENOUS | Status: AC
  Filled 2024-09-19: qty 5

## 2024-09-21 ENCOUNTER — Encounter: Payer: Self-pay | Admitting: Physician Assistant

## 2024-09-21 ENCOUNTER — Ambulatory Visit: Payer: Self-pay | Admitting: Physician Assistant

## 2024-09-22 ENCOUNTER — Ambulatory Visit: Payer: Self-pay | Admitting: Internal Medicine

## 2024-09-22 ENCOUNTER — Other Ambulatory Visit (INDEPENDENT_AMBULATORY_CARE_PROVIDER_SITE_OTHER)

## 2024-09-22 DIAGNOSIS — E875 Hyperkalemia: Secondary | ICD-10-CM | POA: Diagnosis not present

## 2024-09-22 LAB — BASIC METABOLIC PANEL WITH GFR
BUN: 17 mg/dL (ref 6–23)
CO2: 29 meq/L (ref 19–32)
Calcium: 8.8 mg/dL (ref 8.4–10.5)
Chloride: 98 meq/L (ref 96–112)
Creatinine, Ser: 0.83 mg/dL (ref 0.40–1.50)
GFR: 76.26 mL/min (ref >60.00)
Glucose, Bld: 95 mg/dL (ref 70–99)
Potassium: 4.8 meq/L (ref 3.5–5.1)
Sodium: 134 meq/L — ABNORMAL LOW (ref 135–145)

## 2024-09-24 DIAGNOSIS — C4492 Squamous cell carcinoma of skin, unspecified: Secondary | ICD-10-CM | POA: Insufficient documentation

## 2024-09-25 ENCOUNTER — Telehealth: Payer: Self-pay | Admitting: Internal Medicine

## 2024-09-25 NOTE — Telephone Encounter (Signed)
 Copied from CRM #[id number]. Topic: Clinical - Medical Advice >> Sep 25, 2024 10:35 AM Ashley R wrote: Reason for CRM: sodium below normal, slowly coming down with each blood profile, currently below Low threshold, would like some advice on what to do for this. MyChart or Callback if further questions [phone]

## 2024-09-25 NOTE — Telephone Encounter (Signed)
Spoke w/ Pt- informed of PCP recommendations.  

## 2024-09-25 NOTE — Telephone Encounter (Signed)
No further advice needed.

## 2024-09-26 ENCOUNTER — Ambulatory Visit: Payer: Self-pay

## 2024-09-26 NOTE — Telephone Encounter (Signed)
 Appt scheduled

## 2024-09-26 NOTE — Telephone Encounter (Signed)
 FYI Only or Action Required?: FYI only for provider: appointment scheduled on 11/4.  Patient was last seen in primary care on 09/03/2024 by Eric Mabel Mt, DO.  Called Nurse Triage reporting Leg Pain.  Symptoms began several months ago.  Interventions attempted: Rest, hydration, or home remedies.  Symptoms are: stable.  Triage Disposition: See PCP Within 2 Weeks  Patient/caregiver understands and will follow disposition?: Yes Reason for Disposition  [1] MILD pain (e.g., does not interfere with normal activities) AND [2] present > 7 days  Answer Assessment - Initial Assessment Questions Patient stated he developed arthritis in left hip. Scheduled with Dr. [name] 11/4   1. ONSET: When did the pain start?      Several weeks  2. LOCATION: Where is the pain located?      Both legs all over and into feet  3. PAIN: How bad is the pain?    (Scale 1-10; or mild, moderate, severe)     Annoying more than painful, but states something is wrong  4. WORK OR EXERCISE: Has there been any recent work or exercise that involved this part of the body?      Denies  5. CAUSE: What do you think is causing the leg pain?     Unsure  6. OTHER SYMPTOMS: Do you have any other symptoms? (e.g., chest pain, back pain, breathing difficulty, swelling, rash, fever, numbness, weakness)     Legs feel heavy, disrupts balance, keeping awake at night, sometimes they feel cold and then hot.  Protocols used: Leg Pain-A-AH  Copied from CRM [id number]. Topic: Clinical - Red Word Triage >> Sep 26, 2024  3:19 PM Rea C wrote: Red Word that prompted transfer to Nurse Triage: Leg pain for several weeks- have tingling in lower part of legs and even at night, they feel hot. During the day when trying to walk, they feel heavy.

## 2024-09-30 ENCOUNTER — Ambulatory Visit (INDEPENDENT_AMBULATORY_CARE_PROVIDER_SITE_OTHER): Admitting: Family Medicine

## 2024-09-30 ENCOUNTER — Encounter: Payer: Self-pay | Admitting: Family Medicine

## 2024-09-30 VITALS — BP 130/68 | HR 53 | Temp 98.0°F | Resp 16 | Ht 74.0 in | Wt 150.2 lb

## 2024-09-30 DIAGNOSIS — M792 Neuralgia and neuritis, unspecified: Secondary | ICD-10-CM

## 2024-09-30 MED ORDER — DULOXETINE HCL 20 MG PO CPEP: 20.0000 mg | ORAL_CAPSULE | Freq: Every day | ORAL | 0 refills | Status: DC

## 2024-09-30 NOTE — Progress Notes (Signed)
 Musculoskeletal Exam  Patient: [name] DOB: 1930/06/07  DOS: 09/30/2024  SUBJECTIVE:  Chief Complaint:   Chief Complaint  Patient presents with   Leg Pain    Leg Pain    [name] is a 89 y.o.  male for evaluation and treatment of bilateral thigh/leg pain.   Onset:  8 months ago. No inj or change in activity.  Location: around mid thighs down to feet Character:  burning  Progression of issue:  is unchanged Associated symptoms: affecting his ability to walk No bruising, redness, swelling, Tylenol  Treatment: to date has been: lotion.   Neurovascular symptoms: no  Past Medical History:  Diagnosis Date   Anxiety    Atrial fibrillation (HCC)    Blood transfusion without reported diagnosis    CAD    a. 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 // Myoview  9/22: EF 57, normal perfusion; low risk // Lexiscan  MPI 09/19/24: no ischemia or infarction, EF 68, low risk   Cataract    Chronic abdominal pain    Chronic nausea    Coronary artery disease    Diverticulosis 2011   Diverticulitis 2004   Dyspepsia    Esophageal reflux    GERD (gastroesophageal reflux disease)    Headache    saw neuro 4-16, MRI done   History of echocardiogram    Echo 9/22: EF 60-65, no RWMA, normal diastolic function, GLS -24.9, normal RVSF, normal PASP, mild-moderate BAE, trivial MR, moderate MAC, moderate AV calcification, mild AI, mild aortic stenosis (mean gradient 5 mmHg, V-max 158 cm/s, DI 0.62), mild dilation of ascending aorta (40 mm)   HYPERPLASIA, PROSTATE NOS W/URINARY OBST/LUTS    IBS    INGUINAL HERNIA    Lichen planus 2006   Niels Hoover MD   LUMBAR RADICULOPATHY, RIGHT    Mixed hyperlipidemia    OSTEOPOROSIS    PONV (postoperative nausea and vomiting)    PREMATURE VENTRICULAR CONTRACTIONS    a. After CABG - was on Coumadin/Multaq  for a period of time. Coumadin discontinued 04/2010 after maintaining NSR.    UNSPECIFIED ANEMIA     Objective: VITAL SIGNS: BP 130/68 (BP Location: Left Arm, Patient Position: Sitting)   Pulse (!) 53   Temp 98 F (36.7 C) (Oral)   Resp 16   Ht 6' 2 (1.88 m)   Wt 150 lb 3.2 oz (68.1 kg)   SpO2 96%   BMI 19.28 kg/m  Constitutional: Well formed, well developed. No acute distress. Thorax & Lungs: No accessory muscle use Musculoskeletal: Lower extremities.   Normal active range of motion: yes.   Normal passive range of motion: yes Tenderness to palpation: Yes circumferentially around mid thigh down to the ankles bilaterally Deformity: no Ecchymosis: no There is no TTP over the lower back Neurologic: Normal sensory function. No focal deficits noted. DTR's equal and symmetric in LE's. No clonus. Psychiatric: Normal mood. Age appropriate judgment and insight. Alert & oriented x 3.    Assessment:  Neuropathic pain - Plan: Magnesium, DULoxetine (CYMBALTA) 20 MG capsule  Plan: Chronic, not controlled.  Check a magnesium level.  Start duloxetine 20 mg daily, heat, ice, Tylenol  as needed.  F/u in 1 month to recheck, could consider referral to neurology if still no improvement. The patient voiced understanding and agreement to the plan.   Mabel Mt [place], DO 09/30/24  4:35 PM

## 2024-09-30 NOTE — Patient Instructions (Signed)
Give [place] 2-3 business days to get the results of your labs back.   OK to take Tylenol 1000 mg (2 extra strength tabs) or 975 mg (3 regular strength tabs) every 6 hours as needed.  Ice/cold pack over area for 10-15 min twice daily.  Heat (pad or rice pillow in microwave) over affected area, 10-15 minutes twice daily.   Let us know if you need anything.

## 2024-10-01 ENCOUNTER — Ambulatory Visit: Payer: Self-pay | Admitting: Family Medicine

## 2024-10-01 LAB — MAGNESIUM: Magnesium: 2.1 mg/dL (ref 1.5–2.5)

## 2024-10-06 ENCOUNTER — Ambulatory Visit

## 2024-10-06 VITALS — BP 114/64 | HR 55 | Temp 97.7°F | Ht 74.0 in | Wt 146.0 lb

## 2024-10-06 DIAGNOSIS — R0609 Other forms of dyspnea: Secondary | ICD-10-CM

## 2024-10-06 DIAGNOSIS — R053 Chronic cough: Secondary | ICD-10-CM | POA: Diagnosis not present

## 2024-10-06 DIAGNOSIS — J849 Interstitial pulmonary disease, unspecified: Secondary | ICD-10-CM | POA: Diagnosis not present

## 2024-10-06 LAB — HEPATIC FUNCTION PANEL
ALT: 26 U/L (ref 0–53)
AST: 33 U/L (ref 0–37)
Albumin: 4.2 g/dL (ref 3.5–5.2)
Alkaline Phosphatase: 53 U/L (ref 39–117)
Bilirubin, Direct: 0.1 mg/dL (ref 0.0–0.3)
Total Bilirubin: 0.5 mg/dL (ref 0.2–1.2)
Total Protein: 7.2 g/dL (ref 6.0–8.3)

## 2024-10-06 LAB — CBC WITH DIFFERENTIAL/PLATELET
Basophils Absolute: 0.2 K/uL — ABNORMAL HIGH (ref 0.0–0.1)
Basophils Relative: 1.4 % (ref 0.0–3.0)
Eosinophils Absolute: 0.2 K/uL (ref 0.0–0.7)
Eosinophils Relative: 1.4 % (ref 0.0–5.0)
HCT: 40.8 % (ref 39.0–52.0)
Hemoglobin: 13.6 g/dL (ref 13.0–17.0)
Lymphocytes Relative: 10.3 % — ABNORMAL LOW (ref 12.0–46.0)
Lymphs Abs: 1.1 K/uL (ref 0.7–4.0)
MCHC: 33.3 g/dL (ref 30.0–36.0)
MCV: 95.5 fl (ref 78.0–100.0)
Monocytes Absolute: 1 K/uL (ref 0.1–1.0)
Monocytes Relative: 9.4 % (ref 3.0–12.0)
Neutro Abs: 8.5 K/uL — ABNORMAL HIGH (ref 1.4–7.7)
Neutrophils Relative %: 77.5 % — ABNORMAL HIGH (ref 43.0–77.0)
Platelets: 236 K/uL (ref 150.0–400.0)
RBC: 4.27 Mil/uL (ref 4.22–5.81)
RDW: 16 % — ABNORMAL HIGH (ref 11.5–15.5)
WBC: 11 K/uL — ABNORMAL HIGH (ref 4.0–10.5)

## 2024-10-06 LAB — BASIC METABOLIC PANEL WITH GFR
BUN: 25 mg/dL — ABNORMAL HIGH (ref 6–23)
CO2: 30 meq/L (ref 19–32)
Calcium: 9.4 mg/dL (ref 8.4–10.5)
Chloride: 96 meq/L (ref 96–112)
Creatinine, Ser: 0.96 mg/dL (ref 0.40–1.50)
GFR: 68.86 mL/min (ref >60.00)
Glucose, Bld: 108 mg/dL — ABNORMAL HIGH (ref 70–99)
Potassium: 5.2 meq/L — ABNORMAL HIGH (ref 3.5–5.1)
Sodium: 133 meq/L — ABNORMAL LOW (ref 135–145)

## 2024-10-06 LAB — SEDIMENTATION RATE: Sed Rate: 27 mm/h — ABNORMAL HIGH (ref 0–20)

## 2024-10-06 LAB — C-REACTIVE PROTEIN: CRP: 1 mg/dL (ref 0.5–20.0)

## 2024-10-06 NOTE — Progress Notes (Signed)
 New Patient Pulmonology Office Visit   Subjective:  Patient ID: [name], male    DOB: [birth year]  MRN: [id number]  Referred by: Lelon Glendia DASEN, PA-C  CC: No chief complaint on file.   HPI [name] is a 89 y.o. male with   Discussed the use of AI scribe software for clinical note transcription with the patient, who gave verbal consent to proceed.  History of Present Illness [name] is a 89 year old male with CAD s/p CABG, afib on amiodarone , former smoker (quitted on 1963) who presents with worsening shortness of breath. He was referred by his cardiologist for evaluation of lung changes and medication review.  Over the past two to three months, he experiences worsening shortness of breath, initially during physical activities like walking, now present even when walking across a room. He notes increased 'puffing' and significant changes in recent months.  He quit smoking in 1963. Recent cardiac evaluations, including echocardiogram, EKG, and stress test, were normal for his age. He takes amiodarone  for atrial fibrillation prevention, with no current AFib after a cardioversion over a year ago and cardiology he will may consider to stop it.  He has a morning cough producing mostly clear mucus, occasionally yellowish, for about a year or two, with no significant worsening. No history of autoimmune diseases, cancer, or exposure to mold or pets at home. No history of pulmonary conditions.  Social hx: -Drugs with lung toxicity: amiodarone  -Denied chemo, radiation -No bird exposure. No pets -No mold on the assisted living facility -No history of AI diseases.  ROS as above  Allergies: Amoxicillin , Zoloft  [sertraline  hcl], and Remeron  [mirtazapine ]  Current Outpatient Medications:    acetaminophen  (TYLENOL ) 650 MG CR tablet, Take 325-650 mg by mouth every 8 (eight) hours as needed for pain (Arthritis)., Disp: , Rfl:    amiodarone  (PACERONE ) 200 MG tablet, Take 1 tablet (200 mg  total) by mouth daily., Disp: 90 tablet, Rfl: 3   calcium  carbonate (TUMS - DOSED IN MG ELEMENTAL CALCIUM ) 500 MG chewable tablet, Chew 500 mg by mouth daily., Disp: , Rfl:    Cholecalciferol  (VITAMIN D -3) 25 MCG (1000 UT) CAPS, Take 1,000 Units by mouth daily with breakfast., Disp: , Rfl:    denosumab  (PROLIA ) 60 MG/ML SOSY injection, Inject 60 mg into the skin every 6 (six) months., Disp: , Rfl:    docusate sodium  (COLACE) 100 MG capsule, Take 1 capsule (100 mg total) by mouth 2 (two) times daily., Disp: 180 capsule, Rfl: 1   DULoxetine (CYMBALTA) 20 MG capsule, Take 1 capsule (20 mg total) by mouth daily., Disp: 30 capsule, Rfl: 0   ELIQUIS  5 MG TABS tablet, TAKE 1 TABLET BY MOUTH TWICE DAILY, Disp: 60 tablet, Rfl: 5   famotidine -calcium  carbonate-magnesium hydroxide (PEPCID  COMPLETE) 10-800-165 MG chewable tablet, Chew 1 tablet by mouth daily as needed (indigestion)., Disp: , Rfl:    fluticasone  (FLONASE ) 50 MCG/ACT nasal spray, Place 2 sprays into both nostrils daily., Disp: 16 g, Rfl: 1   folic acid  (FOLVITE ) 1 MG tablet, TAKE 1 TABLET (1 MG TOTAL) BY MOUTH DAILY., Disp: 90 tablet, Rfl: 0   Glycerin-Hypromellose-PEG 400 (DRY EYE RELIEF DROPS) 0.2-0.2-1 % SOLN, Place 1 drop into both eyes every morning. Lubricating eyes, Disp: , Rfl:    levocetirizine (XYZAL) 5 MG tablet, Take 1 tablet (5 mg total) by mouth every evening., Disp: 30 tablet, Rfl: 2   Multiple Vitamin (MULITIVITAMIN WITH MINERALS) TABS, Take 1 tablet by mouth daily.,  Disp: , Rfl:    nitroGLYCERIN  (NITROSTAT ) 0.4 MG SL tablet, DISSOLVE 1 TABLET UNDER TONGUE EVERY 5 MINUTES FOR UP TO 3 DOSES AS NEEDED FOR CHEST PAIN, Disp: 25 tablet, Rfl: 9   rosuvastatin  (CRESTOR ) 10 MG tablet, Take 1 tablet (10 mg total) by mouth daily., Disp: 90 tablet, Rfl: 3  Current Facility-Administered Medications:    [START ON 11/01/2024] denosumab  (PROLIA ) injection 60 mg, 60 mg, Subcutaneous, Once, Trixie File, MD Past Medical History:  Diagnosis  Date   Anxiety    Atrial fibrillation (HCC)    Blood transfusion without reported diagnosis    CAD    a. 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 // Myoview  9/22: EF 57, normal perfusion; low risk // Lexiscan  MPI 09/19/24: no ischemia or infarction, EF 68, low risk   Cataract    Chronic abdominal pain    Chronic nausea    Coronary artery disease    Diverticulosis 2011   Diverticulitis 2004   Dyspepsia    Esophageal reflux    GERD (gastroesophageal reflux disease)    Headache    saw neuro 4-16, MRI done   History of echocardiogram    Echo 9/22: EF 60-65, no RWMA, normal diastolic function, GLS -24.9, normal RVSF, normal PASP, mild-moderate BAE, trivial MR, moderate MAC, moderate AV calcification, mild AI, mild aortic stenosis (mean gradient 5 mmHg, V-max 158 cm/s, DI 0.62), mild dilation of ascending aorta (40 mm)   HYPERPLASIA, PROSTATE NOS W/URINARY OBST/LUTS    IBS    INGUINAL HERNIA    Lichen planus 2006   Niels Hoover MD   LUMBAR RADICULOPATHY, RIGHT    Mixed hyperlipidemia    OSTEOPOROSIS    PONV (postoperative nausea and vomiting)    PREMATURE VENTRICULAR CONTRACTIONS    a. After CABG - was on Coumadin/Multaq  for a period of time. Coumadin discontinued 04/2010 after maintaining NSR.   UNSPECIFIED ANEMIA    Past Surgical History:  Procedure Laterality Date   BIOPSY  09/10/2018   Procedure: BIOPSY;  Surgeon: Albertus Gordy HERO, MD;  Location: [facility];  Service: Gastroenterology;;   CARDIOVERSION N/A 08/31/2023   Procedure: CARDIOVERSION;  Surgeon: Michele Richardson, DO;  Location: [facility];  Service: Cardiovascular;  Laterality: N/A;   CATARACT EXTRACTION Right 10/06/2013   CORONARY ARTERY BYPASS GRAFT     2 VD;anomalous RCA;post op compliacted by AF   ESOPHAGOGASTRODUODENOSCOPY (EGD) WITH PROPOFOL  N/A 09/10/2018   Procedure: ESOPHAGOGASTRODUODENOSCOPY (EGD) WITH PROPOFOL ;  Surgeon: Albertus Gordy HERO, MD;  Location: [facility];  Service: Gastroenterology;  Laterality: N/A;   HIP FRACTURE SURGERY Right 03/2007   Dr. Rollie   INGUINAL HERNIA REPAIR Left 2012   Dr Vanderbilt   KNEE SURGERY Right 1977   repair   PROSTATE BIOPSY  09/19/2013   [facility]   Family History  Problem Relation Age of Onset   Stroke Mother    Pancreatic cancer Father        Deceased, 24   Breast cancer Sister        Deceased   Heart disease Sister 46   Stroke Maternal Aunt    Colon cancer Neg Hx    Prostate cancer Neg Hx    Esophageal cancer Neg Hx    Rectal cancer Neg Hx    Stomach cancer Neg Hx    Social History   Socioeconomic History   Marital status: Legally Separated  Spouse name: Not on file   Number of children: 0   Years of education: Not on file   Highest education level: Not on file  Occupational History   Occupation: retired    Associate Professor: RETIRED  Tobacco Use   Smoking status: Former    Current packs/day: 0.00    Types: Cigarettes    Quit date: 11/27/1962    Years since quitting: 61.9   Smokeless tobacco: Never  Vaping Use   Vaping status: Never Used  Substance and Sexual Activity   Alcohol use: Not Currently    Alcohol/week: 0.0 standard drinks of alcohol    Comment: quit drinking    Drug use: No   Sexual activity: Not Currently  Other Topics Concern   Not on file  Social History Narrative   Lives at [facility] in independent living.     Lost wife 06-2016.     Has no children.  Family: nephews-nices in [place]   Retired from [employer]     Still drives sometimes.    Social Drivers of Corporate Investment Banker Strain: Low Risk  (08/05/2024)   Overall Financial Resource Strain (CARDIA)    Difficulty of Paying Living Expenses: Not hard at all  Food Insecurity: No Food Insecurity (08/05/2024)   Hunger Vital Sign    Worried About Running Out of Food in the Last Year: Never true    Ran Out of Food in the Last Year: Never true  Transportation Needs: No  Transportation Needs (08/05/2024)   PRAPARE - Administrator, Civil Service (Medical): No    Lack of Transportation (Non-Medical): No  Physical Activity: Insufficiently Active (08/05/2024)   Exercise Vital Sign    Days of Exercise per Week: 3 days    Minutes of Exercise per Session: 30 min  Stress: No Stress Concern Present (08/05/2024)   [employer] of Occupational Health - Occupational Stress Questionnaire    Feeling of Stress: Not at all  Social Connections: Moderately Integrated (08/05/2024)   Social Connection and Isolation Panel    Frequency of Communication with Friends and Family: Once a week    Frequency of Social Gatherings with Friends and Family: Twice a week    Attends Religious Services: More than 4 times per year    Active Member of [employer] or Organizations: Yes    Attends Banker Meetings: More than 4 times per year    Marital Status: Widowed  Intimate Partner Violence: Not At Risk (08/05/2024)   Humiliation, Afraid, Rape, and Kick questionnaire    Fear of Current or Ex-Partner: No    Emotionally Abused: No    Physically Abused: No    Sexually Abused: No       Objective:  There were no vitals taken for this visit. Wt Readings from Last 3 Encounters:  10/06/24 146 lb (66.2 kg)  09/30/24 150 lb 3.2 oz (68.1 kg)  09/19/24 150 lb (68 kg)   BMI Readings from Last 3 Encounters:  10/06/24 18.75 kg/m  09/30/24 19.28 kg/m  09/19/24 19.26 kg/m   SpO2 Readings from Last 3 Encounters:  10/06/24 97%  09/30/24 96%  09/09/24 95%    Physical Exam Constitutional:      Appearance: Normal appearance.  HENT:     Head: Normocephalic.  Eyes:     Extraocular Movements: Extraocular movements intact.     Pupils: Pupils are equal, round, and reactive to light.  Cardiovascular:     Rate and Rhythm: Normal rate  and regular rhythm.  Pulmonary:     Effort: Pulmonary effort is normal.     Comments: Crackles present on the bases, no  wheezing Musculoskeletal:     Comments: Limited mobility. Use a rollator for walking. No edema  Skin:    General: Skin is warm and dry.  Neurological:     General: No focal deficit present.     Mental Status: He is alert and oriented to person, place, and time.    CXR 09/11/2024 1. No acute cardiopulmonary abnormality. 2. Chronic interstitial lung disease. 3. Apical lung scarring.   CT chest 2023 COPD. There are patchy linear densities and increased interstitial markings in both lungs appear stable. There are no new infiltrates or new nodules. Coronary artery disease. There is ectasia of ascending thoracic aorta measuring 3.8 cm. Overall, no significant interval changes are noted.  PFTs 2021: Normal FVC, FEV1, F/F, TLC, DLCO. No BD repsonse    Assessment & Plan:   Assessment & Plan Progressive interstitial lung disease  Chronic cough and worsening dyspnea on exertion. Suspected amiodarone -induced pulmonary toxicity Progressive interstitial lung disease with increased changes on recent chest x-ray. In 2023 he had very mild reticular opacities. His PFT in 2021 was normal. 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 Denied AI disease, environmental exposure. I have concerns about treatment options due to side effects given his age.  - Ordered chest CT to evaluate lung changes. - Ordered blood tests for autoimmune diseases. - Will discuss therapy options based on CT findings. - Will contact cardiologist to discuss stopping amiodarone . I will send a mychart message today. - Provided questionnaire for environmental exposures. - Scheduled follow-up in 6 weeks to discuss results and management.    Return in about 6 weeks (around 11/17/2024).    Marny Patch, MD Pulmonary and Critical Care Medicine [facility]

## 2024-10-06 NOTE — Patient Instructions (Signed)
 Dear [name];  Your chest xray is concerning for interstial lung disease. I will do a CT scan to better evaluation of your lungs, as well blood test.  I will talk with the cardiologist to see if it is ok to stop the amiodarone , I will give you a call when I get his answer.  If your symptoms become worst, with severe shortness of breath, please don't hesitate to call 911.  Please, bring the questionnaire back in 6 weeks.  I will see you in 6 weeks.

## 2024-10-07 ENCOUNTER — Other Ambulatory Visit: Payer: Self-pay | Admitting: Physician Assistant

## 2024-10-08 ENCOUNTER — Ambulatory Visit: Payer: Self-pay

## 2024-10-08 LAB — QUANTIFERON-TB GOLD PLUS
Mitogen-NIL: 5.61 [IU]/mL
NIL: 0.01 [IU]/mL
QuantiFERON-TB Gold Plus: NEGATIVE
TB1-NIL: 0 [IU]/mL
TB2-NIL: 0 [IU]/mL

## 2024-10-10 ENCOUNTER — Ambulatory Visit (HOSPITAL_BASED_OUTPATIENT_CLINIC_OR_DEPARTMENT_OTHER)
Admission: RE | Admit: 2024-10-10 | Discharge: 2024-10-10 | Disposition: A | Discharge status: Home / Self Care | Source: Ambulatory Visit

## 2024-10-10 DIAGNOSIS — J849 Interstitial pulmonary disease, unspecified: Secondary | ICD-10-CM | POA: Insufficient documentation

## 2024-10-12 LAB — ANTI-DNA ANTIBODY, DOUBLE-STRANDED: ds DNA Ab: <1 [IU]/mL

## 2024-10-12 LAB — CARDIO IQ® NT PROBNP: NT PROBNP: 543 pg/mL — ABNORMAL HIGH (ref <450)

## 2024-10-12 LAB — RHEUMATOID FACTOR: Rheumatoid fact SerPl-aCnc: <10 [IU]/mL (ref <14)

## 2024-10-12 LAB — CYCLIC CITRUL PEPTIDE ANTIBODY, IGG: Cyclic Citrullin Peptide Ab: <16 U

## 2024-10-12 LAB — ANA: Anti Nuclear Antibody (ANA): NEGATIVE

## 2024-10-13 ENCOUNTER — Other Ambulatory Visit: Payer: Self-pay | Admitting: Cardiovascular Disease

## 2024-10-13 NOTE — Telephone Encounter (Signed)
 Prolia VOB initiated via [email or web address]  Next Prolia inj DUE: 12/24/23

## 2024-10-15 ENCOUNTER — Telehealth: Payer: Self-pay | Admitting: Cardiovascular Disease

## 2024-10-15 NOTE — Telephone Encounter (Signed)
*  STAT* If patient is at the pharmacy, call can be transferred to refill team.   1. Which medications need to be refilled? (please list name of each medication and dose if known) rosuvastatin  (CRESTOR ) 10 MG tablet    2. Would you like to learn more about the convenience, safety, & potential cost savings by using the [facility]? No    3. Are you open to using the [facility] (Type [facility].  ). No    4. Which pharmacy/location (including street and city if local pharmacy) is medication to be sent to?[facility] - [place], [place] - [address]    5. Do they need a 30 day or 90 day supply? 90 day    Pharmacy called pt is out of medication

## 2024-10-16 ENCOUNTER — Other Ambulatory Visit: Payer: Self-pay

## 2024-10-16 MED ORDER — ROSUVASTATIN CALCIUM 10 MG PO TABS: 10.0000 mg | ORAL_TABLET | Freq: Every day | ORAL | 3 refills | Status: AC

## 2024-10-16 MED ORDER — PREDNISONE 10 MG PO TABS: ORAL_TABLET | ORAL | 0 refills | Status: AC

## 2024-10-16 MED ORDER — DOXYCYCLINE HYCLATE 100 MG PO TABS: 100.0000 mg | ORAL_TABLET | Freq: Two times a day (BID) | ORAL | 0 refills | Status: AC

## 2024-10-16 NOTE — Telephone Encounter (Signed)
 Refill sent.

## 2024-10-26 NOTE — Telephone Encounter (Signed)
 [employer] - Prior Authorization REQUIRED for [employer]

## 2024-11-02 NOTE — Telephone Encounter (Signed)
 [employer]  Prior Authorization for PROLIA  APPROVED PA# [id number] Valid: 11/02/24-11/02/25

## 2024-11-03 ENCOUNTER — Telehealth: Payer: Self-pay | Admitting: Cardiovascular Disease

## 2024-11-04 ENCOUNTER — Ambulatory Visit

## 2024-11-06 NOTE — Telephone Encounter (Signed)
 Pharmacy called in for an update on this request.

## 2024-11-06 NOTE — Telephone Encounter (Signed)
 Prescription sent

## 2024-11-07 ENCOUNTER — Ambulatory Visit

## 2024-11-07 DIAGNOSIS — M810 Age-related osteoporosis without current pathological fracture: Secondary | ICD-10-CM

## 2024-11-07 NOTE — Progress Notes (Signed)
 Pt was in office today for prolia  injection given in the right arm with no problem. Advise pt to wait in lobby in case of a reaction.

## 2024-11-09 NOTE — Telephone Encounter (Signed)
 [employer]  Patient is ready for scheduling on or after 11/02/24  Out-of-pocket cost due at time of visit: $372.56  Primary: UHC AARP Medicare Advantage HMO-POS Prolia  co-insurance: 20% (approximately $331.87) Admin fee co-insurance: 20% (approximately $20)  Deductible: does not apply  Prior Auth: APPROVED PA# [id number] Valid: 11/02/24-11/02/25  Secondary: N/A Prolia  co-insurance:  Admin fee co-insurance:  Deductible:  Prior Auth:  PA# Valid:   ** This summary of benefits is an estimation of the patient's out-of-pocket cost. Exact cost may vary based on individual plan coverage.

## 2024-11-09 NOTE — Telephone Encounter (Signed)
 Last Prolia  inj 11/07/24 Next Prolia  inj due 05/09/25

## 2024-11-26 ENCOUNTER — Ambulatory Visit

## 2024-12-03 NOTE — Progress Notes (Signed)
 "  New Patient Pulmonology Office Visit   Subjective:  Patient ID: [name], male    DOB: 1930/03/01  MRN: [id number]  Referred by: Amon Aloysius BRAVO, MD  CC: No chief complaint on file.   Discussed the use of AI scribe software for clinical note transcription with the patient, who gave verbal consent to proceed.  History of Present Illness [name] is a 89 year old male with CAD s/p CABG, afib on amiodarone , former smoker (quitted on 1963) who presents with worsening shortness of breath. He was referred by his cardiologist for evaluation of lung changes and amiodarone  toxicity. Since 07/2024, he experiences worsening shortness of breath, initially during physical activities like walking, now present even when walking across a room. Recent cardiac evaluations, including echocardiogram, EKG, and stress test, were normal for his age. He takes amiodarone  for atrial fibrillation prevention, with no current AFib after a cardioversion over a year ago. After extensive evaluation, AI work up was negative, ILD questionnaire negative, no significant environmental exposures. His CT scan showed increase ggos, and fibrotic changes in the lower lobes, and the only exposure identifiable was amiodarone , and cardiologist stopped it. He was dealing with acute bronchitis so I send some doxycycline  and prednisone  for 10 days on 09/2024.  No history of autoimmune diseases, cancer, or exposure to mold or pets at home. No history of pulmonary conditions.  Social hx: -Drugs with lung toxicity: amiodarone  -Denied chemo, radiation -No bird exposure. No pets -No mold on the assisted living facility -No history of AI diseases.  Interval history  Patient report his bronchitis resolved after prednisone  and antibiotics He has some dyspnea on exertion but he is able to walk and dance with much improvement.He has better mobility using only a cane. He stopped taking amiodarone . I mentioned amiodarone  has a long life and it  will take some months to ware off.   ROS as above  Allergies: Amoxicillin , Zoloft  [sertraline  hcl], and Remeron  [mirtazapine ]  Current Outpatient Medications:    acetaminophen  (TYLENOL ) 650 MG CR tablet, Take 325-650 mg by mouth every 8 (eight) hours as needed for pain (Arthritis)., Disp: , Rfl:    calcium  carbonate (TUMS - DOSED IN MG ELEMENTAL CALCIUM ) 500 MG chewable tablet, Chew 500 mg by mouth daily., Disp: , Rfl:    Cholecalciferol  (VITAMIN D -3) 25 MCG (1000 UT) CAPS, Take 1,000 Units by mouth daily with breakfast., Disp: , Rfl:    denosumab  (PROLIA ) 60 MG/ML SOSY injection, Inject 60 mg into the skin every 6 (six) months., Disp: , Rfl:    docusate sodium  (COLACE) 100 MG capsule, Take 1 capsule (100 mg total) by mouth 2 (two) times daily., Disp: 180 capsule, Rfl: 1   DULoxetine  (CYMBALTA ) 20 MG capsule, Take 1 capsule (20 mg total) by mouth daily. (Patient not taking: Reported on 10/06/2024), Disp: 30 capsule, Rfl: 0   ELIQUIS  5 MG TABS tablet, TAKE 1 TABLET BY MOUTH TWICE DAILY, Disp: 60 tablet, Rfl: 5   famotidine -calcium  carbonate-magnesium hydroxide (PEPCID  COMPLETE) 10-800-165 MG chewable tablet, Chew 1 tablet by mouth daily as needed (indigestion)., Disp: , Rfl:    fluticasone  (FLONASE ) 50 MCG/ACT nasal spray, Place 2 sprays into both nostrils daily. (Patient not taking: Reported on 10/06/2024), Disp: 16 g, Rfl: 1   folic acid  (FOLVITE ) 1 MG tablet, TAKE 1 TABLET (1 MG TOTAL) BY MOUTH DAILY., Disp: 90 tablet, Rfl: 0   Glycerin-Hypromellose-PEG 400 (DRY EYE RELIEF DROPS) 0.2-0.2-1 % SOLN, Place 1 drop into both eyes every  morning. Lubricating eyes, Disp: , Rfl:    levocetirizine (XYZAL ) 5 MG tablet, Take 1 tablet (5 mg total) by mouth every evening. (Patient not taking: Reported on 10/06/2024), Disp: 30 tablet, Rfl: 2   Multiple Vitamin (MULITIVITAMIN WITH MINERALS) TABS, Take 1 tablet by mouth daily., Disp: , Rfl:    nitroGLYCERIN  (NITROSTAT ) 0.4 MG SL tablet, DISSOLVE 1 TABLET UNDER  TONGUE EVERY 5 MINUTES FOR UP TO 3 DOSES AS NEEDED FOR CHEST PAIN (Patient not taking: Reported on 10/06/2024), Disp: 25 tablet, Rfl: 9   rosuvastatin  (CRESTOR ) 10 MG tablet, Take 1 tablet (10 mg total) by mouth daily., Disp: 90 tablet, Rfl: 3 Past Medical History:  Diagnosis Date   Anxiety    Atrial fibrillation (HCC)    Blood transfusion without reported diagnosis    CAD    a. 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 // Myoview  9/22: EF 57, normal perfusion; low risk // Lexiscan  MPI 09/19/24: no ischemia or infarction, EF 68, low risk   Cataract    Chronic abdominal pain    Chronic nausea    Coronary artery disease    Diverticulosis 2011   Diverticulitis 2004   Dyspepsia    Esophageal reflux    GERD (gastroesophageal reflux disease)    Headache    saw neuro 4-16, MRI done   History of echocardiogram    Echo 9/22: EF 60-65, no RWMA, normal diastolic function, GLS -24.9, normal RVSF, normal PASP, mild-moderate BAE, trivial MR, moderate MAC, moderate AV calcification, mild AI, mild aortic stenosis (mean gradient 5 mmHg, V-max 158 cm/s, DI 0.62), mild dilation of ascending aorta (40 mm)   HYPERPLASIA, PROSTATE NOS W/URINARY OBST/LUTS    IBS    INGUINAL HERNIA    Lichen planus 2006   Niels Hoover MD   LUMBAR RADICULOPATHY, RIGHT    Mixed hyperlipidemia    OSTEOPOROSIS    PONV (postoperative nausea and vomiting)    PREMATURE VENTRICULAR CONTRACTIONS    a. After CABG - was on Coumadin/Multaq  for a period of time. Coumadin discontinued 04/2010 after maintaining NSR.   UNSPECIFIED ANEMIA    Past Surgical History:  Procedure Laterality Date   BIOPSY  09/10/2018   Procedure: BIOPSY;  Surgeon: Albertus Gordy HERO, MD;  Location: [facility];  Service: Gastroenterology;;   CARDIOVERSION N/A 08/31/2023   Procedure: CARDIOVERSION;  Surgeon: Michele Richardson, DO;  Location: [facility];  Service: Cardiovascular;   Laterality: N/A;   CATARACT EXTRACTION Right 10/06/2013   CORONARY ARTERY BYPASS GRAFT     2 VD;anomalous RCA;post op compliacted by AF   ESOPHAGOGASTRODUODENOSCOPY (EGD) WITH PROPOFOL  N/A 09/10/2018   Procedure: ESOPHAGOGASTRODUODENOSCOPY (EGD) WITH PROPOFOL ;  Surgeon: Albertus Gordy HERO, MD;  Location: [facility];  Service: Gastroenterology;  Laterality: N/A;   HIP FRACTURE SURGERY Right 03/2007   Dr. Rollie   INGUINAL HERNIA REPAIR Left 2012   Dr Vanderbilt   KNEE SURGERY Right 1977   repair   PROSTATE BIOPSY  09/19/2013   [facility]   Family History  Problem Relation Age of Onset   Stroke Mother    Pancreatic cancer Father        Deceased, 75   Breast cancer Sister        Deceased   Heart disease Sister 66   Stroke Maternal Aunt    Colon cancer Neg Hx    Prostate cancer Neg Hx    Esophageal cancer  Neg Hx    Rectal cancer Neg Hx    Stomach cancer Neg Hx    Social History   Socioeconomic History   Marital status: Legally Separated    Spouse name: Not on file   Number of children: 0   Years of education: Not on file   Highest education level: Not on file  Occupational History   Occupation: retired    Associate Professor: RETIRED  Tobacco Use   Smoking status: Former    Current packs/day: 0.00    Types: Cigarettes    Quit date: 11/27/1962    Years since quitting: 62.0   Smokeless tobacco: Never  Vaping Use   Vaping status: Never Used  Substance and Sexual Activity   Alcohol use: Not Currently    Alcohol/week: 0.0 standard drinks of alcohol    Comment: quit drinking    Drug use: No   Sexual activity: Not Currently  Other Topics Concern   Not on file  Social History Narrative   Lives at [facility] in independent living.     Lost wife 06-2016.     Has no children.  Family: nephews-nices in [place]   Retired from [employer].     Still drives sometimes.    Social Drivers of Health   Tobacco Use: Medium Risk (10/06/2024)   Patient History    Smoking Tobacco  Use: Former    Smokeless Tobacco Use: Never    Passive Exposure: Not on file  Financial Resource Strain: Low Risk (08/05/2024)   Overall Financial Resource Strain (CARDIA)    Difficulty of Paying Living Expenses: Not hard at all  Food Insecurity: No Food Insecurity (08/05/2024)   Epic    Worried About Programme Researcher, Broadcasting/film/video in the Last Year: Never true    Ran Out of Food in the Last Year: Never true  Transportation Needs: No Transportation Needs (08/05/2024)   Epic    Lack of Transportation (Medical): No    Lack of Transportation (Non-Medical): No  Physical Activity: Insufficiently Active (08/05/2024)   Exercise Vital Sign    Days of Exercise per Week: 3 days    Minutes of Exercise per Session: 30 min  Stress: No Stress Concern Present (08/05/2024)   [employer] of Occupational Health - Occupational Stress Questionnaire    Feeling of Stress: Not at all  Social Connections: Moderately Integrated (08/05/2024)   Social Connection and Isolation Panel    Frequency of Communication with Friends and Family: Once a week    Frequency of Social Gatherings with Friends and Family: Twice a week    Attends Religious Services: More than 4 times per year    Active Member of [employer] or Organizations: Yes    Attends Banker Meetings: More than 4 times per year    Marital Status: Widowed  Intimate Partner Violence: Not At Risk (08/05/2024)   Epic    Fear of Current or Ex-Partner: No    Emotionally Abused: No    Physically Abused: No    Sexually Abused: No  Depression (PHQ2-9): Low Risk (09/30/2024)   Depression (PHQ2-9)    PHQ-2 Score: 0  Recent Concern: Depression (PHQ2-9) - Medium Risk (09/03/2024)   Depression (PHQ2-9)    PHQ-2 Score: 8  Alcohol Screen: Low Risk (08/05/2024)   Alcohol Screen    Last Alcohol Screening Score (AUDIT): 0  Housing: Unknown (08/05/2024)   Epic    Unable to Pay for Housing in the Last Year: No    Number of Times  Moved in the Last Year: Not on file    Homeless in  the Last Year: No  Utilities: Not At Risk (08/05/2024)   Epic    Threatened with loss of utilities: No  Health Literacy: Adequate Health Literacy (08/05/2024)   B1300 Health Literacy    Frequency of need for help with medical instructions: Never       Objective:  There were no vitals taken for this visit. Wt Readings from Last 3 Encounters:  10/06/24 146 lb (66.2 kg)  09/30/24 150 lb 3.2 oz (68.1 kg)  09/19/24 150 lb (68 kg)   BMI Readings from Last 3 Encounters:  10/06/24 18.75 kg/m  09/30/24 19.28 kg/m  09/19/24 19.26 kg/m   SpO2 Readings from Last 3 Encounters:  10/06/24 97%  09/30/24 96%  09/09/24 95%    Physical Exam Constitutional:      Appearance: Normal appearance.  HENT:     Head: Normocephalic.  Eyes:     Extraocular Movements: Extraocular movements intact.     Pupils: Pupils are equal, round, and reactive to light.  Cardiovascular:     Rate and Rhythm: Normal rate and regular rhythm.  Pulmonary:     Effort: Pulmonary effort is normal.     Comments: Crackles present on the bases, no wheezing Musculoskeletal:     Comments: Better mobility, he is using a cane this time. Prior he was using a rollator. No edema  Skin:    General: Skin is warm and dry.  Neurological:     General: No focal deficit present.     Mental Status: He is alert and oriented to person, place, and time.    TTE 07/30/2024 EF 60-65%. No WMA. Mild LV hypertrophy.  RV fx and size are normal. Rvsp 25.5 Mild to moderate aortic valve stenosis.  CXR 09/11/2024 1. No acute cardiopulmonary abnormality. 2. Chronic interstitial lung disease. 3. Apical lung scarring.   CT chest 2023 COPD. There are patchy linear densities and increased interstitial markings in both lungs appear stable. There are no new infiltrates or new nodules. Coronary artery disease. There is ectasia of ascending thoracic aorta measuring 3.8 cm. Overall, no significant interval changes are noted.  CT chest  09/2024 Lower lobe predominant pulmonary fibrotic findings and diffuse hyperattenuation of the liver, suggestive of amiodarone  toxicity. Other causes of nonspecific pulmonary fibrosis including pulmonary eosinophilia are in differential diagnosis. Correlate with patient's history.  PFTs 2021: Normal FVC, FEV1, F/F, TLC, DLCO. 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 His dyspnea has improved, now using a cane and able to walk and dance. His bronchitis resolved with doxycycline  and prednisone . Amiodarone  was stopped. His CT scan showed progressive fibrosis, compared to 2023. His PFT in 2021 was normal. 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  He is improving without prednisone , I prefer conservative management given the multiple side effects of prednisone  at his age.  Follow up in 6 months.  I personally spent a total of  25 minutes in the care of the patient today including preparing to see the patient, getting/reviewing separately obtained history, performing a medically appropriate exam/evaluation, counseling and educating, placing orders, independently interpreting results, and communicating results.    Return in about 6 months (around 06/03/2025).    Marny Patch, MD Pulmonary and Critical Care Medicine [facility] "

## 2024-12-04 ENCOUNTER — Ambulatory Visit

## 2024-12-04 VITALS — BP 128/62 | HR 66 | Ht 74.0 in | Wt 144.0 lb

## 2024-12-04 DIAGNOSIS — J849 Interstitial pulmonary disease, unspecified: Secondary | ICD-10-CM | POA: Diagnosis not present

## 2024-12-04 DIAGNOSIS — T462X4D Poisoning by other antidysrhythmic drugs, undetermined, subsequent encounter: Secondary | ICD-10-CM

## 2024-12-04 DIAGNOSIS — Z87891 Personal history of nicotine dependence: Secondary | ICD-10-CM

## 2024-12-04 NOTE — Patient Instructions (Signed)
 Dear

## 2024-12-11 ENCOUNTER — Encounter: Payer: Self-pay | Admitting: Podiatry

## 2024-12-11 ENCOUNTER — Ambulatory Visit: Admitting: Podiatry

## 2024-12-11 DIAGNOSIS — L84 Corns and callosities: Secondary | ICD-10-CM | POA: Diagnosis not present

## 2024-12-11 DIAGNOSIS — M79675 Pain in left toe(s): Secondary | ICD-10-CM | POA: Diagnosis not present

## 2024-12-11 DIAGNOSIS — B351 Tinea unguium: Secondary | ICD-10-CM | POA: Diagnosis not present

## 2024-12-11 DIAGNOSIS — M79674 Pain in right toe(s): Secondary | ICD-10-CM

## 2024-12-11 DIAGNOSIS — Z7901 Long term (current) use of anticoagulants: Secondary | ICD-10-CM | POA: Diagnosis not present

## 2024-12-11 NOTE — Progress Notes (Signed)
 "  Subjective:  Patient ID: [name], male    DOB: May 22, 1930,   MRN: [id number]  Chief Complaint  Patient presents with   Nail Problem    This is a check-up, follow-up.    89 y.o. male presents for concern of callus on both feet and  of thickened elongated and painful nails that are difficult to trim. Requesting to have them trimmed today. He is on eliquis  and at risk for foot care  PCP:  Amon Aloysius BRAVO, MD    . Denies any other pedal complaints. Denies n/v/f/c.   Past Medical History:  Diagnosis Date   Anxiety    Atrial fibrillation (HCC)    Blood transfusion without reported diagnosis    CAD    a. 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 // Myoview  9/22: EF 57, normal perfusion; low risk // Lexiscan  MPI 09/19/24: no ischemia or infarction, EF 68, low risk   Cataract    Chronic abdominal pain    Chronic nausea    Coronary artery disease    Diverticulosis 2011   Diverticulitis 2004   Dyspepsia    Esophageal reflux    GERD (gastroesophageal reflux disease)    Headache    saw neuro 4-16, MRI done   History of echocardiogram    Echo 9/22: EF 60-65, no RWMA, normal diastolic function, GLS -24.9, normal RVSF, normal PASP, mild-moderate BAE, trivial MR, moderate MAC, moderate AV calcification, mild AI, mild aortic stenosis (mean gradient 5 mmHg, V-max 158 cm/s, DI 0.62), mild dilation of ascending aorta (40 mm)   HYPERPLASIA, PROSTATE NOS W/URINARY OBST/LUTS    IBS    INGUINAL HERNIA    Lichen planus 2006   Niels Hoover MD   LUMBAR RADICULOPATHY, RIGHT    Mixed hyperlipidemia    OSTEOPOROSIS    PONV (postoperative nausea and vomiting)    PREMATURE VENTRICULAR CONTRACTIONS    a. After CABG - was on Coumadin/Multaq  for a period of time. Coumadin discontinued 04/2010 after maintaining NSR.   UNSPECIFIED ANEMIA     Objective:  Physical Exam: Vascular: DP/PT pulses 2/4 bilateral. CFT <3 seconds. Absent  hair growth on digits. Edema noted to bilateral lower extremities. Xerosis noted bilaterally.  Skin. No lacerations or abrasions bilateral feet. Nails 1-5 bilateral  are thickened discolored and elongated with subungual debris. Hyperkeratotic lesion noted sub first metatarsal head bilateral and left fifth metatarsal head Musculoskeletal: MMT 5/5 bilateral lower extremities in DF, PF, Inversion and Eversion. Deceased ROM in DF of ankle joint.  Neurological: Sensation intact to light touch. Protective sensation intact bilateral.    Assessment:   1. Pain due to onychomycosis of toenails of both feet   2. Chronic anticoagulation   3. Pre-ulcerative calluses       Plan:  Patient was evaluated and treated and all questions answered. -Discussed and educated patient on foot care, especially with  regards to the vascular, neurological and musculoskeletal systems.  -Hyperkeratotic tissue noted sub first metatarsal bilateral and left fifth metatarsal head debrided without incident with chisel as courtesy. Discussed prevention techniques for the callused areas.  -Mechanically debrided all nails 1-5 bilateral using sterile nail nipper and filed with dremel without incident  -Answered all patient questions -Patient to return  in 3 months for at risk foot care -Patient advised to call the office if any problems or questions arise in the meantime.   Asberry Failing, DPM    "

## 2024-12-15 NOTE — Assessment & Plan Note (Signed)
 Mild to mod AS on last TTE in September 2025. ***

## 2024-12-15 NOTE — Progress Notes (Unsigned)
 " OFFICE NOTE:    Date:  12/16/2024  ID:  [name], DOB 1929/12/16, MRN [id number] PCP: Amon Aloysius BRAVO, MD  [facility] Providers Cardiologist:  Maude Emmer, MD        Coronary artery disease  S/p CABG in 2011 Myoview  07/29/21: EF 57, normal perfusion, low risk Lexiscan  MPI 09/19/24: no ischemia or infarction, EF 68, low risk  Paroxysmal atrial fibrillation Dronedarone  DC'd secondary to GI side effects; Not on beta-blocker due to bradycardia Anticoagulation: Apixaban  Monitor 07/2021: NSR, ATach longest 13 beats Monitor 06/2023: 100% AFib>>Amiodarone  S/p DCCV 08/2023  Amiodarone  DC'd due to pulmonary toxicity (eval by Pulmonology 09/2024) Aortic stenosis TTE 08/30/15: EF 55-60, no RWMA, GR 1 DD, trivial AI, mild RAE  TTE 07/27/23: EF 60-65, no RWMA, mild LVH, NL RVSF, mod BAE, mild MR, mild AI, asc Aorta 40 mm, RAP 3, AV calcified but no AS by doppler TTE 07/30/24: EF 60-65, no RWMA, mild LVH, NL RVSF, NL PASP, RVSP 25.5, mild MR, trivial AI, mild to mod AS (mean 15, Vmax 237 cm/s, DI 0.33 Hyperlipidemia Ascending aorta dilation  CT 05/18/22: ascending aorta ectasia, 3.8 cm  TTE 06/2023: 40 mm  Osteoporosis Elevated PSA GERD         Discussed the use of AI scribe software for clinical note transcription with the patient, who gave verbal consent to proceed. History of Present Illness [name] is a 89 y.o. male for follow up of AFib, CAD, AS. Last seen in 08/2024. He noted fatigue, shortness of breath. NT Pro BNP was mildly elevated but within range for his age. I did not think volume excess was the cause of his symptoms. K+ was elevated. Repeat was better but still elevated. Pt was asked to limit dietary K+. Another follow up showed normal K+. TSH was normal. CXR showed chronic interstitial lung disease and apical scarring. Nuclear stress test was normal. I referred him to Pulmonology. Dr. Adrien was concerned he may have amio lung toxicity. We stopped his Amiodarone . High Res CT was  suggestive of amiodarone  toxicity. Pt was treated with Doxycycline  for 1 week and a prednisone  taper. When last seen by Pulmonology 12/04/24, he had improved symptoms.   Since discontinuing the Amiodarone , his symptoms of dyspnea and low energy have improved. He also experienced nausea while on amiodarone , which has resolved since stopping the drug. He is currently feeling well and in normal rhythm. He describes experiencing leg pain, with his legs feeling heavy and painful to touch, and burning and tingling sensations extending to his feet. He has a history of arthritis in his hip, for which he received two cortisone injections six months apart, which has helped his pain. He experiences occasional heartburn, for which he takes antacids. His appetite has improved since stopping amiodarone , and he is trying to gain weight. No chest pain, pressure, or syncope. No current nausea. No recent melena or bleeding.    ROS-See HPI     Studies Reviewed:        Results Labs Total cholesterol (07/15/2024): 118 HDL (07/15/2024): 68.4 LDL (07/15/2024): 29 Triglycerides (07/15/2024): 102 Hemoglobin (10/06/2024): 13.6 Creatinine (10/06/2024): 0.96 Potassium (09/22/2024): 4.8 ALT (10/06/2024): 26 TSH (09/09/2024): 3.88   Risk Assessment/Calculations: CHA2DS2-VASc Score = 3   This indicates a 3.2% annual risk of stroke. The patient's score is based upon: CHF History: 0 HTN History: 0 Diabetes History: 0 Stroke History: 0 Vascular Disease History: 1 Age Score: 2 Gender Score: 0  Physical Exam:  VS:  BP 136/60   Pulse 61   Ht 6' 2 (1.88 m)   Wt 146 lb 12.8 oz (66.6 kg)   SpO2 96%   BMI 18.85 kg/m        Wt Readings from Last 3 Encounters:  12/16/24 146 lb 12.8 oz (66.6 kg)  12/04/24 144 lb (65.3 kg)  10/06/24 146 lb (66.2 kg)    Constitutional:      Appearance: Healthy appearance. Not in distress.  Neck:     Vascular: No JVR.  Pulmonary:     Breath sounds: Normal breath  sounds. No wheezing. No rales.  Cardiovascular:     Normal rate. Regular rhythm.     Murmurs: There is a grade 2/6 crescendo-decrescendo systolic murmur at the URSB.  Edema:    Peripheral edema absent.  Abdominal:     Palpations: Abdomen is soft.       Assessment and Plan:    Assessment & Plan Coronary artery disease involving coronary bypass graft of native heart without angina pectoris He is status post CABG in 2011. Low risk stress test in 2022. Recent stress test low risk. No chest symptoms suggestive of angina. - Continue Rosuvastatin  10 mg daily Persistent atrial fibrillation (HCC) Amiodarone  pulmonary toxicity He required Amiodarone  in the past to restore NSR. Unfortunately, he recently had evidence of amiodarone  lung toxicity and Amiodarone  was DC'd. Appreciate Dr. Margaretann care of this patient. He continues follow up with Pulmonology. His energy level and shortness of breath have improved since stopping Amiodarone . Symptoms of nausea have resolved since stopping amiodarone  as well. We discussed potential for recurrent atrial fibrillation and options for rate control versus rhythm control (consider dofetilide). He is tolerating anticoagulation well. - Continue Apixaban  5 mg twice daily - Monitor for recurrence of atrial fibrillation Ascending aorta dilation An echocardiogram in August 2024 showed a 40mm dilation.  Aorta size was normal on most recent echocardiogram. - He is pending follow-up echocardiogram in September 2026 Nonrheumatic aortic valve stenosis Mild to mod AS on last TTE in September 2025.  - He is pending follow-up echocardiogram in September 2026 Neuropathy Bilateral leg pain with symptoms consistent with peripheral neuropathy. I offered referral to neurology. He prefers to discuss further management with PCP.         Dispo:  Return in about 8 months (around 08/16/2025) for Routine Follow Up, w/ Dr. Delford.  Signed, Glendia Ferrier, PA-C   "

## 2024-12-15 NOTE — Assessment & Plan Note (Signed)
 He is status post CABG in 2011. Low risk stress test in 2022. Recent stress test low risk. No chest symptoms suggestive of angina. - Continue Rosuvastatin  10 mg daily

## 2024-12-15 NOTE — Assessment & Plan Note (Signed)
 He required Amiodarone  in the past to restore NSR. Unfortunately, he recently had evidence of amiodarone  lung toxicity and Amiodarone  was DC'd. Appreciate Dr. Margaretann care of this patient. He continues follow up with Pulmonology.***

## 2024-12-15 NOTE — Assessment & Plan Note (Signed)
 An echocardiogram in August 2024 showed a 40mm dilation.  Aorta size was normal on most recent echocardiogram.***

## 2024-12-16 ENCOUNTER — Ambulatory Visit: Attending: Physician Assistant | Admitting: Physician Assistant

## 2024-12-16 ENCOUNTER — Encounter: Payer: Self-pay | Admitting: Physician Assistant

## 2024-12-16 VITALS — BP 136/60 | HR 61 | Ht 74.0 in | Wt 146.8 lb

## 2024-12-16 DIAGNOSIS — I25810 Atherosclerosis of coronary artery bypass graft(s) without angina pectoris: Secondary | ICD-10-CM | POA: Diagnosis not present

## 2024-12-16 DIAGNOSIS — I77810 Thoracic aortic ectasia: Secondary | ICD-10-CM | POA: Diagnosis not present

## 2024-12-16 DIAGNOSIS — I4819 Other persistent atrial fibrillation: Secondary | ICD-10-CM | POA: Diagnosis not present

## 2024-12-16 DIAGNOSIS — T462X5D Adverse effect of other antidysrhythmic drugs, subsequent encounter: Secondary | ICD-10-CM | POA: Diagnosis not present

## 2024-12-16 DIAGNOSIS — I350 Nonrheumatic aortic (valve) stenosis: Secondary | ICD-10-CM | POA: Diagnosis not present

## 2024-12-16 DIAGNOSIS — G629 Polyneuropathy, unspecified: Secondary | ICD-10-CM | POA: Diagnosis not present

## 2024-12-16 DIAGNOSIS — J984 Other disorders of lung: Secondary | ICD-10-CM | POA: Diagnosis not present

## 2024-12-16 NOTE — Patient Instructions (Signed)
 Medication Instructions:  No medication changes were made during today's visit.  *If you need a refill on your cardiac medications before your next appointment, please call your pharmacy*  Lab Work: No labs were ordered during today's visit.  If you have labs (blood work) drawn today and your tests are completely normal, you will receive your results only by: MyChart Message (if you have MyChart) OR A paper copy in the mail If you have any lab test that is abnormal or we need to change your treatment, we will call you to review the results.  Testing/Procedures: Your physician has requested that you have an echocardiogram in September 2026. Echocardiography is a painless test that uses sound waves to create images of your heart. It provides your doctor with information about the size and shape of your heart and how well your hearts chambers and valves are working. This procedure takes approximately one hour. There are no restrictions for this procedure.   Please do NOT wear cologne, perfume, aftershave, or lotions (deodorant is allowed).   Please arrive 15 minutes prior to your appointment time.   Follow-Up: At [facility], you and your health needs are our priority.  As part of our continuing mission to provide you with exceptional heart care, our providers are all part of one team.  This team includes your primary Cardiologist (physician) and Advanced Practice Providers or APPs (Physician Assistants and Nurse Practitioners) who all work together to provide you with the care you need, when you need it.  Your next appointment:  after echo in September     Provider:   Maude Emmer, MD    We recommend signing up for the patient portal called MyChart.  Sign up information is provided on this After Visit Summary.  MyChart is used to connect with patients for Virtual Visits (Telemedicine).  Patients are able to view lab/test results, encounter notes, upcoming appointments, etc.   Non-urgent messages can be sent to your provider as well.   To learn more about what you can do with MyChart, go to [email or web address].   Other Instructions

## 2024-12-17 ENCOUNTER — Ambulatory Visit: Payer: Medicare Other | Admitting: Internal Medicine

## 2024-12-17 ENCOUNTER — Other Ambulatory Visit

## 2024-12-17 ENCOUNTER — Encounter: Payer: Self-pay | Admitting: Internal Medicine

## 2024-12-17 VITALS — BP 120/80 | HR 67 | Ht 74.0 in | Wt 146.8 lb

## 2024-12-17 DIAGNOSIS — M810 Age-related osteoporosis without current pathological fracture: Secondary | ICD-10-CM

## 2024-12-17 NOTE — Patient Instructions (Addendum)
Please stop at the lab.  Continue vitamin D 1000 units daily and the multivitamin.  Please call and schedule bone density scan at the [facility] [place] Office: [phone].   Please come back for a follow-up appointment in 1 year. 

## 2024-12-17 NOTE — Progress Notes (Signed)
 Patient ID: [name], male   DOB: 1930-03-08, 89 y.o.   MRN: [id number]   HPI  [name] is a 89 y.o.-year-old pleasant male, initially referred by his PCP, Dr. Amon, returning for follow-up osteoporosis.  Last visit 1 year ago.  Interim history: No falls or fractures since last visit. He had significant cervical spondylosis. He continues to go to [facility] (Dr. Daldorf).   In the last year, he developed severe pain in L hip - had to have steroid injections and was in PT >> this initially resolved but recurred after 6 mo >> he got another injection and was in PT again. Pain improved but he feels out of balance  - uses a cane now but up to 1 week ago he was using a sit down walker. He has occasional heartburn - takes Pepcid  complete (very seldom). He also has SOB and decreased energy. He was on Amiodarone  >> found to have lung changes >> stopped Amiodarone  and started Doxycycline  and Prednisone . He now feels better.  Reviewed history: He was diagnosed with osteoporosis in 2008  I first saw the patient in 89 and at that time I suggested either Tymlos /Forteo or Romosozumab Secretary/administrator).   He decided against these medications >>  restarted Prolia . He felt better after starting this, with less joint pain and more energy.  Reviewed previous DXA scan reports: 02/12/2023([place])  Lumbar spine L1-L4 (L3) Femoral neck (FN)  T-score -1.1 LFN: -2.3  Change in BMD from previous DXA test (%) -2.1% -4.6%  (*) statistically significant  01/31/2021 ([place]) Lumbar spine L1-L4 (L3) Femoral neck (FN)  T-score -0.9 LFN: -2.0  Change in BMD from previous DXA test (%) + 2.5% +11.1%  (*) statistically significant  01/22/2019 ([place]) Lumbar spine L1-L4 Femoral neck (FN)  T-score -2.2 LFN: -3.0  Change in BMD from previous DXA test (%) +1.4% -8.0%*  (*) statistically significant  Date L1-L4 T score FN T score  10/14/2015 ([place]) -2.3 LFN: -2.6  10/04/2007 -2.5 RFN: -2.8 LFN: -2.3    Reviewed previous fractures: - vertebral compression fractures per review of the abd. CT report from 08/16/2018: moderate T8, mild T9, T10, moderate T12 compression fractures. He feels they developed after lifting heavy objects. - Right hip fracture 03/2008: There is a spiral fracture of the proximal aspect of the right femur.  This begins in the intertrochanteric region and extends into the proximal femur.  There is a avulsion of the lesser trochanter.  The femoral head is located within the acetabulum. Degenerative changes are noted in the lower spine.  Bones appear Osteopenic.  Osteopenia was detected on x ray of sacrum/coccyx 01/14/2016.  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  After last visit I gave him a prescription for physical therapy (03/2020) at [facility] rehab. He had PT after Covid19 >> now exercises by himself. He uses the stepper, strength machines; also balance and mobility class.  He does not take high vitamin A doses.  He has a family history of osteoporosis in one of his sisters and possibly also in his mother.  No consistent history of hyper or  hypocalcemia or hyperparathyroidism.  No history of kidney stones. Lab Results  Component Value Date   CALCIUM  9.4 10/06/2024   CALCIUM  8.8 09/22/2024   CALCIUM  9.4 09/09/2024   CALCIUM  9.1 08/12/2024   CALCIUM  9.4 07/18/2024   CALCIUM  9.5 07/15/2024   CALCIUM  9.9 03/04/2024   CALCIUM  9.6 08/30/2023   CALCIUM  10.0 08/28/2023   CALCIUM  9.2 07/06/2023   No history of thyrotoxicosis: Lab Results  Component Value Date   TSH 3.880 09/09/2024   TSH 3.29 03/04/2024   TSH 3.370 07/09/2023   TSH 2.66 05/03/2022   TSH 3.31 10/19/2021   No CKD. Last BUN/Cr: Lab Results  Component Value Date   BUN 25 (H) 10/06/2024   CREATININE 0.96 10/06/2024   H/o CABG 2011.  He also has a history of A. fib. He had palpitations in 07/2021 - PVCs.He had extensive cardiac investigation >> negative. HHe had cardioversion 08/2023 >> this worked well for him. Covid 19 06/2022 >> nausea >> lost 15 lbs in 1 year. He gained them back.  ROS: + Joint aches  I reviewed pt's medications, allergies, PMH, social hx, family hx, and changes were documented in the history of present illness. Otherwise, unchanged from my initial visit note.  Past Medical History:  Diagnosis Date   Anxiety    Atrial fibrillation (HCC)    Blood transfusion without reported diagnosis    CAD    a. 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 // Myoview  9/22: EF 57, normal perfusion; low risk // Lexiscan  MPI 09/19/24: no ischemia or infarction, EF 68, low risk   Cataract    Chronic abdominal pain    Chronic nausea    Coronary artery disease    Diverticulosis 2011   Diverticulitis 2004   Dyspepsia    Esophageal reflux    GERD (gastroesophageal reflux disease)    Headache    saw neuro 4-16, MRI done   History of echocardiogram    Echo 9/22: EF 60-65, no RWMA, normal diastolic function, GLS -24.9, normal RVSF, normal PASP, mild-moderate BAE, trivial MR,  moderate MAC, moderate AV calcification, mild AI, mild aortic stenosis (mean gradient 5 mmHg, V-max 158 cm/s, DI 0.62), mild dilation of ascending aorta (40 mm)   HYPERPLASIA, PROSTATE NOS W/URINARY OBST/LUTS    IBS    INGUINAL HERNIA    Lichen planus 2006   Niels Hoover MD   LUMBAR RADICULOPATHY, RIGHT    Mixed hyperlipidemia    OSTEOPOROSIS    PONV (postoperative nausea and vomiting)    PREMATURE VENTRICULAR CONTRACTIONS    a. After CABG - was on Coumadin/Multaq  for a period of time. Coumadin discontinued 04/2010 after maintaining NSR.   UNSPECIFIED ANEMIA    Past Surgical History:  Procedure Laterality Date   BIOPSY  09/10/2018   Procedure: BIOPSY;  Surgeon: Albertus Gordy HERO, MD;  Location: [facility];  Service: Gastroenterology;;   CARDIOVERSION N/A 08/31/2023   Procedure: CARDIOVERSION;  Surgeon: Michele Richardson, DO;  Location:  [facility];  Service: Cardiovascular;  Laterality: N/A;   CATARACT EXTRACTION Right 10/06/2013   CORONARY ARTERY BYPASS GRAFT     2 VD;anomalous RCA;post op compliacted by AF   ESOPHAGOGASTRODUODENOSCOPY (EGD) WITH PROPOFOL  N/A 09/10/2018   Procedure: ESOPHAGOGASTRODUODENOSCOPY (EGD) WITH PROPOFOL ;  Surgeon: Albertus Gordy HERO, MD;  Location: [facility];  Service: Gastroenterology;  Laterality: N/A;   HIP FRACTURE SURGERY Right 03/2007   Dr. Rollie   INGUINAL HERNIA REPAIR Left 2012   Dr Vanderbilt   KNEE SURGERY Right 1977   repair   PROSTATE BIOPSY  09/19/2013   [facility]   Social History   Socioeconomic History   Marital status: Legally Separated    Spouse name: Not on file   Number of children: 0   Years of education: Not on file   Highest education level: Not on file  Occupational History   Occupation: retired    Associate Professor: RETIRED  Tobacco Use   Smoking status: Former    Current packs/day: 0.00    Types: Cigarettes    Quit date: 11/27/1962    Years since quitting: 62.0   Smokeless tobacco: Never  Vaping Use   Vaping status:  Never Used  Substance and Sexual Activity   Alcohol use: Not Currently    Alcohol/week: 0.0 standard drinks of alcohol    Comment: quit drinking    Drug use: No   Sexual activity: Not Currently  Other Topics Concern   Not on file  Social History Narrative   Lives at [facility] in independent living.     Lost wife 06-2016.     Has no children.  Family: nephews-nices in [place]   Retired from [employer].     Still drives sometimes.    Social Drivers of Health   Tobacco Use: Medium Risk (12/16/2024)   Patient History    Smoking Tobacco Use: Former    Smokeless Tobacco Use: Never    Passive Exposure: Not on file  Financial Resource Strain: Low Risk (08/05/2024)   Overall Financial Resource Strain (CARDIA)    Difficulty of Paying Living Expenses: Not hard at all  Food Insecurity: No Food Insecurity (08/05/2024)   Epic    Worried About Programme Researcher, Broadcasting/film/video in the Last Year: Never true    Ran Out of Food in the Last Year: Never true  Transportation Needs: No Transportation Needs (08/05/2024)   Epic    Lack of Transportation (Medical): No    Lack of Transportation (Non-Medical): No  Physical Activity: Insufficiently Active (08/05/2024)   Exercise Vital Sign    Days of Exercise per Week: 3 days    Minutes of Exercise per Session: 30 min  Stress: No Stress Concern Present (08/05/2024)   [employer] of Occupational Health - Occupational Stress Questionnaire    Feeling of Stress: Not at all  Social Connections: Moderately Integrated (08/05/2024)   Social Connection and Isolation Panel    Frequency of Communication with Friends and Family: Once a week    Frequency of Social Gatherings with Friends and Family: Twice a week    Attends Religious Services: More than 4 times per year    Active Member of [employer] or Organizations: Yes    Attends Banker Meetings: More than 4 times per year    Marital Status: Widowed  Intimate Partner Violence: Not At Risk (08/05/2024)   Epic     Fear of Current or Ex-Partner: No    Emotionally Abused: No  Physically Abused: No    Sexually Abused: No  Depression (PHQ2-9): Low Risk (09/30/2024)   Depression (PHQ2-9)    PHQ-2 Score: 0  Recent Concern: Depression (PHQ2-9) - Medium Risk (09/03/2024)   Depression (PHQ2-9)    PHQ-2 Score: 8  Alcohol Screen: Low Risk (08/05/2024)   Alcohol Screen    Last Alcohol Screening Score (AUDIT): 0  Housing: Unknown (08/05/2024)   Epic    Unable to Pay for Housing in the Last Year: No    Number of Times Moved in the Last Year: Not on file    Homeless in the Last Year: No  Utilities: Not At Risk (08/05/2024)   Epic    Threatened with loss of utilities: No  Health Literacy: Adequate Health Literacy (08/05/2024)   B1300 Health Literacy    Frequency of need for help with medical instructions: Never   Current Outpatient Medications on File Prior to Visit  Medication Sig Dispense Refill   acetaminophen  (TYLENOL ) 650 MG CR tablet Take 325-650 mg by mouth every 8 (eight) hours as needed for pain (Arthritis).     calcium  carbonate (TUMS - DOSED IN MG ELEMENTAL CALCIUM ) 500 MG chewable tablet Chew 500 mg by mouth daily.     Cholecalciferol  (VITAMIN D -3) 25 MCG (1000 UT) CAPS Take 1,000 Units by mouth daily with breakfast.     denosumab  (PROLIA ) 60 MG/ML SOSY injection Inject 60 mg into the skin every 6 (six) months.     docusate sodium  (COLACE) 100 MG capsule Take 1 capsule (100 mg total) by mouth 2 (two) times daily. 180 capsule 1   DULoxetine  (CYMBALTA ) 20 MG capsule Take 1 capsule (20 mg total) by mouth daily. 30 capsule 0   ELIQUIS  5 MG TABS tablet TAKE 1 TABLET BY MOUTH TWICE DAILY 60 tablet 5   famotidine -calcium  carbonate-magnesium hydroxide (PEPCID  COMPLETE) 10-800-165 MG chewable tablet Chew 1 tablet by mouth daily as needed (indigestion).     fluticasone  (FLONASE ) 50 MCG/ACT nasal spray Place 2 sprays into both nostrils daily. 16 g 1   folic acid  (FOLVITE ) 1 MG tablet TAKE 1 TABLET (1 MG TOTAL) BY  MOUTH DAILY. 90 tablet 0   Glycerin-Hypromellose-PEG 400 (DRY EYE RELIEF DROPS) 0.2-0.2-1 % SOLN Place 1 drop into both eyes every morning. Lubricating eyes     Multiple Vitamin (MULITIVITAMIN WITH MINERALS) TABS Take 1 tablet by mouth daily.     nitroGLYCERIN  (NITROSTAT ) 0.4 MG SL tablet DISSOLVE 1 TABLET UNDER TONGUE EVERY 5 MINUTES FOR UP TO 3 DOSES AS NEEDED FOR CHEST PAIN 25 tablet 9   rosuvastatin  (CRESTOR ) 10 MG tablet Take 1 tablet (10 mg total) by mouth daily. 90 tablet 3   No current facility-administered medications on file prior to visit.   Allergies  Allergen Reactions   Amiodarone  Shortness Of Breath    Lung Toxicity   Amoxicillin  Diarrhea, Nausea And Vomiting and Other (See Comments)    Patient questions this   Zoloft  [Sertraline  Hcl]     Patient didn't like the way it made him feel   Remeron  [Mirtazapine ] Other (See Comments)    Caused excessive drowsiness   Family History  Problem Relation Age of Onset   Stroke Mother    Pancreatic cancer Father        Deceased, 71   Breast cancer Sister        Deceased   Heart disease Sister 12   Stroke Maternal Aunt    Colon cancer Neg Hx    Prostate cancer Neg Hx  Esophageal cancer Neg Hx    Rectal cancer Neg Hx    Stomach cancer Neg Hx    PE: BP 120/80   Pulse 67   Ht 6' 2 (1.88 m)   Wt 146 lb 12.8 oz (66.6 kg)   SpO2 99%   BMI 18.85 kg/m  Wt Readings from Last 20 Encounters:  12/17/24 146 lb 12.8 oz (66.6 kg)  12/16/24 146 lb 12.8 oz (66.6 kg)  12/04/24 144 lb (65.3 kg)  10/06/24 146 lb (66.2 kg)  09/30/24 150 lb 3.2 oz (68.1 kg)  09/19/24 150 lb (68 kg)  09/09/24 150 lb (68 kg)  09/03/24 150 lb 9.6 oz (68.3 kg)  08/12/24 153 lb 12.8 oz (69.8 kg)  08/05/24 153 lb (69.4 kg)  07/15/24 153 lb 8 oz (69.6 kg)  03/12/24 153 lb (69.4 kg)  03/04/24 153 lb 8 oz (69.6 kg)  01/22/24 153 lb 10.6 oz (69.7 kg)  12/20/23 153 lb 9.6 oz (69.7 kg)  12/03/23 155 lb 8 oz (70.5 kg)  09/14/23 153 lb 6.4 oz (69.6 kg)   08/31/23 153 lb (69.4 kg)  08/28/23 153 lb 12.8 oz (69.8 kg)  08/01/23 154 lb (69.9 kg)   Constitutional: thin, in NAD, uses a cane Eyes:  EOMI, no exophthalmos ENT: no neck masses, no cervical lymphadenopathy Cardiovascular: RRR, No MRG Respiratory: CTA B Musculoskeletal: no deformities Skin:no rashes Neurological: no tremor with outstretched hands  Assessment: 1. Osteoporosis   Plan: 1. Osteoporosis - Likely age-related and he also has a family history of osteoporosis - He has an increased risk for fracture due to previous history: Lumbar compression fracture developed after lifting buckets of water for his garden.  He also has a classic osteoporosis right hip fracture, developed while on Fosamax .  He has T-score lower than -2.5, which predisposes him for fractures. -In the past, after his right hip fracture, he was advised to stop Fosamax .  He did restart it afterwards but developed jaw degeneration (most likely not ONJ- we looked together at ONJ pictures online and he adamantly denied having had a similar picture, but I do not have official records).  Per his description, his jaw was locking up when on the medication.  We discussed that this may have been due to osteoarthritis, but likely not due to the medication.  We started Prolia  afterwards and he again developed jaw degeneration and stopped.  When I first saw him, we discussed about either restarting Prolia  or using an anabolic agent, but after our discussion, we decided to try Prolia  again.  He did not have side effects from this anymore.  He is aware about osteonecrosis of the jaw as a possible side effect, but he denies any symptoms of this and he has regular visits with his dentist.  Also, he has no thigh/hip pain.  In fact, after starting Prolia  again, he started to feel better, with less joint pains. -He had a bone density scan in 2022 showing a bone mineral density significantly improved at the spine and right femoral neck.   His L3 vertebra was not analyzed due to degenerative changes.  Before last visit, he had another bone density in 01/2023 which showed slight decrease in T-scores but the decrease did not reach statistical significance.  We decided to continue with Prolia .  He is not missing doses.  He is aware of not leaving more than 6 months between injections. - Last 2 Prolia  doses were 04/29/2024 and 11/01/2024-he is due for another injection in 4  mo - We can continue with Prolia  for 10 years or more, if needed - He also continues vitamin D  1000 units daily and also the amount and multivitamins.  He also takes 1 Tums tablet a day - At today's visit, he has a question about starting on calcium  supplementation.  He was advised by his friends that calcium  carbonate is not sufficient as a calcium  supplement.  We discussed that ideally he should get 1000-1200 mg of calcium  from the diet daily but if he does not feel that he can get this amount from the diet, to supplement.  Usually calcium  carbonate taken with a meal is sufficient for supplementation in patients that are not taking PPIs.  He is not on a PPI and he only takes an H2 blocker very seldom and on an as needed basis.  Therefore, other than recommended to take his Tums tablet (which she was taking for the last 30 years) with a meal, I did not recommend a change in his calcium  dosing. - No falls or fractures since last visit.  He is aware about fall precautions.  He uses a cane but did have to use a walker until a week ago - We will check his vitamin D  level today - I will see him back in 1 year  Orders Placed This Encounter  Procedures   DG Bone Density   VITAMIN D  25 Hydroxy (Vit-D Deficiency, Fractures)   Will proceed with the Prolia  injection depending on the vitamin D  level above.  Lela Fendt, MD PhD [facility]

## 2024-12-18 ENCOUNTER — Ambulatory Visit: Payer: Self-pay | Admitting: Internal Medicine

## 2024-12-18 LAB — VITAMIN D 25 HYDROXY (VIT D DEFICIENCY, FRACTURES): Vit D, 25-Hydroxy: 51 ng/mL (ref 30–100)

## 2025-01-16 ENCOUNTER — Ambulatory Visit: Admitting: Internal Medicine

## 2025-02-17 ENCOUNTER — Other Ambulatory Visit

## 2025-03-12 ENCOUNTER — Ambulatory Visit: Admitting: Podiatry

## 2025-05-20 ENCOUNTER — Ambulatory Visit

## 2025-07-28 ENCOUNTER — Ambulatory Visit (HOSPITAL_COMMUNITY)

## 2025-08-18 ENCOUNTER — Ambulatory Visit

## 2025-12-16 ENCOUNTER — Ambulatory Visit: Admitting: Internal Medicine
